# Patient Record
Sex: Female | Born: 1940
Health system: Southern US, Community
[De-identification: ages and names within clinical notes are randomized; demographics above are authoritative.]

## PROBLEM LIST (undated history)

## (undated) DIAGNOSIS — M4800 Spinal stenosis, site unspecified: Secondary | ICD-10-CM

## (undated) DIAGNOSIS — K219 Gastro-esophageal reflux disease without esophagitis: Secondary | ICD-10-CM

## (undated) DIAGNOSIS — I5032 Chronic diastolic (congestive) heart failure: Secondary | ICD-10-CM

## (undated) DIAGNOSIS — E785 Hyperlipidemia, unspecified: Secondary | ICD-10-CM

## (undated) DIAGNOSIS — C50919 Malignant neoplasm of unspecified site of unspecified female breast: Secondary | ICD-10-CM

## (undated) DIAGNOSIS — M1990 Unspecified osteoarthritis, unspecified site: Secondary | ICD-10-CM

## (undated) DIAGNOSIS — M109 Gout, unspecified: Secondary | ICD-10-CM

## (undated) DIAGNOSIS — E119 Type 2 diabetes mellitus without complications: Secondary | ICD-10-CM

## (undated) DIAGNOSIS — I10 Essential (primary) hypertension: Secondary | ICD-10-CM

## (undated) HISTORY — DX: Gastro-esophageal reflux disease without esophagitis: K21.9

## (undated) HISTORY — DX: Chronic diastolic (congestive) heart failure: I50.32

## (undated) HISTORY — PX: MASTECTOMY: SHX3

## (undated) HISTORY — DX: Hyperlipidemia, unspecified: E78.5

## (undated) HISTORY — PX: REPLACEMENT TOTAL KNEE: SUR1224

## (undated) HISTORY — PX: KNEE ARTHROSCOPY: SHX127

## (undated) HISTORY — PX: BREAST LUMPECTOMY: SHX2

## (undated) HISTORY — PX: ABDOMINAL HYSTERECTOMY: SHX81

## (undated) HISTORY — PX: BREAST BIOPSY: SHX20

## (undated) HISTORY — DX: Gout, unspecified: M10.9

## (undated) HISTORY — PX: ABDOMINAL SURGERY: SHX537

## (undated) HISTORY — PX: TUBAL LIGATION: SHX77

---

## 2005-11-30 DIAGNOSIS — C50919 Malignant neoplasm of unspecified site of unspecified female breast: Secondary | ICD-10-CM

## 2005-11-30 HISTORY — DX: Malignant neoplasm of unspecified site of unspecified female breast: C50.919

## 2005-11-30 LAB — HM COLONOSCOPY: HM Colonoscopy: 1

## 2010-09-17 ENCOUNTER — Emergency Department (HOSPITAL_BASED_OUTPATIENT_CLINIC_OR_DEPARTMENT_OTHER): Admission: EM | Admit: 2010-09-17 | Discharge: 2010-09-17 | Payer: Self-pay | Admitting: Emergency Medicine

## 2010-09-17 ENCOUNTER — Ambulatory Visit: Payer: Self-pay | Admitting: Diagnostic Radiology

## 2010-11-11 ENCOUNTER — Emergency Department (HOSPITAL_BASED_OUTPATIENT_CLINIC_OR_DEPARTMENT_OTHER)
Admission: EM | Admit: 2010-11-11 | Discharge: 2010-11-11 | Payer: Self-pay | Source: Home / Self Care | Admitting: Emergency Medicine

## 2011-02-10 LAB — URINALYSIS, ROUTINE W REFLEX MICROSCOPIC
Bilirubin Urine: NEGATIVE
Glucose, UA: NEGATIVE mg/dL
Hgb urine dipstick: NEGATIVE
Ketones, ur: NEGATIVE mg/dL
Nitrite: NEGATIVE
Protein, ur: NEGATIVE mg/dL
Specific Gravity, Urine: 1.006 (ref 1.005–1.030)
Urobilinogen, UA: 0.2 mg/dL (ref 0.0–1.0)
pH: 7 (ref 5.0–8.0)

## 2011-02-11 LAB — CBC
HCT: 41.3 % (ref 36.0–46.0)
Hemoglobin: 13.8 g/dL (ref 12.0–15.0)
MCH: 29.3 pg (ref 26.0–34.0)
MCHC: 33.5 g/dL (ref 30.0–36.0)
MCV: 87.3 fL (ref 78.0–100.0)
Platelets: 229 10*3/uL (ref 150–400)
RBC: 4.72 MIL/uL (ref 3.87–5.11)
RDW: 15.2 % (ref 11.5–15.5)
WBC: 10.1 10*3/uL (ref 4.0–10.5)

## 2011-02-11 LAB — GLUCOSE, CAPILLARY
Glucose-Capillary: 103 mg/dL — ABNORMAL HIGH (ref 70–99)
Glucose-Capillary: 68 mg/dL — ABNORMAL LOW (ref 70–99)

## 2011-02-11 LAB — D-DIMER, QUANTITATIVE: D-Dimer, Quant: 0.34 ug/mL-FEU (ref 0.00–0.48)

## 2011-02-11 LAB — DIFFERENTIAL
Basophils Absolute: 0.1 10*3/uL (ref 0.0–0.1)
Basophils Relative: 1 % (ref 0–1)
Eosinophils Absolute: 0.4 10*3/uL (ref 0.0–0.7)
Eosinophils Relative: 4 % (ref 0–5)
Lymphocytes Relative: 37 % (ref 12–46)
Lymphs Abs: 3.7 10*3/uL (ref 0.7–4.0)
Monocytes Absolute: 0.7 10*3/uL (ref 0.1–1.0)
Monocytes Relative: 7 % (ref 3–12)
Neutro Abs: 5.2 10*3/uL (ref 1.7–7.7)
Neutrophils Relative %: 51 % (ref 43–77)

## 2011-02-11 LAB — POCT CARDIAC MARKERS
CKMB, poc: <1 ng/mL — ABNORMAL LOW (ref 1.0–8.0)
CKMB, poc: <1 ng/mL — ABNORMAL LOW (ref 1.0–8.0)
Myoglobin, poc: 63.6 ng/mL (ref 12–200)
Myoglobin, poc: 67.7 ng/mL (ref 12–200)
Troponin i, poc: <0.05 ng/mL (ref 0.00–0.09)
Troponin i, poc: <0.05 ng/mL (ref 0.00–0.09)

## 2011-02-11 LAB — BASIC METABOLIC PANEL
BUN: 9 mg/dL (ref 6–23)
CO2: 29 mEq/L (ref 19–32)
Calcium: 9.9 mg/dL (ref 8.4–10.5)
Chloride: 105 mEq/L (ref 96–112)
Creatinine, Ser: 0.9 mg/dL (ref 0.4–1.2)
GFR calc Af Amer: >60 mL/min (ref >60)
GFR calc non Af Amer: >60 mL/min (ref >60)
Glucose, Bld: 71 mg/dL (ref 70–99)
Potassium: 4.1 mEq/L (ref 3.5–5.1)
Sodium: 145 mEq/L (ref 135–145)

## 2011-08-24 DIAGNOSIS — M25559 Pain in unspecified hip: Secondary | ICD-10-CM | POA: Insufficient documentation

## 2011-10-05 DIAGNOSIS — D0511 Intraductal carcinoma in situ of right breast: Secondary | ICD-10-CM | POA: Insufficient documentation

## 2011-12-01 LAB — HM DEXA SCAN

## 2013-05-30 LAB — HM DIABETES EYE EXAM

## 2013-10-09 LAB — HM MAMMOGRAPHY

## 2013-12-13 ENCOUNTER — Emergency Department (HOSPITAL_BASED_OUTPATIENT_CLINIC_OR_DEPARTMENT_OTHER)
Admission: EM | Admit: 2013-12-13 | Discharge: 2013-12-13 | Disposition: A | Discharge status: Home / Self Care | Payer: Medicare HMO | Attending: Emergency Medicine | Admitting: Emergency Medicine

## 2013-12-13 ENCOUNTER — Emergency Department (HOSPITAL_BASED_OUTPATIENT_CLINIC_OR_DEPARTMENT_OTHER): Payer: Medicare HMO

## 2013-12-13 ENCOUNTER — Encounter (HOSPITAL_BASED_OUTPATIENT_CLINIC_OR_DEPARTMENT_OTHER): Payer: Self-pay | Admitting: Emergency Medicine

## 2013-12-13 DIAGNOSIS — M129 Arthropathy, unspecified: Secondary | ICD-10-CM | POA: Insufficient documentation

## 2013-12-13 DIAGNOSIS — Z791 Long term (current) use of non-steroidal anti-inflammatories (NSAID): Secondary | ICD-10-CM | POA: Insufficient documentation

## 2013-12-13 DIAGNOSIS — M549 Dorsalgia, unspecified: Secondary | ICD-10-CM

## 2013-12-13 DIAGNOSIS — M5431 Sciatica, right side: Secondary | ICD-10-CM

## 2013-12-13 DIAGNOSIS — I10 Essential (primary) hypertension: Secondary | ICD-10-CM | POA: Insufficient documentation

## 2013-12-13 DIAGNOSIS — M7989 Other specified soft tissue disorders: Secondary | ICD-10-CM | POA: Insufficient documentation

## 2013-12-13 DIAGNOSIS — Z79899 Other long term (current) drug therapy: Secondary | ICD-10-CM | POA: Insufficient documentation

## 2013-12-13 DIAGNOSIS — M5430 Sciatica, unspecified side: Secondary | ICD-10-CM | POA: Insufficient documentation

## 2013-12-13 DIAGNOSIS — Z7982 Long term (current) use of aspirin: Secondary | ICD-10-CM | POA: Insufficient documentation

## 2013-12-13 DIAGNOSIS — Z853 Personal history of malignant neoplasm of breast: Secondary | ICD-10-CM | POA: Insufficient documentation

## 2013-12-13 DIAGNOSIS — E119 Type 2 diabetes mellitus without complications: Secondary | ICD-10-CM | POA: Insufficient documentation

## 2013-12-13 HISTORY — DX: Essential (primary) hypertension: I10

## 2013-12-13 HISTORY — DX: Malignant neoplasm of unspecified site of unspecified female breast: C50.919

## 2013-12-13 HISTORY — DX: Spinal stenosis, site unspecified: M48.00

## 2013-12-13 HISTORY — DX: Unspecified osteoarthritis, unspecified site: M19.90

## 2013-12-13 HISTORY — DX: Type 2 diabetes mellitus without complications: E11.9

## 2013-12-13 MED ORDER — OXYCODONE-ACETAMINOPHEN 5-325 MG PO TABS
2.0000 | ORAL_TABLET | Freq: Once | ORAL | Status: AC
  Administered 2013-12-13: 2 via ORAL
  Filled 2013-12-13: qty 2

## 2013-12-13 MED ORDER — OXYCODONE-ACETAMINOPHEN 5-325 MG PO TABS: 2.0000 | ORAL_TABLET | ORAL | Status: DC | PRN

## 2013-12-13 NOTE — ED Provider Notes (Signed)
CSN: [id number]     Arrival date & time 12/13/13  1631 History   First MD Initiated Contact with Patient 12/13/13 1727     Chief Complaint  Patient presents with  . Leg Pain   (Consider location/radiation/quality/duration/timing/severity/associated sxs/prior Treatment) Patient is a 73 y.o. female presenting with leg pain. The history is provided by the patient. No language interpreter was used.  Leg Pain Location:  Hip Injury: no   Hip location:  R hip Pain details:    Quality:  Aching   Radiates to:  Does not radiate   Severity:  Moderate   Onset quality:  Gradual   Duration:  1 month   Timing:  Constant   Progression:  Worsening Chronicity:  New Foreign body present:  No foreign bodies Relieved by:  Nothing Worsened by:  Nothing tried Associated symptoms: no back pain     Past Medical History  Diagnosis Date  . Hypertension   . Diabetes mellitus without complication   . Breast cancer   . Spinal stenosis   . Arthritis    Past Surgical History  Procedure Laterality Date  . Knee arthroscopy    . Abdominal hysterectomy    . Mastectomy      right side   No family history on file. History  Substance Use Topics  . Smoking status: Never Smoker   . Smokeless tobacco: Not on file  . Alcohol Use: No   OB History   Grav Para Term Preterm Abortions TAB SAB Ect Mult Living                 Review of Systems  Musculoskeletal: Positive for joint swelling. Negative for back pain.  All other systems reviewed and are negative.    Allergies  Sulfa antibiotics  Home Medications   Current Outpatient Rx  Name  Route  Sig  Dispense  Refill  . aspirin 81 MG tablet   Oral   Take 81 mg by mouth daily.         . benazepril (LOTENSIN) 20 MG tablet   Oral   Take 20 mg by mouth daily.         . cyclobenzaprine (FLEXERIL) 10 MG tablet   Oral   Take 10 mg by mouth 3 (three) times daily as needed for muscle spasms.         . hydrochlorothiazide (MICROZIDE) 12.5 MG  capsule   Oral   Take 12.5 mg by mouth daily.         Marland Kitchen HYDROcodone-acetaminophen (NORCO/VICODIN) 5-325 MG per tablet   Oral   Take 1 tablet by mouth every 6 (six) hours as needed for moderate pain.         . magnesium 30 MG tablet   Oral   Take 500 mg by mouth 2 (two) times daily.         . metFORMIN (GLUCOPHAGE) 500 MG tablet   Oral   Take 500 mg by mouth 2 (two) times daily with a meal.         . naproxen sodium (ANAPROX) 550 MG tablet   Oral   Take 550 mg by mouth 2 (two) times daily with a meal.         . omeprazole (PRILOSEC) 20 MG capsule   Oral   Take 20 mg by mouth daily.         Marland Kitchen oxyCODONE-acetaminophen (PERCOCET) 7.5-325 MG per tablet   Oral   Take 1 tablet by mouth every 4 (  four) hours as needed for pain.         . simvastatin (ZOCOR) 10 MG tablet   Oral   Take 10 mg by mouth daily.         . tamoxifen (NOLVADEX) 20 MG tablet   Oral   Take 20 mg by mouth daily.         . vitamin B-12 (CYANOCOBALAMIN) 100 MCG tablet   Oral   Take 100 mcg by mouth daily.          BP 162/66  Pulse 80  Temp(Src) 98.2 F (36.8 C) (Oral)  Resp 18  Ht 5\' 3"  (1.6 m)  Wt 200 lb (90.719 kg)  BMI 35.44 kg/m2  SpO2 100% Physical Exam  Nursing note and vitals reviewed. Constitutional: She appears well-developed and well-nourished.  HENT:  Head: Normocephalic and atraumatic.  Cardiovascular: Normal rate.   Pulmonary/Chest: Effort normal.  Abdominal: Soft.  Musculoskeletal: She exhibits tenderness.  Tender right hip and low back  Neurological: She is alert.  Skin: Skin is warm.  Psychiatric: She has a normal mood and affect.    ED Course  Procedures (including critical care time) Labs Review Labs Reviewed - No data to display Imaging Review Dg Lumbar Spine Complete  12/13/2013   CLINICAL DATA:  Low back pain which the patient dates to 11/10/2013. No recent injuries.  EXAM: LUMBAR SPINE - COMPLETE 4+ VIEW  COMPARISON:  None.  FINDINGS: Five non  rib-bearing lumbar vertebrae with anatomic alignment. Slight straightening of the usual lumbar lordosis. Severe disc space narrowing at L2-3, L3-4, L4-5 and L5-S1. Moderate disc space narrowing at L1-2. No fractures. Facet degenerative changes diffusely throughout the lumbar spine, worst at L5-S1. Visualized sacroiliac joints intact with mild degenerative changes.  IMPRESSION: No acute or subacute osseous abnormality. Degenerative disc disease at every lumbar level, severe at L2-3 through L5-S1 and moderate at L1-2. Diffuse facet degenerative changes.   Electronically Signed   By: Evangeline Dakin M.D.   On: 12/13/2013 18:33   Dg Hip Complete Right  12/13/2013   CLINICAL DATA:  Right hip pain.  No recent injuries.  EXAM: RIGHT HIP - COMPLETE 2+ VIEW  COMPARISON:  None.  FINDINGS: No evidence of acute or subacute fracture or dislocation. Mild protrusio deformity with mild joint space narrowing. Calcification adjacent to the superior rim of the acetabulum. No other intrinsic osseous abnormalities. Well preserved bone mineral density for age.  Included AP pelvis demonstrates symmetric mild protrusio deformity and joint space narrowing involving the contralateral left hip joint. Symphysis pubis intact. No pelvic fractures.  IMPRESSION: 1. No acute or subacute osseous abnormality. 2. Calcification adjacent to the superior rim of the acetabulum. Acetabular rim calcifications can be due to labral calcifications/ossification, acetabular rim stress fractures, or os acetabuli. Os acetabuli and acetabular rim stress fractures put the patient at increased risk for pincer type femoroacetabular impingement. 3. Mild protrusio deformity with mild joint space narrowing.   Electronically Signed   By: Evangeline Dakin M.D.   On: 12/13/2013 18:36    EKG Interpretation   None       MDM   1. Back pain   2. Sciatica of right side    [place], [place] 12/13/13 1955

## 2013-12-13 NOTE — ED Notes (Signed)
Patient has had pain since mid- Dec that starts in her right groin area and radiates down her right leg. Has seen her PCP

## 2013-12-13 NOTE — Discharge Instructions (Signed)
Sciatica °Sciatica is pain, weakness, numbness, or tingling along the path of the sciatic nerve. The nerve starts in the lower back and runs down the back of each leg. The nerve controls the muscles in the lower leg and in the back of the knee, while also providing sensation to the back of the thigh, lower leg, and the sole of your foot. Sciatica is a symptom of another medical condition. For instance, nerve damage or certain conditions, such as a herniated disk or bone spur on the spine, pinch or put pressure on the sciatic nerve. This causes the pain, weakness, or other sensations normally associated with sciatica. Generally, sciatica only affects one side of the body. °CAUSES  °· Herniated or slipped disc. °· Degenerative disk disease. °· A pain disorder involving the narrow muscle in the buttocks (piriformis syndrome). °· Pelvic injury or fracture. °· Pregnancy. °· Tumor (rare). °SYMPTOMS  °Symptoms can vary from mild to very severe. The symptoms usually travel from the low back to the buttocks and down the back of the leg. Symptoms can include: °· Mild tingling or dull aches in the lower back, leg, or hip. °· Numbness in the back of the calf or sole of the foot. °· Burning sensations in the lower back, leg, or hip. °· Sharp pains in the lower back, leg, or hip. °· Leg weakness. °· Severe back pain inhibiting movement. °These symptoms may get worse with coughing, sneezing, laughing, or prolonged sitting or standing. Also, being overweight may worsen symptoms. °DIAGNOSIS  °Your caregiver will perform a physical exam to look for common symptoms of sciatica. He or she may ask you to do certain movements or activities that would trigger sciatic nerve pain. Other tests may be performed to find the cause of the sciatica. These may include: °· Blood tests. °· X-rays. °· Imaging tests, such as an MRI or CT scan. °TREATMENT  °Treatment is directed at the cause of the sciatic pain. Sometimes, treatment is not necessary  and the pain and discomfort goes away on its own. If treatment is needed, your caregiver may suggest: °· Over-the-counter medicines to relieve pain. °· Prescription medicines, such as anti-inflammatory medicine, muscle relaxants, or narcotics. °· Applying heat or ice to the painful area. °· Steroid injections to lessen pain, irritation, and inflammation around the nerve. °· Reducing activity during periods of pain. °· Exercising and stretching to strengthen your abdomen and improve flexibility of your spine. Your caregiver may suggest losing weight if the extra weight makes the back pain worse. °· Physical therapy. °· Surgery to eliminate what is pressing or pinching the nerve, such as a bone spur or part of a herniated disk. °HOME CARE INSTRUCTIONS  °· Only take over-the-counter or prescription medicines for pain or discomfort as directed by your caregiver. °· Apply ice to the affected area for 20 minutes, 3 4 times a day for the first 48 72 hours. Then try heat in the same way. °· Exercise, stretch, or perform your usual activities if these do not aggravate your pain. °· Attend physical therapy sessions as directed by your caregiver. °· Keep all follow-up appointments as directed by your caregiver. °· Do not wear high heels or shoes that do not provide proper support. °· Check your mattress to see if it is too soft. A firm mattress may lessen your pain and discomfort. °SEEK IMMEDIATE MEDICAL CARE IF:  °· You lose control of your bowel or bladder (incontinence). °· You have increasing weakness in the lower back,   pelvis, buttocks, or legs. °· You have redness or swelling of your back. °· You have a burning sensation when you urinate. °· You have pain that gets worse when you lie down or awakens you at night. °· Your pain is worse than you have experienced in the past. °· Your pain is lasting longer than 4 weeks. °· You are suddenly losing weight without reason. °MAKE SURE YOU: °· Understand these  instructions. °· Will watch your condition. °· Will get help right away if you are not doing well or get worse. °Document Released: 11/10/2001 Document Revised: 05/17/2012 Document Reviewed: 03/27/2012 °ExitCare® Patient Information ©2014 [employer]. °Back Pain, Adult °Low back pain is very common. About 1 in 5 people have back pain. The cause of low back pain is rarely dangerous. The pain often gets better over time. About half of people with a sudden onset of back pain feel better in just 2 weeks. About 8 in 10 people feel better by 6 weeks.  °CAUSES °Some common causes of back pain include: °· Strain of the muscles or ligaments supporting the spine. °· Wear and tear (degeneration) of the spinal discs. °· Arthritis. °· Direct injury to the back. °DIAGNOSIS °Most of the time, the direct cause of low back pain is not known. However, back pain can be treated effectively even when the exact cause of the pain is unknown. Answering your caregiver's questions about your overall health and symptoms is one of the most accurate ways to make sure the cause of your pain is not dangerous. If your caregiver needs more information, he or she may order lab work or imaging tests (X-rays or MRIs). However, even if imaging tests show changes in your back, this usually does not require surgery. °HOME CARE INSTRUCTIONS °For many people, back pain returns. Since low back pain is rarely dangerous, it is often a condition that people can learn to manage on their own.  °· Remain active. It is stressful on the back to sit or stand in one place. Do not sit, drive, or stand in one place for more than 30 minutes at a time. Take short walks on level surfaces as soon as pain allows. Try to increase the length of time you walk each day. °· Do not stay in bed. Resting more than 1 or 2 days can delay your recovery. °· Do not avoid exercise or work. Your body is made to move. It is not dangerous to be active, even though your back may hurt. Your  back will likely heal faster if you return to being active before your pain is gone. °· Pay attention to your body when you  bend and lift. Many people have less discomfort when lifting if they bend their knees, keep the load close to their bodies, and avoid twisting. Often, the most comfortable positions are those that put less stress on your recovering back. °· Find a comfortable position to sleep. Use a firm mattress and lie on your side with your knees slightly bent. If you lie on your back, put a pillow under your knees. °· Only take over-the-counter or prescription medicines as directed by your caregiver. Over-the-counter medicines to reduce pain and inflammation are often the most helpful. Your caregiver may prescribe muscle relaxant drugs. These medicines help dull your pain so you can more quickly return to your normal activities and healthy exercise. °· Put ice on the injured area. °· Put ice in a plastic bag. °· Place a towel between your skin and the bag. °· Leave the ice on for 15-20 minutes, 03-04 times a day for   the first 2 to 3 days. After that, ice and heat may be alternated to reduce pain and spasms. °· Ask your caregiver about trying back exercises and gentle massage. This may be of some benefit. °· Avoid feeling anxious or stressed. Stress increases muscle tension and can worsen back pain. It is important to recognize when you are anxious or stressed and learn ways to manage it. Exercise is a great option. °SEEK MEDICAL CARE IF: °· You have pain that is not relieved with rest or medicine. °· You have pain that does not improve in 1 week. °· You have new symptoms. °· You are generally not feeling well. °SEEK IMMEDIATE MEDICAL CARE IF:  °· You have pain that radiates from your back into your legs. °· You develop new bowel or bladder control problems. °· You have unusual weakness or numbness in your arms or legs. °· You develop nausea or vomiting. °· You develop abdominal pain. °· You feel  faint. °Document Released: 11/16/2005 Document Revised: 05/17/2012 Document Reviewed: 04/06/2011 °ExitCare® Patient Information ©2014 [employer]. ° °

## 2013-12-14 NOTE — ED Provider Notes (Signed)
Medical screening examination/treatment/procedure(s) were performed by non-physician practitioner and as supervising physician I was immediately available for consultation/collaboration.  EKG Interpretation   None        Varney Biles, MD 12/14/13 0017

## 2014-01-05 ENCOUNTER — Encounter: Payer: Self-pay | Admitting: Neurology

## 2014-01-08 ENCOUNTER — Ambulatory Visit (INDEPENDENT_AMBULATORY_CARE_PROVIDER_SITE_OTHER): Payer: Medicare HMO | Admitting: Neurology

## 2014-01-08 ENCOUNTER — Encounter: Payer: Self-pay | Admitting: Neurology

## 2014-01-08 VITALS — BP 145/70 | HR 108 | Ht 62.75 in | Wt 199.0 lb

## 2014-01-08 DIAGNOSIS — R5381 Other malaise: Secondary | ICD-10-CM

## 2014-01-08 DIAGNOSIS — R531 Weakness: Secondary | ICD-10-CM

## 2014-01-08 DIAGNOSIS — IMO0002 Reserved for concepts with insufficient information to code with codable children: Secondary | ICD-10-CM

## 2014-01-08 DIAGNOSIS — M5416 Radiculopathy, lumbar region: Secondary | ICD-10-CM

## 2014-01-08 DIAGNOSIS — M79609 Pain in unspecified limb: Secondary | ICD-10-CM

## 2014-01-08 DIAGNOSIS — R5383 Other fatigue: Secondary | ICD-10-CM

## 2014-01-08 MED ORDER — PREGABALIN 75 MG PO CAPS: 75.0000 mg | ORAL_CAPSULE | Freq: Two times a day (BID) | ORAL | Status: DC

## 2014-01-08 NOTE — Progress Notes (Signed)
[facility]    Provider:  Dr Janann Colonel Referring Provider: Loraine Leriche.,* Primary Care Physician:  Idamae Schuller, MD  CC:  Back pain  HPI:  Donna Fuller is a 73 y.o. female here as a referral from Dr. Theodis Sato for back pain evaluation  History of back pain since 2005, at that time diagnosed with lumbar stenosis. The doctor talked about doing surgery at that time but they decided to hold off. Currently now having exacerbation since December 2014, current symptoms consist of a burning sharp pain in right lumbar region, right groin, right thigh (medial and lateral). Notes some weakness too, having trouble flexing the hip. Having trouble standing up, gets severe lumbar back pain when standing. No distal sensory changes. Symptoms came on suddenly, initially felt pain and then developed weakness. Has some numbness in her groin area. No change in bowel/bladder. Describes pain as currently a "10/10". Has been diagnosed with sciatica by her PCP, states it just makes her feel tired but does not give any relief.   Review of Systems: Out of a complete 14 system review, the patient complains of only the following symptoms, and all other reviewed systems are negative. Positive back pain  History   Social History  . Marital Status: Married    Spouse Name: [name]    Number of Children: 3  . Years of Education: 12   Occupational History  . Not on file.   Social History Main Topics  . Smoking status: Former Research scientist (life sciences)  . Smokeless tobacco: Not on file  . Alcohol Use: No  . Drug Use: Not on file  . Sexual Activity: Not on file   Other Topics Concern  . Not on file   Social History Narrative   Patient is married to [name]), she has 3 children   Patient is right handed   Education level is 12th    Caffeine consumption is 2 cups daily    Family History  Problem Relation Age of Onset  . Diabetes      family history  . Heart disease      Past Medical  History  Diagnosis Date  . Hypertension   . Diabetes mellitus without complication   . Breast cancer   . Spinal stenosis   . Arthritis   . Dysuria   . Other malignant neoplasm without specification of site     Past Surgical History  Procedure Laterality Date  . Knee arthroscopy    . Abdominal hysterectomy    . Mastectomy      right side  . Breast lumpectomy    . Tubal ligation      Current Outpatient Prescriptions  Medication Sig Dispense Refill  . Artificial Tear Solution (SYSTANE CONTACTS) SOLN Apply to eye.      Marland Kitchen aspirin 81 MG tablet Take 81 mg by mouth daily.      . benazepril (LOTENSIN) 20 MG tablet Take 20 mg by mouth daily.      . Cholecalciferol (VITAMIN D3) 2000 UNITS TABS Take 2,000 Units by mouth.      . cyclobenzaprine (FLEXERIL) 10 MG tablet Take 10 mg by mouth 3 (three) times daily as needed for muscle spasms.      Marland Kitchen glucose blood (FREESTYLE INSULINX TEST) test strip 1 each by Other route as needed for other. Use as instructed      . hydrochlorothiazide (MICROZIDE) 12.5 MG capsule Take 12.5 mg by mouth daily.      Marland Kitchen HYDROcodone-acetaminophen (NORCO/VICODIN) 5-325 MG per tablet  Take 1 tablet by mouth every 6 (six) hours as needed for moderate pain.      Marland Kitchen insulin glargine (LANTUS) 100 UNIT/ML injection Inject 100 Units into the skin at bedtime.      . metFORMIN (GLUCOPHAGE) 500 MG tablet Take 500 mg by mouth 2 (two) times daily with a meal.      . naproxen sodium (ANAPROX) 550 MG tablet Take 550 mg by mouth 2 (two) times daily with a meal.      . nystatin (MYCOSTATIN) 100000 UNIT/ML suspension Take 5 mLs by mouth 4 (four) times daily.      Marland Kitchen omeprazole (PRILOSEC) 20 MG capsule Take 20 mg by mouth daily.      Marland Kitchen oxyCODONE-acetaminophen (PERCOCET/ROXICET) 5-325 MG per tablet Take 2 tablets by mouth every 4 (four) hours as needed for severe pain.  20 tablet  0  . simvastatin (ZOCOR) 10 MG tablet Take 10 mg by mouth daily.      . tamoxifen (NOLVADEX) 20 MG tablet Take  20 mg by mouth daily.      . vitamin B-12 (CYANOCOBALAMIN) 100 MCG tablet Take 100 mcg by mouth daily.       No current facility-administered medications for this visit.    Allergies as of 01/08/2014 - Review Complete 01/08/2014  Allergen Reaction Noted  . Sulfa antibiotics  12/13/2013    Vitals: BP 145/70  Pulse 108  Ht 5' 2.75" (1.594 m)  Wt 199 lb (90.266 kg)  BMI 35.53 kg/m2 Last Weight:  Wt Readings from Last 1 Encounters:  01/08/14 199 lb (90.266 kg)   Last Height:   Ht Readings from Last 1 Encounters:  01/08/14 5' 2.75" (1.594 m)     Physical exam: Exam: Gen: NAD, conversant Eyes: anicteric sclerae, moist conjunctivae HENT: Atraumatic, oropharynx clear Neck: Trachea midline; supple,  Lungs: CTA, no wheezing, rales, rhonic                          CV: RRR, no MRG Abdomen: Soft, non-tender;  Extremities: No peripheral edema  Skin: Normal temperature, no rash, tenderness to palpation over lumbar region Psych: Appropriate affect, pleasant  Neuro: MS: AA&Ox3, appropriately interactive, normal affect   Speech: fluent w/o paraphasic error  Memory: good recent and remote recall  CN: PERRL, EOMI no nystagmus, no ptosis, sensation intact to LT V1-V3 bilat, face symmetric, no weakness, hearing grossly intact, palate elevates symmetrically, shoulder shrug 5/5 bilat,  tongue protrudes midline, no fasiculations noted.  Motor: normal bulk and tone Strength: R hip flexor 4+/5 (likekly pain related) otherwise 5/5 strength in all extremities  Coord: rapid alternating and point-to-point (FNF, HTS) movements intact.  Reflexes: diminished bilateral patellar reflexes, downgoing toes  Sens: LT intact in all extremities  Gait: posture, stance, stride and arm-swing normal. Tandem gait intact. Able to walk on heels and toes. Romberg absent.   Assessment:  After physical and neurologic examination, review of laboratory studies, imaging, neurophysiology testing and  pre-existing records, assessment will be reviewed on the problem list.  Plan:  Treatment plan and additional workup will be reviewed under Problem List.  1)Lumbar radiculopathy  73y/o woman presenting for initial evaluation of lumbar back pain radiating down RLE. Based on patients description appears to be a L2 lumbar radiculopathy. Will check MRI L spine, consider EMG/NCS in the future. Will d/c gabapentin and start Lyrica 75mg  twice a day. Follow up once MRI completed.    Jim Like, DO  [facility]  [address] [place], [place] [address]  Phone [phone] Fax [phone]

## 2014-01-08 NOTE — Patient Instructions (Signed)
Overall you are doing fairly well but I do want to suggest a few things today:   Remember to drink plenty of fluid, eat healthy meals and do not skip any meals. Try to eat protein with a every meal and eat a healthy snack such as fruit or nuts in between meals. Try to keep a regular sleep-wake schedule and try to exercise daily, particularly in the form of walking, 20-30 minutes a day, if you can.   As far as your medications are concerned, I would like to suggest the following: 1)Please discontinue the gabapentin 2)We will start Lyrica 75mg  twice a day  As far as diagnostic testing:  1)We will check a MRI of the lumbar spine  I would like to see you back once the MRI is completed, sooner if we need to. Please call us with any interim questions, concerns, problems, updates or refill requests.   My clinical assistant and will answer any of your questions and relay your messages to me and also relay most of my messages to you.   Our phone number is [phone]. We also have an after hours call service for urgent matters and there is a physician on-call for urgent questions. For any emergencies you know to call 911 or go to the nearest emergency room

## 2014-01-15 ENCOUNTER — Telehealth: Payer: Self-pay | Admitting: *Deleted

## 2014-01-15 ENCOUNTER — Other Ambulatory Visit: Payer: Self-pay | Admitting: Neurology

## 2014-01-15 ENCOUNTER — Telehealth: Payer: Self-pay | Admitting: Neurology

## 2014-01-15 MED ORDER — GABAPENTIN 300 MG PO CAPS: 300.0000 mg | ORAL_CAPSULE | Freq: Three times a day (TID) | ORAL | Status: DC

## 2014-01-15 NOTE — Telephone Encounter (Signed)
[facility] has been removed from chart and Rx has been resent to [facility] per patient request.

## 2014-01-15 NOTE — Telephone Encounter (Signed)
Called patient back, will discontinue Lyrica and switch to Gabapentin 300mg  TID.

## 2014-01-15 NOTE — Telephone Encounter (Signed)
Patient calling to state that her Lyrica that she was prescribed is making her feel drained, and she can't get her thoughts together. Patient states it is making her feel "like she's somewhere else" and it's not working for her. Please call the patient and advise.

## 2014-01-20 ENCOUNTER — Inpatient Hospital Stay: Admission: RE | Admit: 2014-01-20 | Payer: Medicare HMO | Source: Ambulatory Visit

## 2014-01-20 ENCOUNTER — Ambulatory Visit
Admission: RE | Admit: 2014-01-20 | Discharge: 2014-01-20 | Disposition: A | Discharge status: Home / Self Care | Payer: Medicare HMO | Source: Ambulatory Visit | Attending: Neurology | Admitting: Neurology

## 2014-01-20 DIAGNOSIS — R531 Weakness: Secondary | ICD-10-CM

## 2014-01-20 DIAGNOSIS — M79609 Pain in unspecified limb: Secondary | ICD-10-CM

## 2014-01-23 ENCOUNTER — Telehealth: Payer: Self-pay | Admitting: Neurology

## 2014-01-23 DIAGNOSIS — M5136 Other intervertebral disc degeneration, lumbar region: Secondary | ICD-10-CM

## 2014-01-23 NOTE — Telephone Encounter (Signed)
Called patient to discuss MRI results. Discussed different treatment options, including PT vs ortho surgery referral for possible injections/surigcal intervention. Will proceed with PT referral.

## 2014-02-09 ENCOUNTER — Ambulatory Visit (INDEPENDENT_AMBULATORY_CARE_PROVIDER_SITE_OTHER): Payer: Medicare HMO | Admitting: Family Medicine

## 2014-02-09 ENCOUNTER — Other Ambulatory Visit: Payer: Self-pay | Admitting: *Deleted

## 2014-02-09 ENCOUNTER — Encounter: Payer: Self-pay | Admitting: Family Medicine

## 2014-02-09 VITALS — BP 148/75 | HR 93 | Resp 16 | Wt 193.0 lb

## 2014-02-09 DIAGNOSIS — R300 Dysuria: Secondary | ICD-10-CM

## 2014-02-09 DIAGNOSIS — M549 Dorsalgia, unspecified: Secondary | ICD-10-CM

## 2014-02-09 DIAGNOSIS — I10 Essential (primary) hypertension: Secondary | ICD-10-CM

## 2014-02-09 DIAGNOSIS — E119 Type 2 diabetes mellitus without complications: Secondary | ICD-10-CM

## 2014-02-09 LAB — POCT URINALYSIS DIPSTICK
Bilirubin, UA: NEGATIVE
Blood, UA: NEGATIVE
Glucose, UA: NEGATIVE
Ketones, UA: NEGATIVE
Leukocytes, UA: NEGATIVE
Nitrite, UA: NEGATIVE
Protein, UA: NEGATIVE
Spec Grav, UA: 1.02
Urobilinogen, UA: NEGATIVE
pH, UA: 5

## 2014-02-09 MED ORDER — BENAZEPRIL-HYDROCHLOROTHIAZIDE 20-12.5 MG PO TABS: 1.0000 | ORAL_TABLET | Freq: Every day | ORAL | Status: DC

## 2014-02-09 MED ORDER — SITAGLIPTIN PHOSPHATE 100 MG PO TABS: 100.0000 mg | ORAL_TABLET | Freq: Every day | ORAL | Status: DC

## 2014-02-09 MED ORDER — CYCLOBENZAPRINE HCL 10 MG PO TABS: 10.0000 mg | ORAL_TABLET | Freq: Every day | ORAL | Status: DC

## 2014-02-09 NOTE — Progress Notes (Signed)
Subjective:    Patient ID: Donna Fuller, female    DOB: [birth year], 73 y.o.   MRN: [id number]  HPI  Donna Fuller is here today with her husband [name]) to establish care.  Her daughter Brittie Whisnant) referred her to our practice.  She used to receive care at [facility] by Dr. Amalia Hailey who is leaving her practice so she needs to get a new PCP.  She would like to discuss the condition listed below:   1)  Urinary Problems - She feels that her bladder remains full.  She has had this problem for the past three weeks.  She feels some occasional  discomfort before urinating.  She has tried increasing her fluid intake but she feels that her bladder does not empty completely.     2)  Type II DM - She is taking a combination of Lantus and metformin. She would like to come off of insulin.    3)  HTN - Her BP is a little elevated on her current medication.     Review of Systems  Constitutional: Negative for activity change, fatigue and unexpected weight change.  HENT: Negative.   Eyes: Negative.   Respiratory: Negative for shortness of breath.   Cardiovascular: Negative for chest pain, palpitations and leg swelling.  Gastrointestinal: Negative for diarrhea and constipation.  Endocrine: Negative.   Genitourinary: Positive for dysuria, urgency and frequency. Negative for difficulty urinating.  Musculoskeletal: Negative.   Skin: Negative.   Neurological: Negative.   Hematological: Negative for adenopathy. Does not bruise/bleed easily.  Psychiatric/Behavioral: Negative for sleep disturbance and dysphoric mood. The patient is not nervous/anxious.      Past Medical History  Diagnosis Date  . Diabetes mellitus without complication   . Breast cancer   . Spinal stenosis   . Arthritis   . Dysuria   . Other malignant neoplasm without specification of site   . Hyperlipidemia   . Hypertension      Past Surgical History  Procedure Laterality Date  . Knee arthroscopy    . Abdominal  hysterectomy    . Mastectomy      right side  . Breast lumpectomy    . Tubal ligation       History   Social History Narrative   Marital Status:  Married Manufacturing systems engineer)    Children:  G3 Daughters (01) Son (01)    Pets:  None    Living Situation: Lives with husband and daughter      Occupation:  Retired - Quarry manager    Education: 12th Grade    Tobacco Use/Exposure:  She used to smoke socially during the weekends but quit 40 years ago.    Alcohol Use:  Occasional   Drug Use:  None   Diet:  Regular   Exercise:  None   Hobbies:  Reading and playing volleyball               Family History  Problem Relation Age of Onset  . Diabetes Mother   . Heart disease Father   . Hyperlipidemia Father   . Kidney disease Father      Current Outpatient Prescriptions on File Prior to Visit  Medication Sig Dispense Refill  . Artificial Tear Solution (SYSTANE CONTACTS) SOLN Apply to eye.      Marland Kitchen aspirin 81 MG tablet Take 81 mg by mouth daily.      Marland Kitchen gabapentin (NEURONTIN) 300 MG capsule Take 1 capsule (300 mg total) by mouth 3 (three) times daily.  90 capsule  6  . hydrochlorothiazide (MICROZIDE) 12.5 MG capsule Take 12.5 mg by mouth daily.      . insulin glargine (LANTUS) 100 UNIT/ML injection Inject 100 Units into the skin at bedtime.      . metFORMIN (GLUCOPHAGE) 500 MG tablet Take 500 mg by mouth 2 (two) times daily with a meal.      . naproxen sodium (ANAPROX) 550 MG tablet Take 550 mg by mouth 2 (two) times daily with a meal.      . omeprazole (PRILOSEC) 20 MG capsule Take 20 mg by mouth daily.      . simvastatin (ZOCOR) 10 MG tablet Take 10 mg by mouth daily.      . tamoxifen (NOLVADEX) 20 MG tablet Take 20 mg by mouth daily.      . vitamin B-12 (CYANOCOBALAMIN) 100 MCG tablet Take 100 mcg by mouth daily.       No current facility-administered medications on file prior to visit.     Allergies  Allergen Reactions  . Sulfa Antibiotics        Objective:   Physical Exam  Nursing note and  vitals reviewed. Constitutional: She is oriented to person, place, and time.  Eyes: Conjunctivae are normal. No scleral icterus.  Neck: Neck supple. No thyromegaly present.  Cardiovascular: Normal rate, regular rhythm and normal heart sounds.   Pulmonary/Chest: Effort normal and breath sounds normal.  Musculoskeletal: She exhibits no edema and no tenderness.  Lymphadenopathy:    She has no cervical adenopathy.  Neurological: She is alert and oriented to person, place, and time.  Skin: Skin is warm and dry.  Psychiatric: She has a normal mood and affect. Her behavior is normal. Judgment and thought content normal.      Assessment & Plan:   Donna Fuller was seen today for establish care.  Diagnoses and associated orders for this visit:  Dysuria Comments: Her urine appears to be fine. We'll get her to an urologist to see if she needs to have her urethra stretched.   - POCT urinalysis dipstick  Essential hypertension, benign - benazepril-hydrochlorthiazide (LOTENSIN HCT) 20-12.5 MG per tablet; Take 1 tablet by mouth daily.  Back pain - cyclobenzaprine (FLEXERIL) 10 MG tablet; Take 1 tablet (10 mg total) by mouth at bedtime.  Type II or unspecified type diabetes mellitus without mention of complication, not stated as uncontrolled Comments: She is to start Januvia and decrease her Lantus and get The End of Diabetes.   - sitaGLIPtin (JANUVIA) 100 MG tablet; Take 1 tablet (100 mg total) by mouth daily.   TIME SPENT "FACE TO FACE" WITH PATIENT -  30 MINS

## 2014-02-09 NOTE — Patient Instructions (Signed)
1)  Type II DM - Add one Januvia daily and decrease your insulin 10 units every 5 days or so.  Continue on the metformin.  Get the book "The End of Diabetes"  Dr. Excell Seltzer. You may also want to watch the movie "Fat, Sick and Nearly Dead".    2)  Urinary Symptoms - We are getting you to Dr. Thomasene Mohair to check out your bladder.    How to Avoid Diabetes Problems You can do a lot to prevent or slow down diabetes problems. Following your diabetes plan and taking care of yourself can reduce your risk of serious or life-threatening complications. Below, you will find certain things you can do to prevent diabetes problems. MANAGE YOUR DIABETES Follow your caregiver's, nurse educator's, and dietitian's instructions for managing your diabetes. They will teach you the basics of diabetes care. They can help answer questions you may have. Learn about diabetes and make healthy choices regarding eating and physical activity. Monitor your blood glucose level regularly. Your caregiver will help you decide how often to check your blood glucose level depending on your treatment goals and how well you are meeting them.  DO NOT SMOKE Smoking and diabetes are a dangerous combination. Smoking raises your risk for diabetes problems. If you quit smoking, you will lower your risk for heart attack, stroke, nerve disease, and kidney disease. Your cholesterol and your blood pressure levels may improve. Your blood circulation will also improve. If you smoke, ask your caregiver for help in quitting. KEEP YOUR BLOOD PRESSURE UNDER CONTROL Keeping your blood pressure under control will help prevent damage to your eyes, kidneys, heart, and blood vessels. Blood pressure consists of two numbers. The top number should be below 120, and the bottom number should be below 80 (120/80). Keep your blood pressure as close to these numbers as you can. If you already have kidney disease, you may want even lower blood pressure to protect your  kidneys. Talk to your caregiver to make sure that your blood pressure goal is right for your needs. Meal planning, medicines, and exercise can help you reach your blood pressure target. Have your blood pressure checked at every visit with your caregiver. KEEP YOUR CHOLESTEROL UNDER CONTROL Normal cholesterol levels will help prevent heart disease and stroke. These are the biggest health problems for people with diabetes. Keeping cholesterol levels under control can also help with blood flow. Have your cholesterol level checked at least once a year. Meal planning, exercise, and medicines can help you reach your cholesterol targets. SCHEDULE AND KEEP YOUR ANNUAL PHYSICAL EXAMS AND EYE EXAMS Your caregiver will tell you how often he or she wants to see you depending on your plan of treatment. It is important that you keep these appointments so that possible problems can be identified early and complications can be avoided or treated.  Every visit with your caregiver should include your weight, blood pressure, and an evaluation of your blood glucose control.  Your hemoglobin A1c should be checked:  At least twice a year if you are at your goal.  Every 3 months if there are changes in treatment.  If you are not meeting your goals.  Your blood lipids should be checked yearly. You should also be checked yearly to see if you have protein in your urine (microalbumin).  Schedule a dilated eye exam if you have type 1 diabetes within 5 years of your diagnosis and then yearly. Schedule a dilated eye exam if you have type 2 diabetes  at diagnosis and then yearly. All exams thereafter can be extended to every 2 to 3 years if one or more exams have been normal. KEEP YOUR VACCINES CURRENT The flu vaccine is recommended yearly. The formula for the vaccine changes every year and needs to be updated for the best protection against current viruses. In addition, you should get a vaccination against pneumonia at least  once in your life. However, there are some instances where another vaccine is recommended. Check with your caregiver. TAKE CARE OF YOUR FEET  Diabetes may cause you to have a poor blood supply (circulation) to your legs and feet. Because of this, the skin may be thinner, break easier, and heal more slowly. You also may have nerve damage in your legs and feet causing decreased feeling. You may not notice minor injuries to your feet that could lead to serious problems or infections. Taking care of your feet is very important. Visual foot exams are performed at every routine medical visit. The exams check for cuts, injuries, or other problems with the feet. A comprehensive foot exam should be done yearly. This includes visual inspection as well as assessing foot pulses and testing for loss of sensation. You should also do the following:  Inspect your feet daily for cuts, calluses, blisters, ingrown toenails, and signs of infection, such as redness, swelling, or pus.  Wash and dry your feet thoroughly, especially between the toes.  Avoid soaking your feet regularly in hot water baths.  Moisturize dry skin with lotion, avoiding areas between your toes.  Cut toenails straight across and file the edges.  Avoid shoes that do not fit well or have areas that irritate your skin.  Avoid going barefooted or wearing only socks. Your feet need protection. TAKE CARE OF YOUR TEETH People with poorly controlled diabetes are more likely to have gum (periodontal) disease. These infections make diabetes harder to control. Periodontal diseases, if left untreated, can lead to tooth loss. Brush your teeth twice a day, floss, and see your dentist for checkups and cleaning every 6 months, or 2 times a year. ASK YOUR CAREGIVER ABOUT TAKING ASPIRIN Taking aspirin daily is recommended to help prevent cardiovascular disease in people with and without diabetes. Ask your caregiver if this would benefit you and what dose he or  she would recommend. DRINK RESPONSIBLY Moderate amounts of alcohol (less than 1 drink per day for adult women and less than 2 drinks per day for adult men) have a minimal effect on blood glucose if ingested with food. It is important to eat food with alcohol to avoid hypoglycemia. People should avoid alcohol if they have a history of alcohol abuse or dependence, if they are pregnant, and if they have liver disease, pancreatitis, advanced neuropathy, or severe hypertriglyceridemia. LESSEN STRESS Living with diabetes can be stressful. When you are under stress, your blood glucose may be affected in two ways:  Stress hormones may cause your blood glucose to rise.  You may be distracted from taking good care of yourself. It is a good idea to be aware of your stress level and make changes that are necessary to help you better manage challenging situations. Support groups, planned relaxation, a hobby you enjoy, meditation, healthy relationships, and exercise all work to lower your stress level. If your efforts do not seem to be helping, get help from your caregiver or a trained mental health professional. Document Released: 08/04/2011 Document Revised: 11/02/2012 Document Reviewed: 08/04/2011 [facility] Patient Information 2014 [place].

## 2014-02-20 ENCOUNTER — Other Ambulatory Visit: Payer: Self-pay | Admitting: *Deleted

## 2014-02-20 MED ORDER — GLUCOSE BLOOD VI STRP: ORAL_STRIP | Status: DC

## 2014-03-15 ENCOUNTER — Ambulatory Visit: Payer: Medicare HMO | Admitting: Family Medicine

## 2014-03-21 ENCOUNTER — Encounter: Payer: Self-pay | Admitting: Family Medicine

## 2014-03-21 ENCOUNTER — Ambulatory Visit (INDEPENDENT_AMBULATORY_CARE_PROVIDER_SITE_OTHER): Payer: Medicare HMO | Admitting: Family Medicine

## 2014-03-21 VITALS — BP 148/71 | HR 86 | Resp 16 | Wt 196.0 lb

## 2014-03-21 DIAGNOSIS — E119 Type 2 diabetes mellitus without complications: Secondary | ICD-10-CM

## 2014-03-21 DIAGNOSIS — I10 Essential (primary) hypertension: Secondary | ICD-10-CM | POA: Insufficient documentation

## 2014-03-21 LAB — POCT GLYCOSYLATED HEMOGLOBIN (HGB A1C): Hemoglobin A1C: 6

## 2014-03-21 MED ORDER — SITAGLIP PHOS-METFORMIN HCL ER 50-1000 MG PO TB24: 1.0000 | ORAL_TABLET | Freq: Two times a day (BID) | ORAL | Status: DC

## 2014-03-21 NOTE — Progress Notes (Signed)
Subjective:    Patient ID: Donna Fuller, female    DOB: 03-Jan-1941, 73 y.o.   MRN: [id number]  HPI  Donna Fuller is here today to discuss her current treatment for Type II DM.  She is currently taking her Januvia and Metformin.  She is also has been decreasing her insuling and currently is injecting about 20 IU daily.  She has been monitoring her blood glucose levels and has brought her readings. She feels that decreasing her Insulin has caused her sugar levels to increase.    Review of Systems  Constitutional: Negative for activity change, appetite change, fatigue and unexpected weight change.  HENT: Negative.   Eyes: Negative.   Respiratory: Negative for chest tightness and shortness of breath.   Cardiovascular: Negative for chest pain, palpitations and leg swelling.  Gastrointestinal: Negative for diarrhea and constipation.  Endocrine: Negative.  Negative for polydipsia, polyphagia and polyuria.  Genitourinary: Negative for urgency, frequency and difficulty urinating.  Musculoskeletal: Negative.   Skin: Negative.   Neurological: Negative.  Negative for light-headedness and numbness.  Hematological: Negative for adenopathy. Does not bruise/bleed easily.  Psychiatric/Behavioral: Negative for sleep disturbance and dysphoric mood. The patient is not nervous/anxious.      Past Medical History  Diagnosis Date  . Diabetes mellitus without complication   . Breast cancer   . Spinal stenosis   . Arthritis   . Dysuria   . Other malignant neoplasm without specification of site   . Hyperlipidemia   . Hypertension      Past Surgical History  Procedure Laterality Date  . Knee arthroscopy    . Abdominal hysterectomy    . Mastectomy      right side  . Breast lumpectomy    . Tubal ligation       History   Social History Narrative   Marital Status:  Married Manufacturing systems engineer)    Children:  Daughter(1) Son (1)    Pets:  None    Living Situation: Lives with husband and daughter.     Occupation:  Retired Licensed conveyancer)    Education: 12th Grade    Tobacco Use/Exposure:  She used to smoke socially during the weekends but quit 40 years ago.    Alcohol Use:  Occasional   Drug Use:  None   Diet:  Regular   Exercise:  None   Hobbies:  Reading and playing volleyball                  Family History  Problem Relation Age of Onset  . Diabetes Mother   . Heart disease Father   . Hyperlipidemia Father   . Kidney disease Father      Current Outpatient Prescriptions on File Prior to Visit  Medication Sig Dispense Refill  . Artificial Tear Solution (SYSTANE CONTACTS) SOLN Apply to eye.      . benazepril-hydrochlorthiazide (LOTENSIN HCT) 20-12.5 MG per tablet Take 1 tablet by mouth daily.  90 tablet  3  . Cholecalciferol (VITAMIN D3) 5000 UNITS CAPS Take by mouth.      . cyclobenzaprine (FLEXERIL) 10 MG tablet Take 1 tablet (10 mg total) by mouth at bedtime.  30 tablet  3  . gabapentin (NEURONTIN) 300 MG capsule Take 1 capsule (300 mg total) by mouth 3 (three) times daily.  90 capsule  6  . glucose blood (FREESTYLE INSULINX TEST) test strip Use as instructed  100 each  11  . hydrochlorothiazide (MICROZIDE) 12.5 MG capsule Take 12.5 mg by mouth daily.      Marland Kitchen  insulin glargine (LANTUS) 100 UNIT/ML injection Inject 100 Units into the skin at bedtime.      Marland Kitchen LEVEMIR 100 UNIT/ML injection       . metFORMIN (GLUCOPHAGE) 500 MG tablet Take 500 mg by mouth 2 (two) times daily with a meal.      . naproxen sodium (ANAPROX) 550 MG tablet Take 550 mg by mouth 2 (two) times daily with a meal.      . omeprazole (PRILOSEC) 20 MG capsule Take 20 mg by mouth daily.      . simvastatin (ZOCOR) 10 MG tablet Take 10 mg by mouth daily.      . sitaGLIPtin (JANUVIA) 100 MG tablet Take 1 tablet (100 mg total) by mouth daily.  30 tablet  1  . tamoxifen (NOLVADEX) 20 MG tablet Take 20 mg by mouth daily.      . vitamin B-12 (CYANOCOBALAMIN) 100 MCG tablet Take 100 mcg by mouth daily.      Marland Kitchen aspirin 81 MG  tablet Take 81 mg by mouth daily.       No current facility-administered medications on file prior to visit.     Allergies  Allergen Reactions  . Sulfa Antibiotics        Objective:   Physical Exam  Constitutional: She appears well-nourished. No distress.  HENT:  Head: Normocephalic.  Eyes: No scleral icterus.  Neck: No thyromegaly present.  Cardiovascular: Normal rate, regular rhythm and normal heart sounds.   Pulmonary/Chest: Effort normal and breath sounds normal.  Abdominal: There is no tenderness.  Musculoskeletal: She exhibits no edema and no tenderness.  Neurological: She is alert.  Skin: Skin is warm and dry.  Psychiatric: She has a normal mood and affect. Her behavior is normal. Judgment and thought content normal.      Assessment & Plan:    Donna Fuller was seen today for medication management.  Diagnoses and associated orders for this visit:  Type II or unspecified type diabetes mellitus without mention of complication, not stated as uncontrolled - POCT glycosylated hemoglobin (Hb A1C) - Discontinue: SitaGLIPtin-MetFORMIN HCl 50-1000 MG TB24; Take 1 tablet by mouth 2 (two) times daily.

## 2014-03-21 NOTE — Patient Instructions (Signed)
1)  Type II DM - Your A1c is very good at 6.0%.  To help you achieve your goal of coming off of shots, increase the metformin to 2000 mg per day (2 pills 2 x per day) and take along with the Januvia.  Once your finish this combination and your bottle of metformin is gone, then you'll switch over to Janumet XR 50/1000 - take 1 pill twice a day.  Continue to work on Dr. Dimas Chyle diet and getting as much exercise as you can.    2)  Blood Pressure - Your number is still a little elevated.  The goal is for you to be <140/<90.  The DASH diet helps to lower BP and you want to limit your sodium intake to <3000 mg per day.     DASH Diet The DASH diet stands for "Dietary Approaches to Stop Hypertension." It is a healthy eating plan that has been shown to reduce high blood pressure (hypertension) in as little as 14 days, while also possibly providing other significant health benefits. These other health benefits include reducing the risk of breast cancer after menopause and reducing the risk of type 2 diabetes, heart disease, colon cancer, and stroke. Health benefits also include weight loss and slowing kidney failure in patients with chronic kidney disease.  DIET GUIDELINES  Limit salt (sodium). Your diet should contain less than 1500 mg of sodium daily.  Limit refined or processed carbohydrates. Your diet should include mostly whole grains. Desserts and added sugars should be used sparingly.  Include small amounts of heart-healthy fats. These types of fats include nuts, oils, and tub margarine. Limit saturated and trans fats. These fats have been shown to be harmful in the body. CHOOSING FOODS  The following food groups are based on a 2000 calorie diet. See your Registered Dietitian for individual calorie needs. Grains and Grain Products (6 to 8 servings daily)  Eat More Often: Whole-wheat bread, brown rice, whole-grain or wheat pasta, quinoa, popcorn without added fat or salt (air popped).  Eat Less  Often: White bread, white pasta, white rice, cornbread. Vegetables (4 to 5 servings daily)  Eat More Often: Fresh, frozen, and canned vegetables. Vegetables may be raw, steamed, roasted, or grilled with a minimal amount of fat.  Eat Less Often/Avoid: Creamed or fried vegetables. Vegetables in a cheese sauce. Fruit (4 to 5 servings daily)  Eat More Often: All fresh, canned (in natural juice), or frozen fruits. Dried fruits without added sugar. One hundred percent fruit juice ( cup [237 mL] daily).  Eat Less Often: Dried fruits with added sugar. Canned fruit in light or heavy syrup. [employer], Fish, and Poultry (2 servings or less daily. One serving is 3 to 4 oz [85-114 g]).  Eat More Often: Ninety percent or leaner ground beef, tenderloin, sirloin. Round cuts of beef, chicken breast, [place] breast. All fish. Grill, bake, or broil your meat. Nothing should be fried.  Eat Less Often/Avoid: Fatty cuts of meat, [place], or chicken leg, thigh, or wing. Fried cuts of meat or fish. Dairy (2 to 3 servings)  Eat More Often: Low-fat or fat-free milk, low-fat plain or light yogurt, reduced-fat or part-skim cheese.  Eat Less Often/Avoid: Milk (whole, 2%).Whole milk yogurt. Full-fat cheeses. Nuts, Seeds, and Legumes (4 to 5 servings per week)  Eat More Often: All without added salt.  Eat Less Often/Avoid: Salted nuts and seeds, canned beans with added salt. Fats and Sweets (limited)  Eat More Often: Vegetable oils, tub margarines without  trans fats, sugar-free gelatin. Mayonnaise and salad dressings.  Eat Less Often/Avoid: Coconut oils, palm oils, butter, stick margarine, cream, half and half, cookies, candy, pie. FOR MORE INFORMATION The Dash Diet Eating Plan: www.dashdiet.org Document Released: 11/05/2011 Document Revised: 02/08/2012 Document Reviewed: 11/05/2011 [facility] Patient Information 2014 [place], [place].  Sodium-Controlled Diet Sodium is a mineral. It is found in many foods.  Sodium may be found naturally or added during the making of a food. The most common form of sodium is salt, which is made up of sodium and chloride. Reducing your sodium intake involves changing your eating habits. The following guidelines will help you reduce the sodium in your diet:  Stop using the salt shaker.  Use salt sparingly in cooking and baking.  Substitute with sodium-free seasonings and spices.  Do not use a salt substitute (potassium chloride) without your caregiver's permission.  Include a variety of fresh, unprocessed foods in your diet.  Limit the use of processed and convenience foods that are high in sodium. USE THE FOLLOWING FOODS SPARINGLY: Breads/Starches  Commercial bread stuffing, commercial pancake or waffle mixes, coating mixes. Waffles. Croutons. Prepared (boxed or frozen) potato, rice, or noodle mixes that contain salt or sodium. Salted [place] fries or hash browns. Salted popcorn, breads, crackers, chips, or snack foods. Vegetables  Vegetables canned with salt or prepared in cream, butter, or cheese sauces. Sauerkraut. Tomato or vegetable juices canned with salt.  Fresh vegetables are allowed if rinsed thoroughly. Fruit  Fruit is okay to eat. Meat and Meat Substitutes  Salted or smoked meats, such as bacon or Canadian bacon, chipped or corned beef, hot dogs, salt pork, luncheon meats, pastrami, ham, or sausage. Canned or smoked fish, poultry, or meat. Processed cheese or cheese spreads, blue or [place]ef[place] cheese. Battered or frozen fish products. Prepared spaghetti sauce. Baked beans. Reuben sandwiches. Salted nuts. Caviar. Milk  Limit buttermilk to 1 cup per week. Soups and Combination Foods  Bouillon cubes, canned or dried soups, broth, consomm. Convenience (frozen or packaged) dinners with more than 600 mg sodium. Pot pies, pizza, Asian food, fast food cheeseburgers, and specialty sandwiches. Desserts and Sweets  Regular (salted) desserts, pie,  commercial fruit snack pies, commercial snack cakes, canned puddings.  Eat desserts and sweets in moderation. Fats and Oils  Gravy mixes or canned gravy. No more than 1 to 2 tbs of salad dressing. Chip dips.  Eat fats and oils in moderation. Beverages  See those listed under the vegetables and milk groups. Condiments  Ketchup, mustard, meat sauces, salsa, regular (salted) and lite soy sauce or mustard. Dill pickles, olives, meat tenderizer. Prepared horseradish or pickle relish. Dutch-processed cocoa. Baking powder or baking soda used medicinally. Worcestershire sauce. "Light" salt. Salt substitute, unless approved by your caregiver. Document Released: 05/08/2002 Document Revised: 02/08/2012 Document Reviewed: 12/09/2009 [facility] Patient Information 2014 [place], [place].

## 2014-05-07 ENCOUNTER — Other Ambulatory Visit: Payer: Self-pay | Admitting: *Deleted

## 2014-05-07 DIAGNOSIS — E119 Type 2 diabetes mellitus without complications: Secondary | ICD-10-CM

## 2014-05-07 MED ORDER — SITAGLIP PHOS-METFORMIN HCL ER 50-1000 MG PO TB24: 1.0000 | ORAL_TABLET | Freq: Two times a day (BID) | ORAL | Status: DC

## 2014-05-28 ENCOUNTER — Telehealth: Payer: Self-pay

## 2014-05-28 NOTE — Telephone Encounter (Signed)
Donna Fuller needs her prescription for Naproxen sodium 550 mg, but she said she original got this medicine from her previous dr. I told her I did not think we could fill it if Dr Dion Saucier has not filled it before.

## 2014-06-09 ENCOUNTER — Encounter: Payer: Self-pay | Admitting: Family Medicine

## 2014-06-20 ENCOUNTER — Ambulatory Visit (INDEPENDENT_AMBULATORY_CARE_PROVIDER_SITE_OTHER): Payer: Medicare HMO | Admitting: Family Medicine

## 2014-06-20 ENCOUNTER — Encounter: Payer: Self-pay | Admitting: Family Medicine

## 2014-06-20 VITALS — BP 146/79 | HR 96 | Resp 16 | Ht 62.75 in | Wt 193.0 lb

## 2014-06-20 DIAGNOSIS — E1165 Type 2 diabetes mellitus with hyperglycemia: Secondary | ICD-10-CM

## 2014-06-20 DIAGNOSIS — I10 Essential (primary) hypertension: Secondary | ICD-10-CM

## 2014-06-20 DIAGNOSIS — IMO0001 Reserved for inherently not codable concepts without codable children: Secondary | ICD-10-CM

## 2014-06-20 DIAGNOSIS — E785 Hyperlipidemia, unspecified: Secondary | ICD-10-CM

## 2014-06-20 LAB — POCT GLYCOSYLATED HEMOGLOBIN (HGB A1C): Hemoglobin A1C: 7.6

## 2014-06-20 MED ORDER — DAPAGLIFLOZIN PROPANEDIOL 10 MG PO TABS: 1.0000 | ORAL_TABLET | Freq: Every day | ORAL | Status: DC

## 2014-06-20 MED ORDER — INSULIN DETEMIR 100 UNIT/ML ~~LOC~~ SOLN: 70.0000 [IU] | Freq: Every day | SUBCUTANEOUS | Status: DC

## 2014-06-20 MED ORDER — CANAGLIFLOZIN 300 MG PO TABS: 300.0000 mg | ORAL_TABLET | Freq: Every day | ORAL | Status: DC

## 2014-06-20 MED ORDER — SITAGLIP PHOS-METFORMIN HCL ER 50-1000 MG PO TB24: 1.0000 | ORAL_TABLET | Freq: Two times a day (BID) | ORAL | Status: DC

## 2014-06-20 NOTE — Progress Notes (Signed)
Subjective:    Patient ID: Donna Fuller, female    DOB: Apr 20, 1941, 73 y.o.   MRN: [id number]  HPI  Donna Fuller is here today for medication refills and to discuss the following:    1)  Hypertension:  She is doing well on the Benazepril/HCTZ.  2)  Type II DM:  She is using the Levemir and Janumet. She is doing well on this combination.  3)   GERD: Controlled on the omeprazole.   4)  Hyperlipidemia:  She is doing well on the simvastatin.    Review of Systems  Constitutional: Negative for activity change, appetite change, fatigue and unexpected weight change.  HENT: Negative.   Eyes: Negative.   Respiratory: Negative for shortness of breath.   Cardiovascular: Negative for chest pain, palpitations and leg swelling.  Gastrointestinal: Negative for diarrhea and constipation.  Endocrine: Negative.  Negative for polydipsia, polyphagia and polyuria.  Genitourinary: Negative for difficulty urinating.  Musculoskeletal: Negative.   Skin: Negative.   Neurological: Negative.   Hematological: Negative for adenopathy. Does not bruise/bleed easily.  Psychiatric/Behavioral: Negative for sleep disturbance and dysphoric mood. The patient is not nervous/anxious.   All other systems reviewed and are negative.    Past Medical History  Diagnosis Date  . Diabetes mellitus without complication   . Breast cancer   . Spinal stenosis   . Arthritis   . Dysuria   . Other malignant neoplasm without specification of site   . Hyperlipidemia   . Hypertension      Past Surgical History  Procedure Laterality Date  . Knee arthroscopy    . Abdominal hysterectomy    . Mastectomy      right side  . Breast lumpectomy    . Tubal ligation       History   Social History Narrative   Marital Status:  Married Manufacturing systems engineer)    Children:  Daughter(1) Son (1)    Pets:  None    Living Situation: Lives with husband and daughter.     Occupation:  Retired Licensed conveyancer)    Education: 12th Grade    Tobacco  Use/Exposure:  She used to smoke socially during the weekends but quit 40 years ago.    Alcohol Use:  Occasional   Drug Use:  None   Diet:  Regular   Exercise:  None   Hobbies:  Reading and playing volleyball                  Family History  Problem Relation Age of Onset  . Diabetes Mother   . Heart disease Father   . Hyperlipidemia Father   . Kidney disease Father      Current Outpatient Prescriptions on File Prior to Visit  Medication Sig Dispense Refill  . Artificial Tear Solution (SYSTANE CONTACTS) SOLN Apply to eye.      Marland Kitchen aspirin 81 MG tablet Take 81 mg by mouth daily.      . benazepril-hydrochlorthiazide (LOTENSIN HCT) 20-12.5 MG per tablet Take 1 tablet by mouth daily.  90 tablet  3  . Cholecalciferol (VITAMIN D3) 5000 UNITS CAPS Take by mouth.      . cyclobenzaprine (FLEXERIL) 10 MG tablet Take 1 tablet (10 mg total) by mouth at bedtime.  30 tablet  3  . gabapentin (NEURONTIN) 300 MG capsule Take 1 capsule (300 mg total) by mouth 3 (three) times daily.  90 capsule  6  . glucose blood (FREESTYLE INSULINX TEST) test strip Use as instructed  100 each  11  . naproxen sodium (ANAPROX) 550 MG tablet Take 550 mg by mouth 2 (two) times daily with a meal.      . omeprazole (PRILOSEC) 20 MG capsule Take 20 mg by mouth daily.      . simvastatin (ZOCOR) 10 MG tablet Take 10 mg by mouth daily.      . tamoxifen (NOLVADEX) 20 MG tablet Take 20 mg by mouth daily.      . vitamin B-12 (CYANOCOBALAMIN) 100 MCG tablet Take 100 mcg by mouth daily.       No current facility-administered medications on file prior to visit.     Allergies  Allergen Reactions  . Sulfa Antibiotics        Objective:   Physical Exam  Vitals reviewed. Constitutional: She appears well-nourished. No distress.  HENT:  Head: Normocephalic.  Eyes: No scleral icterus.  Neck: No thyromegaly present.  Cardiovascular: Normal rate, regular rhythm and normal heart sounds.   Pulmonary/Chest: Effort normal and  breath sounds normal.  Abdominal: There is no tenderness.  Musculoskeletal: She exhibits no edema and no tenderness.  Neurological: She is alert.  Skin: Skin is warm and dry.  Psychiatric: She has a normal mood and affect. Her behavior is normal. Judgment and thought content normal.      Assessment & Plan:    Donna Fuller was seen today for follow-up.  Diagnoses and associated orders for this visit:  Essential hypertension, benign Comments: Her BP is elevated.  She is to limit her sodium intake.    Other and unspecified hyperlipidemia Comments: She is not having any trouble with simvastatin.    Type II or unspecified type diabetes mellitus without mention of complication, uncontrolled - POCT HgB A1C - SitaGLIPtin-MetFORMIN HCl (JANUMET XR) 50-1000 MG TB24; Take 1 tablet by mouth 2 (two) times daily. - insulin detemir (LEVEMIR) 100 UNIT/ML injection; Inject 0.7 mLs (70 Units total) into the skin daily. - Dapagliflozin Propanediol (FARXIGA) 10 MG TABS; Take 1 tablet by mouth daily. - Canagliflozin (INVOKANA) 300 MG TABS; Take 1 tablet (300 mg total) by mouth daily.

## 2014-06-20 NOTE — Patient Instructions (Signed)
1)  Blood Sugar - We need to get you down.  We're changing you to the Janumet XR take 1 pill twice a day.  We'll add the Farxiga 5 mg daily for 1 month then increase to 10 mg (remember to take 2 of the 5s).  Hopefully, you won't have to increase your insulin to get your first morning sugar closer to 100.  We'll recheck your A1c in 3 months.  Try to follow Dr. Dimas Chyle book "The End of Diabetes".      Diabetes and Exercise Exercising regularly is important. It is not just about losing weight. It has many health benefits, such as:  Improving your overall fitness, flexibility, and endurance.  Increasing your bone density.  Helping with weight control.  Decreasing your body fat.  Increasing your muscle strength.  Reducing stress and tension.  Improving your overall health. People with diabetes who exercise gain additional benefits because exercise:  Reduces appetite.  Improves the body's use of blood sugar (glucose).  Helps lower or control blood glucose.  Decreases blood pressure.  Helps control blood lipids (such as cholesterol and triglycerides).  Improves the body's use of the hormone insulin by:  Increasing the body's insulin sensitivity.  Reducing the body's insulin needs.  Decreases the risk for heart disease because exercising:  Lowers cholesterol and triglycerides levels.  Increases the levels of good cholesterol (such as high-density lipoproteins [HDL]) in the body.  Lowers blood glucose levels. YOUR ACTIVITY PLAN  Choose an activity that you enjoy and set realistic goals. Your health care provider or diabetes educator can help you make an activity plan that works for you. You can break activities into 2 or 3 sessions throughout the day. Doing so is as good as one long session. Exercise ideas include:  Taking the dog for a walk.  Taking the stairs instead of the elevator.  Dancing to your favorite song.  Doing your favorite exercise with a  friend. RECOMMENDATIONS FOR EXERCISING WITH TYPE 1 OR TYPE 2 DIABETES   Check your blood glucose before exercising. If blood glucose levels are greater than 240 mg/dL, check for urine ketones. Do not exercise if ketones are present.  Avoid injecting insulin into areas of the body that are going to be exercised. For example, avoid injecting insulin into:  The arms when playing tennis.  The legs when jogging.  Keep a record of:  Food intake before and after you exercise.  Expected peak times of insulin action.  Blood glucose levels before and after you exercise.  The type and amount of exercise you have done.  Review your records with your health care provider. Your health care provider will help you to develop guidelines for adjusting food intake and insulin amounts before and after exercising.  If you take insulin or oral hypoglycemic agents, watch for signs and symptoms of hypoglycemia. They include:  Dizziness.  Shaking.  Sweating.  Chills.  Confusion.  Drink plenty of water while you exercise to prevent dehydration or heat stroke. Body water is lost during exercise and must be replaced.  Talk to your health care provider before starting an exercise program to make sure it is safe for you. Remember, almost any type of activity is better than none. Document Released: 02/06/2004 Document Revised: 07/19/2013 Document Reviewed: 04/25/2013 [facility] Patient Information 2015 [place], [place]. This information is not intended to replace advice given to you by your health care provider. Make sure you discuss any questions you have with your health care provider.

## 2014-11-07 ENCOUNTER — Telehealth: Payer: Self-pay | Admitting: Neurology

## 2014-11-07 MED ORDER — GABAPENTIN 300 MG PO CAPS
300.0000 mg | ORAL_CAPSULE | Freq: Three times a day (TID) | ORAL | Status: DC
Start: 2014-11-07 — End: 2014-12-12

## 2014-11-07 NOTE — Telephone Encounter (Signed)
Patient is calling to get a refill on Gabapentin called to [facility] in [facility].  Patient was a patient of Dr. Janann Colonel. Thank you.

## 2014-11-07 NOTE — Telephone Encounter (Signed)
Auth 1 refill via [facility].  I called back.  Spoke with patient.  She is aware refill has been sent and will contact us to be assigned a new provider and schedule an appt.

## 2014-12-12 ENCOUNTER — Encounter: Payer: Self-pay | Admitting: Neurology

## 2014-12-12 ENCOUNTER — Ambulatory Visit (INDEPENDENT_AMBULATORY_CARE_PROVIDER_SITE_OTHER): Payer: PPO | Admitting: Neurology

## 2014-12-12 VITALS — BP 151/69 | HR 73 | Ht 62.75 in | Wt 192.6 lb

## 2014-12-12 DIAGNOSIS — G609 Hereditary and idiopathic neuropathy, unspecified: Secondary | ICD-10-CM

## 2014-12-12 DIAGNOSIS — E538 Deficiency of other specified B group vitamins: Secondary | ICD-10-CM

## 2014-12-12 MED ORDER — GABAPENTIN 300 MG PO CAPS: 600.0000 mg | ORAL_CAPSULE | Freq: Three times a day (TID) | ORAL | Status: DC

## 2014-12-12 NOTE — Patient Instructions (Signed)
Overall you are doing fairly well but I do want to suggest a few things today:   Remember to drink plenty of fluid, eat healthy meals and do not skip any meals. Try to eat protein with a every meal and eat a healthy snack such as fruit or nuts in between meals. Try to keep a regular sleep-wake schedule and try to exercise daily, particularly in the form of walking, 20-30 minutes a day, if you can.   As far as your medications are concerned, I would like to suggest: Continue Neurontin. Can slowly increase to 2 tablets (300mg ) three times a day as we discussed. Can take 3 tablets before bed if needed.   As far as diagnostic testing: EMG/NCS  I would like to see you back in 3 months, sooner if we need to. Please call us with any interim questions, concerns, problems, updates or refill requests.   Please also call us for any test results so we can go over those with you on the phone.  My clinical assistant and will answer any of your questions and relay your messages to me and also relay most of my messages to you.   Our phone number is [phone]. We also have an after hours call service for urgent matters and there is a physician on-call for urgent questions. For any emergencies you know to call 911 or go to the nearest emergency room

## 2014-12-12 NOTE — Progress Notes (Signed)
[facility]    Provider:  Dr Jaynee Eagles Referring Provider: Jonathon Resides, MD Primary Care Physician:  Ival Bible, MD  CC:  LBP   HPI:  Donna Fuller is a 74 y.o. female here as a follow up for low back pain. She is a former Dr. Janann Colonel patient. In 2005 she was diaggnosed with spinal stenosis and last year Dxed with sciatica. The pain is unbearable. The pain radiates from the left back to the side of the left thigh, has hamstring cramping. Worse when sitting for long periods also when walking, she gets cramping. Weakness in the left leg. Progressive and daily, pain is 10/10. Pain wakes her up at night. She has to walk flexed over. Lyrica made her feel funny. The gabapentin works. It doesn't completely help but it helps a little. Her hands are aching the palm of her left hand and tips of digits 2-5. Left hand worse than the right hand. Worsening in the last 6 months. In the middle of the night, pain in her hands wake her up. She tries to move around her left hand to stop the pain, more painful at night. She wakes up with pain too. She is getting cramping in the fingers and weakness in the fingers. They feel numb and painful. Has neck pain.   Appointment Feburary 9, 2015:   Donna Fuller is a 74 y.o. female here as a referral from Dr. Theodis Sato for back pain evaluation  History of back pain since 2005, at that time diagnosed with lumbar stenosis. The doctor talked about doing surgery at that time but they decided to hold off. Currently now having exacerbation since December 2014, current symptoms consist of a burning sharp pain in right lumbar region, right groin, right thigh (medial and lateral). Notes some weakness too, having trouble flexing the hip. Having trouble standing up, gets severe lumbar back pain when standing. No distal sensory changes. Symptoms came on suddenly, initially felt pain and then developed weakness. Has some numbness in her groin area. No change in  bowel/bladder. Describes pain as currently a "10/10". Has been diagnosed with sciatica by her PCP, states it just makes her feel tired but does not give any relief.   Reviewed notes, labs and imaging from outside physicians, which showed: MRI of the lumbar spine showed prominent spondylitic changes throughout most severe at L2-3 where there is asymmetric facet hypertrophy and disc osteophyte protrusion to the left resulting in severe left-sided foraminal narrowing and possible encroachment on the L2 nerve root. There is also mild left-sided foraminal narrowing at L3-4 and L4-5 but without definite compression. Personally reviewed images, agree with results. HgbA1c 7.6.   Review of Systems: Patient complains of symptoms per HPI as well as the following symptoms: activity change, fatigue, ringing in ears, constipation, frequent wakening, joint pain, joint swelling, back pain, aching muscles, muscle cramps, moles, itching. Pertinent negatives per HPI. All others negative.   History   Social History  . Marital Status: Married    Spouse Name: [name]    Number of Children: 3  . Years of Education: 12   Occupational History  . Not on file.   Social History Main Topics  . Smoking status: Former Research scientist (life sciences)  . Smokeless tobacco: Never Used  . Alcohol Use: No  . Drug Use: No  . Sexual Activity: Not Currently   Other Topics Concern  . Not on file   Social History Narrative   Marital Status:  Married Manufacturing systems engineer)    Children:  Daughter(1) Son (1)    Pets:  None    Living Situation: Lives with husband and daughter.     Occupation:  Retired Licensed conveyancer)    Education: 12th Grade    Tobacco Use/Exposure:  She used to smoke socially during the weekends but quit 40 years ago.    Alcohol Use:  Occasional   Drug Use:  None   Diet:  Regular   Exercise:  None   Hobbies:  Reading and playing volleyball                 Family History  Problem Relation Age of Onset  . Diabetes Mother   . Heart disease Father    . Hyperlipidemia Father   . Kidney disease Father     Past Medical History  Diagnosis Date  . Diabetes mellitus without complication   . Breast cancer   . Spinal stenosis   . Arthritis   . Dysuria   . Other malignant neoplasm without specification of site   . Hyperlipidemia   . Hypertension     Past Surgical History  Procedure Laterality Date  . Knee arthroscopy    . Abdominal hysterectomy    . Mastectomy      right side  . Breast lumpectomy    . Tubal ligation      Current Outpatient Prescriptions  Medication Sig Dispense Refill  . Artificial Tear Solution (SYSTANE CONTACTS) SOLN Apply to eye.    Marland Kitchen aspirin 81 MG tablet Take 81 mg by mouth daily.    . benazepril-hydrochlorthiazide (LOTENSIN HCT) 20-12.5 MG per tablet Take 1 tablet by mouth daily. 90 tablet 3  . Cholecalciferol (VITAMIN D3) 5000 UNITS CAPS Take by mouth.    . cyclobenzaprine (FLEXERIL) 10 MG tablet Take 1 tablet (10 mg total) by mouth at bedtime. 30 tablet 3  . furosemide (LASIX) 20 MG tablet Take 1 tab po in the morning for increased swelling    . gabapentin (NEURONTIN) 300 MG capsule Take 1 capsule (300 mg total) by mouth 3 (three) times daily. 90 capsule 0  . glucose blood (FREESTYLE INSULINX TEST) test strip Use as instructed 100 each 11  . insulin detemir (LEVEMIR) 100 UNIT/ML injection Inject 0.7 mLs (70 Units total) into the skin daily. 30 mL 5  . latanoprost (XALATAN) 0.005 % ophthalmic solution     . metFORMIN (GLUCOPHAGE-XR) 500 MG 24 hr tablet Take 500 mg by mouth.    . naproxen sodium (ANAPROX) 550 MG tablet Take 550 mg by mouth 2 (two) times daily with a meal.    . omeprazole (PRILOSEC) 20 MG capsule Take 20 mg by mouth daily.    . potassium chloride SA (K-DUR,KLOR-CON) 20 MEQ tablet Take 1 tablet po with the Lasix    . simvastatin (ZOCOR) 10 MG tablet Take 10 mg by mouth daily.    . SitaGLIPtin-MetFORMIN HCl (JANUMET XR) 50-1000 MG TB24 Take 1 tablet by mouth 2 (two) times daily. 30 tablet 5   . tamoxifen (NOLVADEX) 20 MG tablet Take 20 mg by mouth daily.    . vitamin B-12 (CYANOCOBALAMIN) 100 MCG tablet Take 100 mcg by mouth daily.     No current facility-administered medications for this visit.    Allergies as of 12/12/2014 - Review Complete 12/12/2014  Allergen Reaction Noted  . Sulfa antibiotics Swelling 12/13/2013    Vitals: BP 151/69 mmHg  Pulse 73  Ht 5' 2.75" (1.594 m)  Wt 192 lb 9.6 oz (87.363 kg)  BMI 34.38 kg/m2  Last Weight:  Wt Readings from Last 1 Encounters:  12/12/14 192 lb 9.6 oz (87.363 kg)   Last Height:   Ht Readings from Last 1 Encounters:  12/12/14 5' 2.75" (1.594 m)   Physical exam: Exam: Gen: NAD, conversant, well nourised, overweight, well groomed                     CV: RRR, no MRG. No Carotid Bruits. No peripheral edema, warm, nontender Eyes: Conjunctivae clear without exudates or hemorrhage  Neuro: Detailed Neurologic Exam  Speech:    Speech is normal; fluent and spontaneous with normal comprehension.  Cognition:    The patient is oriented to person, place, and time;     recent and remote memory intact;     language fluent;     normal attention, concentration,     fund of knowledge Cranial Nerves:    The pupils are equal, round, and reactive to light. Cannot visualize fundi due to small pupils.. Visual fields are full to finger confrontation. Extraocular movements are intact. Trigeminal sensation is intact and the muscles of mastication are normal. The face is symmetric. The palate elevates in the midline. Hearing intact. Voice is normal. Shoulder shrug is normal. The tongue has normal motion without fasciculations.   Coordination:    No dysmetria  Gait:    antalgic gait  Motor Observation:    No asymmetry, no atrophy, and no involuntary movements noted. Tone:    Not increased  Posture:    Mildly stooped    Strength:     Bilateral hip flexion weakness and mild bilat APB weakness.       Sensation:     Decreased to  pp and temp in a glove and stocking distribution Reflex Exam:  DTR's:    Absent patellars (bilat replacements), trace achilles  Toes:    The toes are downgoing bilaterally.   Clonus:    Clonus is absent.      Assessment/Plan:  74 year old female here for evaluation of lumbar radiculopathy and paresthesias in her hands as well as neuropathy.  Radiculopathy: Chronic problem. Continue the neurontin. Can increase neurontin as discussed at appointment. Hand paresthesias with cramping: EMG/NCS to evaluate for CTS vs radic vs neuropathy Neuropathy: Will order serum neuropathy labs. Last HgbA1c 7.6, she needs close follow up with pcp for management. Uncontrolled glucose is the most common cause of neuropathy.   Sarina Ill, MD  [facility] [address] [place], [place] [address]  Phone [phone] Fax [phone]  A total of 45 minutes was spent in with this patient. Over half this time was spent on counseling patient on the diagnosis and different therapeutic options available.

## 2014-12-14 LAB — HEAVY METALS SCREEN, URINE
Arsenic Ur: NOT DETECTED ug/L (ref 0–50)
Arsenic(Inorganic),U: NOT DETECTED ug/L (ref 0–19)
Creatinine(Crt),U: 0.45 g/L (ref 0.30–3.00)
Lead, Rand Ur: NOT DETECTED ug/L (ref 0–49)
Mercury, Ur: NOT DETECTED ug/L (ref 0–19)

## 2014-12-14 LAB — RHEUMATOID FACTOR: Rhuematoid fact SerPl-aCnc: 11.1 IU/mL (ref 0.0–13.9)

## 2014-12-14 LAB — IFE AND PE, SERUM
Albumin SerPl Elph-Mcnc: 3.7 g/dL (ref 3.2–5.6)
Albumin/Glob SerPl: 1.1 (ref 0.7–2.0)
Alpha 1: 0.4 g/dL (ref 0.1–0.4)
Alpha2 Glob SerPl Elph-Mcnc: 0.8 g/dL (ref 0.4–1.2)
B-Globulin SerPl Elph-Mcnc: 1.1 g/dL (ref 0.6–1.3)
Gamma Glob SerPl Elph-Mcnc: 1.1 g/dL (ref 0.5–1.6)
Globulin, Total: 3.4 g/dL (ref 2.0–4.5)
IgA/Immunoglobulin A, Serum: 196 mg/dL (ref 91–414)
IgG (Immunoglobin G), Serum: 1100 mg/dL (ref 700–1600)
IgM (Immunoglobulin M), Srm: 73 mg/dL (ref 40–230)
Total Protein: 7.1 g/dL (ref 6.0–8.5)

## 2014-12-14 LAB — ANA W/REFLEX: Anti Nuclear Antibody(ANA): NEGATIVE

## 2014-12-14 LAB — ANGIOTENSIN CONVERTING ENZYME: Angio Convert Enzyme: <14 U/L — ABNORMAL LOW (ref 14–82)

## 2014-12-14 LAB — TSH: TSH: 1.35 u[IU]/mL (ref 0.450–4.500)

## 2014-12-14 LAB — B12 AND FOLATE PANEL
Folate: 13.9 ng/mL (ref >3.0)
Vitamin B-12: >1999 pg/mL — ABNORMAL HIGH (ref 211–946)

## 2014-12-14 LAB — RPR: RPR Ser Ql: NONREACTIVE

## 2014-12-14 LAB — HEPATITIS C ANTIBODY: Hep C Virus Ab: <0.1 s/co ratio (ref 0.0–0.9)

## 2014-12-20 ENCOUNTER — Telehealth: Payer: Self-pay | Admitting: *Deleted

## 2014-12-20 NOTE — Telephone Encounter (Signed)
Left patient a message that her labs were normal.

## 2014-12-20 NOTE — Telephone Encounter (Signed)
-----   Message from Melvenia Beam, MD sent at 12/19/2014 10:39 AM EST ----- Please let patient know her labs were normal. thanks

## 2014-12-31 ENCOUNTER — Ambulatory Visit (INDEPENDENT_AMBULATORY_CARE_PROVIDER_SITE_OTHER): Payer: PPO | Admitting: Neurology

## 2014-12-31 ENCOUNTER — Other Ambulatory Visit: Payer: Self-pay | Admitting: Neurology

## 2014-12-31 ENCOUNTER — Ambulatory Visit (INDEPENDENT_AMBULATORY_CARE_PROVIDER_SITE_OTHER): Payer: Self-pay | Admitting: Neurology

## 2014-12-31 DIAGNOSIS — G609 Hereditary and idiopathic neuropathy, unspecified: Secondary | ICD-10-CM

## 2014-12-31 DIAGNOSIS — Z0289 Encounter for other administrative examinations: Secondary | ICD-10-CM

## 2014-12-31 DIAGNOSIS — G5603 Carpal tunnel syndrome, bilateral upper limbs: Secondary | ICD-10-CM

## 2014-12-31 MED ORDER — GABAPENTIN 300 MG PO CAPS: 1200.0000 mg | ORAL_CAPSULE | Freq: Three times a day (TID) | ORAL | Status: DC

## 2014-12-31 NOTE — Progress Notes (Signed)
  [facility]    Provider:  Dr Jaynee Eagles Referring Provider: Jonathon Resides, MD Primary Care Physician:  Ival Bible, MD  HPI:  Donna Fuller is a 74 y.o. female here for evaluation of bilateral hand pain. She reports hand paresthesias with cramping left > right. Wearing wrist braces while sleeping has helped. She feels weakness in her grip.   Summary:   Left median motor nerve showed delayed distal onset latency(4.21ms, N<4.63ms) with normal F Response latency Left 2nd-digit Median sensory nerve showed delayed distal peak latency(4.9ms, N<3.9)   Right median motor nerve showed delayed distal onset latency(4.68ms, N<4.77ms) with normal F Response latency Right 2nd-digit Median sensory nerve showed delayed distal peak latency(4.68ms, N<3.9)    Bilateral Ulnar ADM motor nerves were within normal limits with normal F response latencies.  Bilateral Ulnar 5th digit sensory nerves were within normal limits.   The left Opponens Pollicis showed increased motor unit amplitude and reduced motor unit recruitment. Needle evaluation of the following muscles were within normal limits: left Deltoid, left  Triceps, left Pronator teres, left First Dorsal Interosseous(could not activate due to pain), left C6/C7 paraspinal muscles. Patient declined further emg needle exam.   Conclusion: There is electrophysiologic evidence for moderately-severe left>right Carpal Tunnel Syndrome. No suggestion of polyneuropathy or cervical radiculopathy.  Sarina Ill, MD  [facility] [address] [place], [place] [address]  Phone [phone] Fax [phone]

## 2015-01-01 NOTE — Progress Notes (Signed)
See procedure note.

## 2015-01-01 NOTE — Procedures (Signed)
[facility]    Provider:  Dr Jaynee Eagles Referring Provider: Jonathon Resides, MD Primary Care Physician:  Ival Bible, MD  HPI:  Donna Fuller is a 74 y.o. female here for evaluation of bilateral hand pain. She reports hand paresthesias with cramping left > right. Wearing wrist braces while sleeping has helped. She feels weakness in her grip.   Summary:   Left median motor nerve showed delayed distal onset latency(4.62ms, N<4.73ms) with normal F Response latency Left 2nd-digit Median sensory nerve showed delayed distal peak latency(4.21ms, N<3.9)   Right median motor nerve showed delayed distal onset latency(4.21ms, N<4.72ms) with normal F Response latency Right 2nd-digit Median sensory nerve showed delayed distal peak latency(4.39ms, N<3.9)    Bilateral Ulnar ADM motor nerves were within normal limits with normal F response latencies.  Bilateral Ulnar 5th digit sensory nerves were within normal limits.   The left Opponens Pollicis showed increased motor unit amplitude and reduced motor unit recruitment. Needle evaluation of the following muscles were within normal limits: left Deltoid, left  Triceps, left Pronator teres, left First Dorsal Interosseous(could not activate due to pain), left C6/C7 paraspinal muscles. Patient declined further emg needle exam.   Conclusion: There is electrophysiologic evidence for moderately-severe left>right Carpal Tunnel Syndrome. No suggestion of polyneuropathy or cervical radiculopathy.  Sarina Ill, MD  [facility] [address] [place], [place] [address]  Phone [phone] Fax [phone]

## 2015-03-13 ENCOUNTER — Encounter: Payer: Self-pay | Admitting: Neurology

## 2015-03-13 ENCOUNTER — Ambulatory Visit (INDEPENDENT_AMBULATORY_CARE_PROVIDER_SITE_OTHER): Payer: PPO | Admitting: Neurology

## 2015-03-13 VITALS — BP 135/64 | HR 86 | Temp 98.6°F | Ht 62.0 in | Wt 202.5 lb

## 2015-03-13 DIAGNOSIS — G629 Polyneuropathy, unspecified: Secondary | ICD-10-CM

## 2015-03-13 DIAGNOSIS — M545 Low back pain: Secondary | ICD-10-CM

## 2015-03-13 DIAGNOSIS — M47817 Spondylosis without myelopathy or radiculopathy, lumbosacral region: Secondary | ICD-10-CM | POA: Insufficient documentation

## 2015-03-13 DIAGNOSIS — M5416 Radiculopathy, lumbar region: Secondary | ICD-10-CM

## 2015-03-13 DIAGNOSIS — G8929 Other chronic pain: Secondary | ICD-10-CM | POA: Insufficient documentation

## 2015-03-13 MED ORDER — DULOXETINE HCL 30 MG PO CPEP: 30.0000 mg | ORAL_CAPSULE | Freq: Every day | ORAL | Status: DC

## 2015-03-13 NOTE — Patient Instructions (Signed)
Overall you are doing fairly well but I do want to suggest a few things today:   Remember to drink plenty of fluid, eat healthy meals and do not skip any meals. Try to eat protein with a every meal and eat a healthy snack such as fruit or nuts in between meals. Try to keep a regular sleep-wake schedule and try to exercise daily, particularly in the form of walking, 20-30 minutes a day, if you can.   As far as your medications are concerned, I would like to suggest: Start Cymbalta 30mg  daily  As far as diagnostic testing: Evaluation to [facility]  I would like to see you back in 4-6 months, sooner if we need to. Please call us with any interim questions, concerns, problems, updates or refill requests.   Please also call us for any test results so we can go over those with you on the phone.  My clinical assistant and will answer any of your questions and relay your messages to me and also relay most of my messages to you.   Our phone number is [phone]. We also have an after hours call service for urgent matters and there is a physician on-call for urgent questions. For any emergencies you know to call 911 or go to the nearest emergency room

## 2015-03-13 NOTE — Progress Notes (Signed)
[facility]    Provider:  Dr Jaynee Eagles Referring Provider: Jonathon Resides, MD Primary Care Physician:  Ival Bible, MD  CC: LBP   HPI: Donna Fuller is a 74 y.o. female here as a follow up for low back pain. She is a former Dr. Janann Colonel patient. In 2005 she was diaggnosed with spinal stenosis and last year Dxed with sciatica. The pain is unbearable. The pain radiates from the left back to the side of the left thigh, has hamstring cramping. Worse when sitting for long periods also when walking, she gets cramping. Weakness in the left leg. Progressive and daily, pain is 10/10. Pain wakes her up at night. She has to walk flexed over. Lyrica made her feel funny. The gabapentin works. It doesn't completely help but it helps a little. Her hands are aching the palm of her left hand and tips of digits 2-5. Left hand worse than the right hand. Worsening in the last 6 months. In the middle of the night, pain in her hands wake her up. She tries to move around her left hand to stop the pain, more painful at night. She wakes up with pain too. She is getting cramping in the fingers and weakness in the fingers. They feel numb and painful. Has neck pain.   Interval update April 13th 2016:  She is suffering with her back pain, worsening. She can't sleep at night. She tried steroid shots in the past. Didn't help. Her neuropathy hurts her stable. Discussed what can be done. Will refer her to [place] to see if there is minimally invasive procedures or other pain procedures like nerve ablation that may help her with her radiculopathy. Will also start Cymbalta, discussed side effects she should watch her blood pressure.   Appointment Feburary 9, 2015:   Donna Fuller is a 74 y.o. female here as a referral from Dr. Theodis Sato for back pain evaluation  History of back pain since 2005, at that time diagnosed with lumbar stenosis. The doctor talked about doing surgery at that time but  they decided to hold off. Currently now having exacerbation since December 2014, current symptoms consist of a burning sharp pain in right lumbar region, right groin, right thigh (medial and lateral). Notes some weakness too, having trouble flexing the hip. Having trouble standing up, gets severe lumbar back pain when standing. No distal sensory changes. Symptoms came on suddenly, initially felt pain and then developed weakness. Has some numbness in her groin area. No change in bowel/bladder. Describes pain as currently a "10/10". Has been diagnosed with sciatica by her PCP, states it just makes her feel tired but does not give any relief.   Reviewed notes, labs and imaging from outside physicians, which showed: MRI of the lumbar spine showed prominent spondylitic changes throughout most severe at L2-3 where there is asymmetric facet hypertrophy and disc osteophyte protrusion to the left resulting in severe left-sided foraminal narrowing and possible encroachment on the L2 nerve root. There is also mild left-sided foraminal narrowing at L3-4 and L4-5 but without definite compression. Personally reviewed images, agree with results. HgbA1c 7.6.   Review of Systems: Patient complains of symptoms per HPI as well as the following symptoms *. Pertinent negatives per HPI. All others negative.   History   Social History  . Marital Status: Married    Spouse Name: [name]  . Number of Children: 3  . Years of Education: 12   Occupational History  . Not on file.  Social History Main Topics  . Smoking status: Former Research scientist (life sciences)  . Smokeless tobacco: Never Used  . Alcohol Use: No  . Drug Use: No  . Sexual Activity: Not Currently   Other Topics Concern  . Not on file   Social History Narrative   Marital Status:  Married Manufacturing systems engineer)    Children:  Daughter(1) Son (1)    Pets:  None    Living Situation: Lives with husband and daughter.     Occupation:  Retired Licensed conveyancer)    Education: 12th Grade    Tobacco  Use/Exposure:  She used to smoke socially during the weekends but quit 40 years ago.    Alcohol Use:  Occasional   Drug Use:  None   Diet:  Regular   Exercise:  None   Hobbies:  Reading and playing volleyball                 Family History  Problem Relation Age of Onset  . Diabetes Mother   . Heart disease Father   . Hyperlipidemia Father   . Kidney disease Father     Past Medical History  Diagnosis Date  . Diabetes mellitus without complication   . Breast cancer   . Spinal stenosis   . Arthritis   . Dysuria   . Other malignant neoplasm without specification of site   . Hyperlipidemia   . Hypertension     Past Surgical History  Procedure Laterality Date  . Knee arthroscopy    . Abdominal hysterectomy    . Mastectomy      right side  . Breast lumpectomy    . Tubal ligation      Current Outpatient Prescriptions  Medication Sig Dispense Refill  . Artificial Tear Solution (SYSTANE CONTACTS) SOLN Apply to eye.    Marland Kitchen aspirin 81 MG tablet Take 81 mg by mouth daily.    . benazepril-hydrochlorthiazide (LOTENSIN HCT) 20-12.5 MG per tablet Take 1 tablet by mouth daily. 90 tablet 3  . Cholecalciferol (VITAMIN D3) 5000 UNITS CAPS Take by mouth.    . cyclobenzaprine (FLEXERIL) 10 MG tablet Take 1 tablet (10 mg total) by mouth at bedtime. 30 tablet 3  . DULoxetine (CYMBALTA) 30 MG capsule Take 1 capsule (30 mg total) by mouth daily. 30 capsule 3  . furosemide (LASIX) 20 MG tablet Take 1 tab po in the morning for increased swelling    . gabapentin (NEURONTIN) 300 MG capsule Take 4 capsules (1,200 mg total) by mouth 3 (three) times daily. May take an additional tab in the evening 360 capsule 6  . glucose blood (FREESTYLE INSULINX TEST) test strip Use as instructed 100 each 11  . insulin detemir (LEVEMIR) 100 UNIT/ML injection Inject 0.7 mLs (70 Units total) into the skin daily. 30 mL 5  . latanoprost (XALATAN) 0.005 % ophthalmic solution     . metFORMIN (GLUCOPHAGE-XR) 500 MG 24  hr tablet Take 500 mg by mouth.    . naproxen sodium (ANAPROX) 550 MG tablet Take 550 mg by mouth 2 (two) times daily with a meal.    . omeprazole (PRILOSEC) 20 MG capsule Take 20 mg by mouth daily.    . potassium chloride SA (K-DUR,KLOR-CON) 20 MEQ tablet Take 1 tablet po with the Lasix    . simvastatin (ZOCOR) 10 MG tablet Take 10 mg by mouth daily.    . SitaGLIPtin-MetFORMIN HCl (JANUMET XR) 50-1000 MG TB24 Take 1 tablet by mouth 2 (two) times daily. 30 tablet 5  . tamoxifen (  NOLVADEX) 20 MG tablet Take 20 mg by mouth daily.    . vitamin B-12 (CYANOCOBALAMIN) 100 MCG tablet Take 100 mcg by mouth daily.     No current facility-administered medications for this visit.    Allergies as of 03/13/2015 - Review Complete 03/13/2015  Allergen Reaction Noted  . Sulfa antibiotics Swelling 12/13/2013    Vitals: BP 135/64 mmHg  Pulse 86  Temp(Src) 98.6 F (37 C)  Ht 5\' 2"  (1.575 m)  Wt 202 lb 8 oz (91.853 kg)  BMI 37.03 kg/m2 Last Weight:  Wt Readings from Last 1 Encounters:  03/13/15 202 lb 8 oz (91.853 kg)   Last Height:   Ht Readings from Last 1 Encounters:  03/13/15 5\' 2"  (1.575 m)    Gait:  antalgic gait  Motor Observation:  No asymmetry, no atrophy, and no involuntary movements noted. Tone:  Not increased  Posture:  Mildly stooped   Strength:   Bilateral hip flexion weakness and mild bilat APB weakness.    Sensation:   Decreased to pp and temp in a glove and stocking distribution Reflex Exam:  DTR's:  Absent patellars (bilat replacements), trace achilles  Toes:  The toes are downgoing bilaterally.  Clonus:  Clonus is absent.     Assessment/Plan: 74 year old female here for evaluation of lumbar radiculopathy and paresthesias in her hands as well as neuropathy.  Radiculopathy: Chronic problem.worsenin.  Continue the neurontin. Start cymbalta, watch BP. Hand paresthesias with cramping: Bilat CTS. Stable.  Neuropathy: stable.  Last HgbA1c 7.6, she needs close follow up with pcp for management. Uncontrolled glucose is the most common cause of neuropathy.    She is suffering with her back pain. She can't sleep at night. Worsening.  She tried steroid shots in the past. Didn't help. Her neuropathy hurts her. Discussed what can be done. Will refer her to [place] to see if there is minimally invasive procedures or other pain procedures like nerve ablation that may help her with her radiculopathy. Will also start Cymbalta, discussed side effects she should watch her blood pressure.    MRI of the lumbar spine showed prominent spondylitic changes throughout most severe at L2-3 where there is asymmetric facet hypertrophy and disc osteophyte protrusion to the left resulting in severe left-sided foraminal narrowing and possible encroachment on the L2 nerve root. There is also mild left-sided foraminal narrowing at L3-4 and L4-5 but without definite compression. Personally reviewed images, agree with results. HgbA1c 7.6.   Sarina Ill, MD  [facility] [address] [place], [place] [address]  Phone [phone] Fax [phone]  A total of 25 minutes was spent face-to-face with this patient. Over half this time was spent on counseling patient on the lumbar radiculopathy and neuropathy diagnosis and different diagnostic and therapeutic options available.

## 2015-04-19 ENCOUNTER — Emergency Department (HOSPITAL_BASED_OUTPATIENT_CLINIC_OR_DEPARTMENT_OTHER)
Admission: EM | Admit: 2015-04-19 | Discharge: 2015-04-19 | Disposition: A | Discharge status: Home / Self Care | Payer: PPO | Attending: Emergency Medicine | Admitting: Emergency Medicine

## 2015-04-19 ENCOUNTER — Encounter (HOSPITAL_BASED_OUTPATIENT_CLINIC_OR_DEPARTMENT_OTHER): Payer: Self-pay | Admitting: Family Medicine

## 2015-04-19 DIAGNOSIS — E785 Hyperlipidemia, unspecified: Secondary | ICD-10-CM | POA: Insufficient documentation

## 2015-04-19 DIAGNOSIS — M1990 Unspecified osteoarthritis, unspecified site: Secondary | ICD-10-CM | POA: Insufficient documentation

## 2015-04-19 DIAGNOSIS — B373 Candidiasis of vulva and vagina: Secondary | ICD-10-CM | POA: Insufficient documentation

## 2015-04-19 DIAGNOSIS — Z7982 Long term (current) use of aspirin: Secondary | ICD-10-CM | POA: Diagnosis not present

## 2015-04-19 DIAGNOSIS — R1030 Lower abdominal pain, unspecified: Secondary | ICD-10-CM | POA: Diagnosis present

## 2015-04-19 DIAGNOSIS — Z79899 Other long term (current) drug therapy: Secondary | ICD-10-CM | POA: Diagnosis not present

## 2015-04-19 DIAGNOSIS — Z9851 Tubal ligation status: Secondary | ICD-10-CM | POA: Diagnosis not present

## 2015-04-19 DIAGNOSIS — B356 Tinea cruris: Secondary | ICD-10-CM

## 2015-04-19 DIAGNOSIS — I10 Essential (primary) hypertension: Secondary | ICD-10-CM | POA: Diagnosis not present

## 2015-04-19 DIAGNOSIS — Z90710 Acquired absence of both cervix and uterus: Secondary | ICD-10-CM | POA: Diagnosis not present

## 2015-04-19 DIAGNOSIS — Z794 Long term (current) use of insulin: Secondary | ICD-10-CM | POA: Diagnosis not present

## 2015-04-19 DIAGNOSIS — Z853 Personal history of malignant neoplasm of breast: Secondary | ICD-10-CM | POA: Diagnosis not present

## 2015-04-19 DIAGNOSIS — E119 Type 2 diabetes mellitus without complications: Secondary | ICD-10-CM | POA: Insufficient documentation

## 2015-04-19 DIAGNOSIS — B3731 Acute candidiasis of vulva and vagina: Secondary | ICD-10-CM

## 2015-04-19 LAB — URINALYSIS, ROUTINE W REFLEX MICROSCOPIC
Bilirubin Urine: NEGATIVE
Glucose, UA: NEGATIVE mg/dL
Hgb urine dipstick: NEGATIVE
Ketones, ur: NEGATIVE mg/dL
Leukocytes, UA: NEGATIVE
Nitrite: NEGATIVE
Protein, ur: NEGATIVE mg/dL
Specific Gravity, Urine: 1.012 (ref 1.005–1.030)
Urobilinogen, UA: 0.2 mg/dL (ref 0.0–1.0)
pH: 7 (ref 5.0–8.0)

## 2015-04-19 MED ORDER — FLUCONAZOLE 50 MG PO TABS
150.0000 mg | ORAL_TABLET | Freq: Once | ORAL | Status: AC
  Administered 2015-04-19: 150 mg via ORAL
  Filled 2015-04-19 (×2): qty 1

## 2015-04-19 MED ORDER — TERBINAFINE HCL 1 % EX CREA
1.0000 | TOPICAL_CREAM | Freq: Every day | CUTANEOUS | Status: DC
Start: 2015-04-19 — End: 2016-02-14

## 2015-04-19 NOTE — ED Notes (Addendum)
Pt c/o 2 wk history of burning in the groin area. Denies rash. She also c/o left hip pain. Denies dysuria but reports strong odor and urinary frequency.

## 2015-04-19 NOTE — ED Provider Notes (Signed)
CSN: [id number]     Arrival date & time 04/19/15  1157 History   First MD Initiated Contact with Patient 04/19/15 1204     Chief Complaint  Patient presents with  . Groin Pain     (Consider location/radiation/quality/duration/timing/severity/associated sxs/prior Treatment) Patient is a 74 y.o. female presenting with rash.  Rash Location: groin. Quality: itchiness and redness   Severity:  Severe Onset quality:  Gradual Duration:  2 weeks Timing:  Constant Progression:  Worsening Chronicity:  New Context comment:  Chronic back pain Relieved by:  Nothing Worsened by:  Nothing tried Associated symptoms: no diarrhea, no fever, no induration, no URI and not vomiting   Associated symptoms comment:  No dysuria   Past Medical History  Diagnosis Date  . Diabetes mellitus without complication   . Breast cancer   . Spinal stenosis   . Arthritis   . Dysuria   . Other malignant neoplasm without specification of site   . Hyperlipidemia   . Hypertension    Past Surgical History  Procedure Laterality Date  . Knee arthroscopy    . Abdominal hysterectomy    . Mastectomy      right side  . Breast lumpectomy    . Tubal ligation     Family History  Problem Relation Age of Onset  . Diabetes Mother   . Heart disease Father   . Hyperlipidemia Father   . Kidney disease Father    History  Substance Use Topics  . Smoking status: Former Research scientist (life sciences)  . Smokeless tobacco: Never Used  . Alcohol Use: No   OB History    No data available     Review of Systems  Constitutional: Negative for fever.  Gastrointestinal: Negative for vomiting and diarrhea.  Skin: Positive for rash.  All other systems reviewed and are negative.     Allergies  Sulfa antibiotics  Home Medications   Prior to Admission medications   Medication Sig Start Date End Date Taking? Authorizing Provider  Artificial Tear Solution (SYSTANE CONTACTS) SOLN Apply to eye.    Historical Provider, MD  aspirin 81 MG  tablet Take 81 mg by mouth daily.    Historical Provider, MD  benazepril-hydrochlorthiazide (LOTENSIN HCT) 20-12.5 MG per tablet Take 1 tablet by mouth daily. 02/09/14 02/10/15  Jonathon Resides, MD  Cholecalciferol (VITAMIN D3) 5000 UNITS CAPS Take by mouth.    Historical Provider, MD  cyclobenzaprine (FLEXERIL) 10 MG tablet Take 1 tablet (10 mg total) by mouth at bedtime. 02/09/14   Jonathon Resides, MD  DULoxetine (CYMBALTA) 30 MG capsule Take 1 capsule (30 mg total) by mouth daily. 03/13/15   Melvenia Beam, MD  furosemide (LASIX) 20 MG tablet Take 1 tab po in the morning for increased swelling 11/14/14 11/15/15  Historical Provider, MD  gabapentin (NEURONTIN) 300 MG capsule Take 4 capsules (1,200 mg total) by mouth 3 (three) times daily. May take an additional tab in the evening 12/31/14   Melvenia Beam, MD  glucose blood (FREESTYLE INSULINX TEST) test strip Use as instructed 02/20/14   Jonathon Resides, MD  insulin detemir (LEVEMIR) 100 UNIT/ML injection Inject 0.7 mLs (70 Units total) into the skin daily. 06/20/14 06/21/15  Jonathon Resides, MD  latanoprost (XALATAN) 0.005 % ophthalmic solution  10/29/14   Historical Provider, MD  metFORMIN (GLUCOPHAGE-XR) 500 MG 24 hr tablet Take 500 mg by mouth. 10/15/14 10/15/15  Historical Provider, MD  naproxen sodium (ANAPROX) 550 MG tablet Take 550 mg by mouth 2 (  two) times daily with a meal.    Historical Provider, MD  omeprazole (PRILOSEC) 20 MG capsule Take 20 mg by mouth daily.    Historical Provider, MD  potassium chloride SA (K-DUR,KLOR-CON) 20 MEQ tablet Take 1 tablet po with the Lasix 11/14/14 11/15/15  Historical Provider, MD  simvastatin (ZOCOR) 10 MG tablet Take 10 mg by mouth daily.    Historical Provider, MD  SitaGLIPtin-MetFORMIN HCl (JANUMET XR) 50-1000 MG TB24 Take 1 tablet by mouth 2 (two) times daily. 06/20/14 06/21/15  Jonathon Resides, MD  tamoxifen (NOLVADEX) 20 MG tablet Take 20 mg by mouth daily.    Historical Provider, MD  terbinafine (LAMISIL  AT) 1 % cream Apply 1 application topically daily. Apply daily for 1 week to affected area after showering 04/19/15   Debby Freiberg, MD  vitamin B-12 (CYANOCOBALAMIN) 100 MCG tablet Take 100 mcg by mouth daily.    Historical Provider, MD   BP 183/67 mmHg  Pulse 89  Temp(Src) 99.1 F (37.3 C) (Oral)  Resp 16  Ht 5\' 2"  (1.575 m)  Wt 203 lb (92.08 kg)  BMI 37.12 kg/m2  SpO2 96% Physical Exam  Constitutional: She is oriented to person, place, and time. She appears well-developed and well-nourished.  HENT:  Head: Normocephalic and atraumatic.  Right Ear: External ear normal.  Left Ear: External ear normal.  Eyes: Conjunctivae and EOM are normal. Pupils are equal, round, and reactive to light.  Neck: Normal range of motion. Neck supple.  Cardiovascular: Normal rate, regular rhythm, normal heart sounds and intact distal pulses.   Pulmonary/Chest: Effort normal and breath sounds normal.  Abdominal: Soft. Bowel sounds are normal. There is no tenderness.  Genitourinary:  Erythema around vulva, no obvious ulcerations or vesicles  Musculoskeletal: Normal range of motion.  Neurological: She is alert and oriented to person, place, and time.  Skin: Skin is warm and dry.  Vitals reviewed.   ED Course  Procedures (including critical care time) Labs Review Labs Reviewed  URINALYSIS, ROUTINE W REFLEX MICROSCOPIC    Imaging Review No results found.   EKG Interpretation None      MDM   Final diagnoses:  Candidal vulvovaginitis  Jock itch    74 y.o. female with pertinent PMH of prior candidal vulvovaginitis presents with recurrent symptoms of same, as well as burning abd pain.  On arrival pt has vitals and physical exam as above.  Offered pelvic exam however pt refused.  Suspect jock itch, however with vaginal irritation internally will treat with fluconazole.  Doubt BV given external symptoms.  DC home in stable condition.    I have reviewed all laboratory and imaging studies if  ordered as above  1. Candidal vulvovaginitis   2. Jock itch         Debby Freiberg, MD 04/19/15 [phone]

## 2015-04-19 NOTE — Discharge Instructions (Signed)
Candida Infection A Candida infection (also called yeast, fungus, and Monilia infection) is an overgrowth of yeast that can occur anywhere on the body. A yeast infection commonly occurs in warm, moist body areas. Usually, the infection remains localized but can spread to become a systemic infection. A yeast infection may be a sign of a more severe disease such as diabetes, leukemia, or AIDS. A yeast infection can occur in both men and women. In women, Candida vaginitis is a vaginal infection. It is one of the most common causes of vaginitis. Men usually do not have symptoms or know they have an infection until other problems develop. Men may find out they have a yeast infection because their sex partner has a yeast infection. Uncircumcised men are more likely to get a yeast infection than circumcised men. This is because the uncircumcised glans is not exposed to air and does not remain as dry as that of a circumcised glans. Older adults may develop yeast infections around dentures. CAUSES  Women  Antibiotics.  Steroid medication taken for a long time.  Being overweight (obese).  Diabetes.  Poor immune condition.  Certain serious medical conditions.  Immune suppressive medications for organ transplant patients.  Chemotherapy.  Pregnancy.  Menstruation.  Stress and fatigue.  Intravenous drug use.  Oral contraceptives.  Wearing tight-fitting clothes in the crotch area.  Catching it from a sex partner who has a yeast infection.  Spermicide.  Intravenous, urinary, or other catheters. Men  Catching it from a sex partner who has a yeast infection.  Having oral or anal sex with a person who has the infection.  Spermicide.  Diabetes.  Antibiotics.  Poor immune system.  Medications that suppress the immune system.  Intravenous drug use.  Intravenous, urinary, or other catheters. SYMPTOMS  Women  Thick, white vaginal discharge.  Vaginal itching.  Redness and  swelling in and around the vagina.  Irritation of the lips of the vagina and perineum.  Blisters on the vaginal lips and perineum.  Painful sexual intercourse.  Low blood sugar (hypoglycemia).  Painful urination.  Bladder infections.  Intestinal problems such as constipation, indigestion, bad breath, bloating, increase in gas, diarrhea, or loose stools. Men  Men may develop intestinal problems such as constipation, indigestion, bad breath, bloating, increase in gas, diarrhea, or loose stools.  Dry, cracked skin on the penis with itching or discomfort.  Jock itch.  Dry, flaky skin.  Athlete's foot.  Hypoglycemia. DIAGNOSIS  Women  A history and an exam are performed.  The discharge may be examined under a microscope.  A culture may be taken of the discharge. Men  A history and an exam are performed.  Any discharge from the penis or areas of cracked skin will be looked at under the microscope and cultured.  Stool samples may be cultured. TREATMENT  Women  Vaginal antifungal suppositories and creams.  Medicated creams to decrease irritation and itching on the outside of the vagina.  Warm compresses to the perineal area to decrease swelling and discomfort.  Oral antifungal medications.  Medicated vaginal suppositories or cream for repeated or recurrent infections.  Wash and dry the irritation areas before applying the cream.  Eating yogurt with Lactobacillus may help with prevention and treatment.  Sometimes painting the vagina with gentian violet solution may help if creams and suppositories do not work. Men  Antifungal creams and oral antifungal medications.  Sometimes treatment must continue for 30 days after the symptoms go away to prevent recurrence. HOME CARE INSTRUCTIONS  Women  Use cotton underwear and avoid tight-fitting clothing.  Avoid colored, scented toilet paper and deodorant tampons or pads.  Do not douche.  Keep your diabetes  under control.  Finish all the prescribed medications.  Keep your skin clean and dry.  Consume milk or yogurt with Lactobacillus-active culture regularly. If you get frequent yeast infections and think that is what the infection is, there are over-the-counter medications that you can get. If the infection does not show healing in 3 days, talk to your caregiver.  Tell your sex partner you have a yeast infection. Your partner may need treatment also, especially if your infection does not clear up or recurs. Men  Keep your skin clean and dry.  Keep your diabetes under control.  Finish all prescribed medications.  Tell your sex partner that you have a yeast infection so he or she can be treated if necessary. SEEK MEDICAL CARE IF:   Your symptoms do not clear up or worsen in one week after treatment.  You have an oral temperature above 102 F (38.9 C).  You have trouble swallowing or eating for a prolonged time.  You develop blisters on and around your vagina.  You develop vaginal bleeding and it is not your menstrual period.  You develop abdominal pain.  You develop intestinal problems as mentioned above.  You get weak or light-headed.  You have painful or increased urination.  You have pain during sexual intercourse. MAKE SURE YOU:   Understand these instructions.  Will watch your condition.  Will get help right away if you are not doing well or get worse. Document Released: 12/24/2004 Document Revised: 04/02/2014 Document Reviewed: 04/07/2010 [facility] Patient Information 2015 [place], [place]. This information is not intended to replace advice given to you by your health care provider. Make sure you discuss any questions you have with your health care provider. Terbinafine skin cream, gel, or topical solution What is this medicine? TERBINAFINE (TER bin a feen) is an antifungal medicine. It is used to treat certain kinds of fungal or yeast infections of the skin. This  medicine may be used for other purposes; ask your health care provider or pharmacist if you have questions. COMMON BRAND NAME(S): Desenex Max, Lamisil AT, Lamisil AT Athletes Foot, Lamisil AT Ray[place] Itch What should I tell my health care provider before I take this medicine? They need to know if you have any of these conditions: -an unusual or allergic reaction to terbinafine, other medicines, foods, dyes, or preservatives -pregnant or trying to get pregnant -breast-feeding How should I use this medicine? This medicine is for external use only. Do not take by mouth. Follow the directions on the prescription label. Wash your hands before and after use. If treating hand or nail infections, wash hands before use only. Apply enough product to cover the affected skin or nail and surrounding area. Do not cover the treated area with a bandage or dressing unless your doctor or health care professional tells you to. Do not get this medicine in your eyes. If you do, rinse out with plenty of cool tap water. Use this medicine at regular intervals. Do not use more often than directed. Finish the full course prescribed even if you think your are better. Do not skip doses or stop using this medicine early. Talk to your pediatrician regarding the use of this medicine in children. Special care may be needed. Overdosage: If you think you have taken too much of this medicine contact a poison control center  or emergency room at once. NOTE: This medicine is only for you. Do not share this medicine with others. What if I miss a dose? If you miss a dose, apply it as soon as you can. If it is almost time for your next dose, use only that dose. Do not use double or extra doses. What may interact with this medicine? Interactions are not expected. Do not use any other skin products on the affected area without telling your doctor or health care professional. This list may not describe all possible interactions. Give your health  care provider a list of all the medicines, herbs, non-prescription drugs, or dietary supplements you use. Also tell them if you smoke, drink alcohol, or use illegal drugs. Some items may interact with your medicine. What should I watch for while using this medicine? Tell your doctor or health care professional if your symptoms do not improve after 1 week. Some fungal infections can take a long time to be cured. Be sure to finish your full course of treatment. After bathing, make sure to dry your skin completely. Most types of fungus live in moist environments. Wear clean socks and clothing every day. What side effects may I notice from receiving this medicine? Side effects that you should report to your doctor or health care professional as soon as possible: -skin rash, itching -blistering, increased redness, peeling, or swelling of the skin Side effects that usually do not require medical attention (report to your doctor or health care professional if they continue or are bothersome): -dry skin -minor skin irritation, burning, or stinging This list may not describe all possible side effects. Call your doctor for medical advice about side effects. You may report side effects to FDA at 1-800-FDA-1088. Where should I keep my medicine? Keep out of the reach of children. Store at room temperature between 5 abd 25 degrees C (41 and 77 degrees F). Do not refrigerate. Throw away any unused medicine after the expiration date. NOTE: This sheet is a summary. It may not cover all possible information. If you have questions about this medicine, talk to your doctor, pharmacist, or health care provider.  2015, Elsevier/Gold Standard. (2008-08-01 13:49:39)

## 2015-07-18 ENCOUNTER — Other Ambulatory Visit (HOSPITAL_COMMUNITY): Payer: Self-pay | Admitting: Family Medicine

## 2015-07-18 DIAGNOSIS — R799 Abnormal finding of blood chemistry, unspecified: Secondary | ICD-10-CM

## 2015-07-18 DIAGNOSIS — R6889 Other general symptoms and signs: Secondary | ICD-10-CM

## 2015-07-25 ENCOUNTER — Ambulatory Visit (HOSPITAL_COMMUNITY): Payer: PPO

## 2015-08-02 ENCOUNTER — Ambulatory Visit (HOSPITAL_COMMUNITY)
Admission: RE | Admit: 2015-08-02 | Discharge: 2015-08-02 | Disposition: A | Discharge status: Home / Self Care | Payer: PPO | Source: Ambulatory Visit | Attending: Family Medicine | Admitting: Family Medicine

## 2015-08-02 DIAGNOSIS — C50919 Malignant neoplasm of unspecified site of unspecified female breast: Secondary | ICD-10-CM | POA: Insufficient documentation

## 2015-08-02 DIAGNOSIS — R6889 Other general symptoms and signs: Secondary | ICD-10-CM | POA: Diagnosis not present

## 2015-08-02 LAB — GLUCOSE, CAPILLARY: Glucose-Capillary: 134 mg/dL — ABNORMAL HIGH (ref 65–99)

## 2015-08-02 MED ORDER — FLUDEOXYGLUCOSE F - 18 (FDG) INJECTION
10.9000 | Freq: Once | INTRAVENOUS | Status: DC | PRN
  Administered 2015-08-02: 10.9 via INTRAVENOUS
  Filled 2015-08-02: qty 10.9

## 2015-12-13 DIAGNOSIS — E0821 Diabetes mellitus due to underlying condition with diabetic nephropathy: Secondary | ICD-10-CM | POA: Diagnosis not present

## 2015-12-13 DIAGNOSIS — M109 Gout, unspecified: Secondary | ICD-10-CM | POA: Diagnosis not present

## 2015-12-13 DIAGNOSIS — Z794 Long term (current) use of insulin: Secondary | ICD-10-CM | POA: Diagnosis not present

## 2015-12-13 DIAGNOSIS — E559 Vitamin D deficiency, unspecified: Secondary | ICD-10-CM | POA: Diagnosis not present

## 2015-12-13 DIAGNOSIS — E119 Type 2 diabetes mellitus without complications: Secondary | ICD-10-CM | POA: Diagnosis not present

## 2015-12-20 DIAGNOSIS — M109 Gout, unspecified: Secondary | ICD-10-CM | POA: Diagnosis not present

## 2015-12-20 DIAGNOSIS — K219 Gastro-esophageal reflux disease without esophagitis: Secondary | ICD-10-CM | POA: Diagnosis not present

## 2015-12-20 DIAGNOSIS — M5441 Lumbago with sciatica, right side: Secondary | ICD-10-CM | POA: Diagnosis not present

## 2015-12-20 DIAGNOSIS — Z794 Long term (current) use of insulin: Secondary | ICD-10-CM | POA: Diagnosis not present

## 2015-12-20 DIAGNOSIS — E1165 Type 2 diabetes mellitus with hyperglycemia: Secondary | ICD-10-CM | POA: Diagnosis not present

## 2015-12-20 DIAGNOSIS — G8929 Other chronic pain: Secondary | ICD-10-CM | POA: Diagnosis not present

## 2016-02-14 ENCOUNTER — Emergency Department (HOSPITAL_BASED_OUTPATIENT_CLINIC_OR_DEPARTMENT_OTHER)
Admission: EM | Admit: 2016-02-14 | Discharge: 2016-02-14 | Disposition: A | Discharge status: Home / Self Care | Payer: PPO | Attending: Emergency Medicine | Admitting: Emergency Medicine

## 2016-02-14 ENCOUNTER — Emergency Department (HOSPITAL_BASED_OUTPATIENT_CLINIC_OR_DEPARTMENT_OTHER): Payer: PPO

## 2016-02-14 ENCOUNTER — Encounter (HOSPITAL_BASED_OUTPATIENT_CLINIC_OR_DEPARTMENT_OTHER): Payer: Self-pay | Admitting: Emergency Medicine

## 2016-02-14 DIAGNOSIS — R509 Fever, unspecified: Secondary | ICD-10-CM | POA: Diagnosis not present

## 2016-02-14 DIAGNOSIS — Z7984 Long term (current) use of oral hypoglycemic drugs: Secondary | ICD-10-CM | POA: Diagnosis not present

## 2016-02-14 DIAGNOSIS — Z791 Long term (current) use of non-steroidal anti-inflammatories (NSAID): Secondary | ICD-10-CM | POA: Diagnosis not present

## 2016-02-14 DIAGNOSIS — Z794 Long term (current) use of insulin: Secondary | ICD-10-CM | POA: Diagnosis not present

## 2016-02-14 DIAGNOSIS — E785 Hyperlipidemia, unspecified: Secondary | ICD-10-CM | POA: Insufficient documentation

## 2016-02-14 DIAGNOSIS — J069 Acute upper respiratory infection, unspecified: Secondary | ICD-10-CM

## 2016-02-14 DIAGNOSIS — R197 Diarrhea, unspecified: Secondary | ICD-10-CM | POA: Insufficient documentation

## 2016-02-14 DIAGNOSIS — R079 Chest pain, unspecified: Secondary | ICD-10-CM | POA: Diagnosis not present

## 2016-02-14 DIAGNOSIS — R600 Localized edema: Secondary | ICD-10-CM | POA: Diagnosis not present

## 2016-02-14 DIAGNOSIS — Z7982 Long term (current) use of aspirin: Secondary | ICD-10-CM | POA: Insufficient documentation

## 2016-02-14 DIAGNOSIS — R05 Cough: Secondary | ICD-10-CM | POA: Diagnosis not present

## 2016-02-14 DIAGNOSIS — I10 Essential (primary) hypertension: Secondary | ICD-10-CM | POA: Diagnosis not present

## 2016-02-14 DIAGNOSIS — Z79899 Other long term (current) drug therapy: Secondary | ICD-10-CM | POA: Insufficient documentation

## 2016-02-14 DIAGNOSIS — E119 Type 2 diabetes mellitus without complications: Secondary | ICD-10-CM | POA: Insufficient documentation

## 2016-02-14 DIAGNOSIS — Z853 Personal history of malignant neoplasm of breast: Secondary | ICD-10-CM | POA: Diagnosis not present

## 2016-02-14 DIAGNOSIS — M549 Dorsalgia, unspecified: Secondary | ICD-10-CM | POA: Insufficient documentation

## 2016-02-14 DIAGNOSIS — M1990 Unspecified osteoarthritis, unspecified site: Secondary | ICD-10-CM | POA: Diagnosis not present

## 2016-02-14 DIAGNOSIS — R0602 Shortness of breath: Secondary | ICD-10-CM | POA: Diagnosis not present

## 2016-02-14 LAB — URINALYSIS, ROUTINE W REFLEX MICROSCOPIC
Bilirubin Urine: NEGATIVE
Glucose, UA: NEGATIVE mg/dL
Hgb urine dipstick: NEGATIVE
Ketones, ur: NEGATIVE mg/dL
Leukocytes, UA: NEGATIVE
Nitrite: NEGATIVE
Protein, ur: NEGATIVE mg/dL
Specific Gravity, Urine: 1.018 (ref 1.005–1.030)
pH: 5 (ref 5.0–8.0)

## 2016-02-14 NOTE — ED Provider Notes (Signed)
CSN: [id number]     Arrival date & time 02/14/16  [id number] History   First MD Initiated Contact with Patient 02/14/16 1017     Chief Complaint  Patient presents with  . URI      Patient is a 75 y.o. female presenting with URI.  URI Presenting symptoms: congestion and cough   Patient has had a cough for the last week. States she initially has some thick sputum but it is been clearing up. She's been on cough medicine. States it is cleared up the sputum. States she does feel fatigued. States she has been feeling a little better but then got worse again. She is also had some diarrhea. No nausea or vomiting. No fevers. She does not smoke. Her sugars have been good. No recent history of asthma. States she has felt fatigue. She has a history of back pain and sciatica and states that is gotten worse recently.  Past Medical History  Diagnosis Date  . Diabetes mellitus without complication ([place])   . Breast cancer ([place])   . Spinal stenosis   . Arthritis   . Dysuria   . Other malignant neoplasm without specification of site   . Hyperlipidemia   . Hypertension    Past Surgical History  Procedure Laterality Date  . Knee arthroscopy    . Abdominal hysterectomy    . Mastectomy      right side  . Breast lumpectomy    . Tubal ligation     Family History  Problem Relation Age of Onset  . Diabetes Mother   . Heart disease Father   . Hyperlipidemia Father   . Kidney disease Father    Social History  Substance Use Topics  . Smoking status: Former Research scientist (life sciences)  . Smokeless tobacco: Never Used  . Alcohol Use: No   OB History    No data available     Review of Systems  Constitutional: Positive for appetite change.  HENT: Positive for congestion.   Respiratory: Positive for cough and shortness of breath.   Cardiovascular: Positive for chest pain.  Gastrointestinal: Positive for diarrhea.  Musculoskeletal: Positive for back pain.  Skin: Negative for wound.  Neurological: Negative for syncope  and weakness.  Hematological: Negative for adenopathy.      Allergies  Sulfa antibiotics  Home Medications   Prior to Admission medications   Medication Sig Start Date End Date Taking? Authorizing Provider  insulin lispro (HUMALOG) 100 UNIT/ML injection Inject into the skin 3 (three) times daily before meals.   Yes Historical Provider, MD  sitaGLIPtin (JANUVIA) 50 MG tablet Take 50 mg by mouth daily.   Yes Historical Provider, MD  Artificial Tear Solution (SYSTANE CONTACTS) SOLN Apply to eye.    Historical Provider, MD  aspirin 81 MG tablet Take 81 mg by mouth daily.    Historical Provider, MD  benazepril-hydrochlorthiazide (LOTENSIN HCT) 20-12.5 MG per tablet Take 1 tablet by mouth daily. 02/09/14 02/10/15  Jonathon Resides, MD  Cholecalciferol (VITAMIN D3) 5000 UNITS CAPS Take by mouth.    Historical Provider, MD  cyclobenzaprine (FLEXERIL) 10 MG tablet Take 1 tablet (10 mg total) by mouth at bedtime. 02/09/14   Jonathon Resides, MD  DULoxetine (CYMBALTA) 30 MG capsule Take 1 capsule (30 mg total) by mouth daily. 03/13/15   Melvenia Beam, MD  furosemide (LASIX) 20 MG tablet Take 1 tab po in the morning for increased swelling 11/14/14 11/15/15  Historical Provider, MD  gabapentin (NEURONTIN) 300 MG capsule Take 4  capsules (1,200 mg total) by mouth 3 (three) times daily. May take an additional tab in the evening 12/31/14   Melvenia Beam, MD  glucose blood (FREESTYLE INSULINX TEST) test strip Use as instructed 02/20/14   Jonathon Resides, MD  insulin detemir (LEVEMIR) 100 UNIT/ML injection Inject 0.7 mLs (70 Units total) into the skin daily. 06/20/14 06/21/15  Jonathon Resides, MD  latanoprost (XALATAN) 0.005 % ophthalmic solution  10/29/14   Historical Provider, MD  naproxen sodium (ANAPROX) 550 MG tablet Take 550 mg by mouth 2 (two) times daily with a meal.    Historical Provider, MD  omeprazole (PRILOSEC) 20 MG capsule Take 20 mg by mouth daily.    Historical Provider, MD  potassium chloride SA  (K-DUR,KLOR-CON) 20 MEQ tablet Take 1 tablet po with the Lasix 11/14/14 11/15/15  Historical Provider, MD  simvastatin (ZOCOR) 10 MG tablet Take 10 mg by mouth daily.    Historical Provider, MD  tamoxifen (NOLVADEX) 20 MG tablet Take 20 mg by mouth daily.    Historical Provider, MD  vitamin B-12 (CYANOCOBALAMIN) 100 MCG tablet Take 100 mcg by mouth daily.    Historical Provider, MD   BP 174/55 mmHg  Pulse 96  Temp(Src) 98 F (36.7 C) (Oral)  Resp 18  Ht 5\' 2"  (1.575 m)  Wt 200 lb (90.719 kg)  BMI 36.57 kg/m2  SpO2 98% Physical Exam  Constitutional: She appears well-developed and well-nourished.  HENT:  Head: Normocephalic.  Eyes: Pupils are equal, round, and reactive to light.  Neck: Normal range of motion.  Cardiovascular: Normal rate, regular rhythm and normal heart sounds.   No murmur heard. Pulmonary/Chest: Effort normal. No respiratory distress.  Abdominal: Soft. Bowel sounds are normal. She exhibits no distension.  Musculoskeletal: Normal range of motion. She exhibits edema.  Mild bilateral lower extremity pitting edema.  Neurological: She is alert.  Skin: Skin is warm and dry.  Psychiatric: Her speech is normal.  Nursing note and vitals reviewed.   ED Course  Procedures (including critical care time) Labs Review Labs Reviewed  URINALYSIS, ROUTINE W REFLEX MICROSCOPIC (NOT AT [facility])    Imaging Review Dg Chest 2 View  02/14/2016  CLINICAL DATA:  Cough, fever and congestion for 2 weeks. Former smoker. Initial encounter. EXAM: CHEST  2 VIEW COMPARISON:  PA and lateral chest 09/17/2010. PET CT scan 08/02/2015. FINDINGS: The lungs are clear. Heart size is normal. No pneumothorax or pleural effusion. Surgical clips right axilla noted. No focal bony abnormality. IMPRESSION: No acute disease. Electronically Signed   By: Inge Rise M.D.   On: 02/14/2016 11:21   I have personally reviewed and evaluated these images and lab results as part of my medical decision-making.    EKG Interpretation None      MDM   Final diagnoses:  URI (upper respiratory infection)    Patient with URI symptoms over the last week. Nonfocal lung findings and negative x-ray. Doubt flu at this time. Negative urine also for back pain. Will discharge home. Patient states all she can take for pain is Tylenol.    Davonna Belling, MD 02/14/16 1151

## 2016-02-14 NOTE — ED Notes (Signed)
Pt having runny nose, cough, chills, aches and pains for one week.

## 2016-02-14 NOTE — Discharge Instructions (Signed)
Upper Respiratory Infection, Adult Most upper respiratory infections (URIs) are a viral infection of the air passages leading to the lungs. A URI affects the nose, throat, and upper air passages. The most common type of URI is nasopharyngitis and is typically referred to as "the common cold." URIs run their course and usually go away on their own. Most of the time, a URI does not require medical attention, but sometimes a bacterial infection in the upper airways can follow a viral infection. This is called a secondary infection. Sinus and middle ear infections are common types of secondary upper respiratory infections. Bacterial pneumonia can also complicate a URI. A URI can worsen asthma and chronic obstructive pulmonary disease (COPD). Sometimes, these complications can require emergency medical care and may be life threatening.  CAUSES Almost all URIs are caused by viruses. A virus is a type of germ and can spread from one person to another.  RISKS FACTORS You may be at risk for a URI if:   You smoke.   You have chronic heart or lung disease.  You have a weakened defense (immune) system.   You are very young or very old.   You have nasal allergies or asthma.  You work in crowded or poorly ventilated areas.  You work in health care facilities or schools. SIGNS AND SYMPTOMS  Symptoms typically develop 2-3 days after you come in contact with a cold virus. Most viral URIs last 7-10 days. However, viral URIs from the influenza virus (flu virus) can last 14-18 days and are typically more severe. Symptoms may include:   Runny or stuffy (congested) nose.   Sneezing.   Cough.   Sore throat.   Headache.   Fatigue.   Fever.   Loss of appetite.   Pain in your forehead, behind your eyes, and over your cheekbones (sinus pain).  Muscle aches.  DIAGNOSIS  Your health care provider may diagnose a URI by:  Physical exam.  Tests to check that your symptoms are not due to  another condition such as:  Strep throat.  Sinusitis.  Pneumonia.  Asthma. TREATMENT  A URI goes away on its own with time. It cannot be cured with medicines, but medicines may be prescribed or recommended to relieve symptoms. Medicines may help:  Reduce your fever.  Reduce your cough.  Relieve nasal congestion. HOME CARE INSTRUCTIONS   Take medicines only as directed by your health care provider.   Gargle warm saltwater or take cough drops to comfort your throat as directed by your health care provider.  Use a warm mist humidifier or inhale steam from a shower to increase air moisture. This may make it easier to breathe.  Drink enough fluid to keep your urine clear or pale yellow.   Eat soups and other clear broths and maintain good nutrition.   Rest as needed.   Return to work when your temperature has returned to normal or as your health care provider advises. You may need to stay home longer to avoid infecting others. You can also use a face mask and careful hand washing to prevent spread of the virus.  Increase the usage of your inhaler if you have asthma.   Do not use any tobacco products, including cigarettes, chewing tobacco, or electronic cigarettes. If you need help quitting, ask your health care provider. PREVENTION  The best way to protect yourself from getting a cold is to practice good hygiene.   Avoid oral or hand contact with people with cold   symptoms.   Wash your hands often if contact occurs.  There is no clear evidence that vitamin C, vitamin E, echinacea, or exercise reduces the chance of developing a cold. However, it is always recommended to get plenty of rest, exercise, and practice good nutrition.  SEEK MEDICAL CARE IF:   You are getting worse rather than better.   Your symptoms are not controlled by medicine.   You have chills.  You have worsening shortness of breath.  You have brown or red mucus.  You have yellow or brown nasal  discharge.  You have pain in your face, especially when you bend forward.  You have a fever.  You have swollen neck glands.  You have pain while swallowing.  You have white areas in the back of your throat. SEEK IMMEDIATE MEDICAL CARE IF:   You have severe or persistent:  Headache.  Ear pain.  Sinus pain.  Chest pain.  You have chronic lung disease and any of the following:  Wheezing.  Prolonged cough.  Coughing up blood.  A change in your usual mucus.  You have a stiff neck.  You have changes in your:  Vision.  Hearing.  Thinking.  Mood. MAKE SURE YOU:   Understand these instructions.  Will watch your condition.  Will get help right away if you are not doing well or get worse.   This information is not intended to replace advice given to you by your health care provider. Make sure you discuss any questions you have with your health care provider.   Document Released: 05/12/2001 Document Revised: 04/02/2015 Document Reviewed: 02/21/2014 Elsevier Interactive Patient Education 2016 Elsevier Inc.  

## 2016-02-14 NOTE — ED Notes (Signed)
Patient transported to X-ray 

## 2016-03-18 DIAGNOSIS — E785 Hyperlipidemia, unspecified: Secondary | ICD-10-CM | POA: Diagnosis not present

## 2016-03-18 DIAGNOSIS — E1140 Type 2 diabetes mellitus with diabetic neuropathy, unspecified: Secondary | ICD-10-CM | POA: Diagnosis not present

## 2016-03-18 DIAGNOSIS — Z794 Long term (current) use of insulin: Secondary | ICD-10-CM | POA: Diagnosis not present

## 2016-03-18 DIAGNOSIS — I10 Essential (primary) hypertension: Secondary | ICD-10-CM | POA: Diagnosis not present

## 2016-03-25 DIAGNOSIS — E119 Type 2 diabetes mellitus without complications: Secondary | ICD-10-CM | POA: Diagnosis not present

## 2016-03-25 DIAGNOSIS — Z794 Long term (current) use of insulin: Secondary | ICD-10-CM | POA: Diagnosis not present

## 2016-03-25 DIAGNOSIS — D649 Anemia, unspecified: Secondary | ICD-10-CM | POA: Diagnosis not present

## 2016-03-25 DIAGNOSIS — I10 Essential (primary) hypertension: Secondary | ICD-10-CM | POA: Diagnosis not present

## 2016-04-06 DIAGNOSIS — H2511 Age-related nuclear cataract, right eye: Secondary | ICD-10-CM | POA: Diagnosis not present

## 2016-04-06 DIAGNOSIS — H401131 Primary open-angle glaucoma, bilateral, mild stage: Secondary | ICD-10-CM | POA: Diagnosis not present

## 2016-04-06 DIAGNOSIS — H2512 Age-related nuclear cataract, left eye: Secondary | ICD-10-CM | POA: Diagnosis not present

## 2016-04-06 DIAGNOSIS — H25012 Cortical age-related cataract, left eye: Secondary | ICD-10-CM | POA: Diagnosis not present

## 2016-04-06 DIAGNOSIS — H25011 Cortical age-related cataract, right eye: Secondary | ICD-10-CM | POA: Diagnosis not present

## 2016-04-09 DIAGNOSIS — C50919 Malignant neoplasm of unspecified site of unspecified female breast: Secondary | ICD-10-CM | POA: Diagnosis not present

## 2016-04-09 DIAGNOSIS — D0511 Intraductal carcinoma in situ of right breast: Secondary | ICD-10-CM | POA: Diagnosis not present

## 2016-04-09 DIAGNOSIS — Z9011 Acquired absence of right breast and nipple: Secondary | ICD-10-CM | POA: Diagnosis not present

## 2016-04-09 DIAGNOSIS — Z853 Personal history of malignant neoplasm of breast: Secondary | ICD-10-CM | POA: Diagnosis not present

## 2016-04-09 DIAGNOSIS — E119 Type 2 diabetes mellitus without complications: Secondary | ICD-10-CM | POA: Diagnosis not present

## 2016-04-09 DIAGNOSIS — N6092 Unspecified benign mammary dysplasia of left breast: Secondary | ICD-10-CM | POA: Diagnosis not present

## 2016-04-09 DIAGNOSIS — E785 Hyperlipidemia, unspecified: Secondary | ICD-10-CM | POA: Diagnosis not present

## 2016-04-09 DIAGNOSIS — I10 Essential (primary) hypertension: Secondary | ICD-10-CM | POA: Diagnosis not present

## 2016-04-09 DIAGNOSIS — D649 Anemia, unspecified: Secondary | ICD-10-CM | POA: Diagnosis not present

## 2016-04-09 DIAGNOSIS — B356 Tinea cruris: Secondary | ICD-10-CM | POA: Diagnosis not present

## 2016-04-09 DIAGNOSIS — Z79810 Long term (current) use of selective estrogen receptor modulators (SERMs): Secondary | ICD-10-CM | POA: Diagnosis not present

## 2016-04-28 DIAGNOSIS — I10 Essential (primary) hypertension: Secondary | ICD-10-CM | POA: Diagnosis not present

## 2016-04-28 DIAGNOSIS — Z794 Long term (current) use of insulin: Secondary | ICD-10-CM | POA: Diagnosis not present

## 2016-04-28 DIAGNOSIS — E1165 Type 2 diabetes mellitus with hyperglycemia: Secondary | ICD-10-CM | POA: Diagnosis not present

## 2016-04-28 DIAGNOSIS — D649 Anemia, unspecified: Secondary | ICD-10-CM | POA: Diagnosis not present

## 2016-04-28 DIAGNOSIS — M2550 Pain in unspecified joint: Secondary | ICD-10-CM | POA: Diagnosis not present

## 2016-07-13 DIAGNOSIS — N62 Hypertrophy of breast: Secondary | ICD-10-CM | POA: Diagnosis not present

## 2016-07-13 DIAGNOSIS — Z853 Personal history of malignant neoplasm of breast: Secondary | ICD-10-CM | POA: Diagnosis not present

## 2016-07-13 DIAGNOSIS — D509 Iron deficiency anemia, unspecified: Secondary | ICD-10-CM | POA: Diagnosis not present

## 2016-07-13 DIAGNOSIS — D649 Anemia, unspecified: Secondary | ICD-10-CM | POA: Diagnosis not present

## 2016-07-13 DIAGNOSIS — I10 Essential (primary) hypertension: Secondary | ICD-10-CM | POA: Diagnosis not present

## 2016-07-13 DIAGNOSIS — D0511 Intraductal carcinoma in situ of right breast: Secondary | ICD-10-CM | POA: Diagnosis not present

## 2016-07-13 DIAGNOSIS — Z9011 Acquired absence of right breast and nipple: Secondary | ICD-10-CM | POA: Diagnosis not present

## 2016-07-13 DIAGNOSIS — E785 Hyperlipidemia, unspecified: Secondary | ICD-10-CM | POA: Diagnosis not present

## 2016-07-13 DIAGNOSIS — E119 Type 2 diabetes mellitus without complications: Secondary | ICD-10-CM | POA: Diagnosis not present

## 2016-07-28 DIAGNOSIS — E1165 Type 2 diabetes mellitus with hyperglycemia: Secondary | ICD-10-CM | POA: Diagnosis not present

## 2016-07-28 DIAGNOSIS — D649 Anemia, unspecified: Secondary | ICD-10-CM | POA: Diagnosis not present

## 2016-07-28 DIAGNOSIS — Z794 Long term (current) use of insulin: Secondary | ICD-10-CM | POA: Diagnosis not present

## 2016-08-05 DIAGNOSIS — I10 Essential (primary) hypertension: Secondary | ICD-10-CM | POA: Diagnosis not present

## 2016-08-05 DIAGNOSIS — E119 Type 2 diabetes mellitus without complications: Secondary | ICD-10-CM | POA: Diagnosis not present

## 2016-08-05 DIAGNOSIS — Z794 Long term (current) use of insulin: Secondary | ICD-10-CM | POA: Diagnosis not present

## 2016-08-05 DIAGNOSIS — E669 Obesity, unspecified: Secondary | ICD-10-CM | POA: Diagnosis not present

## 2016-09-25 ENCOUNTER — Encounter: Payer: Self-pay | Admitting: Family

## 2016-09-25 ENCOUNTER — Ambulatory Visit (INDEPENDENT_AMBULATORY_CARE_PROVIDER_SITE_OTHER): Payer: PPO | Admitting: Family

## 2016-09-25 VITALS — BP 140/74 | HR 76 | Temp 97.8°F | Resp 16 | Ht 62.0 in | Wt 199.0 lb

## 2016-09-25 DIAGNOSIS — E785 Hyperlipidemia, unspecified: Secondary | ICD-10-CM | POA: Diagnosis not present

## 2016-09-25 DIAGNOSIS — M1A9XX0 Chronic gout, unspecified, without tophus (tophi): Secondary | ICD-10-CM | POA: Diagnosis not present

## 2016-09-25 DIAGNOSIS — Z794 Long term (current) use of insulin: Secondary | ICD-10-CM

## 2016-09-25 DIAGNOSIS — K219 Gastro-esophageal reflux disease without esophagitis: Secondary | ICD-10-CM

## 2016-09-25 DIAGNOSIS — D509 Iron deficiency anemia, unspecified: Secondary | ICD-10-CM

## 2016-09-25 DIAGNOSIS — I10 Essential (primary) hypertension: Secondary | ICD-10-CM | POA: Diagnosis not present

## 2016-09-25 DIAGNOSIS — M4800 Spinal stenosis, site unspecified: Secondary | ICD-10-CM

## 2016-09-25 DIAGNOSIS — Z8669 Personal history of other diseases of the nervous system and sense organs: Secondary | ICD-10-CM

## 2016-09-25 DIAGNOSIS — E119 Type 2 diabetes mellitus without complications: Secondary | ICD-10-CM

## 2016-09-25 DIAGNOSIS — Z853 Personal history of malignant neoplasm of breast: Secondary | ICD-10-CM

## 2016-09-25 MED ORDER — PRAVASTATIN SODIUM 20 MG PO TABS: 20.0000 mg | ORAL_TABLET | Freq: Every day | ORAL | 1 refills | Status: DC

## 2016-09-25 NOTE — Progress Notes (Signed)
Subjective:    Patient ID: Donna Fuller, female    DOB: 20-Feb-1941, 75 y.o.   MRN: [id number]  HPI  Donna Fuller is a 75 yr old female who presents today to establish care. She was previously followed by Dr. Dion Saucier.   1) HTN- maintained on amlodipine and lisinopril-hctz, furosemide. BP Readings from Last 3 Encounters:  09/25/16 140/74  02/14/16 146/67  04/19/15 183/67   2) Hyperlipidemia- Last LDL 68, trigs 338, total cholesterol 126, HDL 29- reviewed in care everywhere, drawn 03/18/16.  She reports that she is looking to get back to the GYN. Used to water aerobics.  Denies myalgias. She does have some thigh aching.    3) Hx breast CA- (S/p right sided mastectomy) She was diagnosed in 2007.  She did not have chemotherapy or radiation.  She sees Oncology at [facility].  She has an appointment 12/11 for left breast mammogram.   4) Hx spinal stenosis- She reports that she has daily low back pain.  Also has some chronic neck pain. She is main  5) DM2-The patient is maintained on humulin 70/30 50 units bid, metformin. She was switched from lantus to 70/30 back in early September due to cost.  Last A1C 5.6 on 08/05/16 (outside our system). Reports that her sugar this AM was 102, was 62 previously. She is requesting a new meter.  She has been having some low blood sugars.  She has been having diarrhea. Uses gabapentin for neuropathy.   Has numbness and pain in both hands. Has hx of carpal tunnel.  She does not wear wrist braces.   Iron deficiency anemia- maintained on iron supplement.  GERD- using prilosec 20mg  bid, reports fair control on her GERD.   Gout- maintained on allopurinol daily. Uses colchicine prn.    Review of Systems  Constitutional:       Weight has been "up and down."   HENT: Negative for rhinorrhea.        Was told "year round allergies"  Respiratory:       Reports chronic mild dry cough.   Gastrointestinal:       Some diarrhe 3 days ago. Resolved.     Genitourinary: Negative for dysuria and frequency.  Musculoskeletal: Positive for arthralgias and myalgias.  Neurological: Positive for headaches.  Hematological: Negative for adenopathy.  Psychiatric/Behavioral:       Denies current anxiety/depression     Past Medical History:  Diagnosis Date  . Arthritis   . Breast cancer ([place]) 2007  . Diabetes mellitus without complication ([place])   . Dysuria   . Hyperlipidemia   . Hypertension   . Other malignant neoplasm without specification of site   . Spinal stenosis      Social History   Social History  . Marital status: Married    Spouse name: Donna Fuller  . Number of children: 3  . Years of education: 12   Occupational History  . Not on file.   Social History Main Topics  . Smoking status: Former Research scientist (life sciences)  . Smokeless tobacco: Never Used  . Alcohol use No  . Drug use: No  . Sexual activity: Not Currently   Other Topics Concern  . Not on file   Social History Narrative   Marital Status:  Married Manufacturing systems engineer)    Children:  Daughter(1) Son (1)    Pets:  None    Living Situation: Lives with husband and daughter.     Occupation:  Retired Licensed conveyancer)    Education:  12th Grade    Tobacco Use/Exposure:  She used to smoke socially during the weekends but quit 40 years ago.    Alcohol Use:  Occasional   Drug Use:  None   Diet:  Regular   Exercise:  None   Hobbies:  Reading and playing volleyball                 Past Surgical History:  Procedure Laterality Date  . ABDOMINAL HYSTERECTOMY    . BREAST LUMPECTOMY    . KNEE ARTHROSCOPY    . MASTECTOMY     right side  . TUBAL LIGATION      Family History  Problem Relation Age of Onset  . Diabetes Mother   . Alzheimer's disease Mother   . Heart disease Father   . Hyperlipidemia Father   . Kidney disease Father   . Cancer Brother     lung  . Alzheimer's disease Brother   . Heart disease Brother     Allergies  Allergen Reactions  . Sulfa Antibiotics Swelling  . Novolin  [Insulin Nph Isophane & Regular] Rash    Current Outpatient Prescriptions on File Prior to Visit  Medication Sig Dispense Refill  . aspirin 81 MG tablet Take 81 mg by mouth daily.    . cyclobenzaprine (FLEXERIL) 10 MG tablet Take 1 tablet (10 mg total) by mouth at bedtime. 30 tablet 3  . gabapentin (NEURONTIN) 300 MG capsule Take 4 capsules (1,200 mg total) by mouth 3 (three) times daily. May take an additional tab in the evening (Patient taking differently: Take 300 mg by mouth 3 (three) times daily. May take an additional tab in the evening) 360 capsule 6  . omeprazole (PRILOSEC) 20 MG capsule Take 20 mg by mouth daily.    . simvastatin (ZOCOR) 10 MG tablet Take 10 mg by mouth daily.    . furosemide (LASIX) 20 MG tablet Take 1 tab po in the morning for increased swelling    . glucose blood (FREESTYLE INSULINX TEST) test strip Use as instructed 100 each 11  . potassium chloride SA (K-DUR,KLOR-CON) 20 MEQ tablet Take 1 tablet po with the Lasix     No current facility-administered medications on file prior to visit.     BP 140/74 (BP Location: Right Arm, Cuff Size: Large)   Pulse 76   Temp 97.8 F (36.6 C) (Oral)   Resp 16   Ht 5\' 2"  (1.575 m)   Wt 199 lb (90.3 kg)   SpO2 97% Comment: room air  BMI 36.40 kg/m       Objective:   Physical Exam  Constitutional: She is oriented to person, place, and time. She appears well-developed and well-nourished.  HENT:  Head: Normocephalic and atraumatic.  Mouth/Throat: No oropharyngeal exudate.  Eyes: No scleral icterus.  Cardiovascular: Normal rate, regular rhythm and normal heart sounds.   No murmur heard. Pulmonary/Chest: Effort normal and breath sounds normal. No respiratory distress. She has no wheezes.  Abdominal: Soft. Bowel sounds are normal. She exhibits no distension. There is no tenderness.  Musculoskeletal: She exhibits no edema.  Lymphadenopathy:    She has no cervical adenopathy.  Neurological: She is alert and oriented to  person, place, and time.  Skin: Skin is warm and dry.  Psychiatric: She has a normal mood and affect. Her behavior is normal. Judgment and thought content normal.          Assessment & Plan:

## 2016-09-25 NOTE — Progress Notes (Signed)
Pre visit review using our clinic review tool, if applicable. No additional management support is needed unless otherwise documented below in the visit note. 

## 2016-09-25 NOTE — Patient Instructions (Addendum)
Stop metformin. Call if you have recurrent sugars less than 80.  Stop simvastatin, begin atorvastatin.   Please complete lab work prior to leaving.  Add claritin 10mg  once daily for cough/allergies. Let us know if symptoms do not improve.

## 2016-09-26 DIAGNOSIS — K219 Gastro-esophageal reflux disease without esophagitis: Secondary | ICD-10-CM | POA: Insufficient documentation

## 2016-09-26 DIAGNOSIS — Z8669 Personal history of other diseases of the nervous system and sense organs: Secondary | ICD-10-CM | POA: Insufficient documentation

## 2016-09-26 DIAGNOSIS — Z853 Personal history of malignant neoplasm of breast: Secondary | ICD-10-CM | POA: Insufficient documentation

## 2016-09-26 DIAGNOSIS — M4800 Spinal stenosis, site unspecified: Secondary | ICD-10-CM | POA: Insufficient documentation

## 2016-09-26 DIAGNOSIS — E119 Type 2 diabetes mellitus without complications: Secondary | ICD-10-CM | POA: Insufficient documentation

## 2016-09-26 DIAGNOSIS — D509 Iron deficiency anemia, unspecified: Secondary | ICD-10-CM | POA: Insufficient documentation

## 2016-09-26 DIAGNOSIS — E785 Hyperlipidemia, unspecified: Secondary | ICD-10-CM | POA: Insufficient documentation

## 2016-09-26 NOTE — Assessment & Plan Note (Signed)
D/c simvastatin due to myalgia, begin pravastatin. Advised pt to let us know if myalgia does not improve with med change.

## 2016-09-26 NOTE — Assessment & Plan Note (Signed)
S/p right mastectomy, follow up as scheduled with oncology.  Keep upcoming appointment for left breast mammo in December.

## 2016-09-26 NOTE — Assessment & Plan Note (Signed)
Stable on PPI. Continue same.  

## 2016-09-26 NOTE — Assessment & Plan Note (Signed)
Stable, continue prn tylenol.

## 2016-09-26 NOTE — Assessment & Plan Note (Signed)
BP acceptable on current medications. Continue same.  Obtain follow up bmet.

## 2016-09-26 NOTE — Assessment & Plan Note (Signed)
Continue iron supplement.

## 2016-09-26 NOTE — Assessment & Plan Note (Signed)
Stable, continue current medications.  

## 2016-09-28 ENCOUNTER — Telehealth: Payer: Self-pay | Admitting: Family

## 2016-09-28 ENCOUNTER — Other Ambulatory Visit (INDEPENDENT_AMBULATORY_CARE_PROVIDER_SITE_OTHER): Payer: PPO

## 2016-09-28 DIAGNOSIS — E785 Hyperlipidemia, unspecified: Secondary | ICD-10-CM

## 2016-09-28 DIAGNOSIS — I10 Essential (primary) hypertension: Secondary | ICD-10-CM

## 2016-09-28 DIAGNOSIS — M1A9XX0 Chronic gout, unspecified, without tophus (tophi): Secondary | ICD-10-CM | POA: Diagnosis not present

## 2016-09-28 DIAGNOSIS — E781 Pure hyperglyceridemia: Secondary | ICD-10-CM | POA: Insufficient documentation

## 2016-09-28 LAB — LIPID PANEL
Cholesterol: 145 mg/dL (ref 0–200)
HDL: 43.2 mg/dL (ref >39.00)
NonHDL: 101.97
Total CHOL/HDL Ratio: 3
Triglycerides: 309 mg/dL — ABNORMAL HIGH (ref 0.0–149.0)
VLDL: 61.8 mg/dL — ABNORMAL HIGH (ref 0.0–40.0)

## 2016-09-28 LAB — BASIC METABOLIC PANEL
BUN: 27 mg/dL — ABNORMAL HIGH (ref 6–23)
CO2: 28 mEq/L (ref 19–32)
Calcium: 9.5 mg/dL (ref 8.4–10.5)
Chloride: 105 mEq/L (ref 96–112)
Creatinine, Ser: 1.41 mg/dL — ABNORMAL HIGH (ref 0.40–1.20)
GFR: 46.79 mL/min — ABNORMAL LOW (ref >60.00)
Glucose, Bld: 120 mg/dL — ABNORMAL HIGH (ref 70–99)
Potassium: 4.3 mEq/L (ref 3.5–5.1)
Sodium: 142 mEq/L (ref 135–145)

## 2016-09-28 LAB — URIC ACID: Uric Acid, Serum: 3.1 mg/dL (ref 2.4–7.0)

## 2016-09-28 LAB — LDL CHOLESTEROL, DIRECT: Direct LDL: 66 mg/dL

## 2016-09-28 NOTE — Telephone Encounter (Signed)
Gout test looks good. Continue current dose of allopurinol. Kidney function is impaired but stable compared to her labs at [facility]. Continue to avoid nsaids.

## 2016-09-28 NOTE — Telephone Encounter (Signed)
Your triglycerides are mildly elevated.  Please work on avoiding concentrated sweets, and limiting white carbs (rice/bread/pasta/potatoes).  Instead substitute whole grain versions with reasonable portions.  

## 2016-09-29 ENCOUNTER — Telehealth: Payer: Self-pay | Admitting: Family

## 2016-09-29 MED ORDER — ONETOUCH ULTRASOFT LANCETS MISC: 5 refills | Status: DC

## 2016-09-29 MED ORDER — GLUCOSE BLOOD VI STRP: ORAL_STRIP | 5 refills | Status: DC

## 2016-09-29 MED ORDER — ONETOUCH ULTRA SYSTEM W/DEVICE KIT
1.0000 | PACK | Freq: Once | 0 refills | Status: AC
Start: 2016-09-29 — End: 2016-09-29

## 2016-09-29 MED ORDER — ACCU-CHEK SOFTCLIX LANCET DEV MISC: 5 refills | Status: DC

## 2016-09-29 MED ORDER — ACCU-CHEK AVIVA PLUS W/DEVICE KIT
PACK | 0 refills | Status: DC
Start: 2016-09-29 — End: 2016-09-29

## 2016-09-29 NOTE — Telephone Encounter (Signed)
Notified pt and she voices understanding. States pharmacy never received pravastatin prescription we sent to them on Friday. Spoke with Caryl Pina at [facility] and verified they received Rx and placed it on hold. Advised them that Rx should be filled as this was a medication change for pt. They will fill Rx. Notified pt.

## 2016-09-29 NOTE — Telephone Encounter (Signed)
Relation to QD:[facility] Call back number:[phone] Pharmacy:  Reason for call:  Patient requesting blood sugar kit please send to  [facility] ([place] [place] [facility] [place] [phone] (Phone) [phone] (Fax)

## 2016-09-29 NOTE — Telephone Encounter (Signed)
Patient states that there was a machine ordered that the insurance company would not pay for and patient needs the order called in for the diabetic machine that the insurance does cover. Please call patient with questions...   Phone: [phone]

## 2016-09-29 NOTE — Telephone Encounter (Signed)
Rxs sent

## 2016-09-29 NOTE — Telephone Encounter (Signed)
Received fax from [facility] that formulary alternatives are one touch and freestyle. Rxs sent for Onetouch meter, strips and supplies. Pt has been notified.

## 2016-09-29 NOTE — Addendum Note (Signed)
Addended by: Kelle Darting A on: 09/29/2016 02:21 PM   Modules accepted: Orders

## 2016-09-30 MED ORDER — ONETOUCH DELICA LANCETS 33G MISC
3 refills | Status: DC
Start: 2016-09-30 — End: 2021-10-21

## 2016-09-30 NOTE — Telephone Encounter (Signed)
Received fax from [facility] request to change onetouch ultra lancet to one touch delica. Rx changed.

## 2016-09-30 NOTE — Addendum Note (Signed)
Addended by: Kelle Darting A on: 09/30/2016 02:06 PM   Modules accepted: Orders

## 2016-11-05 DIAGNOSIS — H25013 Cortical age-related cataract, bilateral: Secondary | ICD-10-CM | POA: Diagnosis not present

## 2016-11-05 DIAGNOSIS — H2513 Age-related nuclear cataract, bilateral: Secondary | ICD-10-CM | POA: Diagnosis not present

## 2016-11-05 DIAGNOSIS — H401131 Primary open-angle glaucoma, bilateral, mild stage: Secondary | ICD-10-CM | POA: Diagnosis not present

## 2016-11-09 DIAGNOSIS — Z1231 Encounter for screening mammogram for malignant neoplasm of breast: Secondary | ICD-10-CM | POA: Diagnosis not present

## 2016-12-29 ENCOUNTER — Ambulatory Visit (INDEPENDENT_AMBULATORY_CARE_PROVIDER_SITE_OTHER): Payer: PPO | Admitting: Family

## 2016-12-29 ENCOUNTER — Encounter: Payer: Self-pay | Admitting: Family

## 2016-12-29 VITALS — BP 134/48 | HR 81 | Temp 98.3°F | Resp 18 | Ht 62.0 in | Wt 209.2 lb

## 2016-12-29 DIAGNOSIS — Z0000 Encounter for general adult medical examination without abnormal findings: Secondary | ICD-10-CM | POA: Diagnosis not present

## 2016-12-29 DIAGNOSIS — E119 Type 2 diabetes mellitus without complications: Secondary | ICD-10-CM

## 2016-12-29 DIAGNOSIS — E781 Pure hyperglyceridemia: Secondary | ICD-10-CM | POA: Diagnosis not present

## 2016-12-29 DIAGNOSIS — E1165 Type 2 diabetes mellitus with hyperglycemia: Secondary | ICD-10-CM

## 2016-12-29 DIAGNOSIS — M2550 Pain in unspecified joint: Secondary | ICD-10-CM | POA: Diagnosis not present

## 2016-12-29 DIAGNOSIS — E785 Hyperlipidemia, unspecified: Secondary | ICD-10-CM | POA: Diagnosis not present

## 2016-12-29 DIAGNOSIS — IMO0001 Reserved for inherently not codable concepts without codable children: Secondary | ICD-10-CM

## 2016-12-29 DIAGNOSIS — I10 Essential (primary) hypertension: Secondary | ICD-10-CM

## 2016-12-29 DIAGNOSIS — Z780 Asymptomatic menopausal state: Secondary | ICD-10-CM

## 2016-12-29 DIAGNOSIS — Z794 Long term (current) use of insulin: Secondary | ICD-10-CM

## 2016-12-29 MED ORDER — OMEPRAZOLE 20 MG PO CPDR: 20.0000 mg | DELAYED_RELEASE_CAPSULE | Freq: Every day | ORAL | 5 refills | Status: DC

## 2016-12-29 NOTE — Progress Notes (Signed)
Pre visit review using our clinic review tool, if applicable. No additional management support is needed unless otherwise documented below in the visit note. 

## 2016-12-29 NOTE — Progress Notes (Signed)
Noted and agree. 

## 2016-12-29 NOTE — Progress Notes (Signed)
Subjective:   Donna Fuller is a 76 y.o. female who presents for an Initial Medicare Annual Wellness Visit.  Review of Systems    No ROS.  Medicare Wellness Visit. Cardiac Risk Factors include: advanced age (>34men, >90 women);diabetes mellitus;dyslipidemia;hypertension;obesity (BMI >30kg/m2);sedentary lifestyle Sleep patterns: Restless sleep. Pt states she has discussed with pcp.  Home Safety/Smoke Alarms:  Feels safe in home. Smoke alarms in place.  Living environment; residence and Firearm Safety: Lives at home with husband and dtr in 2 story. Master on 1st.  Seat Belt Safety/Bike Helmet: Wears seat belt.   Counseling:   Eye Exam- Reading glasses. [place] every 4 months. Dental- Looking for new dentist. Resource list provided.  Female:  .  Pap-  No longer getting screened per pt.     Mammo- Last 11/09/16 at [facility] in [place]-normal per pt. Report requested.  Dexa scan-  Last 12/01/11: No result on file.  Ordered today.   CCS- Last 11/30/05: 1 Polyp Removed - Repeat in 3 years (estimated date provided by patient) . Pt states she has had one in 2016 but doesn't recall name of place. States she is no longer getting screened.     Objective:    Today's Vitals   12/29/16 1236  BP: (!) 134/48  Pulse: 81  Resp: 18  Temp: 98.3 F (36.8 C)  TempSrc: Oral  SpO2: 100%  Weight: 209 lb 3.2 oz (94.9 kg)  Height: 5\' 2"  (1.575 m)   Body mass index is 38.26 kg/m.   Current Medications (verified) Outpatient Encounter Prescriptions as of 12/29/2016  Medication Sig  . acetaminophen (TYLENOL) 650 MG CR tablet Take 650 mg by mouth daily.  Marland Kitchen allopurinol (ZYLOPRIM) 300 MG tablet Take 300 mg by mouth daily.  Marland Kitchen amLODipine (NORVASC) 5 MG tablet Take 0.25 mg by mouth daily.  Marland Kitchen aspirin 81 MG tablet Take 81 mg by mouth daily.  . Cholecalciferol (VITAMIN D-3) 5000 units TABS Take 1 tablet by mouth daily.  . colchicine 0.6 MG tablet Take 0.6 mg by mouth 2 (two)  times daily.  . cyclobenzaprine (FLEXERIL) 10 MG tablet Take 1 tablet (10 mg total) by mouth at bedtime.  . diphenhydramine-acetaminophen (TYLENOL PM) 25-500 MG TABS tablet Take 1 tablet by mouth at bedtime as needed.  . ferrous sulfate 325 (65 FE) MG tablet Take 325 mg by mouth daily with breakfast.  . gabapentin (NEURONTIN) 300 MG capsule Take 4 capsules (1,200 mg total) by mouth 3 (three) times daily. May take an additional tab in the evening (Patient taking differently: Take 300 mg by mouth 3 (three) times daily. May take an additional tab in the evening)  . glucose blood (ONE TOUCH ULTRA TEST) test strip Use as instructed to check blood sugar 2-3 times a day.  DX E11.9  . insulin NPH-regular Human (NOVOLIN 70/30) (70-30) 100 UNIT/ML injection Inject into the skin. Take 35 units every morning and 30 units every night.  Marland Kitchen lisinopril-hydrochlorothiazide (PRINZIDE,ZESTORETIC) 20-25 MG tablet Take 1 tablet by mouth every morning.  . loratadine (CLARITIN) 10 MG tablet Take 10 mg by mouth daily.  . Magnesium 250 MG TABS Take 1 tablet by mouth daily.  Marland Kitchen omeprazole (PRILOSEC) 20 MG capsule Take 1 capsule (20 mg total) by mouth daily.  Glory Rosebush DELICA LANCETS [id number] MISC Use as instructed to check blood sugar 2-3 times a day.  DX E11.9  . pravastatin (PRAVACHOL) 20 MG tablet Take 1 tablet (20 mg total) by mouth daily.  Marland Kitchen  vitamin C (ASCORBIC ACID) 500 MG tablet Take 500 mg by mouth daily.  . [DISCONTINUED] omeprazole (PRILOSEC) 20 MG capsule Take 20 mg by mouth daily.  . potassium chloride SA (K-DUR,KLOR-CON) 20 MEQ tablet Take 1 tablet po with the Lasix  . [DISCONTINUED] furosemide (LASIX) 20 MG tablet Take 1 tab po in the morning for increased swelling   No facility-administered encounter medications on file as of 12/29/2016.     Allergies (verified) Sulfa antibiotics and Novolin [insulin nph isophane & regular]   History: Past Medical History:  Diagnosis Date  . Arthritis   . Breast cancer  ([place]) 2007  . Diabetes mellitus without complication ([place])   . Dysuria   . Hyperlipidemia   . Hypertension   . Other malignant neoplasm without specification of site   . Spinal stenosis    Past Surgical History:  Procedure Laterality Date  . ABDOMINAL HYSTERECTOMY    . BREAST LUMPECTOMY    . KNEE ARTHROSCOPY    . MASTECTOMY     right side  . TUBAL LIGATION     Family History  Problem Relation Age of Onset  . Diabetes Mother   . Alzheimer's disease Mother   . Heart disease Father   . Hyperlipidemia Father   . Kidney disease Father   . Cancer Brother     lung  . Alzheimer's disease Brother   . Heart disease Brother    Social History   Occupational History  . Not on file.   Social History Main Topics  . Smoking status: Former Research scientist (life sciences)  . Smokeless tobacco: Never Used  . Alcohol use No  . Drug use: No  . Sexual activity: Not Currently    Tobacco Counseling Counseling given: No   Activities of Daily Living In your present state of health, do you have any difficulty performing the following activities: 12/29/2016  Hearing? N  Vision? N  Difficulty concentrating or making decisions? N  Walking or climbing stairs? N  Dressing or bathing? N  Doing errands, shopping? N  Preparing Food and eating ? N  Using the Toilet? N  In the past six months, have you accidently leaked urine? Y  Do you have problems with loss of bowel control? N  Managing your Medications? N  Managing your Finances? N  Housekeeping or managing your Housekeeping? N  Some recent data might be hidden    Immunizations and Health Maintenance Immunization History  Administered Date(s) Administered  . Influenza,inj,Quad PF,36+ Mos 08/30/2016   Health Maintenance Due  Topic Date Due  . OPHTHALMOLOGY EXAM  05/30/2014  . HEMOGLOBIN A1C  12/21/2014    Patient Care Team: Debbrah Alar, NP as PCP - General (Internal Medicine)  Indicate any recent Medical Services you may have received from  other than [facility] providers in the past year (date may be approximate).     Assessment:   This is a routine wellness examination for [place]. Physical assessment deferred to PCP.   Hearing/Vision screen No exam data present  Dietary issues and exercise activities discussed: Current Exercise Habits: The patient does not participate in regular exercise at present (Has not since Sept 2017), Exercise limited by: None identified Diet (meal preparation, eat out, water intake, caffeinated beverages, dairy products, fruits and vegetables): Pt states she has been eating and snacking . Pt states she knows what to eat but just needs to do better.       Goals    . Start going back to the [employer] at least  twice per week.      Depression Screen PHQ 2/9 Scores 12/29/2016 12/29/2016 02/09/2014  PHQ - 2 Score 1 1 0    Fall Risk Fall Risk  12/29/2016 12/29/2016 02/09/2014  Falls in the past year? Yes Yes No  Number falls in past yr: 1 1 -  Injury with Fall? No No -  Follow up Education provided;Falls prevention discussed - -    Cognitive Function: MMSE - Mini Mental State Exam 12/29/2016  Orientation to time 5  Orientation to Place 5  Registration 3  Attention/ Calculation 5  Recall 3  Language- name 2 objects 2  Language- repeat 1  Language- follow 3 step command 3  Language- read & follow direction 1  Write a sentence 1  Copy design 0  Total score 29        Screening Tests Health Maintenance  Topic Date Due  . OPHTHALMOLOGY EXAM  05/30/2014  . HEMOGLOBIN A1C  12/21/2014  . PNA vac Low Risk Adult (1 of 2 - PCV13) 01/27/2017 (Originally 12/21/2006)  . COLONOSCOPY  12/29/2017 (Originally 12/01/2015)  . ZOSTAVAX  12/29/2017 (Originally 12/21/2001)  . TETANUS/TDAP  12/29/2017 (Originally 12/21/1960)  . FOOT EXAM  12/29/2017  . INFLUENZA VACCINE  Completed  . DEXA SCAN  Completed      Plan:     Schedule follow up appointment with Debbrah Alar NP.  Schedule Bone Density Scan  downstairs.  Continue to eat heart healthy diet (full of fruits, vegetables, whole grains, lean protein, water--limit salt, fat, and sugar intake) and increase physical activity as tolerated.  Bring a copy of your advance directives to your next office visit.  Continue doing brain stimulating activities (puzzles, reading, adult coloring books, staying active) to keep memory sharp.    During the course of the visit, [place] was educated and counseled about the following appropriate screening and preventive services:   Vaccines to include Pneumoccal, Influenza, Hepatitis B, Td, Zostavax, HCV  Cardiovascular disease screening  Colorectal cancer screening  Bone density screening  Diabetes screening  Glaucoma screening  Mammography/PAP  Nutrition counseling  Patient Instructions (the written plan) were given to the patient.    Shela Nevin, [place]   12/29/2016

## 2016-12-29 NOTE — Progress Notes (Signed)
Subjective:    Patient ID: Donna Fuller, female    DOB: September 11, 1941, 76 y.o.   MRN: [id number]  HPI  [name] is a 76 yr old female who presents today for follow up of multiple medical problems.  1) HTN- maintained on zestoretic, amlodipine.  BP Readings from Last 3 Encounters:  12/29/16 (!) 134/48  09/25/16 140/74  02/14/16 146/67   2) Hyperlipidemia- maintained on pravastatin.  Lab Results  Component Value Date   CHOL 145 09/28/2016   HDL 43.20 09/28/2016   LDLDIRECT 66.0 09/28/2016   TRIG 309.0 (H) 09/28/2016   CHOLHDL 3 09/28/2016   3) DM2- reports that her sugar has been 200-300. Notes that she is not eating healthy.   Lab Results  Component Value Date   HGBA1C 7.6 06/20/2014   HGBA1C 6.0 03/21/2014   Lab Results  Component Value Date   CREATININE 1.41 (H) 09/28/2016   4) Abdominal pain-  Notes abdominal pain which radiates around to her back x 3 months.  Reports that she has had it off and on/.    5) Chronic pain- secondary to spinal stenosis and arthritis.  She uses tylenol but this does not really help her much.      Review of Systems See HPI  Past Medical History:  Diagnosis Date  . Arthritis   . Breast cancer ([place]) 2007  . Diabetes mellitus without complication ([place])   . Dysuria   . Hyperlipidemia   . Hypertension   . Other malignant neoplasm without specification of site   . Spinal stenosis      Social History   Social History  . Marital status: Married    Spouse name: Donna Fuller  . Number of children: 3  . Years of education: 12   Occupational History  . Not on file.   Social History Main Topics  . Smoking status: Former Research scientist (life sciences)  . Smokeless tobacco: Never Used  . Alcohol use No  . Drug use: No  . Sexual activity: Not Currently   Other Topics Concern  . Not on file   Social History Narrative   Marital Status:  Married Manufacturing systems engineer)    Children:  Daughter(1) Son (1)    Pets:  None    Living Situation: Lives with husband and  daughter.     Occupation:  Retired Licensed conveyancer)    Education: 12th Grade    Tobacco Use/Exposure:  She used to smoke socially during the weekends but quit 40 years ago.    Alcohol Use:  Occasional   Drug Use:  None   Diet:  Regular   Exercise:  None   Hobbies:  Reading and playing volleyball                 Past Surgical History:  Procedure Laterality Date  . ABDOMINAL HYSTERECTOMY    . BREAST LUMPECTOMY    . KNEE ARTHROSCOPY    . MASTECTOMY     right side  . TUBAL LIGATION      Family History  Problem Relation Age of Onset  . Diabetes Mother   . Alzheimer's disease Mother   . Heart disease Father   . Hyperlipidemia Father   . Kidney disease Father   . Cancer Brother     lung  . Alzheimer's disease Brother   . Heart disease Brother     Allergies  Allergen Reactions  . Sulfa Antibiotics Swelling  . Novolin [Insulin Nph Isophane & Regular] Rash    Current Outpatient  Prescriptions on File Prior to Visit  Medication Sig Dispense Refill  . acetaminophen (TYLENOL) 650 MG CR tablet Take 650 mg by mouth daily.    Marland Kitchen allopurinol (ZYLOPRIM) 300 MG tablet Take 300 mg by mouth daily.    Marland Kitchen amLODipine (NORVASC) 5 MG tablet Take 0.25 mg by mouth daily.    Marland Kitchen aspirin 81 MG tablet Take 81 mg by mouth daily.    . colchicine 0.6 MG tablet Take 0.6 mg by mouth 2 (two) times daily.    . cyclobenzaprine (FLEXERIL) 10 MG tablet Take 1 tablet (10 mg total) by mouth at bedtime. 30 tablet 3  . diphenhydramine-acetaminophen (TYLENOL PM) 25-500 MG TABS tablet Take 1 tablet by mouth at bedtime as needed.    . ferrous sulfate 325 (65 FE) MG tablet Take 325 mg by mouth daily with breakfast.    . gabapentin (NEURONTIN) 300 MG capsule Take 4 capsules (1,200 mg total) by mouth 3 (three) times daily. May take an additional tab in the evening (Patient taking differently: Take 300 mg by mouth 3 (three) times daily. May take an additional tab in the evening) 360 capsule 6  . glucose blood (ONE TOUCH ULTRA  TEST) test strip Use as instructed to check blood sugar 2-3 times a day.  DX E11.9 100 each 5  . insulin NPH-regular Human (NOVOLIN 70/30) (70-30) 100 UNIT/ML injection Inject into the skin. Take 35 units every morning and 30 units every night.    Marland Kitchen lisinopril-hydrochlorothiazide (PRINZIDE,ZESTORETIC) 20-25 MG tablet Take 1 tablet by mouth every morning.    Glory Rosebush DELICA LANCETS 02O MISC Use as instructed to check blood sugar 2-3 times a day.  DX E11.9 100 each 3  . pravastatin (PRAVACHOL) 20 MG tablet Take 1 tablet (20 mg total) by mouth daily. 90 tablet 1  . vitamin C (ASCORBIC ACID) 500 MG tablet Take 500 mg by mouth daily.    . potassium chloride SA (K-DUR,KLOR-CON) 20 MEQ tablet Take 1 tablet po with the Lasix     No current facility-administered medications on file prior to visit.     BP (!) 134/48 (BP Location: Right Arm, Cuff Size: Large)   Pulse 81   Temp 98.3 F (36.8 C) (Oral)   Resp 18   Ht '5\' 2"'  (1.575 m)   Wt 209 lb 3.2 oz (94.9 kg)   SpO2 100% Comment: room air  BMI 38.26 kg/m       Objective:   Physical Exam  Constitutional: She is oriented to person, place, and time. She appears well-developed and well-nourished.  HENT:  Head: Normocephalic and atraumatic.  Cardiovascular: Normal rate, regular rhythm and normal heart sounds.   No murmur heard. Pulmonary/Chest: Effort normal and breath sounds normal. No respiratory distress. She has no wheezes.  Abdominal: Soft. Bowel sounds are normal.  Mild generalized tenderness without guarding.   Musculoskeletal: She exhibits no edema.  Neurological: She is alert and oriented to person, place, and time.  Psychiatric: She has a normal mood and affect. Her behavior is normal. Judgment and thought content normal.          Assessment & Plan:  Joint pain- will obtain ANA, RA, ESR to rule out autoimmune etiology.   Medicare wellness visit is reviewed and agree.

## 2016-12-29 NOTE — Patient Instructions (Addendum)
Please return tomorrow at 9:30 AM. You can complete your lab work at that time.   Schedule follow up appointment with Debbrah Alar NP.  Schedule Bone Density Scan downstairs.  Continue to eat heart healthy diet (full of fruits, vegetables, whole grains, lean protein, water--limit salt, fat, and sugar intake) and increase physical activity as tolerated.  Bring a copy of your advance directives to your next office visit.  Continue doing brain stimulating activities (puzzles, reading, adult coloring books, staying active) to keep memory sharp.

## 2016-12-30 ENCOUNTER — Other Ambulatory Visit: Payer: PPO

## 2016-12-30 DIAGNOSIS — M2550 Pain in unspecified joint: Secondary | ICD-10-CM | POA: Diagnosis not present

## 2016-12-30 LAB — LIPID PANEL
Cholesterol: 166 mg/dL (ref 0–200)
HDL: 46.2 mg/dL (ref >39.00)
NonHDL: 119.5
Total CHOL/HDL Ratio: 4
Triglycerides: 202 mg/dL — ABNORMAL HIGH (ref 0.0–149.0)
VLDL: 40.4 mg/dL — ABNORMAL HIGH (ref 0.0–40.0)

## 2016-12-30 LAB — SEDIMENTATION RATE: Sed Rate: 23 mm/hr (ref 0–30)

## 2016-12-30 LAB — BASIC METABOLIC PANEL
BUN: 26 mg/dL — ABNORMAL HIGH (ref 6–23)
CO2: 30 mEq/L (ref 19–32)
Calcium: 9.9 mg/dL (ref 8.4–10.5)
Chloride: 105 mEq/L (ref 96–112)
Creatinine, Ser: 1.16 mg/dL (ref 0.40–1.20)
GFR: 58.57 mL/min — ABNORMAL LOW (ref >60.00)
Glucose, Bld: 66 mg/dL — ABNORMAL LOW (ref 70–99)
Potassium: 4.2 mEq/L (ref 3.5–5.1)
Sodium: 141 mEq/L (ref 135–145)

## 2016-12-30 LAB — HEMOGLOBIN A1C: Hgb A1c MFr Bld: 6.9 % — ABNORMAL HIGH (ref 4.6–6.5)

## 2016-12-30 LAB — LDL CHOLESTEROL, DIRECT: Direct LDL: 76 mg/dL

## 2016-12-31 ENCOUNTER — Ambulatory Visit: Payer: PPO | Admitting: *Deleted

## 2016-12-31 ENCOUNTER — Other Ambulatory Visit: Payer: PPO

## 2016-12-31 LAB — ANA: Anti Nuclear Antibody(ANA): NEGATIVE

## 2016-12-31 LAB — RHEUMATOID FACTOR: Rhuematoid fact SerPl-aCnc: <14 IU/mL (ref <14)

## 2016-12-31 NOTE — Assessment & Plan Note (Signed)
Obtain follow up A1C.   

## 2016-12-31 NOTE — Assessment & Plan Note (Signed)
Uncontrolled. Repeat lipid panel, continue pravastatin.

## 2016-12-31 NOTE — Assessment & Plan Note (Signed)
Stable on current meds.  Continue same. 

## 2017-01-01 ENCOUNTER — Encounter: Payer: Self-pay | Admitting: Family

## 2017-01-02 ENCOUNTER — Telehealth: Payer: Self-pay | Admitting: Family

## 2017-01-02 DIAGNOSIS — R109 Unspecified abdominal pain: Secondary | ICD-10-CM

## 2017-01-02 NOTE — Telephone Encounter (Signed)
please let pt know that I am ordering a CT of her abdomen to further evaluate her pain.

## 2017-01-04 DIAGNOSIS — C50911 Malignant neoplasm of unspecified site of right female breast: Secondary | ICD-10-CM | POA: Diagnosis not present

## 2017-01-04 NOTE — Telephone Encounter (Signed)
Notified pt and she is agreeable to proceed with CT.

## 2017-01-04 NOTE — Telephone Encounter (Signed)
Pt tel [phone] Pt was returning call and wanted to speak with nurse, pt states is going to be out of her house at 10:15 for another doctor's appt. Pt would like to be called before 10:15am.

## 2017-01-05 ENCOUNTER — Ambulatory Visit (HOSPITAL_BASED_OUTPATIENT_CLINIC_OR_DEPARTMENT_OTHER)
Admission: RE | Admit: 2017-01-05 | Discharge: 2017-01-05 | Disposition: A | Discharge status: Home / Self Care | Payer: PPO | Source: Ambulatory Visit | Attending: Family | Admitting: Family

## 2017-01-05 DIAGNOSIS — Z780 Asymptomatic menopausal state: Secondary | ICD-10-CM | POA: Insufficient documentation

## 2017-01-05 DIAGNOSIS — Z87891 Personal history of nicotine dependence: Secondary | ICD-10-CM | POA: Diagnosis not present

## 2017-01-05 DIAGNOSIS — E119 Type 2 diabetes mellitus without complications: Secondary | ICD-10-CM | POA: Diagnosis not present

## 2017-01-05 DIAGNOSIS — E2839 Other primary ovarian failure: Secondary | ICD-10-CM | POA: Diagnosis not present

## 2017-01-14 DIAGNOSIS — D509 Iron deficiency anemia, unspecified: Secondary | ICD-10-CM | POA: Diagnosis not present

## 2017-01-14 DIAGNOSIS — E119 Type 2 diabetes mellitus without complications: Secondary | ICD-10-CM | POA: Diagnosis not present

## 2017-01-14 DIAGNOSIS — Z9011 Acquired absence of right breast and nipple: Secondary | ICD-10-CM | POA: Diagnosis not present

## 2017-01-14 DIAGNOSIS — E785 Hyperlipidemia, unspecified: Secondary | ICD-10-CM | POA: Diagnosis not present

## 2017-01-14 DIAGNOSIS — Z86000 Personal history of in-situ neoplasm of breast: Secondary | ICD-10-CM | POA: Diagnosis not present

## 2017-01-14 DIAGNOSIS — N62 Hypertrophy of breast: Secondary | ICD-10-CM | POA: Diagnosis not present

## 2017-01-14 DIAGNOSIS — I10 Essential (primary) hypertension: Secondary | ICD-10-CM | POA: Diagnosis not present

## 2017-01-18 ENCOUNTER — Telehealth: Payer: Self-pay | Admitting: Family

## 2017-01-18 ENCOUNTER — Encounter (HOSPITAL_BASED_OUTPATIENT_CLINIC_OR_DEPARTMENT_OTHER): Payer: Self-pay

## 2017-01-18 ENCOUNTER — Ambulatory Visit (HOSPITAL_BASED_OUTPATIENT_CLINIC_OR_DEPARTMENT_OTHER)
Admission: RE | Admit: 2017-01-18 | Discharge: 2017-01-18 | Disposition: A | Discharge status: Home / Self Care | Payer: PPO | Source: Ambulatory Visit | Attending: Family | Admitting: Family

## 2017-01-18 DIAGNOSIS — R109 Unspecified abdominal pain: Secondary | ICD-10-CM

## 2017-01-18 DIAGNOSIS — I700 Atherosclerosis of aorta: Secondary | ICD-10-CM | POA: Insufficient documentation

## 2017-01-18 DIAGNOSIS — M419 Scoliosis, unspecified: Secondary | ICD-10-CM | POA: Diagnosis not present

## 2017-01-18 DIAGNOSIS — R188 Other ascites: Secondary | ICD-10-CM

## 2017-01-18 DIAGNOSIS — K5730 Diverticulosis of large intestine without perforation or abscess without bleeding: Secondary | ICD-10-CM | POA: Diagnosis not present

## 2017-01-18 DIAGNOSIS — R197 Diarrhea, unspecified: Secondary | ICD-10-CM | POA: Diagnosis not present

## 2017-01-18 MED ORDER — IOPAMIDOL (ISOVUE-300) INJECTION 61%
100.0000 mL | Freq: Once | INTRAVENOUS | Status: AC | PRN
  Administered 2017-01-18: 100 mL via INTRAVENOUS

## 2017-01-18 NOTE — Telephone Encounter (Signed)
CT abd/pelvis shows a small pocket of fluid noted in the right side of her pelvis. I am not sure that this explains her pain, however, I would like her to see GYN to see if they recommend any further testing.

## 2017-01-18 NOTE — Telephone Encounter (Signed)
Left message for pt to return my call.

## 2017-01-18 NOTE — Telephone Encounter (Signed)
Notified pt and she is agreeable to proceed with referral. 

## 2017-01-22 ENCOUNTER — Telehealth: Payer: Self-pay | Admitting: Family

## 2017-01-22 NOTE — Telephone Encounter (Signed)
Melissa-- looks like we haven't prescribed for pt before. Please advise if ok to refill?

## 2017-01-22 NOTE — Telephone Encounter (Signed)
Relation to [id number] Call back number:[phone] Pharmacy: \ [facility] ([address]), [place] [id number] (Phone) [phone] (Fax)     Reason for call:  Patient requesting a refill allopurinol (ZYLOPRIM) 300 MG tablet

## 2017-01-27 MED ORDER — ALLOPURINOL 300 MG PO TABS: 300.0000 mg | ORAL_TABLET | Freq: Every day | ORAL | 2 refills | Status: DC

## 2017-01-27 NOTE — Addendum Note (Signed)
Addended by: Debbrah Alar on: 01/27/2017 05:09 PM   Modules accepted: Orders

## 2017-01-27 NOTE — Telephone Encounter (Signed)
Patient calling back to see if she can get the Rx for Allopurinol.

## 2017-03-08 ENCOUNTER — Ambulatory Visit (HOSPITAL_BASED_OUTPATIENT_CLINIC_OR_DEPARTMENT_OTHER)
Admission: RE | Admit: 2017-03-08 | Discharge: 2017-03-08 | Disposition: A | Discharge status: Home / Self Care | Payer: PPO | Source: Ambulatory Visit | Attending: Obstetrics & Gynecology | Admitting: Obstetrics & Gynecology

## 2017-03-08 ENCOUNTER — Ambulatory Visit (INDEPENDENT_AMBULATORY_CARE_PROVIDER_SITE_OTHER): Payer: PPO | Admitting: Obstetrics & Gynecology

## 2017-03-08 ENCOUNTER — Encounter: Payer: Self-pay | Admitting: Obstetrics & Gynecology

## 2017-03-08 VITALS — BP 144/58 | HR 73 | Ht 62.5 in | Wt 212.0 lb

## 2017-03-08 DIAGNOSIS — R188 Other ascites: Secondary | ICD-10-CM

## 2017-03-08 DIAGNOSIS — Z90722 Acquired absence of ovaries, bilateral: Secondary | ICD-10-CM | POA: Diagnosis not present

## 2017-03-08 DIAGNOSIS — Z90710 Acquired absence of both cervix and uterus: Secondary | ICD-10-CM | POA: Diagnosis not present

## 2017-03-08 NOTE — Progress Notes (Signed)
History:  76 y.o. No obstetric history on file. here today for eval of fluid collection on CT scan.  Pt is not sure why the CT scan was ordered.  Pt reports itching around her waist and into her back.  Pt was having pain in her abd and thinks that that could have led to the CT.  Pt still c/o pain in that region which is all on her left side well above her umbilicus.    Pt s/p hyst for fibroids in the 1970's. Pt denies pelvic pain. Pt denies weight loss. No fever or chills at night.   She is not sexually active and denies GYN problems since her hyst.  She reports that her ovaries were not removed.  The following portions of the patient's history were reviewed and updated as appropriate: allergies, current medications, past family history, past medical history, past social history, past surgical history and problem list.  Review of Systems:  Pertinent items are noted in HPI.   Objective:  Physical Exam Blood pressure (!) 144/58, pulse 73, height 5' 2.5" (1.588 m), weight 212 lb (96.2 kg). BP (!) 144/58 (BP Location: Left Arm, Patient Position: Sitting)   Pulse 73   Ht 5' 2.5" (1.588 m)   Wt 212 lb (96.2 kg)   BMI 38.16 kg/m  CONSTITUTIONAL: Well-developed, well-nourished female in no acute distress.  HENT:  Normocephalic, atraumatic EYES: Conjunctivae and EOM are normal. No scleral icterus.  NECK: Normal range of motion SKIN: Skin is warm and dry. No rash noted. Not diaphoretic.No pallor. Sanders: Alert and oriented to person, place, and time. Normal coordination.  Abd: obese, soft, nontender and nondistended Pelvic: Normal appearing external genitalia; normal appearing vaginal mucosa. Her cervix is surgically absent. Normal discharge. No palpable masses or adnexal tenderness  Labs and Imaging 01/18/2017 CLINICAL DATA:  76 year old diabetic hypertensive female with abdominal distention and lower abdominal pain (left lower quadrant greater than right) with some diarrhea for the past 5-6  months. Breast cancer post mastectomy. Partial hysterectomy.  EXAM: CT ABDOMEN AND PELVIS WITH CONTRAST  TECHNIQUE: Multidetector CT imaging of the abdomen and pelvis was performed using the standard protocol following bolus administration of intravenous contrast.  CONTRAST:  165mL ISOVUE-300 IOPAMIDOL (ISOVUE-300) INJECTION 61%  COMPARISON:  None.  FINDINGS: Lower chest: Basilar atelectasis/scarring minimally changed from prior exam. Post right mastectomy. Mild coronary artery calcifications. Heart size within normal limits.  Hepatobiliary: Mild fatty infiltration. Minimally lobulated contour. No focal hepatic lesion or calcified gallstone.  Pancreas: No pancreatic mass or inflammation.  Spleen: No mass or enlargement.  Adrenals/Urinary Tract: Small renal cysts without change. No worrisome renal or adrenal lesion. No hydronephrosis or obstructing stone. Noncontrast filled imaging of the urinary bladder unremarkable.  Stomach/Bowel: Under distended stomach with thickened walls.  Scattered colonic diverticula without extra luminal bowel inflammatory process or free air.  Secondary to regions of under distention, limited for excluding bowel mass. No obvious abnormality noted.  Vascular/Lymphatic: Atherosclerotic changes aorta and branch vessels without major vessel occlusion or aneurysm.  Normal/top-normal size lymph nodes without change.  Reproductive: Post hysterectomy.  No primary adnexal mass noted.  Other: 3 x 2.6 x 1.8 cm loculated fluid collection posterior right pelvis with small calcification along the superior margin. This is located posterior to the vaginal cuff and previously measured 2.8 x 1.2 x 1.2 cm. Etiology/ significance indeterminate.  Musculoskeletal: Scoliosis convex left. Prominent degenerative changes L2-3, L3-4 and L4-5 with facet degenerative changes L5-S1 similar to prior exam. Mild hip joint degenerative  changes.  No osseous destructive lesion.  IMPRESSION: No acute bowel inflammatory process noted. Scattered colonic diverticula. Evaluation of stomach and portions of colon limited by under distension.  3 x 2.6 x 1.8 cm loculated fluid collection posterior right pelvis with small calcification along the superior margin. This is located posterior to the vaginal cuff and previously measured 2.8 x 1.2 x 1.2 cm. Etiology/ significance indeterminate.  Aortic atherosclerosis.  Scoliosis and degenerative changes lumbar spine most notable L2-3 through L4-5.  Remainder of findings similar to prior exam.    Assessment & Plan:  pelvic fluid collection- no constitutional signs. Neg lymph nodes. I suspect that this is fluid within adhesion but, given the change in size I rec that this be followed for stability.  If significant change noted rec surgical eval but, this would not be my first plan.  Rec [place] to eval and follow rec f/u in 6 months or sooner prn  Total face-to-face time with patient was 25 min.  Greater than 50% was spent in counseling and coordination of care with the patient.   Jacarius Handel L. Harraway-Smith, M.D., Cherlynn June

## 2017-03-08 NOTE — Progress Notes (Signed)
Patient here for follow up of CT scan results ordered by her primary care physician. Kathrene Alu RNBSN

## 2017-03-09 ENCOUNTER — Telehealth: Payer: Self-pay

## 2017-03-09 NOTE — Telephone Encounter (Signed)
-----   Message from Lavonia Drafts, MD sent at 03/08/2017  5:31 PM EDT ----- Please call pt in the am. Her [place] agrees with my thoughts that this is a collection of fluid and could be fluid between adhesion in the pelvis.  There does not seem to be casue for concern. WE can repeat this in 3 months to check for stability.  Thx, clh-S

## 2017-03-09 NOTE — Telephone Encounter (Signed)
Left message for patient to return call to the office. Alasdair Kleve RNBSN 

## 2017-03-16 ENCOUNTER — Telehealth: Payer: Self-pay | Admitting: Family

## 2017-03-16 MED ORDER — INSULIN NPH ISOPHANE & REGULAR (70-30) 100 UNIT/ML ~~LOC~~ SUSP: SUBCUTANEOUS | 5 refills | Status: DC

## 2017-03-16 NOTE — Telephone Encounter (Signed)
Spoke with pt and she reports that she is now taking 45 units in the morning and evening. Refills sent.

## 2017-03-16 NOTE — Telephone Encounter (Signed)
Relation to VH:SJWT Call back [facility] [place]: [facility] ([address] [place] [phone] (Phone) [phone] (Fax)     Reason for call:  Patient requesting a refill insulin NPH-regular Human (NOVOLIN 70/30) (70-30) 100 UNIT/ML injection

## 2017-03-25 DIAGNOSIS — C50911 Malignant neoplasm of unspecified site of right female breast: Secondary | ICD-10-CM | POA: Diagnosis not present

## 2017-03-27 ENCOUNTER — Other Ambulatory Visit: Payer: Self-pay | Admitting: Family

## 2017-03-29 ENCOUNTER — Ambulatory Visit: Payer: PPO | Admitting: Family

## 2017-03-29 NOTE — Telephone Encounter (Signed)
Refill sent per [facility] refill protocol/SLS LOV: 12/29/16 ROV: 03/29/17

## 2017-04-06 ENCOUNTER — Encounter: Payer: Self-pay | Admitting: Family

## 2017-04-06 ENCOUNTER — Ambulatory Visit (INDEPENDENT_AMBULATORY_CARE_PROVIDER_SITE_OTHER): Payer: PPO | Admitting: Family

## 2017-04-06 VITALS — BP 163/52 | HR 79 | Temp 98.1°F | Resp 18 | Ht 62.0 in | Wt 214.0 lb

## 2017-04-06 DIAGNOSIS — M5416 Radiculopathy, lumbar region: Secondary | ICD-10-CM | POA: Diagnosis not present

## 2017-04-06 DIAGNOSIS — Z794 Long term (current) use of insulin: Secondary | ICD-10-CM | POA: Diagnosis not present

## 2017-04-06 DIAGNOSIS — R0602 Shortness of breath: Secondary | ICD-10-CM | POA: Diagnosis not present

## 2017-04-06 DIAGNOSIS — I10 Essential (primary) hypertension: Secondary | ICD-10-CM

## 2017-04-06 DIAGNOSIS — E1140 Type 2 diabetes mellitus with diabetic neuropathy, unspecified: Secondary | ICD-10-CM

## 2017-04-06 LAB — BASIC METABOLIC PANEL
BUN: 28 mg/dL — ABNORMAL HIGH (ref 6–23)
CO2: 30 mEq/L (ref 19–32)
Calcium: 9.6 mg/dL (ref 8.4–10.5)
Chloride: 103 mEq/L (ref 96–112)
Creatinine, Ser: 1.35 mg/dL — ABNORMAL HIGH (ref 0.40–1.20)
GFR: 49.13 mL/min — ABNORMAL LOW (ref >60.00)
Glucose, Bld: 123 mg/dL — ABNORMAL HIGH (ref 70–99)
Potassium: 4.2 mEq/L (ref 3.5–5.1)
Sodium: 141 mEq/L (ref 135–145)

## 2017-04-06 LAB — HEMOGLOBIN A1C: Hgb A1c MFr Bld: 7 % — ABNORMAL HIGH (ref 4.6–6.5)

## 2017-04-06 LAB — BRAIN NATRIURETIC PEPTIDE: Pro B Natriuretic peptide (BNP): 14 pg/mL (ref 0.0–100.0)

## 2017-04-06 MED ORDER — INSULIN NPH ISOPHANE & REGULAR (70-30) 100 UNIT/ML ~~LOC~~ SUSP: SUBCUTANEOUS | 0 refills | Status: DC

## 2017-04-06 MED ORDER — METOPROLOL SUCCINATE ER 50 MG PO TB24: 50.0000 mg | ORAL_TABLET | Freq: Every day | ORAL | 2 refills | Status: DC

## 2017-04-06 NOTE — Progress Notes (Signed)
Subjective:    Patient ID: Donna Fuller, female    DOB: 1941/09/26, 76 y.o.   MRN: [id number]  HPI  Donna Fuller is a 76 yr old female who presents today with several concerns:  HTN- she reports bilateral LE edema. She is currently maintained on amlodipine.   BP Readings from Last 3 Encounters:  04/06/17 (!) 163/52  03/08/17 (!) 144/58  12/29/16 (!) 134/48   DM2- reports fasting BS 150's,  Occasional sugars as high as 300 but generally 150 on average.  Lab Results  Component Value Date   HGBA1C 6.9 (H) 12/30/2016   HGBA1C 7.6 06/20/2014   HGBA1C 6.0 03/21/2014   Lab Results  Component Value Date   CREATININE 1.16 12/30/2016    Back pain- reports moderate/severe back pain. Not improved by tylenol. Review of her medical record shows that she saw neuro for this back in 2015. They did MRI which showed prominent degenerative changes.  With asymmetric disc osteophyte protrusion. Neuro offered ortho referral or PT. Pt chose PT. Reports that she is really frustrated with her pain.    Tried PT at that time.  Pain is located in lower back and radiates down both legs.  Especially into the right hip.  She reports that she has seen a neurosurgeon in the remote past.  She reports that 10-15 years ago she went to a pain clinic and received ESI's.    Notes some hand aching.      Review of Systems See HPI  Past Medical History:  Diagnosis Date  . Arthritis   . Breast cancer ([place]) 2007  . Diabetes mellitus without complication ([place])   . Dysuria   . Hyperlipidemia   . Hypertension   . Other malignant neoplasm without specification of site   . Spinal stenosis      Social History   Social History  . Marital status: Married    Spouse name: Donna Fuller  . Number of children: 3  . Years of education: 12   Occupational History  . Not on file.   Social History Main Topics  . Smoking status: Former Research scientist (life sciences)  . Smokeless tobacco: Never Used  . Alcohol use No  . Drug use: No  .  Sexual activity: Not Currently   Other Topics Concern  . Not on file   Social History Narrative   Marital Status:  Married Manufacturing systems engineer)    Children:  Daughter(1) Son (1)    Pets:  None    Living Situation: Lives with husband and daughter.     Occupation:  Retired Licensed conveyancer)    Education: 12th Grade    Tobacco Use/Exposure:  She used to smoke socially during the weekends but quit 40 years ago.    Alcohol Use:  Occasional   Drug Use:  None   Diet:  Regular   Exercise:  None   Hobbies:  Reading and playing volleyball                 Past Surgical History:  Procedure Laterality Date  . ABDOMINAL HYSTERECTOMY    . BREAST LUMPECTOMY    . KNEE ARTHROSCOPY    . MASTECTOMY     right side  . TUBAL LIGATION      Family History  Problem Relation Age of Onset  . Diabetes Mother   . Alzheimer's disease Mother   . Heart disease Father   . Hyperlipidemia Father   . Kidney disease Father   . Cancer Brother  lung  . Alzheimer's disease Brother   . Heart disease Brother     Allergies  Allergen Reactions  . Sulfa Antibiotics Swelling  . Novolin [Insulin Nph Isophane & Regular] Rash    Current Outpatient Prescriptions on File Prior to Visit  Medication Sig Dispense Refill  . acetaminophen (TYLENOL) 650 MG CR tablet Take 650 mg by mouth daily.    Marland Kitchen allopurinol (ZYLOPRIM) 300 MG tablet Take 1 tablet (300 mg total) by mouth daily. 30 tablet 2  . amLODipine (NORVASC) 5 MG tablet Take 0.25 mg by mouth daily.    Marland Kitchen aspirin 81 MG tablet Take 81 mg by mouth daily.    . Cholecalciferol (VITAMIN D-3) 5000 units TABS Take 1 tablet by mouth daily.    . colchicine 0.6 MG tablet Take 0.6 mg by mouth 2 (two) times daily.    . cyclobenzaprine (FLEXERIL) 10 MG tablet Take 1 tablet (10 mg total) by mouth at bedtime. 30 tablet 3  . diphenhydramine-acetaminophen (TYLENOL PM) 25-500 MG TABS tablet Take 1 tablet by mouth at bedtime as needed.    . ferrous sulfate 325 (65 FE) MG tablet Take 325 mg by  mouth daily with breakfast.    . gabapentin (NEURONTIN) 300 MG capsule Take 4 capsules (1,200 mg total) by mouth 3 (three) times daily. May take an additional tab in the evening (Patient taking differently: Take 300 mg by mouth 3 (three) times daily. May take an additional tab in the evening) 360 capsule 6  . glucose blood (ONE TOUCH ULTRA TEST) test strip Use as instructed to check blood sugar 2-3 times a day.  DX E11.9 100 each 5  . insulin NPH-regular Human (NOVOLIN 70/30) (70-30) 100 UNIT/ML injection Take 45 units every morning and 45 units every night. 30 mL 5  . lisinopril-hydrochlorothiazide (PRINZIDE,ZESTORETIC) 20-25 MG tablet Take 1 tablet by mouth every morning.    . loratadine (CLARITIN) 10 MG tablet Take 10 mg by mouth daily.    . Magnesium 250 MG TABS Take 1 tablet by mouth daily.    Marland Kitchen omeprazole (PRILOSEC) 20 MG capsule Take 1 capsule (20 mg total) by mouth daily. 30 capsule 5  . ONETOUCH DELICA LANCETS 68L MISC Use as instructed to check blood sugar 2-3 times a day.  DX E11.9 100 each 3  . pravastatin (PRAVACHOL) 20 MG tablet TAKE ONE TABLET BY MOUTH ONCE DAILY 90 tablet 0  . vitamin C (ASCORBIC ACID) 500 MG tablet Take 500 mg by mouth daily.    . potassium chloride SA (K-DUR,KLOR-CON) 20 MEQ tablet Take 1 tablet po with the Lasix     No current facility-administered medications on file prior to visit.     BP (!) 163/52 (BP Location: Right Arm, Cuff Size: Large)   Pulse 79   Temp 98.1 F (36.7 C) (Oral)   Resp 18   Ht 5\' 2"  (1.575 m)   Wt 214 lb (97.1 kg)   SpO2 100%   BMI 39.14 kg/m       Objective:   Physical Exam  Constitutional: She is oriented to person, place, and time. She appears well-developed and well-nourished.  Cardiovascular: Normal rate, regular rhythm and normal heart sounds.   No murmur heard. Pulmonary/Chest: Effort normal and breath sounds normal. No respiratory distress. She has no wheezes.  Musculoskeletal:       Cervical back: She exhibits no  tenderness.       Thoracic back: She exhibits no tenderness.  Lumbar back: She exhibits tenderness.  2+ bilateral LE edema  Neurological: She is alert and oriented to person, place, and time. She exhibits normal muscle tone.  Skin:  No significant rash noted on feet, only mild irritation bilateral dorsal feet.    Psychiatric: She has a normal mood and affect. Her behavior is normal. Judgment and thought content normal.          Assessment & Plan:  Low back pain- will repeat MRI for further evaluation. Consider neurosurgical versus pain management referral pending results.

## 2017-04-06 NOTE — Progress Notes (Signed)
Pre visit review using our clinic review tool, if applicable. No additional management support is needed unless otherwise documented below in the visit note. 

## 2017-04-06 NOTE — Patient Instructions (Addendum)
You will be contacted about your MRI and your echocardiogram. Stop amlodipine due to swelling. Start toprol xl instead for your blood pressure. Please complete lab work prior to leaving.

## 2017-04-07 ENCOUNTER — Telehealth: Payer: Self-pay | Admitting: Family

## 2017-04-07 NOTE — Telephone Encounter (Signed)
Pt called in because she have a question about yesterdays visit. She says that she was already taking Amlodipine but provider started her on another BP medication. Pt would like to be advised, should she be taking both or should she only take the newly prescribed?   Please advise further.   CB: [phone]

## 2017-04-07 NOTE — Telephone Encounter (Signed)
Per 04/06/17 office note. Pt should stop amlodipine and start Metoprolol. Left message for pt to return my call.

## 2017-04-08 NOTE — Assessment & Plan Note (Addendum)
Uncontrolled. D/c amlodipine due to swelling, begin beta blocker.

## 2017-04-08 NOTE — Assessment & Plan Note (Signed)
Lab Results  Component Value Date   HGBA1C 7.0 (H) 04/06/2017   Acceptable for her age.  Continue current dose of novolin 70/30

## 2017-04-12 ENCOUNTER — Telehealth: Payer: Self-pay | Admitting: *Deleted

## 2017-04-12 NOTE — Telephone Encounter (Signed)
Its okay with me. Please make sure she doesn't want to see a female provider (this happens often).

## 2017-04-12 NOTE — Telephone Encounter (Signed)
Ok with me 

## 2017-04-12 NOTE — Telephone Encounter (Signed)
Patient has requested to establish care here at [facility] . She requested to establish with Dr. Lacinda Axon , due to this location being close to her home. Please advise if this will be okay .

## 2017-04-13 NOTE — Telephone Encounter (Signed)
Spoke with pt. She states she has been taking amlodipine and metoprolol since last visit and her feet are still swelling. Lisinopril HCTZ is still on current med list but pt states PCP had previously stopped that medication. States metoprolol is not helping her swelling. Advised her per latest office note she should stop amlodipine as that can contribute to swelling. Pt states she has also been having headaches while on both medications. Advised pt she should continue metoprolol, check BP and if elevated call and let us know and also let us know if headache persists or worsens. Pt voices understanding. Please advise if further recommendations?

## 2017-04-13 NOTE — Telephone Encounter (Signed)
Patient is scheduled   

## 2017-04-13 NOTE — Telephone Encounter (Signed)
Called patient back and she is ok with seeing a female provider .  Do you mind calling patient to schedule.   Thanks.

## 2017-04-14 ENCOUNTER — Other Ambulatory Visit: Payer: Self-pay | Admitting: Family

## 2017-04-14 NOTE — Telephone Encounter (Signed)
Stop amlodipine, start metoprolol.  OK to remain off of lisinopril hctz for now, but I think we should repeat her BP in 1 week.  I see that she will be establishing with Dr. Lacinda Axon in 1 month.  I am happy to see her for a quick visit prior to then and we can decide if we need to add back in the lisinopril hctz.  Thanks.

## 2017-04-15 NOTE — Telephone Encounter (Signed)
Spoke with pt and scheduled follow up for 04/21/17 at 12:30pm.

## 2017-04-16 ENCOUNTER — Telehealth: Payer: Self-pay | Admitting: Family

## 2017-04-16 NOTE — Telephone Encounter (Signed)
-----   Message from Katha Hamming sent at 04/14/2017  2:35 PM EDT ----- Regarding: Donna Fuller cancelled her MRI for this Saturday, 04/17/17, due to transportation.  She may reschedule.  Thanks, Hoyle Sauer

## 2017-04-17 ENCOUNTER — Ambulatory Visit (HOSPITAL_BASED_OUTPATIENT_CLINIC_OR_DEPARTMENT_OTHER): Payer: PPO

## 2017-04-21 ENCOUNTER — Encounter: Payer: Self-pay | Admitting: Family

## 2017-04-21 ENCOUNTER — Ambulatory Visit (INDEPENDENT_AMBULATORY_CARE_PROVIDER_SITE_OTHER): Payer: PPO | Admitting: Family

## 2017-04-21 VITALS — BP 143/58 | HR 79 | Temp 98.4°F | Wt 212.8 lb

## 2017-04-21 DIAGNOSIS — Z794 Long term (current) use of insulin: Secondary | ICD-10-CM

## 2017-04-21 DIAGNOSIS — E119 Type 2 diabetes mellitus without complications: Secondary | ICD-10-CM | POA: Diagnosis not present

## 2017-04-21 DIAGNOSIS — I10 Essential (primary) hypertension: Secondary | ICD-10-CM | POA: Diagnosis not present

## 2017-04-21 MED ORDER — INSULIN NPH ISOPHANE & REGULAR (70-30) 100 UNIT/ML ~~LOC~~ SUSP: SUBCUTANEOUS | 0 refills | Status: DC

## 2017-04-21 NOTE — Progress Notes (Signed)
Subjective:    Patient ID: Donna Fuller, female    DOB: 1941/02/10, 76 y.o.   MRN: [id number]  HPI  [name] is a 76 yr old female who presents today for follow up. She reported ongoing swelling on 5/9 when she contacted [place].  She had not yet d/c'd amlodipine.  We advised her at that time to d/c amlodipine, start metoprolol and to remain off of lisinopril/hctz. She notes improvement in her swelling with this change however notes that swelling is not resolved.   BP Readings from Last 3 Encounters:  04/21/17 (!) 143/58  04/06/17 (!) 163/52  03/08/17 (!) 144/58   Sugars have been elevated.  Notes that her sugar has been as high as 350 recently.  Fasting sugars have been 150-160's.  She increased her 70/30 to 50 units bid.  This has helped some with her blood sugars.  Lab Results  Component Value Date   HGBA1C 7.0 (H) 04/06/2017   HGBA1C 6.9 (H) 12/30/2016   HGBA1C 7.6 06/20/2014   Lab Results  Component Value Date   CREATININE 1.35 (H) 04/06/2017      Review of Systems See HPI  Past Medical History:  Diagnosis Date  . Arthritis   . Breast cancer ([place]) 2007  . Diabetes mellitus without complication ([place])   . Dysuria   . Hyperlipidemia   . Hypertension   . Other malignant neoplasm without specification of site   . Spinal stenosis      Social History   Social History  . Marital status: Married    Spouse name: [name]  . Number of children: 3  . Years of education: 12   Occupational History  . Not on file.   Social History Main Topics  . Smoking status: Former Research scientist (life sciences)  . Smokeless tobacco: Never Used  . Alcohol use No  . Drug use: No  . Sexual activity: Not Currently   Other Topics Concern  . Not on file   Social History Narrative   Marital Status:  Married Manufacturing systems engineer)    Children:  Daughter(1) Son (1)    Pets:  None    Living Situation: Lives with husband and daughter.     Occupation:  Retired Licensed conveyancer)    Education: 12th Grade    Tobacco Use/Exposure:  She  used to smoke socially during the weekends but quit 40 years ago.    Alcohol Use:  Occasional   Drug Use:  None   Diet:  Regular   Exercise:  None   Hobbies:  Reading and playing volleyball                 Past Surgical History:  Procedure Laterality Date  . ABDOMINAL HYSTERECTOMY    . BREAST LUMPECTOMY    . KNEE ARTHROSCOPY    . MASTECTOMY     right side  . TUBAL LIGATION      Family History  Problem Relation Age of Onset  . Diabetes Mother   . Alzheimer's disease Mother   . Heart disease Father   . Hyperlipidemia Father   . Kidney disease Father   . Cancer Brother        lung  . Alzheimer's disease Brother   . Heart disease Brother     Allergies  Allergen Reactions  . Sulfa Antibiotics Swelling  . Novolin [Insulin Nph Isophane & Regular] Rash    Current Outpatient Prescriptions on File Prior to Visit  Medication Sig Dispense Refill  . acetaminophen (TYLENOL) 650  MG CR tablet Take 650 mg by mouth daily.    Marland Kitchen allopurinol (ZYLOPRIM) 300 MG tablet Take 1 tablet (300 mg total) by mouth daily. 30 tablet 2  . aspirin 81 MG tablet Take 81 mg by mouth daily.    . Cholecalciferol (VITAMIN D-3) 5000 units TABS Take 1 tablet by mouth daily.    . colchicine 0.6 MG tablet Take 0.6 mg by mouth 2 (two) times daily.    . cyanocobalamin 1000 MCG tablet Take 1,000 mcg by mouth daily.    . cyclobenzaprine (FLEXERIL) 10 MG tablet Take 1 tablet (10 mg total) by mouth at bedtime. 30 tablet 3  . diphenhydramine-acetaminophen (TYLENOL PM) 25-500 MG TABS tablet Take 1 tablet by mouth at bedtime as needed.    . ferrous sulfate 325 (65 FE) MG tablet Take 325 mg by mouth daily with breakfast.    . gabapentin (NEURONTIN) 300 MG capsule Take 4 capsules (1,200 mg total) by mouth 3 (three) times daily. May take an additional tab in the evening (Patient taking differently: Take 300 mg by mouth 3 (three) times daily. May take an additional tab in the evening) 360 capsule 6  . glucose blood (ONE  TOUCH ULTRA TEST) test strip Use as instructed to check blood sugar 2-3 times a day.  DX E11.9 100 each 5  . insulin NPH-regular Human (NOVOLIN 70/30) (70-30) 100 UNIT/ML injection Take 45 units every morning and 45 units every night. 3 mL 0  . loratadine (CLARITIN) 10 MG tablet Take 10 mg by mouth daily.    . Magnesium 250 MG TABS Take 1 tablet by mouth daily.    . metoprolol succinate (TOPROL-XL) 50 MG 24 hr tablet Take 1 tablet (50 mg total) by mouth daily. Take with or immediately following a meal. 30 tablet 2  . omeprazole (PRILOSEC) 20 MG capsule Take 1 capsule (20 mg total) by mouth daily. 30 capsule 5  . ONETOUCH DELICA LANCETS 68T MISC Use as instructed to check blood sugar 2-3 times a day.  DX E11.9 100 each 3  . pravastatin (PRAVACHOL) 20 MG tablet TAKE ONE TABLET BY MOUTH ONCE DAILY 90 tablet 0  . vitamin C (ASCORBIC ACID) 500 MG tablet Take 500 mg by mouth daily.     No current facility-administered medications on file prior to visit.     BP (!) 143/58 (BP Location: Right Arm, Patient Position: Sitting, Cuff Size: Large)   Pulse 79   Temp 98.4 F (36.9 C) (Oral)   Wt 212 lb 12.8 oz (96.5 kg)   SpO2 99% Comment: RA  BMI 38.92 kg/m       Objective:   Physical Exam  Constitutional: She is oriented to person, place, and time. She appears well-developed and well-nourished.  Cardiovascular: Normal rate, regular rhythm and normal heart sounds.   No murmur heard. Pulmonary/Chest: Effort normal and breath sounds normal. No respiratory distress. She has no wheezes.  Musculoskeletal:  1+ bilateral LE edema.   Neurological: She is alert and oriented to person, place, and time.  Psychiatric: She has a normal mood and affect. Her behavior is normal. Judgment and thought content normal.          Assessment & Plan:  HTN- BP looks better, swelling is improved. She is not currently on an ACE and she is diabetic. Will check urine microalbumin.  May need to add in low dose ace  inhibitor.  DM2- last A1C was 7 however per her reports more recently sugars have been  more elevated. She was unable to afford janumet which had previously helped her sugar.  Cr is mildly elevated so would not add back in metformin.  States she is "already in the donut hole" with her 70/30.  Will increase her 70/30 from 50 units bid to 52 units bid.   Lab Results  Component Value Date   CREATININE 1.35 (H) 04/06/2017

## 2017-04-21 NOTE — Patient Instructions (Addendum)
Increase 70/30 to 52 units twice daily. Bring your glucose log to you follow up appointment.   Please go to lab work prior to leaving.

## 2017-04-21 NOTE — Progress Notes (Signed)
Pre visit review using our clinic review tool, if applicable. No additional management support is needed unless otherwise documented below in the visit note. 

## 2017-04-22 LAB — MICROALBUMIN / CREATININE URINE RATIO
Creatinine, Urine: 41 mg/dL (ref 20–320)
Microalb Creat Ratio: 10 mcg/mg creat (ref <30)
Microalb, Ur: 0.4 mg/dL

## 2017-04-24 ENCOUNTER — Ambulatory Visit (HOSPITAL_BASED_OUTPATIENT_CLINIC_OR_DEPARTMENT_OTHER)
Admission: RE | Admit: 2017-04-24 | Discharge: 2017-04-24 | Disposition: A | Discharge status: Home / Self Care | Payer: PPO | Source: Ambulatory Visit | Attending: Family | Admitting: Family

## 2017-04-24 DIAGNOSIS — M4726 Other spondylosis with radiculopathy, lumbar region: Secondary | ICD-10-CM | POA: Insufficient documentation

## 2017-04-24 DIAGNOSIS — M5416 Radiculopathy, lumbar region: Secondary | ICD-10-CM

## 2017-04-24 DIAGNOSIS — M545 Low back pain: Secondary | ICD-10-CM | POA: Diagnosis not present

## 2017-04-30 ENCOUNTER — Other Ambulatory Visit: Payer: Self-pay | Admitting: Family

## 2017-05-06 DIAGNOSIS — H401131 Primary open-angle glaucoma, bilateral, mild stage: Secondary | ICD-10-CM | POA: Diagnosis not present

## 2017-05-06 DIAGNOSIS — H25013 Cortical age-related cataract, bilateral: Secondary | ICD-10-CM | POA: Diagnosis not present

## 2017-05-06 DIAGNOSIS — H2513 Age-related nuclear cataract, bilateral: Secondary | ICD-10-CM | POA: Diagnosis not present

## 2017-05-11 ENCOUNTER — Emergency Department (HOSPITAL_BASED_OUTPATIENT_CLINIC_OR_DEPARTMENT_OTHER)
Admission: EM | Admit: 2017-05-11 | Discharge: 2017-05-11 | Disposition: A | Discharge status: Home / Self Care | Payer: PPO | Attending: Emergency Medicine | Admitting: Emergency Medicine

## 2017-05-11 ENCOUNTER — Encounter (HOSPITAL_BASED_OUTPATIENT_CLINIC_OR_DEPARTMENT_OTHER): Payer: Self-pay

## 2017-05-11 ENCOUNTER — Telehealth: Payer: Self-pay | Admitting: Family

## 2017-05-11 ENCOUNTER — Emergency Department (HOSPITAL_BASED_OUTPATIENT_CLINIC_OR_DEPARTMENT_OTHER): Payer: PPO

## 2017-05-11 DIAGNOSIS — Z7982 Long term (current) use of aspirin: Secondary | ICD-10-CM | POA: Diagnosis not present

## 2017-05-11 DIAGNOSIS — Z853 Personal history of malignant neoplasm of breast: Secondary | ICD-10-CM | POA: Diagnosis not present

## 2017-05-11 DIAGNOSIS — R0602 Shortness of breath: Secondary | ICD-10-CM | POA: Diagnosis not present

## 2017-05-11 DIAGNOSIS — I10 Essential (primary) hypertension: Secondary | ICD-10-CM | POA: Insufficient documentation

## 2017-05-11 DIAGNOSIS — Z7902 Long term (current) use of antithrombotics/antiplatelets: Secondary | ICD-10-CM | POA: Diagnosis not present

## 2017-05-11 DIAGNOSIS — Z794 Long term (current) use of insulin: Secondary | ICD-10-CM | POA: Diagnosis not present

## 2017-05-11 DIAGNOSIS — Z87891 Personal history of nicotine dependence: Secondary | ICD-10-CM | POA: Diagnosis not present

## 2017-05-11 DIAGNOSIS — E1140 Type 2 diabetes mellitus with diabetic neuropathy, unspecified: Secondary | ICD-10-CM | POA: Insufficient documentation

## 2017-05-11 DIAGNOSIS — R079 Chest pain, unspecified: Secondary | ICD-10-CM | POA: Insufficient documentation

## 2017-05-11 LAB — CBC WITH DIFFERENTIAL/PLATELET
Basophils Absolute: 0 10*3/uL (ref 0.0–0.1)
Basophils Relative: 0 %
Eosinophils Absolute: 0.1 10*3/uL (ref 0.0–0.7)
Eosinophils Relative: 1 %
HCT: 40 % (ref 36.0–46.0)
Hemoglobin: 13.1 g/dL (ref 12.0–15.0)
Lymphocytes Relative: 36 %
Lymphs Abs: 2.9 10*3/uL (ref 0.7–4.0)
MCH: 29.8 pg (ref 26.0–34.0)
MCHC: 32.8 g/dL (ref 30.0–36.0)
MCV: 91.1 fL (ref 78.0–100.0)
Monocytes Absolute: 0.6 10*3/uL (ref 0.1–1.0)
Monocytes Relative: 7 %
Neutro Abs: 4.4 10*3/uL (ref 1.7–7.7)
Neutrophils Relative %: 56 %
Platelets: 177 10*3/uL (ref 150–400)
RBC: 4.39 MIL/uL (ref 3.87–5.11)
RDW: 15.6 % — ABNORMAL HIGH (ref 11.5–15.5)
WBC: 8.1 10*3/uL (ref 4.0–10.5)

## 2017-05-11 LAB — BASIC METABOLIC PANEL
Anion gap: 7 (ref 5–15)
BUN: 22 mg/dL — ABNORMAL HIGH (ref 6–20)
CO2: 30 mmol/L (ref 22–32)
Calcium: 9.4 mg/dL (ref 8.9–10.3)
Chloride: 104 mmol/L (ref 101–111)
Creatinine, Ser: 1.19 mg/dL — ABNORMAL HIGH (ref 0.44–1.00)
GFR calc Af Amer: 50 mL/min — ABNORMAL LOW (ref >60)
GFR calc non Af Amer: 44 mL/min — ABNORMAL LOW (ref >60)
Glucose, Bld: 125 mg/dL — ABNORMAL HIGH (ref 65–99)
Potassium: 3.8 mmol/L (ref 3.5–5.1)
Sodium: 141 mmol/L (ref 135–145)

## 2017-05-11 LAB — CBG MONITORING, ED: Glucose-Capillary: 132 mg/dL — ABNORMAL HIGH (ref 65–99)

## 2017-05-11 LAB — URINALYSIS, ROUTINE W REFLEX MICROSCOPIC
Bilirubin Urine: NEGATIVE
Glucose, UA: NEGATIVE mg/dL
Hgb urine dipstick: NEGATIVE
Ketones, ur: NEGATIVE mg/dL
Leukocytes, UA: NEGATIVE
Nitrite: NEGATIVE
Protein, ur: NEGATIVE mg/dL
Specific Gravity, Urine: 1.004 — ABNORMAL LOW (ref 1.005–1.030)
pH: 7 (ref 5.0–8.0)

## 2017-05-11 LAB — TROPONIN I
Troponin I: <0.03 ng/mL (ref <0.03)
Troponin I: <0.03 ng/mL (ref <0.03)

## 2017-05-11 LAB — BRAIN NATRIURETIC PEPTIDE: B Natriuretic Peptide: 37.5 pg/mL (ref 0.0–100.0)

## 2017-05-11 MED ORDER — FAMOTIDINE 20 MG PO TABS: 20.0000 mg | ORAL_TABLET | Freq: Two times a day (BID) | ORAL | 0 refills | Status: DC

## 2017-05-11 MED ORDER — NITROGLYCERIN 0.4 MG SL SUBL: 0.4000 mg | SUBLINGUAL_TABLET | SUBLINGUAL | Status: DC | PRN

## 2017-05-11 MED ORDER — GI COCKTAIL ~~LOC~~
30.0000 mL | Freq: Once | ORAL | Status: AC
  Administered 2017-05-11: 30 mL via ORAL
  Filled 2017-05-11: qty 30

## 2017-05-11 MED FILL — FAMOTIDINE 20 MG TABLET: 20 | 15 days supply | Qty: 30 | Fill #0

## 2017-05-11 NOTE — ED Triage Notes (Addendum)
C/o CP x 3 days-NAD-steady gait-pt reports she has appt for echo 6/19 after PCP 5/23 visit where she had c/o "chest sore"

## 2017-05-11 NOTE — ED Provider Notes (Signed)
[facility] DEPT [facility] Provider Note   CSN: [id number] Arrival date & time: 05/11/17  1147     History   Chief Complaint Chief Complaint  Patient presents with  . Chest Pain    HPI Donna Fuller is a 76 y.o. female.  Pt presents to the ED today with CP.  She said that she has had the pain for about 3 days.  It's been intermittent.  She does have some swelling in her legs and some sob.  She did see her pcp on 5/23 who changed her bp meds from amlodipine to metoprolol.  Pt said her pcp ordered an ECHO for June 19.  The pt did take her daily asa last night.       Past Medical History:  Diagnosis Date  . Arthritis   . Breast cancer ([place]) 2007  . Diabetes mellitus without complication ([place])   . Dysuria   . Hyperlipidemia   . Hypertension   . Other malignant neoplasm without specification of site   . Spinal stenosis     Patient Active Problem List   Diagnosis Date Noted  . Hypertriglyceridemia 09/28/2016  . Hyperlipidemia 09/26/2016  . Spinal stenosis 09/26/2016  . History of breast cancer 09/26/2016  . Diabetes mellitus type 2, controlled ([place]) 09/26/2016  . History of carpal tunnel syndrome 09/26/2016  . Anemia, iron deficiency 09/26/2016  . GERD (gastroesophageal reflux disease) 09/26/2016  . Radiculopathy of lumbar region 03/13/2015  . Neuropathy 03/13/2015  . Hypertension   . Lumbar radiculopathy 01/08/2014   Rheumatic fever as a 76 year old.  No known valve complications, but no echo. Past Surgical History:  Procedure Laterality Date  . ABDOMINAL HYSTERECTOMY    . BREAST LUMPECTOMY    . KNEE ARTHROSCOPY    . MASTECTOMY     right side  . TUBAL LIGATION      OB History    No data available       Home Medications    Prior to Admission medications   Medication Sig Start Date End Date Taking? Authorizing Provider  acetaminophen (TYLENOL) 650 MG CR tablet Take 650 mg by mouth daily.    [provider]  allopurinol (ZYLOPRIM) 300 MG  tablet TAKE 1 TABLET BY MOUTH ONCE DAILY 05/03/17   Debbrah Alar, NP  aspirin 81 MG tablet Take 81 mg by mouth daily.    [provider]  Cholecalciferol (VITAMIN D-3) 5000 units TABS Take 1 tablet by mouth daily.    [provider]  cyanocobalamin 1000 MCG tablet Take 1,000 mcg by mouth daily.    [provider]  cyclobenzaprine (FLEXERIL) 10 MG tablet Take 1 tablet (10 mg total) by mouth at bedtime. 02/09/14   Zanard, Bernadene Bell, MD  diphenhydramine-acetaminophen (TYLENOL PM) 25-500 MG TABS tablet Take 1 tablet by mouth at bedtime as needed.    [provider]  famotidine (PEPCID) 20 MG tablet Take 1 tablet (20 mg total) by mouth 2 (two) times daily. 05/11/17   Isla Pence, MD  ferrous sulfate 325 (65 FE) MG tablet Take 325 mg by mouth daily with breakfast.    [provider]  gabapentin (NEURONTIN) 300 MG capsule Take 4 capsules (1,200 mg total) by mouth 3 (three) times daily. May take an additional tab in the evening Patient taking differently: Take 300 mg by mouth 3 (three) times daily. May take an additional tab in the evening 12/31/14   Melvenia Beam, MD  glucose blood (ONE TOUCH ULTRA TEST)  test strip Use as instructed to check blood sugar 2-3 times a day.  DX E11.9 09/29/16   Debbrah Alar, NP  insulin NPH-regular Human (NOVOLIN 70/30) (70-30) 100 UNIT/ML injection Take 52 units every morning and 52 units every night. 04/21/17   Debbrah Alar, NP  loratadine (CLARITIN) 10 MG tablet Take 10 mg by mouth daily.    [provider]  Magnesium 250 MG TABS Take 1 tablet by mouth daily.    [provider]  metoprolol succinate (TOPROL-XL) 50 MG 24 hr tablet Take 1 tablet (50 mg total) by mouth daily. Take with or immediately following a meal. 04/06/17   Debbrah Alar, NP  omeprazole (PRILOSEC) 20 MG capsule Take 1 capsule (20 mg total) by mouth daily. 12/29/16   Debbrah Alar, NP  [facility] DELICA LANCETS 25Z MISC  Use as instructed to check blood sugar 2-3 times a day.  DX E11.9 09/30/16   Debbrah Alar, NP  pravastatin (PRAVACHOL) 20 MG tablet TAKE ONE TABLET BY MOUTH ONCE DAILY 03/29/17   Debbrah Alar, NP  vitamin C (ASCORBIC ACID) 500 MG tablet Take 500 mg by mouth daily.    [provider]    Family History Family History  Problem Relation Age of Onset  . Diabetes Mother   . Alzheimer's disease Mother   . Heart disease Father   . Hyperlipidemia Father   . Kidney disease Father   . Cancer Brother        lung  . Alzheimer's disease Brother   . Heart disease Brother     Social History Social History  Substance Use Topics  . Smoking status: Former Research scientist (life sciences)  . Smokeless tobacco: Never Used  . Alcohol use No     Allergies   Sulfa antibiotics   Review of Systems Review of Systems  Respiratory: Positive for shortness of breath.   Cardiovascular: Positive for chest pain and leg swelling.  All other systems reviewed and are negative.    Physical Exam Updated Vital Signs BP (!) 154/77   Pulse 64   Temp 97.8 F (36.6 C) (Oral)   Resp 18   Ht 5\' 2"  (1.575 m)   Wt 97.5 kg (215 lb)   SpO2 99%   BMI 39.32 kg/m   Physical Exam  Constitutional: She is oriented to person, place, and time. She appears well-developed and well-nourished.  HENT:  Head: Normocephalic and atraumatic.  Right Ear: External ear normal.  Left Ear: External ear normal.  Nose: Nose normal.  Mouth/Throat: Oropharynx is clear and moist.  Eyes: Conjunctivae and EOM are normal. Pupils are equal, round, and reactive to light.  Neck: Normal range of motion. Neck supple.  Cardiovascular: Normal rate, regular rhythm, normal heart sounds and intact distal pulses.   Pulmonary/Chest: Effort normal and breath sounds normal.  Abdominal: Soft. Bowel sounds are normal.  Musculoskeletal: She exhibits edema.  Neurological: She is alert and oriented to person, place, and time.  Skin: Skin is warm and  dry.  Psychiatric: She has a normal mood and affect. Her behavior is normal. Judgment and thought content normal.  Nursing note and vitals reviewed.    ED Treatments / Results  Labs (all labs ordered are listed, but only abnormal results are displayed) Labs Reviewed  BASIC METABOLIC PANEL - Abnormal; Notable for the following:       Result Value   Glucose, Bld 125 (*)    BUN 22 (*)    Creatinine, Ser 1.19 (*)    GFR calc  non Af Amer 44 (*)    GFR calc Af Amer 50 (*)    All other components within normal limits  CBC WITH DIFFERENTIAL/PLATELET - Abnormal; Notable for the following:    RDW 15.6 (*)    All other components within normal limits  URINALYSIS, ROUTINE W REFLEX MICROSCOPIC - Abnormal; Notable for the following:    Specific Gravity, Urine 1.004 (*)    All other components within normal limits  CBG MONITORING, ED - Abnormal; Notable for the following:    Glucose-Capillary 132 (*)    All other components within normal limits  TROPONIN I  TROPONIN I  BRAIN NATRIURETIC PEPTIDE    EKG  EKG Interpretation  Date/Time:  Tuesday May 11 2017 11:55:15 EDT Ventricular Rate:  78 PR Interval:  186 QRS Duration: 80 QT Interval:  422 QTC Calculation: 481 R Axis:   75 Text Interpretation:  Normal sinus rhythm Possible Left atrial enlargement Borderline ECG Confirmed by Isla Pence [phone]) on 05/11/2017 12:00:21 PM       Radiology Dg Chest 2 View  Result Date: 05/11/2017 CLINICAL DATA:  Three day history of chest pain. EXAM: CHEST  2 VIEW COMPARISON:  02/14/2016 FINDINGS: The cardiac silhouette, mediastinal and hilar contours are within normal limits and stable. Mild tortuosity calcification of the thoracic aorta. The lungs are clear of acute process. No infiltrates or effusions. No worrisome pulmonary lesions. The bony thorax is intact. IMPRESSION: No acute cardiopulmonary findings. Electronically Signed   By: Marijo Sanes M.D.   On: 05/11/2017 12:40     Procedures Procedures (including critical care time)  Medications Ordered in ED Medications  nitroGLYCERIN (NITROSTAT) SL tablet 0.4 mg (not administered)  gi cocktail (Maalox,Lidocaine,Donnatal) (30 mLs Oral Given 05/11/17 1333)     Initial Impression / Assessment and Plan / ED Course  I have reviewed the triage vital signs and the nursing notes.  Pertinent labs & imaging results that were available during my care of the patient were reviewed by me and considered in my medical decision making (see chart for details).   Nitro did not help the chest pain.  The pt's pain did improve after GI cocktail.  2 neg troponins.  Sx have been going on for weeks.  She requested cardiology number close to where she lives (in [place]), so she was given the number to [facility] cardiology.  She knows to return if worse and to f/u with her ECHO.   Final Clinical Impressions(s) / ED Diagnoses   Final diagnoses:  Chest pain, unspecified type    New Prescriptions New Prescriptions   FAMOTIDINE (PEPCID) 20 MG TABLET    Take 1 tablet (20 mg total) by mouth 2 (two) times daily.     Isla Pence, MD 05/11/17 [phone]

## 2017-05-11 NOTE — Telephone Encounter (Signed)
Patient Name: ORTHA METTS DOB: December 01, 1940 Initial Comment Caller states that she is having tightness in her chest and swelling in both of her feet. She states that swelling in mainly in the left foot. She just recently changed blood pressure medications. Nurse Assessment Nurse: Ronnald Ramp, RN, Miranda Date/Time (Eastern Time): 05/11/2017 9:47:36 AM Confirm and document reason for call. If symptomatic, describe symptoms. ---Caller states she is having tightness in her chest since Sunday. She had worse chest pain yesterday. Does the patient have any new or worsening symptoms? ---Yes Will a triage be completed? ---Yes Related visit to physician within the last 2 weeks? ---Yes Does the PT have any chronic conditions? (i.e. diabetes, asthma, etc.) ---Yes List chronic conditions. ---Diabetes, HTN, High Cholesterol, Gout, Neuropathy. Is this a behavioral health or substance abuse call? ---No Guidelines Guideline Title Affirmed Question Affirmed Notes Breathing Difficulty [1] MODERATE difficulty breathing (e.g., speaks in phrases, SOB even at rest, pulse 100-120) AND [2] NEW-onset or WORSE than normal Final Disposition User Go to ED Now Ronnald Ramp, RN, Miranda Referrals [facility] - ED Disagree/Comply: Comply

## 2017-05-11 NOTE — ED Notes (Signed)
Pt transported to XR.  

## 2017-05-18 ENCOUNTER — Other Ambulatory Visit: Payer: PPO

## 2017-05-20 ENCOUNTER — Ambulatory Visit (INDEPENDENT_AMBULATORY_CARE_PROVIDER_SITE_OTHER): Payer: PPO | Admitting: Family Medicine

## 2017-05-20 ENCOUNTER — Encounter: Payer: Self-pay | Admitting: Family Medicine

## 2017-05-20 DIAGNOSIS — I10 Essential (primary) hypertension: Secondary | ICD-10-CM

## 2017-05-20 DIAGNOSIS — G8929 Other chronic pain: Secondary | ICD-10-CM | POA: Diagnosis not present

## 2017-05-20 DIAGNOSIS — Z794 Long term (current) use of insulin: Secondary | ICD-10-CM | POA: Diagnosis not present

## 2017-05-20 DIAGNOSIS — M109 Gout, unspecified: Secondary | ICD-10-CM | POA: Insufficient documentation

## 2017-05-20 DIAGNOSIS — N1832 Chronic kidney disease, stage 3b: Secondary | ICD-10-CM | POA: Insufficient documentation

## 2017-05-20 DIAGNOSIS — N183 Chronic kidney disease, stage 3 unspecified: Secondary | ICD-10-CM | POA: Insufficient documentation

## 2017-05-20 DIAGNOSIS — E119 Type 2 diabetes mellitus without complications: Secondary | ICD-10-CM | POA: Diagnosis not present

## 2017-05-20 DIAGNOSIS — M545 Low back pain: Secondary | ICD-10-CM | POA: Diagnosis not present

## 2017-05-20 MED ORDER — LOSARTAN POTASSIUM-HCTZ 50-12.5 MG PO TABS: 1.0000 | ORAL_TABLET | Freq: Every day | ORAL | 3 refills | Status: DC

## 2017-05-20 MED ORDER — OMEPRAZOLE 40 MG PO CPDR: 40.0000 mg | DELAYED_RELEASE_CAPSULE | Freq: Every day | ORAL | 1 refills | Status: DC

## 2017-05-20 NOTE — Patient Instructions (Signed)
Labs in 7-10 days.  Medications as prescribed.  Follow up in 3 months.  Take care  Dr. Lacinda Axon

## 2017-05-20 NOTE — Progress Notes (Signed)
Pre-visit discussion using our clinic review tool. No additional management support is needed unless otherwise documented below in the visit note.  

## 2017-05-21 NOTE — Assessment & Plan Note (Signed)
MRI reviewed. Discussed treatment options - Tylenol, NSAID's, Tramadol. Suggested tramadol and patient refused. I informed her that stronger pain medications are not in her best interest. Discussed referral to pain management. Patient declined. Discussed possibility of surgery and patient also declined.

## 2017-05-21 NOTE — Assessment & Plan Note (Signed)
Currently stable on 70/30. Continue.

## 2017-05-21 NOTE — Progress Notes (Signed)
Subjective:  Patient ID: Donna Fuller, female    DOB: May 15, 1941  Age: 76 y.o. MRN: [id number]  CC: Establish care  HPI:  76 year old female with chronic back pain, hypertension, DM 2, CKD, gout, history of breast cancer, hyperlipidemia presents to establish care with me. Issues and concerns are below.  Chronic low back pain  Patient has long-standing history of back pain.  She has previously been seen by neurology. She has underwent physical therapy as well as epidural steroid injections.  She has seen pain management in the past.  She reports that she has chronic severe low back pain.  She takes Tylenol without significant improvement.  She is very frustrated.  She recently underwent repeat MRI which revealed diffuse spondylosis.  Patient wants to discuss options regarding her pain today.  Hypertension  Uncontrolled. Not at goal.  She is currently only on metoprolol.  She has been on amlodipine, HCTZ, lisinopril previously.  Amlodipine was discontinued secondary to lower extremity edema. Lisinopril discontinued due to cough.  Patient wants additional medication for her blood pressure.  DM 2  Most recent A1c at goal of 7 or less.  She is currently on 52 units of 70/30.  Social Hx   Social History   Social History  . Marital status: Married    Spouse name: [name]  . Number of children: 3  . Years of education: 33   Social History Main Topics  . Smoking status: Former Research scientist (life sciences)  . Smokeless tobacco: Never Used  . Alcohol use No  . Drug use: No  . Sexual activity: Not Asked   Other Topics Concern  . None   Social History Narrative   Marital Status:  Married Manufacturing systems engineer)    Children:  Daughter(1) Son (1)    Pets:  None    Living Situation: Lives with husband and daughter.     Occupation:  Retired Licensed conveyancer)    Education: 12th Grade    Tobacco Use/Exposure:  She used to smoke socially during the weekends but quit 40 years ago.    Alcohol Use:  Occasional   Drug Use:  None   Diet:  Regular   Exercise:  None   Hobbies:  Reading and playing volleyball                 Review of Systems  Cardiovascular: Positive for leg swelling. Negative for chest pain.       Elevated BP.  Musculoskeletal: Positive for back pain.       Pain, R shin.   Objective:  BP (!) 150/88 (BP Location: Left Arm, Patient Position: Sitting, Cuff Size: Large)   Pulse 82   Temp 98.7 F (37.1 C) (Oral)   Resp 12   Ht 5\' 2"  (1.575 m)   Wt 213 lb (96.6 kg)   SpO2 94%   BMI 38.96 kg/m   BP/Weight 05/20/2017 05/11/2017 6/38/7564  Systolic BP 332 951 884  Diastolic BP 88 81 58  Wt. (Lbs) 213 215 212.8  BMI 38.96 39.32 38.92    Physical Exam  Constitutional: She is oriented to person, place, and time. She appears well-developed. No distress.  Cardiovascular: Normal rate and regular rhythm.   Pulmonary/Chest: Effort normal and breath sounds normal. She has no wheezes. She has no rales.  Neurological: She is alert and oriented to person, place, and time.  Psychiatric: She has a normal mood and affect.  Vitals reviewed.  Lab Results  Component Value Date   WBC 8.1 05/11/2017  HGB 13.1 05/11/2017   HCT 40.0 05/11/2017   PLT 177 05/11/2017   GLUCOSE 125 (H) 05/11/2017   CHOL 166 12/30/2016   TRIG 202.0 (H) 12/30/2016   HDL 46.20 12/30/2016   LDLDIRECT 76.0 12/30/2016   NA 141 05/11/2017   K 3.8 05/11/2017   CL 104 05/11/2017   CREATININE 1.19 (H) 05/11/2017   BUN 22 (H) 05/11/2017   CO2 30 05/11/2017   TSH 1.350 12/12/2014   HGBA1C 7.0 (H) 04/06/2017   MICROALBUR 0.4 04/21/2017    Assessment & Plan:   Problem List Items Addressed This Visit      Cardiovascular and Mediastinum   Hypertension    Uncontrolled. Adding losartan HCTZ. Metabolic panel in 2-02 days.      Relevant Medications   losartan-hydrochlorothiazide (HYZAAR) 50-12.5 MG tablet     Endocrine   Diabetes mellitus type 2, controlled ([facility])    Currently stable on 70/30. Continue.       Relevant Medications   losartan-hydrochlorothiazide (HYZAAR) 50-12.5 MG tablet     Other   Chronic low back pain    MRI reviewed. Discussed treatment options - Tylenol, NSAID's, Tramadol. Suggested tramadol and patient refused. I informed her that stronger pain medications are not in her best interest. Discussed referral to pain management. Patient declined. Discussed possibility of surgery and patient also declined.         Meds ordered this encounter  Medications  . omeprazole (PRILOSEC) 40 MG capsule    Sig: Take 1 capsule (40 mg total) by mouth daily.    Dispense:  90 capsule    Refill:  1  . losartan-hydrochlorothiazide (HYZAAR) 50-12.5 MG tablet    Sig: Take 1 tablet by mouth daily.    Dispense:  90 tablet    Refill:  3    Follow-up: 3 months  Ryder DO [facility]

## 2017-05-21 NOTE — Assessment & Plan Note (Signed)
Uncontrolled. Adding losartan HCTZ. Metabolic panel in 2-33 days.

## 2017-05-24 ENCOUNTER — Telehealth: Payer: Self-pay | Admitting: *Deleted

## 2017-05-24 ENCOUNTER — Other Ambulatory Visit: Payer: Self-pay | Admitting: Family Medicine

## 2017-05-24 MED ORDER — GABAPENTIN 300 MG PO CAPS
300.0000 mg | ORAL_CAPSULE | Freq: Three times a day (TID) | ORAL | 0 refills | Status: DC
Start: 2017-05-24 — End: 2017-06-14

## 2017-05-24 NOTE — Telephone Encounter (Signed)
Spoke with the patient, she currently takes 1 capsule by mouth three times a day, and occasionally an additional at bedtime.  Capsule is 300mg  each.   Medication sent to the pharmacy, thanks

## 2017-05-24 NOTE — Addendum Note (Signed)
Addended by: Bevelyn Ngo on: 05/24/2017 04:03 PM   Modules accepted: Orders

## 2017-05-24 NOTE — Telephone Encounter (Signed)
Medication Refill requested for : gabapentin  Pharmacy: [address] [facility] Return Contact : [phone]

## 2017-05-24 NOTE — Telephone Encounter (Signed)
Please advise for refill this is listed as a historical thanks

## 2017-05-24 NOTE — Telephone Encounter (Signed)
Please clarify dosing before refill.

## 2017-05-27 ENCOUNTER — Telehealth: Payer: Self-pay | Admitting: Radiology

## 2017-05-27 NOTE — Telephone Encounter (Signed)
Pt coming in for labs tomorrow, please place future orders. Thank you.  

## 2017-05-28 ENCOUNTER — Other Ambulatory Visit (INDEPENDENT_AMBULATORY_CARE_PROVIDER_SITE_OTHER): Payer: PPO

## 2017-05-28 ENCOUNTER — Other Ambulatory Visit: Payer: Self-pay | Admitting: Family Medicine

## 2017-05-28 DIAGNOSIS — I10 Essential (primary) hypertension: Secondary | ICD-10-CM

## 2017-05-28 LAB — BASIC METABOLIC PANEL
BUN: 24 mg/dL — ABNORMAL HIGH (ref 6–23)
CO2: 30 mEq/L (ref 19–32)
Calcium: 9.4 mg/dL (ref 8.4–10.5)
Chloride: 103 mEq/L (ref 96–112)
Creatinine, Ser: 1.35 mg/dL — ABNORMAL HIGH (ref 0.40–1.20)
GFR: 49.11 mL/min — ABNORMAL LOW (ref >60.00)
Glucose, Bld: 206 mg/dL — ABNORMAL HIGH (ref 70–99)
Potassium: 4.3 mEq/L (ref 3.5–5.1)
Sodium: 140 mEq/L (ref 135–145)

## 2017-06-14 ENCOUNTER — Other Ambulatory Visit: Payer: Self-pay | Admitting: Family Medicine

## 2017-06-15 NOTE — Telephone Encounter (Signed)
Last filled 05/24/17 previously by Dr Corinna Lines  Not on current med list Last office visit 05/20/17 Next office visit 08/19/17

## 2017-06-18 ENCOUNTER — Other Ambulatory Visit: Payer: Self-pay | Admitting: Family

## 2017-06-28 ENCOUNTER — Other Ambulatory Visit: Payer: Self-pay

## 2017-06-28 MED ORDER — PRAVASTATIN SODIUM 20 MG PO TABS: 20.0000 mg | ORAL_TABLET | Freq: Every day | ORAL | 0 refills | Status: DC

## 2017-06-28 NOTE — Telephone Encounter (Signed)
Previously prescribed by Debbrah Alar #90 Last filled  04/28/17 Last office visit 05/20/17 Next office 08/19/17

## 2017-07-08 DIAGNOSIS — H401132 Primary open-angle glaucoma, bilateral, moderate stage: Secondary | ICD-10-CM | POA: Diagnosis not present

## 2017-07-08 DIAGNOSIS — H2513 Age-related nuclear cataract, bilateral: Secondary | ICD-10-CM | POA: Diagnosis not present

## 2017-07-08 DIAGNOSIS — H25013 Cortical age-related cataract, bilateral: Secondary | ICD-10-CM | POA: Diagnosis not present

## 2017-07-09 ENCOUNTER — Telehealth: Payer: Self-pay | Admitting: *Deleted

## 2017-07-09 MED ORDER — BLOOD GLUCOSE MONITOR KIT: PACK | 11 refills | Status: DC

## 2017-07-09 MED ORDER — BLOOD GLUCOSE MONITOR KIT: PACK | 11 refills | Status: AC

## 2017-07-09 NOTE — Telephone Encounter (Signed)
Can we do

## 2017-07-09 NOTE — Telephone Encounter (Signed)
I have not seen this pt

## 2017-07-09 NOTE — Telephone Encounter (Signed)
Received fax from [facility] that Accu-chek aviva test strips are no longer covered under her insurance plan. Insurance will pay for one touch, freestyle and precision meter / test strips.

## 2017-07-09 NOTE — Telephone Encounter (Signed)
Script sent  

## 2017-07-09 NOTE — Telephone Encounter (Signed)
Ok to change

## 2017-07-12 ENCOUNTER — Other Ambulatory Visit: Payer: Self-pay | Admitting: Family Medicine

## 2017-07-19 ENCOUNTER — Ambulatory Visit (INDEPENDENT_AMBULATORY_CARE_PROVIDER_SITE_OTHER): Payer: PPO | Admitting: Family Medicine

## 2017-07-19 ENCOUNTER — Encounter: Payer: Self-pay | Admitting: Family Medicine

## 2017-07-19 VITALS — BP 148/68 | HR 86 | Temp 98.3°F | Resp 18 | Wt 219.4 lb

## 2017-07-19 DIAGNOSIS — E119 Type 2 diabetes mellitus without complications: Secondary | ICD-10-CM

## 2017-07-19 DIAGNOSIS — B373 Candidiasis of vulva and vagina: Secondary | ICD-10-CM | POA: Diagnosis not present

## 2017-07-19 DIAGNOSIS — B3731 Acute candidiasis of vulva and vagina: Secondary | ICD-10-CM | POA: Insufficient documentation

## 2017-07-19 DIAGNOSIS — Z794 Long term (current) use of insulin: Secondary | ICD-10-CM

## 2017-07-19 DIAGNOSIS — R350 Frequency of micturition: Secondary | ICD-10-CM | POA: Diagnosis not present

## 2017-07-19 LAB — POC URINALSYSI DIPSTICK (AUTOMATED)
Bilirubin, UA: NEGATIVE
Blood, UA: NEGATIVE
Glucose, UA: NEGATIVE
Ketones, UA: NEGATIVE
Leukocytes, UA: NEGATIVE
Nitrite, UA: NEGATIVE
Protein, UA: NEGATIVE
Spec Grav, UA: 1.015 (ref 1.010–1.025)
Urobilinogen, UA: 0.2 E.U./dL
pH, UA: 6.5 (ref 5.0–8.0)

## 2017-07-19 LAB — POCT GLYCOSYLATED HEMOGLOBIN (HGB A1C): Hemoglobin A1C: 7

## 2017-07-19 MED ORDER — FLUCONAZOLE 150 MG PO TABS
ORAL_TABLET | ORAL | 0 refills | Status: DC
Start: 2017-07-19 — End: 2019-05-30

## 2017-07-19 MED ORDER — GLUCOSE BLOOD VI STRP: ORAL_STRIP | 11 refills | Status: DC

## 2017-07-19 NOTE — Progress Notes (Signed)
Subjective:  Patient ID: Donna Fuller, female    DOB: 23-Apr-1941  Age: 76 y.o. MRN: [id number]  CC: Elevated blood sugars, concern for UTI, yeast infection  HPI:  76 year old female with DM 2, CKD, hypertension, hyperlipidemia presents with the above complaints.  DM-2  Patient reports that her blood sugars have been quite labile. She states that they have recently been elevated. She seems some readings in the 200s and 300s.  She states that her fastings have been ranging from 76-262. She states that she is compliant with 54 units of 70/30 twice a day.  No hypoglycemia.  Needs A1c.  Yeast infection  Patient reports that for the past few days she's had white vaginal discharge, associated itching, and odor. She has used an over-the-counter topical agent without improvement. She is requesting medication for this today.  ? UTI  Patient reports that she has had recent urinary frequency. No dysuria.  She describes some abdominal discomfort as "burning in my stomach".  No hematuria.  No flank pain. She does have chronic back pain.  She has taken Azo with some improvement.  No fever.  No other associated symptoms. No other complaints at this time.  Social Hx   Social History   Social History  . Marital status: Married    Spouse name: [name]  . Number of children: 3  . Years of education: 2   Social History Main Topics  . Smoking status: Former Research scientist (life sciences)  . Smokeless tobacco: Never Used  . Alcohol use No  . Drug use: No  . Sexual activity: Not Asked   Other Topics Concern  . None   Social History Narrative   Marital Status:  Married Manufacturing systems engineer)    Children:  Daughter(1) Son (1)    Pets:  None    Living Situation: Lives with husband and daughter.     Occupation:  Retired Licensed conveyancer)    Education: 12th Grade    Tobacco Use/Exposure:  She used to smoke socially during the weekends but quit 40 years ago.    Alcohol Use:  Occasional   Drug Use:  None   Diet:  Regular   Exercise:  None   Hobbies:  Reading and playing volleyball                 Review of Systems  Constitutional: Negative.   Genitourinary: Positive for frequency and vaginal discharge.   Objective:  BP (!) 148/68 (BP Location: Left Arm, Patient Position: Sitting, Cuff Size: Large)   Pulse 86   Temp 98.3 F (36.8 C) (Oral)   Resp 18   Wt 219 lb 6 oz (99.5 kg)   SpO2 95%   BMI 40.12 kg/m   BP/Weight 07/19/2017 05/20/2017 7/32/2025  Systolic BP 427 062 376  Diastolic BP 68 88 81  Wt. (Lbs) 219.38 213 215  BMI 40.12 38.96 39.32    Physical Exam  Constitutional: She is oriented to person, place, and time. She appears well-developed. No distress.  Cardiovascular: Normal rate and regular rhythm.   Murmur heard. Pulmonary/Chest: Effort normal. She has no wheezes. She has no rales.  Abdominal: Soft. She exhibits no distension. There is no tenderness.  Neurological: She is alert and oriented to person, place, and time.  Psychiatric: She has a normal mood and affect.  Vitals reviewed.   Lab Results  Component Value Date   WBC 8.1 05/11/2017   HGB 13.1 05/11/2017   HCT 40.0 05/11/2017   PLT 177 05/11/2017   GLUCOSE  206 (H) 05/28/2017   CHOL 166 12/30/2016   TRIG 202.0 (H) 12/30/2016   HDL 46.20 12/30/2016   LDLDIRECT 76.0 12/30/2016   NA 140 05/28/2017   K 4.3 05/28/2017   CL 103 05/28/2017   CREATININE 1.35 (H) 05/28/2017   BUN 24 (H) 05/28/2017   CO2 30 05/28/2017   TSH 1.350 12/12/2014   HGBA1C 7.0 07/19/2017   MICROALBUR 0.4 04/21/2017    Assessment & Plan:   Problem List Items Addressed This Visit    Diabetes mellitus type 2, controlled ([place]) - Primary    A1C at goal. 7.0 today. Advised her that although her sugars are fluctuating the average out to a goal A1c. Continue current insulin regimen.      Relevant Orders   POCT glycosylated hemoglobin (Hb A1C) (Completed)   Yeast vaginitis    New problem. History consistent with yeast vaginitis. Treating  with Diflucan.      Relevant Medications   fluconazole (DIFLUCAN) 150 MG tablet   Urinary frequency    New problem. Urinalysis negative.  No evidence of infection. We'll continue to monitor.      Relevant Orders   POCT Urinalysis Dipstick (Automated) (Completed)      Meds ordered this encounter  Medications  . glucose blood (ONE TOUCH ULTRA TEST) test strip    Sig: Use as instructed to check blood sugar 2-3 times a day.  DX E11.9    Dispense:  100 each    Refill:  11  . fluconazole (DIFLUCAN) 150 MG tablet    Sig: 1 tablet once. Repeat dosing in 72 hours.    Dispense:  2 tablet    Refill:  0   Follow-up: PRN  [place]

## 2017-07-19 NOTE — Assessment & Plan Note (Signed)
New problem. History consistent with yeast vaginitis. Treating with Diflucan.

## 2017-07-19 NOTE — Assessment & Plan Note (Signed)
New problem. Urinalysis negative.  No evidence of infection. We'll continue to monitor.

## 2017-07-19 NOTE — Assessment & Plan Note (Signed)
A1C at goal. 7.0 today. Advised her that although her sugars are fluctuating the average out to a goal A1c. Continue current insulin regimen.

## 2017-07-19 NOTE — Patient Instructions (Signed)
A1c at goal.  Urine normal.  Rx sent for yeast infection.  Take care  Dr. Lacinda Axon

## 2017-08-05 ENCOUNTER — Other Ambulatory Visit: Payer: Self-pay | Admitting: Family Medicine

## 2017-08-12 DIAGNOSIS — H401131 Primary open-angle glaucoma, bilateral, mild stage: Secondary | ICD-10-CM | POA: Diagnosis not present

## 2017-08-16 ENCOUNTER — Telehealth: Payer: Self-pay | Admitting: Family Medicine

## 2017-08-16 NOTE — Telephone Encounter (Signed)
That is fine 

## 2017-08-16 NOTE — Telephone Encounter (Signed)
Patient has 2 missing [facility] quality Metric Goals BP <140/90 and Microalbumin Ua can schedule nurse visit for BP and a Microalbumin patient does not have first establish care visit until 1/19?

## 2017-08-19 ENCOUNTER — Ambulatory Visit: Payer: PPO | Admitting: Family Medicine

## 2017-08-30 ENCOUNTER — Other Ambulatory Visit: Payer: Self-pay | Admitting: Family Medicine

## 2017-09-06 ENCOUNTER — Other Ambulatory Visit: Payer: Self-pay | Admitting: Family

## 2017-09-06 NOTE — Telephone Encounter (Signed)
Rx approved and sent to the pharmacy by e-script.//AB/CMA 

## 2017-09-25 ENCOUNTER — Other Ambulatory Visit: Payer: Self-pay | Admitting: Family Medicine

## 2017-09-27 ENCOUNTER — Other Ambulatory Visit: Payer: Self-pay | Admitting: Family Medicine

## 2017-10-07 DIAGNOSIS — Z96653 Presence of artificial knee joint, bilateral: Secondary | ICD-10-CM | POA: Diagnosis not present

## 2017-10-07 DIAGNOSIS — Z471 Aftercare following joint replacement surgery: Secondary | ICD-10-CM | POA: Diagnosis not present

## 2017-10-22 ENCOUNTER — Other Ambulatory Visit: Payer: Self-pay | Admitting: Family Medicine

## 2017-11-06 ENCOUNTER — Other Ambulatory Visit: Payer: Self-pay | Admitting: Family Medicine

## 2017-11-10 ENCOUNTER — Other Ambulatory Visit (HOSPITAL_BASED_OUTPATIENT_CLINIC_OR_DEPARTMENT_OTHER): Payer: Self-pay | Admitting: Internal Medicine

## 2017-11-10 DIAGNOSIS — Z1231 Encounter for screening mammogram for malignant neoplasm of breast: Secondary | ICD-10-CM

## 2017-11-16 ENCOUNTER — Other Ambulatory Visit: Payer: Self-pay | Admitting: Family Medicine

## 2017-11-17 DIAGNOSIS — R5383 Other fatigue: Secondary | ICD-10-CM | POA: Diagnosis not present

## 2017-11-17 DIAGNOSIS — E559 Vitamin D deficiency, unspecified: Secondary | ICD-10-CM | POA: Diagnosis not present

## 2017-11-17 DIAGNOSIS — Z79899 Other long term (current) drug therapy: Secondary | ICD-10-CM | POA: Diagnosis not present

## 2017-11-17 DIAGNOSIS — E119 Type 2 diabetes mellitus without complications: Secondary | ICD-10-CM | POA: Diagnosis not present

## 2017-11-17 DIAGNOSIS — E7800 Pure hypercholesterolemia, unspecified: Secondary | ICD-10-CM | POA: Diagnosis not present

## 2017-11-17 DIAGNOSIS — Z794 Long term (current) use of insulin: Secondary | ICD-10-CM | POA: Diagnosis not present

## 2017-11-18 ENCOUNTER — Inpatient Hospital Stay (HOSPITAL_BASED_OUTPATIENT_CLINIC_OR_DEPARTMENT_OTHER): Admission: RE | Admit: 2017-11-18 | Payer: PPO | Source: Ambulatory Visit

## 2017-11-19 NOTE — Telephone Encounter (Signed)
Last OV with Dr.Cook 07/19/17 last filled 10/25/17 100 0rf

## 2017-11-22 ENCOUNTER — Ambulatory Visit (HOSPITAL_BASED_OUTPATIENT_CLINIC_OR_DEPARTMENT_OTHER)
Admission: RE | Admit: 2017-11-22 | Discharge: 2017-11-22 | Disposition: A | Discharge status: Home / Self Care | Payer: PPO | Source: Ambulatory Visit | Attending: Internal Medicine | Admitting: Internal Medicine

## 2017-11-22 ENCOUNTER — Encounter (HOSPITAL_BASED_OUTPATIENT_CLINIC_OR_DEPARTMENT_OTHER): Payer: Self-pay

## 2017-11-22 DIAGNOSIS — Z1231 Encounter for screening mammogram for malignant neoplasm of breast: Secondary | ICD-10-CM

## 2017-12-01 DIAGNOSIS — I10 Essential (primary) hypertension: Secondary | ICD-10-CM | POA: Diagnosis not present

## 2017-12-01 DIAGNOSIS — Z794 Long term (current) use of insulin: Secondary | ICD-10-CM | POA: Diagnosis not present

## 2017-12-01 DIAGNOSIS — E559 Vitamin D deficiency, unspecified: Secondary | ICD-10-CM | POA: Diagnosis not present

## 2017-12-01 DIAGNOSIS — E119 Type 2 diabetes mellitus without complications: Secondary | ICD-10-CM | POA: Diagnosis not present

## 2017-12-06 ENCOUNTER — Ambulatory Visit: Payer: PPO | Admitting: Family Medicine

## 2018-01-16 DIAGNOSIS — J209 Acute bronchitis, unspecified: Secondary | ICD-10-CM | POA: Diagnosis not present

## 2018-01-16 DIAGNOSIS — B373 Candidiasis of vulva and vagina: Secondary | ICD-10-CM | POA: Diagnosis not present

## 2018-01-16 DIAGNOSIS — R509 Fever, unspecified: Secondary | ICD-10-CM | POA: Diagnosis not present

## 2018-01-16 DIAGNOSIS — R05 Cough: Secondary | ICD-10-CM | POA: Diagnosis not present

## 2018-01-21 DIAGNOSIS — J40 Bronchitis, not specified as acute or chronic: Secondary | ICD-10-CM | POA: Diagnosis not present

## 2018-01-21 DIAGNOSIS — E119 Type 2 diabetes mellitus without complications: Secondary | ICD-10-CM | POA: Diagnosis not present

## 2018-01-21 DIAGNOSIS — J111 Influenza due to unidentified influenza virus with other respiratory manifestations: Secondary | ICD-10-CM | POA: Diagnosis not present

## 2018-01-21 DIAGNOSIS — E7800 Pure hypercholesterolemia, unspecified: Secondary | ICD-10-CM | POA: Diagnosis not present

## 2018-03-01 DIAGNOSIS — M25561 Pain in right knee: Secondary | ICD-10-CM | POA: Diagnosis not present

## 2018-03-01 DIAGNOSIS — M25562 Pain in left knee: Secondary | ICD-10-CM | POA: Diagnosis not present

## 2018-03-01 DIAGNOSIS — E7800 Pure hypercholesterolemia, unspecified: Secondary | ICD-10-CM | POA: Diagnosis not present

## 2018-03-01 DIAGNOSIS — E119 Type 2 diabetes mellitus without complications: Secondary | ICD-10-CM | POA: Diagnosis not present

## 2018-03-01 DIAGNOSIS — Z79899 Other long term (current) drug therapy: Secondary | ICD-10-CM | POA: Diagnosis not present

## 2018-03-01 DIAGNOSIS — M545 Low back pain: Secondary | ICD-10-CM | POA: Diagnosis not present

## 2018-03-01 DIAGNOSIS — Z794 Long term (current) use of insulin: Secondary | ICD-10-CM | POA: Diagnosis not present

## 2018-03-01 DIAGNOSIS — G8929 Other chronic pain: Secondary | ICD-10-CM | POA: Diagnosis not present

## 2018-03-01 DIAGNOSIS — M129 Arthropathy, unspecified: Secondary | ICD-10-CM | POA: Diagnosis not present

## 2018-03-10 DIAGNOSIS — E119 Type 2 diabetes mellitus without complications: Secondary | ICD-10-CM | POA: Diagnosis not present

## 2018-03-10 DIAGNOSIS — Z794 Long term (current) use of insulin: Secondary | ICD-10-CM | POA: Diagnosis not present

## 2018-03-10 DIAGNOSIS — M545 Low back pain: Secondary | ICD-10-CM | POA: Diagnosis not present

## 2018-03-10 DIAGNOSIS — E7800 Pure hypercholesterolemia, unspecified: Secondary | ICD-10-CM | POA: Diagnosis not present

## 2018-03-10 DIAGNOSIS — G8929 Other chronic pain: Secondary | ICD-10-CM | POA: Diagnosis not present

## 2018-03-10 DIAGNOSIS — I10 Essential (primary) hypertension: Secondary | ICD-10-CM | POA: Diagnosis not present

## 2018-03-10 DIAGNOSIS — Z79899 Other long term (current) drug therapy: Secondary | ICD-10-CM | POA: Diagnosis not present

## 2018-03-22 ENCOUNTER — Other Ambulatory Visit: Payer: Self-pay

## 2018-03-22 NOTE — Patient Outreach (Signed)
[facility]) Care Management  03/22/2018  [name] 04-01-1941 [id number]   77 year old female referred to [facility] for medication assistance.  [facility] services requested for Lantus.   PMHx includes, but not limited to, hypertension, GERD, Type 2 diabetes mellitus, chronic kidney disease stage 3, hyperlipidemia.  Successful outreach attempt to Ms. Cosma.  HIPAA identifiers verified.   Subjective: Donna Fuller states that she was started on Lantus this past January.  She states that she had been on 70/30 insulin, but that her doctor said she really needed to change to long acting insulin.  She reports her last HgA1c was 6.7% in March.  Patient states that she cannot afford Lantus.  She reports using 52 units twice daily of Lantus, which will require about 3 vials/month.  Patient has called the doctors office to see if they have any samples and is awaiting return call.    Medication Assistance: Lantus is made by [employer] and they require a patient spend 5% of their income out of pocket on prescription expeditures to qualify for their medication assistance program.  Per [employer] patient has a TROOP of $375.11 for 2019.  Patient is currently in the donut hole.    Based on financial discussion with Ms. Deboer, she does not qualify for Extra Help LIS.   Recommended that patient contact doctor again to see if samples available.  All long acting insulins require at least a $1000 out of pocket prescription expenditure by the patient to apply for medication assistance.  I informed patient that I will close her [facility] case at these time and asked that she please contact me when she has met the required out of pocket expense and I will help start the patient assistance application process for Lantus.  Plan:  Route note and discipline closure letter to PCP, Dr. Caryl Bis.   In-basket message to [facility] RN to notify of pharmacy case  closure.  Joetta Manners, PharmD Clinical Pharmacist [place] [phone]

## 2018-03-30 DIAGNOSIS — G8929 Other chronic pain: Secondary | ICD-10-CM | POA: Diagnosis not present

## 2018-03-30 DIAGNOSIS — M545 Low back pain: Secondary | ICD-10-CM | POA: Diagnosis not present

## 2018-03-30 DIAGNOSIS — R768 Other specified abnormal immunological findings in serum: Secondary | ICD-10-CM | POA: Diagnosis not present

## 2018-03-30 DIAGNOSIS — E119 Type 2 diabetes mellitus without complications: Secondary | ICD-10-CM | POA: Diagnosis not present

## 2018-03-30 DIAGNOSIS — R945 Abnormal results of liver function studies: Secondary | ICD-10-CM | POA: Diagnosis not present

## 2018-03-30 DIAGNOSIS — Z794 Long term (current) use of insulin: Secondary | ICD-10-CM | POA: Diagnosis not present

## 2018-04-01 DIAGNOSIS — R0989 Other specified symptoms and signs involving the circulatory and respiratory systems: Secondary | ICD-10-CM | POA: Diagnosis not present

## 2018-04-01 DIAGNOSIS — N183 Chronic kidney disease, stage 3 (moderate): Secondary | ICD-10-CM | POA: Diagnosis not present

## 2018-04-01 DIAGNOSIS — M79605 Pain in left leg: Secondary | ICD-10-CM | POA: Diagnosis not present

## 2018-04-01 DIAGNOSIS — R945 Abnormal results of liver function studies: Secondary | ICD-10-CM | POA: Diagnosis not present

## 2018-04-01 DIAGNOSIS — M79604 Pain in right leg: Secondary | ICD-10-CM | POA: Diagnosis not present

## 2018-04-07 DIAGNOSIS — E119 Type 2 diabetes mellitus without complications: Secondary | ICD-10-CM | POA: Diagnosis not present

## 2018-04-13 DIAGNOSIS — M545 Low back pain: Secondary | ICD-10-CM | POA: Diagnosis not present

## 2018-04-13 DIAGNOSIS — Z79899 Other long term (current) drug therapy: Secondary | ICD-10-CM | POA: Diagnosis not present

## 2018-04-13 DIAGNOSIS — E119 Type 2 diabetes mellitus without complications: Secondary | ICD-10-CM | POA: Diagnosis not present

## 2018-04-13 DIAGNOSIS — G8929 Other chronic pain: Secondary | ICD-10-CM | POA: Diagnosis not present

## 2018-04-13 DIAGNOSIS — Z794 Long term (current) use of insulin: Secondary | ICD-10-CM | POA: Diagnosis not present

## 2018-05-09 ENCOUNTER — Other Ambulatory Visit: Payer: Self-pay | Admitting: Family Medicine

## 2018-05-10 NOTE — Telephone Encounter (Signed)
Last OV with DrCook last filled by Dr.Cook 05/20/17 90 3rf

## 2018-05-10 NOTE — Telephone Encounter (Signed)
Patient states she is no longer a patient at [facility] she now sees Dr.Sun at [facility]

## 2018-05-10 NOTE — Telephone Encounter (Signed)
It has been almost a year since her last lab work and she is overdue for a visit. She needs to have a lab appointment scheduled for as soon as possible and an office visit scheduled and then I will refill for enough to cover until her visit. Please also see what her blood pressure has been at home. Thanks.

## 2018-05-13 DIAGNOSIS — H401132 Primary open-angle glaucoma, bilateral, moderate stage: Secondary | ICD-10-CM | POA: Diagnosis not present

## 2018-05-13 DIAGNOSIS — H2513 Age-related nuclear cataract, bilateral: Secondary | ICD-10-CM | POA: Diagnosis not present

## 2018-05-16 ENCOUNTER — Other Ambulatory Visit: Payer: Self-pay | Admitting: Family Medicine

## 2018-05-26 DIAGNOSIS — M545 Low back pain: Secondary | ICD-10-CM | POA: Diagnosis not present

## 2018-05-26 DIAGNOSIS — Z794 Long term (current) use of insulin: Secondary | ICD-10-CM | POA: Diagnosis not present

## 2018-05-26 DIAGNOSIS — Z79899 Other long term (current) drug therapy: Secondary | ICD-10-CM | POA: Diagnosis not present

## 2018-05-26 DIAGNOSIS — G8929 Other chronic pain: Secondary | ICD-10-CM | POA: Diagnosis not present

## 2018-05-26 DIAGNOSIS — E119 Type 2 diabetes mellitus without complications: Secondary | ICD-10-CM | POA: Diagnosis not present

## 2018-05-30 DIAGNOSIS — H401132 Primary open-angle glaucoma, bilateral, moderate stage: Secondary | ICD-10-CM | POA: Diagnosis not present

## 2018-05-30 DIAGNOSIS — H2512 Age-related nuclear cataract, left eye: Secondary | ICD-10-CM | POA: Diagnosis not present

## 2018-05-30 DIAGNOSIS — H2513 Age-related nuclear cataract, bilateral: Secondary | ICD-10-CM | POA: Diagnosis not present

## 2018-06-23 DIAGNOSIS — M545 Low back pain: Secondary | ICD-10-CM | POA: Diagnosis not present

## 2018-06-23 DIAGNOSIS — G8929 Other chronic pain: Secondary | ICD-10-CM | POA: Diagnosis not present

## 2018-06-23 DIAGNOSIS — M129 Arthropathy, unspecified: Secondary | ICD-10-CM | POA: Diagnosis not present

## 2018-06-23 DIAGNOSIS — E7800 Pure hypercholesterolemia, unspecified: Secondary | ICD-10-CM | POA: Diagnosis not present

## 2018-06-23 DIAGNOSIS — R5383 Other fatigue: Secondary | ICD-10-CM | POA: Diagnosis not present

## 2018-06-23 DIAGNOSIS — E559 Vitamin D deficiency, unspecified: Secondary | ICD-10-CM | POA: Diagnosis not present

## 2018-06-23 DIAGNOSIS — E119 Type 2 diabetes mellitus without complications: Secondary | ICD-10-CM | POA: Diagnosis not present

## 2018-06-23 DIAGNOSIS — Z794 Long term (current) use of insulin: Secondary | ICD-10-CM | POA: Diagnosis not present

## 2018-06-23 DIAGNOSIS — Z79899 Other long term (current) drug therapy: Secondary | ICD-10-CM | POA: Diagnosis not present

## 2018-06-23 DIAGNOSIS — I10 Essential (primary) hypertension: Secondary | ICD-10-CM | POA: Diagnosis not present

## 2018-06-23 DIAGNOSIS — Z0000 Encounter for general adult medical examination without abnormal findings: Secondary | ICD-10-CM | POA: Diagnosis not present

## 2018-07-07 DIAGNOSIS — Z961 Presence of intraocular lens: Secondary | ICD-10-CM | POA: Diagnosis not present

## 2018-07-07 DIAGNOSIS — H5202 Hypermetropia, left eye: Secondary | ICD-10-CM | POA: Diagnosis not present

## 2018-07-07 DIAGNOSIS — H2512 Age-related nuclear cataract, left eye: Secondary | ICD-10-CM | POA: Diagnosis not present

## 2018-07-07 DIAGNOSIS — H401122 Primary open-angle glaucoma, left eye, moderate stage: Secondary | ICD-10-CM | POA: Diagnosis not present

## 2018-07-07 DIAGNOSIS — H52222 Regular astigmatism, left eye: Secondary | ICD-10-CM | POA: Diagnosis not present

## 2018-07-08 DIAGNOSIS — H2511 Age-related nuclear cataract, right eye: Secondary | ICD-10-CM | POA: Diagnosis not present

## 2018-07-22 DIAGNOSIS — Z794 Long term (current) use of insulin: Secondary | ICD-10-CM | POA: Diagnosis not present

## 2018-07-22 DIAGNOSIS — E119 Type 2 diabetes mellitus without complications: Secondary | ICD-10-CM | POA: Diagnosis not present

## 2018-07-22 DIAGNOSIS — M545 Low back pain: Secondary | ICD-10-CM | POA: Diagnosis not present

## 2018-07-22 DIAGNOSIS — G8929 Other chronic pain: Secondary | ICD-10-CM | POA: Diagnosis not present

## 2018-07-23 ENCOUNTER — Other Ambulatory Visit (HOSPITAL_BASED_OUTPATIENT_CLINIC_OR_DEPARTMENT_OTHER): Payer: Self-pay | Admitting: Internal Medicine

## 2018-07-23 DIAGNOSIS — Z1231 Encounter for screening mammogram for malignant neoplasm of breast: Secondary | ICD-10-CM

## 2018-07-28 DIAGNOSIS — H2511 Age-related nuclear cataract, right eye: Secondary | ICD-10-CM | POA: Diagnosis not present

## 2018-07-28 DIAGNOSIS — H2702 Aphakia, left eye: Secondary | ICD-10-CM | POA: Diagnosis not present

## 2018-07-28 DIAGNOSIS — H2701 Aphakia, right eye: Secondary | ICD-10-CM | POA: Diagnosis not present

## 2018-08-02 DIAGNOSIS — E113392 Type 2 diabetes mellitus with moderate nonproliferative diabetic retinopathy without macular edema, left eye: Secondary | ICD-10-CM | POA: Diagnosis not present

## 2018-08-02 DIAGNOSIS — H2701 Aphakia, right eye: Secondary | ICD-10-CM | POA: Diagnosis not present

## 2018-08-02 DIAGNOSIS — H59021 Cataract (lens) fragments in eye following cataract surgery, right eye: Secondary | ICD-10-CM | POA: Diagnosis not present

## 2018-08-02 DIAGNOSIS — H401132 Primary open-angle glaucoma, bilateral, moderate stage: Secondary | ICD-10-CM | POA: Diagnosis not present

## 2018-08-03 DIAGNOSIS — H2701 Aphakia, right eye: Secondary | ICD-10-CM | POA: Diagnosis not present

## 2018-08-03 DIAGNOSIS — H59022 Cataract (lens) fragments in eye following cataract surgery, left eye: Secondary | ICD-10-CM | POA: Diagnosis not present

## 2018-08-03 DIAGNOSIS — H21221 Degeneration of ciliary body, right eye: Secondary | ICD-10-CM | POA: Diagnosis not present

## 2018-08-03 DIAGNOSIS — H2702 Aphakia, left eye: Secondary | ICD-10-CM | POA: Diagnosis not present

## 2018-08-03 DIAGNOSIS — H59021 Cataract (lens) fragments in eye following cataract surgery, right eye: Secondary | ICD-10-CM | POA: Diagnosis not present

## 2018-08-11 DIAGNOSIS — Z09 Encounter for follow-up examination after completed treatment for conditions other than malignant neoplasm: Secondary | ICD-10-CM | POA: Diagnosis not present

## 2018-08-11 DIAGNOSIS — E113392 Type 2 diabetes mellitus with moderate nonproliferative diabetic retinopathy without macular edema, left eye: Secondary | ICD-10-CM | POA: Diagnosis not present

## 2018-08-15 ENCOUNTER — Other Ambulatory Visit: Payer: Self-pay | Admitting: Family Medicine

## 2018-10-09 ENCOUNTER — Other Ambulatory Visit: Payer: Self-pay | Admitting: Family Medicine

## 2018-10-11 DIAGNOSIS — E559 Vitamin D deficiency, unspecified: Secondary | ICD-10-CM | POA: Diagnosis not present

## 2018-10-11 DIAGNOSIS — Z794 Long term (current) use of insulin: Secondary | ICD-10-CM | POA: Diagnosis not present

## 2018-10-11 DIAGNOSIS — E7800 Pure hypercholesterolemia, unspecified: Secondary | ICD-10-CM | POA: Diagnosis not present

## 2018-10-11 DIAGNOSIS — E119 Type 2 diabetes mellitus without complications: Secondary | ICD-10-CM | POA: Diagnosis not present

## 2018-10-14 DIAGNOSIS — H43811 Vitreous degeneration, right eye: Secondary | ICD-10-CM | POA: Diagnosis not present

## 2018-10-14 DIAGNOSIS — H401132 Primary open-angle glaucoma, bilateral, moderate stage: Secondary | ICD-10-CM | POA: Diagnosis not present

## 2018-10-14 DIAGNOSIS — E119 Type 2 diabetes mellitus without complications: Secondary | ICD-10-CM | POA: Diagnosis not present

## 2018-10-14 DIAGNOSIS — Z961 Presence of intraocular lens: Secondary | ICD-10-CM | POA: Diagnosis not present

## 2018-10-15 ENCOUNTER — Ambulatory Visit (HOSPITAL_BASED_OUTPATIENT_CLINIC_OR_DEPARTMENT_OTHER): Payer: PPO

## 2018-11-10 DIAGNOSIS — E559 Vitamin D deficiency, unspecified: Secondary | ICD-10-CM | POA: Diagnosis not present

## 2018-11-10 DIAGNOSIS — Z794 Long term (current) use of insulin: Secondary | ICD-10-CM | POA: Diagnosis not present

## 2018-11-10 DIAGNOSIS — E7800 Pure hypercholesterolemia, unspecified: Secondary | ICD-10-CM | POA: Diagnosis not present

## 2018-11-10 DIAGNOSIS — E119 Type 2 diabetes mellitus without complications: Secondary | ICD-10-CM | POA: Diagnosis not present

## 2018-11-25 ENCOUNTER — Ambulatory Visit (HOSPITAL_BASED_OUTPATIENT_CLINIC_OR_DEPARTMENT_OTHER)
Admission: RE | Admit: 2018-11-25 | Discharge: 2018-11-25 | Disposition: A | Discharge status: Home / Self Care | Payer: PPO | Source: Ambulatory Visit | Attending: Internal Medicine | Admitting: Internal Medicine

## 2018-11-25 ENCOUNTER — Encounter (HOSPITAL_BASED_OUTPATIENT_CLINIC_OR_DEPARTMENT_OTHER): Payer: Self-pay

## 2018-11-25 DIAGNOSIS — Z1231 Encounter for screening mammogram for malignant neoplasm of breast: Secondary | ICD-10-CM | POA: Diagnosis not present

## 2018-11-26 ENCOUNTER — Ambulatory Visit (HOSPITAL_BASED_OUTPATIENT_CLINIC_OR_DEPARTMENT_OTHER): Payer: PPO

## 2019-01-12 DIAGNOSIS — E7800 Pure hypercholesterolemia, unspecified: Secondary | ICD-10-CM | POA: Diagnosis not present

## 2019-01-12 DIAGNOSIS — Z79899 Other long term (current) drug therapy: Secondary | ICD-10-CM | POA: Diagnosis not present

## 2019-01-12 DIAGNOSIS — Z794 Long term (current) use of insulin: Secondary | ICD-10-CM | POA: Diagnosis not present

## 2019-01-12 DIAGNOSIS — E119 Type 2 diabetes mellitus without complications: Secondary | ICD-10-CM | POA: Diagnosis not present

## 2019-01-12 DIAGNOSIS — E559 Vitamin D deficiency, unspecified: Secondary | ICD-10-CM | POA: Diagnosis not present

## 2019-03-07 DIAGNOSIS — M545 Low back pain: Secondary | ICD-10-CM | POA: Diagnosis not present

## 2019-03-07 DIAGNOSIS — G8929 Other chronic pain: Secondary | ICD-10-CM | POA: Diagnosis not present

## 2019-03-07 DIAGNOSIS — E559 Vitamin D deficiency, unspecified: Secondary | ICD-10-CM | POA: Diagnosis not present

## 2019-03-07 DIAGNOSIS — E7800 Pure hypercholesterolemia, unspecified: Secondary | ICD-10-CM | POA: Diagnosis not present

## 2019-03-07 DIAGNOSIS — N390 Urinary tract infection, site not specified: Secondary | ICD-10-CM | POA: Diagnosis not present

## 2019-04-03 DIAGNOSIS — Z794 Long term (current) use of insulin: Secondary | ICD-10-CM | POA: Diagnosis not present

## 2019-04-03 DIAGNOSIS — E119 Type 2 diabetes mellitus without complications: Secondary | ICD-10-CM | POA: Diagnosis not present

## 2019-04-03 DIAGNOSIS — M545 Low back pain: Secondary | ICD-10-CM | POA: Diagnosis not present

## 2019-04-03 DIAGNOSIS — G8929 Other chronic pain: Secondary | ICD-10-CM | POA: Diagnosis not present

## 2019-04-18 DIAGNOSIS — Z961 Presence of intraocular lens: Secondary | ICD-10-CM | POA: Diagnosis not present

## 2019-04-18 DIAGNOSIS — E113392 Type 2 diabetes mellitus with moderate nonproliferative diabetic retinopathy without macular edema, left eye: Secondary | ICD-10-CM | POA: Diagnosis not present

## 2019-04-18 DIAGNOSIS — H35351 Cystoid macular degeneration, right eye: Secondary | ICD-10-CM | POA: Diagnosis not present

## 2019-04-18 DIAGNOSIS — H401132 Primary open-angle glaucoma, bilateral, moderate stage: Secondary | ICD-10-CM | POA: Diagnosis not present

## 2019-05-05 DIAGNOSIS — H401132 Primary open-angle glaucoma, bilateral, moderate stage: Secondary | ICD-10-CM | POA: Diagnosis not present

## 2019-05-05 DIAGNOSIS — E119 Type 2 diabetes mellitus without complications: Secondary | ICD-10-CM | POA: Diagnosis not present

## 2019-05-05 DIAGNOSIS — Z794 Long term (current) use of insulin: Secondary | ICD-10-CM | POA: Diagnosis not present

## 2019-05-05 DIAGNOSIS — E11311 Type 2 diabetes mellitus with unspecified diabetic retinopathy with macular edema: Secondary | ICD-10-CM | POA: Diagnosis not present

## 2019-05-08 DIAGNOSIS — Z794 Long term (current) use of insulin: Secondary | ICD-10-CM | POA: Diagnosis not present

## 2019-05-08 DIAGNOSIS — E119 Type 2 diabetes mellitus without complications: Secondary | ICD-10-CM | POA: Diagnosis not present

## 2019-05-08 DIAGNOSIS — N183 Chronic kidney disease, stage 3 (moderate): Secondary | ICD-10-CM | POA: Diagnosis not present

## 2019-05-08 DIAGNOSIS — E7800 Pure hypercholesterolemia, unspecified: Secondary | ICD-10-CM | POA: Diagnosis not present

## 2019-05-08 DIAGNOSIS — I10 Essential (primary) hypertension: Secondary | ICD-10-CM | POA: Diagnosis not present

## 2019-05-08 DIAGNOSIS — E559 Vitamin D deficiency, unspecified: Secondary | ICD-10-CM | POA: Diagnosis not present

## 2019-05-08 DIAGNOSIS — Z1159 Encounter for screening for other viral diseases: Secondary | ICD-10-CM | POA: Diagnosis not present

## 2019-05-15 DIAGNOSIS — R600 Localized edema: Secondary | ICD-10-CM | POA: Diagnosis not present

## 2019-05-15 DIAGNOSIS — E119 Type 2 diabetes mellitus without complications: Secondary | ICD-10-CM | POA: Diagnosis not present

## 2019-05-30 ENCOUNTER — Inpatient Hospital Stay (HOSPITAL_COMMUNITY): Payer: PPO

## 2019-05-30 ENCOUNTER — Other Ambulatory Visit: Payer: Self-pay

## 2019-05-30 ENCOUNTER — Inpatient Hospital Stay (HOSPITAL_COMMUNITY)
Admission: EM | Admit: 2019-05-30 | Discharge: 2019-06-03 | DRG: 179 | Disposition: A | Discharge status: Home / Self Care | Payer: PPO | Attending: Internal Medicine | Admitting: Internal Medicine

## 2019-05-30 ENCOUNTER — Emergency Department (HOSPITAL_COMMUNITY): Payer: PPO

## 2019-05-30 DIAGNOSIS — I129 Hypertensive chronic kidney disease with stage 1 through stage 4 chronic kidney disease, or unspecified chronic kidney disease: Secondary | ICD-10-CM | POA: Diagnosis present

## 2019-05-30 DIAGNOSIS — E669 Obesity, unspecified: Secondary | ICD-10-CM | POA: Diagnosis present

## 2019-05-30 DIAGNOSIS — Z833 Family history of diabetes mellitus: Secondary | ICD-10-CM

## 2019-05-30 DIAGNOSIS — Z794 Long term (current) use of insulin: Secondary | ICD-10-CM

## 2019-05-30 DIAGNOSIS — Z6834 Body mass index (BMI) 34.0-34.9, adult: Secondary | ICD-10-CM

## 2019-05-30 DIAGNOSIS — N183 Chronic kidney disease, stage 3 (moderate): Secondary | ICD-10-CM | POA: Diagnosis present

## 2019-05-30 DIAGNOSIS — R1312 Dysphagia, oropharyngeal phase: Secondary | ICD-10-CM | POA: Diagnosis present

## 2019-05-30 DIAGNOSIS — R778 Other specified abnormalities of plasma proteins: Secondary | ICD-10-CM

## 2019-05-30 DIAGNOSIS — E1165 Type 2 diabetes mellitus with hyperglycemia: Secondary | ICD-10-CM | POA: Diagnosis present

## 2019-05-30 DIAGNOSIS — E1122 Type 2 diabetes mellitus with diabetic chronic kidney disease: Secondary | ICD-10-CM | POA: Diagnosis present

## 2019-05-30 DIAGNOSIS — Z87891 Personal history of nicotine dependence: Secondary | ICD-10-CM

## 2019-05-30 DIAGNOSIS — Z20828 Contact with and (suspected) exposure to other viral communicable diseases: Secondary | ICD-10-CM | POA: Diagnosis not present

## 2019-05-30 DIAGNOSIS — Z90710 Acquired absence of both cervix and uterus: Secondary | ICD-10-CM

## 2019-05-30 DIAGNOSIS — R072 Precordial pain: Secondary | ICD-10-CM | POA: Diagnosis not present

## 2019-05-30 DIAGNOSIS — K219 Gastro-esophageal reflux disease without esophagitis: Secondary | ICD-10-CM | POA: Diagnosis not present

## 2019-05-30 DIAGNOSIS — R011 Cardiac murmur, unspecified: Secondary | ICD-10-CM | POA: Diagnosis present

## 2019-05-30 DIAGNOSIS — Z7982 Long term (current) use of aspirin: Secondary | ICD-10-CM

## 2019-05-30 DIAGNOSIS — J181 Lobar pneumonia, unspecified organism: Secondary | ICD-10-CM | POA: Diagnosis not present

## 2019-05-30 DIAGNOSIS — Z888 Allergy status to other drugs, medicaments and biological substances status: Secondary | ICD-10-CM

## 2019-05-30 DIAGNOSIS — R109 Unspecified abdominal pain: Secondary | ICD-10-CM | POA: Diagnosis not present

## 2019-05-30 DIAGNOSIS — R7989 Other specified abnormal findings of blood chemistry: Secondary | ICD-10-CM | POA: Diagnosis present

## 2019-05-30 DIAGNOSIS — Z853 Personal history of malignant neoplasm of breast: Secondary | ICD-10-CM | POA: Diagnosis not present

## 2019-05-30 DIAGNOSIS — R197 Diarrhea, unspecified: Secondary | ICD-10-CM | POA: Diagnosis not present

## 2019-05-30 DIAGNOSIS — Z9011 Acquired absence of right breast and nipple: Secondary | ICD-10-CM

## 2019-05-30 DIAGNOSIS — Z8249 Family history of ischemic heart disease and other diseases of the circulatory system: Secondary | ICD-10-CM

## 2019-05-30 DIAGNOSIS — M109 Gout, unspecified: Secondary | ICD-10-CM | POA: Diagnosis not present

## 2019-05-30 DIAGNOSIS — R079 Chest pain, unspecified: Secondary | ICD-10-CM

## 2019-05-30 DIAGNOSIS — Z880 Allergy status to penicillin: Secondary | ICD-10-CM | POA: Diagnosis not present

## 2019-05-30 DIAGNOSIS — R0781 Pleurodynia: Secondary | ICD-10-CM | POA: Diagnosis not present

## 2019-05-30 DIAGNOSIS — J690 Pneumonitis due to inhalation of food and vomit: Secondary | ICD-10-CM | POA: Diagnosis not present

## 2019-05-30 DIAGNOSIS — R509 Fever, unspecified: Secondary | ICD-10-CM

## 2019-05-30 DIAGNOSIS — R937 Abnormal findings on diagnostic imaging of other parts of musculoskeletal system: Secondary | ICD-10-CM | POA: Diagnosis not present

## 2019-05-30 DIAGNOSIS — Z841 Family history of disorders of kidney and ureter: Secondary | ICD-10-CM

## 2019-05-30 DIAGNOSIS — R410 Disorientation, unspecified: Secondary | ICD-10-CM | POA: Diagnosis present

## 2019-05-30 DIAGNOSIS — Z8349 Family history of other endocrine, nutritional and metabolic diseases: Secondary | ICD-10-CM

## 2019-05-30 DIAGNOSIS — E785 Hyperlipidemia, unspecified: Secondary | ICD-10-CM | POA: Diagnosis present

## 2019-05-30 DIAGNOSIS — Z882 Allergy status to sulfonamides status: Secondary | ICD-10-CM

## 2019-05-30 DIAGNOSIS — J189 Pneumonia, unspecified organism: Secondary | ICD-10-CM | POA: Diagnosis not present

## 2019-05-30 DIAGNOSIS — Z79899 Other long term (current) drug therapy: Secondary | ICD-10-CM

## 2019-05-30 LAB — TROPONIN I (HIGH SENSITIVITY)
Troponin I (High Sensitivity): 66 ng/L — ABNORMAL HIGH (ref <18)
Troponin I (High Sensitivity): 73 ng/L — ABNORMAL HIGH (ref <18)

## 2019-05-30 LAB — BASIC METABOLIC PANEL
Anion gap: 11 (ref 5–15)
BUN: 19 mg/dL (ref 8–23)
CO2: 27 mmol/L (ref 22–32)
Calcium: 9 mg/dL (ref 8.9–10.3)
Chloride: 100 mmol/L (ref 98–111)
Creatinine, Ser: 1.35 mg/dL — ABNORMAL HIGH (ref 0.44–1.00)
GFR calc Af Amer: 44 mL/min — ABNORMAL LOW (ref >60)
GFR calc non Af Amer: 38 mL/min — ABNORMAL LOW (ref >60)
Glucose, Bld: 187 mg/dL — ABNORMAL HIGH (ref 70–99)
Potassium: 4 mmol/L (ref 3.5–5.1)
Sodium: 138 mmol/L (ref 135–145)

## 2019-05-30 LAB — CBC
HCT: 38.6 % (ref 36.0–46.0)
Hemoglobin: 12.4 g/dL (ref 12.0–15.0)
MCH: 28.6 pg (ref 26.0–34.0)
MCHC: 32.1 g/dL (ref 30.0–36.0)
MCV: 88.9 fL (ref 80.0–100.0)
Platelets: 177 10*3/uL (ref 150–400)
RBC: 4.34 MIL/uL (ref 3.87–5.11)
RDW: 14.7 % (ref 11.5–15.5)
WBC: 17.8 10*3/uL — ABNORMAL HIGH (ref 4.0–10.5)
nRBC: 0 % (ref 0.0–0.2)

## 2019-05-30 LAB — SARS CORONAVIRUS 2 BY RT PCR (HOSPITAL ORDER, PERFORMED IN ~~LOC~~ HOSPITAL LAB): SARS Coronavirus 2: NEGATIVE

## 2019-05-30 MED ORDER — INSULIN GLARGINE 100 UNIT/ML ~~LOC~~ SOLN: 15.0000 [IU] | Freq: Every day | SUBCUTANEOUS | Status: DC

## 2019-05-30 MED ORDER — ACETAMINOPHEN 500 MG PO TABS
1000.0000 mg | ORAL_TABLET | Freq: Once | ORAL | Status: AC
  Administered 2019-05-30: 1000 mg via ORAL
  Filled 2019-05-30: qty 2

## 2019-05-30 MED ORDER — GABAPENTIN 300 MG PO CAPS
300.0000 mg | ORAL_CAPSULE | Freq: Three times a day (TID) | ORAL | Status: DC
  Administered 2019-05-30 – 2019-06-03 (×11): 300 mg via ORAL
  Filled 2019-05-30 (×11): qty 1

## 2019-05-30 MED ORDER — SODIUM CHLORIDE 0.9% FLUSH
3.0000 mL | Freq: Two times a day (BID) | INTRAVENOUS | Status: DC
  Administered 2019-05-31 – 2019-06-03 (×8): 3 mL via INTRAVENOUS

## 2019-05-30 MED ORDER — PANTOPRAZOLE SODIUM 40 MG PO TBEC
40.0000 mg | DELAYED_RELEASE_TABLET | Freq: Every day | ORAL | Status: DC
  Administered 2019-05-30 – 2019-06-03 (×5): 40 mg via ORAL
  Filled 2019-05-30 (×5): qty 1

## 2019-05-30 MED ORDER — AZITHROMYCIN 250 MG PO TABS
250.0000 mg | ORAL_TABLET | Freq: Every day | ORAL | Status: DC
  Administered 2019-05-31: 250 mg via ORAL
  Filled 2019-05-30: qty 1

## 2019-05-30 MED ORDER — INSULIN ASPART 100 UNIT/ML ~~LOC~~ SOLN: 0.0000 [IU] | Freq: Every day | SUBCUTANEOUS | Status: DC

## 2019-05-30 MED ORDER — ENOXAPARIN SODIUM 30 MG/0.3ML ~~LOC~~ SOLN
30.0000 mg | SUBCUTANEOUS | Status: DC
  Administered 2019-05-31 – 2019-06-01 (×2): 30 mg via SUBCUTANEOUS
  Filled 2019-05-30 (×3): qty 0.3

## 2019-05-30 MED ORDER — INSULIN ASPART 100 UNIT/ML ~~LOC~~ SOLN
0.0000 [IU] | Freq: Three times a day (TID) | SUBCUTANEOUS | Status: DC
  Administered 2019-05-31 – 2019-06-01 (×3): 2 [IU] via SUBCUTANEOUS
  Administered 2019-06-01: 8 [IU] via SUBCUTANEOUS
  Administered 2019-06-02 – 2019-06-03 (×2): 3 [IU] via SUBCUTANEOUS

## 2019-05-30 MED ORDER — PRAVASTATIN SODIUM 10 MG PO TABS: 20.0000 mg | ORAL_TABLET | Freq: Every day | ORAL | Status: DC

## 2019-05-30 MED ORDER — LORATADINE 10 MG PO TABS
10.0000 mg | ORAL_TABLET | Freq: Every day | ORAL | Status: DC
  Administered 2019-05-30 – 2019-06-03 (×5): 10 mg via ORAL
  Filled 2019-05-30 (×5): qty 1

## 2019-05-30 MED ORDER — ACETAMINOPHEN 650 MG RE SUPP: 650.0000 mg | Freq: Four times a day (QID) | RECTAL | Status: DC | PRN

## 2019-05-30 MED ORDER — INSULIN ASPART 100 UNIT/ML ~~LOC~~ SOLN
3.0000 [IU] | Freq: Three times a day (TID) | SUBCUTANEOUS | Status: DC
  Administered 2019-05-31 (×3): 3 [IU] via SUBCUTANEOUS

## 2019-05-30 MED ORDER — SODIUM CHLORIDE 0.9 % IV SOLN
3.0000 g | Freq: Three times a day (TID) | INTRAVENOUS | Status: DC
  Filled 2019-05-30 (×3): qty 8

## 2019-05-30 MED ORDER — SODIUM CHLORIDE 0.9% FLUSH: 3.0000 mL | Freq: Once | INTRAVENOUS | Status: DC

## 2019-05-30 MED ORDER — METOPROLOL SUCCINATE ER 25 MG PO TB24: 25.0000 mg | ORAL_TABLET | Freq: Every day | ORAL | Status: DC

## 2019-05-30 MED ORDER — ACETAMINOPHEN 325 MG PO TABS
650.0000 mg | ORAL_TABLET | Freq: Four times a day (QID) | ORAL | Status: DC | PRN
  Administered 2019-05-31 – 2019-06-02 (×4): 650 mg via ORAL
  Filled 2019-05-30 (×4): qty 2

## 2019-05-30 MED ORDER — INSULIN GLARGINE 100 UNIT/ML ~~LOC~~ SOLN
10.0000 [IU] | Freq: Every day | SUBCUTANEOUS | Status: DC
  Administered 2019-05-31 – 2019-06-02 (×3): 10 [IU] via SUBCUTANEOUS
  Filled 2019-05-30 (×4): qty 0.1

## 2019-05-30 MED ORDER — ASPIRIN EC 81 MG PO TBEC
81.0000 mg | DELAYED_RELEASE_TABLET | Freq: Every day | ORAL | Status: DC
  Administered 2019-05-30 – 2019-06-03 (×5): 81 mg via ORAL
  Filled 2019-05-30 (×5): qty 1

## 2019-05-30 MED ORDER — AZITHROMYCIN 250 MG PO TABS
500.0000 mg | ORAL_TABLET | Freq: Once | ORAL | Status: AC
  Administered 2019-05-31: 500 mg via ORAL
  Filled 2019-05-30: qty 2

## 2019-05-30 NOTE — ED Triage Notes (Signed)
Pt arrives from home wit c/o of cp body aches and chills since Sunday. Pt states she did have a temp of 101 on Sunday. No known exposure to covid.

## 2019-05-30 NOTE — ED Notes (Signed)
Patient transported to CT 

## 2019-05-30 NOTE — H&P (Addendum)
Date: 05/30/2019               Patient Name:  Donna Fuller MRN: [id number]  DOB: 08-08-1941 Age / Sex: 78 y.o., female   PCP: Sandi Mariscal, MD              Medical Service: Internal Medicine Teaching Service              Attending Physician: Dr. Bartholomew Crews, MD    First Contact: Mercy Moore, MS3 Pager: [phone]  Second Contact: Dr. Frederico Hamman Pager: [phone]  Third Contact Dr.  Karalee Height: [phone]-       After Hours (After 5p/  First Contact Pager: [phone]  weekends / holidays): Second Contact Pager: [phone]   Chief Complaint: sternal chest pain  History of Present Illness: Donna Fuller is a 78 y.o. woman with PMHx significant for HTN, HLD, DM, GERD, obesity and breast cancer presenting with complaint of intermittent sternal chest pain for about one month. Patient reports worsening progression with increased intensity since onset. Patient describes predictable pattern of nonradiating, sharp, stabbing sternal chest pain that comes on when performing chores around the house; patient denies onset at rest. Patient endorses multiple daily episodes of 15 minute duration, relieved by rest. No treatment at home. Associated symptoms include dyspnea, diaphoresis and nausea. Patient reports history of similar chest pain one year ago with spontaneous resolution. Patient has a history of GERD, but states the quality of her present chest pain is different. Patient denies association with meals. Patient denies personal cardiac history, but reports family history of "heart problems" in her father and brother.   Patient reports that her ED visit was prompted by her chest pain, but secondarily reports onset of dry cough, fever (max 101F), chills, nausea, fatigue, decreased appetite, and change in taste that began two days ago. No treatment at home. Patient lives with her husband and daughter, no one at home with similar symtpoms. She denies known COVID exposure or other sick contacts.   Patient states that  she checks her blood glucose and blood pressure at home. Reports that her blood glucose has been "running high" around 300 and her blood pressure readings are ~200/100.   Upon presentation to ED, patient was hypertensive (156/64), had a mildly elevated temperature (99.66F) with benign physical exam. Patient received EKG, high sensitivity troponin, CXR, CBC, BMP, and COVID testing.  EKG: negative  CXR negative  elevated troponin 66, repeat 73  hyperglycemia, 187  leukocytosis 17.8  COVID negative   Meds:  Current Meds  Medication Sig  . acetaminophen (TYLENOL) 650 MG CR tablet Take 650 mg by mouth daily.  Marland Kitchen allopurinol (ZYLOPRIM) 300 MG tablet TAKE 1 TABLET BY MOUTH ONCE DAILY (Patient taking differently: Take 300 mg by mouth daily. )  . aspirin 81 MG tablet Take 81 mg by mouth daily.  . brimonidine (ALPHAGAN) 0.2 % ophthalmic solution Place 1 drop into both eyes 2 (two) times a day.  . Cholecalciferol (VITAMIN D-3) 5000 units TABS Take 1 tablet by mouth daily.  . cyclobenzaprine (FLEXERIL) 10 MG tablet Take 1 tablet (10 mg total) by mouth at bedtime.  . ferrous sulfate 325 (65 FE) MG tablet Take 325 mg by mouth daily with breakfast.  . gabapentin (NEURONTIN) 300 MG capsule TAKE 1 CAPSULE BY MOUTH THREE TIMES DAILY ,  MAY  TAKE  AN  ADDITIONAL  CAPSULE  IN  THE  EVENING  . lisinopril-hydrochlorothiazide (ZESTORETIC) 20-25 MG tablet Take 1 tablet by mouth  daily.  . Magnesium 250 MG TABS Take 1 tablet by mouth daily.  Marland Kitchen omeprazole (PRILOSEC) 40 MG capsule Take 1 capsule (40 mg total) by mouth daily. Must keep appt on 12/06/16 with Dr. Caryl Bis  . pravastatin (PRAVACHOL) 20 MG tablet TAKE 1 TABLET BY MOUTH ONCE DAILY  . timolol (TIMOPTIC) 0.5 % ophthalmic solution Place 1 drop into both eyes 2 (two) times a day.  . TRESIBA FLEXTOUCH 200 UNIT/ML SOPN Inject 60 Units into the skin daily.     Allergies: Allergies as of 05/30/2019 - Review Complete 05/30/2019  Allergen Reaction Noted  .  Sulfa antibiotics Swelling 12/13/2013  . Amlodipine Swelling 05/20/2017  . Lisinopril Cough 05/20/2017   Past Medical History:  Diagnosis Date  . Arthritis   . Breast cancer [facility]) 2007   Right breast, s/p mastectomy  . Diabetes mellitus without complication ([place])   . GERD (gastroesophageal reflux disease)   . Gout   . Hyperlipidemia   . Hypertension   . Spinal stenosis     Family History:   Social History: Patient lives at home with her husband and daughter. Retired. IALDs: patient reports managing her medications and paying bills   Review of Systems: Review of Systems  Constitutional: Positive for appetite change, chills, fatigue and fever.  Respiratory: Positive for cough.   Cardiovascular: Positive for chest pain. Negative for leg swelling.  Gastrointestinal: Positive for nausea. Negative for vomiting.  Genitourinary: Negative for dysuria.  Musculoskeletal: Positive for back pain.  Neurological: Positive for headaches.    Physical Exam: Blood pressure (!) 148/61, pulse 93, temperature (!) 103.1 F (39.5 C), temperature source Oral, resp. rate 20, SpO2 94 %.  Physical Exam  Constitutional: Patient resting in bed, engages with team, appears fatigued during history and examination  HENT:  Head: Normocephalic and atraumatic.  Eyes: EOM intact.  Cardiovascular: Normal rate, regular rhythm, 2/6 systolic murmur at LSB  Respiratory: Effort normal. Shallow breathing with decreased breath sounds normal. Mild Crackle at rigth base. No respiratory distress. No wheezes.  GI: Soft. Full. Normal bowel sounds. No distension. No tenderness to palpation.  Musculoskeletal: No lower extremity tenderness, erythema, or  edema.  Neurological: Is alert. Oriented to person and time.  Skin: Warm. Not diaphoretic.   Psychiatric: Normal mood and affect. Behavior is normal. Judgment and thought content normal.    EKG: personally reviewed my interpretation is sinus rhythm, regular rate, no ST  abnormalities  CXR: personally reviewed my interpretation is left cardiac border and costophrenic angle mildly obscured with left lower opacity   Assessment & Plan by Problem: Principal Problem:   Pleuritic chest pain Active Problems:   Fever  ETSUKO DIEROLF is 78 y.o. with PMHx significant for HTN, HLD, DM, GERD, obesity and breast cancer who presents complaining of chest pain. Patient presents an inconsistent history, but reports intermittent sharp, stabbing, sternal chest pain ongoing for one month associated with exertion. More recently patient reports onset of cough, fever, chills, fatigue, decreased appetite and change in taste two days ago.    1 Chest pain Patient's history does not distinctively identify pleuritic vs typical or atypical anginal chest pain. Patient is without personal cardiac history, but has several ASCVD risk factors: HTN, DM, HLD, obesity. And given that she is >58 years of age, female, and diabetic she is more likely to present with atypical features of ACS. Patient's EKG is negative, but she has elevated cardiac markers: troponin 73. Patient's chest pain could be cardiac in nature. However her systemic  features of fever and leukocytosis also make an infectious etiology likely. Patient has negative COVID testing and CXR does not reveal frank pneumonia or pulmonary process. Given patient's constellation of symptoms COVID cannot be ruled out. Plan for further cardiac workup and evaluation for infection source.  -cardiac monitoring -serial troponin  -chest CT --> consistent with PNA -echocardiogram to evaluate heart murmur -respiratory viral pathogen panel -UA -blood culture -Unasyn per pharmacy -Azithromycin 500 mg QD then 250 mg QD x4 days -ASA 81 mg QD -RVP  2 Hypertension  Patient was initially hypertensive on arrival with report of elevated blood pressures at home. Patient with an acute decrease at 78/59.   -hold antihypertensives  3 Diabetes Patient  hyperglycemic upon arrival, 187. She reports that her blood glucose at home has been "running high".   -A1c -lantus 10 units at bed time -novolog 3 u tid with meals -Sliding scale  4 Hyperlipidemia   -lipid panel  -pravastatin    FEN/GI: heart healthy diet Code: Full  Dispo: Admit patient to Observation with expected length of stay less than 2 midnights.  Signed: Mercy Moore, Medical Student 05/30/2019, 5:59 PM  Pager: [phone]

## 2019-05-30 NOTE — Progress Notes (Signed)
Pharmacy Antibiotic Note  Donna Fuller is a 78 y.o. female admitted on 05/30/2019 with CP and cough, with concern for aspiration PNA .  Pharmacy has been consulted for Unasyn dosing.  SCr 1.35  Plan: Unasyn 3g IV every 8 hours Monitor renal function, clinical progression and LOT     Temp (24hrs), Avg:101.3 F (38.5 C), Min:99.5 F (37.5 C), Max:103.1 F (39.5 C)  Recent Labs  Lab 05/30/19 1117  WBC 17.8*  CREATININE 1.35*    CrCl cannot be calculated (Unknown ideal weight.).    Allergies  Allergen Reactions  . Sulfa Antibiotics Swelling  . Amlodipine Swelling    LE Swelling  . Lisinopril Cough    Bertis Ruddy, PharmD Clinical Pharmacist Please check AMION for all [place] numbers 05/30/2019 8:43 PM

## 2019-05-30 NOTE — ED Provider Notes (Addendum)
[facility] EMERGENCY DEPARTMENT Provider Note   CSN: [id number] Arrival date & time: 05/30/19  1050    History   Chief Complaint Chief Complaint  Patient presents with  . Chest Pain    HPI Donna Fuller is a 78 y.o. female.     Central chest pain on Sunday with associated cough, fever, chills, dyspnea.  No known COVID exposure or cardiac event.  Past medical history includes breast cancer, diabetes, hyperlipidemia, hypertension, obesity.  Pain is not associated with any activity.  Severity is mild to moderate.     Past Medical History:  Diagnosis Date  . Arthritis   . Breast cancer [facility]) 2007   Right breast, s/p mastectomy  . Diabetes mellitus without complication ([place])   . GERD (gastroesophageal reflux disease)   . Gout   . Hyperlipidemia   . Hypertension   . Spinal stenosis     Patient Active Problem List   Diagnosis Date Noted  . Yeast vaginitis 07/19/2017  . Urinary frequency 07/19/2017  . CKD (chronic kidney disease) stage 3, GFR 30-59 ml/min (HCC) 05/20/2017  . Gout 05/20/2017  . Hyperlipidemia 09/26/2016  . Spinal stenosis 09/26/2016  . History of breast cancer 09/26/2016  . Diabetes mellitus type 2, controlled ([place]) 09/26/2016  . GERD (gastroesophageal reflux disease) 09/26/2016  . Chronic low back pain 03/13/2015  . Neuropathy 03/13/2015  . Hypertension     Past Surgical History:  Procedure Laterality Date  . ABDOMINAL HYSTERECTOMY    . BREAST BIOPSY Left    2016 benign   . BREAST LUMPECTOMY    . KNEE ARTHROSCOPY    . MASTECTOMY     right side  . TUBAL LIGATION       OB History   No obstetric history on file.      Home Medications    Prior to Admission medications   Medication Sig Start Date End Date Taking? Authorizing Provider  acetaminophen (TYLENOL) 650 MG CR tablet Take 650 mg by mouth daily.    [provider]  allopurinol (ZYLOPRIM) 300 MG tablet TAKE 1 TABLET BY MOUTH ONCE DAILY 05/03/17    Debbrah Alar, NP  aspirin 81 MG tablet Take 81 mg by mouth daily.    [provider]  blood glucose meter kit and supplies KIT Dispense based on patient and insurance preference. Use up to four times daily as directed. (FOR ICD-9 250.00, 250.01). 07/09/17   Coral Spikes, DO  Cholecalciferol (VITAMIN D-3) 5000 units TABS Take 1 tablet by mouth daily.    [provider]  cyanocobalamin 1000 MCG tablet Take 1,000 mcg by mouth daily.    [provider]  cyclobenzaprine (FLEXERIL) 10 MG tablet Take 1 tablet (10 mg total) by mouth at bedtime. 02/09/14   Jonathon Resides, MD  ferrous sulfate 325 (65 FE) MG tablet Take 325 mg by mouth daily with breakfast.    [provider]  fluconazole (DIFLUCAN) 150 MG tablet 1 tablet once. Repeat dosing in 72 hours. 07/19/17   Coral Spikes, DO  gabapentin (NEURONTIN) 300 MG capsule TAKE 1 CAPSULE BY MOUTH THREE TIMES DAILY ,  MAY  TAKE  AN  ADDITIONAL  CAPSULE  IN  THE  EVENING 11/20/17   Leone Haven, MD  glucose blood (ONE TOUCH ULTRA TEST) test strip USE AS DIRECTED TO CHECK BLOOD SUGAR 2-3 TIMES A DAY Pt will need an OV for more refills 08/16/18   Sandi Mariscal, MD  insulin NPH-regular  Human (NOVOLIN 70/30) (70-30) 100 UNIT/ML injection Take 52 units every morning and 52 units every night. 04/21/17   Debbrah Alar, NP  loratadine (CLARITIN) 10 MG tablet Take 10 mg by mouth daily.    [provider]  losartan-hydrochlorothiazide (HYZAAR) 50-12.5 MG tablet Take 1 tablet by mouth daily. 05/20/17   Coral Spikes, DO  Magnesium 250 MG TABS Take 1 tablet by mouth daily.    [provider]  metoprolol succinate (TOPROL-XL) 50 MG 24 hr tablet Take 1 tablet (50 mg total) by mouth daily. Take with or immediately following a meal. 04/06/17   Debbrah Alar, NP  NOVOLIN 70/30 RELION (70-30) 100 UNIT/ML injection INJECT 45 UNITS SUBCUTANEOUSLY IN THE MORNING AND AT NIGHT 09/06/17   Debbrah Alar, NP   omeprazole (PRILOSEC) 40 MG capsule Take 1 capsule (40 mg total) by mouth daily. Must keep appt on 12/06/16 with Dr. Caryl Bis 11/11/17   Leone Haven, MD  [facility] DELICA LANCETS 00F MISC Use as instructed to check blood sugar 2-3 times a day.  DX E11.9 09/30/16   Debbrah Alar, NP  pravastatin (PRAVACHOL) 20 MG tablet TAKE 1 TABLET BY MOUTH ONCE DAILY 09/27/17   Leone Haven, MD  vitamin C (ASCORBIC ACID) 500 MG tablet Take 500 mg by mouth daily.    [provider]    Family History Family History  Problem Relation Age of Onset  . Diabetes Mother   . Alzheimer's disease Mother   . Heart disease Father   . Hyperlipidemia Father   . Kidney disease Father   . Cancer Brother        lung  . Alzheimer's disease Brother   . Heart disease Brother     Social History Social History   Tobacco Use  . Smoking status: Former Research scientist (life sciences)  . Smokeless tobacco: Never Used  Substance Use Topics  . Alcohol use: No  . Drug use: No     Allergies   Sulfa antibiotics, Amlodipine, and Lisinopril   Review of Systems Review of Systems  All other systems reviewed and are negative.    Physical Exam Updated Vital Signs BP (!) 156/64   Pulse 94   Temp 99.5 F (37.5 C) (Oral)   Resp 20   SpO2 98%   Physical Exam Vitals signs and nursing note reviewed.  Constitutional:      Appearance: She is well-developed.     Comments: nad  HENT:     Head: Normocephalic and atraumatic.  Eyes:     Conjunctiva/sclera: Conjunctivae normal.  Neck:     Musculoskeletal: Neck supple.  Cardiovascular:     Rate and Rhythm: Normal rate and regular rhythm.  Pulmonary:     Effort: Pulmonary effort is normal.     Breath sounds: Normal breath sounds.  Abdominal:     General: Bowel sounds are normal.     Palpations: Abdomen is soft.  Musculoskeletal: Normal range of motion.  Skin:    General: Skin is warm and dry.  Neurological:     Mental Status: She is alert and oriented to  person, place, and time.  Psychiatric:        Behavior: Behavior normal.      ED Treatments / Results  Labs (all labs ordered are listed, but only abnormal results are displayed) Labs Reviewed  BASIC METABOLIC PANEL - Abnormal; Notable for the following components:      Result Value   Glucose, Bld 187 (*)    Creatinine, Ser 1.35 (*)  GFR calc non Af Amer 38 (*)    GFR calc Af Amer 44 (*)    All other components within normal limits  CBC - Abnormal; Notable for the following components:   WBC 17.8 (*)    All other components within normal limits  TROPONIN I (HIGH SENSITIVITY) - Abnormal; Notable for the following components:   Troponin I (High Sensitivity) 66 (*)    All other components within normal limits  TROPONIN I (HIGH SENSITIVITY) - Abnormal; Notable for the following components:   Troponin I (High Sensitivity) 73 (*)    All other components within normal limits  SARS CORONAVIRUS 2 (HOSPITAL ORDER, [place])    EKG EKG Interpretation  Date/Time:  Tuesday May 30 2019 10:56:40 EDT Ventricular Rate:  91 PR Interval:  168 QRS Duration: 74 QT Interval:  342 QTC Calculation: 420 R Axis:   85 Text Interpretation:  Normal sinus rhythm with sinus arrhythmia Possible Left atrial enlargement Borderline ECG Confirmed by Nat Christen [id number]) on 05/30/2019 11:54:55 AM   Radiology Dg Chest Portable 1 View  Result Date: 05/30/2019 CLINICAL DATA:  Chest pain and chills EXAM: PORTABLE CHEST 1 VIEW COMPARISON:  05/11/2017 FINDINGS: Cardiac shadow is stable. Aortic calcifications are again seen. The lungs are well aerated bilaterally. Minimal left basilar atelectasis is noted new from the prior exam. No bony abnormality is noted. IMPRESSION: Mild left basilar atelectasis. Electronically Signed   By: Inez Catalina M.D.   On: 05/30/2019 12:11    Procedures Procedures (including critical care time)  Medications Ordered in ED Medications  sodium  chloride flush (NS) 0.9 % injection 3 mL (has no administration in time range)     Initial Impression / Assessment and Plan / ED Course  I have reviewed the triage vital signs and the nursing notes.  Pertinent labs & imaging results that were available during my care of the patient were reviewed by me and considered in my medical decision making (see chart for details).        Patient is hemodynamically stable.  Will obtain typical cardiac work-up and COVID-19 test.  Patient with multiple cardiovascular risk factors presents with chest pain and cough.  Troponin minimally elevated x2.  COVID test negative.  Will admit for observation.  Final Clinical Impressions(s) / ED Diagnoses   Final diagnoses:  Chest pain, unspecified type  Elevated troponin    ED Discharge Orders    None       Nat Christen, MD 05/30/19 1137    Nat Christen, MD 05/30/19 [phone]

## 2019-05-30 NOTE — ED Notes (Signed)
Admitting Providers at bedside. 

## 2019-05-30 NOTE — ED Notes (Signed)
Perley (Husband)

## 2019-05-30 NOTE — ED Notes (Signed)
RN found patient sitting at the end of her stretcher removing herself from monitor stating she does not want to stay any longer. MD notified.

## 2019-05-31 ENCOUNTER — Inpatient Hospital Stay (HOSPITAL_COMMUNITY): Payer: PPO

## 2019-05-31 ENCOUNTER — Encounter (HOSPITAL_COMMUNITY): Payer: Self-pay

## 2019-05-31 DIAGNOSIS — Z853 Personal history of malignant neoplasm of breast: Secondary | ICD-10-CM

## 2019-05-31 DIAGNOSIS — J181 Lobar pneumonia, unspecified organism: Secondary | ICD-10-CM

## 2019-05-31 DIAGNOSIS — R1312 Dysphagia, oropharyngeal phase: Secondary | ICD-10-CM

## 2019-05-31 DIAGNOSIS — R937 Abnormal findings on diagnostic imaging of other parts of musculoskeletal system: Secondary | ICD-10-CM

## 2019-05-31 DIAGNOSIS — R7989 Other specified abnormal findings of blood chemistry: Secondary | ICD-10-CM

## 2019-05-31 DIAGNOSIS — Z880 Allergy status to penicillin: Secondary | ICD-10-CM

## 2019-05-31 DIAGNOSIS — R011 Cardiac murmur, unspecified: Secondary | ICD-10-CM

## 2019-05-31 LAB — URINALYSIS, ROUTINE W REFLEX MICROSCOPIC
Bacteria, UA: NONE SEEN
Bilirubin Urine: NEGATIVE
Glucose, UA: NEGATIVE mg/dL
Ketones, ur: 5 mg/dL — AB
Leukocytes,Ua: NEGATIVE
Nitrite: NEGATIVE
Protein, ur: 100 mg/dL — AB
Specific Gravity, Urine: 1.023 (ref 1.005–1.030)
pH: 5 (ref 5.0–8.0)

## 2019-05-31 LAB — RESPIRATORY PANEL BY PCR

## 2019-05-31 LAB — COMPREHENSIVE METABOLIC PANEL
ALT: 15 U/L (ref 0–44)
AST: 24 U/L (ref 15–41)
Albumin: 3 g/dL — ABNORMAL LOW (ref 3.5–5.0)
Alkaline Phosphatase: 97 U/L (ref 38–126)
Anion gap: 14 (ref 5–15)
BUN: 19 mg/dL (ref 8–23)
CO2: 24 mmol/L (ref 22–32)
Calcium: 8.9 mg/dL (ref 8.9–10.3)
Chloride: 101 mmol/L (ref 98–111)
Creatinine, Ser: 1.28 mg/dL — ABNORMAL HIGH (ref 0.44–1.00)
GFR calc Af Amer: 47 mL/min — ABNORMAL LOW (ref >60)
GFR calc non Af Amer: 40 mL/min — ABNORMAL LOW (ref >60)
Glucose, Bld: 130 mg/dL — ABNORMAL HIGH (ref 70–99)
Potassium: 3.7 mmol/L (ref 3.5–5.1)
Sodium: 139 mmol/L (ref 135–145)
Total Bilirubin: 1.1 mg/dL (ref 0.3–1.2)
Total Protein: 7.1 g/dL (ref 6.5–8.1)

## 2019-05-31 LAB — LIPID PANEL
Cholesterol: 140 mg/dL (ref 0–200)
HDL: 42 mg/dL (ref >40)
LDL Cholesterol: 77 mg/dL (ref 0–99)
Total CHOL/HDL Ratio: 3.3 RATIO
Triglycerides: 105 mg/dL (ref <150)
VLDL: 21 mg/dL (ref 0–40)

## 2019-05-31 LAB — CBC
HCT: 37.7 % (ref 36.0–46.0)
Hemoglobin: 12.2 g/dL (ref 12.0–15.0)
MCH: 28.5 pg (ref 26.0–34.0)
MCHC: 32.4 g/dL (ref 30.0–36.0)
MCV: 88.1 fL (ref 80.0–100.0)
Platelets: 184 10*3/uL (ref 150–400)
RBC: 4.28 MIL/uL (ref 3.87–5.11)
RDW: 14.7 % (ref 11.5–15.5)
WBC: 18.8 10*3/uL — ABNORMAL HIGH (ref 4.0–10.5)
nRBC: 0 % (ref 0.0–0.2)

## 2019-05-31 LAB — ECHOCARDIOGRAM COMPLETE
Height: 64 in
Weight: 3220.48 oz

## 2019-05-31 LAB — TROPONIN I (HIGH SENSITIVITY)
Troponin I (High Sensitivity): 204 ng/L (ref <18)
Troponin I (High Sensitivity): 183 ng/L (ref <18)

## 2019-05-31 LAB — GLUCOSE, CAPILLARY
Glucose-Capillary: 130 mg/dL — ABNORMAL HIGH (ref 70–99)
Glucose-Capillary: 109 mg/dL — ABNORMAL HIGH (ref 70–99)
Glucose-Capillary: 127 mg/dL — ABNORMAL HIGH (ref 70–99)
Glucose-Capillary: 94 mg/dL (ref 70–99)

## 2019-05-31 LAB — CBG MONITORING, ED: Glucose-Capillary: 110 mg/dL — ABNORMAL HIGH (ref 70–99)

## 2019-05-31 LAB — MAGNESIUM: Magnesium: 1.9 mg/dL (ref 1.7–2.4)

## 2019-05-31 MED ORDER — SODIUM CHLORIDE 0.9 % IV SOLN
1.0000 g | Freq: Once | INTRAVENOUS | Status: AC
  Administered 2019-05-31: 1 g via INTRAVENOUS
  Filled 2019-05-31: qty 10

## 2019-05-31 MED ORDER — LATANOPROST 0.005 % OP SOLN
1.0000 [drp] | Freq: Every day | OPHTHALMIC | Status: DC
  Administered 2019-05-31 – 2019-06-03 (×4): 1 [drp] via OPHTHALMIC
  Filled 2019-05-31: qty 2.5

## 2019-05-31 MED ORDER — TIMOLOL MALEATE 0.5 % OP SOLN
1.0000 [drp] | Freq: Two times a day (BID) | OPHTHALMIC | Status: DC
  Administered 2019-05-31 – 2019-06-03 (×6): 1 [drp] via OPHTHALMIC
  Filled 2019-05-31: qty 5

## 2019-05-31 MED ORDER — SODIUM CHLORIDE 0.9 % IV SOLN
1.0000 g | INTRAVENOUS | Status: DC
  Administered 2019-05-31 – 2019-06-02 (×3): 1 g via INTRAVENOUS
  Filled 2019-05-31: qty 1
  Filled 2019-05-31 (×2): qty 10
  Filled 2019-05-31: qty 1

## 2019-05-31 MED ORDER — ENSURE ENLIVE PO LIQD
237.0000 mL | Freq: Two times a day (BID) | ORAL | Status: DC
  Administered 2019-05-31 – 2019-06-01 (×2): 237 mL via ORAL

## 2019-05-31 NOTE — ED Notes (Signed)
Per [facility], 2W is not accepting patient until 0530.

## 2019-05-31 NOTE — Progress Notes (Addendum)
Date: 05/31/2019  Patient name: Donna Fuller  Medical record number: [id number]  Date of birth: November 25, 1941   I have seen and evaluated Donna Fuller and discussed their care with the Residency Team. Donna Fuller is a 78 year old woman with history of breast cancer who presented with chief complaint of body aches, chills, and a fever on 28 June.  On admission, she had described 1 month of intermittent sternal chest pain that was worsening and was nonradiating, sharp, and stabbing.  It was exacerbated by doing chores around the house and relieved by resting.  Other symptoms included dyspnea, diaphoresis, and nausea.  In the ED, the admitting team got a history of a new dry cough, fever, chills, nausea, fatigue, anorexia, and a change in taste all starting 2 days prior to her admission.  Today, she states that she has not been out in public.her family is doing all of the shopping and that she has had no known exposure to [place].  Today, she states that she is feeling worse in regards to the chest pain that comes on when she coughs or tries to breathe deeply. She was relieved to know her first co-pay test was negative.  She has remained febrile since admission with tachypnea.  She also gives additional history of choking and coughing after she eats solid food.  This occurs about every other day.  She also gets solid food stuck in her upper throat on the right side and this is been going on for years.  Liquids do not pose a challenge for her.  She states she has been told this is due to her heartburn but it is not under control.  Vitals:   05/31/19 0600 05/31/19 0754  BP: (!) 152/55 (!) 137/56  Pulse: 89 98  Resp: (!) 31 (!) 30  Temp:  99.5 F (37.5 C)  SpO2: 99% 99%   Physical Exam Constitutional:      General: She is not in acute distress.    Appearance: She is well-developed. She is obese. She is not ill-appearing, toxic-appearing or diaphoretic.  HENT:     Head: Normocephalic and  atraumatic.     Right Ear: External ear normal.     Left Ear: External ear normal.     Nose: Nose normal. No congestion or rhinorrhea.  Eyes:     General: No scleral icterus.       Right eye: No discharge.        Left eye: No discharge.     Extraocular Movements: Extraocular movements intact.  Cardiovascular:     Rate and Rhythm: Normal rate and regular rhythm.     Heart sounds: No murmur.  Pulmonary:     Effort: Pulmonary effort is normal.     Breath sounds: Normal breath sounds.     Comments: Able to speak in full sentences without respiratory distress.  Exam was in a negative pressure room with an isolation stethoscope so auscultation quality was extremely poor. Abdominal:     General: Bowel sounds are normal.     Palpations: Abdomen is soft.  Musculoskeletal:        General: No deformity or signs of injury.  Skin:    General: Skin is warm and dry.  Neurological:     General: No focal deficit present.     Mental Status: She is alert.  Psychiatric:        Mood and Affect: Mood normal.        Behavior: Behavior normal.  Thought Content: Thought content normal.        Judgment: Judgment normal.   Relevant labs Creatinine 1.28, baseline Albumin 3.0 Troponin peaked at 204 Total cholesterol 140, LDL 77, HDL 42 WBC 18.8 RVP negative Rapid in-house coronavirus negative Send out coronavirus pending  I personally viewed the CXR images and confirmed my reading with the official read. rotated, over penetrated. no sig abnl  I personally viewed the EKG and confirmed my reading with the official read.:  Sinus rhythm, normal axis, no ischemic changes  Assessment and Plan: I have seen and evaluated the patient as outlined above. I agree with the formulated Assessment and Plan as detailed in the residents' note, with the following changes: Donna Fuller is a 78 year old woman with history of breast cancer who presented with chest pain who ruled out for ACS with her EKGs.  Her  troponin was significantly elevated but likely due to a non-ACS etiology.  The admitting team.additional history of fever, cough, and a change in taste.  Her initial coronavirus PCR was negative but due to her symptoms, a send out test was ordered.  Following that, her CT revealed a right lower lobe pneumonia and additional history obtained today, reveals that she is having coughing and choking after solid food and oropharyngeal dysphasia about every other day.  This makes a bacterial community-acquired pneumonia, possibly aspiration, much more likely than a coronavirus infection.  Due to a penicillin allergy, she has been started on Rocephin and azithromycin.  If her fever does not resolve, we will need to substitute metronidazole for the azithromycin.  Due to the frequent symptoms of coughing, choking, and oropharyngeal dysphagia, we will ask speech therapy as a videofluoroscopy is appropriate to assess her symptoms.  1.  Right lower lobe community-acquired pneumonia, possible aspiration -she remains febrile to 103, tachypneic, and requiring supplemental oxygen.  Follow fever curve and leukocytosis trend.  Currently only requiring 2 L of O2 by nasal cannula.  Antibiotics with Rocephin and azithromycin but plan to substitute metronidazole clinic azithromycin if her clinical course does not improve.  2.  Elevated troponin -not due to ACS but likely due to her pneumonia.  3.  Oropharyngeal dysphagia (new dx today), coughing, choking with solid intake -speech therapy for consideration of videofluoroscopy.  4.  Abnormal osseous structures on CT -differential diagnosis per radiology included anemia, smoking, obesity, metabolic bone disease, myeloma, breast cancer metastasis.  We will need to further investigate this and will discuss differential with radiology.  Bartholomew Crews, MD 7/1/20201:23 PM

## 2019-05-31 NOTE — Progress Notes (Signed)
   Subjective: Per nurse's note, patient with episode of delirium this morning. Patient was found walking naked in the hallway. She had soiled her bed and room floor with loose bowel movement. Patient was assisted to the bathroom, on return to her bed she desaturated to 89%. Placed on 2L [place]. Patient reports unchanged pain associated with breathing and coughing. Patient reports history of frequent choking and coughing at every day or every other day.   Objective:  Vital signs in last 24 hours: Vitals:   05/31/19 0501 05/31/19 0515 05/31/19 0600 05/31/19 0754  BP: (!) 164/61  (!) 152/55 (!) 137/56  Pulse: (!) 104 94 89 98  Resp: (!) 31 (!) 34 (!) 31 (!) 30  Temp:    99.5 F (37.5 C)  TempSrc:    Oral  SpO2: 92% 100% 99% 99%  Weight:    91.3 kg  Height:    5\' 4"  (1.626 m)    Physical Exam  Constitutional: She is oriented to person, place, and time. She appears well-developed and well-nourished.  Cardiovascular: Normal rate, regular rhythm and intact distal pulses.  No murmur heard. Pulmonary/Chest:  Dry cough, unable to appreciate any breath sounds secondary to ambient noise  Abdominal: Soft. She exhibits no distension.  Neurological: She is alert and oriented to person, place, and time.  Skin: Skin is warm and dry.     Weight change:   Intake/Output Summary (Last 24 hours) at 05/31/2019 1511 Last data filed at 05/31/2019 0250 Gross per 24 hour  Intake 100.82 ml  Output -  Net 100.82 ml     Assessment/Plan:  Principal Problem:   Pleuritic chest pain Active Problems:   Fever  Donna Fuller is a 78 y.o. woman with PMHx significant for HTN, HLD, DM, GERD, obesity and breast cancer status post right mastectomy who presented to ED one day ago complaining of sternal chest pain with exertion ongoing for one month and cough, fever, chills, fatigue, myalgias, decreased appetite, and change in taste for two days.    1 Pneumonia  Chest CT 05/28/19 revealed RLL pneumonia,  concerning for aspiration event. Patient was started on empiric treatment for CAP and aspiration pneumonia. Patient does not have history of neurological deficits, but she is elderly, has a PMHx of GERD, and describes episodes of choking with eating. Patient remains febrile, intermittently tachycardic, and hypertensive. Since her desaturation this morning, patient remains on 2L [place]. Will continue antibiotics given that patient's clinical picture has not improved.  -ceftriaxone -azithromycin -SLP swallowing study to evaluate oropharyngeal dysphagia -follow up blood cultures   2 Chest pain Initial concern for ACS given patient's complaint of sternal chest pain associated with exertion + ASCVD risk factors. Patient's follow up EKG was negative for any dynamic changes. Patient was found to have new systolic murmur on physical exam evaluated with echo. 05/31/19 echocardiogram revealed diastolic ventricular dysfunction with preserved ejection fraction of 60-65%. Patient's chest pain is most likely pleuritic in nature and her elevated troponin level indicative of her illness severity. No further workup needed.        LOS: 1 day   Mercy Moore, Medical Student 05/31/2019, 3:11 PM

## 2019-05-31 NOTE — Plan of Care (Signed)

## 2019-05-31 NOTE — Evaluation (Signed)
Physical Therapy Evaluation Patient Details Name: Donna Fuller MRN: [id number] DOB: [birth year] Today's Date: 05/31/2019   History of Present Illness  78 y.o. female admitted on 05/30/19 for sternal chest pain.  COVID testing (-).  Dx with PNA (RLL), chest pain (concern for ACS, but EKG was negative), thought to be pleuriritic in nature.  Pt with significant PMH of spinal stenosis, HTN, gout, DM, breast CA R breast s/p mastectomy, arthritis s/p L TKA.    Clinical Impression  Pt is able to mobilize with min guard assist around the room on RA maintaining sats at 90% during mobility.  Rebounded to 94% with seated rest.  I left her on RA up in the recliner chair.  She reports increased difficulty due to weakness with preforming her ADLs, so I have requested and OT consult.  PT will continue to follow acutely for safe mobility progression    Follow Up Recommendations No PT follow up;Supervision for mobility/OOB    Equipment Recommendations  3in1 (PT);Other (comment)(for use in the shower at home)    Recommendations for Other Services OT consult     Precautions / Restrictions Precautions Precautions: Other (comment) Precaution Comments: monitor and wean O2      Mobility  Bed Mobility Overal bed mobility: Modified Independent                Transfers Overall transfer level: Needs assistance Equipment used: 1 person hand held assist Transfers: Sit to/from Stand Sit to Stand: Min guard         General transfer comment: Min guard assist for safety  Ambulation/Gait Ambulation/Gait assistance: Min guard Gait Distance (Feet): 20 Feet Assistive device: 1 person hand held assist Gait Pattern/deviations: Step-through pattern(mildly stiff legged gait pattern, improved with distance)            Balance Overall balance assessment: Needs assistance Sitting-balance support: Feet supported;No upper extremity supported Sitting balance-Leahy Scale: Good     Standing balance  support: No upper extremity supported Standing balance-Leahy Scale: Fair                               Pertinent Vitals/Pain Pain Assessment: 0-10 Pain Score: 4  Pain Location: sternal chest pain, RN made aware Pain Descriptors / Indicators: Burning Pain Intervention(s): Limited activity within patient's tolerance;Monitored during session;Repositioned    Home Living Family/patient expects to be discharged to:: Private residence Living Arrangements: Spouse/significant other;Children(husband and daughter) Available Help at Discharge: Family;Available 24 hours/day Type of Home: House Home Access: Level entry     Home Layout: Two level;Able to live on main level with bedroom/bathroom;Full bath on main level Home Equipment: Walker - 2 wheels;Cane - single point      Prior Function Level of Independence: Independent         Comments: does not drive or work, likes to read and walk     Hand Dominance   Dominant Hand: Right    Extremity/Trunk Assessment   Upper Extremity Assessment Upper Extremity Assessment: Generalized weakness    Lower Extremity Assessment Lower Extremity Assessment: Generalized weakness    Cervical / Trunk Assessment Cervical / Trunk Assessment: Other exceptions Cervical / Trunk Exceptions: h/o spinal stenosis  Communication   Communication: No difficulties  Cognition Arousal/Alertness: Awake/alert Behavior During Therapy: WFL for tasks assessed/performed Overall Cognitive Status: Within Functional Limits for tasks assessed  General Comments General comments (skin integrity, edema, etc.): O2 sats decreased to 90 during mobility on RA and rebounded in < 45mins to 94 with seated rest break.  She was on 1 L when I came in, so I left her on RA and informed her RN.         Assessment/Plan    PT Assessment Patient needs continued PT services  PT Problem List Decreased  strength;Decreased activity tolerance;Decreased balance;Decreased knowledge of use of DME;Cardiopulmonary status limiting activity;Pain       PT Treatment Interventions DME instruction;Gait training;Functional mobility training;Therapeutic activities;Therapeutic exercise;Balance training;Patient/family education    PT Goals (Current goals can be found in the Care Plan section)  Acute Rehab PT Goals Patient Stated Goal: to get home soon PT Goal Formulation: With patient Time For Goal Achievement: 06/14/19 Potential to Achieve Goals: Good    Frequency Min 3X/week           AM-PAC PT "6 Clicks" Mobility  Outcome Measure Help needed turning from your back to your side while in a flat bed without using bedrails?: None Help needed moving from lying on your back to sitting on the side of a flat bed without using bedrails?: None Help needed moving to and from a bed to a chair (including a wheelchair)?: A Little Help needed standing up from a chair using your arms (e.g., wheelchair or bedside chair)?: A Little Help needed to walk in hospital room?: A Little Help needed climbing 3-5 steps with a railing? : A Little 6 Click Score: 20    End of Session   Activity Tolerance: Patient limited by fatigue Patient left: in chair;with call bell/phone within reach;with chair alarm set Nurse Communication: Mobility status PT Visit Diagnosis: Muscle weakness (generalized) (M62.81);Difficulty in walking, not elsewhere classified (R26.2)    Time: 6203-5597 PT Time Calculation (min) (ACUTE ONLY): 33 min   Charges:         Wells Guiles B. Connery Shiffler, PT, DPT  Acute Rehabilitation [phone] pager #[phone] office  @ [facility]: [phone]   PT Evaluation $PT Eval Moderate Complexity: 1 Mod PT Treatments $Therapeutic Activity: 8-22 mins       05/31/2019, 5:30 PM

## 2019-05-31 NOTE — Progress Notes (Signed)
  Echocardiogram 2D Echocardiogram has been performed.  [place] Deputy Juquan Reznick 05/31/2019, 9:49 AM

## 2019-05-31 NOTE — ED Notes (Signed)
Upon walking back from the bathroom, pt oxygen was 89% on RA, placed on 2L

## 2019-05-31 NOTE — ED Notes (Signed)
Attempted report.  RN on 2 W to speak with [facility].

## 2019-05-31 NOTE — ED Notes (Signed)
Pt walked out into hallway naked, pt had loose bowel movement all over bed floor and down both legs, cleaned pt up and assisted her to bathroom, pt had another small loose BM in the bathroom.

## 2019-06-01 ENCOUNTER — Inpatient Hospital Stay (HOSPITAL_COMMUNITY): Payer: PPO

## 2019-06-01 LAB — CBC
HCT: 36.9 % (ref 36.0–46.0)
Hemoglobin: 12.1 g/dL (ref 12.0–15.0)
MCH: 28.4 pg (ref 26.0–34.0)
MCHC: 32.8 g/dL (ref 30.0–36.0)
MCV: 86.6 fL (ref 80.0–100.0)
Platelets: 173 10*3/uL (ref 150–400)
RBC: 4.26 MIL/uL (ref 3.87–5.11)
RDW: 14.8 % (ref 11.5–15.5)
WBC: 12 10*3/uL — ABNORMAL HIGH (ref 4.0–10.5)
nRBC: 0 % (ref 0.0–0.2)

## 2019-06-01 LAB — NOVEL CORONAVIRUS, NAA (HOSP ORDER, SEND-OUT TO REF LAB; TAT 18-24 HRS): SARS-CoV-2, NAA: NOT DETECTED

## 2019-06-01 LAB — BASIC METABOLIC PANEL
Anion gap: 10 (ref 5–15)
BUN: 21 mg/dL (ref 8–23)
CO2: 27 mmol/L (ref 22–32)
Calcium: 8.7 mg/dL — ABNORMAL LOW (ref 8.9–10.3)
Chloride: 102 mmol/L (ref 98–111)
Creatinine, Ser: 1.41 mg/dL — ABNORMAL HIGH (ref 0.44–1.00)
GFR calc Af Amer: 42 mL/min — ABNORMAL LOW (ref >60)
GFR calc non Af Amer: 36 mL/min — ABNORMAL LOW (ref >60)
Glucose, Bld: 136 mg/dL — ABNORMAL HIGH (ref 70–99)
Potassium: 3.9 mmol/L (ref 3.5–5.1)
Sodium: 139 mmol/L (ref 135–145)

## 2019-06-01 LAB — HEMOGLOBIN A1C
Hgb A1c MFr Bld: 7.9 % — ABNORMAL HIGH (ref 4.8–5.6)
Mean Plasma Glucose: 180.03 mg/dL

## 2019-06-01 LAB — GLUCOSE, CAPILLARY
Glucose-Capillary: 122 mg/dL — ABNORMAL HIGH (ref 70–99)
Glucose-Capillary: 257 mg/dL — ABNORMAL HIGH (ref 70–99)
Glucose-Capillary: 118 mg/dL — ABNORMAL HIGH (ref 70–99)
Glucose-Capillary: 152 mg/dL — ABNORMAL HIGH (ref 70–99)

## 2019-06-01 MED ORDER — METRONIDAZOLE 500 MG PO TABS
500.0000 mg | ORAL_TABLET | Freq: Three times a day (TID) | ORAL | Status: DC
  Administered 2019-06-01 – 2019-06-03 (×8): 500 mg via ORAL
  Filled 2019-06-01 (×10): qty 1

## 2019-06-01 MED ORDER — LIDOCAINE 5 % EX PTCH
1.0000 | MEDICATED_PATCH | CUTANEOUS | Status: AC
  Administered 2019-06-01: 1 via TRANSDERMAL
  Filled 2019-06-01: qty 1

## 2019-06-01 MED ORDER — AZITHROMYCIN 250 MG PO TABS
250.0000 mg | ORAL_TABLET | Freq: Every day | ORAL | Status: AC
  Administered 2019-06-01 – 2019-06-03 (×3): 250 mg via ORAL
  Filled 2019-06-01 (×3): qty 1

## 2019-06-01 MED ORDER — ENOXAPARIN SODIUM 40 MG/0.4ML ~~LOC~~ SOLN
40.0000 mg | SUBCUTANEOUS | Status: DC
  Administered 2019-06-02 – 2019-06-03 (×2): 40 mg via SUBCUTANEOUS
  Filled 2019-06-01 (×2): qty 0.4

## 2019-06-01 MED ORDER — GUAIFENESIN-DM 100-10 MG/5ML PO SYRP
5.0000 mL | ORAL_SOLUTION | ORAL | Status: DC | PRN
  Administered 2019-06-01 – 2019-06-02 (×3): 5 mL via ORAL
  Filled 2019-06-01 (×3): qty 5

## 2019-06-01 NOTE — Discharge Summary (Signed)
Name: Donna Fuller MRN: [id number] DOB: [birth year] 78 y.o. PCP: Sandi Mariscal, MD  Date of Admission: 05/30/2019 10:54 AM Date of Discharge:  Attending Physician: Bartholomew Crews, MD  Discharge Diagnosis: 1. Aspiration Pneumonia   Discharge Medications: Allergies as of 06/03/2019      Reactions   Penicillins Swelling   Sulfa Antibiotics Swelling   Amlodipine Swelling   LE Swelling   Lisinopril Cough      Medication List    TAKE these medications   acetaminophen 650 MG CR tablet Commonly known as: TYLENOL Take 650 mg by mouth daily.   allopurinol 300 MG tablet Commonly known as: ZYLOPRIM TAKE 1 TABLET BY MOUTH ONCE DAILY   aspirin 81 MG tablet Take 81 mg by mouth daily.   blood glucose meter kit and supplies Kit Dispense based on patient and insurance preference. Use up to four times daily as directed. (FOR ICD-9 250.00, 250.01).   brimonidine 0.2 % ophthalmic solution Commonly known as: ALPHAGAN Place 1 drop into both eyes 2 (two) times a day.   cefdinir 300 MG capsule Commonly known as: OMNICEF Take 1 capsule (300 mg total) by mouth daily for 1 dose. Please take tonight before bed.   cyclobenzaprine 10 MG tablet Commonly known as: FLEXERIL Take 1 tablet (10 mg total) by mouth at bedtime.   ferrous sulfate 325 (65 FE) MG tablet Take 325 mg by mouth daily with breakfast.   gabapentin 300 MG capsule Commonly known as: NEURONTIN TAKE 1 CAPSULE BY MOUTH THREE TIMES DAILY ,  MAY  TAKE  AN  ADDITIONAL  CAPSULE  IN  THE  EVENING What changed: See the new instructions.   glucose blood test strip Commonly known as: ONE TOUCH ULTRA TEST USE AS DIRECTED TO CHECK BLOOD SUGAR 2-3 TIMES A DAY Pt will need an OV for more refills   guaiFENesin-dextromethorphan 100-10 MG/5ML syrup Commonly known as: ROBITUSSIN DM Take 5 mLs by mouth every 4 (four) hours as needed for cough.   ketorolac 0.5 % ophthalmic solution Commonly known as: ACULAR Place 1 drop into the  right eye 4 (four) times daily.   latanoprost 0.005 % ophthalmic solution Commonly known as: XALATAN Place 1 drop into both eyes daily.   lisinopril-hydrochlorothiazide 20-25 MG tablet Commonly known as: ZESTORETIC Take 1 tablet by mouth daily.   loperamide 2 MG capsule Commonly known as: IMODIUM Take 1 capsule (2 mg total) by mouth 2 (two) times daily between meals as needed for up to 3 days for diarrhea or loose stools.   Magnesium 250 MG Tabs Take 1 tablet by mouth daily.   metroNIDAZOLE 500 MG tablet Commonly known as: FLAGYL Take 1 tablet (500 mg total) by mouth 3 (three) times daily for 10 doses. Take one tablet tonight when you get home from the hospital. Starting tomorrow take it three times daily until gone. Do not drink alcohol with this medication.   omeprazole 40 MG capsule Commonly known as: PRILOSEC Take 1 capsule (40 mg total) by mouth daily. Must keep appt on 12/06/16 with Dr. Ricky Ala Lancets 93Y Misc Use as instructed to check blood sugar 2-3 times a day.  DX E11.9   pravastatin 20 MG tablet Commonly known as: PRAVACHOL TAKE 1 TABLET BY MOUTH ONCE DAILY   timolol 0.5 % ophthalmic solution Commonly known as: TIMOPTIC Place 1 drop into both eyes 2 (two) times a day.   Tyler Aas FlexTouch 200 UNIT/ML Sopn Generic drug: Insulin Degludec Inject 60  Units into the skin daily.   Vitamin D-3 125 MCG (5000 UT) Tabs Take 1 tablet by mouth daily.       Disposition and follow-up:   Donna Fuller was discharged from [facility] in Good condition.  At the hospital follow up visit please address:  1.  Completion of cefdinir and metronidazole and continued improvement of symptoms.  2.  Incidental finding on CT Chest. Patient noted to have atypical appearing bony structures, particularly the spine, that appear somewhat dense with patchy lucencies, for which the radiologist we spoke to, Dr. Pascal Lux, recommended an outpatient bone  scan.   3.  Radiologic resolution of pneumonia by ordering a repeat chest x-ray 6 weeks from discharge  4. Nocturnal choking episodes reported by the patient in which she wakes up feeling as if she cannot breathe. She also endorses morning headaches and fatigue. We recommend an outpatient sleep apnea work up as sleep apnea may have contributed to aspiration pneumonia.   5. Dysphagia symptoms. Inpatient Speech consult saw no evidence of oropharyngeal dysphagia but recommended outpatient esophageal dysphagia follow up. Patient reports some choking episodes with eating which may have contributed to aspiration pneumonia.   Follow-up Appointments: Follow-up Information    Sandi Mariscal, MD Follow up.   Specialty: Internal Medicine Why: please make a hospital follow up appointment within your PCP in 1 week Contact information: [place] [address] [phone]           Hospital Course by problem list: 1. Pneumonia -pt presented with chest pain who ruled out for ACS with her EKGs despite an elevated troponin which was likely due to a non ACS etiology -she reported a hx of fever and coughing/choking episodes and a CT chest revealed a right lower lobe pneumonia  -She was started on Rocephin and azithromycin but after a continuation of her sx and more history gathering about her choking episodes (at night and when eating), in addition to the location of her pneumonia on imaging, aspiration pneumonia became a greater concern and metronidazole was added for anaerobic coverage after which she began feeling much better, no longer required oxygen and her fevers resolved  2. Choking episodes -pt reports sx of oral dysphagia including choking on food and needing to clear her throat with water -Speech therapy worked her up for oral dysphagia and found no signs but recommended further workup of esophageal dysphagia and thought perhaps the patient was suffering from globus sensation  -upon further investigation of her coughing/choking complaints it became clear she was waking up at night gasping for breath and when prompted she reported morning headaches and fatigue; she denies ever being worked up for sleep apnea but we discussed that with her as important to consider as an outpatient   3. Abdominal pain/nausea/diarrhea  -2 days after beginning antibiotic therapy the patient reported abdominal pain, nausea and diarrhea which we thought to be secondary to antibiotic use -she started imodium which resolved her sx completely  Discharge Vitals:   BP 119/71 (BP Location: Left Arm)   Pulse 86   Temp (!) 97.5 F (36.4 C) (Oral)   Resp 16   Ht '5\' 4"'  (1.626 m)   Wt 91.3 kg   SpO2 98%   BMI 34.55 kg/m   Pertinent Labs, Studies, and Procedures:  06/30 WBC 17.8 --> 18.8 --> 12 06/30 - 07/01 High Sensitivity Troponin of 66 --> 73 --> 204 --> 183 06/30 COVID negative x2 06/30 RVP negative 07/01 Blood  cx no growth 07/02 Hgb A1c 7.9  06/30 Chest X-Ray: mild left basilar atelectasis  06/30 CT Chest: Right lower lobe airspace consolidation consistent with pneumonia, aspiration would be a consideration  07/01 Echo: LVEF 60-65%, right ventricle normal systolic function; normal aortic root, normal IVC  07/02 DG Swallow Function: normal oropharyngeal swallow  Discharge Instructions: Discharge Instructions    Call MD for:  temperature >100.4   Complete by: As directed    Diet - low sodium heart healthy   Complete by: As directed    Increase activity slowly   Complete by: As directed       Signed: Al Decant, MD 06/03/2019, 2:09 PM   Pager: [phone]

## 2019-06-01 NOTE — Progress Notes (Signed)
  Date: 06/01/2019  Patient name: Donna Fuller  Medical record number: [id number]  Date of birth: 19-Sep-1941   I have seen and evaluated this patient and I have discussed the plan of care with the house staff. Please see their note for complete details. I concur with their findings with the following additions/corrections: Donna Fuller seen this morning on team rounds.  1.  Right lower lobe community-acquired pneumonia, possible aspiration pneumonia -her T-max over the past 24 hours was 102.6.  Her WBC is downward trending.  Her O2 sat was in the low 90s on room air.  She has no overt respiratory distress and is able to speak full sentences.  Her lung exam is challenging due to the negative pressure device and poor inspiratory effort.  Since she remains febrile, we will add metronidazole to her azithromycin and Rocephin for anaerobic coverage.  If she does not defervesce in the next 24 hours, will need to reimage and reculture and assess for non-infectious etiologies of fever.  2.  Oropharyngeal dysphagia -she had a modified barium swallow today which was essentially normal.  If she continues to have symptoms, she will need an EGD.  3.  Abnormal osseous structures on CT - m we discussed her case with another health care provider (a radiologist) who pointed out the abnormalities to [place], and felt her remote history of breast cancer would be unlikely to be the etiology, commented that the changes were present on the prior CT in 2018, and recommended a bone scan as an outpatient.  As she is not hypercalcemic, anemic, or with a new renal failure, multiple myeloma work-up is not indicated.  4.  Elevated troponin -this is not due to a primary cardiac issue but her right lower lobe pneumonia.  Her EKG was without ischemic changes on admission.  Billing notes -4 established stable problems reviewed.  We reviewed and ordered clinical lab test, reviewed the results of the modified barium swallow, and  discussed her case with another healthcare provider.  Bartholomew Crews, MD 06/01/2019, 1:40 PM

## 2019-06-01 NOTE — Progress Notes (Signed)
Modified Barium Swallow Progress Note  Patient Details  Name: Donna Fuller MRN: [id number] Date of Birth: 03-06-1941  Today's Date: 06/01/2019  Modified Barium Swallow completed.  Full report located under Chart Review in the Imaging Section.  Brief recommendations include the following:  Clinical Impression  Pt presents with normal oropharyngeal swallow with adequate mastication, timeliness of pharyngeal swallow response, no penetration into the laryngeal vestibule; no aspiration.  There was adequate pharyngeal squeeze, no residue in pharynx post-swallow.  Screen of the esophagus revealed no obvious barium retention.  13 mm barium pill passed easily through UES and esophagus.  Pt cleared her throat constantly throughout exam.  Her reports of "food sticking" may be globus-related; she may benefit from further esophageal w/u; however, oropharyngeal function was unremarkable.  Continue current diet.    Swallow Evaluation Recommendations       SLP Diet Recommendations: Regular solids;Thin liquid   Liquid Administration via: Cup;Straw   Medication Administration: Whole meds with liquid   Supervision: Patient able to self feed           Oral Care Recommendations: Oral care BID       Kaden Dunkel L. Tivis Ringer, [place] Office number [phone] Pager [phone]  [name] 06/01/2019,10:10 AM

## 2019-06-01 NOTE — Progress Notes (Signed)
Initial Nutrition Assessment  DOCUMENTATION CODES:   Obesity unspecified  INTERVENTION:   -D/c Ensure per patient request -Provide Magic cup TID with meals, each supplement provides 290 kcal and 9 grams of protein  NUTRITION DIAGNOSIS:   Inadequate oral intake related to dysphagia as evidenced by per patient/family report.  GOAL:   Patient will meet greater than or equal to 90% of their needs   MONITOR:   PO intake, Supplement acceptance, Labs, Weight trends, I & O's  REASON FOR ASSESSMENT:   Malnutrition Screening Tool    ASSESSMENT:   78 y.o. woman with PMHx significant for HTN, HLD, DM, GERD, obesity and breast cancer presenting with complaint of intermittent sternal chest pain for about one month. 6/30: COVID test- negative  **RD working remotely**  Patient consumed 25% of breakfast this morning. Per SLP note today, pt had MBS which showed no aspiration or oropharyngeal dysphagia. States pt may benefit from work up of esophagus.  Pt has been having choking and coughing episodes PTA after eating solid foods, fluids are not an issue.   Pt has been trying Ensure supplements but pt states she does not tolerate them. Will d/c and add Magic Cups to meals instead.  Per weight records, no weights available in history since 2018. Last recorded as weighing 219 lbs in August 2018.  Medications reviewed. Labs reviewed: CBGs: 122   NUTRITION - FOCUSED PHYSICAL EXAM:  Unable to perform -working remotely.  Diet Order:   Diet Order            Diet heart healthy/carb modified Room service appropriate? Yes; Fluid consistency: Thin  Diet effective now              EDUCATION NEEDS:   No education needs have been identified at this time  Skin:  Skin Assessment: Reviewed RN Assessment  Last BM:  7/2  Height:   Ht Readings from Last 1 Encounters:  05/31/19 5\' 4"  (1.626 m)    Weight:   Wt Readings from Last 1 Encounters:  05/31/19 91.3 kg    Ideal Body  Weight:  54.5 kg  BMI:  Body mass index is 34.55 kg/m.  Estimated Nutritional Needs:   Kcal:  1600-1800  Protein:  65-75g  Fluid:  1.8L/day  Donna Bibles, MS, RD, LDN [place] Dietitian Pager: [phone] After Hours Pager: [phone]

## 2019-06-01 NOTE — Progress Notes (Signed)
Student Pharmacist rounding with [facility], was consulted on antibiotic selection for a 78 year old female admitted for community acquired pneumonia with new concern for aspiration.  The patient spiked a fever of 102.6 F overnight and the team is considering additional antibiotic coverage for anaerobic organisms. Currently EW is being managed with ceftriaxone 1g every 24 hours and azithromycin 250mg  once daily by mouth. EW is a 78 year old female weighing 201 lbs (91.3kg) with a creatinine clearance of 36.6 mL/min and white blood count of 12K/uL. Given the patient's documented penicillin allergy (swelling), team will defer upon use of Zosyn. A recommendation for starting metronidazole for additional anaerobic coverage has been provided to the team. The team has started metronidazole 500mg  by mouth every eight hours. Continue to monitor for adverse effects of metronidazole such as metallic taste and educate EW on avoiding alcohol if discharged with this medication.  Donna Fuller, 4th year student pharmacist

## 2019-06-01 NOTE — Progress Notes (Signed)
   Subjective: Donna Fuller was not feeling well today. She reported being very cold and tired. She believes her cough has gotten worse. She reports no pain with urination. We discussed her penicillin allergy and she states she would experience swelling of the face and mouth with it. She also reports pain in her left upper quadrant. Otherwise she has no complaints or concerns.   Objective:  Vital signs in last 24 hours: Vitals:   05/31/19 1534 06/01/19 0009 06/01/19 0621 06/01/19 0700  BP: (!) 159/83 (!) 148/56 (!) 148/74 140/64  Pulse: (!) 109 93 87 90  Resp: 20   18  Temp: (!) 102.6 F (39.2 C) 99.3 F (37.4 C) 99.9 F (37.7 C) 98.7 F (37.1 C)  TempSrc: Oral Oral Oral Oral  SpO2: 97% 93% 95% 100%  Weight:      Height:       Physical Exam  HENT:  Head: Normocephalic and atraumatic.  Cardiovascular: Regular rhythm and normal heart sounds.  No murmur heard. Tachycardic   Pulmonary/Chest: Effort normal. No respiratory distress.  Hard to appreciate breath sounds due to ambient noise; cough; left chest wall/LUQ tender to palpation  Abdominal: Soft. She exhibits no distension.  Skin: Skin is warm.  Nursing note and vitals reviewed.   Assessment/Plan:  Principal Problem:   Pleuritic chest pain/fever -patient spiked a fever yesterday and given such and the fact that she is not feeling any better, has a hx of oropharyngeal dysphagia and a RLL pneumonia we are adding metronidazole for anaerobic coverage to her current regimen -we asked her about urinary sx as their was some bacteria seen on her original urinalysis but she doesn't report any so that is unlikely to be the reason for her fever -she is still experiencing pleuritic pain in her LUQ for which we have prescribed a lidocaine patch   Dysphagia -saw speech today and underwent a barium swallow which showed normal oropharyngeal swallow; speech believes that she may benefit from further esophageal work up    Incidental Bone  Findings -on CT chest the spine appears dense with patchy lucencies which could be due to a metabolic bone disease, myeloma or breast cancer metastasis; discussed with Dr. Pascal Lux in radiology -spine appears similar on prior CTs but outpatient bone scan will be ordered for follow up   Dispo: Anticipated discharge with resolution of acute sx of her pneumonia and transition to oral medications  Al Decant, MD 06/01/2019, 1:25 PM Pager: [phone]

## 2019-06-01 NOTE — Progress Notes (Signed)
Updated pt Daughter, Hilda Blades, on her moms status.

## 2019-06-01 NOTE — Evaluation (Signed)
Occupational Therapy Evaluation Patient Details Name: Donna Fuller MRN: [id number] DOB: 03/13/1941 Today's Date: 06/01/2019    History of Present Illness 78 y.o. female admitted on 05/30/19 for sternal chest pain.  COVID testing (-).  Dx with PNA (RLL), chest pain (concern for ACS, but EKG was negative), thought to be pleuriritic in nature.  Pt with significant PMH of spinal stenosis, HTN, gout, DM, breast CA R breast s/p mastectomy, arthritis s/p L TKA.     Clinical Impression   Pt PTA: living with family at home and independent. Pt currently limited by SOB and not feeling well with exertion. O2 sats  >90% on RA. Pt performing minguardA for standing for ADL at commode and at sink with fair balance and occasional use of UE wt bearing for support. Pt education provided on energy conservation heavily on splitting chores up and planning the night before. Pt in agreement and handouts provided. Pt would benefit from continued OT skilled services for ADL, mobility and safety in [place] setting. OT following acutely.  O2 90% on RA; HR 110-120 BPM with exertion.    Follow Up Recommendations  Home health OT;Supervision - Intermittent    Equipment Recommendations  None recommended by OT    Recommendations for Other Services       Precautions / Restrictions Precautions Precautions: Other (comment) Precaution Comments: monitor and wean O2 Restrictions Weight Bearing Restrictions: No      Mobility Bed Mobility Overal bed mobility: Modified Independent                Transfers Overall transfer level: Needs assistance Equipment used: 1 person hand held assist Transfers: Sit to/from Stand Sit to Stand: Min guard         General transfer comment: Min guard assist for safety    Balance Overall balance assessment: Needs assistance Sitting-balance support: Feet supported;No upper extremity supported Sitting balance-Leahy Scale: Good     Standing balance support: No upper  extremity supported Standing balance-Leahy Scale: Fair                             ADL either performed or assessed with clinical judgement   ADL Overall ADL's : Needs assistance/impaired Eating/Feeding: Modified independent;Sitting   Grooming: Wash/dry hands;Wash/dry face;Oral care;Applying deodorant;Set up;Standing   Upper Body Bathing: Set up;Standing   Lower Body Bathing: Min guard;Sitting/lateral leans;Sit to/from stand   Upper Body Dressing : Set up;Standing   Lower Body Dressing: Min guard;Sitting/lateral leans;Sit to/from stand   Toilet Transfer: Tour manager Toilet;Grab bars   Toileting- Water quality scientist and Hygiene: Min guard;Sitting/lateral lean;Sit to/from stand       Functional mobility during ADLs: Min guard;Cueing for safety General ADL Comments: Pt modified independence for seated ADL and minguardA for LB ADlL in standing     Vision Baseline Vision/History: No visual deficits Vision Assessment?: No apparent visual deficits     Perception     Praxis      Pertinent Vitals/Pain Pain Assessment: No/denies pain Pain Intervention(s): Monitored during session     Hand Dominance Right   Extremity/Trunk Assessment Upper Extremity Assessment Upper Extremity Assessment: Generalized weakness   Lower Extremity Assessment Lower Extremity Assessment: Generalized weakness   Cervical / Trunk Assessment Cervical / Trunk Assessment: Other exceptions Cervical / Trunk Exceptions: h/o spinal stenosis   Communication Communication Communication: No difficulties   Cognition Arousal/Alertness: Awake/alert Behavior During Therapy: WFL for tasks assessed/performed Overall Cognitive Status: Within Functional Limits for tasks  assessed                                     General Comments  O2 stayed 90% on RA with exertion.    Exercises     Shoulder Instructions      Home Living Family/patient expects to be discharged to::  Private residence Living Arrangements: Spouse/significant other;Children Available Help at Discharge: Family;Available 24 hours/day Type of Home: House Home Access: Level entry     Home Layout: Two level;Able to live on main level with bedroom/bathroom;Full bath on main level Alternate Level Stairs-Number of Steps: pt does not go upstairs   Bathroom Shower/Tub: Teacher, early years/pre: Handicapped height     Home Equipment: Environmental consultant - 2 wheels;Cane - single point;Shower seat          Prior Functioning/Environment Level of Independence: Independent        Comments: does not drive or work, likes to read and walk        OT Problem List: Decreased strength;Decreased activity tolerance;Impaired balance (sitting and/or standing)      OT Treatment/Interventions: Self-care/ADL training;Therapeutic exercise;Neuromuscular education;Energy conservation;Therapeutic activities;Patient/family education;Balance training    OT Goals(Current goals can be found in the care plan section) Acute Rehab OT Goals Patient Stated Goal: to get home soon OT Goal Formulation: With patient Time For Goal Achievement: 06/15/19 Potential to Achieve Goals: Good ADL Goals Pt Will Perform Lower Body Dressing: with modified independence;with adaptive equipment;sit to/from stand Additional ADL Goal #1: Pt will be supervision level for OOB ADL.  OT Frequency: Min 2X/week   Barriers to D/C:            Co-evaluation              AM-PAC OT "6 Clicks" Daily Activity     Outcome Measure Help from another person eating meals?: None Help from another person taking care of personal grooming?: None Help from another person toileting, which includes using toliet, bedpan, or urinal?: None Help from another person bathing (including washing, rinsing, drying)?: A Little Help from another person to put on and taking off regular upper body clothing?: None Help from another person to put on and  taking off regular lower body clothing?: A Little 6 Click Score: 22   End of Session Nurse Communication: Mobility status  Activity Tolerance: Patient tolerated treatment well Patient left: in bed;with call bell/phone within reach;with bed alarm set  OT Visit Diagnosis: Unsteadiness on feet (R26.81);Muscle weakness (generalized) (M62.81)                Time: 9798-9211 OT Time Calculation (min): 30 min Charges:  OT General Charges $OT Visit: 1 Visit OT Evaluation $OT Eval Moderate Complexity: 1 Mod OT Treatments $Self Care/Home Management : 8-22 mins  Ebony Hail Harold Hedge) Marsa Aris OTR/L Acute Rehabilitation Services Pager: [phone] Office: [place] 06/01/2019, 4:42 PM

## 2019-06-02 DIAGNOSIS — R509 Fever, unspecified: Secondary | ICD-10-CM

## 2019-06-02 DIAGNOSIS — J189 Pneumonia, unspecified organism: Secondary | ICD-10-CM

## 2019-06-02 DIAGNOSIS — R109 Unspecified abdominal pain: Secondary | ICD-10-CM

## 2019-06-02 DIAGNOSIS — R0781 Pleurodynia: Secondary | ICD-10-CM

## 2019-06-02 DIAGNOSIS — R197 Diarrhea, unspecified: Secondary | ICD-10-CM

## 2019-06-02 LAB — GLUCOSE, CAPILLARY
Glucose-Capillary: 107 mg/dL — ABNORMAL HIGH (ref 70–99)
Glucose-Capillary: 162 mg/dL — ABNORMAL HIGH (ref 70–99)
Glucose-Capillary: 173 mg/dL — ABNORMAL HIGH (ref 70–99)

## 2019-06-02 LAB — BASIC METABOLIC PANEL
Anion gap: 10 (ref 5–15)
BUN: 23 mg/dL (ref 8–23)
CO2: 26 mmol/L (ref 22–32)
Calcium: 8.7 mg/dL — ABNORMAL LOW (ref 8.9–10.3)
Chloride: 103 mmol/L (ref 98–111)
Creatinine, Ser: 1.29 mg/dL — ABNORMAL HIGH (ref 0.44–1.00)
GFR calc Af Amer: 46 mL/min — ABNORMAL LOW (ref >60)
GFR calc non Af Amer: 40 mL/min — ABNORMAL LOW (ref >60)
Glucose, Bld: 125 mg/dL — ABNORMAL HIGH (ref 70–99)
Potassium: 4 mmol/L (ref 3.5–5.1)
Sodium: 139 mmol/L (ref 135–145)

## 2019-06-02 LAB — HEPATIC FUNCTION PANEL
ALT: 28 U/L (ref 0–44)
AST: 49 U/L — ABNORMAL HIGH (ref 15–41)
Albumin: 2.4 g/dL — ABNORMAL LOW (ref 3.5–5.0)
Alkaline Phosphatase: 86 U/L (ref 38–126)
Bilirubin, Direct: 0.2 mg/dL (ref 0.0–0.2)
Indirect Bilirubin: 0.3 mg/dL (ref 0.3–0.9)
Total Bilirubin: 0.5 mg/dL (ref 0.3–1.2)
Total Protein: 6.6 g/dL (ref 6.5–8.1)

## 2019-06-02 LAB — LIPASE, BLOOD: Lipase: 39 U/L (ref 11–51)

## 2019-06-02 MED ORDER — LOPERAMIDE HCL 2 MG PO CAPS
2.0000 mg | ORAL_CAPSULE | Freq: Once | ORAL | Status: AC
  Administered 2019-06-02: 2 mg via ORAL
  Filled 2019-06-02: qty 1

## 2019-06-02 MED ORDER — LOPERAMIDE HCL 2 MG PO CAPS
2.0000 mg | ORAL_CAPSULE | Freq: Two times a day (BID) | ORAL | Status: DC | PRN
  Administered 2019-06-02: 2 mg via ORAL
  Filled 2019-06-02: qty 1

## 2019-06-02 NOTE — Progress Notes (Signed)
   Subjective: Donna Fuller was feeling unwell this morning. She complained of fatigue, nausea and epigastric abdominal pain. She says the lidocaine patch has been helpful. She had 2 episodes of diarrhea yesterday and as such has not been eating much as it feels like it just goes right through her. She denies blood or mucus in her stool. This is new and she did not have diarrhea prior to her admission. She has taken immodium before and it worked well for her. Her cough has improved. She has no other complaints or concerns. Later this morning, our colleague Dr. Tarri Abernethy saw her and she reported feeling great.   Objective:  Vital signs in last 24 hours: Vitals:   06/01/19 0700 06/01/19 1731 06/01/19 2025 06/02/19 0007  BP: 140/64 (!) 151/64  (!) 151/73  Pulse: 90 99  82  Resp: 18 18  17   Temp: 98.7 F (37.1 C) (!) 101.1 F (38.4 C) 98.4 F (36.9 C) 98.8 F (37.1 C)  TempSrc: Oral Oral Oral Oral  SpO2: 100% 100%  99%  Weight:      Height:       Labs: Creatinine 1.29 (yesterday Creatinine 1.41) LFTs: AST 49 and ALT 28   Imaging: No new imaging  Physical Exam  HENT:  Head: Normocephalic.  Cardiovascular: Normal rate, regular rhythm and normal heart sounds.  Pulmonary/Chest: Effort normal and breath sounds normal. No respiratory distress.  Abdominal: She exhibits no distension. There is abdominal tenderness (tender to palpation in the upper quadrants and epigastrium).  Lidocaine patch over upper right quadrant   Skin: She is not diaphoretic.  Nursing note and vitals reviewed.  Assessment/Plan:  Principal Problem:   Pleuritic chest pain/Pneumonia/fever -likely secondary to pneumonia  -receiving azithrmycino, ceftriaxone and metronidazole  -patient fevered yesterday to 101.1 after addition of metronidazole but we will continue course as she had only just begun treatment with this third antibiotic  -patient was feeling very fatigued earlier this am but improved later in the morning  -lidocaine has helped with her pleuritic pain  -we will monitor patient sx and fever curve  Active Problems:  Abdominal Pain/Diarrhea -patient reports new onset nausea and abdominal pain with associated diarrhea which is impacting her appetite  -she was very tender on exam -likely secondary to antibiotic use -we have prescribed imodium to help with her stools  -we also ordered LFTs which we unremarkable -we have a low suspicion of pancreatitis but are ordering a lipase to rule out given pain in epigastrium and decreased appetite -plan to follow up lipase results and check patient sx relief from imodium   Dispo: Anticipated discharge upon improvement of her viral sx. Al Decant, MD 06/02/2019, 6:44 AM Pager: [phone]

## 2019-06-02 NOTE — Progress Notes (Signed)
Physical Therapy Treatment Patient Details Name: Donna Fuller MRN: [id number] DOB: [birth year] Today's Date: 06/02/2019    History of Present Illness 78 y.o. female admitted on 05/30/19 for sternal chest pain.  COVID testing (-).  Dx with PNA (RLL), chest pain (concern for ACS, but EKG was negative), thought to be pleuriritic in nature.  Pt with significant PMH of spinal stenosis, HTN, gout, DM, breast CA R breast s/p mastectomy, arthritis s/p L TKA.      PT Comments    Pt progressing well with mobility. She required supervision transfers and min guard assist ambulation 150 feet without AD. Pt on RA throughout session with SpO2 90% during mobility. Recovery to 92% sitting EOB at end of session.    Follow Up Recommendations  No PT follow up;Supervision for mobility/OOB     Equipment Recommendations  3in1 (PT);Other (comment)(for use in the shower at home)    Recommendations for Other Services       Precautions / Restrictions Precautions Precautions: Other (comment) Precaution Comments: monitor O2    Mobility  Bed Mobility Overal bed mobility: Modified Independent                Transfers Overall transfer level: Needs assistance Equipment used: None Transfers: Sit to/from Stand;Stand Pivot Transfers Sit to Stand: Supervision Stand pivot transfers: Supervision       General transfer comment: increased time to stabilize initial standing balance, supervision for safety  Ambulation/Gait Ambulation/Gait assistance: Min guard Gait Distance (Feet): 150 Feet Assistive device: None Gait Pattern/deviations: Step-through pattern;Decreased stride length Gait velocity: decreased Gait velocity interpretation: 1.31 - 2.62 ft/sec, indicative of limited community ambulator General Gait Details: slow, guarded gait. Ambulated on RA with SpO2 90%. Recovery to 92% sitting EOB.   Stairs             Wheelchair Mobility    Modified Rankin (Stroke Patients Only)        Balance Overall balance assessment: Needs assistance Sitting-balance support: Feet supported;No upper extremity supported Sitting balance-Leahy Scale: Good     Standing balance support: No upper extremity supported;During functional activity Standing balance-Leahy Scale: Fair                              Cognition Arousal/Alertness: Awake/alert Behavior During Therapy: WFL for tasks assessed/performed Overall Cognitive Status: Within Functional Limits for tasks assessed                                        Exercises      General Comments        Pertinent Vitals/Pain Pain Assessment: No/denies pain    Home Living                      Prior Function            PT Goals (current goals can now be found in the care plan section) Acute Rehab PT Goals Patient Stated Goal: home PT Goal Formulation: With patient Time For Goal Achievement: 06/14/19 Potential to Achieve Goals: Good Progress towards PT goals: Progressing toward goals    Frequency           PT Plan Current plan remains appropriate    Co-evaluation              AM-PAC PT "6 Clicks" Mobility   Outcome  Measure  Help needed turning from your back to your side while in a flat bed without using bedrails?: None Help needed moving from lying on your back to sitting on the side of a flat bed without using bedrails?: None Help needed moving to and from a bed to a chair (including a wheelchair)?: None Help needed standing up from a chair using your arms (e.g., wheelchair or bedside chair)?: None Help needed to walk in hospital room?: A Little Help needed climbing 3-5 steps with a railing? : A Little 6 Click Score: 22    End of Session Equipment Utilized During Treatment: Gait belt Activity Tolerance: Patient tolerated treatment well Patient left: in bed;with call bell/phone within reach Nurse Communication: Mobility status PT Visit Diagnosis: Muscle weakness  (generalized) (M62.81);Difficulty in walking, not elsewhere classified (R26.2)     Time: 9774-1423 PT Time Calculation (min) (ACUTE ONLY): 12 min  Charges:  $Gait Training: 8-22 mins                     Lorrin Goodell, PT  Office # [phone] Pager [phone]    Lorriane Shire 06/02/2019, 11:04 AM

## 2019-06-02 NOTE — Care Management Important Message (Signed)
Important Message  Patient Details  Name: Donna Fuller MRN: [id number] Date of Birth: 07-26-1941   Medicare Important Message Given:  Yes     Lewis Keats 06/02/2019, 2:02 PM

## 2019-06-02 NOTE — Progress Notes (Signed)
  Date: 06/02/2019  Patient name: Donna Fuller  Medical record number: [id number]  Date of birth: 12-06-1940   I have seen and evaluated this patient and I have discussed the plan of care with the house staff. Please see their note for complete details. I concur with their findings with the following additions/corrections: [name] was seen this morning on team rounds.  She continues to feel poorly and states that she feels worse today than she did yesterday.  She remains without hypoxia and is on room air.  Lungs are clear to auscultation.  Her leukocytosis is downtrending.  her temperature curve has been consistently decreasing.  On the 30th her T-max is 70, on the first it was 102, yesterday the second it was 101.  Her last fever was yesterday at 5 PM and was 101.1.  This is reassuring that the antibiotics are appropriately covering her right lower lobe pneumonia, presumed aspiration.  Therefore, we will continue with metronidazole, ceftriaxone, and azithromycin.  As she is responding is no need for further work-up.  She states she continues to feel poorly and weak without any energy.  She continues to complain of abdominal pain bilaterally and is very tender on palpation.  Eating does not worsen her pain.  She is also having postprandial diarrhea.  She states she was able to work with physical therapy yesterday and it felt great and did not cause her to have any increased fatigue.  Subjectively she is not making much progress but objectively she is responding as expected although a little bit slower than expected.  We will continue current therapy and reassess in the morning.  Bartholomew Crews, MD 06/02/2019, 3:13 PM

## 2019-06-03 LAB — COMPREHENSIVE METABOLIC PANEL
ALT: 32 U/L (ref 0–44)
AST: 39 U/L (ref 15–41)
Albumin: 2.5 g/dL — ABNORMAL LOW (ref 3.5–5.0)
Alkaline Phosphatase: 92 U/L (ref 38–126)
Anion gap: 12 (ref 5–15)
BUN: 21 mg/dL (ref 8–23)
CO2: 27 mmol/L (ref 22–32)
Calcium: 9 mg/dL (ref 8.9–10.3)
Chloride: 100 mmol/L (ref 98–111)
Creatinine, Ser: 1.19 mg/dL — ABNORMAL HIGH (ref 0.44–1.00)
GFR calc Af Amer: 51 mL/min — ABNORMAL LOW (ref >60)
GFR calc non Af Amer: 44 mL/min — ABNORMAL LOW (ref >60)
Glucose, Bld: 124 mg/dL — ABNORMAL HIGH (ref 70–99)
Potassium: 3.8 mmol/L (ref 3.5–5.1)
Sodium: 139 mmol/L (ref 135–145)
Total Bilirubin: 0.6 mg/dL (ref 0.3–1.2)
Total Protein: 6.8 g/dL (ref 6.5–8.1)

## 2019-06-03 LAB — GLUCOSE, CAPILLARY
Glucose-Capillary: 156 mg/dL — ABNORMAL HIGH (ref 70–99)
Glucose-Capillary: 116 mg/dL — ABNORMAL HIGH (ref 70–99)

## 2019-06-03 LAB — CBC
HCT: 36 % (ref 36.0–46.0)
Hemoglobin: 11.7 g/dL — ABNORMAL LOW (ref 12.0–15.0)
MCH: 28 pg (ref 26.0–34.0)
MCHC: 32.5 g/dL (ref 30.0–36.0)
MCV: 86.1 fL (ref 80.0–100.0)
Platelets: 249 10*3/uL (ref 150–400)
RBC: 4.18 MIL/uL (ref 3.87–5.11)
RDW: 15.3 % (ref 11.5–15.5)
WBC: 8.4 10*3/uL (ref 4.0–10.5)
nRBC: 0 % (ref 0.0–0.2)

## 2019-06-03 MED ORDER — LOPERAMIDE HCL 2 MG PO CAPS: 2.0000 mg | ORAL_CAPSULE | Freq: Two times a day (BID) | ORAL | 0 refills | Status: AC | PRN

## 2019-06-03 MED ORDER — METRONIDAZOLE 500 MG PO TABS: 500.0000 mg | ORAL_TABLET | Freq: Three times a day (TID) | ORAL | 0 refills | Status: AC

## 2019-06-03 MED ORDER — GUAIFENESIN-DM 100-10 MG/5ML PO SYRP: 5.0000 mL | ORAL_SOLUTION | ORAL | 0 refills | Status: DC | PRN

## 2019-06-03 MED ORDER — CEFDINIR 300 MG PO CAPS
300.0000 mg | ORAL_CAPSULE | Freq: Every day | ORAL | Status: DC
  Filled 2019-06-03: qty 1

## 2019-06-03 MED ORDER — SODIUM CHLORIDE 0.9 % IV SOLN
1.0000 g | INTRAVENOUS | Status: DC
  Filled 2019-06-03: qty 10

## 2019-06-03 MED ORDER — ALUM & MAG HYDROXIDE-SIMETH 200-200-20 MG/5ML PO SUSP: 30.0000 mL | Freq: Four times a day (QID) | ORAL | Status: DC | PRN

## 2019-06-03 MED ORDER — CEFDINIR 300 MG PO CAPS: 300.0000 mg | ORAL_CAPSULE | Freq: Every day | ORAL | 0 refills | Status: AC

## 2019-06-03 NOTE — Progress Notes (Signed)
   Subjective: Donna Fuller is feeling much improved today. She reports a good nights sleep, less fatigue and no episodes of diarrhea. Her abdominal pain is also improved. She continues to experience a cough and reports early satiety and some nausea. She reports waking up with choking episodes which have been chronic. She also reports associated chronic morning headaches. She denies previous work up for sleep apnea. Pt saw PT yesterday. She has no other issues or complaints at this time. She is interested in going home today if she continues feeling well.   Objective:  Vital signs in last 24 hours: Vitals:   06/02/19 0007 06/02/19 0803 06/02/19 1717 06/02/19 2340  BP: (!) 151/73 131/67 (!) 141/77 131/71  Pulse: 82 87 98 88  Resp: 17 18 19 20   Temp: 98.8 F (37.1 C) 98.4 F (36.9 C) 98.4 F (36.9 C) 98.4 F (36.9 C)  TempSrc: Oral Oral Oral Oral  SpO2: 99% 99% 96% 92%  Weight:      Height:       Physical Exam  Constitutional: She is oriented to person, place, and time and well-developed, well-nourished, and in no distress. No distress.  HENT:  Head: Normocephalic and atraumatic.  Neck:  Excess neck tissue  Cardiovascular: Normal rate, regular rhythm and normal heart sounds.  Pulmonary/Chest: Effort normal. No respiratory distress. She has no wheezes. She has no rales.  Abdominal: Soft. Bowel sounds are normal. She exhibits no distension. There is no abdominal tenderness.  Lidocaine patch present over RUQ  Neurological: She is alert and oriented to person, place, and time.  Skin: Skin is warm and dry. She is not diaphoretic. No erythema.    Labs: WBC 8.4 Creatinine 1.19 Glucose 162, 173 and 156  Imaging: No new imaging  Assessment/Plan:  Principal Problem:   Aspiration pneumonia -RLL pneumonia on imaging with hx of frequent dysphagia and nocturnal choking episodes -pt being treated with azithromycin, ceftriaxone and metronidazole, received last dose of azithro yesterday  and will receive last dose of ceftriaxone today, has a few more doses of metronidazole oral -patient did not fever yesterday and feeling better symptomatically since addition of metronidazole -pt WBC WNL for first time since admission at 8.4 -patient saw PT yesterday and was able to walk around on RA without desatting -will repeat oxygen trial and if patient still asymptomatic and can walk without desatting will discharge later today  Active Problems:   Nausea/Abdominal pain/Diarrhea  -patient continues to feel slightly nauseous with early satiety which could be secondary to antibiotic use; otherwise her abdominal pain has resolved -diarrhea, likely secondary to antibiotic use, has resolved with addition of immodium   Nocturnal Choking/Dysphagia -patient reports chronic nocturnal choking episodes with associated headaches on awakening; denies prior work up for sleep apnea which we will recommend outpatient; sleep apnea puts pt more at risk for aspiration and aspiration pneumonia and could have contributed to her current pneumonia -pt also reports history of oral dysphagia and globus sensation which can also contribute to aspiration pneumonia; speech saw no evidence of oral dysphagia but outpatient workup of her dysphagia will be recommended as it could be a contributor to her pneumonia       Chronic conditions -Creatinine at baseline at 1.19 -Glucose has been in 150s-170s so will encourage glucose check and insulin adjustment at follow up     Dispo: Anticipated discharge today.  Al Decant, MD 06/03/2019, 6:49 AM Pager: [phone]

## 2019-06-03 NOTE — Progress Notes (Signed)
SATURATION QUALIFICATIONS: (This note is used to comply with regulatory documentation for home oxygen)  Patient Saturations on Room Air at Rest = 95%  Patient Saturations on Room Air while Ambulating = 90%  Patient Saturations on 0 Liters of oxygen while Ambulating = 90%  Please briefly explain why patient needs home oxygen: Patient does not appear to require oxygen. Oxygen saturation dropped as low as 90% on room air.

## 2019-06-03 NOTE — Discharge Instructions (Signed)
You were admitted for pneumonia and treated with three different antibiotics. You will continue taking two of these antibiotics after you leave the hospital. One you will finish tonight. The other you will finish in 3 days. Please see your medication instructions for further details. You experienced some diarrhea from your antibiotics for which we gave you imodium. This improved your diarrhea and we will prescribe you some more on discharge from the hospital. While you were here you also complained of some trouble swallowing and choking episodes at night. Please discuss these issues with your primary care physician at your hospital follow up appointment as they may have contributed to your pneumonia.

## 2019-06-05 LAB — CULTURE, BLOOD (ROUTINE X 2)
Culture: NO GROWTH
Culture: NO GROWTH

## 2019-06-05 LAB — GLUCOSE, CAPILLARY: Glucose-Capillary: 114 mg/dL — ABNORMAL HIGH (ref 70–99)

## 2019-06-07 DIAGNOSIS — Z09 Encounter for follow-up examination after completed treatment for conditions other than malignant neoplasm: Secondary | ICD-10-CM | POA: Diagnosis not present

## 2019-06-07 DIAGNOSIS — I10 Essential (primary) hypertension: Secondary | ICD-10-CM | POA: Diagnosis not present

## 2019-06-07 DIAGNOSIS — Z794 Long term (current) use of insulin: Secondary | ICD-10-CM | POA: Diagnosis not present

## 2019-06-07 DIAGNOSIS — E119 Type 2 diabetes mellitus without complications: Secondary | ICD-10-CM | POA: Diagnosis not present

## 2019-06-19 DIAGNOSIS — E113392 Type 2 diabetes mellitus with moderate nonproliferative diabetic retinopathy without macular edema, left eye: Secondary | ICD-10-CM | POA: Diagnosis not present

## 2019-06-19 DIAGNOSIS — H35351 Cystoid macular degeneration, right eye: Secondary | ICD-10-CM | POA: Diagnosis not present

## 2019-06-19 DIAGNOSIS — H401132 Primary open-angle glaucoma, bilateral, moderate stage: Secondary | ICD-10-CM | POA: Diagnosis not present

## 2019-07-28 DIAGNOSIS — H401132 Primary open-angle glaucoma, bilateral, moderate stage: Secondary | ICD-10-CM | POA: Diagnosis not present

## 2019-07-28 DIAGNOSIS — E11311 Type 2 diabetes mellitus with unspecified diabetic retinopathy with macular edema: Secondary | ICD-10-CM | POA: Diagnosis not present

## 2019-07-28 DIAGNOSIS — E119 Type 2 diabetes mellitus without complications: Secondary | ICD-10-CM | POA: Diagnosis not present

## 2019-07-28 DIAGNOSIS — Z794 Long term (current) use of insulin: Secondary | ICD-10-CM | POA: Diagnosis not present

## 2019-08-08 DIAGNOSIS — E119 Type 2 diabetes mellitus without complications: Secondary | ICD-10-CM | POA: Diagnosis not present

## 2019-08-08 DIAGNOSIS — Z1329 Encounter for screening for other suspected endocrine disorder: Secondary | ICD-10-CM | POA: Diagnosis not present

## 2019-08-08 DIAGNOSIS — E7800 Pure hypercholesterolemia, unspecified: Secondary | ICD-10-CM | POA: Diagnosis not present

## 2019-08-08 DIAGNOSIS — Z794 Long term (current) use of insulin: Secondary | ICD-10-CM | POA: Diagnosis not present

## 2019-08-08 DIAGNOSIS — N183 Chronic kidney disease, stage 3 (moderate): Secondary | ICD-10-CM | POA: Diagnosis not present

## 2019-08-08 DIAGNOSIS — Z1159 Encounter for screening for other viral diseases: Secondary | ICD-10-CM | POA: Diagnosis not present

## 2019-08-08 DIAGNOSIS — I10 Essential (primary) hypertension: Secondary | ICD-10-CM | POA: Diagnosis not present

## 2019-08-08 DIAGNOSIS — Z0000 Encounter for general adult medical examination without abnormal findings: Secondary | ICD-10-CM | POA: Diagnosis not present

## 2019-08-08 DIAGNOSIS — J159 Unspecified bacterial pneumonia: Secondary | ICD-10-CM | POA: Diagnosis not present

## 2019-08-08 DIAGNOSIS — Z113 Encounter for screening for infections with a predominantly sexual mode of transmission: Secondary | ICD-10-CM | POA: Diagnosis not present

## 2019-10-30 ENCOUNTER — Other Ambulatory Visit (HOSPITAL_BASED_OUTPATIENT_CLINIC_OR_DEPARTMENT_OTHER): Payer: Self-pay | Admitting: Internal Medicine

## 2019-10-30 DIAGNOSIS — Z1231 Encounter for screening mammogram for malignant neoplasm of breast: Secondary | ICD-10-CM

## 2019-11-08 DIAGNOSIS — I10 Essential (primary) hypertension: Secondary | ICD-10-CM | POA: Diagnosis not present

## 2019-11-08 DIAGNOSIS — M545 Low back pain: Secondary | ICD-10-CM | POA: Diagnosis not present

## 2019-11-08 DIAGNOSIS — Z1159 Encounter for screening for other viral diseases: Secondary | ICD-10-CM | POA: Diagnosis not present

## 2019-11-08 DIAGNOSIS — E119 Type 2 diabetes mellitus without complications: Secondary | ICD-10-CM | POA: Diagnosis not present

## 2019-11-08 DIAGNOSIS — E7800 Pure hypercholesterolemia, unspecified: Secondary | ICD-10-CM | POA: Diagnosis not present

## 2019-11-13 DIAGNOSIS — Z780 Asymptomatic menopausal state: Secondary | ICD-10-CM | POA: Diagnosis not present

## 2019-11-27 ENCOUNTER — Ambulatory Visit (HOSPITAL_BASED_OUTPATIENT_CLINIC_OR_DEPARTMENT_OTHER): Payer: PPO

## 2019-11-28 ENCOUNTER — Other Ambulatory Visit: Payer: Self-pay

## 2019-11-28 ENCOUNTER — Ambulatory Visit (HOSPITAL_BASED_OUTPATIENT_CLINIC_OR_DEPARTMENT_OTHER)
Admission: RE | Admit: 2019-11-28 | Discharge: 2019-11-28 | Disposition: A | Discharge status: Home / Self Care | Payer: PPO | Source: Ambulatory Visit | Attending: Internal Medicine | Admitting: Internal Medicine

## 2019-11-28 DIAGNOSIS — Z1231 Encounter for screening mammogram for malignant neoplasm of breast: Secondary | ICD-10-CM | POA: Diagnosis not present

## 2019-12-07 DIAGNOSIS — Z96652 Presence of left artificial knee joint: Secondary | ICD-10-CM | POA: Diagnosis not present

## 2019-12-07 DIAGNOSIS — Z471 Aftercare following joint replacement surgery: Secondary | ICD-10-CM | POA: Diagnosis not present

## 2019-12-07 DIAGNOSIS — Z96651 Presence of right artificial knee joint: Secondary | ICD-10-CM | POA: Diagnosis not present

## 2019-12-09 DIAGNOSIS — E119 Type 2 diabetes mellitus without complications: Secondary | ICD-10-CM | POA: Diagnosis not present

## 2019-12-09 DIAGNOSIS — N939 Abnormal uterine and vaginal bleeding, unspecified: Secondary | ICD-10-CM | POA: Diagnosis not present

## 2019-12-11 ENCOUNTER — Other Ambulatory Visit: Payer: Self-pay

## 2019-12-13 ENCOUNTER — Telehealth: Payer: Self-pay | Admitting: Internal Medicine

## 2019-12-13 ENCOUNTER — Encounter: Payer: Self-pay | Admitting: Internal Medicine

## 2019-12-13 ENCOUNTER — Other Ambulatory Visit: Payer: Self-pay

## 2019-12-13 ENCOUNTER — Ambulatory Visit: Payer: PPO | Admitting: Internal Medicine

## 2019-12-13 VITALS — BP 118/64 | HR 79 | Temp 97.9°F | Ht 62.0 in | Wt 202.2 lb

## 2019-12-13 DIAGNOSIS — E1142 Type 2 diabetes mellitus with diabetic polyneuropathy: Secondary | ICD-10-CM | POA: Diagnosis not present

## 2019-12-13 DIAGNOSIS — R739 Hyperglycemia, unspecified: Secondary | ICD-10-CM | POA: Diagnosis not present

## 2019-12-13 DIAGNOSIS — E1165 Type 2 diabetes mellitus with hyperglycemia: Secondary | ICD-10-CM

## 2019-12-13 LAB — GLUCOSE, POCT (MANUAL RESULT ENTRY): POC Glucose: 235 mg/dl — AB (ref 70–99)

## 2019-12-13 MED ORDER — GLIPIZIDE 5 MG PO TABS: 5.0000 mg | ORAL_TABLET | Freq: Every day | ORAL | 4 refills | Status: DC

## 2019-12-13 NOTE — Patient Instructions (Signed)
-   Start Glipizide 5 mg before breakfast  - Continue Trulicity 1.5 mg weekly  - Continue Actos 15 mg daily     - Check sugar before breakfast and Supper time    - HOW TO TREAT LOW BLOOD SUGARS (Blood sugar LESS THAN 70 MG/DL)  Please follow the RULE OF 15 for the treatment of hypoglycemia treatment (when your (blood sugars are less than 70 mg/dL)    STEP 1: Take 15 grams of carbohydrates when your blood sugar is low, which includes:   3-4 GLUCOSE TABS  OR  3-4 OZ OF JUICE OR REGULAR SODA OR  ONE TUBE OF GLUCOSE GEL     STEP 2: RECHECK blood sugar in 15 MINUTES STEP 3: If your blood sugar is still low at the 15 minute recheck --> then, go back to STEP 1 and treat AGAIN with another 15 grams of carbohydrates.

## 2019-12-13 NOTE — Progress Notes (Signed)
Name: Donna Fuller  MRN/ DOB: [id number], May 09, 1941   Age/ Sex: 79 y.o., female    PCP: Donna Mariscal, MD   Reason for Endocrinology Evaluation: Type 2 Diabetes Mellitus     Date of Initial Endocrinology Visit: 12/14/2019     PATIENT IDENTIFIER: Donna Fuller is a 79 y.o. female with a past medical history of T2Dm, HTN and dyslipidemia . The patient presented for initial endocrinology clinic visit on 12/14/2019 for consultative assistance with her diabetes management.    HPI: [name] was    Diagnosed with DM  Years  Prior Medications tried/Intolerance: tresiba, Metformin - renal complications , Janumet , Wilder Glade  Currently checking blood sugars 2x / day,  before breakfast and before supper  Hypoglycemia episodes : no               Hemoglobin A1c has ranged from 6.0 %in 2015, peaking at 8.8% in 2020. Patient required assistance for hypoglycemia:  Patient has required hospitalization within the last 1 year from hyper or hypoglycemia:   In terms of diet, the patient eats 2 meals a day, snacks during the day and drinks sugar-sweetened beverages.    HOME DIABETES REGIMEN: Trulicity 1.5 mg weekly  Pioglitazone 15 mg daily    Statin: Yes ACE-I/ARB:Yes  Prior Diabetic Education: yes   METER DOWNLOAD SUMMARY: Did not bring    DIABETIC COMPLICATIONS: Microvascular complications:   Neuropathy   Denies: retinopathy , CKD  Last eye exam: Completed 4/21020  Macrovascular complications:    Denies: CAD, PVD, CVA   PAST HISTORY: Past Medical History:  Past Medical History:  Diagnosis Date  . Arthritis   . Breast cancer [facility]) 2007   Right breast, s/p mastectomy  . Diabetes mellitus without complication ([place])   . GERD (gastroesophageal reflux disease)   . Gout   . Hyperlipidemia   . Hypertension   . Spinal stenosis    Past Surgical History:  Past Surgical History:  Procedure Laterality Date  . ABDOMINAL HYSTERECTOMY    . BREAST BIOPSY Left    2016  benign   . BREAST LUMPECTOMY    . KNEE ARTHROSCOPY    . MASTECTOMY     right side  . TUBAL LIGATION        Social History:  reports that she has quit smoking. Her smoking use included cigarettes. She has a 10.00 pack-year smoking history. She has never used smokeless tobacco. She reports that she does not drink alcohol or use drugs. Family History:  Family History  Problem Relation Age of Onset  . Diabetes Mother   . Alzheimer's disease Mother   . Heart disease Father   . Hyperlipidemia Father   . Kidney disease Father   . Cancer Brother        lung  . Alzheimer's disease Brother   . Heart disease Brother   . Diabetes Sister   . Diabetes Brother   . Diabetes Brother      HOME MEDICATIONS: Allergies as of 12/13/2019      Reactions   Penicillins Swelling   Sulfa Antibiotics Swelling   Amlodipine Swelling   LE Swelling   Lisinopril Cough      Medication List       Accurate as of December 13, 2019 11:59 PM. If you have any questions, ask your nurse or doctor.        STOP taking these medications   allopurinol 300 MG tablet Commonly known as: ZYLOPRIM Stopped by: Donna Lorenzo  Donna Cumins, MD   cyclobenzaprine 10 MG tablet Commonly known as: FLEXERIL Stopped by: Donna Sciara, MD   Donna Fuller FlexTouch 200 UNIT/ML Sopn Generic drug: Insulin Degludec Stopped by: Donna Sciara, MD     TAKE these medications   acetaminophen 650 MG CR tablet Commonly known as: TYLENOL Take 650 mg by mouth daily.   ascorbic acid 500 MG tablet Commonly known as: VITAMIN C Take 500 mg by mouth daily.   aspirin 81 MG tablet Take 81 mg by mouth daily.   baclofen 10 MG tablet Commonly known as: LIORESAL Take 10 mg by mouth 3 (three) times daily as needed.   Biotin 1000 MCG Chew Chew by mouth.   blood glucose meter kit and supplies Kit Dispense based on patient and insurance preference. Use up to four times daily as directed. (FOR ICD-9 250.00, 250.01).    brimonidine 0.2 % ophthalmic solution Commonly known as: ALPHAGAN Place 1 drop into both eyes 2 (two) times a day.   ferrous sulfate 325 (65 FE) MG tablet Take 325 mg by mouth daily with breakfast.   furosemide 20 MG tablet Commonly known as: LASIX Take 20 mg by mouth daily.   gabapentin 300 MG capsule Commonly known as: NEURONTIN TAKE 1 CAPSULE BY MOUTH THREE TIMES DAILY ,  MAY  TAKE  AN  ADDITIONAL  CAPSULE  IN  THE  EVENING What changed: See the new instructions.   glipiZIDE 5 MG tablet Commonly known as: Glucotrol Take 1 tablet (5 mg total) by mouth daily before breakfast. Started by: Jeannetta Nap, CMA   glucose blood test strip Commonly known as: ONE TOUCH ULTRA TEST USE AS DIRECTED TO CHECK BLOOD SUGAR 2-3 TIMES A DAY Pt will need an OV for more refills   guaiFENesin-dextromethorphan 100-10 MG/5ML syrup Commonly known as: ROBITUSSIN DM Take 5 mLs by mouth every 4 (four) hours as needed for cough.   ketorolac 0.5 % ophthalmic solution Commonly known as: ACULAR Place 1 drop into the right eye 4 (four) times daily.   latanoprost 0.005 % ophthalmic solution Commonly known as: XALATAN Place 1 drop into both eyes daily.   lisinopril-hydrochlorothiazide 20-25 MG tablet Commonly known as: ZESTORETIC Take 1 tablet by mouth daily.   losartan 50 MG tablet Commonly known as: COZAAR Take 50 mg by mouth daily.   Magnesium 250 MG Tabs Take 1 tablet by mouth daily.   montelukast 10 MG tablet Commonly known as: SINGULAIR Take 10 mg by mouth at bedtime.   omeprazole 40 MG capsule Commonly known as: PRILOSEC Take 1 capsule (40 mg total) by mouth daily. Must keep appt on 12/06/16 with Dr. Ricky Ala Lancets 22Q Misc Use as instructed to check blood sugar 2-3 times a day.  DX E11.9   pioglitazone 15 MG tablet Commonly known as: ACTOS Take 15 mg by mouth daily.   pravastatin 20 MG tablet Commonly known as: PRAVACHOL TAKE 1 TABLET BY MOUTH ONCE  DAILY   timolol 0.5 % ophthalmic solution Commonly known as: TIMOPTIC Place 1 drop into both eyes 2 (two) times a day.   Trulicity 1.5 MG/5.0IB Sopn Generic drug: Dulaglutide Inject into the skin.   Vitamin D-3 125 MCG (5000 UT) Tabs Take 1 tablet by mouth daily.   zinc gluconate 50 MG tablet Take 50 mg by mouth daily.        ALLERGIES: Allergies  Allergen Reactions  . Penicillins Swelling  . Sulfa Antibiotics Swelling  . Amlodipine Swelling  LE Swelling  . Lisinopril Cough     REVIEW OF SYSTEMS: A comprehensive ROS was conducted with the patient and is negative except as per HPI and below:  Review of Systems  Constitutional: Negative for chills and fever.  Respiratory: Positive for shortness of breath. Negative for cough.        Occasionally   Cardiovascular: Positive for leg swelling.  Gastrointestinal: Negative for diarrhea and nausea.  Genitourinary: Positive for frequency.  Endo/Heme/Allergies: Positive for polydipsia.      OBJECTIVE:   VITAL SIGNS: BP 118/64 (BP Location: Left Arm, Patient Position: Sitting, Cuff Size: Normal)   Pulse 79   Temp 97.9 F (36.6 C)   Ht '5\' 2"'  (1.575 m)   Wt 202 lb 3.2 oz (91.7 kg)   SpO2 97%   BMI 36.98 kg/m    PHYSICAL EXAM:  General: Pt appears well and is in NAD  HEENT:  Eyes: External eye exam normal without stare, lid lag or exophthalmos.  EOM intact.    Neck: General: Supple without adenopathy or carotid bruits. Thyroid: Thyroid size normal.  No goiter or nodules appreciated. No thyroid bruit.  Lungs: Clear with good BS bilat with no rales, rhonchi, or wheezes  Heart: RRR with normal S1 and S2 and no gallops; no murmurs; no rub  Abdomen: Normoactive bowel sounds, soft, nontender, without masses or organomegaly palpable  Extremities:  Lower extremities - trace pretibial edema. No lesions.  Skin: Normal texture and temperature to palpation.   Neuro: MS is good with appropriate affect, pt is alert and Ox3     DATA REVIEWED:  Lab Results  Component Value Date   HGBA1C 7.9 (H) 06/01/2019   HGBA1C 7.0 07/19/2017   HGBA1C 7.0 (H) 04/06/2017   Lab Results  Component Value Date   MICROALBUR 0.4 04/21/2017   LDLCALC 77 05/31/2019   CREATININE 1.19 (H) 06/03/2019     Lab Results  Component Value Date   CHOL 140 05/31/2019   HDL 42 05/31/2019   LDLCALC 77 05/31/2019   LDLDIRECT 76.0 12/30/2016   TRIG 105 05/31/2019   CHOLHDL 3.3 05/31/2019       11/09/2019  A1c 8.8%  GFR 72     In office BG    235 mg/dL at [facility] 3 hours ago  ASSESSMENT / PLAN / RECOMMENDATIONS:   1) Type 2 Diabetes Mellitus, Poorly controlled, With neuropathic complications - Most recent A1c of 8.8 %. Goal A1c < 7.0 %.    Plan: GENERAL: I have discussed with the patient the pathophysiology of diabetes. We went over the natural progression of the disease. We talked about both insulin resistance and insulin deficiency. We stressed the importance of lifestyle changes including diet and exercise. I explained the complications associated with diabetes including retinopathy, nephropathy, neuropathy as well as increased risk of cardiovascular disease. We went over the benefit seen with glycemic control.    I explained to the patient that diabetic patients are at higher than normal risk for amputations.  We discussed add-on therapy. Pt agreed on starting Glipizide, discussed risk of hypoglycemia and weight gain.   Will not increase pioglitazone due to LE edema.   MEDICATIONS:  - Start Glipizide 5 mg before breakfast  - Continue Trulicity 1.5 mg weekly  - Continue Actos 15 mg daily   EDUCATION / INSTRUCTIONS:  BG monitoring instructions: Patient is instructed to check her blood sugars 2 times a day, fasting and supper .  Call [facility] Endocrinology clinic  if: BG persistently < 70 or > 300. . I reviewed the Rule of 15 for the treatment of hypoglycemia in detail with the patient.  Literature supplied.   2) Diabetic complications:   Eye: Does not have known diabetic retinopathy.   Neuro/ Feet: Does  have known diabetic peripheral neuropathy.  Renal: Patient does not have known baseline CKD. She is  on an ACEI/ARB at present.   3) Lipids: Patient is on pravastatin 20 mg daily     4) Hypertension: She is at goal of < 140/90 mmHg.     F/U in 3 months   Signed electronically by: Mack Guise, MD  [facility]  [facility] [address] [place],  [address] Phone: [phone] FAX: [phone]   CC: Donna Fuller, [place] [address] Phone: [phone]  Fax: [phone]    Return to Endocrinology clinic as below: Future Appointments  Date Time Provider [place]  02/07/2020 10:30 AM , Melanie Crazier, MD [facility] None

## 2019-12-13 NOTE — Telephone Encounter (Signed)
Correct pharmacy was updated in pt chart and refill for glipizide sent.

## 2019-12-13 NOTE — Telephone Encounter (Signed)
Patient called to advise that she no longer uses [facility]. She uses the [facility] on [address] for [employer] fills.  Please resend RX to her new pharmacy

## 2019-12-14 DIAGNOSIS — E1142 Type 2 diabetes mellitus with diabetic polyneuropathy: Secondary | ICD-10-CM | POA: Insufficient documentation

## 2019-12-14 DIAGNOSIS — E1165 Type 2 diabetes mellitus with hyperglycemia: Secondary | ICD-10-CM | POA: Insufficient documentation

## 2020-01-29 DIAGNOSIS — E119 Type 2 diabetes mellitus without complications: Secondary | ICD-10-CM | POA: Diagnosis not present

## 2020-01-29 DIAGNOSIS — E559 Vitamin D deficiency, unspecified: Secondary | ICD-10-CM | POA: Diagnosis not present

## 2020-01-29 DIAGNOSIS — Z1159 Encounter for screening for other viral diseases: Secondary | ICD-10-CM | POA: Diagnosis not present

## 2020-01-29 DIAGNOSIS — E7800 Pure hypercholesterolemia, unspecified: Secondary | ICD-10-CM | POA: Diagnosis not present

## 2020-01-29 DIAGNOSIS — I10 Essential (primary) hypertension: Secondary | ICD-10-CM | POA: Diagnosis not present

## 2020-02-07 ENCOUNTER — Ambulatory Visit: Payer: HMO | Admitting: Internal Medicine

## 2020-02-09 ENCOUNTER — Other Ambulatory Visit: Payer: Self-pay

## 2020-02-12 DIAGNOSIS — E113392 Type 2 diabetes mellitus with moderate nonproliferative diabetic retinopathy without macular edema, left eye: Secondary | ICD-10-CM | POA: Diagnosis not present

## 2020-02-12 DIAGNOSIS — H401132 Primary open-angle glaucoma, bilateral, moderate stage: Secondary | ICD-10-CM | POA: Diagnosis not present

## 2020-02-12 DIAGNOSIS — Z961 Presence of intraocular lens: Secondary | ICD-10-CM | POA: Diagnosis not present

## 2020-02-12 DIAGNOSIS — H35351 Cystoid macular degeneration, right eye: Secondary | ICD-10-CM | POA: Diagnosis not present

## 2020-02-13 ENCOUNTER — Encounter: Payer: Self-pay | Admitting: Internal Medicine

## 2020-02-13 ENCOUNTER — Other Ambulatory Visit: Payer: Self-pay

## 2020-02-13 ENCOUNTER — Ambulatory Visit (INDEPENDENT_AMBULATORY_CARE_PROVIDER_SITE_OTHER): Payer: HMO | Admitting: Internal Medicine

## 2020-02-13 VITALS — BP 132/62 | HR 63 | Temp 97.9°F | Ht 62.0 in | Wt 204.6 lb

## 2020-02-13 DIAGNOSIS — E1142 Type 2 diabetes mellitus with diabetic polyneuropathy: Secondary | ICD-10-CM | POA: Diagnosis not present

## 2020-02-13 NOTE — Patient Instructions (Signed)
-   Continue Glipizide 5 mg before breakfast  - Continue Trulicity 1.5 mg weekly  - Continue Actos 15 mg, 1 tablet with Breakfast      - Check sugar before breakfast and Supper time    - HOW TO TREAT LOW BLOOD SUGARS (Blood sugar LESS THAN 70 MG/DL)  Please follow the RULE OF 15 for the treatment of hypoglycemia treatment (when your (blood sugars are less than 70 mg/dL)    STEP 1: Take 15 grams of carbohydrates when your blood sugar is low, which includes:   3-4 GLUCOSE TABS  OR  3-4 OZ OF JUICE OR REGULAR SODA OR  ONE TUBE OF GLUCOSE GEL     STEP 2: RECHECK blood sugar in 15 MINUTES STEP 3: If your blood sugar is still low at the 15 minute recheck --> then, go back to STEP 1 and treat AGAIN with another 15 grams of carbohydrates.

## 2020-02-13 NOTE — Progress Notes (Signed)
Name: Donna Fuller  Age/ Sex: 79 y.o., female   MRN/ DOB: [id number], 02-Oct-1941     PCP: Sandi Mariscal, MD   Reason for Endocrinology Evaluation: Type 2 Diabetes Mellitus  Initial Endocrine Consultative Visit: 12/14/2019    PATIENT IDENTIFIER: [name] is a 79 y.o. female with a past medical history of T2DM, HTN and Dyslipidemia. The patient has followed with Endocrinology clinic since 12/14/2019 for consultative assistance with management of her diabetes.  DIABETIC HISTORY:  Donna Fuller was diagnosed with T2DM many years ago. She was on metformin due to low renal function, has been on Januvia, Farxia and insulin as well. Her hemoglobin A1c has ranged from 6.0 %in 2015, peaking at 8.8% in 2020.  On her initial visit to our clinic her A1c was 8.8%   , she was on Trulicity and pioglitazone, we added glipizide  SUBJECTIVE:   During the last visit (12/14/2019): A1c 8.8%, we continue Trulicity and pioglitazone, we started glipizide  Today (02/13/2020): Donna Fuller is here for follow-up on diabetes management. She checks her blood sugars 2 times daily, preprandial to breakfast and dinnertime.  The patient has not had hypoglycemic episodes since the last clinic visit. Otherwise, the patient has not required any recent emergency interventions for hypoglycemia and has not had recent hospitalizations secondary to hyper or hypoglycemic episodes.    Denies sob  Has occasional nausea but no diarrhea.  HOME DIABETES REGIMEN:  Glipizide 5 mg daily Trulicity 1.5 mg weekly Pioglitazone 15 mg daily    METER DOWNLOAD SUMMARY: Date range evaluated: 3/3-3/16/2021 Fingerstick Blood Glucose Tests = 24 Average Number Tests/Day = 1.7 Overall Mean FS Glucose = 166   BG Ranges: Low = 108 High = 257   Hypoglycemic Events/30 Days: BG < 50 = 0 Episodes of symptomatic severe hypoglycemia = 0   DIABETIC COMPLICATIONS: Microvascular complications:   Neuropathy    Denies: retinopathy , CKD  Last eye exam: Completed 4/21020  Macrovascular complications:    Denies: CAD, PVD, CVA    HISTORY:  Past Medical History:  Past Medical History:  Diagnosis Date  . Arthritis   . Breast cancer [facility]) 2007   Right breast, s/p mastectomy  . Diabetes mellitus without complication ([place])   . GERD (gastroesophageal reflux disease)   . Gout   . Hyperlipidemia   . Hypertension   . Spinal stenosis    Past Surgical History:  Past Surgical History:  Procedure Laterality Date  . ABDOMINAL HYSTERECTOMY    . BREAST BIOPSY Left    2016 benign   . BREAST LUMPECTOMY    . KNEE ARTHROSCOPY    . MASTECTOMY     right side  . TUBAL LIGATION      Social History:  reports that she has quit smoking. Her smoking use included cigarettes. She has a 10.00 pack-year smoking history. She has never used smokeless tobacco. She reports that she does not drink alcohol or use drugs. Family History:  Family History  Problem Relation Age of Onset  . Diabetes Mother   . Alzheimer's disease Mother   . Heart disease Father   . Hyperlipidemia Father   . Kidney disease Father   . Cancer Brother        lung  . Alzheimer's disease Brother   . Heart disease Brother   . Diabetes Sister   . Diabetes Brother   . Diabetes Brother      HOME MEDICATIONS: Allergies as of 02/13/2020  Reactions   Penicillins Swelling   Sulfa Antibiotics Swelling   Amlodipine Swelling   LE Swelling   Lisinopril Cough      Medication List       Accurate as of February 13, 2020 10:38 AM. If you have any questions, ask your nurse or doctor.        acetaminophen 650 MG CR tablet Commonly known as: TYLENOL Take 650 mg by mouth daily.   ascorbic acid 500 MG tablet Commonly known as: VITAMIN C Take 500 mg by mouth daily.   aspirin 81 MG tablet Take 81 mg by mouth daily.   baclofen 10 MG tablet Commonly known as: LIORESAL Take 10 mg by mouth 3 (three) times daily as needed.    Biotin 1000 MCG Chew Chew by mouth.   blood glucose meter kit and supplies Kit Dispense based on patient and insurance preference. Use up to four times daily as directed. (FOR ICD-9 250.00, 250.01).   brimonidine 0.2 % ophthalmic solution Commonly known as: ALPHAGAN Place 1 drop into both eyes 2 (two) times a day.   ferrous sulfate 325 (65 FE) MG tablet Take 325 mg by mouth daily with breakfast.   furosemide 20 MG tablet Commonly known as: LASIX Take 20 mg by mouth daily.   gabapentin 300 MG capsule Commonly known as: NEURONTIN TAKE 1 CAPSULE BY MOUTH THREE TIMES DAILY ,  MAY  TAKE  AN  ADDITIONAL  CAPSULE  IN  THE  EVENING What changed: See the new instructions.   glipiZIDE 5 MG tablet Commonly known as: Glucotrol Take 1 tablet (5 mg total) by mouth daily before breakfast.   glucose blood test strip Commonly known as: ONE TOUCH ULTRA TEST USE AS DIRECTED TO CHECK BLOOD SUGAR 2-3 TIMES A DAY Pt will need an OV for more refills   ketorolac 0.5 % ophthalmic solution Commonly known as: ACULAR Place 1 drop into the right eye 4 (four) times daily.   latanoprost 0.005 % ophthalmic solution Commonly known as: XALATAN Place 1 drop into both eyes daily.   losartan 50 MG tablet Commonly known as: COZAAR Take 50 mg by mouth daily.   Magnesium 250 MG Tabs Take 1 tablet by mouth daily.   montelukast 10 MG tablet Commonly known as: SINGULAIR Take 10 mg by mouth at bedtime.   omeprazole 40 MG capsule Commonly known as: PRILOSEC Take 1 capsule (40 mg total) by mouth daily. Must keep appt on 12/06/16 with Dr. Ricky Ala Lancets 62H Misc Use as instructed to check blood sugar 2-3 times a day.  DX E11.9   pioglitazone 15 MG tablet Commonly known as: ACTOS Take 15 mg by mouth daily.   pravastatin 20 MG tablet Commonly known as: PRAVACHOL TAKE 1 TABLET BY MOUTH ONCE DAILY   timolol 0.5 % ophthalmic solution Commonly known as: TIMOPTIC Place 1 drop into  both eyes 2 (two) times a day.   Trulicity 1.5 UT/6.5YY Sopn Generic drug: Dulaglutide Inject into the skin.   Vitamin D-3 125 MCG (5000 UT) Tabs Take 1 tablet by mouth daily.   zinc gluconate 50 MG tablet Take 50 mg by mouth daily.        OBJECTIVE:   Vital Signs: BP 132/62 (BP Location: Left Arm, Patient Position: Sitting, Cuff Size: Large)   Pulse 63   Temp 97.9 F (36.6 C)   Ht _0  (1.575 m)   Wt 204 lb 9.6 oz (92.8 kg)   SpO2 98%  BMI 37.42 kg/m   Wt Readings from Last 3 Encounters:  02/13/20 204 lb 9.6 oz (92.8 kg)  12/13/19 202 lb 3.2 oz (91.7 kg)  05/31/19 201 lb 4.5 oz (91.3 kg)     Exam: General: Pt appears well and is in NAD  Lungs: Clear with good BS bilat with no rales, rhonchi, or wheezes  Heart: RRR with normal S1 and S2 and no gallops; no murmurs; no rub  Abdomen: Normoactive bowel sounds, soft, nontender, without masses or organomegaly palpable  Extremities: No pretibial edema.  Skin: Normal texture and temperature to palpation.  Neuro: MS is good with appropriate affect, pt is alert and Ox3    DM foot exam:   02/13/2020  The skin of the feet is intact without sores or ulcerations. The pedal pulses are 2+ on right and 2+ on left. The sensation is intact to a screening 5.07, 10 gram monofilament bilaterally    DATA REVIEWED:  Lab Results  Component Value Date   HGBA1C 7.9 (H) 06/01/2019   HGBA1C 7.0 07/19/2017   HGBA1C 7.0 (H) 04/06/2017   Lab Results  Component Value Date   MICROALBUR 0.4 04/21/2017   LDLCALC 77 05/31/2019   CREATININE 1.19 (H) 06/03/2019   Lab Results  Component Value Date   MICRALBCREAT 10 04/21/2017     Lab Results  Component Value Date   CHOL 140 05/31/2019   HDL 42 05/31/2019   LDLCALC 77 05/31/2019   LDLDIRECT 76.0 12/30/2016   TRIG 105 05/31/2019   CHOLHDL 3.3 05/31/2019         ASSESSMENT / PLAN / RECOMMENDATIONS:   1) Type 2 Diabetes Mellitus, optimally controlled, With neuropathic  complications - Most recent A1c of 6.8 %. Goal A1c <7.0%.    -Patient states she had an A1c at 6.8% at PCPs office, these records are not available at this time -In review of her meter download, her BG's are acceptable without hypoglycemia.  -Patient is tolerating her diabetes regimen well without any side effects, we will not be making any changes today    MEDICATIONS:  Continue glipizide 5 mg daily before breakfast  Continue pioglitazone 15 mg, 1 tablet daily  Continue Trulicity 1.5 mg weekly  EDUCATION / INSTRUCTIONS:  BG monitoring instructions: Patient is instructed to check her blood sugars 2 times a day, fasting and suppertime.  Call [place] [facility] clinic if: BG persistently < 70 or > 300. . I reviewed the Rule of 15 for the treatment of hypoglycemia in detail with the patient. Literature supplied.    2) Diabetic complications:   Eye: Does not have known diabetic retinopathy.   Neuro/ Feet: Does  have known diabetic peripheral neuropathy.  Renal: Patient does not have known baseline CKD. She is  on an ACEI/ARB at present.  F/U in 4 months   Signed electronically by: Mack Guise, MD  [facility]  [facility] [address] [place], [place] [address] Phone: [phone] FAX: [phone]   CC: Sandi Mariscal, [place] [address] Phone: [phone]  Fax: [phone]  Return to Endocrinology clinic as below: Future Appointments  Date Time Provider [place]  03/11/2020 10:00 AM Lavonia Drafts, MD [facility] None  06/18/2020 10:30 AM Brucha Ahlquist, Melanie Crazier, MD [facility] None

## 2020-02-27 ENCOUNTER — Other Ambulatory Visit: Payer: Self-pay | Admitting: General Practice

## 2020-02-27 NOTE — Patient Outreach (Signed)
Client is newly enrolled in the Special Needs Plan program with Type II Diabetes. Recent stated A1C is 6.8%, last A1C in the documentation is 7.9% from July 2020. Individualized Care Plan (ICP) completed with information from the Health Risk Assessment on file, unable to reach client after three telephone call attempts. Client also has a history of Hypertension and Hyperlipidemia, one acute care inpatient stay in June of 2020. Will send an introductory letter with ICP to the primary provider and client, along with educational materials. Assigned RN Care Coordinator will follow up within 3 months.

## 2020-03-11 ENCOUNTER — Encounter: Payer: Self-pay | Admitting: Obstetrics & Gynecology

## 2020-03-11 ENCOUNTER — Other Ambulatory Visit: Payer: Self-pay

## 2020-03-11 ENCOUNTER — Ambulatory Visit (INDEPENDENT_AMBULATORY_CARE_PROVIDER_SITE_OTHER): Payer: HMO | Admitting: Obstetrics & Gynecology

## 2020-03-11 VITALS — BP 151/53 | HR 60 | Ht 62.0 in | Wt 207.0 lb

## 2020-03-11 DIAGNOSIS — R310 Gross hematuria: Secondary | ICD-10-CM

## 2020-03-11 DIAGNOSIS — I10 Essential (primary) hypertension: Secondary | ICD-10-CM

## 2020-03-11 LAB — POCT URINALYSIS DIPSTICK
Glucose, UA: NEGATIVE
Ketones, UA: NEGATIVE
Leukocytes, UA: NEGATIVE
Nitrite, UA: NEGATIVE
Protein, UA: NEGATIVE
Spec Grav, UA: 1.01 (ref 1.010–1.025)
pH, UA: 6 (ref 5.0–8.0)

## 2020-03-11 NOTE — Progress Notes (Signed)
Pt states she experienced some vaginal bleeding in Dec and Feb.

## 2020-03-11 NOTE — Patient Instructions (Signed)
Hematuria, Adult Hematuria is blood in the urine. Blood may be visible in the urine, or it may be identified with a test. This condition can be caused by infections of the bladder, urethra, kidney, or prostate. Other possible causes include:  Kidney stones.  Cancer of the urinary tract.  Too much calcium in the urine.  Conditions that are passed from parent to child (inherited conditions).  Exercise that requires a lot of energy. Infections can usually be treated with medicine, and a kidney stone usually will pass through your urine. If neither of these is the cause of your hematuria, more tests may be needed to identify the cause of your symptoms. It is very important to tell your health care provider about any blood in your urine, even if it is painless or the blood stops without treatment. Blood in the urine, when it happens and then stops and then happens again, can be a symptom of a very serious condition, including cancer. There is no pain in the initial stages of many urinary cancers. Follow these instructions at home: Medicines  Take over-the-counter and prescription medicines only as told by your health care provider.  If you were prescribed an antibiotic medicine, take it as told by your health care provider. Do not stop taking the antibiotic even if you start to feel better. Eating and drinking  Drink enough fluid to keep your urine clear or pale yellow. It is recommended that you drink 3-4 quarts (2.8-3.8 L) a day. If you have been diagnosed with an infection, it is recommended that you drink cranberry juice in addition to large amounts of water.  Avoid caffeine, tea, and carbonated beverages. These tend to irritate the bladder.  Avoid alcohol because it may irritate the prostate (men). General instructions  If you have been diagnosed with a kidney stone, follow your health care provider's instructions about straining your urine to catch the stone.  Empty your bladder  often. Avoid holding urine for long periods of time.  If you are female: ? After a bowel movement, wipe from front to back and use each piece of toilet paper only once. ? Empty your bladder before and after sex.  Pay attention to any changes in your symptoms. Tell your health care provider about any changes or any new symptoms.  It is your responsibility to get your test results. Ask your health care provider, or the department performing the test, when your results will be ready.  Keep all follow-up visits as told by your health care provider. This is important. Contact a health care provider if:  You develop back pain.  You have a fever.  You have nausea or vomiting.  Your symptoms do not improve after 3 days.  Your symptoms get worse. Get help right away if:  You develop severe vomiting and are unable take medicine without vomiting.  You develop severe pain in your back or abdomen even though you are taking medicine.  You pass a large amount of blood in your urine.  You pass blood clots in your urine.  You feel very weak or like you might faint.  You faint. Summary  Hematuria is blood in the urine. It has many possible causes.  It is very important that you tell your health care provider about any blood in your urine, even if it is painless or the blood stops without treatment.  Take over-the-counter and prescription medicines only as told by your health care provider.  Drink enough fluid to keep   your urine clear or pale yellow. This information is not intended to replace advice given to you by your health care provider. Make sure you discuss any questions you have with your health care provider. Document Revised: 04/12/2019 Document Reviewed: 12/19/2016 Elsevier Patient Education  2020 Elsevier Inc.  

## 2020-03-11 NOTE — Progress Notes (Signed)
History:  79 y.o. G3P3003 here today for eval of passage of blood clots. Pt reports that on Dec 20, Feb 5th and Feb 27th, she went to void and noted blood clots when she wiped. Pt is s/p hysterectomy. She denies pain at the time. She denies h/o renal lithiasis.    The following portions of the patient's history were reviewed and updated as appropriate: allergies, current medications, past family history, past medical history, past social history, past surgical history and problem list.  Review of Systems:  Pertinent items are noted in HPI.    Objective:  Physical Exam Blood pressure (!) 151/53, pulse 60, height 5\' 2"  (1.575 m), weight 207 lb 0.6 oz (93.9 kg).  CONSTITUTIONAL: Well-developed, well-nourished female in no acute distress.  HENT:  Normocephalic, atraumatic EYES: Conjunctivae and EOM are normal. No scleral icterus.  NECK: Normal range of motion SKIN: Skin is warm and dry. No rash noted. Not diaphoretic.No pallor. [place]: Alert and oriented to person, place, and time. Normal coordination.  Abd: Soft, nontender and nondistended Pelvic: Normal appearing external genitalia; normal appearing vaginal mucosa and cervix.  Normal discharge.  Small uterus, no other palpable masses, no uterine or adnexal tenderness  Labs and Imaging UA: trace hematuria.   BMP Latest Ref Rng & Units 06/03/2019 06/02/2019 06/01/2019  Glucose 70 - 99 mg/dL 124(H) 125(H) 136(H)  BUN 8 - 23 mg/dL 21 23 21   Creatinine 0.44 - 1.00 mg/dL 1.19(H) 1.29(H) 1.41(H)  Sodium 135 - 145 mmol/L 139 139 139  Potassium 3.5 - 5.1 mmol/L 3.8 4.0 3.9  Chloride 98 - 111 mmol/L 100 103 102  CO2 22 - 32 mmol/L 27 26 27   Calcium 8.9 - 10.3 mg/dL 9.0 8.7(L) 8.7(L)   Assessment & Plan:  Gross hematuria- etiology unknown. No evidence of infection. Some CRI however, this appears to be improving and would not account for gross hematuria. Pt needs CT but, will refer to Urology and allow them to order the test that will best assit  them.   Hypertension- on meds. Stable.    Referral to Urology for eval and management of gross hematuria.  F/u in 3 months or sooner prn  Total face-to-face time with patient was 20 min.  Greater than 50% was spent in counseling and coordination of care with the patient.   Donna Fuller, M.D., Cherlynn June

## 2020-03-11 NOTE — Addendum Note (Signed)
Addended by: Wendelyn Breslow L on: 03/11/2020 10:55 AM   Modules accepted: Orders

## 2020-03-13 LAB — URINE CULTURE

## 2020-03-26 ENCOUNTER — Other Ambulatory Visit (INDEPENDENT_AMBULATORY_CARE_PROVIDER_SITE_OTHER): Payer: Self-pay | Admitting: Ophthalmology

## 2020-03-26 DIAGNOSIS — E113211 Type 2 diabetes mellitus with mild nonproliferative diabetic retinopathy with macular edema, right eye: Secondary | ICD-10-CM | POA: Diagnosis not present

## 2020-03-26 DIAGNOSIS — Z961 Presence of intraocular lens: Secondary | ICD-10-CM | POA: Diagnosis not present

## 2020-03-26 DIAGNOSIS — H524 Presbyopia: Secondary | ICD-10-CM | POA: Diagnosis not present

## 2020-03-26 DIAGNOSIS — H04123 Dry eye syndrome of bilateral lacrimal glands: Secondary | ICD-10-CM | POA: Diagnosis not present

## 2020-03-26 DIAGNOSIS — H401134 Primary open-angle glaucoma, bilateral, indeterminate stage: Secondary | ICD-10-CM | POA: Diagnosis not present

## 2020-03-28 ENCOUNTER — Encounter: Payer: Self-pay | Admitting: *Deleted

## 2020-03-28 ENCOUNTER — Other Ambulatory Visit: Payer: Self-pay | Admitting: *Deleted

## 2020-03-28 NOTE — Patient Outreach (Signed)
[facility]) Care Management Chronic Special Needs Program  03/28/2020  Name: MAILEEN MARKLEY DOB: [birth year]  MRN: [id number]  Ms. Saraphina Agent is enrolled in a chronic special needs plan for Diabetes. Chronic Care Management Coordinator telephoned client to review health risk assessment and to develop individualized care plan.  Introduced the chronic care management program, importance of client participation, and taking their care plan to all provider appointments and inpatient facilities.  Reviewed the transition of care process and possible referral to community care management.  Subjective:  Client reports she lives with her spouse and daughter, is independent in almost all aspects of care, does drive "some" and spouse and daughter also assist with transportation.  Client reports " I'm okay now with affording my medications"  Client reports she checks blood pressure " on occasion" but does not check every day.  Client states she did not receive HTA calendar but would like one and may start recording blood sugar and blood pressure in the calendar.  Client states on Health Risk Assessment goal "very worried about diabetes"  Client further states her goal is "don't want by blood sugar to be up and down".  Client reports she had recent [place] completed at 6.9 which is improvement, reports fasting ranges 170-200's, client reports she does work closely with her primary care provider and endocrinologist, client reports she tries to be very careful about what she eats and hopes she can get back to exercising.  Client states she is to see urologist tomorrow for hematuria, saw gynecologist and referred to urology.    Goals Addressed            This Visit's Progress   . "Don't want blood sugar to be up and down" (pt-stated)       Continue to check blood sugar 2-3 times per day or as ordered by your health care provider Continue to record blood sugar and take with you to doctor's  appointment Continue to be mindful of carbohydrate intake, avoiding concentrated sweets Please talk with your health care provider about any concerns RN care manager mailed EMMI education article " Diabetes care checklist"    . Client understands the importance of follow-up with providers by attending scheduled visits   On track    Review of medical record indicates client has completed 9 office visits with primary care provider in 2020 Call and schedule a yearly physical and follow up visit as recommended by your health care provider.     . COMPLETED: Client will report abillity to obtain Medications within 1 month       Client reported on HRA "sometimes unable to afford medications due to the cost". Client states she is now able to afford her medications     . COMPLETED: Client will use Assistive Devices as needed and verbalize understanding of device use       Client reports she walks safely with cane and has had no falls Keep pathways clear Always ask for assistance as needed    . Client will verbalize knowledge of self management of Hypertension as evidences by BP reading of 140/90 or less; or as defined by provider       Take blood pressure medications as prescribed. Plan to check blood pressure regularly and take results to your health care provider appointments. Plan to follow a low salt diet. Increase activity as tolerated. Follow up with your health care provider as recommended. Please ask your health care provider " what is my  target blood pressure range". Blood pressure 151/53 on 03/11/20    . HEMOGLOBIN A1C < 7.0       Per medical record review Hgb AIC completed on 06/01/19  7.9 and client reports checked 01/29/20  6.9 Diabetes self management actions as follows- Continue to keep your follow up appointments with your provider and have lab work completed as recommended. Monitor glucose (blood sugar) per health care provider recommendation. Check feet daily Visit health care  provider every 3-6 months as directed. It is important to have your Hgb AIC checked every 3-6 months (every 6 months if you are at goal and every 3 months if you are not at goal). Eye exam yearly Carbohydrate controlled meal planning Take diabetes medications as prescribed by health care provider Physical activity     . Maintain timely refills of diabetic medication as prescribed within the year .   On track    Take medications as prescribed. Follow up with your health care provider if you have any questions. Please contact your assigned RN care manager if you have difficulty obtaining medications. Review of electronic medical record medication dispense report indicates client maintains timely refills of diabetic medications.     . Obtain annual  Lipid Profile, LDL-C   On track    Per medical record review, Lipid profile completed on 05/31/19 and 01/29/20 per client LDL= 77 The goal for LDL is less than 70mg /dl as you are at high risk for complications. Try to avoid saturated fats, trans-fats and eat more fiber. Continue to follow up with your health care provider for any needed lab work.      . Obtain Annual Eye (retinal)  Exam    On track    Per client report, eye exam completed 03/26/20 Plan to have dilated eye exam every year Plan to schedule your eye exam yearly     . Obtain Annual Foot Exam   On track    Client states provider checks feet  times per year. Foot exam completed on 02/13/20  (per client report) Your doctor should check your feet at least once a year. Plan to schedule a foot exam with your health care provider once every year. Check your skin and feet daily for cuts, bruises, redness, blisters or sore.     . Obtain annual screen for micro albuminuria (urine) , nephropathy (kidney problems)       Per medical record review, microalbuminuria completed on 04/21/17, client reports completed in 2020 Continue to obtain yearly physicals and lab checks as recommended by your  health care provider. It is important for your health care provider to check your urine for protein at least once every year.     Illa Level Hemoglobin A1C at least 2 times per year       AIC completed 06/01/19 and 01/29/20 Continue to follow up with health care provider for any needed/ recommended lab work    . Visit Primary Care Provider or Endocrinologist at least 2 times per year    On track    Client saw primary care provider >2 times in 2020 Client saw endocrinologist for 2 visits in 2021 Continue to follow up with health care providers as needed       Plan:  RN care manager faxed today's note and individualized care plan to primary care provider, mailed successful outreach letter to client with individualized care plan, consent form, HTA calendar and EMMI education article.  Chronic care management coordination will outreach in:  9-12  months.     Kassie Mends Nursing/RN Coord [facility] Case Manager, C-SNP  [phone]

## 2020-03-29 DIAGNOSIS — R358 Other polyuria: Secondary | ICD-10-CM | POA: Diagnosis not present

## 2020-03-29 DIAGNOSIS — R339 Retention of urine, unspecified: Secondary | ICD-10-CM | POA: Diagnosis not present

## 2020-03-29 DIAGNOSIS — R310 Gross hematuria: Secondary | ICD-10-CM | POA: Diagnosis not present

## 2020-04-04 DIAGNOSIS — N3592 Unspecified urethral stricture, female: Secondary | ICD-10-CM | POA: Diagnosis not present

## 2020-04-04 DIAGNOSIS — R310 Gross hematuria: Secondary | ICD-10-CM | POA: Diagnosis not present

## 2020-04-30 DIAGNOSIS — Z1159 Encounter for screening for other viral diseases: Secondary | ICD-10-CM | POA: Diagnosis not present

## 2020-04-30 DIAGNOSIS — I10 Essential (primary) hypertension: Secondary | ICD-10-CM | POA: Diagnosis not present

## 2020-04-30 DIAGNOSIS — E7800 Pure hypercholesterolemia, unspecified: Secondary | ICD-10-CM | POA: Diagnosis not present

## 2020-04-30 DIAGNOSIS — E559 Vitamin D deficiency, unspecified: Secondary | ICD-10-CM | POA: Diagnosis not present

## 2020-04-30 DIAGNOSIS — E119 Type 2 diabetes mellitus without complications: Secondary | ICD-10-CM | POA: Diagnosis not present

## 2020-05-01 DIAGNOSIS — R072 Precordial pain: Secondary | ICD-10-CM | POA: Diagnosis not present

## 2020-05-03 DIAGNOSIS — R339 Retention of urine, unspecified: Secondary | ICD-10-CM | POA: Diagnosis not present

## 2020-05-03 DIAGNOSIS — R358 Other polyuria: Secondary | ICD-10-CM | POA: Diagnosis not present

## 2020-05-03 DIAGNOSIS — R310 Gross hematuria: Secondary | ICD-10-CM | POA: Diagnosis not present

## 2020-05-06 ENCOUNTER — Other Ambulatory Visit: Payer: Self-pay | Admitting: Internal Medicine

## 2020-05-06 ENCOUNTER — Telehealth: Payer: Self-pay | Admitting: Internal Medicine

## 2020-05-06 ENCOUNTER — Other Ambulatory Visit: Payer: Self-pay

## 2020-05-06 DIAGNOSIS — I10 Essential (primary) hypertension: Secondary | ICD-10-CM | POA: Diagnosis not present

## 2020-05-06 DIAGNOSIS — E1142 Type 2 diabetes mellitus with diabetic polyneuropathy: Secondary | ICD-10-CM

## 2020-05-06 DIAGNOSIS — R079 Chest pain, unspecified: Secondary | ICD-10-CM | POA: Diagnosis not present

## 2020-05-06 MED ORDER — GLIPIZIDE 5 MG PO TABS: 5.0000 mg | ORAL_TABLET | Freq: Every day | ORAL | 1 refills | Status: DC

## 2020-05-06 NOTE — Telephone Encounter (Signed)
Medication Refill Request  Did you call your pharmacy and request this refill first? No-pt states will call PHARM 1st in the future  If patient has not contacted pharmacy first, instruct them to do so for future refills.   Remind them that contacting the pharmacy for their refill is the quickest method to get the refill.   Refill policy also stated that it will take anywhere between 24-72 hours to receive the refill.    Name of medication?  glipiZIDE (GLUCOTROL) 5 MG tablet  Is this a 90 day supply? Yes  Name and location of pharmacy?   [facility] [place], [place] [address] Phone:  [phone]  Fax:  [phone]        Is the request for diabetes test strips? No  If yes, what brand?N/A

## 2020-05-13 DIAGNOSIS — R319 Hematuria, unspecified: Secondary | ICD-10-CM | POA: Diagnosis not present

## 2020-05-13 DIAGNOSIS — R310 Gross hematuria: Secondary | ICD-10-CM | POA: Diagnosis not present

## 2020-05-13 DIAGNOSIS — I700 Atherosclerosis of aorta: Secondary | ICD-10-CM | POA: Diagnosis not present

## 2020-05-13 DIAGNOSIS — K5790 Diverticulosis of intestine, part unspecified, without perforation or abscess without bleeding: Secondary | ICD-10-CM | POA: Diagnosis not present

## 2020-05-15 DIAGNOSIS — R600 Localized edema: Secondary | ICD-10-CM | POA: Diagnosis not present

## 2020-05-15 DIAGNOSIS — E119 Type 2 diabetes mellitus without complications: Secondary | ICD-10-CM | POA: Diagnosis not present

## 2020-05-24 DIAGNOSIS — Z794 Long term (current) use of insulin: Secondary | ICD-10-CM | POA: Diagnosis not present

## 2020-05-24 DIAGNOSIS — H04123 Dry eye syndrome of bilateral lacrimal glands: Secondary | ICD-10-CM | POA: Diagnosis not present

## 2020-05-24 DIAGNOSIS — E11311 Type 2 diabetes mellitus with unspecified diabetic retinopathy with macular edema: Secondary | ICD-10-CM | POA: Diagnosis not present

## 2020-05-24 DIAGNOSIS — H401132 Primary open-angle glaucoma, bilateral, moderate stage: Secondary | ICD-10-CM | POA: Diagnosis not present

## 2020-05-24 LAB — HM DIABETES EYE EXAM

## 2020-05-30 DIAGNOSIS — R310 Gross hematuria: Secondary | ICD-10-CM | POA: Diagnosis not present

## 2020-06-18 ENCOUNTER — Ambulatory Visit: Payer: HMO | Admitting: Internal Medicine

## 2020-06-24 ENCOUNTER — Other Ambulatory Visit: Payer: Self-pay

## 2020-06-24 ENCOUNTER — Ambulatory Visit (INDEPENDENT_AMBULATORY_CARE_PROVIDER_SITE_OTHER): Payer: HMO | Admitting: Internal Medicine

## 2020-06-24 ENCOUNTER — Encounter: Payer: Self-pay | Admitting: Internal Medicine

## 2020-06-24 VITALS — BP 122/62 | HR 71 | Ht 62.0 in | Wt 209.6 lb

## 2020-06-24 DIAGNOSIS — E1165 Type 2 diabetes mellitus with hyperglycemia: Secondary | ICD-10-CM

## 2020-06-24 DIAGNOSIS — E1142 Type 2 diabetes mellitus with diabetic polyneuropathy: Secondary | ICD-10-CM | POA: Diagnosis not present

## 2020-06-24 MED ORDER — TRULICITY 3 MG/0.5ML ~~LOC~~ SOAJ
3.0000 mg | SUBCUTANEOUS | 3 refills | Status: DC
Start: 2020-06-24 — End: 2021-10-16

## 2020-06-24 NOTE — Patient Instructions (Addendum)
-   Increase trulicity 3 mg weekly  - Continue Glipizide 5 mg before breakfast  - Continue Actos 15 mg, 1 tablet with Breakfast       - HOW TO TREAT LOW BLOOD SUGARS (Blood sugar LESS THAN 70 MG/DL)  Please follow the RULE OF 15 for the treatment of hypoglycemia treatment (when your (blood sugars are less than 70 mg/dL)    STEP 1: Take 15 grams of carbohydrates when your blood sugar is low, which includes:   3-4 GLUCOSE TABS  OR  3-4 OZ OF JUICE OR REGULAR SODA OR  ONE TUBE OF GLUCOSE GEL     STEP 2: RECHECK blood sugar in 15 MINUTES STEP 3: If your blood sugar is still low at the 15 minute recheck --> then, go back to STEP 1 and treat AGAIN with another 15 grams of carbohydrates.

## 2020-06-24 NOTE — Progress Notes (Signed)
Name: Donna Fuller  Age/ Sex: 79 y.o., female   MRN/ DOB: [id number], [birth year]     PCP: Sandi Mariscal, MD   Reason for Endocrinology Evaluation: Type 2 Diabetes Mellitus  Initial Endocrine Consultative Visit: 12/14/2019    PATIENT IDENTIFIER: [name] is a 79 y.o. female with a past medical history of T2DM, HTN and Dyslipidemia. The patient has followed with Endocrinology clinic since 12/14/2019 for consultative assistance with management of her diabetes.  DIABETIC HISTORY:  Donna Fuller was diagnosed with T2DM many years ago. She was on metformin due to low renal function, has been on Januvia, Farxia and insulin as well. Her hemoglobin A1c has ranged from 6.0 %in 2015, peaking at 8.8% in 2020.  On her initial visit to our clinic her A1c was 8.8%   , she was on Trulicity and pioglitazone, we added glipizide  SUBJECTIVE:   During the last visit (02/13/2020): A1c 6.8%, we continue Trulicity,  Pioglitazone, and glipizide  Today (06/24/2020): Donna Fuller is here for follow-up on diabetes management. She checks her blood sugars 2 times daily, preprandial to breakfast and dinnertime.  The patient has not had hypoglycemic episodes since the last clinic visit. She has been stressed lately. Granddaughter moved in with 2 babies.   Denies sob  Denies nausea and  diarrhea.  HOME DIABETES REGIMEN:  Glipizide 5 mg daily Trulicity 1.5 mg weekly Pioglitazone 15 mg daily    METER DOWNLOAD SUMMARY: Date range evaluated: 7/13-7/26/2021 Fingerstick Blood Glucose Tests = 16 Average Number Tests/Day = 1.1 Overall Mean FS Glucose = 192   BG Ranges: Low = 140 High = 318   Hypoglycemic Events/30 Days: BG < 50 = 0 Episodes of symptomatic severe hypoglycemia = 0   DIABETIC COMPLICATIONS: Microvascular complications:   Neuropathy   Denies: retinopathy , CKD  Last eye exam: Completed 02/2020  Macrovascular complications:    Denies: CAD, PVD, CVA    HISTORY:    Past Medical History:  Past Medical History:  Diagnosis Date  . Arthritis   . Breast cancer [facility]) 2007   Right breast, s/p mastectomy  . Diabetes mellitus without complication ([place])   . GERD (gastroesophageal reflux disease)   . Gout   . Hyperlipidemia   . Hypertension   . Spinal stenosis    Past Surgical History:  Past Surgical History:  Procedure Laterality Date  . ABDOMINAL HYSTERECTOMY    . BREAST BIOPSY Left    2016 benign   . BREAST LUMPECTOMY    . KNEE ARTHROSCOPY    . MASTECTOMY     right side  . TUBAL LIGATION      Social History:  reports that she has quit smoking. Her smoking use included cigarettes. She has a 10.00 pack-year smoking history. She has never used smokeless tobacco. She reports that she does not drink alcohol and does not use drugs. Family History:  Family History  Problem Relation Age of Onset  . Diabetes Mother   . Alzheimer's disease Mother   . Heart disease Father   . Hyperlipidemia Father   . Kidney disease Father   . Cancer Brother        lung  . Alzheimer's disease Brother   . Heart disease Brother   . Diabetes Sister   . Diabetes Brother   . Diabetes Brother      HOME MEDICATIONS: Allergies as of 06/24/2020      Reactions   Penicillins Swelling   Sulfa Antibiotics Swelling  Amlodipine Swelling   LE Swelling   Lisinopril Cough      Medication List       Accurate as of June 24, 2020  3:16 PM. If you have any questions, ask your nurse or doctor.        STOP taking these medications   Trulicity 1.5 IN/8.6VE Sopn Generic drug: Dulaglutide Replaced by: Trulicity 3 HM/0.9OB Sopn Stopped by: Dorita Sciara, MD     TAKE these medications   acetaminophen 650 MG CR tablet Commonly known as: TYLENOL Take 650 mg by mouth daily.   ascorbic acid 500 MG tablet Commonly known as: VITAMIN C Take 500 mg by mouth daily.   aspirin 81 MG tablet Take 81 mg by mouth daily.   baclofen 10 MG tablet Commonly known as:  LIORESAL Take 10 mg by mouth 3 (three) times daily as needed.   Biotin 1000 MCG Chew Chew by mouth.   blood glucose meter kit and supplies Kit Dispense based on patient and insurance preference. Use up to four times daily as directed. (FOR ICD-9 250.00, 250.01).   brimonidine 0.2 % ophthalmic solution Commonly known as: ALPHAGAN Place 1 drop into both eyes 2 (two) times a day.   ferrous sulfate 325 (65 FE) MG tablet Take 325 mg by mouth daily with breakfast.   furosemide 20 MG tablet Commonly known as: LASIX Take 20 mg by mouth daily.   gabapentin 300 MG capsule Commonly known as: NEURONTIN TAKE 1 CAPSULE BY MOUTH THREE TIMES DAILY ,  MAY  TAKE  AN  ADDITIONAL  CAPSULE  IN  THE  EVENING What changed: See the new instructions.   glipiZIDE 5 MG tablet Commonly known as: GLUCOTROL TAKE 1 TABLET(5 MG) BY MOUTH DAILY BEFORE BREAKFAST   glucose blood test strip Commonly known as: ONE TOUCH ULTRA TEST USE AS DIRECTED TO CHECK BLOOD SUGAR 2-3 TIMES A DAY Pt will need an OV for more refills   ketorolac 0.5 % ophthalmic solution Commonly known as: ACULAR INSTILL 1 DROP IN RIGHT EYE FOUR TIMES DAILY   latanoprost 0.005 % ophthalmic solution Commonly known as: XALATAN Place 1 drop into both eyes daily.   losartan 50 MG tablet Commonly known as: COZAAR Take 50 mg by mouth daily.   losartan-hydrochlorothiazide 50-12.5 MG tablet Commonly known as: HYZAAR Take 1 tablet by mouth daily.   Magnesium 250 MG Tabs Take 1 tablet by mouth daily.   metoprolol succinate 25 MG 24 hr tablet Commonly known as: TOPROL-XL Take 25 mg by mouth daily.   montelukast 10 MG tablet Commonly known as: SINGULAIR Take 10 mg by mouth at bedtime.   omeprazole 40 MG capsule Commonly known as: PRILOSEC Take 1 capsule (40 mg total) by mouth daily. Must keep appt on 12/06/16 with Dr. Ricky Ala Lancets 09G Misc Use as instructed to check blood sugar 2-3 times a day.  DX E11.9     pioglitazone 15 MG tablet Commonly known as: ACTOS Take 15 mg by mouth daily.   pravastatin 20 MG tablet Commonly known as: PRAVACHOL TAKE 1 TABLET BY MOUTH ONCE DAILY   timolol 0.5 % ophthalmic solution Commonly known as: TIMOPTIC Place 1 drop into both eyes 2 (two) times a day.   Trulicity 3 GE/3.6OQ Sopn Generic drug: Dulaglutide Inject 0.5 mLs (3 mg total) as directed once a week. Replaces: Trulicity 1.5 HU/7.6LY Sopn Started by: Dorita Sciara, MD   Vitamin D-3 125 MCG (5000 UT) Tabs Take 1 tablet  by mouth daily.   zinc gluconate 50 MG tablet Take 50 mg by mouth daily.        OBJECTIVE:   Vital Signs: BP (!) 122/62 (BP Location: Right Arm, Patient Position: Sitting, Cuff Size: Large)   Pulse 71   Ht '5\' 2"'  (1.575 m)   Wt (!) 209 lb 9.6 oz (95.1 kg)   SpO2 97%   BMI 38.34 kg/m   Wt Readings from Last 3 Encounters:  06/24/20 (!) 209 lb 9.6 oz (95.1 kg)  03/11/20 207 lb 0.6 oz (93.9 kg)  02/13/20 204 lb 9.6 oz (92.8 kg)     Exam: General: Pt appears well and is in NAD  Lungs: Clear with good BS bilat with no rales, rhonchi, or wheezes  Heart: RRR with normal S1 and S2 and no gallops; no murmurs; no rub  Extremities: No pretibial edema.  Neuro: MS is good with appropriate affect, pt is alert and Ox3    DM foot exam:   02/13/2020  The skin of the feet is intact without sores or ulcerations. The pedal pulses are 2+ on right and 2+ on left. The sensation is intact to a screening 5.07, 10 gram monofilament bilaterally    DATA REVIEWED:  Lab Results  Component Value Date   HGBA1C 7.9 (H) 06/01/2019   HGBA1C 7.0 07/19/2017   HGBA1C 7.0 (H) 04/06/2017   Lab Results  Component Value Date   MICROALBUR 0.4 04/21/2017   LDLCALC 77 05/31/2019   CREATININE 1.19 (H) 06/03/2019   Lab Results  Component Value Date   MICRALBCREAT 10 04/21/2017     Lab Results  Component Value Date   CHOL 140 05/31/2019   HDL 42 05/31/2019   LDLCALC 77  05/31/2019   LDLDIRECT 76.0 12/30/2016   TRIG 105 05/31/2019   CHOLHDL 3.3 05/31/2019         ASSESSMENT / PLAN / RECOMMENDATIONS:   1) Type 2 Diabetes Mellitus, optimally controlled, With neuropathic complications - Most recent A1c of 6.5 %. Goal A1c <7.0%.   - Pt tells me she had an A1c in early July at [facility] office with an A1c of 6.5 % These records are not available at this time, but her meter download shows an average BG of 192 mg/dL which is equivalent to an A1c of 8.3% . Pt admits to dietary indiscretions and snacking at night, we discussed increasing the dose of trulicity    MEDICATIONS:  Continue glipizide 5 mg daily before breakfast  Continue pioglitazone 15 mg, 1 tablet daily  IncreaseTrulicity 3 mg weekly  EDUCATION / INSTRUCTIONS:  BG monitoring instructions: Patient is instructed to check her blood sugars 2 times a day, fasting and suppertime.  Call [facility] if: BG persistently < 70  . I reviewed the Rule of 15 for the treatment of hypoglycemia in detail with the patient. Literature supplied.    2) Diabetic complications:   Eye: Does not have known diabetic retinopathy.   Neuro/ Feet: Does  have known diabetic peripheral neuropathy.  Renal: Patient does not have known baseline CKD. She is  on an ACEI/ARB at present.  F/U in 4 months   Signed electronically by: Mack Guise, MD  [facility]  [facility] [address] [place], [place] [address] Phone: [phone] FAX: [phone]   CC: Sandi Mariscal, [place] [address] Phone: [phone]  Fax: [phone]  Return to Endocrinology clinic as below: Future Appointments  Date Time  Provider [place]  11/04/2020  1:20 PM Cadey Bazile, Melanie Crazier, MD [facility] None  02/06/2021 12:15 PM Kassie Mends, RN [facility] None  02/11/2021 10:45 AM Rankin, Clent Demark, MD [facility] None

## 2020-07-18 ENCOUNTER — Other Ambulatory Visit: Payer: Self-pay | Admitting: Internal Medicine

## 2020-07-18 DIAGNOSIS — E1142 Type 2 diabetes mellitus with diabetic polyneuropathy: Secondary | ICD-10-CM

## 2020-07-31 DIAGNOSIS — I10 Essential (primary) hypertension: Secondary | ICD-10-CM | POA: Diagnosis not present

## 2020-07-31 DIAGNOSIS — E7800 Pure hypercholesterolemia, unspecified: Secondary | ICD-10-CM | POA: Diagnosis not present

## 2020-07-31 DIAGNOSIS — E559 Vitamin D deficiency, unspecified: Secondary | ICD-10-CM | POA: Diagnosis not present

## 2020-07-31 DIAGNOSIS — Z20822 Contact with and (suspected) exposure to covid-19: Secondary | ICD-10-CM | POA: Diagnosis not present

## 2020-07-31 DIAGNOSIS — E119 Type 2 diabetes mellitus without complications: Secondary | ICD-10-CM | POA: Diagnosis not present

## 2020-08-29 DIAGNOSIS — R310 Gross hematuria: Secondary | ICD-10-CM | POA: Diagnosis not present

## 2020-09-06 ENCOUNTER — Other Ambulatory Visit: Payer: Self-pay | Admitting: Internal Medicine

## 2020-09-06 DIAGNOSIS — E1142 Type 2 diabetes mellitus with diabetic polyneuropathy: Secondary | ICD-10-CM

## 2020-09-23 ENCOUNTER — Telehealth: Payer: Self-pay | Admitting: Internal Medicine

## 2020-09-23 DIAGNOSIS — E1142 Type 2 diabetes mellitus with diabetic polyneuropathy: Secondary | ICD-10-CM

## 2020-09-23 MED ORDER — GLIPIZIDE 5 MG PO TABS: 7.5000 mg | ORAL_TABLET | Freq: Every day | ORAL | 2 refills | Status: DC

## 2020-09-23 NOTE — Telephone Encounter (Signed)
Please advise. I do not see any where in patient's records that this was supposed to be increased.

## 2020-09-23 NOTE — Telephone Encounter (Signed)
Increase dose sent

## 2020-09-23 NOTE — Telephone Encounter (Signed)
Patient called re: Patient states Dr. Kelton Pillar increased patient's dosage of glipiZIDE (GLUCOTROL) 5 MG tablet to take 1 1/2 tablets per day. Patient states PHARM told patient they need a new RX for the medication listed above to reflect the increased dosage, due to current dosage on RX is 1 tablet per day not 1 1/2 tablets per day. Patient requests the new RX be sent to:  [facility], [place] Phone:  [phone]  Fax:  [phone]

## 2020-09-24 NOTE — Telephone Encounter (Signed)
Spoken to patient and notified Dr Shamleffer's comments. Verbalized understanding.   

## 2020-10-07 DIAGNOSIS — R079 Chest pain, unspecified: Secondary | ICD-10-CM | POA: Diagnosis not present

## 2020-10-15 ENCOUNTER — Other Ambulatory Visit (INDEPENDENT_AMBULATORY_CARE_PROVIDER_SITE_OTHER): Payer: Self-pay | Admitting: Ophthalmology

## 2020-11-01 DIAGNOSIS — Z1159 Encounter for screening for other viral diseases: Secondary | ICD-10-CM | POA: Diagnosis not present

## 2020-11-01 DIAGNOSIS — E7800 Pure hypercholesterolemia, unspecified: Secondary | ICD-10-CM | POA: Diagnosis not present

## 2020-11-01 DIAGNOSIS — E119 Type 2 diabetes mellitus without complications: Secondary | ICD-10-CM | POA: Diagnosis not present

## 2020-11-01 DIAGNOSIS — E559 Vitamin D deficiency, unspecified: Secondary | ICD-10-CM | POA: Diagnosis not present

## 2020-11-01 DIAGNOSIS — I10 Essential (primary) hypertension: Secondary | ICD-10-CM | POA: Diagnosis not present

## 2020-11-04 ENCOUNTER — Ambulatory Visit (INDEPENDENT_AMBULATORY_CARE_PROVIDER_SITE_OTHER): Payer: HMO | Admitting: Internal Medicine

## 2020-11-04 ENCOUNTER — Other Ambulatory Visit: Payer: Self-pay

## 2020-11-04 ENCOUNTER — Encounter: Payer: Self-pay | Admitting: Internal Medicine

## 2020-11-04 VITALS — BP 142/72 | HR 81 | Ht 62.0 in | Wt 208.5 lb

## 2020-11-04 DIAGNOSIS — E1142 Type 2 diabetes mellitus with diabetic polyneuropathy: Secondary | ICD-10-CM | POA: Diagnosis not present

## 2020-11-04 NOTE — Patient Instructions (Signed)
-   Continue trulicity 3 mg weekly  - Continue Glipizide 5 mg , 1 tablet before Breakfast, and half a tablet before supper - Continue Actos 15 mg, 1 tablet with Breakfast      - HOW TO TREAT LOW BLOOD SUGARS (Blood sugar LESS THAN 70 MG/DL) Please follow the RULE OF 15 for the treatment of hypoglycemia treatment (when your (blood sugars are less than 70 mg/dL)   STEP 1: Take 15 grams of carbohydrates when your blood sugar is low, which includes:  3-4 GLUCOSE TABS  OR 3-4 OZ OF JUICE OR REGULAR SODA OR ONE TUBE OF GLUCOSE GEL    STEP 2: RECHECK blood sugar in 15 MINUTES STEP 3: If your blood sugar is still low at the 15 minute recheck --> then, go back to STEP 1 and treat AGAIN with another 15 grams of carbohydrates.  

## 2020-11-04 NOTE — Progress Notes (Signed)
Name: Donna Fuller  Age/ Sex: 79 y.o., female   MRN/ DOB: [id number], Aug 29, 1941     PCP: Sandi Mariscal, MD   Reason for Endocrinology Evaluation: Type 2 Diabetes Mellitus  Initial Endocrine Consultative Visit: 12/14/2019    PATIENT IDENTIFIER: Donna Fuller is a 79 y.o. female with a past medical history of T2DM, HTN and Dyslipidemia. The patient has followed with Endocrinology clinic since 12/14/2019 for consultative assistance with management of her diabetes.  DIABETIC HISTORY:  Donna Fuller was diagnosed with T2DM many years ago. She was on metformin due to low renal function, has been on Januvia, Farxia and insulin as well. Her hemoglobin A1c has ranged from 6.0 %in 2015, peaking at 8.8% in 2020.  On her initial visit to our clinic her A1c was 8.8%   , she was on Trulicity and pioglitazone, we added glipizide  SUBJECTIVE:   During the last visit (06/24/2020): A1c 6.5%, we increased  Trulicity,and continued   Pioglitazone, and glipizide  Today (11/04/2020): [name] is here for follow-up on diabetes management. She checks her blood sugars 2 times daily, preprandial to breakfast and dinnertime.  The patient has not had hypoglycemic episodes since the last clinic visit.   Denies sob  Denies nausea and  diarrhea. She is having LE pains, pending PT evalution     HOME DIABETES REGIMEN:  Glipizide 5 mg , 1 tablet before Breast and half before supper  Trulicity 3 mg weekly Pioglitazone 15 mg daily    METER DOWNLOAD SUMMARY: Date range evaluated: 11/22-12/04/2020 Average Number Tests/Day = 1.1 Overall Mean FS Glucose = 159   BG Ranges: Low = 135 High = 198   Hypoglycemic Events/30 Days: BG < 50 = 0 Episodes of symptomatic severe hypoglycemia = 0   DIABETIC COMPLICATIONS: Microvascular complications:   Neuropathy   Denies: retinopathy , CKD  Last eye exam: Completed 02/2020  Macrovascular complications:    Denies: CAD, PVD, CVA    HISTORY:   Past Medical History:  Past Medical History:  Diagnosis Date  . Arthritis   . Breast cancer [facility]) 2007   Right breast, s/p mastectomy  . Diabetes mellitus without complication ([place])   . GERD (gastroesophageal reflux disease)   . Gout   . Hyperlipidemia   . Hypertension   . Spinal stenosis    Past Surgical History:  Past Surgical History:  Procedure Laterality Date  . ABDOMINAL HYSTERECTOMY    . BREAST BIOPSY Left    2016 benign   . BREAST LUMPECTOMY    . KNEE ARTHROSCOPY    . MASTECTOMY     right side  . TUBAL LIGATION      Social History:  reports that she has quit smoking. Her smoking use included cigarettes. She has a 10.00 pack-year smoking history. She has never used smokeless tobacco. She reports that she does not drink alcohol and does not use drugs. Family History:  Family History  Problem Relation Age of Onset  . Diabetes Mother   . Alzheimer's disease Mother   . Heart disease Father   . Hyperlipidemia Father   . Kidney disease Father   . Cancer Brother        lung  . Alzheimer's disease Brother   . Heart disease Brother   . Diabetes Sister   . Diabetes Brother   . Diabetes Brother      HOME MEDICATIONS: Allergies as of 11/04/2020      Reactions   Penicillins Swelling  Sulfa Antibiotics Swelling   Amlodipine Swelling   LE Swelling   Lisinopril Cough      Medication List       Accurate as of November 04, 2020  1:48 PM. If you have any questions, ask your nurse or doctor.        acetaminophen 650 MG CR tablet Commonly known as: TYLENOL Take 650 mg by mouth daily.   ascorbic acid 500 MG tablet Commonly known as: VITAMIN C Take 500 mg by mouth daily.   aspirin 81 MG tablet Take 81 mg by mouth daily.   baclofen 10 MG tablet Commonly known as: LIORESAL Take 10 mg by mouth 3 (three) times daily as needed.   Biotin 1000 MCG Chew Chew by mouth.   blood glucose meter kit and supplies Kit Dispense based on patient and insurance  preference. Use up to four times daily as directed. (FOR ICD-9 250.00, 250.01).   brimonidine 0.2 % ophthalmic solution Commonly known as: ALPHAGAN Place 1 drop into both eyes 2 (two) times a day.   ferrous sulfate 325 (65 FE) MG tablet Take 325 mg by mouth daily with breakfast.   furosemide 20 MG tablet Commonly known as: LASIX Take 20 mg by mouth daily.   gabapentin 300 MG capsule Commonly known as: NEURONTIN TAKE 1 CAPSULE BY MOUTH THREE TIMES DAILY ,  MAY  TAKE  AN  ADDITIONAL  CAPSULE  IN  THE  EVENING What changed: See the new instructions.   glipiZIDE 5 MG tablet Commonly known as: GLUCOTROL Take 1.5 tablets (7.5 mg total) by mouth daily before breakfast.   glucose blood test strip Commonly known as: ONE TOUCH ULTRA TEST USE AS DIRECTED TO CHECK BLOOD SUGAR 2-3 TIMES A DAY Pt will need an OV for more refills   ketorolac 0.5 % ophthalmic solution Commonly known as: ACULAR INSTILL 1 DROP IN RIGHT EYE FOUR TIMES DAILY   latanoprost 0.005 % ophthalmic solution Commonly known as: XALATAN Place 1 drop into both eyes daily.   losartan 50 MG tablet Commonly known as: COZAAR Take 50 mg by mouth daily.   losartan-hydrochlorothiazide 50-12.5 MG tablet Commonly known as: HYZAAR Take 1 tablet by mouth daily.   Magnesium 250 MG Tabs Take 1 tablet by mouth daily.   metoprolol succinate 25 MG 24 hr tablet Commonly known as: TOPROL-XL Take 25 mg by mouth daily.   montelukast 10 MG tablet Commonly known as: SINGULAIR Take 10 mg by mouth at bedtime.   omeprazole 40 MG capsule Commonly known as: PRILOSEC Take 1 capsule (40 mg total) by mouth daily. Must keep appt on 12/06/16 with Dr. Ricky Ala Lancets 07M Misc Use as instructed to check blood sugar 2-3 times a day.  DX E11.9   pioglitazone 15 MG tablet Commonly known as: ACTOS Take 15 mg by mouth daily.   pravastatin 20 MG tablet Commonly known as: PRAVACHOL TAKE 1 TABLET BY MOUTH ONCE DAILY    timolol 0.5 % ophthalmic solution Commonly known as: TIMOPTIC Place 1 drop into both eyes 2 (two) times a day.   Trulicity 3 AU/6.3FH Sopn Generic drug: Dulaglutide Inject 0.5 mLs (3 mg total) as directed once a week.   Vitamin D-3 125 MCG (5000 UT) Tabs Take 1 tablet by mouth daily.   zinc gluconate 50 MG tablet Take 50 mg by mouth daily.        OBJECTIVE:   Vital Signs: BP (!) 142/72   Pulse 81   Ht 5'  2" (1.575 m)   Wt 208 lb 8 oz (94.6 kg)   SpO2 98%   BMI 38.14 kg/m   Wt Readings from Last 3 Encounters:  11/04/20 208 lb 8 oz (94.6 kg)  06/24/20 (!) 209 lb 9.6 oz (95.1 kg)  03/11/20 207 lb 0.6 oz (93.9 kg)     Exam: General: Pt appears well and is in NAD  Lungs: Clear with good BS bilat with no rales, rhonchi, or wheezes  Heart: RRR with normal S1 and S2 and no gallops; no murmurs; no rub  Extremities: No pretibial edema.  Neuro: MS is good with appropriate affect, pt is alert and Ox3    DM foot exam:   02/13/2020  The skin of the feet is intact without sores or ulcerations. The pedal pulses are 2+ on right and 2+ on left. The sensation is intact to a screening 5.07, 10 gram monofilament bilaterally    DATA REVIEWED:  Lab Results  Component Value Date   HGBA1C 7.9 (H) 06/01/2019   HGBA1C 7.0 07/19/2017   HGBA1C 7.0 (H) 04/06/2017   Lab Results  Component Value Date   MICROALBUR 0.4 04/21/2017   LDLCALC 77 05/31/2019   CREATININE 1.19 (H) 06/03/2019   Lab Results  Component Value Date   MICRALBCREAT 10 04/21/2017     Lab Results  Component Value Date   CHOL 140 05/31/2019   HDL 42 05/31/2019   LDLCALC 77 05/31/2019   LDLDIRECT 76.0 12/30/2016   TRIG 105 05/31/2019   CHOLHDL 3.3 05/31/2019         ASSESSMENT / PLAN / RECOMMENDATIONS:   1) Type 2 Diabetes Mellitus, optimally controlled, With neuropathic complications - Most recent A1c of 6.6 %. Goal A1c <7.0%.   - Pt tells me she had an A1c on 11/01/2020 at  PCP's office with an  A1c of 6.6 % These records are not available at this time,her meter download shows an average BG of 159 mg/dL - Tolerating medications well without side effects, no changes     MEDICATIONS:  Continue glipizide 5 mg, 1 tablet before Breakfast and half a tablet before supper  Continue pioglitazone 15 mg, 1 tablet daily  Continue Trulicity 3 mg weekly  EDUCATION / INSTRUCTIONS:  BG monitoring instructions: Patient is instructed to check her blood sugars 2 times a day, fasting and suppertime.  Call [facility] if: BG persistently < 70  . I reviewed the Rule of 15 for the treatment of hypoglycemia in detail with the patient. Literature supplied.    2) Diabetic complications:   Eye: Does not have known diabetic retinopathy.   Neuro/ Feet: Does  have known diabetic peripheral neuropathy.  Renal: Patient does not have known baseline CKD. She is  on an ACEI/ARB at present.  F/U in 6 months   Signed electronically by: Mack Guise, MD  [facility]  [facility] [address] [place], [place] [address] Phone: [phone] FAX: [phone]   CC: Sandi Mariscal, [place] [address] Phone: [phone]  Fax: [phone]  Return to Endocrinology clinic as below: Future Appointments  Date Time Provider [place]  02/06/2021 12:15 PM Kassie Mends, RN THN-LTW None  02/11/2021 10:45 AM [place], Clent Demark, MD RDE-R[facility] None

## 2020-11-12 DIAGNOSIS — R079 Chest pain, unspecified: Secondary | ICD-10-CM | POA: Diagnosis not present

## 2020-11-12 DIAGNOSIS — I10 Essential (primary) hypertension: Secondary | ICD-10-CM | POA: Diagnosis not present

## 2020-11-13 ENCOUNTER — Other Ambulatory Visit: Payer: Self-pay | Admitting: Family Medicine

## 2020-11-13 DIAGNOSIS — Z0000 Encounter for general adult medical examination without abnormal findings: Secondary | ICD-10-CM

## 2020-11-15 ENCOUNTER — Other Ambulatory Visit: Payer: Self-pay | Admitting: *Deleted

## 2020-11-15 NOTE — Patient Outreach (Signed)
  [facility]) Care Management Chronic Special Needs Program    11/15/2020  Name: MINAL STULLER, DOB: 07-06-1941  MRN: [id number]   Ms. Brittnee Gaetano is enrolled in a chronic special needs plan for Diabetes.  [facility] care management will continue to provide services for this member through 11/29/20.  [facility] care management team will assume care 11/30/20.  Jacqlyn Larsen [facility], BSN [place], [place]

## 2020-12-02 ENCOUNTER — Other Ambulatory Visit: Payer: HMO

## 2020-12-02 DIAGNOSIS — Z20822 Contact with and (suspected) exposure to covid-19: Secondary | ICD-10-CM

## 2020-12-03 LAB — SARS-COV-2, NAA 2 DAY TAT

## 2020-12-03 LAB — NOVEL CORONAVIRUS, NAA: SARS-CoV-2, NAA: NOT DETECTED

## 2020-12-05 ENCOUNTER — Other Ambulatory Visit: Payer: Self-pay | Admitting: *Deleted

## 2020-12-12 DIAGNOSIS — I10 Essential (primary) hypertension: Secondary | ICD-10-CM | POA: Diagnosis not present

## 2020-12-12 DIAGNOSIS — G8929 Other chronic pain: Secondary | ICD-10-CM | POA: Diagnosis not present

## 2020-12-12 DIAGNOSIS — M545 Low back pain, unspecified: Secondary | ICD-10-CM | POA: Diagnosis not present

## 2020-12-12 DIAGNOSIS — E119 Type 2 diabetes mellitus without complications: Secondary | ICD-10-CM | POA: Diagnosis not present

## 2020-12-12 DIAGNOSIS — E782 Mixed hyperlipidemia: Secondary | ICD-10-CM | POA: Diagnosis not present

## 2020-12-20 DIAGNOSIS — M2569 Stiffness of other specified joint, not elsewhere classified: Secondary | ICD-10-CM | POA: Diagnosis not present

## 2020-12-20 DIAGNOSIS — R262 Difficulty in walking, not elsewhere classified: Secondary | ICD-10-CM | POA: Diagnosis not present

## 2020-12-20 DIAGNOSIS — M6281 Muscle weakness (generalized): Secondary | ICD-10-CM | POA: Diagnosis not present

## 2020-12-20 DIAGNOSIS — M545 Low back pain, unspecified: Secondary | ICD-10-CM | POA: Diagnosis not present

## 2020-12-20 DIAGNOSIS — R293 Abnormal posture: Secondary | ICD-10-CM | POA: Diagnosis not present

## 2020-12-25 ENCOUNTER — Ambulatory Visit
Admission: RE | Admit: 2020-12-25 | Discharge: 2020-12-25 | Disposition: A | Discharge status: Home / Self Care | Payer: HMO | Source: Ambulatory Visit | Attending: Family Medicine | Admitting: Family Medicine

## 2020-12-25 ENCOUNTER — Other Ambulatory Visit: Payer: Self-pay | Admitting: Family Medicine

## 2020-12-25 ENCOUNTER — Other Ambulatory Visit: Payer: Self-pay

## 2020-12-25 DIAGNOSIS — Z1231 Encounter for screening mammogram for malignant neoplasm of breast: Secondary | ICD-10-CM | POA: Diagnosis not present

## 2020-12-25 DIAGNOSIS — Z0000 Encounter for general adult medical examination without abnormal findings: Secondary | ICD-10-CM

## 2020-12-27 DIAGNOSIS — M6281 Muscle weakness (generalized): Secondary | ICD-10-CM | POA: Diagnosis not present

## 2020-12-27 DIAGNOSIS — M545 Low back pain, unspecified: Secondary | ICD-10-CM | POA: Diagnosis not present

## 2020-12-27 DIAGNOSIS — M2569 Stiffness of other specified joint, not elsewhere classified: Secondary | ICD-10-CM | POA: Diagnosis not present

## 2020-12-27 DIAGNOSIS — R262 Difficulty in walking, not elsewhere classified: Secondary | ICD-10-CM | POA: Diagnosis not present

## 2020-12-27 DIAGNOSIS — R293 Abnormal posture: Secondary | ICD-10-CM | POA: Diagnosis not present

## 2021-01-02 DIAGNOSIS — M2569 Stiffness of other specified joint, not elsewhere classified: Secondary | ICD-10-CM | POA: Diagnosis not present

## 2021-01-02 DIAGNOSIS — R293 Abnormal posture: Secondary | ICD-10-CM | POA: Diagnosis not present

## 2021-01-02 DIAGNOSIS — R262 Difficulty in walking, not elsewhere classified: Secondary | ICD-10-CM | POA: Diagnosis not present

## 2021-01-02 DIAGNOSIS — M6281 Muscle weakness (generalized): Secondary | ICD-10-CM | POA: Diagnosis not present

## 2021-01-02 DIAGNOSIS — M545 Low back pain, unspecified: Secondary | ICD-10-CM | POA: Diagnosis not present

## 2021-01-07 DIAGNOSIS — M545 Low back pain, unspecified: Secondary | ICD-10-CM | POA: Diagnosis not present

## 2021-01-07 DIAGNOSIS — R262 Difficulty in walking, not elsewhere classified: Secondary | ICD-10-CM | POA: Diagnosis not present

## 2021-01-07 DIAGNOSIS — R293 Abnormal posture: Secondary | ICD-10-CM | POA: Diagnosis not present

## 2021-01-07 DIAGNOSIS — M2569 Stiffness of other specified joint, not elsewhere classified: Secondary | ICD-10-CM | POA: Diagnosis not present

## 2021-01-07 DIAGNOSIS — M6281 Muscle weakness (generalized): Secondary | ICD-10-CM | POA: Diagnosis not present

## 2021-01-14 DIAGNOSIS — M545 Low back pain, unspecified: Secondary | ICD-10-CM | POA: Diagnosis not present

## 2021-01-14 DIAGNOSIS — M6281 Muscle weakness (generalized): Secondary | ICD-10-CM | POA: Diagnosis not present

## 2021-01-14 DIAGNOSIS — R262 Difficulty in walking, not elsewhere classified: Secondary | ICD-10-CM | POA: Diagnosis not present

## 2021-01-14 DIAGNOSIS — R293 Abnormal posture: Secondary | ICD-10-CM | POA: Diagnosis not present

## 2021-01-14 DIAGNOSIS — M2569 Stiffness of other specified joint, not elsewhere classified: Secondary | ICD-10-CM | POA: Diagnosis not present

## 2021-01-21 DIAGNOSIS — M545 Low back pain, unspecified: Secondary | ICD-10-CM | POA: Diagnosis not present

## 2021-01-21 DIAGNOSIS — R293 Abnormal posture: Secondary | ICD-10-CM | POA: Diagnosis not present

## 2021-01-21 DIAGNOSIS — M2569 Stiffness of other specified joint, not elsewhere classified: Secondary | ICD-10-CM | POA: Diagnosis not present

## 2021-01-21 DIAGNOSIS — R262 Difficulty in walking, not elsewhere classified: Secondary | ICD-10-CM | POA: Diagnosis not present

## 2021-01-21 DIAGNOSIS — M6281 Muscle weakness (generalized): Secondary | ICD-10-CM | POA: Diagnosis not present

## 2021-01-23 DIAGNOSIS — M2569 Stiffness of other specified joint, not elsewhere classified: Secondary | ICD-10-CM | POA: Diagnosis not present

## 2021-01-23 DIAGNOSIS — M6281 Muscle weakness (generalized): Secondary | ICD-10-CM | POA: Diagnosis not present

## 2021-01-23 DIAGNOSIS — R293 Abnormal posture: Secondary | ICD-10-CM | POA: Diagnosis not present

## 2021-01-23 DIAGNOSIS — R262 Difficulty in walking, not elsewhere classified: Secondary | ICD-10-CM | POA: Diagnosis not present

## 2021-01-23 DIAGNOSIS — M545 Low back pain, unspecified: Secondary | ICD-10-CM | POA: Diagnosis not present

## 2021-01-28 DIAGNOSIS — R262 Difficulty in walking, not elsewhere classified: Secondary | ICD-10-CM | POA: Diagnosis not present

## 2021-01-28 DIAGNOSIS — M545 Low back pain, unspecified: Secondary | ICD-10-CM | POA: Diagnosis not present

## 2021-01-28 DIAGNOSIS — R293 Abnormal posture: Secondary | ICD-10-CM | POA: Diagnosis not present

## 2021-01-28 DIAGNOSIS — M6281 Muscle weakness (generalized): Secondary | ICD-10-CM | POA: Diagnosis not present

## 2021-01-28 DIAGNOSIS — M2569 Stiffness of other specified joint, not elsewhere classified: Secondary | ICD-10-CM | POA: Diagnosis not present

## 2021-02-04 DIAGNOSIS — M545 Low back pain, unspecified: Secondary | ICD-10-CM | POA: Diagnosis not present

## 2021-02-04 DIAGNOSIS — M6281 Muscle weakness (generalized): Secondary | ICD-10-CM | POA: Diagnosis not present

## 2021-02-04 DIAGNOSIS — R293 Abnormal posture: Secondary | ICD-10-CM | POA: Diagnosis not present

## 2021-02-04 DIAGNOSIS — M2569 Stiffness of other specified joint, not elsewhere classified: Secondary | ICD-10-CM | POA: Diagnosis not present

## 2021-02-04 DIAGNOSIS — R262 Difficulty in walking, not elsewhere classified: Secondary | ICD-10-CM | POA: Diagnosis not present

## 2021-02-06 ENCOUNTER — Ambulatory Visit: Payer: HMO | Admitting: *Deleted

## 2021-02-11 ENCOUNTER — Other Ambulatory Visit: Payer: Self-pay

## 2021-02-11 ENCOUNTER — Encounter (INDEPENDENT_AMBULATORY_CARE_PROVIDER_SITE_OTHER): Payer: Self-pay | Admitting: Ophthalmology

## 2021-02-11 ENCOUNTER — Ambulatory Visit (INDEPENDENT_AMBULATORY_CARE_PROVIDER_SITE_OTHER): Payer: HMO | Admitting: Ophthalmology

## 2021-02-11 DIAGNOSIS — Z961 Presence of intraocular lens: Secondary | ICD-10-CM | POA: Diagnosis not present

## 2021-02-11 DIAGNOSIS — E113392 Type 2 diabetes mellitus with moderate nonproliferative diabetic retinopathy without macular edema, left eye: Secondary | ICD-10-CM | POA: Diagnosis not present

## 2021-02-11 DIAGNOSIS — H35351 Cystoid macular degeneration, right eye: Secondary | ICD-10-CM

## 2021-02-11 DIAGNOSIS — H401132 Primary open-angle glaucoma, bilateral, moderate stage: Secondary | ICD-10-CM | POA: Diagnosis not present

## 2021-02-11 MED ORDER — KETOROLAC TROMETHAMINE 0.5 % OP SOLN: OPHTHALMIC | 6 refills | Status: DC

## 2021-02-11 NOTE — Assessment & Plan Note (Signed)
Continue topical ketorolac right eye 2-3 times daily indefinitely to prevent recurrence of CME

## 2021-02-11 NOTE — Progress Notes (Signed)
02/11/2021     CHIEF COMPLAINT Patient presents for Retina Follow Up (1 Year F/U OU//Pt denies noticeable changes to [place] OU since last visit. Pt denies ocular pain, flashes of light, or floaters OU. /A1c: 6.5, 3-4 mo ago per pt/LBS: 160 this AM/)   HISTORY OF PRESENT ILLNESS: Donna Fuller is a 80 y.o. female who presents to the clinic today for:   HPI    Retina Follow Up    Patient presents with  Diabetic Retinopathy.  In both eyes.  This started 1 year ago.  Severity is mild.  Duration of 1 year.  Since onset it is stable. Additional comments: 1 Year F/U OU  Pt denies noticeable changes to [place] OU since last visit. Pt denies ocular pain, flashes of light, or floaters OU.  A1c: 6.5, 3-4 mo ago per pt LBS: 160 this AM        Last edited by Rockie Neighbours, [place] on 02/11/2021 10:56 AM. (History)      Referring physician: Sandi Mariscal, MD [place],  [place] [address]  HISTORICAL INFORMATION:   Selected notes from the MEDICAL RECORD NUMBER    Lab Results  Component Value Date   HGBA1C 7.9 (H) 06/01/2019     CURRENT MEDICATIONS: Current Outpatient Medications (Ophthalmic Drugs)  Medication Sig  . brimonidine (ALPHAGAN) 0.2 % ophthalmic solution Place 1 drop into both eyes 2 (two) times a day.  . ketorolac (ACULAR) 0.5 % ophthalmic solution INSTILL 1 DROP IN RIGHT EYE FOUR TIMES DAILY  . latanoprost (XALATAN) 0.005 % ophthalmic solution Place 1 drop into both eyes daily.  . timolol (TIMOPTIC) 0.5 % ophthalmic solution Place 1 drop into both eyes 2 (two) times a day.   No current facility-administered medications for this visit. (Ophthalmic Drugs)   Current Outpatient Medications (Other)  Medication Sig  . acetaminophen (TYLENOL) 650 MG CR tablet Take 650 mg by mouth daily.  Marland Kitchen ascorbic acid (VITAMIN C) 500 MG tablet Take 500 mg by mouth daily.  Marland Kitchen aspirin 81 MG tablet Take 81 mg by mouth daily.  . baclofen (LIORESAL) 10 MG tablet Take 10 mg by mouth 3 (three)  times daily as needed.  . Biotin 1000 MCG CHEW Chew by mouth.  . blood glucose meter kit and supplies KIT Dispense based on patient and insurance preference. Use up to four times daily as directed. (FOR ICD-9 250.00, 250.01).  . Cholecalciferol (VITAMIN D-3) 5000 units TABS Take 1 tablet by mouth daily.  . Dulaglutide (TRULICITY) 3 AG/5.3MI SOPN Inject 0.5 mLs (3 mg total) as directed once a week.  . ferrous sulfate 325 (65 FE) MG tablet Take 325 mg by mouth daily with breakfast.  . furosemide (LASIX) 20 MG tablet Take 20 mg by mouth daily.  Marland Kitchen gabapentin (NEURONTIN) 300 MG capsule TAKE 1 CAPSULE BY MOUTH THREE TIMES DAILY ,  MAY  TAKE  AN  ADDITIONAL  CAPSULE  IN  THE  EVENING (Patient taking differently: Take 300 mg by mouth See admin instructions. Take 3110m three times daily, may take an additional 3049min the evening.)  . glipiZIDE (GLUCOTROL) 5 MG tablet Take 1.5 tablets (7.5 mg total) by mouth daily before breakfast.  . glucose blood (ONE TOUCH ULTRA TEST) test strip USE AS DIRECTED TO CHECK BLOOD SUGAR 2-3 TIMES A DAY Pt will need an OV for more refills  . losartan (COZAAR) 50 MG tablet Take 50 mg by mouth daily.  . Marland Kitchenosartan-hydrochlorothiazide (HYZAAR) 50-12.5 MG tablet Take  1 tablet by mouth daily.  . Magnesium 250 MG TABS Take 1 tablet by mouth daily.  . metoprolol succinate (TOPROL-XL) 25 MG 24 hr tablet Take 25 mg by mouth daily.  . montelukast (SINGULAIR) 10 MG tablet Take 10 mg by mouth at bedtime.  Marland Kitchen omeprazole (PRILOSEC) 40 MG capsule Take 1 capsule (40 mg total) by mouth daily. Must keep appt on 12/06/16 with Dr. Caryl Bis  . ONETOUCH DELICA LANCETS 54G MISC Use as instructed to check blood sugar 2-3 times a day.  DX E11.9  . pioglitazone (ACTOS) 15 MG tablet Take 15 mg by mouth daily.  . pravastatin (PRAVACHOL) 20 MG tablet TAKE 1 TABLET BY MOUTH ONCE DAILY (Patient taking differently: Take 20 mg by mouth daily. )  . zinc gluconate 50 MG tablet Take 50 mg by mouth daily.   No  current facility-administered medications for this visit. (Other)      REVIEW OF SYSTEMS:    ALLERGIES Allergies  Allergen Reactions  . Penicillins Swelling  . Sulfa Antibiotics Swelling  . Amlodipine Swelling    LE Swelling  . Lisinopril Cough    PAST MEDICAL HISTORY Past Medical History:  Diagnosis Date  . Arthritis   . Breast cancer [facility]) 2007   Right breast, s/p mastectomy  . Diabetes mellitus without complication ([place])   . GERD (gastroesophageal reflux disease)   . Gout   . Hyperlipidemia   . Hypertension   . Spinal stenosis    Past Surgical History:  Procedure Laterality Date  . ABDOMINAL HYSTERECTOMY    . BREAST BIOPSY Left    2016 benign   . BREAST LUMPECTOMY    . KNEE ARTHROSCOPY    . MASTECTOMY     right side  . TUBAL LIGATION      FAMILY HISTORY Family History  Problem Relation Age of Onset  . Diabetes Mother   . Alzheimer's disease Mother   . Heart disease Father   . Hyperlipidemia Father   . Kidney disease Father   . Cancer Brother        lung  . Alzheimer's disease Brother   . Heart disease Brother   . Diabetes Sister   . Diabetes Brother   . Diabetes Brother     SOCIAL HISTORY Social History   Tobacco Use  . Smoking status: Former Smoker    Packs/day: 2.00    Years: 5.00    Pack years: 10.00    Types: Cigarettes  . Smokeless tobacco: Never Used  Vaping Use  . Vaping Use: Never used  Substance Use Topics  . Alcohol use: No  . Drug use: No         OPHTHALMIC EXAM:  Base Eye Exam    Visual Acuity (ETDRS)      Right Left   Dist [place] 20/40 +1 20/40 +1   Dist ph [place] 20/25 -2 20/25 +2       Tonometry (Tonopen, 10:57 AM)      Right Left   Pressure 13 10       Pupils      Pupils Dark Light Shape React APD   Right PERRL 4.5 3.5 Round Brisk None   Left PERRL 4.5 3.5 Round Brisk None       Visual Fields (Counting fingers)      Left Right    Full Full       Extraocular Movement      Right Left    Full Full  Neuro/Psych    Oriented x3: Yes   Mood/Affect: Normal       Dilation    Both eyes: 1.0% Mydriacyl, 2.5% Phenylephrine @ 11:03 AM        Slit Lamp and Fundus Exam    External Exam      Right Left   External Normal Normal       Slit Lamp Exam      Right Left   Lids/Lashes Normal Normal   Conjunctiva/Sclera White and quiet White and quiet   Cornea Clear Clear   Anterior Chamber Deep and quiet Deep and quiet   Iris Round and reactive Round and reactive   Lens Centered posterior chamber intraocular lens, AKREOS,, well centered, no chafe, no tilt Centered posterior chamber intraocular lens   Anterior Vitreous Normal Normal       Fundus Exam      Right Left   Posterior Vitreous Clear avitric Clear avitric   Disc Normal Normal   C/D Ratio 0.6 0.6   Macula Normal Normal   Vessels Normal Normal   Periphery Normal Normal          IMAGING AND PROCEDURES  Imaging and Procedures for 02/11/21  OCT, Retina - OU - Both Eyes       Right Eye Quality was good. Scan locations included subfoveal. Central Foveal Thickness: 240. Progression has been stable.   Left Eye Quality was good. Scan locations included subfoveal. Central Foveal Thickness: 256. Progression has been stable.   Notes History of pseudophakic CME likely Gore-Tex suture fixation related OD, now resolved on continuous use of topical NSAID OD  No active maculopathy OU                ASSESSMENT/PLAN:  Cystoid macular edema, right eye Continue topical ketorolac right eye 2-3 times daily indefinitely to prevent recurrence of CME      ICD-10-CM   1. Cystoid macular edema, right eye  H35.351 OCT, Retina - OU - Both Eyes  2. Moderate nonproliferative diabetic retinopathy of left eye without macular edema associated with type 2 diabetes mellitus (HCC)  B15.1761 OCT, Retina - OU - Both Eyes  3. Pseudophakia  Z96.1   4. Primary open angle glaucoma of both eyes, moderate stage  H40.1132     1.  OD  continues to do very well with established pseudophakia post suture fixation posterior chamber intraocular lens. 2.  Patient to remain on topical NSAID to prevent recurrence of CME.  3.  I have encouraged the patient to avoid and restrain from mashing rubbing a compress in the eye, but also she has the option to decrease the use of ketorolac to twice daily and if acuity does not blur she may be able to sustain resolution of CME long-term on twice daily usage of topical ketorolac OD  Ophthalmic Meds Ordered this visit:  No orders of the defined types were placed in this encounter.      Return in about 1 year (around 02/11/2022) for DILATE OU, OCT.  There are no Patient Instructions on file for this visit.   Explained the diagnoses, plan, and follow up with the patient and they expressed understanding.  Patient expressed understanding of the importance of proper follow up care.   Clent Demark Brittainy Bucker M.D. Diseases & Surgery of the Retina and Vitreous Retina & Diabetic [place] 02/11/21     Abbreviations: M myopia (nearsighted); A astigmatism; H hyperopia (farsighted); P presbyopia; Mrx spectacle prescription;  CTL contact lenses; OD  right eye; OS left eye; OU both eyes  XT exotropia; ET esotropia; PEK punctate epithelial keratitis; PEE punctate epithelial erosions; DES dry eye syndrome; MGD meibomian gland dysfunction; ATs artificial tears; PFAT's preservative free artificial tears; [place] nuclear sclerotic cataract; PSC posterior subcapsular cataract; ERM epi-retinal membrane; PVD posterior vitreous detachment; RD retinal detachment; DM diabetes mellitus; DR diabetic retinopathy; NPDR non-proliferative diabetic retinopathy; PDR proliferative diabetic retinopathy; CSME clinically significant macular edema; DME diabetic macular edema; dbh dot blot hemorrhages; CWS cotton wool spot; POAG primary open angle glaucoma; C/D cup-to-disc ratio; HVF humphrey visual field; GVF goldmann visual field; OCT  optical coherence tomography; IOP intraocular pressure; BRVO Branch retinal vein occlusion; CRVO central retinal vein occlusion; CRAO central retinal artery occlusion; BRAO branch retinal artery occlusion; RT retinal tear; SB scleral buckle; PPV pars plana vitrectomy; VH Vitreous hemorrhage; PRP panretinal laser photocoagulation; IVK intravitreal kenalog; VMT vitreomacular traction; MH Macular hole;  NVD neovascularization of the disc; NVE neovascularization elsewhere; AREDS age related eye disease study; ARMD age related macular degeneration; POAG primary open angle glaucoma; EBMD epithelial/anterior basement membrane dystrophy; ACIOL anterior chamber intraocular lens; IOL intraocular lens; PCIOL posterior chamber intraocular lens; Phaco/IOL phacoemulsification with intraocular lens placement; [place] photorefractive keratectomy; LASIK laser assisted in situ keratomileusis; HTN hypertension; DM diabetes mellitus; COPD chronic obstructive pulmonary disease

## 2021-02-13 DIAGNOSIS — M545 Low back pain, unspecified: Secondary | ICD-10-CM | POA: Diagnosis not present

## 2021-02-13 DIAGNOSIS — M6281 Muscle weakness (generalized): Secondary | ICD-10-CM | POA: Diagnosis not present

## 2021-02-13 DIAGNOSIS — R262 Difficulty in walking, not elsewhere classified: Secondary | ICD-10-CM | POA: Diagnosis not present

## 2021-02-13 DIAGNOSIS — M2569 Stiffness of other specified joint, not elsewhere classified: Secondary | ICD-10-CM | POA: Diagnosis not present

## 2021-02-13 DIAGNOSIS — R293 Abnormal posture: Secondary | ICD-10-CM | POA: Diagnosis not present

## 2021-02-25 DIAGNOSIS — N3592 Unspecified urethral stricture, female: Secondary | ICD-10-CM | POA: Diagnosis not present

## 2021-03-07 DIAGNOSIS — H524 Presbyopia: Secondary | ICD-10-CM | POA: Diagnosis not present

## 2021-03-07 DIAGNOSIS — Z961 Presence of intraocular lens: Secondary | ICD-10-CM | POA: Diagnosis not present

## 2021-03-07 DIAGNOSIS — E113292 Type 2 diabetes mellitus with mild nonproliferative diabetic retinopathy without macular edema, left eye: Secondary | ICD-10-CM | POA: Diagnosis not present

## 2021-03-07 DIAGNOSIS — H401132 Primary open-angle glaucoma, bilateral, moderate stage: Secondary | ICD-10-CM | POA: Diagnosis not present

## 2021-03-07 DIAGNOSIS — H52223 Regular astigmatism, bilateral: Secondary | ICD-10-CM | POA: Diagnosis not present

## 2021-03-24 DIAGNOSIS — E7800 Pure hypercholesterolemia, unspecified: Secondary | ICD-10-CM | POA: Diagnosis not present

## 2021-03-24 DIAGNOSIS — R0602 Shortness of breath: Secondary | ICD-10-CM | POA: Diagnosis not present

## 2021-03-24 DIAGNOSIS — Z0000 Encounter for general adult medical examination without abnormal findings: Secondary | ICD-10-CM | POA: Diagnosis not present

## 2021-03-24 DIAGNOSIS — I10 Essential (primary) hypertension: Secondary | ICD-10-CM | POA: Diagnosis not present

## 2021-03-24 DIAGNOSIS — Z1159 Encounter for screening for other viral diseases: Secondary | ICD-10-CM | POA: Diagnosis not present

## 2021-03-24 DIAGNOSIS — E559 Vitamin D deficiency, unspecified: Secondary | ICD-10-CM | POA: Diagnosis not present

## 2021-03-24 DIAGNOSIS — R5383 Other fatigue: Secondary | ICD-10-CM | POA: Diagnosis not present

## 2021-03-24 DIAGNOSIS — Z79899 Other long term (current) drug therapy: Secondary | ICD-10-CM | POA: Diagnosis not present

## 2021-03-24 DIAGNOSIS — E118 Type 2 diabetes mellitus with unspecified complications: Secondary | ICD-10-CM | POA: Diagnosis not present

## 2021-03-24 DIAGNOSIS — Z113 Encounter for screening for infections with a predominantly sexual mode of transmission: Secondary | ICD-10-CM | POA: Diagnosis not present

## 2021-04-03 DIAGNOSIS — N183 Chronic kidney disease, stage 3 unspecified: Secondary | ICD-10-CM | POA: Diagnosis not present

## 2021-04-11 DIAGNOSIS — E119 Type 2 diabetes mellitus without complications: Secondary | ICD-10-CM | POA: Diagnosis not present

## 2021-04-11 DIAGNOSIS — Z9189 Other specified personal risk factors, not elsewhere classified: Secondary | ICD-10-CM | POA: Diagnosis not present

## 2021-04-11 DIAGNOSIS — I10 Essential (primary) hypertension: Secondary | ICD-10-CM | POA: Diagnosis not present

## 2021-04-11 DIAGNOSIS — E785 Hyperlipidemia, unspecified: Secondary | ICD-10-CM | POA: Diagnosis not present

## 2021-04-14 ENCOUNTER — Other Ambulatory Visit: Payer: Self-pay

## 2021-04-14 ENCOUNTER — Ambulatory Visit: Payer: HMO | Admitting: Internal Medicine

## 2021-04-14 ENCOUNTER — Encounter: Payer: Self-pay | Admitting: Internal Medicine

## 2021-04-14 VITALS — BP 140/72 | HR 80 | Ht 62.0 in | Wt 209.1 lb

## 2021-04-14 DIAGNOSIS — E1142 Type 2 diabetes mellitus with diabetic polyneuropathy: Secondary | ICD-10-CM | POA: Diagnosis not present

## 2021-04-14 NOTE — Patient Instructions (Signed)
-   Continue trulicity 3 mg weekly  - Continue Glipizide 5 mg , 1 tablet before Breakfast, and half a tablet before supper - Continue Actos 15 mg, 1 tablet with Breakfast      - HOW TO TREAT LOW BLOOD SUGARS (Blood sugar LESS THAN 70 MG/DL) Please follow the RULE OF 15 for the treatment of hypoglycemia treatment (when your (blood sugars are less than 70 mg/dL)   STEP 1: Take 15 grams of carbohydrates when your blood sugar is low, which includes:  3-4 GLUCOSE TABS  OR 3-4 OZ OF JUICE OR REGULAR SODA OR ONE TUBE OF GLUCOSE GEL    STEP 2: RECHECK blood sugar in 15 MINUTES STEP 3: If your blood sugar is still low at the 15 minute recheck --> then, go back to STEP 1 and treat AGAIN with another 15 grams of carbohydrates.  

## 2021-04-14 NOTE — Progress Notes (Signed)
Name: Donna Fuller  Age/ Sex: 80 y.o., female   MRN/ DOB: [id number], 11-02-1941     PCP: Sandi Mariscal, MD   Reason for Endocrinology Evaluation: Type 2 Diabetes Mellitus  Initial Endocrine Consultative Visit: 12/14/2019    PATIENT IDENTIFIER: [name] is a 80 y.o. female with a past medical history of T2DM, HTN and Dyslipidemia. The patient has followed with Endocrinology clinic since 12/14/2019 for consultative assistance with management of her diabetes.  DIABETIC HISTORY:  Donna Fuller was diagnosed with T2DM many years ago. She was on metformin due to low renal function, has been on Januvia, Farxia and insulin as well. Her hemoglobin A1c has ranged from 6.0 %in 2015, peaking at 8.8% in 2020.  On her initial visit to our clinic her A1c was 8.8%   , she was on Trulicity and pioglitazone, we added glipizide  SUBJECTIVE:   During the last visit (11/04/2020): A1c 6.6%, We continued  Trulicity,Pioglitazone, and glipizide  Today (04/14/2021): Donna Fuller is here for follow-up on diabetes management. She checks her blood sugars 2 times daily, preprandial to breakfast and dinnertime.  The patient has not had hypoglycemic episodes since the last clinic visit.   Has occasional nausea but no diarrhea  Has heartburn  Has been tired and has muscle aches -PT did not help     HOME DIABETES REGIMEN:  Glipizide 5 mg , 1 tablet before Breast and half before supper  Trulicity 3 mg weekly Pioglitazone 15 mg daily    METER DOWNLOAD SUMMARY: Date range evaluated: 5/2-5/16/2022 Average Number Tests/Day = 0.9 Overall Mean FS Glucose = 158   BG Ranges: Low = 123 High = 208   Hypoglycemic Events/30 Days: BG < 50 = 0 Episodes of symptomatic severe hypoglycemia = 0   DIABETIC COMPLICATIONS: Microvascular complications:   Neuropathy   Denies: retinopathy , CKD  Last eye exam: Completed 02/2020  Macrovascular complications:    Denies: CAD, PVD,  CVA    HISTORY:  Past Medical History:  Past Medical History:  Diagnosis Date  . Arthritis   . Breast cancer [facility]) 2007   Right breast, s/p mastectomy  . Diabetes mellitus without complication ([place])   . GERD (gastroesophageal reflux disease)   . Gout   . Hyperlipidemia   . Hypertension   . Spinal stenosis    Past Surgical History:  Past Surgical History:  Procedure Laterality Date  . ABDOMINAL HYSTERECTOMY    . BREAST BIOPSY Left    2016 benign   . BREAST LUMPECTOMY    . KNEE ARTHROSCOPY    . MASTECTOMY     right side  . TUBAL LIGATION      Social History:  reports that she has quit smoking. Her smoking use included cigarettes. She has a 10.00 pack-year smoking history. She has never used smokeless tobacco. She reports that she does not drink alcohol and does not use drugs. Family History:  Family History  Problem Relation Age of Onset  . Diabetes Mother   . Alzheimer's disease Mother   . Heart disease Father   . Hyperlipidemia Father   . Kidney disease Father   . Cancer Brother        lung  . Alzheimer's disease Brother   . Heart disease Brother   . Diabetes Sister   . Diabetes Brother   . Diabetes Brother      HOME MEDICATIONS: Allergies as of 04/14/2021      Reactions   Penicillins Swelling  Sulfa Antibiotics Swelling   Amlodipine Swelling   LE Swelling   Lisinopril Cough      Medication List       Accurate as of Apr 14, 2021 10:10 AM. If you have any questions, ask your nurse or doctor.        acetaminophen 650 MG CR tablet Commonly known as: TYLENOL Take 650 mg by mouth daily.   ascorbic acid 500 MG tablet Commonly known as: VITAMIN C Take 500 mg by mouth daily.   aspirin 81 MG tablet Take 81 mg by mouth daily.   baclofen 10 MG tablet Commonly known as: LIORESAL Take 10 mg by mouth 3 (three) times daily as needed.   Biotin 1000 MCG Chew Chew by mouth.   blood glucose meter kit and supplies Kit Dispense based on patient and  insurance preference. Use up to four times daily as directed. (FOR ICD-9 250.00, 250.01).   brimonidine 0.2 % ophthalmic solution Commonly known as: ALPHAGAN Place 1 drop into both eyes 2 (two) times a day.   ferrous sulfate 325 (65 FE) MG tablet Take 325 mg by mouth daily with breakfast.   furosemide 20 MG tablet Commonly known as: LASIX Take 20 mg by mouth daily.   gabapentin 300 MG capsule Commonly known as: NEURONTIN TAKE 1 CAPSULE BY MOUTH THREE TIMES DAILY ,  MAY  TAKE  AN  ADDITIONAL  CAPSULE  IN  THE  EVENING What changed: See the new instructions.   glipiZIDE 5 MG tablet Commonly known as: GLUCOTROL Take 1.5 tablets (7.5 mg total) by mouth daily before breakfast.   glucose blood test strip Commonly known as: ONE TOUCH ULTRA TEST USE AS DIRECTED TO CHECK BLOOD SUGAR 2-3 TIMES A DAY Pt will need an OV for more refills   ketorolac 0.5 % ophthalmic solution Commonly known as: ACULAR INSTILL 1 DROP IN RIGHT EYE FOUR TIMES DAILY   latanoprost 0.005 % ophthalmic solution Commonly known as: XALATAN Place 1 drop into both eyes daily.   losartan 50 MG tablet Commonly known as: COZAAR Take 50 mg by mouth daily.   losartan-hydrochlorothiazide 50-12.5 MG tablet Commonly known as: HYZAAR Take 1 tablet by mouth daily.   Magnesium 250 MG Tabs Take 1 tablet by mouth daily.   metoprolol succinate 25 MG 24 hr tablet Commonly known as: TOPROL-XL Take 25 mg by mouth daily.   montelukast 10 MG tablet Commonly known as: SINGULAIR Take 10 mg by mouth at bedtime.   omeprazole 40 MG capsule Commonly known as: PRILOSEC Take 1 capsule (40 mg total) by mouth daily. Must keep appt on 12/06/16 with Dr. Ricky Ala Lancets 39Q Misc Use as instructed to check blood sugar 2-3 times a day.  DX E11.9   pioglitazone 15 MG tablet Commonly known as: ACTOS Take 15 mg by mouth daily.   pravastatin 20 MG tablet Commonly known as: PRAVACHOL TAKE 1 TABLET BY MOUTH ONCE  DAILY   timolol 0.5 % ophthalmic solution Commonly known as: TIMOPTIC Place 1 drop into both eyes 2 (two) times a day.   Trulicity 3 ZE/0.9QZ Sopn Generic drug: Dulaglutide Inject 0.5 mLs (3 mg total) as directed once a week.   Vitamin D-3 125 MCG (5000 UT) Tabs Take 1 tablet by mouth daily.   zinc gluconate 50 MG tablet Take 50 mg by mouth daily.        OBJECTIVE:   Vital Signs: BP 140/72   Pulse 80   Ht '5\' 2"'  (1.575  m)   Wt 209 lb 2 oz (94.9 kg)   SpO2 98%   BMI 38.25 kg/m   Wt Readings from Last 3 Encounters:  04/14/21 209 lb 2 oz (94.9 kg)  11/04/20 208 lb 8 oz (94.6 kg)  06/24/20 (!) 209 lb 9.6 oz (95.1 kg)     Exam: General: Pt appears well and is in NAD  Lungs: Clear with good BS bilat with no rales, rhonchi, or wheezes  Heart: RRR with normal S1 and S2 and no gallops; no murmurs; no rub  Extremities: No pretibial edema.  Neuro: MS is good with appropriate affect, pt is alert and Ox3    DM foot exam:   04/14/2021  The skin of the feet is intact without sores or ulcerations. The pedal pulses are 1+ on right and 1+ on left. The sensation is intact to a screening 5.07, 10 gram monofilament bilaterally    DATA REVIEWED:  Lab Results  Component Value Date   HGBA1C 7.9 (H) 06/01/2019   HGBA1C 7.0 07/19/2017   HGBA1C 7.0 (H) 04/06/2017   Lab Results  Component Value Date   MICROALBUR 0.4 04/21/2017   LDLCALC 77 05/31/2019   CREATININE 1.19 (H) 06/03/2019   Lab Results  Component Value Date   MICRALBCREAT 10 04/21/2017     Lab Results  Component Value Date   CHOL 140 05/31/2019   HDL 42 05/31/2019   LDLCALC 77 05/31/2019   LDLDIRECT 76.0 12/30/2016   TRIG 105 05/31/2019   CHOLHDL 3.3 05/31/2019         ASSESSMENT / PLAN / RECOMMENDATIONS:   1) Type 2 Diabetes Mellitus, optimally controlled, With neuropathic complications - Most recent A1c of 6.6 %. Goal A1c <7.0%.   -A1c remains at goal , she was noted with occasional night time  hyperglycemia, she admits to snacks at times on cheetos etc  - I have counseled her abut low carb snacks  - Tolerating medications well without side effects, no changes     MEDICATIONS:  Continue glipizide 5 mg, 1 tablet before Breakfast and half a tablet before supper  Continue pioglitazone 15 mg, 1 tablet daily  Continue Trulicity 3 mg weekly  EDUCATION / INSTRUCTIONS:  BG monitoring instructions: Patient is instructed to check her blood sugars 2 times a day, fasting and suppertime.  Call [facility] if: BG persistently < 70  . I reviewed the Rule of 15 for the treatment of hypoglycemia in detail with the patient. Literature supplied.    2) Diabetic complications:   Eye: Does not have known diabetic retinopathy.   Neuro/ Feet: Does  have known diabetic peripheral neuropathy.  Renal: Patient does not have known baseline CKD. She is  on an ACEI/ARB at present.  F/U in 6 months   Signed electronically by: Mack Guise, MD  [facility]  [facility] [address] [place], [place] [address] Phone: [phone] FAX: [phone]   CC: Sandi Mariscal, [place] [address] Phone: [phone]  Fax: [phone]  Return to Endocrinology clinic as below: Future Appointments  Date Time Provider [place]  05/30/2021  2:00 PM Elise Benne [facility]  02/12/2022 10:45 AM Rankin, Clent Demark, MD RDE-RDE None

## 2021-05-01 ENCOUNTER — Ambulatory Visit: Payer: HMO | Admitting: Internal Medicine

## 2021-05-08 DIAGNOSIS — R079 Chest pain, unspecified: Secondary | ICD-10-CM | POA: Diagnosis not present

## 2021-05-30 ENCOUNTER — Ambulatory Visit: Payer: HMO | Admitting: Medical

## 2021-06-18 DIAGNOSIS — C50911 Malignant neoplasm of unspecified site of right female breast: Secondary | ICD-10-CM | POA: Diagnosis not present

## 2021-06-23 DIAGNOSIS — I10 Essential (primary) hypertension: Secondary | ICD-10-CM | POA: Diagnosis not present

## 2021-06-23 DIAGNOSIS — Z79899 Other long term (current) drug therapy: Secondary | ICD-10-CM | POA: Diagnosis not present

## 2021-06-23 DIAGNOSIS — M545 Low back pain, unspecified: Secondary | ICD-10-CM | POA: Diagnosis not present

## 2021-06-23 DIAGNOSIS — G8929 Other chronic pain: Secondary | ICD-10-CM | POA: Diagnosis not present

## 2021-06-23 DIAGNOSIS — E119 Type 2 diabetes mellitus without complications: Secondary | ICD-10-CM | POA: Diagnosis not present

## 2021-06-26 ENCOUNTER — Other Ambulatory Visit: Payer: Self-pay

## 2021-06-27 ENCOUNTER — Ambulatory Visit (INDEPENDENT_AMBULATORY_CARE_PROVIDER_SITE_OTHER): Payer: HMO | Admitting: Medical

## 2021-06-27 ENCOUNTER — Telehealth: Payer: Self-pay

## 2021-06-27 VITALS — BP 137/80 | HR 90 | Temp 98.3°F | Ht 62.0 in | Wt 207.8 lb

## 2021-06-27 DIAGNOSIS — G8929 Other chronic pain: Secondary | ICD-10-CM | POA: Diagnosis not present

## 2021-06-27 DIAGNOSIS — M4800 Spinal stenosis, site unspecified: Secondary | ICD-10-CM

## 2021-06-27 DIAGNOSIS — E119 Type 2 diabetes mellitus without complications: Secondary | ICD-10-CM | POA: Diagnosis not present

## 2021-06-27 DIAGNOSIS — M5440 Lumbago with sciatica, unspecified side: Secondary | ICD-10-CM | POA: Diagnosis not present

## 2021-06-27 DIAGNOSIS — E785 Hyperlipidemia, unspecified: Secondary | ICD-10-CM

## 2021-06-27 DIAGNOSIS — I10 Essential (primary) hypertension: Secondary | ICD-10-CM

## 2021-06-27 DIAGNOSIS — N1831 Chronic kidney disease, stage 3a: Secondary | ICD-10-CM | POA: Diagnosis not present

## 2021-06-27 NOTE — Progress Notes (Signed)
Subjective:    Patient ID: Donna Fuller, female    DOB: January 26, 1941, 80 y.o.   MRN: [id number]  HPI  Pt in for first time with me. Saw Debbrah Alar in the past. She states left practice since Dr Nancy Fetter was closer.  No coming back to [place].  "Pt states she is no nonsense person"  Pt state she was placed with me and well see how it works.   Pt has diabetes. Pt states recent tried to get a1c and blood draw failed. Pt states her sugars have been controlled in past. Pt is on glipizide, trulicity and actos. Pt is seeing endocrinogist Dr. Kelton Pillar.  Htn history. She is on toprol xl 25 mg dailyand  losatan 6m  Pt wants to be off losartan. Pt on diuretic from cardiologist. I can't see [facility] record.  Pt has high cholesterol. She is on statin.   Pt has hx of spinal stenosis, herniated disc, curved spine and some scaitica signs and symptoms. Years ago states considered surgery but when she got breast CA in 2007 back surgery was cancelled. Pt has chronic daily lower back pain. Pt thinsk prior practice did not address this. Pt was given some narcotics in the past for this. Pt states she does not like to take meds. Last rx that I see in epic for pain in January. Pt just got other prescriptoin from former pcp. She states same med pending at her pharmacy.  See 04/24/2017 mri.   She has some neck pain with pain radiating down her neck.  Some back pain that radiates down left leg.   Pt is on gabapentin 100 mg tid.  Pt also on baclofen 10 mg once daily at night.   Pt has just seen PT in January.      Review of Systems  Constitutional:  Negative for chills, fatigue and fever.  Respiratory:  Negative for cough, chest tightness, shortness of breath and wheezing.   Cardiovascular:  Negative for chest pain and palpitations.  Gastrointestinal:  Negative for abdominal pain.  Genitourinary:  Negative for difficulty urinating, dysuria, flank pain, frequency and hematuria.   Musculoskeletal:  Positive for back pain.  Skin:  Negative for rash.  Neurological:  Negative for dizziness, numbness and headaches.  Hematological:  Negative for adenopathy. Does not bruise/bleed easily.  Psychiatric/Behavioral:  Negative for behavioral problems, decreased concentration and dysphoric mood. The patient is not nervous/anxious.    Past Medical History:  Diagnosis Date   Arthritis    Breast cancer ([facility] 2007   Right breast, s/p mastectomy   Diabetes mellitus without complication ([facility])    GERD (gastroesophageal reflux disease)    Gout    Hyperlipidemia    Hypertension    Spinal stenosis      Social History   Socioeconomic History   Marital status: Married    Spouse name: Jimmy   Number of children: 3   Years of education: 12   Highest education level: Not on file  Occupational History   Not on file  Tobacco Use   Smoking status: Former    Packs/day: 2.00    Years: 5.00    Pack years: 10.00    Types: Cigarettes   Smokeless tobacco: Never  Vaping Use   Vaping Use: Never used  Substance and Sexual Activity   Alcohol use: No   Drug use: No   Sexual activity: Not Currently  Other Topics Concern   Not on file  Social History Narrative  Marital Status:  Married Manufacturing systems engineer)    Children:  Daughter(1) Son (1)    Pets:  None    Living Situation: Lives with husband and daughter.     Occupation:  Retired Licensed conveyancer)    Education: 12th Grade    Tobacco Use/Exposure:  She used to smoke socially during the weekends but quit 40 years ago.    Alcohol Use:  Occasional   Drug Use:  None   Diet:  Regular   Exercise:  None   Hobbies:  Reading and playing volleyball             Social Determinants of Health   Financial Resource Strain: Not on file  Food Insecurity: Not on file  Transportation Needs: Not on file  Physical Activity: Not on file  Stress: Not on file  Social Connections: Not on file  Intimate Partner Violence: Not on file    Past Surgical History:   Procedure Laterality Date   ABDOMINAL HYSTERECTOMY     BREAST BIOPSY Left    2016 benign    BREAST LUMPECTOMY     KNEE ARTHROSCOPY     MASTECTOMY     right side   TUBAL LIGATION      Family History  Problem Relation Age of Onset   Diabetes Mother    Alzheimer's disease Mother    Heart disease Father    Hyperlipidemia Father    Kidney disease Father    Cancer Brother        lung   Alzheimer's disease Brother    Heart disease Brother    Diabetes Sister    Diabetes Brother    Diabetes Brother     Allergies  Allergen Reactions   Penicillins Swelling   Sulfa Antibiotics Swelling   Amlodipine Swelling    LE Swelling   Lisinopril Cough    Current Outpatient Medications on File Prior to Visit  Medication Sig Dispense Refill   acetaminophen (TYLENOL) 650 MG CR tablet Take 650 mg by mouth daily.     ascorbic acid (VITAMIN C) 500 MG tablet Take 500 mg by mouth daily.     aspirin 81 MG tablet Take 81 mg by mouth daily.     baclofen (LIORESAL) 10 MG tablet Take 10 mg by mouth 3 (three) times daily as needed.     Biotin 1000 MCG CHEW Chew by mouth.     blood glucose meter kit and supplies KIT Dispense based on patient and insurance preference. Use up to four times daily as directed. (FOR ICD-9 250.00, 250.01). 1 each 11   brimonidine (ALPHAGAN) 0.2 % ophthalmic solution Place 1 drop into both eyes 2 (two) times a day.     Cholecalciferol (VITAMIN D-3) 5000 units TABS Take 1 tablet by mouth daily.     Dulaglutide (TRULICITY) 3 EK/8.0KL SOPN Inject 0.5 mLs (3 mg total) as directed once a week. 12 pen 3   ferrous sulfate 325 (65 FE) MG tablet Take 325 mg by mouth daily with breakfast.     furosemide (LASIX) 20 MG tablet Take 20 mg by mouth daily.     gabapentin (NEURONTIN) 300 MG capsule TAKE 1 CAPSULE BY MOUTH THREE TIMES DAILY ,  MAY  TAKE  AN  ADDITIONAL  CAPSULE  IN  THE  EVENING (Patient taking differently: Take 300 mg by mouth See admin instructions. Take 331m three times  daily, may take an additional 3084min the evening.) 100 capsule 0   glipiZIDE (GLUCOTROL) 5 MG tablet Take  1.5 tablets (7.5 mg total) by mouth daily before breakfast. 135 tablet 2   glucose blood (ONE TOUCH ULTRA TEST) test strip USE AS DIRECTED TO CHECK BLOOD SUGAR 2-3 TIMES A DAY Pt will need an OV for more refills 100 each 0   ketorolac (ACULAR) 0.5 % ophthalmic solution INSTILL 1 DROP IN RIGHT EYE FOUR TIMES DAILY 5 mL 6   latanoprost (XALATAN) 0.005 % ophthalmic solution Place 1 drop into both eyes daily.     losartan (COZAAR) 50 MG tablet Take 50 mg by mouth daily.     losartan-hydrochlorothiazide (HYZAAR) 50-12.5 MG tablet Take 1 tablet by mouth daily.     Magnesium 250 MG TABS Take 1 tablet by mouth daily.     metoprolol succinate (TOPROL-XL) 25 MG 24 hr tablet Take 25 mg by mouth daily.     montelukast (SINGULAIR) 10 MG tablet Take 10 mg by mouth at bedtime.     omeprazole (PRILOSEC) 40 MG capsule Take 1 capsule (40 mg total) by mouth daily. Must keep appt on 12/06/16 with Dr. Caryl Bis 90 capsule 0   [facility] DELICA LANCETS 55V MISC Use as instructed to check blood sugar 2-3 times a day.  DX E11.9 100 each 3   pioglitazone (ACTOS) 15 MG tablet Take 15 mg by mouth daily.     pravastatin (PRAVACHOL) 20 MG tablet TAKE 1 TABLET BY MOUTH ONCE DAILY (Patient taking differently: Take 20 mg by mouth daily.) 90 tablet 0   timolol (TIMOPTIC) 0.5 % ophthalmic solution Place 1 drop into both eyes 2 (two) times a day.     zinc gluconate 50 MG tablet Take 50 mg by mouth daily.     No current facility-administered medications on file prior to visit.    BP (!) 140/59   Pulse 90   Temp 98.3 F (36.8 C)   Ht _0  (1.575 m)   Wt 207 lb 12.8 oz (94.3 kg)   SpO2 97%   BMI 38.01 kg/m        Objective:   Physical Exam  General Appearance- Not in acute distress.    Chest and Lung Exam Auscultation: Breath sounds:-Normal. Clear even and unlabored. Adventitious sounds:- No Adventitious  sounds.  Cardiovascular Auscultation:Rythm - Regular, rate and rythm. Heart Sounds -Normal heart sounds.  Abdomen Inspection:-Inspection Normal.  Palpation/Perucssion: Palpation and Percussion of the abdomen reveal- Non Tender, No Rebound tenderness, No rigidity(Guarding) and No Palpable abdominal masses.  Liver:-Normal.  Spleen:- Normal.   Back Mid lumbar spine tenderness to palpation. Pain on straight leg lift. Pain on lateral movements and flexion/extension of the spine.  Lower ext neurologic  L5-S1 sensation intact bilaterally. Normal patellar reflexes bilaterally. No foot drop bilaterally.       Assessment & Plan:   Htn. Fair control. Would like to be closer to 130/80.  Continue current BP medications.  Would asked that you check and see which medication your cardiologist have you recently.  Call our office with an update.  For diabetes continue current diabetic medications.  We will get Z4M and metabolic panel today.   For history of hyperlipidemia continue statin.  Since you are not fasting we will get fasting lipid panel later.  For history of chronic back pain with significant findings on prior MRI continue current medication regimen prescribed by former PCP.  We will go ahead and get opinion/referral to sports medicine MD.  Follow-up date to be determined after lab review.  Mackie Pai, PA-C   Time spent with patient  today was  50 minutes which consisted of chart review, discussing diagnosis, work up, treatment and documentation.

## 2021-06-27 NOTE — Patient Instructions (Addendum)
[place] control. Would like to be closer to 130/80.  Continue current BP medications.  Would asked that you check and see which medication your cardiologist have you recently.  Call our office with an update.  For diabetes continue current diabetic medications.  We will get [id number] and metabolic panel today.   For history of hyperlipidemia continue statin.  Since you are not fasting we will get fasting lipid panel later.  For history of chronic back pain with significant findings on prior MRI continue current medication regimen prescribed by former PCP.  We will go ahead and get opinion/referral to sports medicine MD.  Follow-up date to be determined after lab review.

## 2021-06-27 NOTE — Telephone Encounter (Signed)
Patient called and stated the name of the medication is     triamterene 37.'5mg'$  Donna Fuller

## 2021-06-27 NOTE — Addendum Note (Signed)
Addended by: Legrand Rams on: 06/27/2021 03:13 PM   Modules accepted: Orders

## 2021-06-28 LAB — HEMOGLOBIN A1C
Hgb A1c MFr Bld: 6.2 % of total Hgb — ABNORMAL HIGH (ref <5.7)
Mean Plasma Glucose: 131 mg/dL
eAG (mmol/L): 7.3 mmol/L

## 2021-06-28 LAB — COMPREHENSIVE METABOLIC PANEL
AG Ratio: 1.4 (calc) (ref 1.0–2.5)
ALT: 11 U/L (ref 6–29)
AST: 16 U/L (ref 10–35)
Albumin: 4.4 g/dL (ref 3.6–5.1)
Alkaline phosphatase (APISO): 133 U/L (ref 37–153)
BUN/Creatinine Ratio: 17 (calc) (ref 6–22)
BUN: 24 mg/dL (ref 7–25)
CO2: 26 mmol/L (ref 20–32)
Calcium: 9.8 mg/dL (ref 8.6–10.4)
Chloride: 103 mmol/L (ref 98–110)
Creat: 1.4 mg/dL — ABNORMAL HIGH (ref 0.60–1.00)
Globulin: 3.1 g/dL (calc) (ref 1.9–3.7)
Glucose, Bld: 90 mg/dL (ref 65–99)
Potassium: 4.7 mmol/L (ref 3.5–5.3)
Sodium: 140 mmol/L (ref 135–146)
Total Bilirubin: 0.4 mg/dL (ref 0.2–1.2)
Total Protein: 7.5 g/dL (ref 6.1–8.1)

## 2021-06-30 ENCOUNTER — Other Ambulatory Visit: Payer: Self-pay

## 2021-06-30 NOTE — Telephone Encounter (Signed)
Rx added to medication list

## 2021-07-08 ENCOUNTER — Encounter: Payer: Self-pay | Admitting: Family Medicine

## 2021-07-08 ENCOUNTER — Other Ambulatory Visit: Payer: Self-pay

## 2021-07-08 ENCOUNTER — Ambulatory Visit (INDEPENDENT_AMBULATORY_CARE_PROVIDER_SITE_OTHER): Payer: HMO | Admitting: Family Medicine

## 2021-07-08 VITALS — BP 130/80 | Ht 62.0 in | Wt 207.0 lb

## 2021-07-08 DIAGNOSIS — M4124 Other idiopathic scoliosis, thoracic region: Secondary | ICD-10-CM | POA: Diagnosis not present

## 2021-07-08 DIAGNOSIS — M25542 Pain in joints of left hand: Secondary | ICD-10-CM | POA: Diagnosis not present

## 2021-07-08 DIAGNOSIS — M25541 Pain in joints of right hand: Secondary | ICD-10-CM | POA: Insufficient documentation

## 2021-07-08 DIAGNOSIS — M47817 Spondylosis without myelopathy or radiculopathy, lumbosacral region: Secondary | ICD-10-CM

## 2021-07-08 DIAGNOSIS — M5416 Radiculopathy, lumbar region: Secondary | ICD-10-CM

## 2021-07-08 DIAGNOSIS — E2839 Other primary ovarian failure: Secondary | ICD-10-CM | POA: Insufficient documentation

## 2021-07-08 NOTE — Patient Instructions (Signed)
Nice to meet you Please try heat  Please try the exercises  I will call with the lab results  Please schedule the bone density   Please try the rub on medicine  Please send me a message in MyChart with any questions or updates.  Please see me back in 3 weeks.   --Dr. Raeford Razor

## 2021-07-08 NOTE — Progress Notes (Signed)
Donna Fuller - 81 y.o. female MRN [id number]  Date of birth: 1941-08-03  SUBJECTIVE:  Including CC & ROS.  No chief complaint on file.   Donna Fuller is a 80 y.o. female that is presenting with acute on chronic low back pain with left-sided radicular pain.  She is currently taking gabapentin.  She has taken Norco as well.  Has been through physical therapy and tried bracing.  She does not want to consider surgery.  Her father did have gout.  Independent review of the MRI of the lumbar spine from 2018 is showing multiple levels of facet arthropathy and broad based disc bulging.   Review of Systems See HPI   HISTORY: Past Medical, Surgical, Social, and Family History Reviewed & Updated per EMR.   Pertinent Historical Findings include:  Past Medical History:  Diagnosis Date   Arthritis    Breast cancer ([facility]) 2007   Right breast, s/p mastectomy   Diabetes mellitus without complication ([place])    GERD (gastroesophageal reflux disease)    Gout    Hyperlipidemia    Hypertension    Spinal stenosis     Past Surgical History:  Procedure Laterality Date   ABDOMINAL HYSTERECTOMY     BREAST BIOPSY Left    2016 benign    BREAST LUMPECTOMY     KNEE ARTHROSCOPY     MASTECTOMY     right side   TUBAL LIGATION      Family History  Problem Relation Age of Onset   Diabetes Mother    Alzheimer's disease Mother    Heart disease Father    Hyperlipidemia Father    Kidney disease Father    Cancer Brother        lung   Alzheimer's disease Brother    Heart disease Brother    Diabetes Sister    Diabetes Brother    Diabetes Brother     Social History   Socioeconomic History   Marital status: Married    Spouse name: Jimmy   Number of children: 3   Years of education: 12   Highest education level: Not on file  Occupational History   Not on file  Tobacco Use   Smoking status: Former    Packs/day: 2.00    Years: 5.00    Pack years: 10.00    Types: Cigarettes   Smokeless  tobacco: Never  Vaping Use   Vaping Use: Never used  Substance and Sexual Activity   Alcohol use: No   Drug use: No   Sexual activity: Not Currently  Other Topics Concern   Not on file  Social History Narrative   Marital Status:  Married Manufacturing systems engineer)    Children:  Daughter(1) Son (1)    Pets:  None    Living Situation: Lives with husband and daughter.     Occupation:  Retired Licensed conveyancer)    Education: 12th Grade    Tobacco Use/Exposure:  She used to smoke socially during the weekends but quit 40 years ago.    Alcohol Use:  Occasional   Drug Use:  None   Diet:  Regular   Exercise:  None   Hobbies:  Reading and playing volleyball             Social Determinants of Health   Financial Resource Strain: Not on file  Food Insecurity: Not on file  Transportation Needs: Not on file  Physical Activity: Not on file  Stress: Not on file  Social Connections: Not on file  Intimate Partner Violence: Not on file     PHYSICAL EXAM:  VS: BP 130/80 (BP Location: Left Arm, Patient Position: Sitting, Cuff Size: Large)   Ht '5\' 2"'$  (1.575 m)   Wt 207 lb (93.9 kg)   BMI 37.86 kg/m  Physical Exam Gen: NAD, alert, cooperative with exam, well-appearing MSK:  Back: Has a lean to the left at baseline. She does correct into more of a neutral spine when she goes into flexion. Normal range of motion. Neurovascularly intact     ASSESSMENT & PLAN:   Facet arthropathy, lumbosacral Acute on chronic in nature.  -Counseled on home exercise therapy and supportive care. -Could consider facet injections.   Lumbar radiculopathy Having component of radicular pain and is currently being treated with gabapentin.  Has foraminal stenosis.  Has tried epidurals in the past.  She has not wanting to try injections if she can avoid it. -Counseled on home exercise therapy and supportive care. -Could consider facet injections or repeat epidural.  Other idiopathic scoliosis, thoracic region Her postural changes  are likely related to why her symptoms have gotten significantly worse as of late.  Has tried physical therapy in January. -We will try physical therapy with Rex[facility] Agent.  Estrogen deficiency Previous bone density was normal a few years ago. -Check vitamin D and  bone density  Arthralgia of both hands Having some arthralgias in different areas.  Has a family history of inflammatory disorder. -Uric acid, ANA, sed rate, CRP -Provided [place]ns[place] samples.

## 2021-07-08 NOTE — Assessment & Plan Note (Addendum)
Having some arthralgias in different areas.  Has a family history of inflammatory disorder. -Uric acid, ANA, sed rate, CRP -Provided Pennsaid samples.

## 2021-07-08 NOTE — Assessment & Plan Note (Addendum)
Acute on chronic in nature.  -Counseled on home exercise therapy and supportive care. -Could consider facet injections.

## 2021-07-08 NOTE — Assessment & Plan Note (Signed)
Having component of radicular pain and is currently being treated with gabapentin.  Has foraminal stenosis.  Has tried epidurals in the past.  She has not wanting to try injections if she can avoid it. -Counseled on home exercise therapy and supportive care. -Could consider facet injections or repeat epidural.

## 2021-07-08 NOTE — Progress Notes (Signed)
Medication Samples have been provided to the patient.  Drug name: Pennsaid       Strength: 2%        Qty: 2 boxes LOT: [id number]  Exp.Date: 05/2022  Dosing instructions: use a pea size amount   The patient has been instructed regarding the correct time, dose, and frequency of taking this medication, including desired effects and most common side effects.   Donna Fuller 11:59 AM 07/08/2021

## 2021-07-08 NOTE — Assessment & Plan Note (Signed)
Her postural changes are likely related to why her symptoms have gotten significantly worse as of late.  Has tried physical therapy in January. -We will try physical therapy with Rexene Agent.

## 2021-07-08 NOTE — Assessment & Plan Note (Signed)
Previous bone density was normal a few years ago. -Check vitamin D and  bone density

## 2021-07-11 DIAGNOSIS — E113292 Type 2 diabetes mellitus with mild nonproliferative diabetic retinopathy without macular edema, left eye: Secondary | ICD-10-CM | POA: Diagnosis not present

## 2021-07-11 DIAGNOSIS — H04123 Dry eye syndrome of bilateral lacrimal glands: Secondary | ICD-10-CM | POA: Diagnosis not present

## 2021-07-11 DIAGNOSIS — Z961 Presence of intraocular lens: Secondary | ICD-10-CM | POA: Diagnosis not present

## 2021-07-11 DIAGNOSIS — H401132 Primary open-angle glaucoma, bilateral, moderate stage: Secondary | ICD-10-CM | POA: Diagnosis not present

## 2021-07-15 LAB — SEDIMENTATION RATE: Sed Rate: 15 mm/hr (ref 0–40)

## 2021-07-15 LAB — ANA,IFA RA DIAG PNL W/RFLX TIT/PATN
ANA Titer 1: NEGATIVE
Cyclic Citrullin Peptide Ab: 5 units (ref 0–19)
Rheumatoid fact SerPl-aCnc: 41.8 IU/mL — ABNORMAL HIGH (ref <14.0)

## 2021-07-15 LAB — URIC ACID: Uric Acid: 6.3 mg/dL (ref 3.1–7.9)

## 2021-07-15 LAB — C-REACTIVE PROTEIN: CRP: 2 mg/L (ref 0–10)

## 2021-07-16 ENCOUNTER — Telehealth: Payer: Self-pay | Admitting: Family Medicine

## 2021-07-16 DIAGNOSIS — R768 Other specified abnormal immunological findings in serum: Secondary | ICD-10-CM

## 2021-07-16 NOTE — Telephone Encounter (Signed)
Informed of positive rheumatoid factor.  Will place referral to rheumatology for further evaluation.  Rosemarie Ax, MD [facility] 07/16/2021, 2:30 PM

## 2021-07-28 ENCOUNTER — Other Ambulatory Visit: Payer: Self-pay | Admitting: Internal Medicine

## 2021-07-28 DIAGNOSIS — E1142 Type 2 diabetes mellitus with diabetic polyneuropathy: Secondary | ICD-10-CM

## 2021-07-29 ENCOUNTER — Other Ambulatory Visit: Payer: Self-pay

## 2021-07-29 ENCOUNTER — Ambulatory Visit: Payer: HMO | Admitting: Family Medicine

## 2021-07-29 ENCOUNTER — Encounter: Payer: Self-pay | Admitting: Family Medicine

## 2021-07-29 VITALS — Ht 62.0 in | Wt 203.0 lb

## 2021-07-29 DIAGNOSIS — M47817 Spondylosis without myelopathy or radiculopathy, lumbosacral region: Secondary | ICD-10-CM

## 2021-07-29 DIAGNOSIS — M25542 Pain in joints of left hand: Secondary | ICD-10-CM | POA: Diagnosis not present

## 2021-07-29 DIAGNOSIS — Z96653 Presence of artificial knee joint, bilateral: Secondary | ICD-10-CM | POA: Diagnosis not present

## 2021-07-29 DIAGNOSIS — R768 Other specified abnormal immunological findings in serum: Secondary | ICD-10-CM | POA: Diagnosis not present

## 2021-07-29 DIAGNOSIS — M25541 Pain in joints of right hand: Secondary | ICD-10-CM

## 2021-07-29 MED ORDER — MELOXICAM 7.5 MG PO TABS: 7.5000 mg | ORAL_TABLET | Freq: Every day | ORAL | 1 refills | Status: DC | PRN

## 2021-07-29 NOTE — Progress Notes (Signed)
Donna Fuller - 80 y.o. female MRN [id number]  Date of birth: 02-Feb-1941  SUBJECTIVE:  Including CC & ROS.  No chief complaint on file.   Donna Fuller is a 80 y.o. female that is following up for her low back pain, hand pain and knee pain.  Her most recent lab work was demonstrating a positive rheumatoid factor.  She has not heard from the rheumatologist.  She continues to have pain.  Takes aspirin on a daily basis.   Review of Systems See HPI   HISTORY: Past Medical, Surgical, Social, and Family History Reviewed & Updated per EMR.   Pertinent Historical Findings include:  Past Medical History:  Diagnosis Date   Arthritis    Breast cancer ([place]) 2007   Right breast, s/p mastectomy   Diabetes mellitus without complication ([place])    GERD (gastroesophageal reflux disease)    Gout    Hyperlipidemia    Hypertension    Spinal stenosis     Past Surgical History:  Procedure Laterality Date   ABDOMINAL HYSTERECTOMY     BREAST BIOPSY Left    2016 benign    BREAST LUMPECTOMY     KNEE ARTHROSCOPY     MASTECTOMY     right side   TUBAL LIGATION      Family History  Problem Relation Age of Onset   Diabetes Mother    Alzheimer's disease Mother    Heart disease Father    Hyperlipidemia Father    Kidney disease Father    Cancer Brother        lung   Alzheimer's disease Brother    Heart disease Brother    Diabetes Sister    Diabetes Brother    Diabetes Brother     Social History   Socioeconomic History   Marital status: Married    Spouse name: Jimmy   Number of children: 3   Years of education: 12   Highest education level: Not on file  Occupational History   Not on file  Tobacco Use   Smoking status: Former    Packs/day: 2.00    Years: 5.00    Pack years: 10.00    Types: Cigarettes   Smokeless tobacco: Never  Vaping Use   Vaping Use: Never used  Substance and Sexual Activity   Alcohol use: No   Drug use: No   Sexual activity: Not Currently  Other  Topics Concern   Not on file  Social History Narrative   Marital Status:  Married Manufacturing systems engineer)    Children:  Daughter(1) Son (1)    Pets:  None    Living Situation: Lives with husband and daughter.     Occupation:  Retired Licensed conveyancer)    Education: 12th Grade    Tobacco Use/Exposure:  She used to smoke socially during the weekends but quit 40 years ago.    Alcohol Use:  Occasional   Drug Use:  None   Diet:  Regular   Exercise:  None   Hobbies:  Reading and playing volleyball             Social Determinants of Health   Financial Resource Strain: Not on file  Food Insecurity: Not on file  Transportation Needs: Not on file  Physical Activity: Not on file  Stress: Not on file  Social Connections: Not on file  Intimate Partner Violence: Not on file     PHYSICAL EXAM:  VS: Ht '5\' 2"'$  (1.575 m)   Wt 203 lb (92.1 kg)  BMI 37.13 kg/m  Physical Exam Gen: NAD, alert, cooperative with exam, well-appearing     ASSESSMENT & PLAN:   History of total bilateral knee replacement Having ongoing pain despite replacement.  May have a component of inflammatory portion causing her pain.  She has follow-up with the surgeon next month. -Could consider genicular nerve block.  Rheumatoid factor positive Observed in most recent lab work.  May be contributing to her ongoing pain in several joints. -Mobic. -Referral to rheumatology.  Arthralgia of both hands Acute on chronic in nature. -Counseled on home exercise therapy and supportive care. -Could consider physical therapy.  Facet arthropathy, lumbosacral Acute on chronic in nature.  Has known degenerative changes. -Could consider getting updated MRI for consideration of facet injections.

## 2021-07-29 NOTE — Assessment & Plan Note (Signed)
Having ongoing pain despite replacement.  May have a component of inflammatory portion causing her pain.  She has follow-up with the surgeon next month. -Could consider genicular nerve block.

## 2021-07-29 NOTE — Assessment & Plan Note (Signed)
Observed in most recent lab work.  May be contributing to her ongoing pain in several joints. -Mobic. -Referral to rheumatology.

## 2021-07-29 NOTE — Assessment & Plan Note (Signed)
Acute on chronic in nature. -Counseled on home exercise therapy and supportive care. -Could consider physical therapy.

## 2021-07-29 NOTE — Assessment & Plan Note (Signed)
Acute on chronic in nature.  Has known degenerative changes. -Could consider getting updated MRI for consideration of facet injections.

## 2021-07-29 NOTE — Patient Instructions (Signed)
Good to see you Please try the mobic as needed  Please see the rheumatologist. We can try a genicular nerve block for the knees but the pain is still ongoing Please send me a message in MyChart with any questions or updates.  Please see me back in 4 weeks.   --Dr. Raeford Razor

## 2021-07-31 ENCOUNTER — Other Ambulatory Visit: Payer: Self-pay

## 2021-07-31 ENCOUNTER — Ambulatory Visit (HOSPITAL_BASED_OUTPATIENT_CLINIC_OR_DEPARTMENT_OTHER)
Admission: RE | Admit: 2021-07-31 | Discharge: 2021-07-31 | Disposition: A | Discharge status: Home / Self Care | Payer: HMO | Source: Ambulatory Visit | Attending: Family Medicine | Admitting: Family Medicine

## 2021-07-31 DIAGNOSIS — E2839 Other primary ovarian failure: Secondary | ICD-10-CM | POA: Diagnosis not present

## 2021-07-31 DIAGNOSIS — Z780 Asymptomatic menopausal state: Secondary | ICD-10-CM | POA: Diagnosis not present

## 2021-08-01 ENCOUNTER — Telehealth: Payer: Self-pay | Admitting: Family Medicine

## 2021-08-01 NOTE — Telephone Encounter (Signed)
Patient called states has had some reaction to the Meloxican & read the pamphlet re: Side Effects of it.  --Pt request provider prescribe a diff pain medication.   --Forwarding message to med assit to review w/ Dr.Schmitz. & let patient know decision.  --glh

## 2021-08-01 NOTE — Telephone Encounter (Signed)
Informed of results.   Rosemarie Ax, MD [facility] 08/01/2021, 1:07 PM

## 2021-08-05 NOTE — Telephone Encounter (Signed)
Patient spoke with provider on 08/01/21 re: below.

## 2021-08-18 DIAGNOSIS — H04123 Dry eye syndrome of bilateral lacrimal glands: Secondary | ICD-10-CM | POA: Diagnosis not present

## 2021-08-18 DIAGNOSIS — E11311 Type 2 diabetes mellitus with unspecified diabetic retinopathy with macular edema: Secondary | ICD-10-CM | POA: Diagnosis not present

## 2021-08-18 DIAGNOSIS — Z794 Long term (current) use of insulin: Secondary | ICD-10-CM | POA: Diagnosis not present

## 2021-08-18 DIAGNOSIS — H401132 Primary open-angle glaucoma, bilateral, moderate stage: Secondary | ICD-10-CM | POA: Diagnosis not present

## 2021-08-29 ENCOUNTER — Encounter: Payer: Self-pay | Admitting: Family Medicine

## 2021-08-29 ENCOUNTER — Ambulatory Visit: Payer: HMO | Admitting: Family Medicine

## 2021-08-29 ENCOUNTER — Ambulatory Visit: Payer: HMO | Attending: Internal Medicine

## 2021-08-29 VITALS — Ht 62.0 in | Wt 203.0 lb

## 2021-08-29 DIAGNOSIS — M25541 Pain in joints of right hand: Secondary | ICD-10-CM | POA: Diagnosis not present

## 2021-08-29 DIAGNOSIS — R768 Other specified abnormal immunological findings in serum: Secondary | ICD-10-CM

## 2021-08-29 DIAGNOSIS — M25542 Pain in joints of left hand: Secondary | ICD-10-CM

## 2021-08-29 DIAGNOSIS — M47817 Spondylosis without myelopathy or radiculopathy, lumbosacral region: Secondary | ICD-10-CM

## 2021-08-29 DIAGNOSIS — Z23 Encounter for immunization: Secondary | ICD-10-CM

## 2021-08-29 NOTE — Assessment & Plan Note (Signed)
She did not tolerate the meloxicam. -Counseled on home exercise therapy and supportive care. -Counseled on Voltaren.

## 2021-08-29 NOTE — Assessment & Plan Note (Signed)
Continues to have low back pain and tends to lean forward and using a cane.  She is still getting her finances worked out from her previous physical therapy stent in January. -Counseled on home exercise therapy and supportive care. -Could consider physical therapy.

## 2021-08-29 NOTE — Progress Notes (Signed)
Donna Fuller - 80 y.o. female MRN [id number]  Date of birth: 19-Aug-1941  SUBJECTIVE:  Including CC & ROS.  No chief complaint on file.   Donna Fuller is a 80 y.o. female that is following up for her hand pain and back pain.  Her symptoms are intermittent in nature.  She had a reaction to the meloxicam and stopped it.  She has an appointment with rheumatology scheduled.   Review of Systems See HPI   HISTORY: Past Medical, Surgical, Social, and Family History Reviewed & Updated per EMR.   Pertinent Historical Findings include:  Past Medical History:  Diagnosis Date   Arthritis    Breast cancer ([place]) 2007   Right breast, s/p mastectomy   Diabetes mellitus without complication ([place])    GERD (gastroesophageal reflux disease)    Gout    Hyperlipidemia    Hypertension    Spinal stenosis     Past Surgical History:  Procedure Laterality Date   ABDOMINAL HYSTERECTOMY     BREAST BIOPSY Left    2016 benign    BREAST LUMPECTOMY     KNEE ARTHROSCOPY     MASTECTOMY     right side   TUBAL LIGATION      Family History  Problem Relation Age of Onset   Diabetes Mother    Alzheimer's disease Mother    Heart disease Father    Hyperlipidemia Father    Kidney disease Father    Cancer Brother        lung   Alzheimer's disease Brother    Heart disease Brother    Diabetes Sister    Diabetes Brother    Diabetes Brother     Social History   Socioeconomic History   Marital status: Married    Spouse name: Jimmy   Number of children: 3   Years of education: 12   Highest education level: Not on file  Occupational History   Not on file  Tobacco Use   Smoking status: Former    Packs/day: 2.00    Years: 5.00    Pack years: 10.00    Types: Cigarettes   Smokeless tobacco: Never  Vaping Use   Vaping Use: Never used  Substance and Sexual Activity   Alcohol use: No   Drug use: No   Sexual activity: Not Currently  Other Topics Concern   Not on file  Social History  Narrative   Marital Status:  Married Manufacturing systems engineer)    Children:  Daughter(1) Son (1)    Pets:  None    Living Situation: Lives with husband and daughter.     Occupation:  Retired Licensed conveyancer)    Education: 12th Grade    Tobacco Use/Exposure:  She used to smoke socially during the weekends but quit 40 years ago.    Alcohol Use:  Occasional   Drug Use:  None   Diet:  Regular   Exercise:  None   Hobbies:  Reading and playing volleyball             Social Determinants of Health   Financial Resource Strain: Not on file  Food Insecurity: Not on file  Transportation Needs: Not on file  Physical Activity: Not on file  Stress: Not on file  Social Connections: Not on file  Intimate Partner Violence: Not on file     PHYSICAL EXAM:  VS: Ht 5\' 2"  (1.575 m)   Wt 203 lb (92.1 kg)   BMI 37.13 kg/m  Physical Exam Gen: NAD,  alert, cooperative with exam, well-appearing      ASSESSMENT & PLAN:   Rheumatoid factor positive Recent lab work was revealing for positive rheumatoid factor which could be playing a role in her pain. -Has rheumatology appointment scheduled.  Facet arthropathy, lumbosacral Continues to have low back pain and tends to lean forward and using a cane.  She is still getting her finances worked out from her previous physical therapy stent in January. -Counseled on home exercise therapy and supportive care. -Could consider physical therapy.  Arthralgia of both hands She did not tolerate the meloxicam. -Counseled on home exercise therapy and supportive care. -Counseled on Voltaren.

## 2021-08-29 NOTE — Progress Notes (Signed)
   Covid-19 Vaccination Clinic  Name:  Donna Fuller    MRN: [id number] DOB: 1941/02/21  08/29/2021  Donna Fuller was observed post Covid-19 immunization for 15 minutes without incident. She was provided with Vaccine Information Sheet and instruction to access the V-Safe system.   Donna Fuller was instructed to call 911 with any severe reactions post vaccine: Difficulty breathing  Swelling of face and throat  A fast heartbeat  A bad rash all over body  Dizziness and weakness

## 2021-08-29 NOTE — Assessment & Plan Note (Signed)
Recent lab work was revealing for positive rheumatoid factor which could be playing a role in her pain. -Has rheumatology appointment scheduled.

## 2021-09-09 ENCOUNTER — Other Ambulatory Visit (HOSPITAL_BASED_OUTPATIENT_CLINIC_OR_DEPARTMENT_OTHER): Payer: Self-pay

## 2021-09-09 MED ORDER — MODERNA COVID-19 BIVAL BOOSTER 50 MCG/0.5ML IM SUSP
INTRAMUSCULAR | 0 refills | Status: DC
  Filled 2021-09-09: qty 0.5, 1d supply, fill #0

## 2021-09-15 NOTE — Progress Notes (Deleted)
Office Visit Note  Patient: Donna Fuller             Date of Birth: 06-Aug-1941           MRN: [id number]             PCP: Ann Held, DO Referring: Rosemarie Ax, MD Visit Date: 09/25/2021 Occupation: _0 @  Subjective:  No chief complaint on file.   History of Present Illness: Donna Fuller is a 80 y.o. female ***   Activities of Daily Living:  Patient reports morning stiffness for *** {minute/hour:19697}.   Patient {ACTIONS;DENIES/REPORTS:21021675::"Denies"} nocturnal pain.  Difficulty dressing/grooming: {ACTIONS;DENIES/REPORTS:21021675::"Denies"} Difficulty climbing stairs: {ACTIONS;DENIES/REPORTS:21021675::"Denies"} Difficulty getting out of chair: {ACTIONS;DENIES/REPORTS:21021675::"Denies"} Difficulty using hands for taps, buttons, cutlery, and/or writing: {ACTIONS;DENIES/REPORTS:21021675::"Denies"}  No Rheumatology ROS completed.   PMFS History:  Patient Active Problem List   Diagnosis Date Noted   Rheumatoid factor positive 07/29/2021   History of total bilateral knee replacement 07/29/2021   Other idiopathic scoliosis, thoracic region 07/08/2021   Estrogen deficiency 07/08/2021   Arthralgia of both hands 07/08/2021   Moderate nonproliferative diabetic retinopathy of left eye ([place]) 02/11/2021   Pseudophakia 02/11/2021   Primary open angle glaucoma of both eyes, moderate stage 02/11/2021   Cystoid macular edema, right eye 02/11/2021   Type 2 diabetes mellitus with hyperglycemia, without long-term current use of insulin ([place]) 12/14/2019   Type 2 diabetes mellitus with diabetic polyneuropathy, without long-term current use of insulin ([place]) 12/14/2019   Pleuritic chest pain 05/30/2019   Fever 05/30/2019   Yeast vaginitis 07/19/2017   Urinary frequency 07/19/2017   CKD (chronic kidney disease) stage 3, GFR 30-59 ml/min ([facility]) 05/20/2017   Gout 05/20/2017   Hyperlipidemia 09/26/2016   Spinal stenosis 09/26/2016   History of breast cancer  09/26/2016   Diabetes mellitus type 2, controlled ([place]) 09/26/2016   GERD (gastroesophageal reflux disease) 09/26/2016   Facet arthropathy, lumbosacral 03/13/2015   Neuropathy 03/13/2015   Hypertension    Lumbar radiculopathy 01/08/2014    Past Medical History:  Diagnosis Date   Arthritis    Breast cancer ([place]) 2007   Right breast, s/p mastectomy   Diabetes mellitus without complication ([facility])    GERD (gastroesophageal reflux disease)    Gout    Hyperlipidemia    Hypertension    Spinal stenosis     Family History  Problem Relation Age of Onset   Diabetes Mother    Alzheimer's disease Mother    Heart disease Father    Hyperlipidemia Father    Kidney disease Father    Cancer Brother        lung   Alzheimer's disease Brother    Heart disease Brother    Diabetes Sister    Diabetes Brother    Diabetes Brother    Past Surgical History:  Procedure Laterality Date   ABDOMINAL HYSTERECTOMY     BREAST BIOPSY Left    2016 benign    BREAST LUMPECTOMY     KNEE ARTHROSCOPY     MASTECTOMY     right side   TUBAL LIGATION     Social History   Social History Narrative   Marital Status:  Married Manufacturing systems engineer)    Children:  Daughter(1) Son (1)    Pets:  None    Living Situation: Lives with husband and daughter.     Occupation:  Retired Licensed conveyancer)    Education: 12th Grade    Tobacco Use/Exposure:  She used to smoke socially during the weekends but  quit 40 years ago.    Alcohol Use:  Occasional   Drug Use:  None   Diet:  Regular   Exercise:  None   Hobbies:  Reading and playing volleyball             Immunization History  Administered Date(s) Administered   Influenza,inj,Quad PF,6+ Mos 08/30/2016   Moderna Covid-19 Vaccine Bivalent Booster 58yr & up 08/29/2021   Moderna Sars-Covid-2 Vaccination 12/25/2019, 01/11/2020, 02/05/2020     Objective: Vital Signs: There were no vitals taken for this visit.   Physical Exam   Musculoskeletal Exam: ***  CDAI Exam: CDAI Score:  -- Patient Global: --; Provider Global: -- Swollen: --; Tender: -- Joint Exam 09/25/2021   No joint exam has been documented for this visit   There is currently no information documented on the homunculus. Go to the Rheumatology activity and complete the homunculus joint exam.  Investigation: No additional findings.  Imaging: No results found.  Recent Labs: Lab Results  Component Value Date   WBC 8.4 06/03/2019   HGB 11.7 (L) 06/03/2019   PLT 249 06/03/2019   NA 140 06/27/2021   K 4.7 06/27/2021   CL 103 06/27/2021   CO2 26 06/27/2021   GLUCOSE 90 06/27/2021   BUN 24 06/27/2021   CREATININE 1.40 (H) 06/27/2021   BILITOT 0.4 06/27/2021   ALKPHOS 92 06/03/2019   AST 16 06/27/2021   ALT 11 06/27/2021   PROT 7.5 06/27/2021   ALBUMIN 2.5 (L) 06/03/2019   CALCIUM 9.8 06/27/2021   GFRAA 51 (L) 06/03/2019    Speciality Comments: No specialty comments available.  Procedures:  No procedures performed Allergies: Penicillins, Sulfa antibiotics, Amlodipine, and Lisinopril   Assessment / Plan:     Visit Diagnoses: Rheumatoid factor positive - 07/08/21: ANA negative, RF 41.8, anti-CCP 5, ESR 15, CRP 2, uric acid 6.3  Arthralgia of both hands  History of total bilateral knee replacement  Other idiopathic scoliosis, thoracic region  Facet arthropathy, lumbosacral  Lumbar radiculopathy  Primary hypertension  Neuropathy  Type 2 diabetes mellitus with diabetic polyneuropathy, without long-term current use of insulin ([facility])  Moderate nonproliferative diabetic retinopathy of left eye without macular edema associated with type 2 diabetes mellitus ([facility])  Stage 3a chronic kidney disease ([place]  Primary open angle glaucoma of both eyes, moderate stage  History of gastroesophageal reflux (GERD)  History of breast cancer  Estrogen deficiency  History of hyperlipidemia  Orders: No orders of the defined types were placed in this encounter.  No orders of the defined types  were placed in this encounter.   Face-to-face time spent with patient was *** minutes. Greater than 50% of time was spent in counseling and coordination of care.  Follow-Up Instructions: No follow-ups on file.   TOfilia Neas PA-C  Note - This record has been created using Dragon software.  Chart creation errors have been sought, but may not always  have been located. Such creation errors do not reflect on  the standard of medical care.

## 2021-09-25 ENCOUNTER — Ambulatory Visit: Payer: HMO | Admitting: Rheumatology

## 2021-09-25 DIAGNOSIS — R768 Other specified abnormal immunological findings in serum: Secondary | ICD-10-CM

## 2021-09-25 DIAGNOSIS — N1831 Chronic kidney disease, stage 3a: Secondary | ICD-10-CM

## 2021-09-25 DIAGNOSIS — M4124 Other idiopathic scoliosis, thoracic region: Secondary | ICD-10-CM

## 2021-09-25 DIAGNOSIS — Z853 Personal history of malignant neoplasm of breast: Secondary | ICD-10-CM

## 2021-09-25 DIAGNOSIS — M5416 Radiculopathy, lumbar region: Secondary | ICD-10-CM

## 2021-09-25 DIAGNOSIS — G629 Polyneuropathy, unspecified: Secondary | ICD-10-CM

## 2021-09-25 DIAGNOSIS — M25541 Pain in joints of right hand: Secondary | ICD-10-CM

## 2021-09-25 DIAGNOSIS — E1142 Type 2 diabetes mellitus with diabetic polyneuropathy: Secondary | ICD-10-CM

## 2021-09-25 DIAGNOSIS — Z8639 Personal history of other endocrine, nutritional and metabolic disease: Secondary | ICD-10-CM

## 2021-09-25 DIAGNOSIS — H401132 Primary open-angle glaucoma, bilateral, moderate stage: Secondary | ICD-10-CM

## 2021-09-25 DIAGNOSIS — Z8719 Personal history of other diseases of the digestive system: Secondary | ICD-10-CM

## 2021-09-25 DIAGNOSIS — I10 Essential (primary) hypertension: Secondary | ICD-10-CM

## 2021-09-25 DIAGNOSIS — E113392 Type 2 diabetes mellitus with moderate nonproliferative diabetic retinopathy without macular edema, left eye: Secondary | ICD-10-CM

## 2021-09-25 DIAGNOSIS — Z96653 Presence of artificial knee joint, bilateral: Secondary | ICD-10-CM

## 2021-09-25 DIAGNOSIS — E2839 Other primary ovarian failure: Secondary | ICD-10-CM

## 2021-09-25 DIAGNOSIS — M47817 Spondylosis without myelopathy or radiculopathy, lumbosacral region: Secondary | ICD-10-CM

## 2021-10-01 ENCOUNTER — Ambulatory Visit (INDEPENDENT_AMBULATORY_CARE_PROVIDER_SITE_OTHER): Payer: HMO

## 2021-10-01 VITALS — Ht 62.0 in | Wt 203.0 lb

## 2021-10-01 DIAGNOSIS — Z0000 Encounter for general adult medical examination without abnormal findings: Secondary | ICD-10-CM

## 2021-10-01 NOTE — Patient Instructions (Signed)
Donna Fuller , Thank you for taking time to complete your Medicare Wellness Visit. I appreciate your ongoing commitment to your health goals. Please review the following plan we discussed and let me know if I can assist you in the future.   Screening recommendations/referrals: Colonoscopy: No longer required Mammogram: Completed 12/25/2020-Due 12/25/2021 Bone Density: Completed 07/31/2021-Due 08/01/2023 Recommended yearly ophthalmology/optometry visit for glaucoma screening and checkup Recommended yearly dental visit for hygiene and checkup  Vaccinations: Influenza vaccine: Up to date Pneumococcal vaccine: Due-May obtain vaccine at our office or your local pharmacy. Tdap vaccine: Discuss wit pharmacy Shingles vaccine: Discuss with pharmacy   Covid-19:Up to date  Advanced directives: Please bring a copy of Living Will and/or Healthcare Power of Attorney for your chart.   Conditions/risks identified: See problem list  Next appointment: Follow up in one year for your annual wellness visit 10/05/2022 @ 1:00   Preventive Care 65 Years and Older, Female Preventive care refers to lifestyle choices and visits with your health care provider that can promote health and wellness. What does preventive care include? A yearly physical exam. This is also called an annual well check. Dental exams once or twice a year. Routine eye exams. Ask your health care provider how often you should have your eyes checked. Personal lifestyle choices, including: Daily care of your teeth and gums. Regular physical activity. Eating a healthy diet. Avoiding tobacco and drug use. Limiting alcohol use. Practicing safe sex. Taking low-dose aspirin every day. Taking vitamin and mineral supplements as recommended by your health care provider. What happens during an annual well check? The services and screenings done by your health care provider during your annual well check will depend on your age, overall health,  lifestyle risk factors, and family history of disease. Counseling  Your health care provider may ask you questions about your: Alcohol use. Tobacco use. Drug use. Emotional well-being. Home and relationship well-being. Sexual activity. Eating habits. History of falls. Memory and ability to understand (cognition). Work and work Statistician. Reproductive health. Screening  You may have the following tests or measurements: Height, weight, and BMI. Blood pressure. Lipid and cholesterol levels. These may be checked every 5 years, or more frequently if you are over 85 years old. Skin check. Lung cancer screening. You may have this screening every year starting at age 35 if you have a 30-pack-year history of smoking and currently smoke or have quit within the past 15 years. Fecal occult blood test (FOBT) of the stool. You may have this test every year starting at age 55. Flexible sigmoidoscopy or colonoscopy. You may have a sigmoidoscopy every 5 years or a colonoscopy every 10 years starting at age 21. Hepatitis C blood test. Hepatitis B blood test. Sexually transmitted disease (STD) testing. Diabetes screening. This is done by checking your blood sugar (glucose) after you have not eaten for a while (fasting). You may have this done every 1-3 years. Bone density scan. This is done to screen for osteoporosis. You may have this done starting at age 45. Mammogram. This may be done every 1-2 years. Talk to your health care provider about how often you should have regular mammograms. Talk with your health care provider about your test results, treatment options, and if necessary, the need for more tests. Vaccines  Your health care provider may recommend certain vaccines, such as: Influenza vaccine. This is recommended every year. Tetanus, diphtheria, and acellular pertussis (Tdap, Td) vaccine. You may need a Td booster every 10 years. Zoster vaccine. You  may need this after age  76. Pneumococcal 13-valent conjugate (PCV13) vaccine. One dose is recommended after age 63. Pneumococcal polysaccharide (PPSV23) vaccine. One dose is recommended after age 2. Talk to your health care provider about which screenings and vaccines you need and how often you need them. This information is not intended to replace advice given to you by your health care provider. Make sure you discuss any questions you have with your health care provider. Document Released: 12/13/2015 Document Revised: 08/05/2016 Document Reviewed: 09/17/2015 Elsevier Interactive Patient Education  2017 [place] Prevention in the Home Falls can cause injuries. They can happen to people of all ages. There are many things you can do to make your home safe and to help prevent falls. What can I do on the outside of my home? Regularly fix the edges of walkways and driveways and fix any cracks. Remove anything that might make you trip as you walk through a door, such as a raised step or threshold. Trim any bushes or trees on the path to your home. Use bright outdoor lighting. Clear any walking paths of anything that might make someone trip, such as rocks or tools. Regularly check to see if handrails are loose or broken. Make sure that both sides of any steps have handrails. Any raised decks and porches should have guardrails on the edges. Have any leaves, snow, or ice cleared regularly. Use sand or salt on walking paths during winter. Clean up any spills in your garage right away. This includes oil or grease spills. What can I do in the bathroom? Use night lights. Install grab bars by the toilet and in the tub and shower. Do not use towel bars as grab bars. Use non-skid mats or decals in the tub or shower. If you need to sit down in the shower, use a plastic, non-slip stool. Keep the floor dry. Clean up any water that spills on the floor as soon as it happens. Remove soap buildup in the tub or shower  regularly. Attach bath mats securely with double-sided non-slip rug tape. Do not have throw rugs and other things on the floor that can make you trip. What can I do in the bedroom? Use night lights. Make sure that you have a light by your bed that is easy to reach. Do not use any sheets or blankets that are too big for your bed. They should not hang down onto the floor. Have a firm chair that has side arms. You can use this for support while you get dressed. Do not have throw rugs and other things on the floor that can make you trip. What can I do in the kitchen? Clean up any spills right away. Avoid walking on wet floors. Keep items that you use a lot in easy-to-reach places. If you need to reach something above you, use a strong step stool that has a grab bar. Keep electrical cords out of the way. Do not use floor polish or wax that makes floors slippery. If you must use wax, use non-skid floor wax. Do not have throw rugs and other things on the floor that can make you trip. What can I do with my stairs? Do not leave any items on the stairs. Make sure that there are handrails on both sides of the stairs and use them. Fix handrails that are broken or loose. Make sure that handrails are as long as the stairways. Check any carpeting to make sure that it is firmly  attached to the stairs. Fix any carpet that is loose or worn. Avoid having throw rugs at the top or bottom of the stairs. If you do have throw rugs, attach them to the floor with carpet tape. Make sure that you have a light switch at the top of the stairs and the bottom of the stairs. If you do not have them, ask someone to add them for you. What else can I do to help prevent falls? Wear shoes that: Do not have high heels. Have rubber bottoms. Are comfortable and fit you well. Are closed at the toe. Do not wear sandals. If you use a stepladder: Make sure that it is fully opened. Do not climb a closed stepladder. Make sure that  both sides of the stepladder are locked into place. Ask someone to hold it for you, if possible. Clearly mark and make sure that you can see: Any grab bars or handrails. First and last steps. Where the edge of each step is. Use tools that help you move around (mobility aids) if they are needed. These include: Canes. Walkers. Scooters. Crutches. Turn on the lights when you go into a dark area. Replace any light bulbs as soon as they burn out. Set up your furniture so you have a clear path. Avoid moving your furniture around. If any of your floors are uneven, fix them. If there are any pets around you, be aware of where they are. Review your medicines with your doctor. Some medicines can make you feel dizzy. This can increase your chance of falling. Ask your doctor what other things that you can do to help prevent falls. This information is not intended to replace advice given to you by your health care provider. Make sure you discuss any questions you have with your health care provider. Document Released: 09/12/2009 Document Revised: 04/23/2016 Document Reviewed: 12/21/2014 Elsevier Interactive Patient Education  2017 [employer].

## 2021-10-01 NOTE — Progress Notes (Signed)
Subjective:   Donna Fuller is a 80 y.o. female who presents for Medicare Annual (Subsequent) preventive examination.  I connected with Shanon today by telephone and verified that I am speaking with the correct person using two identifiers. Location patient: home Location provider: work Persons participating in the virtual visit: patient, Marine scientist.    I discussed the limitations, risks, security and privacy concerns of performing an evaluation and management service by telephone and the availability of in person appointments. I also discussed with the patient that there may be a patient responsible charge related to this service. The patient expressed understanding and verbally consented to this telephonic visit.    Interactive audio and video telecommunications were attempted between this provider and patient, however failed, due to patient having technical difficulties OR patient did not have access to video capability.  We continued and completed visit with audio only.  Some vital signs may be absent or patient reported.   Time Spent with patient on telephone encounter: 25 minutes   Review of Systems     Cardiac Risk Factors include: advanced age (>60mn, >>82women);diabetes mellitus;dyslipidemia;hypertension;obesity (BMI >30kg/m2);sedentary lifestyle     Objective:    Today's Vitals   10/01/21 1420 10/01/21 1421  Weight: 203 lb (92.1 kg)   Height: '5\' 2"'  (1.575 m)   PainSc:  10-Worst pain ever   Body mass index is 37.13 kg/m.  Advanced Directives 10/01/2021 03/28/2020 05/31/2019 05/30/2019 05/11/2017 12/29/2016 02/14/2016  Does Patient Have a Medical Advance Directive? Yes Yes - No Yes No Yes  Type of AParamed[place]of [place] will [place] will - - Living will - Living will  Does patient want to make changes to medical advance directive? - No - Patient declined - - - - No - Patient declined  Copy of [place] Chart? No - copy requested No - copy requested - - - - No - copy requested  Would patient like information on creating a medical advance directive? - - No - Patient declined - - Yes (MAU/Ambulatory/Procedural Areas - Information given) -    Current Medications (verified) Outpatient Encounter Medications as of 10/01/2021  Medication Sig   acetaminophen (TYLENOL) 650 MG CR tablet Take 650 mg by mouth daily.   ascorbic acid (VITAMIN C) 500 MG tablet Take 500 mg by mouth daily.   aspirin 81 MG tablet Take 81 mg by mouth daily.   baclofen (LIORESAL) 10 MG tablet Take 10 mg by mouth 3 (three) times daily as needed.   Biotin 1000 MCG CHEW Chew by mouth.   blood glucose meter kit and supplies KIT Dispense based on patient and insurance preference. Use up to four times daily as directed. (FOR ICD-9 250.00, 250.01).   brimonidine (ALPHAGAN) 0.2 % ophthalmic solution Place 1 drop into both eyes 2 (two) times a day.   Cholecalciferol (VITAMIN D-3) 5000 units TABS Take 1 tablet by mouth daily.   COVID-19 mRNA bivalent vaccine, Moderna, (MODERNA COVID-19 BIVAL BOOSTER) 50 MCG/0.5ML injection Inject into the muscle.   Dulaglutide (TRULICITY) 3 MYS/0.6TKSOPN Inject 0.5 mLs (3 mg total) as directed once a week.   ferrous sulfate 325 (65 FE) MG tablet Take 325 mg by mouth daily with breakfast.   furosemide (LASIX) 20 MG tablet Take 20 mg by mouth daily.   gabapentin (NEURONTIN) 300 MG capsule TAKE 1 CAPSULE BY MOUTH THREE TIMES DAILY ,  MAY  TAKE  AN  ADDITIONAL  CAPSULE  IN  THE  EVENING (Patient taking differently: Take 300 mg by mouth See admin instructions. Take 361m three times daily, may take an additional 3075min the evening.)   glipiZIDE (GLUCOTROL) 5 MG tablet TAKE 1 AND 1/2 TABLETS(7.5 MG) BY MOUTH DAILY BEFORE BREAKFAST   glucose blood (ONE TOUCH ULTRA TEST) test strip USE AS DIRECTED TO CHECK BLOOD SUGAR 2-3 TIMES A DAY Pt will need an OV for more refills   ketorolac (ACULAR) 0.5 %  ophthalmic solution INSTILL 1 DROP IN RIGHT EYE FOUR TIMES DAILY   latanoprost (XALATAN) 0.005 % ophthalmic solution Place 1 drop into both eyes daily.   losartan (COZAAR) 50 MG tablet Take 50 mg by mouth daily.   losartan-hydrochlorothiazide (HYZAAR) 50-12.5 MG tablet Take 1 tablet by mouth daily.   Magnesium 250 MG TABS Take 1 tablet by mouth daily.   metoprolol succinate (TOPROL-XL) 25 MG 24 hr tablet Take 25 mg by mouth daily.   montelukast (SINGULAIR) 10 MG tablet Take 10 mg by mouth at bedtime.   omeprazole (PRILOSEC) 40 MG capsule Take 1 capsule (40 mg total) by mouth daily. Must keep appt on 12/06/16 with Dr. [id number] Use as instructed to check blood sugar 2-3 times a day.  DX E11.9   pioglitazone (ACTOS) 15 MG tablet Take 15 mg by mouth daily.   pravastatin (PRAVACHOL) 20 MG tablet TAKE 1 TABLET BY MOUTH ONCE DAILY (Patient taking differently: Take 20 mg by mouth daily.)   timolol (TIMOPTIC) 0.5 % ophthalmic solution Place 1 drop into both eyes 2 (two) times a day.   triamterene-hydrochlorothiazide (MAXZIDE-25) 37.5-25 MG tablet Take 1 tablet by mouth daily.   zinc gluconate 50 MG tablet Take 50 mg by mouth daily.   meloxicam (MOBIC) 7.5 MG tablet Take 1 tablet (7.5 mg total) by mouth daily as needed. (Patient not taking: Reported on 10/01/2021)   No facility-administered encounter medications on file as of 10/01/2021.    Allergies (verified) Penicillins, Sulfa antibiotics, Amlodipine, and Lisinopril   History: Past Medical History:  Diagnosis Date   Arthritis    Breast cancer ([id number]   Right breast, s/p mastectomy   Diabetes mellitus without complication (HCC)    GERD (gastroesophageal reflux disease)    Gout    Hyperlipidemia    Hypertension    Spinal stenosis    Past Surgical History:  Procedure Laterality Date   ABDOMINAL HYSTERECTOMY     BREAST BIOPSY Left    2016 benign    BREAST LUMPECTOMY     KNEE ARTHROSCOPY      MASTECTOMY     right side   TUBAL LIGATION     Family History  Problem Relation Age of Onset   Diabetes Mother    Alzheimer's disease Mother    Heart disease Father    Hyperlipidemia Father    Kidney disease Father    Cancer Brother        lung   Alzheimer's disease Brother    Heart disease Brother    Diabetes Sister    Diabetes Brother    Diabetes Brother    Social History   Socioeconomic History   Marital status: Married    Spouse name: JiLaverna Peace Number of children: 3   Years of education: 12   Highest education level: Not on file  Occupational History   Not on file  Tobacco Use   Smoking status: Former    Packs/day: 2.00  Years: 5.00    Pack years: 10.00    Types: Cigarettes   Smokeless tobacco: Never  Vaping Use   Vaping Use: Never used  Substance and Sexual Activity   Alcohol use: No   Drug use: No   Sexual activity: Not Currently  Other Topics Concern   Not on file  Social History Narrative   Marital Status:  Married Manufacturing systems engineer)    Children:  Daughter(1) Son (1)    Pets:  None    Living Situation: Lives with husband and daughter.     Occupation:  Retired Licensed conveyancer)    Education: 12th Grade    Tobacco Use/Exposure:  She used to smoke socially during the weekends but quit 40 years ago.    Alcohol Use:  Occasional   Drug Use:  None   Diet:  Regular   Exercise:  None   Hobbies:  Reading and playing volleyball             Social Determinants of Health   Financial Resource Strain: Low Risk    Difficulty of Paying Living Expenses: Not hard at all  Food Insecurity: No Food Insecurity   Worried About Charity fundraiser in the Last Year: Never true   Ran Out of Food in the Last Year: Never true  Transportation Needs: No Transportation Needs   Lack of Transportation (Medical): No   Lack of Transportation (Non-Medical): No  Physical Activity: Inactive   Days of Exercise per Week: 0 days   Minutes of Exercise per Session: 0 min  Stress: No Stress Concern  Present   Feeling of Stress : Not at all  Social Connections: Moderately Integrated   Frequency of Communication with Friends and Family: More than three times a week   Frequency of Social Gatherings with Friends and Family: More than three times a week   Attends Religious Services: More than 4 times per year   Active Member of [employer] or Organizations: No   Attends Music therapist: Never   Marital Status: Married    Tobacco Counseling Counseling given: Not Answered   Clinical Intake:  Pre-visit preparation completed: Yes  Pain : 0-10 Pain Score: 10-Worst pain ever Pain Type: Chronic pain Pain Location: Back (and hands) Pain Onset: More than a month ago Pain Frequency: Constant Pain Relieving Factors: Tylenol  Pain Relieving Factors: Tylenol  BMI - recorded: 37.13 Nutritional Status: BMI > 30  Obese Nutritional Risks: None Diabetes: Yes CBG done?: No Did pt. bring in CBG monitor from home?: No  How often do you need to have someone help you when you read instructions, pamphlets, or other written materials from your doctor or pharmacy?: 1 - Never  Diabetes:  Is the patient diabetic?  Yes  If diabetic, was a CBG obtained today?  No  Did the patient bring in their glucometer from home?  No phone visit How often do you monitor your CBG's? daily.   Financial Strains and Diabetes Management:  Are you having any financial strains with the device, your supplies or your medication? No .  Does the patient want to be seen by Chronic Care Management for management of their diabetes?  No  Would the patient like to be referred to a Nutritionist or for Diabetic Management?  No   Diabetic Exams:  Diabetic Eye Exam: Completed 07/2021-per patient.  Diabetic Foot Exam:Pt has been advised about the importance in completing this exam. To be completed by PCP.   Interpreter Needed?: No  Information  entered by :: Caroleen Hamman LPN   Activities of Daily Living In  your present state of health, do you have any difficulty performing the following activities: 10/01/2021 06/27/2021  Hearing? N N  Vision? N N  Difficulty concentrating or making decisions? N N  Walking or climbing stairs? N N  Dressing or bathing? N N  Doing errands, shopping? N N  Preparing Food and eating ? N -  Using the Toilet? N -  In the past six months, have you accidently leaked urine? Y -  Comment occasionally -  Do you have problems with loss of bowel control? N -  Managing your Medications? N -  Managing your Finances? N -  Housekeeping or managing your Housekeeping? N -  Some recent data might be hidden    Patient Care Team: Saguier, Iris Pert as PCP - General (Internal Medicine) Bo Merino, MD as Consulting Physician (Rheumatology) Rosemarie Ax, MD as Consulting Physician (Family Medicine)  Indicate any recent Medical Services you may have received from other than [facility] providers in the past year (date may be approximate).     Assessment:   This is a routine wellness examination for [place].  Hearing/Vision screen Hearing Screening - Comments:: No issues Vision Screening - Comments:: Reading glasses Last eye exam-07/2021-Dr. Jerline Pain &amp; Dr. Zadie Rhine  Dietary issues and exercise activities discussed: Current Exercise Habits: The patient does not participate in regular exercise at present   Goals Addressed             This Visit's Progress    Patient Stated       Maintain current health & independence       Depression Screen PHQ 2/9 Scores 10/01/2021 06/27/2021 03/28/2020 12/29/2016 12/29/2016 02/09/2014  PHQ - 2 Score 0 0 '1 1 1 ' 0    Fall Risk Fall Risk  10/01/2021 06/27/2021 12/29/2016 12/29/2016 02/09/2014  Falls in the past year? 1 0 Yes Yes No  Comment - - - Fell in her closet. -  Number falls in past yr: 0 0 1 1 -  Injury with Fall? 1 0 No No -  Risk for fall due to : History of fall(s) - - - -  Follow up Falls prevention discussed -  Education provided;Falls prevention discussed - -    FALL RISK PREVENTION PERTAINING TO THE HOME:  Any stairs in or around the home? Yes  If so, are there any without handrails? No  Home free of loose throw rugs in walkways, pet beds, electrical cords, etc? Yes  Adequate lighting in your home to reduce risk of falls? Yes   ASSISTIVE DEVICES UTILIZED TO PREVENT FALLS:  Life alert? No  Use of a cane, walker or w/c? Yes  Grab bars in the bathroom? No  Shower chair or bench in shower? Yes  Elevated toilet seat or a handicapped toilet? No   TIMED UP AND GO:  Was the test performed? No . Phone visit   Cognitive Function:Normal cognitive status assessed by  this Nurse Health Advisor. No abnormalities found.   MMSE - Mini Mental State Exam 12/29/2016  Orientation to time 5  Orientation to Place 5  Registration 3  Attention/ Calculation 5  Recall 3  Language- name 2 objects 2  Language- repeat 1  Language- follow 3 step command 3  Language- read & follow direction 1  Write a sentence 1  Copy design 0  Total score 29        Immunizations Immunization History  Administered  Date(s) Administered   Influenza,inj,Quad PF,6+ Mos 08/30/2016   Moderna Covid-19 Vaccine Bivalent Booster 16yr & up 08/29/2021   Moderna Sars-Covid-2 Vaccination 12/25/2019, 01/11/2020, 02/05/2020   Pneumococcal Conjugate-13 05/22/2015    TDAP status: Due, Education has been provided regarding the importance of this vaccine. Advised may receive this vaccine at local pharmacy or Health Dept. Aware to provide a copy of the vaccination record if obtained from local pharmacy or Health Dept. Verbalized acceptance and understanding.  Flu Vaccine status: Up to date-patient unsure of date  Pneumococcal vaccine status: Due, Education has been provided regarding the importance of this vaccine. Advised may receive this vaccine at local pharmacy or Health Dept. Aware to provide a copy of the vaccination record if  obtained from local pharmacy or Health Dept. Verbalized acceptance and understanding.  Covid-19 vaccine status: Completed vaccines  Qualifies for Shingles Vaccine? Yes   Zostavax completed No   Shingrix Completed?: No.    Education has been provided regarding the importance of this vaccine. Patient has been advised to call insurance company to determine out of pocket expense if they have not yet received this vaccine. Advised may also receive vaccine at local pharmacy or Health Dept. Verbalized acceptance and understanding.  Screening Tests Health Maintenance  Topic Date Due   TETANUS/TDAP  Never done   Zoster Vaccines- Shingrix (1 of 2) Never done   Pneumonia Vaccine 79 Years old (2 - PPSV23 if available, else PCV20) 05/21/2016   FOOT EXAM  02/12/2021   OPHTHALMOLOGY EXAM  05/24/2021   INFLUENZA VACCINE  06/30/2021   MAMMOGRAM  12/25/2021   HEMOGLOBIN A1C  12/28/2021   DEXA SCAN  Completed   COVID-19 Vaccine  Completed   Hepatitis C Screening  Completed   HPV VACCINES  Aged Out    Health Maintenance  Health Maintenance Due  Topic Date Due   TETANUS/TDAP  Never done   Zoster Vaccines- Shingrix (1 of 2) Never done   Pneumonia Vaccine 79 Years old (2 - PPSV23 if available, else PCV20) 05/21/2016   FOOT EXAM  02/12/2021   OPHTHALMOLOGY EXAM  05/24/2021   INFLUENZA VACCINE  06/30/2021    Colorectal cancer screening: No longer required.   Mammogram status: Completed unilateal-left 12/25/2020. Repeat every year  Bone Density status: Completed 07/31/2021. Results reflect: Bone density results: NORMAL. Repeat every 2 years.  Lung Cancer Screening: (Low Dose CT Chest recommended if Age 79-80years, 30 pack-year currently smoking OR have quit w/in 15years.) does not qualify.    Additional Screening:  Hepatitis C Screening: Completed 12/12/2014  Vision Screening: Recommended annual ophthalmology exams for early detection of glaucoma and other disorders of the eye. Is the patient  up to date with their annual eye exam?  Yes  Who is the provider or what is the name of the office in which the patient attends annual eye exams? Dr. PJerline Pain  Dental Screening: Recommended annual dental exams for proper oral hygiene  Community Resource Referral / Chronic Care Management: CRR required this visit?  No   CCM required this visit?  No      Plan:     I have personally reviewed and noted the following in the patient's chart:   Medical and social history Use of alcohol, tobacco or illicit drugs  Current medications and supplements including opioid prescriptions.  Functional ability and status Nutritional status Physical activity Advanced directives List of other physicians Hospitalizations, surgeries, and ER visits in previous 12 months Vitals Screenings to include cognitive, depression,  and falls Referrals and appointments  In addition, I have reviewed and discussed with patient certain preventive protocols, quality metrics, and best practice recommendations. A written personalized care plan for preventive services as well as general preventive health recommendations were provided to patient.   Due to this being a telephonic visit, the after visit summary with patients personalized plan was offered to patient via mail or my-chart.  Patient would like to access on my-chart.  Marta Antu, LPN   [id number]  Nurse Health Advisor  Nurse Notes: None

## 2021-10-06 ENCOUNTER — Encounter: Payer: Self-pay | Admitting: Medical

## 2021-10-06 ENCOUNTER — Other Ambulatory Visit: Payer: Self-pay

## 2021-10-06 ENCOUNTER — Ambulatory Visit (HOSPITAL_BASED_OUTPATIENT_CLINIC_OR_DEPARTMENT_OTHER)
Admission: RE | Admit: 2021-10-06 | Discharge: 2021-10-06 | Disposition: A | Discharge status: Home / Self Care | Payer: HMO | Source: Ambulatory Visit | Attending: Medical | Admitting: Medical

## 2021-10-06 ENCOUNTER — Ambulatory Visit (INDEPENDENT_AMBULATORY_CARE_PROVIDER_SITE_OTHER): Payer: HMO | Admitting: Medical

## 2021-10-06 VITALS — BP 110/60 | HR 71 | Temp 97.7°F | Resp 18 | Ht 62.0 in | Wt 208.0 lb

## 2021-10-06 DIAGNOSIS — M79672 Pain in left foot: Secondary | ICD-10-CM

## 2021-10-06 DIAGNOSIS — R600 Localized edema: Secondary | ICD-10-CM

## 2021-10-06 DIAGNOSIS — M79605 Pain in left leg: Secondary | ICD-10-CM | POA: Diagnosis not present

## 2021-10-06 DIAGNOSIS — Z23 Encounter for immunization: Secondary | ICD-10-CM

## 2021-10-06 DIAGNOSIS — M79662 Pain in left lower leg: Secondary | ICD-10-CM

## 2021-10-06 DIAGNOSIS — J811 Chronic pulmonary edema: Secondary | ICD-10-CM | POA: Diagnosis not present

## 2021-10-06 DIAGNOSIS — I10 Essential (primary) hypertension: Secondary | ICD-10-CM | POA: Diagnosis not present

## 2021-10-06 DIAGNOSIS — M79604 Pain in right leg: Secondary | ICD-10-CM | POA: Diagnosis not present

## 2021-10-06 DIAGNOSIS — M7989 Other specified soft tissue disorders: Secondary | ICD-10-CM | POA: Diagnosis not present

## 2021-10-06 DIAGNOSIS — E1142 Type 2 diabetes mellitus with diabetic polyneuropathy: Secondary | ICD-10-CM

## 2021-10-06 DIAGNOSIS — N1831 Chronic kidney disease, stage 3a: Secondary | ICD-10-CM | POA: Diagnosis not present

## 2021-10-06 NOTE — Patient Instructions (Signed)
Bilateral lower extremity pedal edema with left popliteal area fullness and left calf pain.  We will get bilateral lower extremity ultrasounds to make sure no DVT.  Also get chest x-ray with BNP labs to rule out any CHF.  If ultrasound study is negative and no CHF then will advise to wear your compression stockings and elevate legs daily.  For diabetes follow-up with Dr. Kelton Pillar endocrinologist.  For chronic kidney disease we will check your metabolic panel.  Hypertension well controlled.  Blood pressure is on the lower end.  We will continue your losartan, Maxide and beta-blocker.  If he had dizziness on standing please let us know as blood pressure tightly controlled.  Left heel pain.  We will get foot x-rays to see if you have heel spur.  For low back pain and spinal stenosis might need to increase your gabapentin to max dosing.  If I do that in the future would consult with pharmacist.  Follow-up date to be determined after lab and chest x-ray review.

## 2021-10-06 NOTE — Progress Notes (Signed)
Subjective:    Patient ID: Donna Fuller, female    DOB: 03-Aug-1941, 80 y.o.   MRN: [id number]  HPI  Pt in for follow up.  Pt has left ankle swelling. Pt states her ankle swollen for about 4 months. It swells off and on. More on left side but some on rt side as well.    Pt has diabetes. Pt states recent tried to get a1c and blood draw failed. Pt states her sugars have been controlled in past. Pt is on glipizide, trulicity and actos. Pt is seeing endocrinogist Dr. Kelton Pillar sometimes this month.   Htn history. She is on toprol xl 25 mg dailyand  losatan 70m  Pt wants to be off losartan with hctz. Pt on diuretic from cardiologist. I can't see [facility] record. She is on maxzide.   Pt states did not get any benefit from sports medicine referral. Pt is going to see rheumatologist in December.  Pt had covid vaccine about 2 weeks ago. Got moderate size bruise that was about 4 inches x 4 inches. It was itching little bit. But no wide spread rash.   Spinal stenosis. Low back pain. Neuropathy type pain bilateral. Pt state hydrocodone did not help.  gabapentin did not help. 300 mg tid for past 5 years.    Review of Systems  Constitutional:  Negative for chills, fatigue and fever.  Respiratory:  Negative for cough, chest tightness, shortness of breath and wheezing.   Cardiovascular:  Negative for chest pain and palpitations.  Gastrointestinal:  Negative for abdominal pain and nausea.  Genitourinary:  Negative for difficulty urinating, dysuria, enuresis and flank pain.  Musculoskeletal:  Negative for back pain, myalgias and neck stiffness.  Skin:  Negative for rash.    Past Medical History:  Diagnosis Date   Arthritis    Breast cancer ([place] 2007   Right breast, s/p mastectomy   Diabetes mellitus without complication (HCC)    GERD (gastroesophageal reflux disease)    Gout    Hyperlipidemia    Hypertension    Spinal stenosis      Social History   Socioeconomic History    Marital status: Married    Spouse name: Jimmy   Number of children: 3   Years of education: 12   Highest education level: Not on file  Occupational History   Not on file  Tobacco Use   Smoking status: Former    Packs/day: 2.00    Years: 5.00    Pack years: 10.00    Types: Cigarettes   Smokeless tobacco: Never  Vaping Use   Vaping Use: Never used  Substance and Sexual Activity   Alcohol use: No   Drug use: No   Sexual activity: Not Currently  Other Topics Concern   Not on file  Social History Narrative   Marital Status:  Married (Manufacturing systems engineer    Children:  Daughter(1) Son (1)    Pets:  None    Living Situation: Lives with husband and daughter.     Occupation:  Retired (Licensed conveyancer    Education: 12th Grade    Tobacco Use/Exposure:  She used to smoke socially during the weekends but quit 40 years ago.    Alcohol Use:  Occasional   Drug Use:  None   Diet:  Regular   Exercise:  None   Hobbies:  Reading and playing volleyball             Social Determinants of Health   Financial Resource Strain: Low Risk  Difficulty of Paying Living Expenses: Not hard at all  Food Insecurity: No Food Insecurity   Worried About [place] in the Last Year: Never true   Ran Out of Food in the Last Year: Never true  Transportation Needs: No Transportation Needs   Lack of Transportation (Medical): No   Lack of Transportation (Non-Medical): No  Physical Activity: Inactive   Days of Exercise per Week: 0 days   Minutes of Exercise per Session: 0 min  Stress: No Stress Concern Present   Feeling of Stress : Not at all  Social Connections: Moderately Integrated   Frequency of Communication with Friends and Family: More than three times a week   Frequency of Social Gatherings with Friends and Family: More than three times a week   Attends Religious Services: More than 4 times per year   Active Member of [employer] or Organizations: No   Attends Music therapist: Never   Marital  Status: Married  Human resources officer Violence: Not At Risk   Fear of Current or Ex-Partner: No   Emotionally Abused: No   Physically Abused: No   Sexually Abused: No    Past Surgical History:  Procedure Laterality Date   ABDOMINAL HYSTERECTOMY     BREAST BIOPSY Left    2016 benign    BREAST LUMPECTOMY     KNEE ARTHROSCOPY     MASTECTOMY     right side   TUBAL LIGATION      Family History  Problem Relation Age of Onset   Diabetes Mother    Alzheimer's disease Mother    Heart disease Father    Hyperlipidemia Father    Kidney disease Father    Cancer Brother        lung   Alzheimer's disease Brother    Heart disease Brother    Diabetes Sister    Diabetes Brother    Diabetes Brother     Allergies  Allergen Reactions   Penicillins Swelling   Sulfa Antibiotics Swelling   Amlodipine Swelling    LE Swelling   Lisinopril Cough    Current Outpatient Medications on File Prior to Visit  Medication Sig Dispense Refill   acetaminophen (TYLENOL) 650 MG CR tablet Take 650 mg by mouth daily.     ascorbic acid (VITAMIN C) 500 MG tablet Take 500 mg by mouth daily.     aspirin 81 MG tablet Take 81 mg by mouth daily.     baclofen (LIORESAL) 10 MG tablet Take 10 mg by mouth 3 (three) times daily as needed.     Biotin 1000 MCG CHEW Chew by mouth.     blood glucose meter kit and supplies KIT Dispense based on patient and insurance preference. Use up to four times daily as directed. (FOR ICD-9 250.00, 250.01). 1 each 11   brimonidine (ALPHAGAN) 0.2 % ophthalmic solution Place 1 drop into both eyes 2 (two) times a day.     Cholecalciferol (VITAMIN D-3) 5000 units TABS Take 1 tablet by mouth daily.     COVID-19 mRNA bivalent vaccine, Moderna, (MODERNA COVID-19 BIVAL BOOSTER) 50 MCG/0.5ML injection Inject into the muscle. 0.5 mL 0   Dulaglutide (TRULICITY) 3 FX/9.0WI SOPN Inject 0.5 mLs (3 mg total) as directed once a week. 12 pen 3   ferrous sulfate 325 (65 FE) MG tablet Take 325 mg by  mouth daily with breakfast.     furosemide (LASIX) 20 MG tablet Take 20 mg by mouth daily.  gabapentin (NEURONTIN) 300 MG capsule TAKE 1 CAPSULE BY MOUTH THREE TIMES DAILY ,  MAY  TAKE  AN  ADDITIONAL  CAPSULE  IN  THE  EVENING (Patient taking differently: Take 300 mg by mouth See admin instructions. Take 333m three times daily, may take an additional 3065min the evening.) 100 capsule 0   glipiZIDE (GLUCOTROL) 5 MG tablet TAKE 1 AND 1/2 TABLETS(7.5 MG) BY MOUTH DAILY BEFORE BREAKFAST 135 tablet 0   glucose blood (ONE TOUCH ULTRA TEST) test strip USE AS DIRECTED TO CHECK BLOOD SUGAR 2-3 TIMES A DAY Pt will need an OV for more refills 100 each 0   ketorolac (ACULAR) 0.5 % ophthalmic solution INSTILL 1 DROP IN RIGHT EYE FOUR TIMES DAILY 5 mL 6   latanoprost (XALATAN) 0.005 % ophthalmic solution Place 1 drop into both eyes daily.     losartan (COZAAR) 50 MG tablet Take 50 mg by mouth daily.     Magnesium 250 MG TABS Take 1 tablet by mouth daily.     meloxicam (MOBIC) 7.5 MG tablet Take 1 tablet (7.5 mg total) by mouth daily as needed. 45 tablet 1   metoprolol succinate (TOPROL-XL) 25 MG 24 hr tablet Take 25 mg by mouth daily.     montelukast (SINGULAIR) 10 MG tablet Take 10 mg by mouth at bedtime.     omeprazole (PRILOSEC) 40 MG capsule Take 1 capsule (40 mg total) by mouth daily. Must keep appt on 12/06/16 with Dr. SoCaryl Bis0 capsule 0   [facility] LANCETS 3340JISC Use as instructed to check blood sugar 2-3 times a day.  DX E11.9 100 each 3   pioglitazone (ACTOS) 15 MG tablet Take 15 mg by mouth daily.     pravastatin (PRAVACHOL) 20 MG tablet TAKE 1 TABLET BY MOUTH ONCE DAILY (Patient taking differently: Take 20 mg by mouth daily.) 90 tablet 0   timolol (TIMOPTIC) 0.5 % ophthalmic solution Place 1 drop into both eyes 2 (two) times a day.     triamterene-hydrochlorothiazide (MAXZIDE-25) 37.5-25 MG tablet Take 1 tablet by mouth daily.     zinc gluconate 50 MG tablet Take 50 mg by mouth daily.      No current facility-administered medications on file prior to visit.    BP 110/60   Pulse 71   Temp 97.7 F (36.5 C)   Resp 18   Ht '5\' 2"'  (1.575 m)   Wt 208 lb (94.3 kg)   SpO2 99%   BMI 38.04 kg/m       Objective:   Physical Exam  General Mental Status- Alert. General Appearance- Not in acute distress.   Skin General: Color- Normal Color. Moisture- Normal Moisture.  Neck Carotid Arteries- Normal color. Moisture- Normal Moisture. No carotid bruits. No JVD.  Chest and Lung Exam Auscultation: Breath Sounds:-Normal.  Cardiovascular Auscultation:Rythm- Regular. Murmurs & Other Heart Sounds:Auscultation of the heart reveals- No Murmurs.  Abdomen Inspection:-Inspeection Normal. Palpation/Percussion:Note:No mass. Palpation and Percussion of the abdomen reveal- Non Tender, Non Distended + BS, no rebound or guarding.   Neurologic Cranial Nerve exam:- CN III-XII intact(No nystagmus), symmetric smile. Strength:- 5/5 equal and symmetric strength both upper and lower extremities.   Lower ext- 1+ pedal edema up to middle of anterior tibial area. Fullness in left popliteal fossa area.       Assessment & Plan:   Patient Instructions  Bilateral lower extremity pedal edema with left popliteal area fullness and left calf pain.  We will get bilateral lower extremity ultrasounds  to make sure no DVT.  Also get chest x-ray with BNP labs to rule out any CHF.  If ultrasound study is negative and no CHF then will advise to wear your compression stockings and elevate legs daily.  For diabetes follow-up with Dr. Kelton Pillar endocrinologist.  For chronic kidney disease we will check your metabolic panel.  Hypertension well controlled.  Blood pressure is on the lower end.  We will continue your losartan, Maxide and beta-blocker.  If he had dizziness on standing please let us know as blood pressure tightly controlled.  Left heel pain.  We will get foot x-rays to see if you have  heel spur.  For low back pain and spinal stenosis might need to increase your gabapentin to max dosing.  If I do that in the future would consult with pharmacist.  Follow-up date to be determined after lab and chest x-ray review.   Time spent with patient today was  20mnutes which consisted of chart revdiew, discussing diagnosis, work up treatment and documentation.

## 2021-10-06 NOTE — Addendum Note (Signed)
Addended by: Kem Boroughs D on: 10/06/2021 02:25 PM   Modules accepted: Orders

## 2021-10-06 NOTE — Addendum Note (Signed)
Addended by: Jeronimo Greaves on: 10/06/2021 02:27 PM   Modules accepted: Orders

## 2021-10-07 ENCOUNTER — Other Ambulatory Visit: Payer: Self-pay | Admitting: *Deleted

## 2021-10-07 ENCOUNTER — Telehealth: Payer: Self-pay | Admitting: *Deleted

## 2021-10-07 DIAGNOSIS — R600 Localized edema: Secondary | ICD-10-CM

## 2021-10-07 LAB — COMPREHENSIVE METABOLIC PANEL
ALT: 9 U/L (ref 0–35)
AST: 14 U/L (ref 0–37)
Albumin: 4.1 g/dL (ref 3.5–5.2)
Alkaline Phosphatase: 99 U/L (ref 39–117)
BUN: 26 mg/dL — ABNORMAL HIGH (ref 6–23)
CO2: 28 mEq/L (ref 19–32)
Calcium: 9.4 mg/dL (ref 8.4–10.5)
Chloride: 103 mEq/L (ref 96–112)
Creatinine, Ser: 1.5 mg/dL — ABNORMAL HIGH (ref 0.40–1.20)
GFR: 32.87 mL/min — ABNORMAL LOW (ref >60.00)
Glucose, Bld: 101 mg/dL — ABNORMAL HIGH (ref 70–99)
Potassium: 4.4 mEq/L (ref 3.5–5.1)
Sodium: 139 mEq/L (ref 135–145)
Total Bilirubin: 0.4 mg/dL (ref 0.2–1.2)
Total Protein: 6.7 g/dL (ref 6.0–8.3)

## 2021-10-07 NOTE — Telephone Encounter (Signed)
FYI Patient blood for bnp was not sent frozen to lab.    Called patient and she will be back in tomorrow for redraw.

## 2021-10-08 ENCOUNTER — Other Ambulatory Visit: Payer: Self-pay

## 2021-10-08 ENCOUNTER — Other Ambulatory Visit (INDEPENDENT_AMBULATORY_CARE_PROVIDER_SITE_OTHER): Payer: HMO

## 2021-10-08 DIAGNOSIS — R600 Localized edema: Secondary | ICD-10-CM

## 2021-10-08 LAB — BRAIN NATRIURETIC PEPTIDE: Pro B Natriuretic peptide (BNP): 193 pg/mL — ABNORMAL HIGH (ref 0.0–100.0)

## 2021-10-08 NOTE — Progress Notes (Signed)
Pt here for redraw, no charge today  

## 2021-10-16 ENCOUNTER — Other Ambulatory Visit: Payer: Self-pay

## 2021-10-16 ENCOUNTER — Ambulatory Visit: Payer: HMO | Admitting: Internal Medicine

## 2021-10-16 ENCOUNTER — Encounter: Payer: Self-pay | Admitting: Internal Medicine

## 2021-10-16 VITALS — BP 124/78 | HR 88 | Ht 62.0 in | Wt 212.0 lb

## 2021-10-16 DIAGNOSIS — E1142 Type 2 diabetes mellitus with diabetic polyneuropathy: Secondary | ICD-10-CM | POA: Diagnosis not present

## 2021-10-16 DIAGNOSIS — E1165 Type 2 diabetes mellitus with hyperglycemia: Secondary | ICD-10-CM

## 2021-10-16 LAB — POCT GLYCOSYLATED HEMOGLOBIN (HGB A1C): Hemoglobin A1C: 6 % — AB (ref 4.0–5.6)

## 2021-10-16 MED ORDER — PIOGLITAZONE HCL 15 MG PO TABS: 15.0000 mg | ORAL_TABLET | Freq: Every day | ORAL | 3 refills | Status: DC

## 2021-10-16 MED ORDER — GLIPIZIDE 5 MG PO TABS: ORAL_TABLET | ORAL | 3 refills | Status: DC

## 2021-10-16 MED ORDER — TRULICITY 3 MG/0.5ML ~~LOC~~ SOAJ
3.0000 mg | SUBCUTANEOUS | 3 refills | Status: DC
Start: 2021-10-16 — End: 2023-01-22

## 2021-10-16 NOTE — Patient Instructions (Signed)
-   Continue trulicity 3 mg weekly  - Continue Glipizide 5 mg , 1 tablet before Breakfast, and half a tablet before supper - Continue Actos 15 mg, 1 tablet with Breakfast      - HOW TO TREAT LOW BLOOD SUGARS (Blood sugar LESS THAN 70 MG/DL) Please follow the RULE OF 15 for the treatment of hypoglycemia treatment (when your (blood sugars are less than 70 mg/dL)   STEP 1: Take 15 grams of carbohydrates when your blood sugar is low, which includes:  3-4 GLUCOSE TABS  OR 3-4 OZ OF JUICE OR REGULAR SODA OR ONE TUBE OF GLUCOSE GEL    STEP 2: RECHECK blood sugar in 15 MINUTES STEP 3: If your blood sugar is still low at the 15 minute recheck --> then, go back to STEP 1 and treat AGAIN with another 15 grams of carbohydrates.

## 2021-10-16 NOTE — Progress Notes (Signed)
Name: Donna Fuller  Age/ Sex: 80 y.o., female   MRN/ DOB: [id number], 08-28-1941     PCP: Elise Benne   Reason for Endocrinology Evaluation: Type 2 Diabetes Mellitus  Initial Endocrine Consultative Visit: 12/14/2019    PATIENT IDENTIFIER: [name] is a 80 y.o. female with a past medical history of T2DM, HTN and Dyslipidemia. The patient has followed with Endocrinology clinic since 12/14/2019 for consultative assistance with management of her diabetes.  DIABETIC HISTORY:  Ms. Cathy was diagnosed with T2DM many years ago. She was on metformin due to low renal function, has been on Januvia, Farxia and insulin as well. Her hemoglobin A1c has ranged from 6.0 %in 2015, peaking at 8.8% in 2020.  On her initial visit to our clinic her A1c was 8.8%   , she was on Trulicity and pioglitazone, we added glipizide  SUBJECTIVE:   During the last visit (04/14/2021): A1c 6.6%, We continued  Trulicity,Pioglitazone, and glipizide  Today (10/16/2021): [name] is here for follow-up on diabetes management. She checks her blood sugars 2 times daily, preprandial to breakfast and dinnertime.  The patient has not had hypoglycemic episodes since the last clinic visit.   Has occasional nausea but no vomiting or diarrhea , has heartburn   Has been tired and has muscle aches -PT did not help     HOME DIABETES REGIMEN:  Glipizide 5 mg , 1 tablet before Breast and half before supper  Trulicity 3 mg weekly Pioglitazone 15 mg daily    METER DOWNLOAD SUMMARY: Date range evaluated: 10/19-11/17/2022 Average Number Tests/Day = 0.9 Overall Mean FS Glucose = 180   BG Ranges: Low = 120 High = 309   Hypoglycemic Events/30 Days: BG < 50 = 0 Episodes of symptomatic severe hypoglycemia = 0   DIABETIC COMPLICATIONS: Microvascular complications:  Neuropathy  Denies: retinopathy , CKD Last eye exam: Completed 02/2020   Macrovascular complications:    Denies: CAD, PVD,  CVA     HISTORY:  Past Medical History:  Past Medical History:  Diagnosis Date   Arthritis    Breast cancer ([place]) 2007   Right breast, s/p mastectomy   Diabetes mellitus without complication ([place])    GERD (gastroesophageal reflux disease)    Gout    Hyperlipidemia    Hypertension    Spinal stenosis    Past Surgical History:  Past Surgical History:  Procedure Laterality Date   ABDOMINAL HYSTERECTOMY     BREAST BIOPSY Left    2016 benign    BREAST LUMPECTOMY     KNEE ARTHROSCOPY     MASTECTOMY     right side   TUBAL LIGATION     Social History:  reports that she has quit smoking. Her smoking use included cigarettes. She has a 10.00 pack-year smoking history. She has never used smokeless tobacco. She reports that she does not drink alcohol and does not use drugs. Family History:  Family History  Problem Relation Age of Onset   Diabetes Mother    Alzheimer's disease Mother    Heart disease Father    Hyperlipidemia Father    Kidney disease Father    Cancer Brother        lung   Alzheimer's disease Brother    Heart disease Brother    Diabetes Sister    Diabetes Brother    Diabetes Brother      HOME MEDICATIONS: Allergies as of 10/16/2021       Reactions   Penicillins  Swelling   Sulfa Antibiotics Swelling   Amlodipine Swelling   LE Swelling   Lisinopril Cough        Medication List        Accurate as of October 16, 2021 10:40 AM. If you have any questions, ask your nurse or doctor.          acetaminophen 650 MG CR tablet Commonly known as: TYLENOL Take 650 mg by mouth daily.   ascorbic acid 500 MG tablet Commonly known as: VITAMIN C Take 500 mg by mouth daily.   aspirin 81 MG tablet Take 81 mg by mouth daily.   baclofen 10 MG tablet Commonly known as: LIORESAL Take 10 mg by mouth 3 (three) times daily as needed.   Biotin 1000 MCG Chew Chew by mouth.   blood glucose meter kit and supplies Kit Dispense based on patient and insurance  preference. Use up to four times daily as directed. (FOR ICD-9 250.00, 250.01).   brimonidine 0.2 % ophthalmic solution Commonly known as: ALPHAGAN Place 1 drop into both eyes 2 (two) times a day.   ferrous sulfate 325 (65 FE) MG tablet Take 325 mg by mouth daily with breakfast.   furosemide 20 MG tablet Commonly known as: LASIX Take 20 mg by mouth daily.   gabapentin 300 MG capsule Commonly known as: NEURONTIN TAKE 1 CAPSULE BY MOUTH THREE TIMES DAILY ,  MAY  TAKE  AN  ADDITIONAL  CAPSULE  IN  THE  EVENING What changed: See the new instructions.   glipiZIDE 5 MG tablet Commonly known as: GLUCOTROL TAKE 1 AND 1/2 TABLETS(7.5 MG) BY MOUTH DAILY BEFORE BREAKFAST   glucose blood test strip Commonly known as: ONE TOUCH ULTRA TEST USE AS DIRECTED TO CHECK BLOOD SUGAR 2-3 TIMES A DAY Pt will need an OV for more refills   ketorolac 0.5 % ophthalmic solution Commonly known as: ACULAR INSTILL 1 DROP IN RIGHT EYE FOUR TIMES DAILY   latanoprost 0.005 % ophthalmic solution Commonly known as: XALATAN Place 1 drop into both eyes daily.   losartan 50 MG tablet Commonly known as: COZAAR Take 50 mg by mouth daily.   Magnesium 250 MG Tabs Take 1 tablet by mouth daily.   meloxicam 7.5 MG tablet Commonly known as: Mobic Take 1 tablet (7.5 mg total) by mouth daily as needed.   metoprolol succinate 25 MG 24 hr tablet Commonly known as: TOPROL-XL Take 25 mg by mouth daily.   Moderna COVID-19 [place] Booster 50 MCG/0.5ML injection Generic drug: COVID-19 mRNA bivalent vaccine (Moderna) Inject into the muscle.   montelukast 10 MG tablet Commonly known as: SINGULAIR Take 10 mg by mouth at bedtime.   omeprazole 40 MG capsule Commonly known as: PRILOSEC Take 1 capsule (40 mg total) by mouth daily. Must keep appt on 12/06/16 with Dr. Ricky Ala Lancets 44W Misc Use as instructed to check blood sugar 2-3 times a day.  DX E11.9   pioglitazone 15 MG tablet Commonly known  as: ACTOS Take 15 mg by mouth daily.   pravastatin 20 MG tablet Commonly known as: PRAVACHOL TAKE 1 TABLET BY MOUTH ONCE DAILY   timolol 0.5 % ophthalmic solution Commonly known as: TIMOPTIC Place 1 drop into both eyes 2 (two) times a day.   triamterene-hydrochlorothiazide 37.5-25 MG tablet Commonly known as: MAXZIDE-25 Take 1 tablet by mouth daily.   Trulicity 3 NU/2.7OZ Sopn Generic drug: Dulaglutide Inject 0.5 mLs (3 mg total) as directed once a week.   Vitamin  D-3 125 MCG (5000 UT) Tabs Take 1 tablet by mouth daily.   zinc gluconate 50 MG tablet Take 50 mg by mouth daily.         OBJECTIVE:   Vital Signs: BP 124/78 (BP Location: Left Arm, Patient Position: Sitting, Cuff Size: Large)   Pulse 88   Ht _0  (1.575 m)   Wt 212 lb (96.2 kg)   SpO2 94%   BMI 38.78 kg/m   Wt Readings from Last 3 Encounters:  10/16/21 212 lb (96.2 kg)  10/06/21 208 lb (94.3 kg)  10/01/21 203 lb (92.1 kg)     Exam: General: Pt appears well and is in NAD  Lungs: Clear with good BS bilat with no rales, rhonchi, or wheezes  Heart: RRR with normal S1 and S2 and no gallops; no murmurs; no rub  Extremities: Trace  pretibial edema.  Neuro: MS is good with appropriate affect, pt is alert and Ox3    DM foot exam:   04/14/2021  The skin of the feet is intact without sores or ulcerations. The pedal pulses are 1+ on right and 1+ on left. The sensation is intact to a screening 5.07, 10 gram monofilament bilaterally    DATA REVIEWED:  Lab Results  Component Value Date   HGBA1C 6.0 (A) 10/16/2021   HGBA1C 6.2 (H) 06/27/2021   HGBA1C 7.9 (H) 06/01/2019   Lab Results  Component Value Date   MICROALBUR 0.4 04/21/2017   LDLCALC 77 05/31/2019   CREATININE 1.50 (H) 10/06/2021     Latest Reference Range & Units 10/06/21 14:25  Sodium 135 - 145 mEq/L 139  Potassium 3.5 - 5.1 mEq/L 4.4  Chloride 96 - 112 mEq/L 103  CO2 19 - 32 mEq/L 28  Glucose 70 - 99 mg/dL 101 (H)  BUN 6 - 23  mg/dL 26 (H)  Creatinine 0.40 - 1.20 mg/dL 1.50 (H)  Calcium 8.4 - 10.5 mg/dL 9.4  Alkaline Phosphatase 39 - 117 U/L 99  Albumin 3.5 - 5.2 g/dL 4.1  AST 0 - 37 U/L 14  ALT 0 - 35 U/L 9  Total Protein 6.0 - 8.3 g/dL 6.7  Total Bilirubin 0.2 - 1.2 mg/dL 0.4  GFR >60.00 mL/min 32.87 (L)      ASSESSMENT / PLAN / RECOMMENDATIONS:   1) Type 2 Diabetes Mellitus, optimally controlled, With neuropathic and CKD III complications - Most recent A1c of 6.6 %. Goal A1c <7.0%.   -A1c is low at 6.0 % this is skewed due to CKD  - Her Bg's were trending up to 200-300 mg/dL but she attributes this to OTC medication that she show advertised on TV . She had stopped it and this has resolved hyperglycemia  - Will monitor trace pretibial edema, if worsens will stop Pioglitazone    MEDICATIONS: Continue glipizide 5 mg, 1 tablet before Breakfast and half a tablet before supper Continue pioglitazone 15 mg, 1 tablet daily Continue Trulicity 3 mg weekly  EDUCATION / INSTRUCTIONS: BG monitoring instructions: Patient is instructed to check her blood sugars 2 times a day, fasting and suppertime. Call [facility] if: BG persistently < 70  I reviewed the Rule of 15 for the treatment of hypoglycemia in detail with the patient. Literature supplied.    2) Diabetic complications:  Eye: Does not have known diabetic retinopathy.  Neuro/ Feet: Does  have known diabetic peripheral neuropathy. Renal: Patient does not have known baseline CKD. She is  on an ACEI/ARB at present.  F/U in  6 months   Signed electronically by: Mack Guise, MD  [facility] Endocrinology  [facility] [address] [place], [place] [address] Phone: [phone] FAX: [phone]   CC: Elise Benne [address] [place] [address] Phone: [phone]  Fax: [phone]  Return to Endocrinology clinic as below: Future Appointments  Date Time Provider  [place]  11/18/2021  9:45 AM Bo Merino, MD [facility] None  12/16/2021  2:00 PM Bo Merino, MD [facility] None  02/12/2022 10:45 AM Rankin, Clent Demark, MD [facility] None  10/05/2022  1:00 PM [facility] COACH [facility]

## 2021-10-21 ENCOUNTER — Telehealth: Payer: Self-pay | Admitting: Internal Medicine

## 2021-10-21 DIAGNOSIS — E1165 Type 2 diabetes mellitus with hyperglycemia: Secondary | ICD-10-CM

## 2021-10-21 MED ORDER — ONETOUCH DELICA LANCETS 33G MISC: 3 refills | Status: AC

## 2021-10-21 MED ORDER — GLUCOSE BLOOD VI STRP: ORAL_STRIP | 0 refills | Status: DC

## 2021-10-21 NOTE — Addendum Note (Signed)
Addended by: Sarina Ill on: 10/21/2021 10:36 AM   Modules accepted: Orders

## 2021-10-21 NOTE — Telephone Encounter (Signed)
Patient is calling for a refill on Onetouch Ultra Test (strips) and Onetouch Delica Lancets ONETOUCH DELICA LANCETS 88I MISC glucose blood (ONE TOUCH ULTRA TEST) test strip [id number] [address], [place] AT [facility] Phone:  [phone]  Fax:  [phone]

## 2021-10-30 ENCOUNTER — Ambulatory Visit: Payer: HMO | Admitting: Rheumatology

## 2021-11-01 NOTE — Progress Notes (Signed)
Office Visit Note  Patient: Donna Fuller             Date of Birth: 09/30/1941           MRN: [id number]             PCP: Mackie Pai, PA-C Referring: Rosemarie Ax, MD Visit Date: 11/03/2021 Occupation: '@GUAROCC' @  Subjective:  Pain in multiple joints.   History of Present Illness: Donna Fuller is a 80 y.o. female seen in consultation per request of Dr. Raeford Razor.  According to the patient her symptoms of joint pain started many years ago.  She was diagnosed with degenerative disease of lumbar spine with a spinal stenosis in 2005.  She was advised surgery.  She also had a scoliosis.  In 2007 she was diagnosed with breast cancer and underwent right mastectomy.  She was advised not to have lumbar spine surgery eventually.  She states she continues to live with chronic lower back pain.  She also has longstanding history of bilateral knee joint pain.  In 2013 she underwent bilateral total knee replacement by Dr. Laurance Flatten in [place].  Despite having bilateral total knee replacement she has ongoing pain in her knee joints.  In 2014 she started experiencing pain and discomfort in her bilateral hands.  At the time she was told that she had carpal tunnel syndrome.  She has ongoing symptoms of carpal tunnel syndrome.  She states her hand pain has been worse in the last 3 years.  She is also noticed some hand swelling.  She was evaluated by Dr. Raeford Razor in August 2022 who did some labs and her rheumatoid factor was positive.  For that reason she was referred to me.  She describes pain and discomfort in her cervical spine, thoracic spine, lumbar spine, shoulders, elbows, wrists, hands, left hip, bilateral knee joints and her ankles.  She notices swelling in her hands and her bilateral ankles.  None of the other joints are swollen.  There is positive history of gout in her father.  Gravida 3, para 3, miscarriages 0.  There is no history of DVTs.  Activities of Daily Living:  Patient reports morning  stiffness for all day. Patient Reports nocturnal pain.  Difficulty dressing/grooming: Reports Difficulty climbing stairs: Reports Difficulty getting out of chair: Reports Difficulty using hands for taps, buttons, cutlery, and/or writing: Reports  Review of Systems  Constitutional:  Positive for fatigue. Negative for night sweats, weight gain and weight loss.  HENT:  Positive for mouth dryness and nose dryness. Negative for mouth sores, trouble swallowing and trouble swallowing.   Eyes:  Positive for itching. Negative for pain, redness, visual disturbance and dryness.  Respiratory:  Positive for shortness of breath. Negative for cough and difficulty breathing.   Cardiovascular:  Negative for chest pain, palpitations, hypertension, irregular heartbeat and swelling in legs/feet.  Gastrointestinal:  Positive for constipation. Negative for blood in stool and diarrhea.  Endocrine: Negative for increased urination.  Genitourinary:  Positive for difficulty urinating. Negative for vaginal dryness.  Musculoskeletal:  Positive for joint pain, joint pain, joint swelling, myalgias, morning stiffness, muscle tenderness and myalgias. Negative for muscle weakness.  Skin:  Negative for color change, rash, hair loss, redness, skin tightness, ulcers and sensitivity to sunlight.  Allergic/Immunologic: Positive for susceptible to infections.  Neurological:  Positive for dizziness, numbness, headaches and weakness. Negative for memory loss and night sweats.  Hematological:  Positive for bruising/bleeding tendency. Negative for swollen glands.  Psychiatric/Behavioral:  Positive for sleep  disturbance. Negative for depressed mood. The patient is not nervous/anxious.    PMFS History:  Patient Active Problem List   Diagnosis Date Noted   DDD (degenerative disc disease), lumbar 11/03/2021   Rheumatoid factor positive 07/29/2021   History of total bilateral knee replacement 07/29/2021   Other idiopathic scoliosis,  thoracic region 07/08/2021   Estrogen deficiency 07/08/2021   Arthralgia of both hands 07/08/2021   Moderate nonproliferative diabetic retinopathy of left eye ([place]) 02/11/2021   Pseudophakia 02/11/2021   Primary open angle glaucoma of both eyes, moderate stage 02/11/2021   Cystoid macular edema, right eye 02/11/2021   Type 2 diabetes mellitus with hyperglycemia, without long-term current use of insulin ([place]) 12/14/2019   Type 2 diabetes mellitus with diabetic polyneuropathy, without long-term current use of insulin ([place]) 12/14/2019   Pleuritic chest pain 05/30/2019   Fever 05/30/2019   Yeast vaginitis 07/19/2017   Urinary frequency 07/19/2017   CKD (chronic kidney disease) stage 3, GFR 30-59 ml/min ([place]) 05/20/2017   Gout 05/20/2017   Hyperlipidemia 09/26/2016   Spinal stenosis 09/26/2016   History of breast cancer 09/26/2016   Diabetes mellitus type 2, controlled ([place]) 09/26/2016   GERD (gastroesophageal reflux disease) 09/26/2016   Facet arthropathy, lumbosacral 03/13/2015   Neuropathy 03/13/2015   Hypertension    Lumbar radiculopathy 01/08/2014    Past Medical History:  Diagnosis Date   Arthritis    Breast cancer ([place]) 2007   Right breast, s/p mastectomy   Diabetes mellitus without complication (HCC)    GERD (gastroesophageal reflux disease)    Gout    Hyperlipidemia    Hypertension    Spinal stenosis     Family History  Problem Relation Age of Onset   Diabetes Mother    Alzheimer's disease Mother    Hypertension Mother    Hypertension Father    Heart disease Father    Hyperlipidemia Father    Kidney disease Father    Diabetes Sister    Cancer Brother        lung   Alzheimer's disease Brother    Heart disease Brother    Hypertension Brother    Diabetes Brother    Hypertension Brother    Diabetes Brother    Hyperlipidemia Son    Hypertension Son    Diabetes Son    Hypertension Daughter    Hyperlipidemia Daughter    Past Surgical History:  Procedure  Laterality Date   ABDOMINAL HYSTERECTOMY     BREAST BIOPSY Left    2016 benign    MASTECTOMY     right side   REPLACEMENT TOTAL KNEE Bilateral    TUBAL LIGATION     Social History   Social History Narrative   Marital Status:  Married Manufacturing systems engineer)    Children:  Daughter(1) Son (1)    Pets:  None    Living Situation: Lives with husband and daughter.     Occupation:  Retired Licensed conveyancer)    Education: 12th Grade    Tobacco Use/Exposure:  She used to smoke socially during the weekends but quit 40 years ago.    Alcohol Use:  Occasional   Drug Use:  None   Diet:  Regular   Exercise:  None   Hobbies:  Reading and playing volleyball             Immunization History  Administered Date(s) Administered   Influenza,inj,Quad PF,6+ Mos 08/30/2016   Influenza-Unspecified 08/18/2021   Moderna Covid-19 Vaccine Bivalent Booster 3yr & up 08/29/2021   MLevan [place]st  Sars-Covid-2 Vaccination 12/25/2019, 01/11/2020, 02/05/2020   Pneumococcal Conjugate-13 05/22/2015   Pneumococcal Polysaccharide-23 10/06/2021     Objective: Vital Signs: BP 99/64 (BP Location: Left Arm, Patient Position: Sitting, Cuff Size: Large)   Pulse 64   Ht 5' 1.5" (1.562 m)   Wt 212 lb 6.4 oz (96.3 kg)   BMI 39.48 kg/m    Physical Exam Vitals and nursing note reviewed.  Constitutional:      Appearance: She is well-developed.  HENT:     Head: Normocephalic and atraumatic.  Eyes:     Conjunctiva/sclera: Conjunctivae normal.  Cardiovascular:     Rate and Rhythm: Normal rate and regular rhythm.     Heart sounds: Normal heart sounds.  Pulmonary:     Effort: Pulmonary effort is normal.     Breath sounds: Normal breath sounds.  Abdominal:     General: Bowel sounds are normal.     Palpations: Abdomen is soft.  Musculoskeletal:     Cervical back: Normal range of motion.  Lymphadenopathy:     Cervical: No cervical adenopathy.  Skin:    General: Skin is warm and dry.     Capillary Refill: Capillary refill takes less than 2  seconds.  Neurological:     Mental Status: She is alert and oriented to person, place, and time.  Psychiatric:        Behavior: Behavior normal.     Musculoskeletal Exam: She had good range of motion of her cervical spine.  She had thoracolumbar scoliosis and limited range of motion of her lumbar spine.  She had painful range of motion of bilateral shoulder joints.  Elbow joints in good range of motion.  There was no synovitis of her wrist joints or MCPs.  She had bilateral PIP and DIP thickening and incomplete extension of bilateral PIP joints.  No synovitis was noted.  Hip joints with good range of motion.  She had discomfort range of motion of her left hip joint and also tenderness over the left trochanteric bursa.  Bilateral knee joints were replaced without any warmth swelling or effusion.  She had bilateral pedal edema but no synovitis was noted over ankles or MTPs.  CDAI Exam: CDAI Score: -- Patient Global: --; Provider Global: -- Swollen: --; Tender: -- Joint Exam 11/03/2021   No joint exam has been documented for this visit   There is currently no information documented on the homunculus. Go to the Rheumatology activity and complete the homunculus joint exam.  Investigation: No additional findings.  Imaging: DG Chest 2 View  Result Date: 10/06/2021 CLINICAL DATA:  Pedal edema. EXAM: CHEST - 2 VIEW COMPARISON:  06/02/2019 FINDINGS: Normal cardiac silhouette. Mild central venous congestion. No effusion, infiltrate or pneumothorax. No acute osseous abnormality. IMPRESSION: Mild pulmonary venous congestion. Electronically Signed   By: Suzy Bouchard M.D.   On: 10/06/2021 15:09   US Venous Img Lower Bilateral  Result Date: 10/06/2021 CLINICAL DATA:  Bilateral lower extremity pain and edema. Evaluate for DVT. EXAM: BILATERAL LOWER EXTREMITY VENOUS DOPPLER ULTRASOUND TECHNIQUE: Gray-scale sonography with graded compression, as well as color Doppler and duplex ultrasound were  performed to evaluate the lower extremity deep venous systems from the level of the common femoral vein and including the common femoral, femoral, profunda femoral, popliteal and calf veins including the posterior tibial, peroneal and gastrocnemius veins when visible. The superficial great saphenous vein was also interrogated. Spectral Doppler was utilized to evaluate flow at rest and with distal augmentation maneuvers in the common femoral,  femoral and popliteal veins. COMPARISON:  None. FINDINGS: RIGHT LOWER EXTREMITY Common Femoral Vein: No evidence of thrombus. Normal compressibility, respiratory phasicity and response to augmentation. Saphenofemoral Junction: No evidence of thrombus. Normal compressibility and flow on color Doppler imaging. Profunda Femoral Vein: No evidence of thrombus. Normal compressibility and flow on color Doppler imaging. Femoral Vein: No evidence of thrombus. Normal compressibility, respiratory phasicity and response to augmentation. Popliteal Vein: No evidence of thrombus. Normal compressibility, respiratory phasicity and response to augmentation. Calf Veins: No evidence of thrombus. Normal compressibility and flow on color Doppler imaging. Superficial Great Saphenous Vein: No evidence of thrombus. Normal compressibility. Venous Reflux:  None. Other Findings:  None. LEFT LOWER EXTREMITY Common Femoral Vein: No evidence of thrombus. Normal compressibility, respiratory phasicity and response to augmentation. Saphenofemoral Junction: No evidence of thrombus. Normal compressibility and flow on color Doppler imaging. Profunda Femoral Vein: No evidence of thrombus. Normal compressibility and flow on color Doppler imaging. Femoral Vein: No evidence of thrombus. Normal compressibility, respiratory phasicity and response to augmentation. Popliteal Vein: No evidence of thrombus. Normal compressibility, respiratory phasicity and response to augmentation. Calf Veins: No evidence of thrombus. Normal  compressibility and flow on color Doppler imaging. Superficial Great Saphenous Vein: No evidence of thrombus. Normal compressibility. Venous Reflux:  None. Other Findings:  None. IMPRESSION: No evidence of DVT within either lower extremity. Electronically Signed   By: Sandi Mariscal M.D.   On: 10/06/2021 15:35   DG Foot Complete Left  Result Date: 10/06/2021 CLINICAL DATA:  LEFT calcaneus pain EXAM: LEFT FOOT - COMPLETE 3+ VIEW COMPARISON:  None. FINDINGS: There is no evidence of fracture or dislocation. There is no evidence of arthropathy or other focal bone abnormality. Soft tissue swelling of the dorsum of the foot. IMPRESSION: Negative radiograph of the calcaneus. Soft tissue swelling of the dorsum foot. Electronically Signed   By: Suzy Bouchard M.D.   On: 10/06/2021 15:11   XR HIP UNILAT W OR W/O PELVIS 2-3 VIEWS LEFT  Result Date: 11/03/2021 No hip joint narrowing was noted.No erosive changes were noted.  No chondrocalcinosis was noted.  Degenerative changes were noted in the lumbar spine. Impression: Unremarkable x-ray of the hip joint.  XR Hand 2 View Left  Result Date: 11/03/2021 CMC, PIP and DIP narrowing was noted.  No juxta-articular osteopenia was noted.  Cystic changes were noted in the base of the second third and fifth phalanges.  No MCP, intercarpal or radiocarpal joint space narrowing was noted. Impression: These findings are consistent with osteoarthritis.  Cystic changes were noted in the MCP joints which could be consistent with inflammatory arthritis.  XR Hand 2 View Right  Result Date: 11/03/2021 CMC, PIP and DIP narrowing was noted.  No MCP, intercarpal or radiocarpal joint space narrowing was noted.  A cystic versus erosive change was noted at the base of the fifth phalanx.  No juxta-articular osteopenia was noted. Impression: These findings are consistent with osteoarthritis.  The cystic versus erosive change at the base of the fifth phalanx raises the concern about  inflammatory arthritis.   Recent Labs: Lab Results  Component Value Date   WBC 8.4 06/03/2019   HGB 11.7 (L) 06/03/2019   PLT 249 06/03/2019   NA 139 10/06/2021   K 4.4 10/06/2021   CL 103 10/06/2021   CO2 28 10/06/2021   GLUCOSE 101 (H) 10/06/2021   BUN 26 (H) 10/06/2021   CREATININE 1.50 (H) 10/06/2021   BILITOT 0.4 10/06/2021   ALKPHOS 99 10/06/2021   AST  14 10/06/2021   ALT 9 10/06/2021   PROT 6.7 10/06/2021   ALBUMIN 4.1 10/06/2021   CALCIUM 9.4 10/06/2021   GFRAA 51 (L) 06/03/2019   July 08, 2021 ANA negative, RF 41.8, anti-CCP negative, ESR 15, CRP 2, uric acid 6.3  IMPRESSION: Negative radiograph of the calcaneus. Soft tissue swelling of the dorsum foot. Electronically Signed   By: Suzy Bouchard M.D.   On: 10/06/2021 15:11   Speciality Comments: No specialty comments available.  Procedures:  No procedures performed Allergies: Penicillins, Sulfa antibiotics, Amlodipine, and Lisinopril   Assessment / Plan:     Visit Diagnoses: Rheumatoid factor positive-patient had recent labs by Dr. Raeford Razor which showed positive rheumatoid factor.  Anti-CCP was negative.  Sed rate was normal.  Chronic pain of both shoulders-she gives history of pain and discomfort in her bilateral shoulders for many years.  She has some discomfort with range of motion of her shoulders beyond 90 degrees.  Arthralgia of both hands -she complains of pain and discomfort in her bilateral hands.  She has positive rheumatoid factor.  No synovitis was noted over wrist joints or MCPs.  She had bilateral PIP and DIP thickening.  She had limited range of motion of several of her PIP joints.  I offered PT and OT she declined.  Plan: XR Hand 2 View Right, XR Hand 2 View Left.  I discussed x-ray results with the patient.  Cystic versus erosive changes were noted at the MCP joints.  No juxta-articular osteopenia or joint space narrowing was noted.  As she has ongoing pain and discomfort in her hands I will  schedule MRI of the right hand to evaluate this further.  Pain in left hip -she had painful range of motion of her left hip joint.  Plan: XR HIP UNILAT W OR W/O PELVIS 2-3 VIEWS LEFT.  X-ray of the hip joint was unremarkable.  Degenerative changes were noted in the lumbar spine.  X-ray findings were discussed with the patient.  Trochanteric bursitis of left hip-she had tenderness on palpation over left trochanteric bursa.  A handout on IT band exercises was given.  I offered physical therapy which she declined.  History of total bilateral knee replacement - by Dr. Laurance Flatten in [place] in 2013.  She continues to have chronic knee joint pain.  Other idiopathic scoliosis, thoracic region-she has chronic thoracic pain.  DDD (degenerative disc disease), lumbar - With facet joint arthropathy-I reviewed her previous MRI of the lumbar spine which was consistent with degenerative disc disease and facet joint arthropathy.  She continues to have chronic lower back pain.  Primary hypertension-blood pressure was low normal today.  Other medical problems are listed as follows:  Hyperlipidemia, unspecified hyperlipidemia type  Stage 3a chronic kidney disease (HCC) - GFR in the 50s  Type 2 diabetes mellitus with diabetic polyneuropathy, without long-term current use of insulin (HCC)  Moderate nonproliferative diabetic retinopathy of left eye without macular edema associated with type 2 diabetes mellitus ([place])  History of breast cancer - 2007,R mastectomy, no CTX or RTX  Gastroesophageal reflux disease without esophagitis  Primary open angle glaucoma of both eyes, moderate stage  Osteoporosis screening - July 31, 2021 DEXA normal  Orders: Orders Placed This Encounter  Procedures   XR Hand 2 View Right   XR Hand 2 View Left   XR HIP UNILAT W OR W/O PELVIS 2-3 VIEWS LEFT   MR HAND RIGHT WO CONTRAST    No orders of the defined types were placed  in this encounter.    Follow-Up Instructions:  Return for Osteoarthritis, +RF.   Bo Merino, MD  Note - This record has been created using Editor, commissioning.  Chart creation errors have been sought, but may not always  have been located. Such creation errors do not reflect on  the standard of medical care.

## 2021-11-03 ENCOUNTER — Ambulatory Visit (INDEPENDENT_AMBULATORY_CARE_PROVIDER_SITE_OTHER): Payer: HMO

## 2021-11-03 ENCOUNTER — Other Ambulatory Visit: Payer: Self-pay

## 2021-11-03 ENCOUNTER — Ambulatory Visit: Payer: Self-pay

## 2021-11-03 ENCOUNTER — Ambulatory Visit: Payer: HMO | Admitting: Rheumatology

## 2021-11-03 ENCOUNTER — Encounter: Payer: Self-pay | Admitting: Rheumatology

## 2021-11-03 VITALS — BP 99/64 | HR 64 | Ht 61.5 in | Wt 212.4 lb

## 2021-11-03 DIAGNOSIS — M25542 Pain in joints of left hand: Secondary | ICD-10-CM | POA: Diagnosis not present

## 2021-11-03 DIAGNOSIS — M4124 Other idiopathic scoliosis, thoracic region: Secondary | ICD-10-CM | POA: Diagnosis not present

## 2021-11-03 DIAGNOSIS — H401132 Primary open-angle glaucoma, bilateral, moderate stage: Secondary | ICD-10-CM

## 2021-11-03 DIAGNOSIS — G629 Polyneuropathy, unspecified: Secondary | ICD-10-CM

## 2021-11-03 DIAGNOSIS — M7062 Trochanteric bursitis, left hip: Secondary | ICD-10-CM

## 2021-11-03 DIAGNOSIS — M5136 Other intervertebral disc degeneration, lumbar region: Secondary | ICD-10-CM

## 2021-11-03 DIAGNOSIS — R7689 Other specified abnormal immunological findings in serum: Secondary | ICD-10-CM

## 2021-11-03 DIAGNOSIS — M25552 Pain in left hip: Secondary | ICD-10-CM | POA: Diagnosis not present

## 2021-11-03 DIAGNOSIS — Z96653 Presence of artificial knee joint, bilateral: Secondary | ICD-10-CM

## 2021-11-03 DIAGNOSIS — N1831 Chronic kidney disease, stage 3a: Secondary | ICD-10-CM

## 2021-11-03 DIAGNOSIS — M25541 Pain in joints of right hand: Secondary | ICD-10-CM | POA: Diagnosis not present

## 2021-11-03 DIAGNOSIS — G8929 Other chronic pain: Secondary | ICD-10-CM

## 2021-11-03 DIAGNOSIS — Z853 Personal history of malignant neoplasm of breast: Secondary | ICD-10-CM

## 2021-11-03 DIAGNOSIS — E1142 Type 2 diabetes mellitus with diabetic polyneuropathy: Secondary | ICD-10-CM | POA: Diagnosis not present

## 2021-11-03 DIAGNOSIS — I10 Essential (primary) hypertension: Secondary | ICD-10-CM | POA: Diagnosis not present

## 2021-11-03 DIAGNOSIS — M47817 Spondylosis without myelopathy or radiculopathy, lumbosacral region: Secondary | ICD-10-CM

## 2021-11-03 DIAGNOSIS — M25511 Pain in right shoulder: Secondary | ICD-10-CM

## 2021-11-03 DIAGNOSIS — M51369 Other intervertebral disc degeneration, lumbar region without mention of lumbar back pain or lower extremity pain: Secondary | ICD-10-CM

## 2021-11-03 DIAGNOSIS — R768 Other specified abnormal immunological findings in serum: Secondary | ICD-10-CM | POA: Diagnosis not present

## 2021-11-03 DIAGNOSIS — K219 Gastro-esophageal reflux disease without esophagitis: Secondary | ICD-10-CM

## 2021-11-03 DIAGNOSIS — E113392 Type 2 diabetes mellitus with moderate nonproliferative diabetic retinopathy without macular edema, left eye: Secondary | ICD-10-CM

## 2021-11-03 DIAGNOSIS — M1009 Idiopathic gout, multiple sites: Secondary | ICD-10-CM

## 2021-11-03 DIAGNOSIS — Z13820 Encounter for screening for osteoporosis: Secondary | ICD-10-CM

## 2021-11-03 DIAGNOSIS — E785 Hyperlipidemia, unspecified: Secondary | ICD-10-CM | POA: Diagnosis not present

## 2021-11-03 DIAGNOSIS — M25512 Pain in left shoulder: Secondary | ICD-10-CM

## 2021-11-03 NOTE — Patient Instructions (Signed)
Iliotibial Band Syndrome Rehab Ask your health care provider which exercises are safe for you. Do exercises exactly as told by your health care provider and adjust them as directed. It is normal to feel mild stretching, pulling, tightness, or discomfort as you do these exercises. Stop right away if you feel sudden pain or your pain gets significantly worse. Do not begin these exercises until told by your health care provider. Stretching and range-of-motion exercises These exercises warm up your muscles and joints and improve the movement and flexibility of your hip and pelvis. Quadriceps stretch, prone  Lie on your abdomen (prone position) on a firm surface, such as a bed or padded floor. Bend your left / right knee and reach back to hold your ankle or pant leg. If you cannot reach your ankle or pant leg, loop a belt around your foot and grab the belt instead. Gently pull your heel toward your buttocks. Your knee should not slide out to the side. You should feel a stretch in the front of your thigh and knee (quadriceps). Hold this position for __________ seconds. Repeat __________ times. Complete this exercise __________ times a day. Iliotibial band stretch An iliotibial band is a strong band of muscle tissue that runs from the outer side of your hip to the outer side of your thigh and knee. Lie on your side with your left / right leg in the top position. Bend both of your knees and grab your left / right ankle. Stretch out your bottom arm to help you balance. Slowly bring your top knee back so your thigh goes behind your trunk. Slowly lower your top leg toward the floor until you feel a gentle stretch on the outside of your left / right hip and thigh. If you do not feel a stretch and your knee will not fall farther, place the heel of your other foot on top of your knee and pull your knee down toward the floor with your foot. Hold this position for __________ seconds. Repeat __________ times.  Complete this exercise __________ times a day. Strengthening exercises These exercises build strength and endurance in your hip and pelvis. Endurance is the ability to use your muscles for a long time, even after they get tired. Straight leg raises, side-lying This exercise strengthens the muscles that rotate the leg at the hip and move it away from your body (hip abductors). Lie on your side with your left / right leg in the top position. Lie so your head, shoulder, hip, and knee line up. You may bend your bottom knee to help you balance. Roll your hips slightly forward so your hips are stacked directly over each other and your left / right knee is facing forward. Tense the muscles in your outer thigh and lift your top leg 4-6 inches (10-15 cm). Hold this position for __________ seconds. Slowly lower your leg to return to the starting position. Let your muscles relax completely before doing another repetition. Repeat __________ times. Complete this exercise __________ times a day. Leg raises, prone This exercise strengthens the muscles that move the hips backward (hip extensors). Lie on your abdomen (prone position) on your bed or a firm surface. You can put a pillow under your hips if that is more comfortable for your lower back. Bend your left / right knee so your foot is straight up in the air. Squeeze your buttocks muscles and lift your left / right thigh off the bed. Do not let your back arch. Tense   your thigh muscle as hard as you can without increasing any knee pain. Hold this position for __________ seconds. Slowly lower your leg to return to the starting position and allow it to relax completely. Repeat __________ times. Complete this exercise __________ times a day. Hip hike Stand sideways on a bottom step. Stand on your left / right leg with your other foot unsupported next to the step. You can hold on to a railing or wall for balance if needed. Keep your knees straight and your  torso square. Then lift your left / right hip up toward the ceiling. Slowly let your left / right hip lower toward the floor, past the starting position. Your foot should get closer to the floor. Do not lean or bend your knees. Repeat __________ times. Complete this exercise __________ times a day. This information is not intended to replace advice given to you by your health care provider. Make sure you discuss any questions you have with your health care provider. Document Revised: 01/24/2020 Document Reviewed: 01/24/2020 Elsevier Patient Education  [place] Exercises Hand exercises can be helpful for almost anyone. These exercises can strengthen the hands, improve flexibility and movement, and increase blood flow to the hands. These results can make work and daily tasks easier. Hand exercises can be especially helpful for people who have joint pain from arthritis or have nerve damage from overuse (carpal tunnel syndrome). These exercises can also help people who have injured a hand. Exercises Most of these hand exercises are gentle stretching and motion exercises. It is usually safe to do them often throughout the day. Warming up your hands before exercise may help to reduce stiffness. You can do this with gentle massage or by placing your hands in warm water for 10-15 minutes. It is normal to feel some stretching, pulling, tightness, or mild discomfort as you begin new exercises. This will gradually improve. Stop an exercise right away if you feel sudden, severe pain or your pain gets worse. Ask your health care provider which exercises are best for you. Knuckle bend or "claw" fist  Stand or sit with your arm, hand, and all five fingers pointed straight up. Make sure to keep your wrist straight during the exercise. Gently bend your fingers down toward your palm until the tips of your fingers are touching the top of your palm. Keep your big knuckle straight and just bend the small  knuckles in your fingers. Hold this position for __________ seconds. Straighten (extend) your fingers back to the starting position. Repeat this exercise 5-10 times with each hand. Full finger fist  Stand or sit with your arm, hand, and all five fingers pointed straight up. Make sure to keep your wrist straight during the exercise. Gently bend your fingers into your palm until the tips of your fingers are touching the middle of your palm. Hold this position for __________ seconds. Extend your fingers back to the starting position, stretching every joint fully. Repeat this exercise 5-10 times with each hand. Straight fist Stand or sit with your arm, hand, and all five fingers pointed straight up. Make sure to keep your wrist straight during the exercise. Gently bend your fingers at the big knuckle, where your fingers meet your hand, and the middle knuckle. Keep the knuckle at the tips of your fingers straight and try to touch the bottom of your palm. Hold this position for __________ seconds. Extend your fingers back to the starting position, stretching every joint fully. Repeat this  exercise 5-10 times with each hand. Tabletop  Stand or sit with your arm, hand, and all five fingers pointed straight up. Make sure to keep your wrist straight during the exercise. Gently bend your fingers at the big knuckle, where your fingers meet your hand, as far down as you can while keeping the small knuckles in your fingers straight. Think of forming a tabletop with your fingers. Hold this position for __________ seconds. Extend your fingers back to the starting position, stretching every joint fully. Repeat this exercise 5-10 times with each hand. Finger spread  Place your hand flat on a table with your palm facing down. Make sure your wrist stays straight as you do this exercise. Spread your fingers and thumb apart from each other as far as you can until you feel a gentle stretch. Hold this position  for __________ seconds. Bring your fingers and thumb tight together again. Hold this position for __________ seconds. Repeat this exercise 5-10 times with each hand. Making circles  Stand or sit with your arm, hand, and all five fingers pointed straight up. Make sure to keep your wrist straight during the exercise. Make a circle by touching the tip of your thumb to the tip of your index finger. Hold for __________ seconds. Then open your hand wide. Repeat this motion with your thumb and each finger on your hand. Repeat this exercise 5-10 times with each hand. Thumb motion  Sit with your forearm resting on a table and your wrist straight. Your thumb should be facing up toward the ceiling. Keep your fingers relaxed as you move your thumb. Lift your thumb up as high as you can toward the ceiling. Hold for __________ seconds. Bend your thumb across your palm as far as you can, reaching the tip of your thumb for the small finger (pinkie) side of your palm. Hold for __________ seconds. Repeat this exercise 5-10 times with each hand. Grip strengthening  Hold a stress ball or other soft ball in the middle of your hand. Slowly increase the pressure, squeezing the ball as much as you can without causing pain. Think of bringing the tips of your fingers into the middle of your palm. All of your finger joints should bend when doing this exercise. Hold your squeeze for __________ seconds, then relax. Repeat this exercise 5-10 times with each hand. Contact a health care provider if: Your hand pain or discomfort gets much worse when you do an exercise. Your hand pain or discomfort does not improve within 2 hours after you exercise. If you have any of these problems, stop doing these exercises right away. Do not do them again unless your health care provider says that you can. Get help right away if: You develop sudden, severe hand pain or swelling. If this happens, stop doing these exercises right away.  Do not do them again unless your health care provider says that you can. This information is not intended to replace advice given to you by your health care provider. Make sure you discuss any questions you have with your health care provider. Document Revised: 03/06/2021 Document Reviewed: 03/06/2021 Elsevier Patient Education  [place].

## 2021-11-13 ENCOUNTER — Ambulatory Visit (HOSPITAL_COMMUNITY)
Admission: RE | Admit: 2021-11-13 | Discharge: 2021-11-13 | Disposition: A | Discharge status: Home / Self Care | Payer: HMO | Source: Ambulatory Visit | Attending: Rheumatology | Admitting: Rheumatology

## 2021-11-13 ENCOUNTER — Other Ambulatory Visit: Payer: Self-pay

## 2021-11-13 DIAGNOSIS — M25541 Pain in joints of right hand: Secondary | ICD-10-CM | POA: Insufficient documentation

## 2021-11-13 DIAGNOSIS — M25542 Pain in joints of left hand: Secondary | ICD-10-CM | POA: Diagnosis not present

## 2021-11-18 ENCOUNTER — Ambulatory Visit: Payer: HMO | Admitting: Rheumatology

## 2021-11-20 ENCOUNTER — Emergency Department: Payer: HMO

## 2021-11-20 ENCOUNTER — Observation Stay
Admission: EM | Admit: 2021-11-20 | Discharge: 2021-11-21 | Disposition: A | Discharge status: Home / Self Care | Payer: HMO | Attending: Internal Medicine | Admitting: Internal Medicine

## 2021-11-20 ENCOUNTER — Other Ambulatory Visit: Payer: Self-pay

## 2021-11-20 DIAGNOSIS — Z20822 Contact with and (suspected) exposure to covid-19: Secondary | ICD-10-CM | POA: Insufficient documentation

## 2021-11-20 DIAGNOSIS — Z87891 Personal history of nicotine dependence: Secondary | ICD-10-CM | POA: Insufficient documentation

## 2021-11-20 DIAGNOSIS — R778 Other specified abnormalities of plasma proteins: Secondary | ICD-10-CM

## 2021-11-20 DIAGNOSIS — R0602 Shortness of breath: Secondary | ICD-10-CM | POA: Insufficient documentation

## 2021-11-20 DIAGNOSIS — Z853 Personal history of malignant neoplasm of breast: Secondary | ICD-10-CM | POA: Insufficient documentation

## 2021-11-20 DIAGNOSIS — I119 Hypertensive heart disease without heart failure: Secondary | ICD-10-CM

## 2021-11-20 DIAGNOSIS — R0789 Other chest pain: Secondary | ICD-10-CM

## 2021-11-20 DIAGNOSIS — Z880 Allergy status to penicillin: Secondary | ICD-10-CM | POA: Insufficient documentation

## 2021-11-20 DIAGNOSIS — E119 Type 2 diabetes mellitus without complications: Secondary | ICD-10-CM | POA: Insufficient documentation

## 2021-11-20 DIAGNOSIS — R748 Abnormal levels of other serum enzymes: Secondary | ICD-10-CM | POA: Insufficient documentation

## 2021-11-20 DIAGNOSIS — Z791 Long term (current) use of non-steroidal anti-inflammatories (NSAID): Secondary | ICD-10-CM | POA: Insufficient documentation

## 2021-11-20 DIAGNOSIS — I25119 Atherosclerotic heart disease of native coronary artery with unspecified angina pectoris: Principal | ICD-10-CM | POA: Insufficient documentation

## 2021-11-20 DIAGNOSIS — Z801 Family history of malignant neoplasm of trachea, bronchus and lung: Secondary | ICD-10-CM | POA: Diagnosis not present

## 2021-11-20 DIAGNOSIS — E118 Type 2 diabetes mellitus with unspecified complications: Secondary | ICD-10-CM

## 2021-11-20 DIAGNOSIS — Z79899 Other long term (current) drug therapy: Secondary | ICD-10-CM | POA: Diagnosis not present

## 2021-11-20 DIAGNOSIS — I209 Angina pectoris, unspecified: Secondary | ICD-10-CM

## 2021-11-20 DIAGNOSIS — Z7985 Long-term (current) use of injectable non-insulin antidiabetic drugs: Secondary | ICD-10-CM | POA: Diagnosis not present

## 2021-11-20 DIAGNOSIS — A419 Sepsis, unspecified organism: Secondary | ICD-10-CM

## 2021-11-20 DIAGNOSIS — I1310 Hypertensive heart and chronic kidney disease without heart failure, with stage 1 through stage 4 chronic kidney disease, or unspecified chronic kidney disease: Secondary | ICD-10-CM | POA: Insufficient documentation

## 2021-11-20 DIAGNOSIS — N183 Chronic kidney disease, stage 3 unspecified: Secondary | ICD-10-CM | POA: Insufficient documentation

## 2021-11-20 DIAGNOSIS — I2510 Atherosclerotic heart disease of native coronary artery without angina pectoris: Secondary | ICD-10-CM

## 2021-11-20 DIAGNOSIS — R079 Chest pain, unspecified: Secondary | ICD-10-CM

## 2021-11-20 DIAGNOSIS — R7989 Other specified abnormal findings of blood chemistry: Secondary | ICD-10-CM

## 2021-11-20 LAB — CBC
HCT: 39.9 % (ref 36.0–46.0)
Hemoglobin: 12.9 g/dL (ref 12.0–15.0)
MCH: 30.1 pg (ref 26.0–34.0)
MCHC: 32.3 g/dL (ref 30.0–36.0)
MCV: 93.2 fL (ref 80.0–100.0)
Platelets: 196 10*3/uL (ref 150–400)
RBC: 4.28 MIL/uL (ref 3.87–5.11)
RDW: 14.4 % (ref 11.5–15.5)
WBC: 15.3 10*3/uL — ABNORMAL HIGH (ref 4.0–10.5)
nRBC: 0 % (ref 0.0–0.2)

## 2021-11-20 LAB — BASIC METABOLIC PANEL
Anion gap: 7 (ref 5–15)
BUN: 31 mg/dL — ABNORMAL HIGH (ref 8–23)
CO2: 27 mmol/L (ref 22–32)
Calcium: 9.5 mg/dL (ref 8.9–10.3)
Chloride: 105 mmol/L (ref 98–111)
Creatinine, Ser: 1.23 mg/dL — ABNORMAL HIGH (ref 0.44–1.00)
GFR, Estimated: 45 mL/min — ABNORMAL LOW (ref >60)
Glucose, Bld: 164 mg/dL — ABNORMAL HIGH (ref 70–99)
Potassium: 4.6 mmol/L (ref 3.5–5.1)
Sodium: 139 mmol/L (ref 135–145)

## 2021-11-20 LAB — TROPONIN I (HIGH SENSITIVITY)
Troponin I (High Sensitivity): 45 ng/L — ABNORMAL HIGH (ref <18)
Troponin I (High Sensitivity): 49 ng/L — ABNORMAL HIGH (ref <18)
Troponin I (High Sensitivity): 99 ng/L — ABNORMAL HIGH (ref <18)
Troponin I (High Sensitivity): 105 ng/L (ref <18)

## 2021-11-20 LAB — LACTIC ACID, PLASMA: Lactic Acid, Venous: 1.7 mmol/L (ref 0.5–1.9)

## 2021-11-20 LAB — BRAIN NATRIURETIC PEPTIDE: B Natriuretic Peptide: 255.8 pg/mL — ABNORMAL HIGH (ref 0.0–100.0)

## 2021-11-20 LAB — PROCALCITONIN: Procalcitonin: 1.62 ng/mL

## 2021-11-20 LAB — RESP PANEL BY RT-PCR (FLU A&B, COVID) ARPGX2
Influenza A by PCR: NEGATIVE
Influenza B by PCR: NEGATIVE
SARS Coronavirus 2 by RT PCR: NEGATIVE

## 2021-11-20 MED ORDER — LATANOPROST 0.005 % OP SOLN
1.0000 [drp] | Freq: Every day | OPHTHALMIC | Status: DC
  Administered 2021-11-21: 10:00:00 1 [drp] via OPHTHALMIC
  Filled 2021-11-20: qty 2.5

## 2021-11-20 MED ORDER — METOPROLOL SUCCINATE ER 25 MG PO TB24
25.0000 mg | ORAL_TABLET | Freq: Every day | ORAL | Status: DC
  Administered 2021-11-20 – 2021-11-21 (×2): 25 mg via ORAL
  Filled 2021-11-20 (×2): qty 1

## 2021-11-20 MED ORDER — METRONIDAZOLE 500 MG/100ML IV SOLN
500.0000 mg | Freq: Once | INTRAVENOUS | Status: AC
  Administered 2021-11-20: 12:00:00 500 mg via INTRAVENOUS
  Filled 2021-11-20: qty 100

## 2021-11-20 MED ORDER — PRAVASTATIN SODIUM 20 MG PO TABS
20.0000 mg | ORAL_TABLET | Freq: Every day | ORAL | Status: DC
  Administered 2021-11-20: 21:00:00 20 mg via ORAL
  Filled 2021-11-20: qty 1

## 2021-11-20 MED ORDER — ONDANSETRON 4 MG PO TBDP
4.0000 mg | ORAL_TABLET | Freq: Three times a day (TID) | ORAL | Status: DC | PRN
  Filled 2021-11-20: qty 1

## 2021-11-20 MED ORDER — IOHEXOL 350 MG/ML SOLN
60.0000 mL | Freq: Once | INTRAVENOUS | Status: AC | PRN
  Administered 2021-11-20: 10:00:00 60 mL via INTRAVENOUS
  Filled 2021-11-20: qty 60

## 2021-11-20 MED ORDER — GABAPENTIN 300 MG PO CAPS
300.0000 mg | ORAL_CAPSULE | Freq: Three times a day (TID) | ORAL | Status: DC
  Administered 2021-11-20 – 2021-11-21 (×2): 300 mg via ORAL
  Filled 2021-11-20 (×2): qty 1

## 2021-11-20 MED ORDER — PANTOPRAZOLE SODIUM 40 MG PO TBEC
40.0000 mg | DELAYED_RELEASE_TABLET | Freq: Every day | ORAL | Status: DC
  Administered 2021-11-20 – 2021-11-21 (×2): 40 mg via ORAL
  Filled 2021-11-20 (×2): qty 1

## 2021-11-20 MED ORDER — BRIMONIDINE TARTRATE 0.2 % OP SOLN
1.0000 [drp] | Freq: Two times a day (BID) | OPHTHALMIC | Status: DC
  Administered 2021-11-21: 10:00:00 1 [drp] via OPHTHALMIC
  Filled 2021-11-20 (×2): qty 5

## 2021-11-20 MED ORDER — SODIUM CHLORIDE 0.9 % IV SOLN
500.0000 mg | INTRAVENOUS | Status: DC
  Administered 2021-11-20 – 2021-11-21 (×2): 500 mg via INTRAVENOUS
  Filled 2021-11-20: qty 5
  Filled 2021-11-20: qty 500
  Filled 2021-11-20: qty 5

## 2021-11-20 MED ORDER — SODIUM CHLORIDE 0.9 % IV SOLN: Freq: Once | INTRAVENOUS | Status: AC

## 2021-11-20 MED ORDER — ACETAMINOPHEN 500 MG PO TABS
1000.0000 mg | ORAL_TABLET | Freq: Once | ORAL | Status: AC
  Administered 2021-11-20: 10:00:00 1000 mg via ORAL
  Filled 2021-11-20: qty 2

## 2021-11-20 MED ORDER — LIDOCAINE 5 % EX PTCH
1.0000 | MEDICATED_PATCH | CUTANEOUS | Status: DC
  Administered 2021-11-20 – 2021-11-21 (×2): 1 via TRANSDERMAL
  Filled 2021-11-20 (×2): qty 1

## 2021-11-20 MED ORDER — ONDANSETRON HCL 4 MG/2ML IJ SOLN: 4.0000 mg | Freq: Four times a day (QID) | INTRAMUSCULAR | Status: DC | PRN

## 2021-11-20 MED ORDER — NITROGLYCERIN 0.4 MG SL SUBL
0.4000 mg | SUBLINGUAL_TABLET | Freq: Once | SUBLINGUAL | Status: AC
  Administered 2021-11-20: 20:00:00 0.4 mg via SUBLINGUAL
  Filled 2021-11-20: qty 1

## 2021-11-20 MED ORDER — GABAPENTIN 300 MG PO CAPS: 300.0000 mg | ORAL_CAPSULE | Freq: Every evening | ORAL | Status: DC | PRN

## 2021-11-20 MED ORDER — ASPIRIN 81 MG PO CHEW
81.0000 mg | CHEWABLE_TABLET | Freq: Every day | ORAL | Status: DC
  Administered 2021-11-20 – 2021-11-21 (×2): 81 mg via ORAL
  Filled 2021-11-20 (×2): qty 1

## 2021-11-20 MED ORDER — GUAIFENESIN-DM 100-10 MG/5ML PO SYRP: 10.0000 mL | ORAL_SOLUTION | Freq: Four times a day (QID) | ORAL | Status: DC | PRN

## 2021-11-20 MED ORDER — ENOXAPARIN SODIUM 40 MG/0.4ML IJ SOSY
40.0000 mg | PREFILLED_SYRINGE | INTRAMUSCULAR | Status: DC
  Administered 2021-11-20 – 2021-11-21 (×2): 40 mg via SUBCUTANEOUS
  Filled 2021-11-20 (×2): qty 0.4

## 2021-11-20 MED ORDER — ACETAMINOPHEN 500 MG PO TABS
1000.0000 mg | ORAL_TABLET | Freq: Three times a day (TID) | ORAL | Status: DC | PRN
  Administered 2021-11-20 – 2021-11-21 (×2): 1000 mg via ORAL
  Filled 2021-11-20 (×2): qty 2

## 2021-11-20 MED ORDER — SODIUM CHLORIDE 0.9 % IV SOLN
2.0000 g | INTRAVENOUS | Status: DC
  Administered 2021-11-20 – 2021-11-21 (×2): 2 g via INTRAVENOUS
  Filled 2021-11-20: qty 20
  Filled 2021-11-20: qty 2
  Filled 2021-11-20: qty 20

## 2021-11-20 MED ORDER — MONTELUKAST SODIUM 10 MG PO TABS
10.0000 mg | ORAL_TABLET | Freq: Every day | ORAL | Status: DC
  Administered 2021-11-20: 21:00:00 10 mg via ORAL
  Filled 2021-11-20: qty 1

## 2021-11-20 MED ORDER — TIMOLOL MALEATE 0.5 % OP SOLN
1.0000 [drp] | Freq: Two times a day (BID) | OPHTHALMIC | Status: DC
  Administered 2021-11-21: 10:00:00 1 [drp] via OPHTHALMIC
  Filled 2021-11-20 (×2): qty 5

## 2021-11-20 MED ORDER — OXYCODONE HCL 5 MG PO TABS
2.5000 mg | ORAL_TABLET | Freq: Once | ORAL | Status: AC
  Administered 2021-11-20: 10:00:00 2.5 mg via ORAL
  Filled 2021-11-20: qty 1

## 2021-11-20 NOTE — H&P (Signed)
History and Physical    Donna Fuller HQI:[id number] DOB: 05-17-1941 DOA: 11/20/2021  PCP: Mackie Pai, PA-C  Patient coming from: home  I have personally briefly reviewed patient's old medical records in [place]  Chief Complaint: chest pain and cough  HPI: Donna Fuller is a 80 y.o. female with medical history significant of breast cancer s/p mastectomy, HTN, DM2 who presented with chest pain and cough.  Pt's main complaint is chest pain that's located in the upper center chest, deep down, sharp, worse with deep breathing, and associated with dyspnea.  Chest pain started yesterday that comes and goes.  Pt said she had this kind of chest pain previously when she presented with reportedly aspiration PNA and had mild elevation of trop to ~200 then as well.  Pt also reported some cough without sputum production for several days.  No fever, abdominal pain, vomiting, diarrhea, dysuria.  Pt has chronic LE swelling that's unchanged.  No issues with swallowing, but has reduced appetite.  Pt said she has had recent cardiac stress test that was neg.   ED Course: initial vitals: afebrile, pulse 78, BP 131/58, sating 98% on room air.  Labs notable for WBC 15.3, procal 1.62, trop 45, 49, 99.  EKG no ACS-related changes.  CTA neg for PE, with Minimal bibasilar densities, likely atelectasis.  Noted Cardiomegaly. Extensive coronary artery and aortic calcifications.  Pt received ceftriaxone, azithromycin and Flagyl in the ED.  ED provider requested admission for observation.   Assessment/Plan Principal Problem:   Atypical chest pain  # Chest pain --associated with dyspnea.  trop 45, 49, 99.  EKG no acute finding.  However, given extensive coronary calcifications seen on CT imaging, will consult cardiology. Plan: --routine consult order placed to [facility] --trend trop --start heparin gtt if trop significantly increases --Echo  # Possible PNA --WBC and procal elevated, no definitely  finding of PNA on CTA chest.  Has some cough and dyspnea but no hypoxia. --started on ceftriaxone, azithromycin and Flagyl in the ED Plan: --cont ceftriaxone and azithromycin for now --trend WBC and procal  # DM2 --A1c 6.0 one month ago. --no need for BG checks or SSI here.  # HTN --cont home Toprol --hold home losartan and Maxzide for now  # HLD --cont home statin   DVT prophylaxis: Lovenox SQ Code Status: Full code  Family Communication: husband updated at bedside today  Disposition Plan: home  Consults called: cardiology Level of care: Med-Surg   Review of Systems: As per HPI otherwise complete review of systems negative.   Past Medical History:  Diagnosis Date   Arthritis    Breast cancer [facility]) 2007   Right breast, s/p mastectomy   Diabetes mellitus without complication ([place])    GERD (gastroesophageal reflux disease)    Gout    Hyperlipidemia    Hypertension    Spinal stenosis     Past Surgical History:  Procedure Laterality Date   ABDOMINAL HYSTERECTOMY     BREAST BIOPSY Left    2016 benign    MASTECTOMY     right side   REPLACEMENT TOTAL KNEE Bilateral    TUBAL LIGATION       reports that she has quit smoking. Her smoking use included cigarettes. She has never used smokeless tobacco. She reports that she does not drink alcohol and does not use drugs.  Allergies  Allergen Reactions   Penicillins Swelling   Sulfa Antibiotics Swelling   Amlodipine Swelling    LE  Swelling   Lisinopril Cough    Family History  Problem Relation Age of Onset   Diabetes Mother    Alzheimer's disease Mother    Hypertension Mother    Hypertension Father    Heart disease Father    Hyperlipidemia Father    Kidney disease Father    Diabetes Sister    Cancer Brother        lung   Alzheimer's disease Brother    Heart disease Brother    Hypertension Brother    Diabetes Brother    Hypertension Brother    Diabetes Brother    Hyperlipidemia Son    Hypertension Son     Diabetes Son    Hypertension Daughter    Hyperlipidemia Daughter      Prior to Admission medications   Medication Sig Start Date End Date Taking? Authorizing Provider  acetaminophen (TYLENOL) 650 MG CR tablet Take 650 mg by mouth 2 (two) times daily.   Yes [provider]  ascorbic acid (VITAMIN C) 500 MG tablet Take 500 mg by mouth daily.   Yes [provider]  aspirin 81 MG tablet Take 81 mg by mouth daily.   Yes [provider]  baclofen (LIORESAL) 10 MG tablet Take 10 mg by mouth 3 (three) times daily as needed. 11/01/19  Yes [provider]  Biotin 1000 MCG CHEW Chew by mouth.   Yes [provider]  brimonidine (ALPHAGAN) 0.2 % ophthalmic solution Place 1 drop into both eyes 2 (two) times a day. 05/08/19  Yes [provider]  Cholecalciferol (VITAMIN D-3) 5000 units TABS Take 1 tablet by mouth daily.   Yes [provider]  gabapentin (NEURONTIN) 300 MG capsule TAKE 1 CAPSULE BY MOUTH THREE TIMES DAILY ,  MAY  TAKE  AN  ADDITIONAL  CAPSULE  IN  THE  EVENING Patient taking differently: Take 300 mg by mouth See admin instructions. Take 331m three times daily, may take an additional 301min the evening. 11/20/17  Yes SoLeone HavenMD  glipiZIDE (GLUCOTROL) 5 MG tablet Take 1 tablet (5 mg total) by mouth daily before breakfast AND 0.5 tablets (2.5 mg total) daily before supper. 10/16/21  Yes Shamleffer, IbMelanie CrazierMD  ketorolac (ACULAR) 0.5 % ophthalmic solution INSTILL 1 DROP IN RIGHT EYE FOUR TIMES DAILY 02/11/21  Yes Rankin, GaClent DemarkMD  latanoprost (XALATAN) 0.005 % ophthalmic solution Place 1 drop into both eyes daily. 05/05/19  Yes [provider]  losartan (COZAAR) 50 MG tablet Take 50 mg by mouth daily. 11/08/19  Yes [provider]  Magnesium 250 MG TABS Take 1 tablet by mouth daily.   Yes [provider]  meloxicam (MOBIC) 7.5 MG tablet Take 1 tablet (7.5 mg total) by mouth daily as  needed. 07/29/21  Yes ScRosemarie AxMD  metoprolol succinate (TOPROL-XL) 25 MG 24 hr tablet Take 25 mg by mouth daily. 05/06/20  Yes [provider]  montelukast (SINGULAIR) 10 MG tablet Take 10 mg by mouth at bedtime.   Yes [provider]  omeprazole (PRILOSEC) 40 MG capsule Take 1 capsule (40 mg total) by mouth daily. Must keep appt on 12/06/16 with Dr. SoCaryl Bis2/13/18  Yes SoLeone HavenMD  pioglitazone (ACTOS) 15 MG tablet Take 1 tablet (15 mg total) by mouth daily. 10/16/21  Yes Shamleffer, IbMelanie CrazierMD  pravastatin (PRAVACHOL) 20 MG tablet TAKE 1 TABLET BY MOUTH ONCE DAILY Patient taking differently: Take 20 mg by mouth daily. 09/27/17  Yes Leone Haven, MD  timolol (TIMOPTIC) 0.5 % ophthalmic solution Place 1 drop into both eyes 2 (two) times a day. 03/15/19  Yes [provider]  triamterene-hydrochlorothiazide (MAXZIDE-25) 37.5-25 MG tablet Take 1 tablet by mouth daily. 05/29/21  Yes [provider]  zinc gluconate 50 MG tablet Take 50 mg by mouth daily.   Yes [provider]  blood glucose meter kit and supplies KIT Dispense based on patient and insurance preference. Use up to four times daily as directed. (FOR ICD-9 250.00, 250.01). 07/09/17   Thersa Salt G, DO  Dulaglutide (TRULICITY) 3 FB/5.1WC SOPN Inject 3 mg as directed once a week. 10/16/21   Shamleffer, Melanie Crazier, MD  ferrous sulfate 325 (65 FE) MG tablet Take 325 mg by mouth daily with breakfast. Patient not taking: Reported on 11/03/2021    [provider]  furosemide (LASIX) 20 MG tablet Take 20 mg by mouth daily. Patient not taking: Reported on 11/03/2021 11/08/19   [provider]  glucose blood (ONE TOUCH ULTRA TEST) test strip USE AS DIRECTED TO CHECK BLOOD SUGAR 2-3 TIMES A DAY Pt will need an OV for more refills 10/21/21   Shamleffer, Melanie Crazier, MD  OneTouch Delica Lancets 58N MISC Use as instructed to check blood sugar 2-3 times a  day.  DX E11.9 10/21/21   Shamleffer, Melanie Crazier, MD    Physical Exam: Vitals:   11/20/21 1130 11/20/21 1200 11/20/21 1415 11/20/21 1500  BP: (!) 141/69 (!) 147/67  138/70  Pulse: 83 77 72 74  Resp: (!) 24 (!) 21 (!) 28 20  Temp:      TempSrc:      SpO2: 95% 98% 99% 99%  Weight:      Height:        Constitutional: NAD, AAOx3 HEENT: conjunctivae and lids normal, EOMI CV: No cyanosis.   RESP: reduced effort, on RA SKIN: warm, dry Neuro: II - XII grossly intact.   Psych: Normal mood and affect.  Appropriate judgement and reason   Labs on Admission: I have personally reviewed following labs and imaging studies  CBC: Recent Labs  Lab 11/20/21 0741  WBC 15.3*  HGB 12.9  HCT 39.9  MCV 93.2  PLT 277   Basic Metabolic Panel: Recent Labs  Lab 11/20/21 0741  NA 139  K 4.6  CL 105  CO2 27  GLUCOSE 164*  BUN 31*  CREATININE 1.23*  CALCIUM 9.5   GFR: Estimated Creatinine Clearance: 38.1 mL/min (A) (by C-G formula based on SCr of 1.23 mg/dL (H)). Liver Function Tests: No results for input(s): AST, ALT, ALKPHOS, BILITOT, PROT, ALBUMIN in the last 168 hours. No results for input(s): LIPASE, AMYLASE in the last 168 hours. No results for input(s): AMMONIA in the last 168 hours. Coagulation Profile: No results for input(s): INR, PROTIME in the last 168 hours. Cardiac Enzymes: No results for input(s): CKTOTAL, CKMB, CKMBINDEX, TROPONINI in the last 168 hours. BNP (last 3 results) Recent Labs    10/08/21 1310  PROBNP 193.0*   HbA1C: No results for input(s): HGBA1C in the last 72 hours. CBG: No results for input(s): GLUCAP in the last 168 hours. Lipid Profile: No results for input(s): CHOL, HDL, LDLCALC, TRIG, CHOLHDL, LDLDIRECT in the last 72 hours. Thyroid Function Tests: No results for input(s): TSH, T4TOTAL, FREET4, T3FREE, THYROIDAB in the last 72 hours. Anemia Panel: No results for input(s): VITAMINB12, FOLATE, FERRITIN, TIBC, IRON, RETICCTPCT in the  last 72 hours. Urine analysis:  Component Value Date/Time   COLORURINE YELLOW 05/31/2019 1632   APPEARANCEUR CLOUDY (A) 05/31/2019 1632   LABSPEC 1.023 05/31/2019 1632   PHURINE 5.0 05/31/2019 1632   GLUCOSEU NEGATIVE 05/31/2019 1632   HGBUR MODERATE (A) 05/31/2019 1632   BILIRUBINUR NEGATIVE 05/31/2019 1632   BILIRUBINUR negative 07/19/2017 1429   KETONESUR 5 (A) 05/31/2019 1632   PROTEINUR Negative 03/11/2020 1047   PROTEINUR 100 (A) 05/31/2019 1632   UROBILINOGEN 0.2 07/19/2017 1429   UROBILINOGEN 0.2 04/19/2015 1215   NITRITE negative 03/11/2020 1047   NITRITE NEGATIVE 05/31/2019 1632   LEUKOCYTESUR Negative 03/11/2020 1047   LEUKOCYTESUR NEGATIVE 05/31/2019 1632    Radiological Exams on Admission: DG Chest 2 View  Result Date: 11/20/2021 CLINICAL DATA:  Dyspnea and chest pain EXAM: CHEST - 2 VIEW COMPARISON:  10/06/2021 chest radiograph. FINDINGS: Surgical clips overlie the right axilla. Stable cardiomediastinal silhouette with mild cardiomegaly. No pneumothorax. No pleural effusion. Patchy bibasilar lung opacities, left greater than right. No overt pulmonary edema. IMPRESSION: Patchy bibasilar lung opacities, left greater than right, which could represent aspiration, pneumonia or atelectasis. Recommend follow-up chest radiographs to resolution. Stable cardiomegaly without overt pulmonary edema. Electronically Signed   By: Ilona Sorrel M.D.   On: 11/20/2021 08:25   CT Angio Chest PE W and/or Wo Contrast  Result Date: 11/20/2021 CLINICAL DATA:  Shortness of breath, cough EXAM: CT ANGIOGRAPHY CHEST WITH CONTRAST TECHNIQUE: Multidetector CT imaging of the chest was performed using the standard protocol during bolus administration of intravenous contrast. Multiplanar CT image reconstructions and MIPs were obtained to evaluate the vascular anatomy. CONTRAST:  80m OMNIPAQUE IOHEXOL 350 MG/ML SOLN COMPARISON:  05/30/2019 FINDINGS: Cardiovascular: Cardiomegaly. Extensive coronary  artery and aortic calcifications. No aneurysm. No filling defects in the pulmonary arteries to suggest pulmonary emboli. Mediastinum/Nodes: No mediastinal, hilar, or axillary adenopathy. Trachea and esophagus are unremarkable. Thyroid unremarkable. Lungs/Pleura: Minimal bibasilar densities, likely atelectasis. No other confluent opacities or effusions. Upper Abdomen: Imaging into the upper abdomen demonstrates no acute findings. Musculoskeletal: Chest wall soft tissues are unremarkable. Prior right mastectomy. No acute bony abnormality. Review of the MIP images confirms the above findings. IMPRESSION: No evidence of pulmonary embolus. Cardiomegaly, diffuse coronary artery disease. Bibasilar atelectasis. Aortic Atherosclerosis (ICD10-I70.0). Electronically Signed   By: KRolm BaptiseM.D.   On: 11/20/2021 10:39      TEnzo BiMD [facility]  If 7PM-7AM, please contact night-coverage 11/20/2021, 7:05 PM

## 2021-11-20 NOTE — Consult Note (Signed)
CODE SEPSIS - PHARMACY COMMUNICATION  **Broad Spectrum Antibiotics should be administered within 1 hour of Sepsis diagnosis**  Time Code Sepsis Called/Page Received: 1057  Antibiotics Ordered: 1055  Time of 1st antibiotic administration: [place] ,PharmD, BCPS Clinical Pharmacist  11/20/2021  11:17 AM

## 2021-11-20 NOTE — ED Triage Notes (Addendum)
Pt comes into the ED via EMS from home with central chest pain , worse with deep breathing and movement, nonproductive cough, sinus congestion since Sunday night.. denies fever  98.2 168/88 HR80 SR 99%RA [id number]

## 2021-11-20 NOTE — Sepsis Progress Note (Signed)
eLink monitoring code sepsis.  

## 2021-11-20 NOTE — ED Notes (Signed)
Obtained 2nd set of blood cultures now but took a while bc line pulled very slow. Lactic sent to lab on ice.

## 2021-11-20 NOTE — ED Notes (Signed)
Called lab to double checked blood work received into system and running; [place] in lab states it has been.

## 2021-11-20 NOTE — ED Provider Notes (Signed)
[facility] Emergency Department Provider Note  ____________________________________________   Event Date/Time   First MD Initiated Contact with Patient 11/20/21 [phone]     (approximate)  I have reviewed the triage vital signs and the nursing notes.   HISTORY  Chief Complaint Cough and Chest Pain    HPI Donna Fuller is a 80 y.o. female with diabetes who comes in with concerns for shortness of breath, pain with taking a deep breath, chest pain.  Patient reports symptoms started yesterday, constant, nothing makes it better or worse.  She reports a cough as well.  No known fevers.  Does report a history of pneumonia.  On review of records she had elevated troponin 2 years ago was admitted for concern for aspiration pneumonia.  Patient does report some difficulty swallowing but she states that she does not remember any event where she could have aspirated.  Patient denies any falls, hitting her head.  Denies any abdominal pain.  Denies any cardiac history.  She reports a little swelling in her legs and that is baseline for her.          Past Medical History:  Diagnosis Date   Arthritis    Breast cancer ([place]) 2007   Right breast, s/p mastectomy   Diabetes mellitus without complication (HCC)    GERD (gastroesophageal reflux disease)    Gout    Hyperlipidemia    Hypertension    Spinal stenosis     Patient Active Problem List   Diagnosis Date Noted   DDD (degenerative disc disease), lumbar 11/03/2021   Rheumatoid factor positive 07/29/2021   History of total bilateral knee replacement 07/29/2021   Other idiopathic scoliosis, thoracic region 07/08/2021   Estrogen deficiency 07/08/2021   Arthralgia of both hands 07/08/2021   Moderate nonproliferative diabetic retinopathy of left eye ([place]) 02/11/2021   Pseudophakia 02/11/2021   Primary open angle glaucoma of both eyes, moderate stage 02/11/2021   Cystoid macular edema, right eye 02/11/2021   Type 2  diabetes mellitus with hyperglycemia, without long-term current use of insulin ([place]) 12/14/2019   Type 2 diabetes mellitus with diabetic polyneuropathy, without long-term current use of insulin ([place]) 12/14/2019   Pleuritic chest pain 05/30/2019   Fever 05/30/2019   Yeast vaginitis 07/19/2017   Urinary frequency 07/19/2017   CKD (chronic kidney disease) stage 3, GFR 30-59 ml/min ([place]) 05/20/2017   Gout 05/20/2017   Hyperlipidemia 09/26/2016   Spinal stenosis 09/26/2016   History of breast cancer 09/26/2016   Diabetes mellitus type 2, controlled ([place]) 09/26/2016   GERD (gastroesophageal reflux disease) 09/26/2016   Facet arthropathy, lumbosacral 03/13/2015   Neuropathy 03/13/2015   Hypertension    Lumbar radiculopathy 01/08/2014    Past Surgical History:  Procedure Laterality Date   ABDOMINAL HYSTERECTOMY     BREAST BIOPSY Left    2016 benign    MASTECTOMY     right side   REPLACEMENT TOTAL KNEE Bilateral    TUBAL LIGATION      Prior to Admission medications   Medication Sig Start Date End Date Taking? Authorizing Provider  acetaminophen (TYLENOL) 650 MG CR tablet Take 650 mg by mouth 2 (two) times daily.    [provider]  ascorbic acid (VITAMIN C) 500 MG tablet Take 500 mg by mouth daily.    [provider]  aspirin 81 MG tablet Take 81 mg by mouth daily.    [provider]  baclofen (LIORESAL) 10 MG tablet Take 10 mg by mouth  3 (three) times daily as needed. 11/01/19   [provider]  Biotin 1000 MCG CHEW Chew by mouth.    [provider]  blood glucose meter kit and supplies KIT Dispense based on patient and insurance preference. Use up to four times daily as directed. (FOR ICD-9 250.00, 250.01). 07/09/17   Thersa Salt G, DO  brimonidine (ALPHAGAN) 0.2 % ophthalmic solution Place 1 drop into both eyes 2 (two) times a day. 05/08/19   [provider]  Cholecalciferol (VITAMIN D-3) 5000 units TABS Take 1 tablet by mouth daily.     [provider]  Dulaglutide (TRULICITY) 3 TM/2.2QJ SOPN Inject 3 mg as directed once a week. 10/16/21   Shamleffer, Melanie Crazier, MD  ferrous sulfate 325 (65 FE) MG tablet Take 325 mg by mouth daily with breakfast. Patient not taking: Reported on 11/03/2021    [provider]  furosemide (LASIX) 20 MG tablet Take 20 mg by mouth daily. Patient not taking: Reported on 11/03/2021 11/08/19   [provider]  gabapentin (NEURONTIN) 300 MG capsule TAKE 1 CAPSULE BY MOUTH THREE TIMES DAILY ,  MAY  TAKE  AN  ADDITIONAL  CAPSULE  IN  THE  EVENING Patient taking differently: Take 300 mg by mouth See admin instructions. Take 335m three times daily, may take an additional 3020min the evening. 11/20/17   SoLeone HavenMD  glipiZIDE (GLUCOTROL) 5 MG tablet Take 1 tablet (5 mg total) by mouth daily before breakfast AND 0.5 tablets (2.5 mg total) daily before supper. 10/16/21   Shamleffer, IbMelanie CrazierMD  glucose blood (ONE TOUCH ULTRA TEST) test strip USE AS DIRECTED TO CHECK BLOOD SUGAR 2-3 TIMES A DAY Pt will need an OV for more refills 10/21/21   Shamleffer, IbMelanie CrazierMD  ketorolac (ACULAR) 0.5 % ophthalmic solution INSTILL 1 DROP IN RIGHT EYE FOUR TIMES DAILY 02/11/21   Rankin, GaClent DemarkMD  latanoprost (XALATAN) 0.005 % ophthalmic solution Place 1 drop into both eyes daily. 05/05/19   [provider]  losartan (COZAAR) 50 MG tablet Take 50 mg by mouth daily. 11/08/19   [provider]  Magnesium 250 MG TABS Take 1 tablet by mouth daily.    [provider]  meloxicam (MOBIC) 7.5 MG tablet Take 1 tablet (7.5 mg total) by mouth daily as needed. 07/29/21   ScRosemarie AxMD  metoprolol succinate (TOPROL-XL) 25 MG 24 hr tablet Take 25 mg by mouth daily. 05/06/20   [provider]  montelukast (SINGULAIR) 10 MG tablet Take 10 mg by mouth at bedtime.    [provider]  omeprazole (PRILOSEC) 40 MG capsule Take 1 capsule (40 mg  total) by mouth daily. Must keep appt on 12/06/16 with Dr. SoCaryl Bis2/13/18   SoLeone HavenMD  OneTouch Delica Lancets 3333LISC Use as instructed to check blood sugar 2-3 times a day.  DX E11.9 10/21/21   Shamleffer, IbMelanie CrazierMD  pioglitazone (ACTOS) 15 MG tablet Take 1 tablet (15 mg total) by mouth daily. 10/16/21   Shamleffer, IbMelanie CrazierMD  pravastatin (PRAVACHOL) 20 MG tablet TAKE 1 TABLET BY MOUTH ONCE DAILY Patient taking differently: Take 20 mg by mouth daily. 09/27/17   SoLeone HavenMD  timolol (TIMOPTIC) 0.5 % ophthalmic solution Place 1 drop into both eyes 2 (two) times a day. 03/15/19   [provider]  triamterene-hydrochlorothiazide (MAXZIDE-25) 37.5-25 MG tablet Take 1 tablet by mouth daily. 05/29/21   [provider]  zinc gluconate 50 MG tablet Take 50 mg by mouth daily.    [provider]    Allergies Penicillins, Sulfa antibiotics, Amlodipine, and Lisinopril  Family History  Problem Relation Age of Onset   Diabetes Mother    Alzheimer's disease Mother    Hypertension Mother    Hypertension Father    Heart disease Father    Hyperlipidemia Father    Kidney disease Father    Diabetes Sister    Cancer Brother        lung   Alzheimer's disease Brother    Heart disease Brother    Hypertension Brother    Diabetes Brother    Hypertension Brother    Diabetes Brother    Hyperlipidemia Son    Hypertension Son    Diabetes Son    Hypertension Daughter    Hyperlipidemia Daughter     Social History Social History   Tobacco Use   Smoking status: Former    Years: 5.00    Types: Cigarettes   Smokeless tobacco: Never  Vaping Use   Vaping Use: Never used  Substance Use Topics   Alcohol use: No   Drug use: No      Review of Systems Constitutional: No fever/chills Eyes: No visual changes. ENT: No sore throat. Cardiovascular: Positive chest pain Respiratory: Positive for SOB, cough  Gastrointestinal: No  abdominal pain.  No nausea, no vomiting.  No diarrhea.  No constipation. Genitourinary: Negative for dysuria. Musculoskeletal: Negative for back pain. Skin: Negative for rash. Neurological: Negative for headaches, focal weakness or numbness. All other ROS negative ____________________________________________   PHYSICAL EXAM:  VITAL SIGNS: ED Triage Vitals  Enc Vitals Group     BP 11/20/21 0739 (!) 131/58     Pulse Rate 11/20/21 0739 78     Resp 11/20/21 0739 18     Temp 11/20/21 0739 98.4 F (36.9 C)     Temp Source 11/20/21 0739 Oral     SpO2 11/20/21 0739 98 %     Weight 11/20/21 0736 200 lb (90.7 kg)     Height 11/20/21 0736 '5\' 1"'  (1.549 m)     Head Circumference --      Peak Flow --      Pain Score 11/20/21 0735 10     Pain Loc --      Pain Edu? --      Excl. in [place]? --     Constitutional: Alert and oriented. Well appearing and in no acute distress. Eyes: Conjunctivae are normal. EOMI. Head: Atraumatic. Nose: No congestion/rhinnorhea. Mouth/Throat: Mucous membranes are moist.   Neck: No stridor. Trachea Midline. FROM Cardiovascular: Normal rate, regular rhythm. Grossly normal heart sounds.  Good peripheral circulation. Respiratory: Clear lungs, no increased work of breathing, coughing  Gastrointestinal: Soft and nontender. No distention. No abdominal bruits.  Musculoskeletal: No lower extremity tenderness nor edema.  No joint effusions. Neurologic:  Normal speech and language. No gross focal neurologic deficits are appreciated.  Skin:  Skin is warm, dry and intact. No rash noted. Psychiatric: Mood and affect are normal. Speech and behavior are normal. GU: Deferred   ____________________________________________   LABS (all labs ordered are listed, but only abnormal results are displayed)  Labs Reviewed  BASIC METABOLIC PANEL - Abnormal; Notable for the following components:      Result Value   Glucose, Bld 164 (*)    BUN 31 (*)    Creatinine, Ser 1.23 (*)     GFR, Estimated 45 (*)  All other components within normal limits  CBC - Abnormal; Notable for the following components:   WBC 15.3 (*)    All other components within normal limits  TROPONIN I (HIGH SENSITIVITY) - Abnormal; Notable for the following components:   Troponin I (High Sensitivity) 45 (*)    All other components within normal limits  RESP PANEL BY RT-PCR (FLU A&B, COVID) ARPGX2  TROPONIN I (HIGH SENSITIVITY)   ____________________________________________   ED ECG REPORT I, Vanessa Selmer, the attending physician, personally viewed and interpreted this ECG.  Normal sinus rate of 77, no ST elevation, T wave version in lead III, aVF and V2, normal intervals ____________________________________________  RADIOLOGY Robert Bellow, personally viewed and evaluated these images (plain radiographs) as part of my medical decision making, as well as reviewing the written report by the radiologist.  ED MD interpretation pression is bilaterally  Official radiology report(s): DG Chest 2 View  Result Date: 11/20/2021 CLINICAL DATA:  Dyspnea and chest pain EXAM: CHEST - 2 VIEW COMPARISON:  10/06/2021 chest radiograph. FINDINGS: Surgical clips overlie the right axilla. Stable cardiomediastinal silhouette with mild cardiomegaly. No pneumothorax. No pleural effusion. Patchy bibasilar lung opacities, left greater than right. No overt pulmonary edema. IMPRESSION: Patchy bibasilar lung opacities, left greater than right, which could represent aspiration, pneumonia or atelectasis. Recommend follow-up chest radiographs to resolution. Stable cardiomegaly without overt pulmonary edema. Electronically Signed   By: Ilona Sorrel M.D.   On: 11/20/2021 08:25    ____________________________________________   PROCEDURES  Procedure(s) performed (including Critical Care):  .Critical Care Performed by: Vanessa [place], MD Authorized by: Vanessa , MD   Critical care provider statement:    Critical  care time (minutes):  30   Critical care was necessary to treat or prevent imminent or life-threatening deterioration of the following conditions:  Sepsis   Critical care was time spent personally by me on the following activities:  Development of treatment plan with patient or surrogate, discussions with consultants, evaluation of patient's response to treatment, examination of patient, ordering and review of laboratory studies, ordering and review of radiographic studies, ordering and performing treatments and interventions, pulse oximetry, re-evaluation of patient's condition and review of old charts   ____________________________________________   INITIAL IMPRESSION / ASSESSMENT AND PLAN / ED COURSE   NIMRIT KEHRES was evaluated in Emergency Department on 11/20/2021 for the symptoms described in the history of present illness. She was evaluated in the context of the global COVID-19 pandemic, which necessitated consideration that the patient might be at risk for infection with the SARS-CoV-2 virus that causes COVID-19. Institutional protocols and algorithms that pertain to the evaluation of patients at risk for COVID-19 are in a state of rapid change based on information released by regulatory bodies including the CDC and federal and state organizations. These policies and algorithms were followed during the patient's care in the ED.     Pt presents with SOB. Differential includes: PNA-will get xray to evaluation Anemia-CBC to evaluate ACS- will get trops Arrhythmia-Will get EKG and keep on monitor.  COVID- will get testing per algorithm. PE-consider  Patient has a history of aspiration pneumonia and this sounds most consistent with that but given the degree of chest pain I got a CT PE to evaluate for pulmonary embolism.  This was not negative.  Her troponins are elevated and slightly increasing.  Her pain is getting better.  I am shocked that there is Pneumonia on her CT scan her white  count is elevated she is got a little bit elevated respiratory rate and her procalcitonin is significantly elevated as well.  She meets sepsis criteria.  I did start antibiotics to cover her for possible aspiration pneumonia versus community-acquired pneumonia given patient's symptoms seem consistent with that.  Given the degree of chest pain and patient's age and significantly elevated procalcitonin I feel that she should be admitted for blood culture rule out.  Patient expressed understanding felt comfortable with that plan.  Her BNP is also elevated and with the chest pain and a little bit of edema in her legs she possibly also needs an echocardiogram and monitoring of her troponins          ____________________________________________   FINAL CLINICAL IMPRESSION(S) / ED DIAGNOSES   Final diagnoses:  Chest pain, unspecified type  Sepsis, due to unspecified organism, unspecified whether acute organ dysfunction present [facility])     MEDICATIONS GIVEN DURING THIS VISIT:  Medications  lidocaine (LIDODERM) 5 % 1 patch (1 patch Transdermal Patch Applied 11/20/21 0934)  cefTRIAXone (ROCEPHIN) 2 g in sodium chloride 0.9 % 100 mL IVPB (has no administration in time range)  azithromycin (ZITHROMAX) 500 mg in sodium chloride 0.9 % 250 mL IVPB (has no administration in time range)  metroNIDAZOLE (FLAGYL) IVPB 500 mg (has no administration in time range)  acetaminophen (TYLENOL) tablet 1,000 mg (1,000 mg Oral Given 11/20/21 0934)  oxyCODONE (Oxy IR/ROXICODONE) immediate release tablet 2.5 mg (2.5 mg Oral Given 11/20/21 0949)  iohexol (OMNIPAQUE) 350 MG/ML injection 60 mL (60 mLs Intravenous Contrast Given 11/20/21 1028)     ED Discharge Orders     None        Note:  This document was prepared using Dragon voice recognition software and may include unintentional dictation errors.   Vanessa [place], MD 11/20/21 1101

## 2021-11-20 NOTE — ED Notes (Signed)
[facility] RN sent first set of blood cultures.

## 2021-11-21 ENCOUNTER — Observation Stay (HOSPITAL_BASED_OUTPATIENT_CLINIC_OR_DEPARTMENT_OTHER)
Admit: 2021-11-21 | Discharge: 2021-11-21 | Disposition: A | Discharge status: Home / Self Care | Payer: HMO | Attending: Cardiovascular Disease | Admitting: Cardiovascular Disease

## 2021-11-21 ENCOUNTER — Encounter
Admission: EM | Disposition: A | Discharge status: Home / Self Care | Payer: Self-pay | Source: Home / Self Care | Attending: Emergency Medicine

## 2021-11-21 DIAGNOSIS — I209 Angina pectoris, unspecified: Secondary | ICD-10-CM | POA: Diagnosis not present

## 2021-11-21 DIAGNOSIS — R778 Other specified abnormalities of plasma proteins: Secondary | ICD-10-CM

## 2021-11-21 DIAGNOSIS — R9431 Abnormal electrocardiogram [ECG] [EKG]: Secondary | ICD-10-CM

## 2021-11-21 DIAGNOSIS — R079 Chest pain, unspecified: Secondary | ICD-10-CM

## 2021-11-21 DIAGNOSIS — I200 Unstable angina: Secondary | ICD-10-CM

## 2021-11-21 DIAGNOSIS — I25118 Atherosclerotic heart disease of native coronary artery with other forms of angina pectoris: Secondary | ICD-10-CM | POA: Diagnosis not present

## 2021-11-21 DIAGNOSIS — I2510 Atherosclerotic heart disease of native coronary artery without angina pectoris: Secondary | ICD-10-CM

## 2021-11-21 DIAGNOSIS — I119 Hypertensive heart disease without heart failure: Secondary | ICD-10-CM

## 2021-11-21 DIAGNOSIS — E118 Type 2 diabetes mellitus with unspecified complications: Secondary | ICD-10-CM

## 2021-11-21 DIAGNOSIS — R7989 Other specified abnormal findings of blood chemistry: Secondary | ICD-10-CM

## 2021-11-21 HISTORY — PX: LEFT HEART CATH AND CORONARY ANGIOGRAPHY: CATH118249

## 2021-11-21 LAB — ECHOCARDIOGRAM COMPLETE
AR max vel: 2.69 cm2
AV Area VTI: 2.72 cm2
AV Area mean vel: 2.73 cm2
AV Mean grad: 2 mmHg
AV Peak grad: 4 mmHg
Ao pk vel: 1 m/s
Area-P 1/2: 5.79 cm2
Height: 61 in
MV VTI: 2 cm2
S' Lateral: 2.19 cm
Weight: 3200 oz

## 2021-11-21 LAB — PROCALCITONIN: Procalcitonin: 5.78 ng/mL

## 2021-11-21 LAB — CBC
HCT: 33.3 % — ABNORMAL LOW (ref 36.0–46.0)
Hemoglobin: 10.9 g/dL — ABNORMAL LOW (ref 12.0–15.0)
MCH: 29.6 pg (ref 26.0–34.0)
MCHC: 32.7 g/dL (ref 30.0–36.0)
MCV: 90.5 fL (ref 80.0–100.0)
Platelets: 179 10*3/uL (ref 150–400)
RBC: 3.68 MIL/uL — ABNORMAL LOW (ref 3.87–5.11)
RDW: 14.5 % (ref 11.5–15.5)
WBC: 10.5 10*3/uL (ref 4.0–10.5)
nRBC: 0 % (ref 0.0–0.2)

## 2021-11-21 LAB — BASIC METABOLIC PANEL
Anion gap: 5 (ref 5–15)
BUN: 29 mg/dL — ABNORMAL HIGH (ref 8–23)
CO2: 26 mmol/L (ref 22–32)
Calcium: 8.8 mg/dL — ABNORMAL LOW (ref 8.9–10.3)
Chloride: 105 mmol/L (ref 98–111)
Creatinine, Ser: 1.23 mg/dL — ABNORMAL HIGH (ref 0.44–1.00)
GFR, Estimated: 45 mL/min — ABNORMAL LOW (ref >60)
Glucose, Bld: 111 mg/dL — ABNORMAL HIGH (ref 70–99)
Potassium: 4 mmol/L (ref 3.5–5.1)
Sodium: 136 mmol/L (ref 135–145)

## 2021-11-21 LAB — TROPONIN I (HIGH SENSITIVITY)
Troponin I (High Sensitivity): 133 ng/L (ref <18)
Troponin I (High Sensitivity): 131 ng/L (ref <18)

## 2021-11-21 LAB — GLUCOSE, CAPILLARY: Glucose-Capillary: 97 mg/dL (ref 70–99)

## 2021-11-21 LAB — MAGNESIUM: Magnesium: 2.2 mg/dL (ref 1.7–2.4)

## 2021-11-21 SURGERY — LEFT HEART CATH AND CORONARY ANGIOGRAPHY
Anesthesia: Moderate Sedation

## 2021-11-21 MED ORDER — SODIUM CHLORIDE 0.9% FLUSH
3.0000 mL | Freq: Two times a day (BID) | INTRAVENOUS | Status: DC
  Administered 2021-11-21: 15:00:00 3 mL via INTRAVENOUS

## 2021-11-21 MED ORDER — VERAPAMIL HCL 2.5 MG/ML IV SOLN
INTRAVENOUS | Status: DC | PRN
  Administered 2021-11-21: 2.5 mg via INTRA_ARTERIAL

## 2021-11-21 MED ORDER — LIDOCAINE HCL 1 % IJ SOLN
INTRAMUSCULAR | Status: AC
  Filled 2021-11-21: qty 20

## 2021-11-21 MED ORDER — HEPARIN (PORCINE) IN NACL 1000-0.9 UT/500ML-% IV SOLN
INTRAVENOUS | Status: DC | PRN
  Administered 2021-11-21 (×2): 500 mL

## 2021-11-21 MED ORDER — SODIUM CHLORIDE 0.9 % IV SOLN: INTRAVENOUS | Status: AC

## 2021-11-21 MED ORDER — SODIUM CHLORIDE 0.9% FLUSH: 3.0000 mL | INTRAVENOUS | Status: DC | PRN

## 2021-11-21 MED ORDER — MIDAZOLAM HCL 2 MG/2ML IJ SOLN
INTRAMUSCULAR | Status: DC | PRN
  Administered 2021-11-21: 1 mg via INTRAVENOUS

## 2021-11-21 MED ORDER — HEPARIN SODIUM (PORCINE) 1000 UNIT/ML IJ SOLN
INTRAMUSCULAR | Status: AC
  Filled 2021-11-21: qty 10

## 2021-11-21 MED ORDER — LIDOCAINE HCL (PF) 1 % IJ SOLN
INTRAMUSCULAR | Status: DC | PRN
  Administered 2021-11-21: 2 mL

## 2021-11-21 MED ORDER — MIDAZOLAM HCL 2 MG/2ML IJ SOLN
INTRAMUSCULAR | Status: AC
  Filled 2021-11-21: qty 2

## 2021-11-21 MED ORDER — HEPARIN SODIUM (PORCINE) 1000 UNIT/ML IJ SOLN
INTRAMUSCULAR | Status: DC | PRN
  Administered 2021-11-21: 4500 [IU] via INTRAVENOUS

## 2021-11-21 MED ORDER — SODIUM CHLORIDE 0.9 % WEIGHT BASED INFUSION: 1.0000 mL/kg/h | INTRAVENOUS | Status: DC

## 2021-11-21 MED ORDER — LEVOFLOXACIN 500 MG PO TABS: 500.0000 mg | ORAL_TABLET | Freq: Every day | ORAL | 0 refills | Status: AC

## 2021-11-21 MED ORDER — IOHEXOL 300 MG/ML  SOLN
INTRAMUSCULAR | Status: DC | PRN
  Administered 2021-11-21: 12:00:00 40 mL

## 2021-11-21 MED ORDER — VERAPAMIL HCL 2.5 MG/ML IV SOLN
INTRAVENOUS | Status: AC
  Filled 2021-11-21: qty 2

## 2021-11-21 MED ORDER — SODIUM CHLORIDE 0.9 % IV SOLN: 250.0000 mL | INTRAVENOUS | Status: DC | PRN

## 2021-11-21 MED ORDER — ASPIRIN 81 MG PO CHEW: 81.0000 mg | CHEWABLE_TABLET | ORAL | Status: DC

## 2021-11-21 MED ORDER — SODIUM CHLORIDE 0.9 % WEIGHT BASED INFUSION
3.0000 mL/kg/h | INTRAVENOUS | Status: DC
  Administered 2021-11-21: 11:00:00 3 mL/kg/h via INTRAVENOUS

## 2021-11-21 MED ORDER — FENTANYL CITRATE (PF) 100 MCG/2ML IJ SOLN
INTRAMUSCULAR | Status: AC
  Filled 2021-11-21: qty 2

## 2021-11-21 MED ORDER — HEPARIN (PORCINE) IN NACL 1000-0.9 UT/500ML-% IV SOLN
INTRAVENOUS | Status: AC
  Filled 2021-11-21: qty 1000

## 2021-11-21 MED ORDER — FENTANYL CITRATE (PF) 100 MCG/2ML IJ SOLN
INTRAMUSCULAR | Status: DC | PRN
  Administered 2021-11-21: 25 ug via INTRAVENOUS

## 2021-11-21 SURGICAL SUPPLY — 10 items
CATH INFINITI 5FR JK (CATHETERS) ×2 IMPLANT
DEVICE RAD TR BAND REGULAR (VASCULAR PRODUCTS) ×2 IMPLANT
DRAPE BRACHIAL (DRAPES) ×2 IMPLANT
GLIDESHEATH SLEND SS 6F .021 (SHEATH) ×2 IMPLANT
GUIDEWIRE INQWIRE 1.5J.035X260 (WIRE) IMPLANT
INQWIRE 1.5J .035X260CM (WIRE) ×3
PACK CARDIAC CATH (CUSTOM PROCEDURE TRAY) ×3 IMPLANT
PROTECTION STATION PRESSURIZED (MISCELLANEOUS) ×3
SET ATX SIMPLICITY (MISCELLANEOUS) ×2 IMPLANT
STATION PROTECTION PRESSURIZED (MISCELLANEOUS) IMPLANT

## 2021-11-21 NOTE — Consult Note (Signed)
Cardiology Consultation:   Patient ID: Donna Fuller MRN: [id number]; DOB: Apr 20, 1941  Admit date: 11/20/2021 Date of Consult: 11/21/2021  PCP:  Elise Benne   [facility] Providers Cardiologist:  New, Dr. Brigitte Pulse   Patient Profile:   Donna Fuller is a 80 y.o. female with a hx of breast cancer s/p mastectomy, HTN, DM2 who is being seen 11/21/2021 for the evaluation of chest pain at the request of Dr. Billie Ruddy.  History of Present Illness:   [name] has no significant cardiac history. She follows with Dr. Brigitte Pulse in [place]. H/o of remote tobacco use. She reports history of cardiac catheterization 10 years ago. Said she and chest pin at that time, says they did not find any blockages. Also reports stress test over the summer that was unremarkable.   She presented to [facility] ED 12/22 for chest pain and a cough. Ohest pain started Tuesday, felt like someone was sitting on the chest. It was a sharp pain. Pain woke her up that night. Was having trouble laying down. Pain persistent over the next few days. No pain with palpitation. Pain is in the center. Pain is worse with inspiration. Not coughing anything up. Denies URI symptoms.   In the ED BP 131/58, 98% O2, pulse 78. Labs showed sodium 139, potassium 4.6, WBC 15.3, procal 1.62, HS trop 45>49>99>105>133>131. BNP 255. Hgb 12.9. CTA negative for PE, bibasilar densities, extensive coronary artery and aortic calcifications. He was started on abx and admitted.    Past Medical History:  Diagnosis Date   Arthritis    Breast cancer [facility]) 2007   Right breast, s/p mastectomy   Diabetes mellitus without complication ([place])    GERD (gastroesophageal reflux disease)    Gout    Hyperlipidemia    Hypertension    Spinal stenosis     Past Surgical History:  Procedure Laterality Date   ABDOMINAL HYSTERECTOMY     BREAST BIOPSY Left    2016 benign    MASTECTOMY     right side   REPLACEMENT TOTAL KNEE Bilateral    TUBAL LIGATION        Home Medications:  Prior to Admission medications   Medication Sig Start Date End Date Taking? Authorizing Provider  acetaminophen (TYLENOL) 650 MG CR tablet Take 650 mg by mouth 2 (two) times daily.   Yes [provider]  ascorbic acid (VITAMIN C) 500 MG tablet Take 500 mg by mouth daily.   Yes [provider]  aspirin 81 MG tablet Take 81 mg by mouth daily.   Yes [provider]  baclofen (LIORESAL) 10 MG tablet Take 10 mg by mouth 3 (three) times daily as needed. 11/01/19  Yes [provider]  Biotin 1000 MCG CHEW Chew by mouth.   Yes [provider]  brimonidine (ALPHAGAN) 0.2 % ophthalmic solution Place 1 drop into both eyes 2 (two) times a day. 05/08/19  Yes [provider]  Cholecalciferol (VITAMIN D-3) 5000 units TABS Take 1 tablet by mouth daily.   Yes [provider]  gabapentin (NEURONTIN) 300 MG capsule TAKE 1 CAPSULE BY MOUTH THREE TIMES DAILY ,  MAY  TAKE  AN  ADDITIONAL  CAPSULE  IN  THE  EVENING Patient taking differently: Take 300 mg by mouth See admin instructions. Take 379m three times daily, may take an additional 3058min the evening. 11/20/17  Yes SoLeone HavenMD  glipiZIDE (GLUCOTROL) 5 MG tablet Take 1 tablet (5 mg total) by mouth daily  before breakfast AND 0.5 tablets (2.5 mg total) daily before supper. 10/16/21  Yes Shamleffer, Melanie Crazier, MD  ketorolac (ACULAR) 0.5 % ophthalmic solution INSTILL 1 DROP IN RIGHT EYE FOUR TIMES DAILY 02/11/21  Yes Rankin, Clent Demark, MD  latanoprost (XALATAN) 0.005 % ophthalmic solution Place 1 drop into both eyes daily. 05/05/19  Yes [provider]  losartan (COZAAR) 50 MG tablet Take 50 mg by mouth daily. 11/08/19  Yes [provider]  Magnesium 250 MG TABS Take 1 tablet by mouth daily.   Yes [provider]  meloxicam (MOBIC) 7.5 MG tablet Take 1 tablet (7.5 mg total) by mouth daily as needed. 07/29/21  Yes Rosemarie Ax, MD  metoprolol  succinate (TOPROL-XL) 25 MG 24 hr tablet Take 25 mg by mouth daily. 05/06/20  Yes [provider]  montelukast (SINGULAIR) 10 MG tablet Take 10 mg by mouth at bedtime.   Yes [provider]  omeprazole (PRILOSEC) 40 MG capsule Take 1 capsule (40 mg total) by mouth daily. Must keep appt on 12/06/16 with Dr. Caryl Bis 11/11/17  Yes Leone Haven, MD  pioglitazone (ACTOS) 15 MG tablet Take 1 tablet (15 mg total) by mouth daily. 10/16/21  Yes Shamleffer, Melanie Crazier, MD  pravastatin (PRAVACHOL) 20 MG tablet TAKE 1 TABLET BY MOUTH ONCE DAILY Patient taking differently: Take 20 mg by mouth daily. 09/27/17  Yes Leone Haven, MD  timolol (TIMOPTIC) 0.5 % ophthalmic solution Place 1 drop into both eyes 2 (two) times a day. 03/15/19  Yes [provider]  triamterene-hydrochlorothiazide (MAXZIDE-25) 37.5-25 MG tablet Take 1 tablet by mouth daily. 05/29/21  Yes [provider]  zinc gluconate 50 MG tablet Take 50 mg by mouth daily.   Yes [provider]  blood glucose meter kit and supplies KIT Dispense based on patient and insurance preference. Use up to four times daily as directed. (FOR ICD-9 250.00, 250.01). 07/09/17   Thersa Salt G, DO  Dulaglutide (TRULICITY) 3 CX/4.4YJ SOPN Inject 3 mg as directed once a week. 10/16/21   Shamleffer, Melanie Crazier, MD  ferrous sulfate 325 (65 FE) MG tablet Take 325 mg by mouth daily with breakfast. Patient not taking: Reported on 11/03/2021    [provider]  furosemide (LASIX) 20 MG tablet Take 20 mg by mouth daily. Patient not taking: Reported on 11/03/2021 11/08/19   [provider]  glucose blood (ONE TOUCH ULTRA TEST) test strip USE AS DIRECTED TO CHECK BLOOD SUGAR 2-3 TIMES A DAY Pt will need an OV for more refills 10/21/21   Shamleffer, Melanie Crazier, MD  OneTouch Delica Lancets 85U MISC Use as instructed to check blood sugar 2-3 times a day.  DX E11.9 10/21/21   Shamleffer, Melanie Crazier,  MD    Inpatient Medications: Scheduled Meds:  aspirin  81 mg Oral Daily   brimonidine  1 drop Both Eyes BID   enoxaparin (LOVENOX) injection  40 mg Subcutaneous Q24H   gabapentin  300 mg Oral TID   latanoprost  1 drop Both Eyes Daily   lidocaine  1 patch Transdermal Q24H   metoprolol succinate  25 mg Oral Daily   montelukast  10 mg Oral QHS   pantoprazole  40 mg Oral Daily   pravastatin  20 mg Oral QHS   timolol  1 drop Both Eyes BID   Continuous Infusions:  azithromycin Stopped (11/20/21 1318)   cefTRIAXone (ROCEPHIN)  IV Stopped (11/20/21 1220)   PRN Meds: acetaminophen, gabapentin, guaiFENesin-dextromethorphan, ondansetron (ZOFRAN) IV,  ondansetron  Allergies:    Allergies  Allergen Reactions   Penicillins Swelling   Sulfa Antibiotics Swelling   Amlodipine Swelling    LE Swelling   Lisinopril Cough    Social History:   Social History   Socioeconomic History   Marital status: Married    Spouse name: Jimmy   Number of children: 3   Years of education: 12   Highest education level: Not on file  Occupational History   Not on file  Tobacco Use   Smoking status: Former    Years: 5.00    Types: Cigarettes   Smokeless tobacco: Never  Vaping Use   Vaping Use: Never used  Substance and Sexual Activity   Alcohol use: No   Drug use: No   Sexual activity: Not Currently  Other Topics Concern   Not on file  Social History Narrative   Marital Status:  Married Manufacturing systems engineer)    Children:  Daughter(1) Son (1)    Pets:  None    Living Situation: Lives with husband and daughter.     Occupation:  Retired Licensed conveyancer)    Education: 12th Grade    Tobacco Use/Exposure:  She used to smoke socially during the weekends but quit 40 years ago.    Alcohol Use:  Occasional   Drug Use:  None   Diet:  Regular   Exercise:  None   Hobbies:  Reading and playing volleyball             Social Determinants of Health   Financial Resource Strain: Low Risk    Difficulty of Paying Living  Expenses: Not hard at all  Food Insecurity: No Food Insecurity   Worried About Charity fundraiser in the Last Year: Never true   Ran Out of Food in the Last Year: Never true  Transportation Needs: No Transportation Needs   Lack of Transportation (Medical): No   Lack of Transportation (Non-Medical): No  Physical Activity: Inactive   Days of Exercise per Week: 0 days   Minutes of Exercise per Session: 0 min  Stress: No Stress Concern Present   Feeling of Stress : Not at all  Social Connections: Moderately Integrated   Frequency of Communication with Friends and Family: More than three times a week   Frequency of Social Gatherings with Friends and Family: More than three times a week   Attends Religious Services: More than 4 times per year   Active Member of [employer] or Organizations: No   Attends Music therapist: Never   Marital Status: Married  Human resources officer Violence: Not At Risk   Fear of Current or Ex-Partner: No   Emotionally Abused: No   Physically Abused: No   Sexually Abused: No    Family History:    Family History  Problem Relation Age of Onset   Diabetes Mother    Alzheimer's disease Mother    Hypertension Mother    Hypertension Father    Heart disease Father    Hyperlipidemia Father    Kidney disease Father    Diabetes Sister    Cancer Brother        lung   Alzheimer's disease Brother    Heart disease Brother    Hypertension Brother    Diabetes Brother    Hypertension Brother    Diabetes Brother    Hyperlipidemia Son    Hypertension Son    Diabetes Son    Hypertension Daughter    Hyperlipidemia Daughter  ROS:  Please see the history of present illness.   All other ROS reviewed and negative.     Physical Exam/Data:   Vitals:   11/20/21 1900 11/20/21 2043 11/21/21 0428 11/21/21 0735  BP: 125/64 (!) 132/58 122/65 130/62  Pulse: 86 85 83 85  Resp: (!) _0 Temp:  98.9 F (37.2 C) 98.3 F (36.8 C) 100.2 F (37.9 C)   TempSrc:  Oral    SpO2: 98% 100% 99% 98%  Weight:      Height:       No intake or output data in the 24 hours ending 11/21/21 0813 Last 3 Weights 11/20/2021 11/03/2021 10/16/2021  Weight (lbs) 200 lb 212 lb 6.4 oz 212 lb  Weight (kg) 90.719 kg 96.344 kg 96.163 kg     Body mass index is 37.79 kg/m.  General:  Well nourished, well developed, in no acute distress HEENT: normal Neck: no JVD Vascular: No carotid bruits; Distal pulses 2+ bilaterally Cardiac:  normal S1, S2; RRR; no murmur  Lungs:  clear to auscultation bilaterally, no wheezing, rhonchi or rales  Abd: soft, nontender, no hepatomegaly  Ext: no edema Musculoskeletal:  No deformities, BUE and BLE strength normal and equal Skin: warm and Donna Fuller  Neuro:  CNs 2-12 intact, no focal abnormalities noted Psych:  Normal affect   EKG:  The EKG was personally reviewed and demonstrates:  NSR, 77bpm, TWI III, aVF Telemetry:  Telemetry was personally reviewed and demonstrates:  NSR, PVC, HR 80s  Relevant CV Studies:  Echo ordered  Echo 2020  1. The left ventricle has normal systolic function with an ejection  fraction of 60-65%. The cavity size was normal. Left ventricular diastolic  Doppler parameters are consistent with impaired relaxation. No evidence of  left ventricular regional wall  motion abnormalities.   2. The right ventricle has normal systolic function. The cavity was  normal. There is no increase in right ventricular wall thickness.   3. Mild calcification of the mitral valve leaflet. No evidence of mitral  valve stenosis. Trivial mitral regurgitation.   4. The aortic valve is tricuspid. No stenosis of the aortic valve.   5. The aortic root is normal in size and structure.   6. Normal IVC size. No complete TR doppler jet so unable to estimate PA  systolic pressure.   Laboratory Data:  High Sensitivity Troponin:   Recent Labs  Lab 11/20/21 0926 11/20/21 1651 11/20/21 2137 11/21/21 0055 11/21/21 0449   TROPONINIHS 49* 99* 105* 133* 131*     Chemistry Recent Labs  Lab 11/20/21 0741 11/21/21 0055  NA 139 136  K 4.6 4.0  CL 105 105  CO2 27 26  GLUCOSE 164* 111*  BUN 31* 29*  CREATININE 1.23* 1.23*  CALCIUM 9.5 8.8*  MG  --  2.2  GFRNONAA 45* 45*  ANIONGAP 7 5    No results for input(s): PROT, ALBUMIN, AST, ALT, ALKPHOS, BILITOT in the last 168 hours. Lipids No results for input(s): CHOL, TRIG, HDL, LABVLDL, LDLCALC, CHOLHDL in the last 168 hours.  Hematology Recent Labs  Lab 11/20/21 0741 11/21/21 0055  WBC 15.3* 10.5  RBC 4.28 3.68*  HGB 12.9 10.9*  HCT 39.9 33.3*  MCV 93.2 90.5  MCH 30.1 29.6  MCHC 32.3 32.7  RDW 14.4 14.5  PLT 196 179   Thyroid No results for input(s): TSH, FREET4 in the last 168 hours.  BNP Recent Labs  Lab 11/20/21 0926  BNP 255.8*  DDimer No results for input(s): DDIMER in the last 168 hours.   Radiology/Studies:  DG Chest 2 View  Result Date: 11/20/2021 CLINICAL DATA:  Dyspnea and chest pain EXAM: CHEST - 2 VIEW COMPARISON:  10/06/2021 chest radiograph. FINDINGS: Surgical clips overlie the right axilla. Stable cardiomediastinal silhouette with mild cardiomegaly. No pneumothorax. No pleural effusion. Patchy bibasilar lung opacities, left greater than right. No overt pulmonary edema. IMPRESSION: Patchy bibasilar lung opacities, left greater than right, which could represent aspiration, pneumonia or atelectasis. Recommend follow-up chest radiographs to resolution. Stable cardiomegaly without overt pulmonary edema. Electronically Signed   By: Ilona Sorrel M.D.   On: 11/20/2021 08:25   CT Angio Chest PE W and/or Wo Contrast  Result Date: 11/20/2021 CLINICAL DATA:  Shortness of breath, cough EXAM: CT ANGIOGRAPHY CHEST WITH CONTRAST TECHNIQUE: Multidetector CT imaging of the chest was performed using the standard protocol during bolus administration of intravenous contrast. Multiplanar CT image reconstructions and MIPs were obtained to  evaluate the vascular anatomy. CONTRAST:  66m OMNIPAQUE IOHEXOL 350 MG/ML SOLN COMPARISON:  05/30/2019 FINDINGS: Cardiovascular: Cardiomegaly. Extensive coronary artery and aortic calcifications. No aneurysm. No filling defects in the pulmonary arteries to suggest pulmonary emboli. Mediastinum/Nodes: No mediastinal, hilar, or axillary adenopathy. Trachea and esophagus are unremarkable. Thyroid unremarkable. Lungs/Pleura: Minimal bibasilar densities, likely atelectasis. No other confluent opacities or effusions. Upper Abdomen: Imaging into the upper abdomen demonstrates no acute findings. Musculoskeletal: Chest wall soft tissues are unremarkable. Prior right mastectomy. No acute bony abnormality. Review of the MIP images confirms the above findings. IMPRESSION: No evidence of pulmonary embolus. Cardiomegaly, diffuse coronary artery disease. Bibasilar atelectasis. Aortic Atherosclerosis (ICD10-I70.0). Electronically Signed   By: KRolm BaptiseM.D.   On: 11/20/2021 10:39     Assessment and Plan:   Elevated troponin Coronary artery calcifications - patient admitted for chest pain and cough. Chest pain with typical and atypical symptoms, found to have possible PNA, elevated troponin and coronary artery calcifications - HS trop peak 133 - EKG with TWI III and aVF, re-check EKG - check echo - chest pain better today - continue ASA 82mdaily - continue PTA Toprol 2552maily - Patient reports normal cardiac cath 10 years ago for chest pain. Also reports normal stress test over the summer that was unremarkable.  - LHC vs stress test discussed with patient, unsure if she wants to pursue testing at this point.  Possible PNA - WBC elevated and imaging with possible PNA - started on abx - chest pain   HTN - continue PTA Losartan, Lopressor - Bps good  HLD - continue Pravastatin, may need high intensity statin - LDL 77 in 2020 - re-check lipid panel  DM2 - SSI per Im  For questions or updates,  please contact [email or web address] Please consult [email or web address] for contact info under    Signed, Enzio Buchler H FNinfa MeekerA-C  11/21/2021 8:13 AM

## 2021-11-21 NOTE — Discharge Summary (Addendum)
Donna Fuller OHY:[id number] DOB: August 23, 1941 DOA: 11/20/2021  PCP: Donna Pai, PA-C  Admit date: 11/20/2021 Discharge date: 11/21/21  Admitted From: home Disposition:  home  Recommendations for Outpatient Follow-up:  Follow up with PCP in 1 week Please obtain BMP/CBC in one week      Discharge Condition:Stable CODE STATUS: Full Diet recommendation: Heart Healthy Brief/Interim Summary: Per Donna Fuller:Donna Fuller Donna Fuller is a 80 y.o. female with medical history significant of breast cancer s/p mastectomy, HTN, DM2 who presented with chest pain and cough.  EKG with nonischemic ST changes.  CTA negative for PE.  Please see full result below.  Cardiology was consulted.  Patient was taken to cardiac catheterization and was not found with any obstructive CAD.  This was a noncardiac chest pain.  After patient received post protocol cath IV fluids she was discharged home.  She was treated for possible pneumonia due to leukocytosis and procalcitonin being elevated.  She was stable for discharge.  She was instructed to follow-up with primary care next week for blood work To evaluate her kidney function  Discharge Diagnoses:  Principal Problem:   Angina pectoris ([place]) Active Problems:   Diabetes mellitus type 2 with complications ([place])   CAD (coronary artery disease)   LV hypertrophy, hypertensive   Elevated troponin    Discharge Instructions  Discharge Instructions     Call MD for:  temperature >100.4   Complete by: As directed    Diet - low sodium heart healthy   Complete by: As directed    Discharge instructions   Complete by: As directed    Take losartan in 2 days (12/25) F/u next week with pcp for labs   Increase activity slowly   Complete by: As directed       Allergies as of 11/21/2021       Reactions   Penicillins Swelling   Sulfa Antibiotics Swelling   Amlodipine Swelling   LE Swelling   Lisinopril Cough        Medication List     STOP taking these  medications    baclofen 10 MG tablet Commonly known as: LIORESAL   ferrous sulfate 325 (65 FE) MG tablet   furosemide 20 MG tablet Commonly known as: LASIX   meloxicam 7.5 MG tablet Commonly known as: Mobic   triamterene-hydrochlorothiazide 37.5-25 MG tablet Commonly known as: MAXZIDE-25       TAKE these medications    acetaminophen 650 MG CR tablet Commonly known as: TYLENOL Take 650 mg by mouth 2 (two) times daily.   ascorbic acid 500 MG tablet Commonly known as: VITAMIN C Take 500 mg by mouth daily.   aspirin 81 MG tablet Take 81 mg by mouth daily.   Biotin 1000 MCG Chew Chew by mouth.   blood glucose meter kit and supplies Kit Dispense based on patient and insurance preference. Use up to four times daily as directed. (FOR ICD-9 250.00, 250.01).   brimonidine 0.2 % ophthalmic solution Commonly known as: ALPHAGAN Place 1 drop into both eyes 2 (two) times a day.   gabapentin 300 MG capsule Commonly known as: NEURONTIN TAKE 1 CAPSULE BY MOUTH THREE TIMES DAILY ,  MAY  TAKE  AN  ADDITIONAL  CAPSULE  IN  THE  EVENING What changed: See the new instructions.   glipiZIDE 5 MG tablet Commonly known as: GLUCOTROL Take 1 tablet (5 mg total) by mouth daily before breakfast AND 0.5 tablets (2.5 mg total) daily before supper.   glucose blood test strip  Commonly known as: ONE TOUCH ULTRA TEST USE AS DIRECTED TO CHECK BLOOD SUGAR 2-3 TIMES A DAY Pt will need an OV for more refills   ketorolac 0.5 % ophthalmic solution Commonly known as: ACULAR INSTILL 1 DROP IN RIGHT EYE FOUR TIMES DAILY   latanoprost 0.005 % ophthalmic solution Commonly known as: XALATAN Place 1 drop into both eyes daily.   levofloxacin 500 MG tablet Commonly known as: LEVAQUIN Take 1 tablet (500 mg total) by mouth daily for 3 days.   losartan 50 MG tablet Commonly known as: COZAAR Take 50 mg by mouth daily.   Magnesium 250 MG Tabs Take 1 tablet by mouth daily.   metoprolol succinate 25  MG 24 hr tablet Commonly known as: TOPROL-XL Take 25 mg by mouth daily.   montelukast 10 MG tablet Commonly known as: SINGULAIR Take 10 mg by mouth at bedtime.   omeprazole 40 MG capsule Commonly known as: PRILOSEC Take 1 capsule (40 mg total) by mouth daily. Must keep appt on 12/06/16 with Donna Fuller Lancets 16X Misc Use as instructed to check blood sugar 2-3 times a day.  DX E11.9   pioglitazone 15 MG tablet Commonly known as: ACTOS Take 1 tablet (15 mg total) by mouth daily.   pravastatin 20 MG tablet Commonly known as: PRAVACHOL TAKE 1 TABLET BY MOUTH ONCE DAILY   timolol 0.5 % ophthalmic solution Commonly known as: TIMOPTIC Place 1 drop into both eyes 2 (two) times a day.   Trulicity 3 WR/6.0AV Sopn Generic drug: Dulaglutide Inject 3 mg as directed once a week.   Vitamin D-3 125 MCG (5000 UT) Tabs Take 1 tablet by mouth daily.   zinc gluconate 50 MG tablet Take 50 mg by mouth daily.        Follow-up Information     Saguier, Percell Miller, PA-C Follow up in 1 week(s).   Specialties: Internal Medicine, Family Medicine Contact information: [address] [place] [address] [phone]                Allergies  Allergen Reactions   Penicillins Swelling   Sulfa Antibiotics Swelling   Amlodipine Swelling    LE Swelling   Lisinopril Cough    Consultations: Cardiology   Procedures/Studies: DG Chest 2 View  Result Date: 11/20/2021 CLINICAL DATA:  Dyspnea and chest pain EXAM: CHEST - 2 VIEW COMPARISON:  10/06/2021 chest radiograph. FINDINGS: Surgical clips overlie the right axilla. Stable cardiomediastinal silhouette with mild cardiomegaly. No pneumothorax. No pleural effusion. Patchy bibasilar lung opacities, left greater than right. No overt pulmonary edema. IMPRESSION: Patchy bibasilar lung opacities, left greater than right, which could represent aspiration, pneumonia or atelectasis. Recommend follow-up chest  radiographs to resolution. Stable cardiomegaly without overt pulmonary edema. Electronically Signed   By: Donna Fuller M.D.   On: 11/20/2021 08:25   CT Angio Chest PE W and/or Wo Contrast  Result Date: 11/20/2021 CLINICAL DATA:  Shortness of breath, cough EXAM: CT ANGIOGRAPHY CHEST WITH CONTRAST TECHNIQUE: Multidetector CT imaging of the chest was performed using the standard protocol during bolus administration of intravenous contrast. Multiplanar CT image reconstructions and MIPs were obtained to evaluate the vascular anatomy. CONTRAST:  46m OMNIPAQUE IOHEXOL 350 MG/ML SOLN COMPARISON:  05/30/2019 FINDINGS: Cardiovascular: Cardiomegaly. Extensive coronary artery and aortic calcifications. No aneurysm. No filling defects in the pulmonary arteries to suggest pulmonary emboli. Mediastinum/Nodes: No mediastinal, hilar, or axillary adenopathy. Trachea and esophagus are unremarkable. Thyroid unremarkable. Lungs/Pleura: Minimal bibasilar densities,  likely atelectasis. No other confluent opacities or effusions. Upper Abdomen: Imaging into the upper abdomen demonstrates no acute findings. Musculoskeletal: Chest wall soft tissues are unremarkable. Prior right mastectomy. No acute bony abnormality. Review of the MIP images confirms the above findings. IMPRESSION: No evidence of pulmonary embolus. Cardiomegaly, diffuse coronary artery disease. Bibasilar atelectasis. Aortic Atherosclerosis (ICD10-I70.0). Electronically Signed   By: Rolm Baptise M.D.   On: 11/20/2021 10:39   CARDIAC CATHETERIZATION  Result Date: 11/21/2021   Mid LAD lesion is 20% stenosed.   LV end diastolic pressure is mildly elevated. 1.  Mild nonobstructive coronary artery disease. 2.  Left ventricular angiography was not performed.  EF was normal by echo. 3.  Mildly elevated left ventricular end-diastolic pressure at 18 mmHg. Recommendations: No culprit is identified for patient's symptoms and mildly elevated troponin.   ECHOCARDIOGRAM  COMPLETE  Result Date: 11/21/2021    ECHOCARDIOGRAM REPORT   Patient Name:   Donna Fuller Date of Exam: 11/21/2021 Medical Rec #:  [id number]        Height:       61.0 in Accession #:    [id number]       Weight:       200.0 lb Date of Birth:  31-Jul-1941        BSA:          1.889 m Patient Age:    65 years         BP:           130/62 mmHg Patient Gender: F                HR:           81 bpm. Exam Location:  [facility] Procedure: 2D Echo, Color Doppler, Cardiac Doppler and Strain Analysis Indications:     R07.9 Chest Pain  History:         Patient has prior history of Echocardiogram examinations. Risk                  Factors:Hypertension, Diabetes and Dyslipidemia.  Sonographer:     Charmayne Sheer Referring Phys:  [place] Diagnosing Phys: Ida Rogue MD  Sonographer Comments: Global longitudinal strain was attempted. IMPRESSIONS  1. Left ventricular ejection fraction, by estimation, is 60 to 65%. The left ventricle has normal function. The left ventricle has no regional wall motion abnormalities. There is moderate left ventricular hypertrophy. Left ventricular diastolic parameters are consistent with Grade II diastolic dysfunction (pseudonormalization). The average left ventricular global longitudinal strain is -13.6 %. The global longitudinal strain is abnormal.  2. Right ventricular systolic function is normal. The right ventricular size is normal. Tricuspid regurgitation signal is inadequate for assessing PA pressure.  3. The mitral valve is normal in structure. Trivial mitral valve regurgitation. No evidence of mitral stenosis.  4. The aortic valve is tricuspid. Aortic valve regurgitation is not visualized. No aortic stenosis is present.  5. The inferior vena cava is normal in size with greater than 50% respiratory variability, suggesting right atrial pressure of 3 mmHg. FINDINGS  Left Ventricle: Left ventricular ejection fraction, by estimation, is 60 to 65%. The left ventricle has normal  function. The left ventricle has no regional wall motion abnormalities. The average left ventricular global longitudinal strain is -13.6 %. The global longitudinal strain is abnormal. The left ventricular internal cavity size was normal in size. There is moderate left ventricular hypertrophy. Left ventricular diastolic parameters are consistent with Grade II diastolic dysfunction (pseudonormalization).  Right Ventricle: The right ventricular size is normal. No increase in right ventricular wall thickness. Right ventricular systolic function is normal. Tricuspid regurgitation signal is inadequate for assessing PA pressure. Left Atrium: Left atrial size was normal in size. Right Atrium: Right atrial size was normal in size. Pericardium: There is no evidence of pericardial effusion. Mitral Valve: The mitral valve is normal in structure. There is mild calcification of the mitral valve leaflet(s). Trivial mitral valve regurgitation. No evidence of mitral valve stenosis. MV peak gradient, 4.2 mmHg. The mean mitral valve gradient is 2.0  mmHg. Tricuspid Valve: The tricuspid valve is normal in structure. Tricuspid valve regurgitation is not demonstrated. No evidence of tricuspid stenosis. Aortic Valve: The aortic valve is tricuspid. Aortic valve regurgitation is not visualized. No aortic stenosis is present. Aortic valve mean gradient measures 2.0 mmHg. Aortic valve peak gradient measures 4.0 mmHg. Aortic valve area, by VTI measures 2.72 cm. Pulmonic Valve: The pulmonic valve was normal in structure. Pulmonic valve regurgitation is not visualized. No evidence of pulmonic stenosis. Aorta: The aortic root is normal in size and structure. Venous: The inferior vena cava is normal in size with greater than 50% respiratory variability, suggesting right atrial pressure of 3 mmHg. IAS/Shunts: No atrial level shunt detected by color flow Doppler.  LEFT VENTRICLE PLAX 2D LVIDd:         3.57 cm   Diastology LVIDs:         2.19 cm   LV  e' medial:    6.53 cm/s LV PW:         1.32 cm   LV E/e' medial:  17.2 LV IVS:        1.16 cm   LV e' lateral:   6.31 cm/s LVOT diam:     1.90 cm   LV E/e' lateral: 17.7 LV SV:         52 LV SV Index:   28        2D Longitudinal Strain LVOT Area:     2.84 cm  2D Strain GLS Avg:     -13.6 %  RIGHT VENTRICLE RV Basal diam:  2.53 cm LEFT ATRIUM             Index        RIGHT ATRIUM           Index LA diam:        3.90 cm 2.06 cm/m   RA Area:     10.10 cm LA Vol (A2C):   36.5 ml 19.32 ml/m  RA Volume:   16.80 ml  8.89 ml/m LA Vol (A4C):   46.0 ml 24.35 ml/m LA Biplane Vol: 44.4 ml 23.51 ml/m  AORTIC VALVE                    PULMONIC VALVE AV Area (Vmax):    2.69 cm     PV Vmax:       0.78 m/s AV Area (Vmean):   2.73 cm     PV Vmean:      53.300 cm/s AV Area (VTI):     2.72 cm     PV VTI:        0.146 m AV Vmax:           99.80 cm/s   PV Peak grad:  2.4 mmHg AV Vmean:          72.000 cm/s  PV Mean grad:  1.0 mmHg AV VTI:  0.192 m AV Peak Grad:      4.0 mmHg AV Mean Grad:      2.0 mmHg LVOT Vmax:         94.60 cm/s LVOT Vmean:        69.300 cm/s LVOT VTI:          0.184 m LVOT/AV VTI ratio: 0.96  AORTA Ao Root diam: 3.30 cm MITRAL VALVE MV Area (PHT): 5.79 cm     SHUNTS MV Area VTI:   2.00 cm     Systemic VTI:  0.18 m MV Peak grad:  4.2 mmHg     Systemic Diam: 1.90 cm MV Mean grad:  2.0 mmHg MV Vmax:       1.03 m/s MV Vmean:      76.1 cm/s MV Decel Time: 131 msec MV E velocity: 112.00 cm/s MV A velocity: 99.00 cm/s MV E/A ratio:  1.13 Ida Rogue MD Electronically signed by Ida Rogue MD Signature Date/Time: 11/21/2021/10:55:11 AM    Final    XR HIP UNILAT W OR W/O PELVIS 2-3 VIEWS LEFT  Result Date: 11/03/2021 No hip joint narrowing was noted.No erosive changes were noted.  No chondrocalcinosis was noted.  Degenerative changes were noted in the lumbar spine. Impression: Unremarkable x-ray of the hip joint.  XR Hand 2 View Left  Result Date: 11/03/2021 CMC, PIP and DIP narrowing was  noted.  No juxta-articular osteopenia was noted.  Cystic changes were noted in the base of the second third and fifth phalanges.  No MCP, intercarpal or radiocarpal joint space narrowing was noted. Impression: These findings are consistent with osteoarthritis.  Cystic changes were noted in the MCP joints which could be consistent with inflammatory arthritis.  XR Hand 2 View Right  Result Date: 11/03/2021 CMC, PIP and DIP narrowing was noted.  No MCP, intercarpal or radiocarpal joint space narrowing was noted.  A cystic versus erosive change was noted at the base of the fifth phalanx.  No juxta-articular osteopenia was noted. Impression: These findings are consistent with osteoarthritis.  The cystic versus erosive change at the base of the fifth phalanx raises the concern about inflammatory arthritis.     Subjective: No shortness of breath or chest pain  Discharge Exam: Vitals:   11/21/21 1430 11/21/21 1457  BP: (!) 105/55 109/60  Pulse: 90 87  Resp: 20 16  Temp:  98 F (36.7 C)  SpO2: 99% 98%   Vitals:   11/21/21 1330 11/21/21 1400 11/21/21 1430 11/21/21 1457  BP: 105/90 (!) 105/55 (!) 105/55 109/60  Pulse: 79 89 90 87  Resp: '18 20 20 16  ' Temp:    98 F (36.7 C)  TempSrc:      SpO2: 97% 99% 99% 98%  Weight:      Height:        General: Pt is alert, awake, not in acute distress Cardiovascular: RRR, S1/S2 +, no rubs, no gallops Respiratory: CTA bilaterally, no wheezing, no rhonchi Abdominal: Soft, NT, ND, bowel sounds + Extremities: no edema, no cyanosis    The results of significant diagnostics from this hospitalization (including imaging, microbiology, ancillary and laboratory) are listed below for reference.     Microbiology: Recent Results (from the past 240 hour(s))  Resp Panel by RT-PCR (Flu A&B, Covid) Nasopharyngeal Swab     Status: None   Collection Time: 11/20/21  7:41 AM   Specimen: Nasopharyngeal Swab; Nasopharyngeal(NP) swabs in vial transport medium   Result Value Ref Range Status   SARS Coronavirus  2 by RT PCR NEGATIVE NEGATIVE Final    Comment: (NOTE) SARS-CoV-2 target nucleic acids are NOT DETECTED.  The SARS-CoV-2 RNA is generally detectable in upper respiratory specimens during the acute phase of infection. The lowest concentration of SARS-CoV-2 viral copies this assay can detect is 138 copies/mL. A negative result does not preclude SARS-Cov-2 infection and should not be used as the sole basis for treatment or other patient management decisions. A negative result may occur with  improper specimen collection/handling, submission of specimen other than nasopharyngeal swab, presence of viral mutation(s) within the areas targeted by this assay, and inadequate number of viral copies(<138 copies/mL). A negative result must be combined with clinical observations, patient history, and epidemiological information. The expected result is Negative.  Fact Sheet for Patients:  EntrepreneurPulse.com.au  Fact Sheet for Healthcare Providers:  IncredibleEmployment.be  This test is no Donna yet approved or cleared by the [place] FDA and  has been authorized for detection and/or diagnosis of SARS-CoV-2 by FDA under an Emergency Use Authorization (EUA). This EUA will remain  in effect (meaning this test can be used) for the duration of the COVID-19 declaration under Section 564(b)(1) of the Act, 21 U.S.C.section 360bbb-3(b)(1), unless the authorization is terminated  or revoked sooner.       Influenza A by PCR NEGATIVE NEGATIVE Final   Influenza B by PCR NEGATIVE NEGATIVE Final    Comment: (NOTE) The Xpert Xpress SARS-CoV-2/FLU/RSV plus assay is intended as an aid in the diagnosis of influenza from Nasopharyngeal swab specimens and should not be used as a sole basis for treatment. Nasal washings and aspirates are unacceptable for Xpert Xpress SARS-CoV-2/FLU/RSV testing.  Fact Sheet for  Patients: EntrepreneurPulse.com.au  Fact Sheet for Healthcare Providers: IncredibleEmployment.be  This test is not yet approved or cleared by the [place] FDA and has been authorized for detection and/or diagnosis of SARS-CoV-2 by FDA under an Emergency Use Authorization (EUA). This EUA will remain in effect (meaning this test can be used) for the duration of the COVID-19 declaration under Section 564(b)(1) of the Act, 21 U.S.C. section 360bbb-3(b)(1), unless the authorization is terminated or revoked.  Performed at [facility], [address] [place], [place] [address]   Blood culture (routine x 2)     Status: None (Preliminary result)   Collection Time: 11/20/21  9:36 AM   Specimen: BLOOD  Result Value Ref Range Status   Specimen Description BLOOD RIGHT ANTECUBITAL  Final   Special Requests   Final    BOTTLES DRAWN AEROBIC AND ANAEROBIC Blood Culture adequate volume   Culture   Final    NO GROWTH 2 DAYS Performed at [facility], [address], [place], [place] [address]    Report Status PENDING  Incomplete  Blood culture (routine x 2)     Status: None (Preliminary result)   Collection Time: 11/20/21 11:30 AM   Specimen: BLOOD  Result Value Ref Range Status   Specimen Description BLOOD BLOOD RIGHT HAND  Final   Special Requests   Final    BOTTLES DRAWN AEROBIC AND ANAEROBIC Blood Culture results may not be optimal due to an inadequate volume of blood received in culture bottles   Culture   Final    NO GROWTH 2 DAYS Performed at [facility], [address] [place], [place] [address]    Report Status PENDING  Incomplete     Labs: BNP (last 3 results) Recent Labs    11/20/21 0926  BNP 255.8*  Basic Metabolic Panel: Recent Labs  Lab 11/20/21 0741 11/21/21 0055  NA 139 136  K 4.6 4.0  CL 105 105  CO2 27 26  GLUCOSE 164* 111*  BUN 31* 29*  CREATININE 1.23* 1.23*  CALCIUM 9.5 8.8*  MG   --  2.2   Liver Function Tests: No results for input(s): AST, ALT, ALKPHOS, BILITOT, PROT, ALBUMIN in the last 168 hours. No results for input(s): LIPASE, AMYLASE in the last 168 hours. No results for input(s): AMMONIA in the last 168 hours. CBC: Recent Labs  Lab 11/20/21 0741 11/21/21 0055  WBC 15.3* 10.5  HGB 12.9 10.9*  HCT 39.9 33.3*  MCV 93.2 90.5  PLT 196 179   Cardiac Enzymes: No results for input(s): CKTOTAL, CKMB, CKMBINDEX, TROPONINI in the last 168 hours. BNP: Invalid input(s): POCBNP CBG: Recent Labs  Lab 11/21/21 1119  GLUCAP 97   D-Dimer No results for input(s): DDIMER in the last 72 hours. Hgb A1c No results for input(s): HGBA1C in the last 72 hours. Lipid Profile No results for input(s): CHOL, HDL, LDLCALC, TRIG, CHOLHDL, LDLDIRECT in the last 72 hours. Thyroid function studies No results for input(s): TSH, T4TOTAL, T3FREE, THYROIDAB in the last 72 hours.  Invalid input(s): FREET3 Anemia work up No results for input(s): VITAMINB12, FOLATE, FERRITIN, TIBC, IRON, RETICCTPCT in the last 72 hours. Urinalysis    Component Value Date/Time   COLORURINE YELLOW 05/31/2019 1632   APPEARANCEUR CLOUDY (A) 05/31/2019 1632   LABSPEC 1.023 05/31/2019 1632   PHURINE 5.0 05/31/2019 1632   GLUCOSEU NEGATIVE 05/31/2019 1632   HGBUR MODERATE (A) 05/31/2019 1632   BILIRUBINUR NEGATIVE 05/31/2019 1632   BILIRUBINUR negative 07/19/2017 1429   KETONESUR 5 (A) 05/31/2019 1632   PROTEINUR Negative 03/11/2020 1047   PROTEINUR 100 (A) 05/31/2019 1632   UROBILINOGEN 0.2 07/19/2017 1429   UROBILINOGEN 0.2 04/19/2015 1215   NITRITE negative 03/11/2020 1047   NITRITE NEGATIVE 05/31/2019 1632   LEUKOCYTESUR Negative 03/11/2020 1047   LEUKOCYTESUR NEGATIVE 05/31/2019 1632   Sepsis Labs Invalid input(s): PROCALCITONIN,  WBC,  LACTICIDVEN Microbiology Recent Results (from the past 240 hour(s))  Resp Panel by RT-PCR (Flu A&B, Covid) Nasopharyngeal Swab     Status: None    Collection Time: 11/20/21  7:41 AM   Specimen: Nasopharyngeal Swab; Nasopharyngeal(NP) swabs in vial transport medium  Result Value Ref Range Status   SARS Coronavirus 2 by RT PCR NEGATIVE NEGATIVE Final    Comment: (NOTE) SARS-CoV-2 target nucleic acids are NOT DETECTED.  The SARS-CoV-2 RNA is generally detectable in upper respiratory specimens during the acute phase of infection. The lowest concentration of SARS-CoV-2 viral copies this assay can detect is 138 copies/mL. A negative result does not preclude SARS-Cov-2 infection and should not be used as the sole basis for treatment or other patient management decisions. A negative result may occur with  improper specimen collection/handling, submission of specimen other than nasopharyngeal swab, presence of viral mutation(s) within the areas targeted by this assay, and inadequate number of viral copies(<138 copies/mL). A negative result must be combined with clinical observations, patient history, and epidemiological information. The expected result is Negative.  Fact Sheet for Patients:  EntrepreneurPulse.com.au  Fact Sheet for Healthcare Providers:  IncredibleEmployment.be  This test is no Donna yet approved or cleared by the [place] FDA and  has been authorized for detection and/or diagnosis of SARS-CoV-2 by FDA under an Emergency Use Authorization (EUA). This EUA will remain  in effect (meaning this test can be used) for  the duration of the COVID-19 declaration under Section 564(b)(1) of the Act, 21 U.S.C.section 360bbb-3(b)(1), unless the authorization is terminated  or revoked sooner.       Influenza A by PCR NEGATIVE NEGATIVE Final   Influenza B by PCR NEGATIVE NEGATIVE Final    Comment: (NOTE) The Xpert Xpress SARS-CoV-2/FLU/RSV plus assay is intended as an aid in the diagnosis of influenza from Nasopharyngeal swab specimens and should not be used as a sole basis for treatment.  Nasal washings and aspirates are unacceptable for Xpert Xpress SARS-CoV-2/FLU/RSV testing.  Fact Sheet for Patients: EntrepreneurPulse.com.au  Fact Sheet for Healthcare Providers: IncredibleEmployment.be  This test is not yet approved or cleared by the [place] FDA and has been authorized for detection and/or diagnosis of SARS-CoV-2 by FDA under an Emergency Use Authorization (EUA). This EUA will remain in effect (meaning this test can be used) for the duration of the COVID-19 declaration under Section 564(b)(1) of the Act, 21 U.S.C. section 360bbb-3(b)(1), unless the authorization is terminated or revoked.  Performed at [facility], [address] [place], [place] [address]   Blood culture (routine x 2)     Status: None (Preliminary result)   Collection Time: 11/20/21  9:36 AM   Specimen: BLOOD  Result Value Ref Range Status   Specimen Description BLOOD RIGHT ANTECUBITAL  Final   Special Requests   Final    BOTTLES DRAWN AEROBIC AND ANAEROBIC Blood Culture adequate volume   Culture   Final    NO GROWTH 2 DAYS Performed at [facility], [address], [place], [place] [address]    Report Status PENDING  Incomplete  Blood culture (routine x 2)     Status: None (Preliminary result)   Collection Time: 11/20/21 11:30 AM   Specimen: BLOOD  Result Value Ref Range Status   Specimen Description BLOOD BLOOD RIGHT HAND  Final   Special Requests   Final    BOTTLES DRAWN AEROBIC AND ANAEROBIC Blood Culture results may not be optimal due to an inadequate volume of blood received in culture bottles   Culture   Final    NO GROWTH 2 DAYS Performed at [facility], [address], [place],  [address]    Report Status PENDING  Incomplete     Time coordinating discharge: Over 30 minutes  SIGNED:   Nolberto Hanlon, MD  [facility] 11/22/2021, 5:27 PM Pager   If 7PM-7AM, please contact  night-coverage [email or web address] Password [email or web address]

## 2021-11-21 NOTE — H&P (View-Only) (Signed)
Cardiology Consultation:   Patient ID: Donna Fuller MRN: [id number]; DOB: Apr 20, 1941  Admit date: 11/20/2021 Date of Consult: 11/21/2021  PCP:  Elise Benne   [facility] Providers Cardiologist:  New, Dr. Brigitte Pulse   Patient Profile:   Donna Fuller is a 80 y.o. female with a hx of breast cancer s/p mastectomy, HTN, DM2 who is being seen 11/21/2021 for the evaluation of chest pain at the request of Dr. Billie Ruddy.  History of Present Illness:   Ms. Dry has no significant cardiac history. She follows with Dr. Brigitte Pulse in [place]. H/o of remote tobacco use. She reports history of cardiac catheterization 10 years ago. Said she and chest pin at that time, says they did not find any blockages. Also reports stress test over the summer that was unremarkable.   She presented to [facility] ED 12/22 for chest pain and a cough. Ohest pain started Tuesday, felt like someone was sitting on the chest. It was a sharp pain. Pain woke her up that night. Was having trouble laying down. Pain persistent over the next few days. No pain with palpitation. Pain is in the center. Pain is worse with inspiration. Not coughing anything up. Denies URI symptoms.   In the ED BP 131/58, 98% O2, pulse 78. Labs showed sodium 139, potassium 4.6, WBC 15.3, procal 1.62, HS trop 45>49>99>105>133>131. BNP 255. Hgb 12.9. CTA negative for PE, bibasilar densities, extensive coronary artery and aortic calcifications. He was started on abx and admitted.    Past Medical History:  Diagnosis Date   Arthritis    Breast cancer [facility]) 2007   Right breast, s/p mastectomy   Diabetes mellitus without complication ([place])    GERD (gastroesophageal reflux disease)    Gout    Hyperlipidemia    Hypertension    Spinal stenosis     Past Surgical History:  Procedure Laterality Date   ABDOMINAL HYSTERECTOMY     BREAST BIOPSY Left    2016 benign    MASTECTOMY     right side   REPLACEMENT TOTAL KNEE Bilateral    TUBAL LIGATION        Home Medications:  Prior to Admission medications   Medication Sig Start Date End Date Taking? Authorizing Provider  acetaminophen (TYLENOL) 650 MG CR tablet Take 650 mg by mouth 2 (two) times daily.   Yes [provider]  ascorbic acid (VITAMIN C) 500 MG tablet Take 500 mg by mouth daily.   Yes [provider]  aspirin 81 MG tablet Take 81 mg by mouth daily.   Yes [provider]  baclofen (LIORESAL) 10 MG tablet Take 10 mg by mouth 3 (three) times daily as needed. 11/01/19  Yes [provider]  Biotin 1000 MCG CHEW Chew by mouth.   Yes [provider]  brimonidine (ALPHAGAN) 0.2 % ophthalmic solution Place 1 drop into both eyes 2 (two) times a day. 05/08/19  Yes [provider]  Cholecalciferol (VITAMIN D-3) 5000 units TABS Take 1 tablet by mouth daily.   Yes [provider]  gabapentin (NEURONTIN) 300 MG capsule TAKE 1 CAPSULE BY MOUTH THREE TIMES DAILY ,  MAY  TAKE  AN  ADDITIONAL  CAPSULE  IN  THE  EVENING Patient taking differently: Take 300 mg by mouth See admin instructions. Take 379m three times daily, may take an additional 3058min the evening. 11/20/17  Yes SoLeone HavenMD  glipiZIDE (GLUCOTROL) 5 MG tablet Take 1 tablet (5 mg total) by mouth daily  before breakfast AND 0.5 tablets (2.5 mg total) daily before supper. 10/16/21  Yes Shamleffer, Melanie Crazier, MD  ketorolac (ACULAR) 0.5 % ophthalmic solution INSTILL 1 DROP IN RIGHT EYE FOUR TIMES DAILY 02/11/21  Yes Rankin, Clent Demark, MD  latanoprost (XALATAN) 0.005 % ophthalmic solution Place 1 drop into both eyes daily. 05/05/19  Yes [provider]  losartan (COZAAR) 50 MG tablet Take 50 mg by mouth daily. 11/08/19  Yes [provider]  Magnesium 250 MG TABS Take 1 tablet by mouth daily.   Yes [provider]  meloxicam (MOBIC) 7.5 MG tablet Take 1 tablet (7.5 mg total) by mouth daily as needed. 07/29/21  Yes Rosemarie Ax, MD  metoprolol  succinate (TOPROL-XL) 25 MG 24 hr tablet Take 25 mg by mouth daily. 05/06/20  Yes [provider]  montelukast (SINGULAIR) 10 MG tablet Take 10 mg by mouth at bedtime.   Yes [provider]  omeprazole (PRILOSEC) 40 MG capsule Take 1 capsule (40 mg total) by mouth daily. Must keep appt on 12/06/16 with Dr. Caryl Bis 11/11/17  Yes Leone Haven, MD  pioglitazone (ACTOS) 15 MG tablet Take 1 tablet (15 mg total) by mouth daily. 10/16/21  Yes Shamleffer, Melanie Crazier, MD  pravastatin (PRAVACHOL) 20 MG tablet TAKE 1 TABLET BY MOUTH ONCE DAILY Patient taking differently: Take 20 mg by mouth daily. 09/27/17  Yes Leone Haven, MD  timolol (TIMOPTIC) 0.5 % ophthalmic solution Place 1 drop into both eyes 2 (two) times a day. 03/15/19  Yes [provider]  triamterene-hydrochlorothiazide (MAXZIDE-25) 37.5-25 MG tablet Take 1 tablet by mouth daily. 05/29/21  Yes [provider]  zinc gluconate 50 MG tablet Take 50 mg by mouth daily.   Yes [provider]  blood glucose meter kit and supplies KIT Dispense based on patient and insurance preference. Use up to four times daily as directed. (FOR ICD-9 250.00, 250.01). 07/09/17   Thersa Salt G, DO  Dulaglutide (TRULICITY) 3 CX/4.4YJ SOPN Inject 3 mg as directed once a week. 10/16/21   Shamleffer, Melanie Crazier, MD  ferrous sulfate 325 (65 FE) MG tablet Take 325 mg by mouth daily with breakfast. Patient not taking: Reported on 11/03/2021    [provider]  furosemide (LASIX) 20 MG tablet Take 20 mg by mouth daily. Patient not taking: Reported on 11/03/2021 11/08/19   [provider]  glucose blood (ONE TOUCH ULTRA TEST) test strip USE AS DIRECTED TO CHECK BLOOD SUGAR 2-3 TIMES A DAY Pt will need an OV for more refills 10/21/21   Shamleffer, Melanie Crazier, MD  OneTouch Delica Lancets 85U MISC Use as instructed to check blood sugar 2-3 times a day.  DX E11.9 10/21/21   Shamleffer, Melanie Crazier,  MD    Inpatient Medications: Scheduled Meds:  aspirin  81 mg Oral Daily   brimonidine  1 drop Both Eyes BID   enoxaparin (LOVENOX) injection  40 mg Subcutaneous Q24H   gabapentin  300 mg Oral TID   latanoprost  1 drop Both Eyes Daily   lidocaine  1 patch Transdermal Q24H   metoprolol succinate  25 mg Oral Daily   montelukast  10 mg Oral QHS   pantoprazole  40 mg Oral Daily   pravastatin  20 mg Oral QHS   timolol  1 drop Both Eyes BID   Continuous Infusions:  azithromycin Stopped (11/20/21 1318)   cefTRIAXone (ROCEPHIN)  IV Stopped (11/20/21 1220)   PRN Meds: acetaminophen, gabapentin, guaiFENesin-dextromethorphan, ondansetron (ZOFRAN) IV,  ondansetron  Allergies:    Allergies  Allergen Reactions   Penicillins Swelling   Sulfa Antibiotics Swelling   Amlodipine Swelling    LE Swelling   Lisinopril Cough    Social History:   Social History   Socioeconomic History   Marital status: Married    Spouse name: Jimmy   Number of children: 3   Years of education: 12   Highest education level: Not on file  Occupational History   Not on file  Tobacco Use   Smoking status: Former    Years: 5.00    Types: Cigarettes   Smokeless tobacco: Never  Vaping Use   Vaping Use: Never used  Substance and Sexual Activity   Alcohol use: No   Drug use: No   Sexual activity: Not Currently  Other Topics Concern   Not on file  Social History Narrative   Marital Status:  Married Manufacturing systems engineer)    Children:  Daughter(1) Son (1)    Pets:  None    Living Situation: Lives with husband and daughter.     Occupation:  Retired Licensed conveyancer)    Education: 12th Grade    Tobacco Use/Exposure:  She used to smoke socially during the weekends but quit 40 years ago.    Alcohol Use:  Occasional   Drug Use:  None   Diet:  Regular   Exercise:  None   Hobbies:  Reading and playing volleyball             Social Determinants of Health   Financial Resource Strain: Low Risk    Difficulty of Paying Living  Expenses: Not hard at all  Food Insecurity: No Food Insecurity   Worried About Charity fundraiser in the Last Year: Never true   Ran Out of Food in the Last Year: Never true  Transportation Needs: No Transportation Needs   Lack of Transportation (Medical): No   Lack of Transportation (Non-Medical): No  Physical Activity: Inactive   Days of Exercise per Week: 0 days   Minutes of Exercise per Session: 0 min  Stress: No Stress Concern Present   Feeling of Stress : Not at all  Social Connections: Moderately Integrated   Frequency of Communication with Friends and Family: More than three times a week   Frequency of Social Gatherings with Friends and Family: More than three times a week   Attends Religious Services: More than 4 times per year   Active Member of [employer] or Organizations: No   Attends Music therapist: Never   Marital Status: Married  Human resources officer Violence: Not At Risk   Fear of Current or Ex-Partner: No   Emotionally Abused: No   Physically Abused: No   Sexually Abused: No    Family History:    Family History  Problem Relation Age of Onset   Diabetes Mother    Alzheimer's disease Mother    Hypertension Mother    Hypertension Father    Heart disease Father    Hyperlipidemia Father    Kidney disease Father    Diabetes Sister    Cancer Brother        lung   Alzheimer's disease Brother    Heart disease Brother    Hypertension Brother    Diabetes Brother    Hypertension Brother    Diabetes Brother    Hyperlipidemia Son    Hypertension Son    Diabetes Son    Hypertension Daughter    Hyperlipidemia Daughter  ROS:  Please see the history of present illness.   All other ROS reviewed and negative.     Physical Exam/Data:   Vitals:   11/20/21 1900 11/20/21 2043 11/21/21 0428 11/21/21 0735  BP: 125/64 (!) 132/58 122/65 130/62  Pulse: 86 85 83 85  Resp: (!) _0 Temp:  98.9 F (37.2 C) 98.3 F (36.8 C) 100.2 F (37.9 C)   TempSrc:  Oral    SpO2: 98% 100% 99% 98%  Weight:      Height:       No intake or output data in the 24 hours ending 11/21/21 0813 Last 3 Weights 11/20/2021 11/03/2021 10/16/2021  Weight (lbs) 200 lb 212 lb 6.4 oz 212 lb  Weight (kg) 90.719 kg 96.344 kg 96.163 kg     Body mass index is 37.79 kg/m.  General:  Well nourished, well developed, in no acute distress HEENT: normal Neck: no JVD Vascular: No carotid bruits; Distal pulses 2+ bilaterally Cardiac:  normal S1, S2; RRR; no murmur  Lungs:  clear to auscultation bilaterally, no wheezing, rhonchi or rales  Abd: soft, nontender, no hepatomegaly  Ext: no edema Musculoskeletal:  No deformities, BUE and BLE strength normal and equal Skin: warm and dry  Neuro:  CNs 2-12 intact, no focal abnormalities noted Psych:  Normal affect   EKG:  The EKG was personally reviewed and demonstrates:  NSR, 77bpm, TWI III, aVF Telemetry:  Telemetry was personally reviewed and demonstrates:  NSR, PVC, HR 80s  Relevant CV Studies:  Echo ordered  Echo 2020  1. The left ventricle has normal systolic function with an ejection  fraction of 60-65%. The cavity size was normal. Left ventricular diastolic  Doppler parameters are consistent with impaired relaxation. No evidence of  left ventricular regional wall  motion abnormalities.   2. The right ventricle has normal systolic function. The cavity was  normal. There is no increase in right ventricular wall thickness.   3. Mild calcification of the mitral valve leaflet. No evidence of mitral  valve stenosis. Trivial mitral regurgitation.   4. The aortic valve is tricuspid. No stenosis of the aortic valve.   5. The aortic root is normal in size and structure.   6. Normal IVC size. No complete TR doppler jet so unable to estimate PA  systolic pressure.   Laboratory Data:  High Sensitivity Troponin:   Recent Labs  Lab 11/20/21 0926 11/20/21 1651 11/20/21 2137 11/21/21 0055 11/21/21 0449   TROPONINIHS 49* 99* 105* 133* 131*     Chemistry Recent Labs  Lab 11/20/21 0741 11/21/21 0055  NA 139 136  K 4.6 4.0  CL 105 105  CO2 27 26  GLUCOSE 164* 111*  BUN 31* 29*  CREATININE 1.23* 1.23*  CALCIUM 9.5 8.8*  MG  --  2.2  GFRNONAA 45* 45*  ANIONGAP 7 5    No results for input(s): PROT, ALBUMIN, AST, ALT, ALKPHOS, BILITOT in the last 168 hours. Lipids No results for input(s): CHOL, TRIG, HDL, LABVLDL, LDLCALC, CHOLHDL in the last 168 hours.  Hematology Recent Labs  Lab 11/20/21 0741 11/21/21 0055  WBC 15.3* 10.5  RBC 4.28 3.68*  HGB 12.9 10.9*  HCT 39.9 33.3*  MCV 93.2 90.5  MCH 30.1 29.6  MCHC 32.3 32.7  RDW 14.4 14.5  PLT 196 179   Thyroid No results for input(s): TSH, FREET4 in the last 168 hours.  BNP Recent Labs  Lab 11/20/21 0926  BNP 255.8*  DDimer No results for input(s): DDIMER in the last 168 hours.   Radiology/Studies:  DG Chest 2 View  Result Date: 11/20/2021 CLINICAL DATA:  Dyspnea and chest pain EXAM: CHEST - 2 VIEW COMPARISON:  10/06/2021 chest radiograph. FINDINGS: Surgical clips overlie the right axilla. Stable cardiomediastinal silhouette with mild cardiomegaly. No pneumothorax. No pleural effusion. Patchy bibasilar lung opacities, left greater than right. No overt pulmonary edema. IMPRESSION: Patchy bibasilar lung opacities, left greater than right, which could represent aspiration, pneumonia or atelectasis. Recommend follow-up chest radiographs to resolution. Stable cardiomegaly without overt pulmonary edema. Electronically Signed   By: Ilona Sorrel M.D.   On: 11/20/2021 08:25   CT Angio Chest PE W and/or Wo Contrast  Result Date: 11/20/2021 CLINICAL DATA:  Shortness of breath, cough EXAM: CT ANGIOGRAPHY CHEST WITH CONTRAST TECHNIQUE: Multidetector CT imaging of the chest was performed using the standard protocol during bolus administration of intravenous contrast. Multiplanar CT image reconstructions and MIPs were obtained to  evaluate the vascular anatomy. CONTRAST:  66m OMNIPAQUE IOHEXOL 350 MG/ML SOLN COMPARISON:  05/30/2019 FINDINGS: Cardiovascular: Cardiomegaly. Extensive coronary artery and aortic calcifications. No aneurysm. No filling defects in the pulmonary arteries to suggest pulmonary emboli. Mediastinum/Nodes: No mediastinal, hilar, or axillary adenopathy. Trachea and esophagus are unremarkable. Thyroid unremarkable. Lungs/Pleura: Minimal bibasilar densities, likely atelectasis. No other confluent opacities or effusions. Upper Abdomen: Imaging into the upper abdomen demonstrates no acute findings. Musculoskeletal: Chest wall soft tissues are unremarkable. Prior right mastectomy. No acute bony abnormality. Review of the MIP images confirms the above findings. IMPRESSION: No evidence of pulmonary embolus. Cardiomegaly, diffuse coronary artery disease. Bibasilar atelectasis. Aortic Atherosclerosis (ICD10-I70.0). Electronically Signed   By: KRolm BaptiseM.D.   On: 11/20/2021 10:39     Assessment and Plan:   Elevated troponin Coronary artery calcifications - patient admitted for chest pain and cough. Chest pain with typical and atypical symptoms, found to have possible PNA, elevated troponin and coronary artery calcifications - HS trop peak 133 - EKG with TWI III and aVF, re-check EKG - check echo - chest pain better today - continue ASA 82mdaily - continue PTA Toprol 2552maily - Patient reports normal cardiac cath 10 years ago for chest pain. Also reports normal stress test over the summer that was unremarkable.  - LHC vs stress test discussed with patient, unsure if she wants to pursue testing at this point.  Possible PNA - WBC elevated and imaging with possible PNA - started on abx - chest pain   HTN - continue PTA Losartan, Lopressor - Bps good  HLD - continue Pravastatin, may need high intensity statin - LDL 77 in 2020 - re-check lipid panel  DM2 - SSI per Im  For questions or updates,  please contact [facility] Please consult [email or web address] for contact info under    Signed, Camryn Lampson H FNinfa MeekerA-C  11/21/2021 8:13 AM

## 2021-11-21 NOTE — Plan of Care (Signed)

## 2021-11-21 NOTE — TOC Initial Note (Signed)
Transition of Care [facility]) - Initial/Assessment Note    Patient Details  Name: Donna Fuller MRN: [id number] Date of Birth: 1941-03-05  Transition of Care [facility]) CM/SW Contact:    Conception Oms, RN Phone Number: 11/21/2021, 9:13 AM  Clinical Narrative:                  Transition of Care [facility]) Screening Note   Patient Details  Name: Donna Fuller Date of Birth: 1940/12/11   Transition of Care [facility]) CM/SW Contact:    Conception Oms, RN Phone Number: 11/21/2021, 9:13 AM    Transition of Care Department [facility]) has reviewed patient and no TOC needs have been identified at this time. We will continue to monitor patient advancement through interdisciplinary progression rounds. If new patient transition needs arise, please place a TOC consult.          Patient Goals and CMS Choice        Expected Discharge Plan and Services                                                Prior Living Arrangements/Services                       Activities of Daily Living      Permission Sought/Granted                  Emotional Assessment              Admission diagnosis:  Atypical chest pain [R07.89] SIRS (systemic inflammatory response syndrome) (HCC) [R65.10] Chest pain, unspecified type [R07.9] Sepsis, due to unspecified organism, unspecified whether acute organ dysfunction present [facility]) [A41.9] Patient Active Problem List   Diagnosis Date Noted   Atypical chest pain 11/20/2021   DDD (degenerative disc disease), lumbar 11/03/2021   Rheumatoid factor positive 07/29/2021   History of total bilateral knee replacement 07/29/2021   Other idiopathic scoliosis, thoracic region 07/08/2021   Estrogen deficiency 07/08/2021   Arthralgia of both hands 07/08/2021   Moderate nonproliferative diabetic retinopathy of left eye ([place]) 02/11/2021   Pseudophakia 02/11/2021   Primary open angle glaucoma of both eyes, moderate stage 02/11/2021    Cystoid macular edema, right eye 02/11/2021   Type 2 diabetes mellitus with hyperglycemia, without long-term current use of insulin ([place]) 12/14/2019   Type 2 diabetes mellitus with diabetic polyneuropathy, without long-term current use of insulin ([place]) 12/14/2019   Pleuritic chest pain 05/30/2019   Fever 05/30/2019   Yeast vaginitis 07/19/2017   Urinary frequency 07/19/2017   CKD (chronic kidney disease) stage 3, GFR 30-59 ml/min (HCC) 05/20/2017   Gout 05/20/2017   Hyperlipidemia 09/26/2016   Spinal stenosis 09/26/2016   History of breast cancer 09/26/2016   Diabetes mellitus type 2, controlled ([place]) 09/26/2016   GERD (gastroesophageal reflux disease) 09/26/2016   Facet arthropathy, lumbosacral 03/13/2015   Neuropathy 03/13/2015   Hypertension    Lumbar radiculopathy 01/08/2014   PCP:  Mackie Pai, PA-C Pharmacy:   [facility] [place] [address] [facility] [place] [address] Phone: [phone] Fax: [phone]     Social Determinants of Health (SDOH) Interventions    Readmission Risk Interventions No flowsheet data found.

## 2021-11-21 NOTE — Interval H&P Note (Signed)
Cath Lab Visit (complete for each Cath Lab visit)  Clinical Evaluation Leading to the Procedure:   ACS: Yes.    Non-ACS:  n/a   History and Physical Interval Note:  11/21/2021 12:02 PM  Donna Fuller  has presented today for surgery, with the diagnosis of chest pain.  The various methods of treatment have been discussed with the patient and family. After consideration of risks, benefits and other options for treatment, the patient has consented to  Procedure(s): LEFT HEART CATH AND CORONARY ANGIOGRAPHY (N/A) as a surgical intervention.  The patient's history has been reviewed, patient examined, no change in status, stable for surgery.  I have reviewed the patient's chart and labs.  Questions were answered to the patient's satisfaction.     Kathlyn [place]

## 2021-11-21 NOTE — Progress Notes (Signed)
*  PRELIMINARY RESULTS* Echocardiogram 2D Echocardiogram has been performed.  Wallie Char Phares Zaccone 11/21/2021, 10:47 AM

## 2021-11-21 NOTE — Progress Notes (Signed)
Cardiac catheterization completed Nonobstructive coronary disease noted Echocardiogram: Normal ejection fraction, moderate LVH Etiology of chest pain is unclear, unable to exclude esophageal or coronary spasm versus other GI pathology No further cardiac work-up needed  Signed, Esmond Plants, MD, Ph.D [facility]

## 2021-11-25 ENCOUNTER — Telehealth: Payer: Self-pay

## 2021-11-25 ENCOUNTER — Encounter: Payer: Self-pay | Admitting: Cardiovascular Disease

## 2021-11-25 LAB — CULTURE, BLOOD (ROUTINE X 2)
Culture: NO GROWTH
Culture: NO GROWTH
Special Requests: ADEQUATE

## 2021-11-25 NOTE — Telephone Encounter (Signed)
Transition Care Management Follow-up Telephone Call Date of discharge and from where: 11/22/2021-[facility] How have you been since you were released from the hospital? Going good just feeling tired Any questions or concerns? Yes Patient wanted to know if she should be taking the Triamterene-hctz-Patient advised per discharge instruction not to take it. She will discuss with PCP at her f/u appt tomorrow.  Items Reviewed: Did the pt receive and understand the discharge instructions provided? Yes  Medications obtained and verified? Yes  Other? Yes  Any new allergies since your discharge? No  Dietary orders reviewed? Yes Do you have support at home? Yes   Home Care and Equipment/Supplies: Were home health services ordered? no If so, what is the name of the agency? N/a  Has the agency set up a time to come to the patient's home? not applicable Were any new equipment or medical supplies ordered?  No What is the name of the medical supply agency? N/a Were you able to get the supplies/equipment? not applicable Do you have any questions related to the use of the equipment or supplies? No  Functional Questionnaire: (I = Independent and D = Dependent) ADLs: I  Bathing/Dressing- I  Meal Prep- D  Eating- I  Maintaining continence- I  Transferring/Ambulation- I with cane  Managing Meds- I  Follow up appointments reviewed:  PCP Hospital f/u appt confirmed? Yes  Scheduled to see Mackie Pai on 11/26/21 @ 1:00. [place] Hospital f/u appt confirmed?  N/a   Are transportation arrangements needed? No  If their condition worsens, is the pt aware to call PCP or go to the Emergency Dept.? Yes Was the patient provided with contact information for the PCP's office or ED? Yes Was to pt encouraged to call back with questions or concerns? Yes

## 2021-11-26 ENCOUNTER — Ambulatory Visit (HOSPITAL_BASED_OUTPATIENT_CLINIC_OR_DEPARTMENT_OTHER)
Admission: RE | Admit: 2021-11-26 | Discharge: 2021-11-26 | Disposition: A | Discharge status: Home / Self Care | Payer: HMO | Source: Ambulatory Visit | Attending: Medical | Admitting: Medical

## 2021-11-26 ENCOUNTER — Ambulatory Visit (INDEPENDENT_AMBULATORY_CARE_PROVIDER_SITE_OTHER): Payer: HMO | Admitting: Medical

## 2021-11-26 ENCOUNTER — Other Ambulatory Visit: Payer: Self-pay

## 2021-11-26 VITALS — BP 114/50 | HR 84 | Temp 98.5°F | Resp 18 | Ht 61.0 in | Wt 203.0 lb

## 2021-11-26 DIAGNOSIS — R0609 Other forms of dyspnea: Secondary | ICD-10-CM

## 2021-11-26 DIAGNOSIS — M25511 Pain in right shoulder: Secondary | ICD-10-CM | POA: Diagnosis present

## 2021-11-26 DIAGNOSIS — R944 Abnormal results of kidney function studies: Secondary | ICD-10-CM

## 2021-11-26 DIAGNOSIS — M79601 Pain in right arm: Secondary | ICD-10-CM | POA: Diagnosis not present

## 2021-11-26 DIAGNOSIS — I10 Essential (primary) hypertension: Secondary | ICD-10-CM | POA: Diagnosis not present

## 2021-11-26 DIAGNOSIS — K219 Gastro-esophageal reflux disease without esophagitis: Secondary | ICD-10-CM

## 2021-11-26 LAB — COMPREHENSIVE METABOLIC PANEL
ALT: 10 U/L (ref 0–35)
AST: 16 U/L (ref 0–37)
Albumin: 3.7 g/dL (ref 3.5–5.2)
Alkaline Phosphatase: 75 U/L (ref 39–117)
BUN: 21 mg/dL (ref 6–23)
CO2: 30 mEq/L (ref 19–32)
Calcium: 9.8 mg/dL (ref 8.4–10.5)
Chloride: 102 mEq/L (ref 96–112)
Creatinine, Ser: 1.37 mg/dL — ABNORMAL HIGH (ref 0.40–1.20)
GFR: 36.62 mL/min — ABNORMAL LOW (ref >60.00)
Glucose, Bld: 106 mg/dL — ABNORMAL HIGH (ref 70–99)
Potassium: 4.5 mEq/L (ref 3.5–5.1)
Sodium: 138 mEq/L (ref 135–145)
Total Bilirubin: 0.3 mg/dL (ref 0.2–1.2)
Total Protein: 7.2 g/dL (ref 6.0–8.3)

## 2021-11-26 LAB — CBC WITH DIFFERENTIAL/PLATELET
Basophils Absolute: 0 10*3/uL (ref 0.0–0.1)
Basophils Relative: 0.5 % (ref 0.0–3.0)
Eosinophils Absolute: 0.1 10*3/uL (ref 0.0–0.7)
Eosinophils Relative: 1.7 % (ref 0.0–5.0)
HCT: 34 % — ABNORMAL LOW (ref 36.0–46.0)
Hemoglobin: 11 g/dL — ABNORMAL LOW (ref 12.0–15.0)
Lymphocytes Relative: 24.5 % (ref 12.0–46.0)
Lymphs Abs: 1.6 10*3/uL (ref 0.7–4.0)
MCHC: 32.5 g/dL (ref 30.0–36.0)
MCV: 90.8 fl (ref 78.0–100.0)
Monocytes Absolute: 0.5 10*3/uL (ref 0.1–1.0)
Monocytes Relative: 7.6 % (ref 3.0–12.0)
Neutro Abs: 4.4 10*3/uL (ref 1.4–7.7)
Neutrophils Relative %: 65.7 % (ref 43.0–77.0)
Platelets: 244 10*3/uL (ref 150.0–400.0)
RBC: 3.74 Mil/uL — ABNORMAL LOW (ref 3.87–5.11)
RDW: 14.5 % (ref 11.5–15.5)
WBC: 6.7 10*3/uL (ref 4.0–10.5)

## 2021-11-26 LAB — BRAIN NATRIURETIC PEPTIDE: Pro B Natriuretic peptide (BNP): 246 pg/mL — ABNORMAL HIGH (ref 0.0–100.0)

## 2021-11-26 MED ORDER — DOXYCYCLINE HYCLATE 100 MG PO TABS: 100.0000 mg | ORAL_TABLET | Freq: Two times a day (BID) | ORAL | 0 refills | Status: DC

## 2021-11-26 MED ORDER — FAMOTIDINE 20 MG PO TABS: 20.0000 mg | ORAL_TABLET | Freq: Every day | ORAL | 3 refills | Status: DC

## 2021-11-26 NOTE — Addendum Note (Signed)
Addended by: Anabel Halon on: 11/26/2021 10:46 PM   Modules accepted: Orders

## 2021-11-26 NOTE — Progress Notes (Signed)
Subjective:    Patient ID: Donna Fuller, female    DOB: 1941-03-27, 80 y.o.   MRN: [id number]  HPI  Pt in for follow up from hospital.   Admit date: 11/20/2021 Discharge date: 11/22/2021   Admitted From: home Disposition:  home   Recommendations for Outpatient Follow-up:  Follow up with PCP in 1 week Please obtain BMP/CBC in one week  Discharge Condition:Stable CODE STATUS: Full Diet recommendation: Heart Healthy Brief/Interim Summary: Per DDU:KGURKY T Renville is a 80 y.o. female with medical history significant of breast cancer s/p mastectomy, HTN, DM2 who presented with chest pain and cough.  EKG with nonischemic ST changes.  CTA negative for PE.  Please see full result below.  Cardiology was consulted.  Patient was taken to cardiac catheterization and was not found with any obstructive CAD.  This was a noncardiac chest pain.  After patient received post protocol cath IV fluids she was discharged home.  She was treated for possible pneumonia due to leukocytosis and procalcitonin being elevated.  She was stable for discharge.  She was instructed to follow-up with primary care next week for blood work To evaluate her kidney function  Discharge Diagnoses:  Principal Problem:   Angina pectoris ([place]) Active Problems:   Diabetes mellitus type 2 with complications ([place])   CAD (coronary artery disease)   LV hypertrophy, hypertensive   Elevated troponin   On dc.     STOP taking these medications     baclofen 10 MG tablet Commonly known as: LIORESAL    ferrous sulfate 325 (65 FE) MG tablet    furosemide 20 MG tablet Commonly known as: LASIX    meloxicam 7.5 MG tablet Commonly known as: Mobic    triamterene-hydrochlorothiazide 37.5-25 MG tablet Commonly known as: MAXZIDE-25   Post hostpilaztion pt not having any chest pain or shortness of breath. No cough, no chills or fevers.  Pt states she had some rt hand pain before cardiac catheteration.  Rt hand mri  below. IMPRESSION: Intercarpal and MCP joint predominant arthritis of the wrist and hand, with evidence of erosive change at the second and fifth metacarpophalangeal joints and intercarpal joints. Slight volar subluxation and ulnar deviation at the second MCP joint and volar subluxation at the small finger PIP joint. Mild tenosynovitis of extensor compartment 2. Overall findings are suggestive of inflammatory arthritis such as rheumatoid, recommend correlation with serologic markers.   Central tear of the TFCC articular disc with associated trace DRUJ effusion.  Dr Patrecia Pour ordered the hand mri  But she has mild antecubital pain and pain upper rt arm more in medial aspect of rt bicep. Also recent shoulder pain.  Over past 2 years pt has had some elevated troponins. She has daily shortness if breath. Occurring for more than a year.  Pt has gerd- pt is on omeprazole. Pt is on 40 mg omeprazole.   Pt a1c one month ago was 6.0.     Review of Systems  Constitutional:  Negative for chills, fatigue and fever.  Respiratory:  Negative for cough, chest tightness and wheezing.   Cardiovascular:  Negative for chest pain and palpitations.  Gastrointestinal:  Negative for abdominal distention, anal bleeding and diarrhea.  Genitourinary:  Negative for difficulty urinating, dyspareunia, dysuria, flank pain, frequency, menstrual problem and urgency.  Musculoskeletal:  Negative for back pain.       Shoulder pain  Skin:  Negative for color change and rash.  Psychiatric/Behavioral:  Negative for behavioral problems, confusion and sleep disturbance.  The patient is not nervous/anxious.    Past Medical History:  Diagnosis Date   Arthritis    Breast cancer ([place]) 2007   Right breast, s/p mastectomy   Diabetes mellitus without complication (H[facility])    GERD (gastroesophageal reflux disease)    Gout    Hyperlipidemia    Hypertension    Spinal stenosis      Social History   Socioeconomic History    Marital status: Married    Spouse name: Jimmy   Number of children: 3   Years of education: 12   Highest education level: Not on file  Occupational History   Not on file  Tobacco Use   Smoking status: Former    Years: 5.00    Types: Cigarettes   Smokeless tobacco: Never  Vaping Use   Vaping Use: Never used  Substance and Sexual Activity   Alcohol use: No   Drug use: No   Sexual activity: Not Currently  Other Topics Concern   Not on file  Social History Narrative   Marital Status:  Married Manufacturing systems engineer)    Children:  Daughter(1) Son (1)    Pets:  None    Living Situation: Lives with husband and daughter.     Occupation:  Retired Licensed conveyancer)    Education: 12th Grade    Tobacco Use/Exposure:  She used to smoke socially during the weekends but quit 40 years ago.    Alcohol Use:  Occasional   Drug Use:  None   Diet:  Regular   Exercise:  None   Hobbies:  Reading and playing volleyball             Social Determinants of Health   Financial Resource Strain: Low Risk    Difficulty of Paying Living Expenses: Not hard at all  Food Insecurity: No Food Insecurity   Worried About Charity fundraiser in the Last Year: Never true   Ran Out of Food in the Last Year: Never true  Transportation Needs: No Transportation Needs   Lack of Transportation (Medical): No   Lack of Transportation (Non-Medical): No  Physical Activity: Inactive   Days of Exercise per Week: 0 days   Minutes of Exercise per Session: 0 min  Stress: No Stress Concern Present   Feeling of Stress : Not at all  Social Connections: Moderately Integrated   Frequency of Communication with Friends and Family: More than three times a week   Frequency of Social Gatherings with Friends and Family: More than three times a week   Attends Religious Services: More than 4 times per year   Active Member of [employer] or Organizations: No   Attends Archivist Meetings: Never   Marital Status: Married  Human resources officer Violence:  Not At Risk   Fear of Current or Ex-Partner: No   Emotionally Abused: No   Physically Abused: No   Sexually Abused: No    Past Surgical History:  Procedure Laterality Date   ABDOMINAL HYSTERECTOMY     BREAST BIOPSY Left    2016 benign    LEFT HEART CATH AND CORONARY ANGIOGRAPHY N/A 11/21/2021   Procedure: LEFT HEART CATH AND CORONARY ANGIOGRAPHY;  Surgeon: Wellington Hampshire, MD;  Location: [facility];  Service: Cardiovascular;  Laterality: N/A;   MASTECTOMY     right side   REPLACEMENT TOTAL KNEE Bilateral    TUBAL LIGATION      Family History  Problem Relation Age of Onset   Diabetes Mother  Alzheimer's disease Mother    Hypertension Mother    Hypertension Father    Heart disease Father    Hyperlipidemia Father    Kidney disease Father    Diabetes Sister    Cancer Brother        lung   Alzheimer's disease Brother    Heart disease Brother    Hypertension Brother    Diabetes Brother    Hypertension Brother    Diabetes Brother    Hyperlipidemia Son    Hypertension Son    Diabetes Son    Hypertension Daughter    Hyperlipidemia Daughter     Allergies  Allergen Reactions   Penicillins Swelling   Sulfa Antibiotics Swelling   Amlodipine Swelling    LE Swelling   Lisinopril Cough    Current Outpatient Medications on File Prior to Visit  Medication Sig Dispense Refill   acetaminophen (TYLENOL) 650 MG CR tablet Take 650 mg by mouth 2 (two) times daily.     ascorbic acid (VITAMIN C) 500 MG tablet Take 500 mg by mouth daily.     aspirin 81 MG tablet Take 81 mg by mouth daily.     Biotin 1000 MCG CHEW Chew by mouth.     blood glucose meter kit and supplies KIT Dispense based on patient and insurance preference. Use up to four times daily as directed. (FOR ICD-9 250.00, 250.01). 1 each 11   brimonidine (ALPHAGAN) 0.2 % ophthalmic solution Place 1 drop into both eyes 2 (two) times a day.     Cholecalciferol (VITAMIN D-3) 5000 units TABS Take 1 tablet by  mouth daily.     Dulaglutide (TRULICITY) 3 GB/1.5VV SOPN Inject 3 mg as directed once a week. 6 mL 3   gabapentin (NEURONTIN) 300 MG capsule TAKE 1 CAPSULE BY MOUTH THREE TIMES DAILY ,  MAY  TAKE  AN  ADDITIONAL  CAPSULE  IN  THE  EVENING (Patient taking differently: Take 300 mg by mouth See admin instructions. Take 363m three times daily, may take an additional 3064min the evening.) 100 capsule 0   glipiZIDE (GLUCOTROL) 5 MG tablet Take 1 tablet (5 mg total) by mouth daily before breakfast AND 0.5 tablets (2.5 mg total) daily before supper. 135 tablet 3   glucose blood (ONE TOUCH ULTRA TEST) test strip USE AS DIRECTED TO CHECK BLOOD SUGAR 2-3 TIMES A DAY Pt will need an OV for more refills 100 each 0   ketorolac (ACULAR) 0.5 % ophthalmic solution INSTILL 1 DROP IN RIGHT EYE FOUR TIMES DAILY 5 mL 6   latanoprost (XALATAN) 0.005 % ophthalmic solution Place 1 drop into both eyes daily.     losartan (COZAAR) 50 MG tablet Take 50 mg by mouth daily.     Magnesium 250 MG TABS Take 1 tablet by mouth daily.     metoprolol succinate (TOPROL-XL) 25 MG 24 hr tablet Take 25 mg by mouth daily.     montelukast (SINGULAIR) 10 MG tablet Take 10 mg by mouth at bedtime.     omeprazole (PRILOSEC) 40 MG capsule Take 1 capsule (40 mg total) by mouth daily. Must keep appt on 12/06/16 with Dr. SoCaryl Bis0 capsule 0   OneTouch Delica Lancets 3361YISC Use as instructed to check blood sugar 2-3 times a day.  DX E11.9 100 each 3   pioglitazone (ACTOS) 15 MG tablet Take 1 tablet (15 mg total) by mouth daily. 90 tablet 3   pravastatin (PRAVACHOL) 20 MG tablet TAKE 1 TABLET BY  MOUTH ONCE DAILY (Patient taking differently: Take 20 mg by mouth daily.) 90 tablet 0   timolol (TIMOPTIC) 0.5 % ophthalmic solution Place 1 drop into both eyes 2 (two) times a day.     zinc gluconate 50 MG tablet Take 50 mg by mouth daily.     No current facility-administered medications on file prior to visit.    BP (!) 114/50    Pulse 84    Temp  98.5 F (36.9 C)    Resp 18    Ht '5\' 1"'  (1.549 m)    Wt 203 lb (92.1 kg)    SpO2 98%    BMI 38.36 kg/m       Objective:   Physical Exam  General Mental Status- Alert. General Appearance- Not in acute distress.   Skin General: Color- Normal Color. Moisture- Normal Moisture.  Neck Carotid Arteries- Normal color. Moisture- Normal Moisture. No carotid bruits. No JVD.  Chest and Lung Exam Auscultation: Breath Sounds:-Normal.  Cardiovascular Auscultation:Rythm- Regular. Murmurs & Other Heart Sounds:Auscultation of the heart reveals- No Murmurs.  Abdomen Inspection:-Inspeection Normal. Palpation/Percussion:Note:No mass. Palpation and Percussion of the abdomen reveal- Non Tender, Non Distended + BS, no rebound or guarding.   Neurologic Cranial Nerve exam:- CN III-XII intact(No nystagmus), symmetric smile. Strength:- 5/5 equal and symmetric strength both upper and lower extremities.       Assessment & Plan:   Patient Instructions  Recent hospitalization for chest pain and shortness of breath.  Minimal troponin elevation that down trended.  Cardiac catheterization came back overall negative/no significant abnormality.  Patient was treated for pneumonia.  Shortness of breath and chest pain resolved.  On review does have mild BNP elevation in the past.  Will repeat BNP and chest x-ray today.  Referring patient back to her cardiologist.  If you have any recurrent chest pain or severe shortness of breath recommend ED evaluation again.  Hypertension-blood pressure very well controlled/on the lower end.  I do agree presently not to take your diuretics.  Diuretics can lower your blood pressure excessively.  Would not recommend diuretics presently.  Decreased GFR-we will get CMP today.  Right shoulder pain since hospitalization.  We will get right shoulder x-ray.  Went ahead and referred to sports medicine.  I think he might have some frozen shoulder.  Right upper extremity pain upper  arm/medial bicep.  Some mild tenderness and swelling.  With recent cardiac catheterization and IV placement will get stat ultrasound to make sure no DVT present.  If no upper extremity DVT found then will need to get antibiotic to treat for cellulitis.  I want you to wait downstairs until you get results of the ultrasound back.  If you do have a DVT might need to advise further work-up through the emergency department.  For history of GERD-continue omeprazole.  If you have any breakthrough reflux then prescribed famotidine.  Follow-up date to be determined after lab and imaging review.   Mackie Pai, PA-C

## 2021-11-26 NOTE — Patient Instructions (Signed)
Recent hospitalization for chest pain and shortness of breath.  Minimal troponin elevation that down trended.  Cardiac catheterization came back overall negative/no significant abnormality.  Patient was treated for pneumonia.  Shortness of breath and chest pain resolved.  On review does have mild BNP elevation in the past.  Will repeat BNP and chest x-ray today.  Referring patient back to her cardiologist.  If you have any recurrent chest pain or severe shortness of breath recommend ED evaluation again.  Hypertension-blood pressure very well controlled/on the lower end.  I do agree presently not to take your diuretics.  Diuretics can lower your blood pressure excessively.  Would not recommend diuretics presently.  Decreased GFR-we will get CMP today.  Right shoulder pain since hospitalization.  We will get right shoulder x-ray.  Went ahead and referred to sports medicine.  I think he might have some frozen shoulder.  Right upper extremity pain upper arm/medial bicep.  Some mild tenderness and swelling.  With recent cardiac catheterization and IV placement will get stat ultrasound to make sure no DVT present.  If no upper extremity DVT found then will need to get antibiotic to treat for cellulitis.  I want you to wait downstairs until you get results of the ultrasound back.  If you do have a DVT might need to advise further work-up through the emergency department.  For history of GERD-continue omeprazole.  If you have any breakthrough reflux then prescribed famotidine.  Follow-up date to be determined after lab and imaging review.

## 2021-11-27 NOTE — Addendum Note (Signed)
Addended by: Anabel Halon on: 11/27/2021 05:53 AM   Modules accepted: Orders

## 2021-11-28 NOTE — Progress Notes (Signed)
MRI of the hand is consistent with erosive rheumatoid arthritis.  Patient is positive rheumatoid factor.  Please schedule an appointment with Lovena Le or me to discuss treatment options most likely leflunomide or Biologics.

## 2021-12-04 ENCOUNTER — Ambulatory Visit: Payer: Medicare HMO | Admitting: Physician Assistant

## 2021-12-04 ENCOUNTER — Encounter: Payer: Self-pay | Admitting: Physician Assistant

## 2021-12-04 ENCOUNTER — Other Ambulatory Visit: Payer: Self-pay

## 2021-12-04 VITALS — BP 129/72 | HR 90 | Ht 61.0 in | Wt 205.2 lb

## 2021-12-04 DIAGNOSIS — H401132 Primary open-angle glaucoma, bilateral, moderate stage: Secondary | ICD-10-CM

## 2021-12-04 DIAGNOSIS — Z96653 Presence of artificial knee joint, bilateral: Secondary | ICD-10-CM | POA: Diagnosis not present

## 2021-12-04 DIAGNOSIS — Z111 Encounter for screening for respiratory tuberculosis: Secondary | ICD-10-CM

## 2021-12-04 DIAGNOSIS — Z79899 Other long term (current) drug therapy: Secondary | ICD-10-CM

## 2021-12-04 DIAGNOSIS — M51369 Other intervertebral disc degeneration, lumbar region without mention of lumbar back pain or lower extremity pain: Secondary | ICD-10-CM

## 2021-12-04 DIAGNOSIS — M7062 Trochanteric bursitis, left hip: Secondary | ICD-10-CM

## 2021-12-04 DIAGNOSIS — M4124 Other idiopathic scoliosis, thoracic region: Secondary | ICD-10-CM

## 2021-12-04 DIAGNOSIS — I10 Essential (primary) hypertension: Secondary | ICD-10-CM

## 2021-12-04 DIAGNOSIS — M059 Rheumatoid arthritis with rheumatoid factor, unspecified: Secondary | ICD-10-CM | POA: Diagnosis not present

## 2021-12-04 DIAGNOSIS — Z8639 Personal history of other endocrine, nutritional and metabolic disease: Secondary | ICD-10-CM

## 2021-12-04 DIAGNOSIS — E1142 Type 2 diabetes mellitus with diabetic polyneuropathy: Secondary | ICD-10-CM

## 2021-12-04 DIAGNOSIS — K219 Gastro-esophageal reflux disease without esophagitis: Secondary | ICD-10-CM

## 2021-12-04 DIAGNOSIS — Z853 Personal history of malignant neoplasm of breast: Secondary | ICD-10-CM

## 2021-12-04 DIAGNOSIS — M5136 Other intervertebral disc degeneration, lumbar region: Secondary | ICD-10-CM

## 2021-12-04 DIAGNOSIS — E113392 Type 2 diabetes mellitus with moderate nonproliferative diabetic retinopathy without macular edema, left eye: Secondary | ICD-10-CM

## 2021-12-04 DIAGNOSIS — N1831 Chronic kidney disease, stage 3a: Secondary | ICD-10-CM

## 2021-12-04 NOTE — Progress Notes (Signed)
Pharmacy Note  Subjective: Patient presents today to the [facility] for follow up office visit.  Patient seen by the pharmacist for counseling on leflunomide Jolee Ewing) for rheumatoid arthritis. She is naive to immunosuppressants  Objective: CBC    Component Value Date/Time   WBC 6.7 11/26/2021 1403   RBC 3.74 (L) 11/26/2021 1403   HGB 11.0 (L) 11/26/2021 1403   HCT 34.0 (L) 11/26/2021 1403   PLT 244.0 11/26/2021 1403   MCV 90.8 11/26/2021 1403   MCH 29.6 11/21/2021 0055   MCHC 32.5 11/26/2021 1403   RDW 14.5 11/26/2021 1403   LYMPHSABS 1.6 11/26/2021 1403   MONOABS 0.5 11/26/2021 1403   EOSABS 0.1 11/26/2021 1403   BASOSABS 0.0 11/26/2021 1403    CMP     Component Value Date/Time   NA 138 11/26/2021 1403   K 4.5 11/26/2021 1403   CL 102 11/26/2021 1403   CO2 30 11/26/2021 1403   GLUCOSE 106 (H) 11/26/2021 1403   BUN 21 11/26/2021 1403   CREATININE 1.37 (H) 11/26/2021 1403   CREATININE 1.40 (H) 06/27/2021 1513   CALCIUM 9.8 11/26/2021 1403   PROT 7.2 11/26/2021 1403   PROT 7.1 12/12/2014 1022   ALBUMIN 3.7 11/26/2021 1403   AST 16 11/26/2021 1403   ALT 10 11/26/2021 1403   ALKPHOS 75 11/26/2021 1403   BILITOT 0.3 11/26/2021 1403   GFRNONAA 45 (L) 11/21/2021 0055   GFRAA 51 (L) 06/03/2019 0549   Immunoglobulin Electrophoresis Latest Ref Rng & Units 12/12/2014  IgG 700 - 1,600 mg/dL 1,100  IgM 40 - 230 mg/dL 73    Serum Protein Electrophoresis Latest Ref Rng & Units 11/26/2021  Total Protein 6.0 - 8.3 g/dL 7.2   Pregnancy status:  post-menopausal Chest x-ray on 11/26/21 - Stable cardiomegaly without failure  Assessment/Plan:  Patient was counseled on the purpose, proper use, and adverse effects of leflunomide including risk of infection, nausea/diarrhea/weight loss, increase in blood pressure, rash, hair loss, tingling in the hands and feet, and signs and symptoms of interstitial lung disease.   Also counseled on Black Box warning of liver injury and  importance of avoiding alcohol while on therapy. Discussed that there is the possibility of an increased risk of malignancy but it is not well understood if this increased risk is due to the medication or the disease state.  Counseled patient to avoid live vaccines. Recommend annual influenza, Pneumovax 23, Prevnar 13, and Shingrix as indicated.   Discussed the importance of frequent monitoring of liver function and blood count.  Standing orders placed.  Discussed importance of birth control while on leflunomide due to risk of congenital abnormalities, and patient confirms she is post-menopausal.  Provided patient with educational materials on leflunomide and answered all questions.  Patient consented to [place] use, and consent will be uploaded into the media tab.    Patient dose will be leflunomide 10mg  once daily x 2 weeks, then repeat labs. If labs are stable then increase to 20mg  daily.  Prescription pending baseline immunosuppressive lab results drawn today (see below)  Patient states she currently has yeast infection and is taking antibiotics - has been advised to hold leflunomide if infection is not resolved and if antibiotic course is not complete. Advised her to reach out to PCP to discuss yeast infection management. Stressed that she must not have any active infection or be treated with antibiotics when starting  - she verbalized understanding  Orders Placed This Encounter  Procedures   Hepatitis B core  antibody, IgM   Hepatitis B surface antigen   Hepatitis C antibody   HIV Antibody (routine testing w rflx)   QuantiFERON-TB Gold Plus    yearly   Serum protein electrophoresis with reflex   IgG, IgA, IgM   CBC with Differential/Platelet    Standing Status:   Standing    Number of Occurrences:   6    Standing Expiration Date:   12/04/2022   COMPLETE METABOLIC PANEL WITH GFR    Standing Status:   Standing    Number of Occurrences:   6    Standing Expiration Date:   12/04/2022   Knox Saliva, PharmD, MPH, BCPS Clinical Pharmacist (Rheumatology and Pulmonology)

## 2021-12-04 NOTE — Patient Instructions (Addendum)
Once labs result from today we will call you.  Plan is to start leflunomide (Arava) at 10mg  once daily. REPEAT labs 2 weeks after starting. IF stable, we will increase to 20mg  once daily. THEN REPEAT labs again   If you have signs or symptoms of an infection or start antibiotics: First, call your PCP for workup of your infection. Hold your medication through the infection, until you complete your antibiotics, and until symptoms resolve  Standing Labs We placed an order today for your standing lab work.   Please have your standing labs drawn in 2 weeks. If stable will increase dose to 20mg  once daily. Repeat labs in 2 weeks again  If possible, please have your labs drawn 2 weeks prior to your appointment so that the provider can discuss your results at your appointment.  Please note that you may see your imaging and lab results in [facility] before we have reviewed them. We may be awaiting multiple results to interpret others before contacting you. Please allow our office up to 72 hours to thoroughly review all of the results before contacting the office for clarification of your results.  We have open lab daily: Monday through Thursday from 1:30-4:30 PM and Friday from 1:30-4:00 PM at the office of Dr. Bo Merino, [facility].   Please be advised, all patients with office appointments requiring lab work will take precedent over walk-in lab work.  If possible, please come for your lab work on Monday and Friday afternoons, as you may experience shorter wait times. The office is located at [address] [place], [place], [place] [address] No appointment is necessary.   Labs are drawn by [facility]. Please bring your co-pay at the time of your lab draw.  You may receive a bill from [facility] for your lab work.  If you wish to have your labs drawn at another location, please call the office 24 hours in advance to send orders.  If you have any questions regarding directions or  hours of operation,  please call [phone].   As a reminder, please drink plenty of water prior to coming for your lab work. Thanks!     Leflunomide tablets What is this medication? LEFLUNOMIDE (le FLOO na mide) is for rheumatoid arthritis. This medicine may be used for other purposes; ask your health care provider or pharmacist if you have questions. COMMON BRAND NAME(S): Arava What should I tell my care team before I take this medication? They need to know if you have any of these conditions: diabetes have a fever or infection high blood pressure immune system problems kidney disease liver disease low blood cell counts, like low white cell, platelet, or red cell counts lung or breathing disease, like asthma recently received or scheduled to receive a vaccine receiving treatment for cancer skin conditions or sensitivity tingling of the fingers or toes, or other nerve disorder tuberculosis an unusual or allergic reaction to leflunomide, teriflunomide, other medicines, food, dyes, or preservatives pregnant or trying to get pregnant breast-feeding How should I use this medication? Take this medicine by mouth with a full glass of water. Follow the directions on the prescription label. Take your medicine at regular intervals. Do not take your medicine more often than directed. Do not stop taking except on your doctor's advice. Talk to your pediatrician regarding the use of this medicine in children. Special care may be needed. Overdosage: If you think you have taken too much of this medicine contact a poison control center or  emergency room at once. NOTE: This medicine is only for you. Do not share this medicine with others. What if I miss a dose? If you miss a dose, take it as soon as you can. If it is almost time for your next dose, take only that dose. Do not take double or extra doses. What may interact with this medication? Do not take this medicine with any of the following  medications: teriflunomide This medicine may also interact with the following medications: alosetron birth control pills caffeine cefaclor certain medicines for diabetes like nateglinide, repaglinide, rosiglitazone, pioglitazone certain medicines for high cholesterol like atorvastatin, pravastatin, rosuvastatin, simvastatin charcoal cholestyramine ciprofloxacin duloxetine furosemide ketoprofen live virus vaccines medicines that increase your risk for infection methotrexate mitoxantrone paclitaxel penicillin theophylline tizanidine warfarin This list may not describe all possible interactions. Give your health care provider a list of all the medicines, herbs, non-prescription drugs, or dietary supplements you use. Also tell them if you smoke, drink alcohol, or use illegal drugs. Some items may interact with your medicine. What should I watch for while using this medication? Visit your health care provider for regular checks on your progress. Tell your doctor or health care provider if your symptoms do not start to get better or if they get worse. You may need blood work done while you are taking this medicine. This medicine may cause serious skin reactions. They can happen weeks to months after starting the medicine. Contact your health care provider right away if you notice fevers or flu-like symptoms with a rash. The rash may be red or purple and then turn into blisters or peeling of the skin. Or, you might notice a red rash with swelling of the face, lips or lymph nodes in your neck or under your arms. This medicine may stay in your body for up to 2 years after your last dose. Tell your doctor about any unusual side effects or symptoms. A medicine can be given to help lower your blood levels of this medicine more quickly. Women must use effective birth control with this medicine. There is a potential for serious side effects to an unborn child. Do not become pregnant while taking this  medicine. Inform your doctor if you wish to become pregnant. This medicine remains in your blood after you stop taking it. You must continue using effective birth control until the blood levels have been checked and they are low enough. A medicine can be given to help lower your blood levels of this medicine more quickly. Immediately talk to your doctor if you think you may be pregnant. You may need a pregnancy test. Talk to your health care provider or pharmacist for more information. You should not receive certain vaccines during your treatment and for a certain time after your treatment with this medication ends. Talk to your health care provider for more information. What side effects may I notice from receiving this medication? Side effects that you should report to your doctor or health care professional as soon as possible: allergic reactions like skin rash, itching or hives, swelling of the face, lips, or tongue breathing problems cough increased blood pressure low blood counts - this medicine may decrease the number of white blood cells and platelets. You may be at increased risk for infections and bleeding. pain, tingling, numbness in the hands or feet rash, fever, and swollen lymph nodes redness, blistering, peeing or loosening of the skin, including inside the mouth signs of decreased platelets or bleeding - bruising, pinpoint  red spots on the skin, black, tarry stools, blood in urine signs of infection - fever or chills, cough, sore throat, pain or trouble passing urine signs and symptoms of liver injury like dark yellow or brown urine; general ill feeling or flu-like symptoms; light-colored stools; loss of appetite; nausea; right upper belly pain; unusually weak or tired; yellowing of the eyes or skin trouble passing urine or change in the amount of urine vomiting Side effects that usually do not require medical attention (report to your doctor or health care professional if they  continue or are bothersome): diarrhea hair thinning or loss headache nausea tiredness This list may not describe all possible side effects. Call your doctor for medical advice about side effects. You may report side effects to FDA at 1-800-FDA-1088. Where should I keep my medication? Keep out of the reach of children. Store at room temperature between 15 and 30 degrees C (59 and 86 degrees F). Protect from moisture and light. Throw away any unused medicine after the expiration date. NOTE: This sheet is a summary. It may not cover all possible information. If you have questions about this medicine, talk to your doctor, pharmacist, or health care provider.  2022 [employer] (2019-02-24 00:00:00)

## 2021-12-04 NOTE — Progress Notes (Signed)
Office Visit Note  Patient: Donna Fuller             Date of Birth: 03-26-1941           MRN: [id number]             PCP: Mackie Pai, PA-C Referring: Ann Held, * Visit Date: 12/04/2021 Occupation: @GUAROCC @  Subjective:  Discuss medication options   History of Present Illness: Donna Fuller is a 81 y.o. female with history of seropositive rheumatoid arthritis.  Patient presents today to discuss recent MRI results, lab work, and the neck steps.  She continues to have persistent pain in both shoulder joints especially her right shoulder.  She is also having pain and inflammation in the right wrist.  Her pain is most severe at night.  She has been taking Tylenol twice a day for pain relief.  She states she has ongoing discomfort in both knee replacements.  Activities of Daily Living:  Patient reports morning stiffness for all day. Patient Reports nocturnal pain.  Difficulty dressing/grooming: Denies Difficulty climbing stairs: Reports Difficulty getting out of chair: Reports Difficulty using hands for taps, buttons, cutlery, and/or writing: Reports  Review of Systems  Constitutional:  Positive for fatigue.  HENT:  Positive for mouth dryness and nose dryness. Negative for mouth sores.   Eyes:  Positive for dryness. Negative for pain and itching.  Respiratory:  Positive for shortness of breath. Negative for difficulty breathing.   Cardiovascular:  Negative for chest pain and palpitations.  Gastrointestinal:  Negative for blood in stool, constipation and diarrhea.  Endocrine: Negative for increased urination.  Genitourinary:  Negative for difficulty urinating.  Musculoskeletal:  Positive for joint pain, joint pain, joint swelling, myalgias, morning stiffness, muscle tenderness and myalgias.  Skin:  Negative for color change, rash and redness.  Allergic/Immunologic: Positive for susceptible to infections.  Neurological:  Positive for dizziness, numbness,  headaches and weakness. Negative for memory loss.  Hematological:  Positive for bruising/bleeding tendency.  Psychiatric/Behavioral:  Negative for confusion.    PMFS History:  Patient Active Problem List   Diagnosis Date Noted   Angina pectoris ([place]) 11/21/2021   Diabetes mellitus type 2 with complications ([place]) 50/27/7412   CAD (coronary artery disease) 11/21/2021   LV hypertrophy, hypertensive 11/21/2021   Elevated troponin 11/21/2021   Atypical chest pain 11/20/2021   DDD (degenerative disc disease), lumbar 11/03/2021   Rheumatoid factor positive 07/29/2021   History of total bilateral knee replacement 07/29/2021   Other idiopathic scoliosis, thoracic region 07/08/2021   Estrogen deficiency 07/08/2021   Arthralgia of both hands 07/08/2021   Moderate nonproliferative diabetic retinopathy of left eye ([place]) 02/11/2021   Pseudophakia 02/11/2021   Primary open angle glaucoma of both eyes, moderate stage 02/11/2021   Cystoid macular edema, right eye 02/11/2021   Type 2 diabetes mellitus with hyperglycemia, without long-term current use of insulin ([place]) 12/14/2019   Type 2 diabetes mellitus with diabetic polyneuropathy, without long-term current use of insulin ([place]) 12/14/2019   Pleuritic chest pain 05/30/2019   Fever 05/30/2019   Yeast vaginitis 07/19/2017   Urinary frequency 07/19/2017   CKD (chronic kidney disease) stage 3, GFR 30-59 ml/min ([facility]) 05/20/2017   Gout 05/20/2017   Hyperlipidemia 09/26/2016   Spinal stenosis 09/26/2016   History of breast cancer 09/26/2016   Diabetes mellitus type 2, controlled ([place]) 09/26/2016   GERD (gastroesophageal reflux disease) 09/26/2016   Facet arthropathy, lumbosacral 03/13/2015   Neuropathy 03/13/2015   Hypertension  Lumbar radiculopathy 01/08/2014    Past Medical History:  Diagnosis Date   Arthritis    Breast cancer ([place]) 2007   Right breast, s/p mastectomy   Diabetes mellitus without complication ([place])    GERD (gastroesophageal  reflux disease)    Gout    Hyperlipidemia    Hypertension    Spinal stenosis     Family History  Problem Relation Age of Onset   Diabetes Mother    Alzheimer's disease Mother    Hypertension Mother    Hypertension Father    Heart disease Father    Hyperlipidemia Father    Kidney disease Father    Diabetes Sister    Cancer Brother        lung   Alzheimer's disease Brother    Heart disease Brother    Hypertension Brother    Diabetes Brother    Hypertension Brother    Diabetes Brother    Hyperlipidemia Son    Hypertension Son    Diabetes Son    Hypertension Daughter    Hyperlipidemia Daughter    Past Surgical History:  Procedure Laterality Date   ABDOMINAL HYSTERECTOMY     BREAST BIOPSY Left    2016 benign    LEFT HEART CATH AND CORONARY ANGIOGRAPHY N/A 11/21/2021   Procedure: LEFT HEART CATH AND CORONARY ANGIOGRAPHY;  Surgeon: Wellington Hampshire, MD;  Location: [facility];  Service: Cardiovascular;  Laterality: N/A;   MASTECTOMY     right side   REPLACEMENT TOTAL KNEE Bilateral    TUBAL LIGATION     Social History   Social History Narrative   Marital Status:  Married Manufacturing systems engineer)    Children:  Daughter(1) Son (1)    Pets:  None    Living Situation: Lives with husband and daughter.     Occupation:  Retired Licensed conveyancer)    Education: 12th Grade    Tobacco Use/Exposure:  She used to smoke socially during the weekends but quit 40 years ago.    Alcohol Use:  Occasional   Drug Use:  None   Diet:  Regular   Exercise:  None   Hobbies:  Reading and playing volleyball             Immunization History  Administered Date(s) Administered   Influenza,inj,Quad PF,6+ Mos 08/30/2016   Influenza-Unspecified 08/18/2021   Moderna Covid-19 Vaccine Bivalent Booster 51yrs & up 08/29/2021   Moderna Sars-Covid-2 Vaccination 12/25/2019, 01/11/2020, 02/05/2020   Pneumococcal Conjugate-13 05/22/2015   Pneumococcal Polysaccharide-23 10/06/2021     Objective: Vital Signs: BP  129/72 (BP Location: Left Arm, Patient Position: Sitting, Cuff Size: Large)    Pulse 90    Ht 5\' 1"  (1.549 m)    Wt 205 lb 3.2 oz (93.1 kg)    BMI 38.77 kg/m    Physical Exam Vitals and nursing note reviewed.  Constitutional:      Appearance: She is well-developed.  HENT:     Head: Normocephalic and atraumatic.  Eyes:     Conjunctiva/sclera: Conjunctivae normal.  Pulmonary:     Effort: Pulmonary effort is normal.  Abdominal:     Palpations: Abdomen is soft.  Musculoskeletal:     Cervical back: Normal range of motion.  Skin:    General: Skin is warm and dry.     Capillary Refill: Capillary refill takes less than 2 seconds.  Neurological:     Mental Status: She is alert and oriented to person, place, and time.  Psychiatric:  Behavior: Behavior normal.     Musculoskeletal Exam: Sees spine has good range of motion with no discomfort.  Thoracic kyphosis and thoracolumbar scoliosis noted.  No midline spinal tenderness.  Painful range of motion of both shoulder joints especially the right shoulder.  Limited abduction of the right shoulder to about 90 degrees.  Elbow joints have good range of motion with no tenderness or inflammation.  Some tenderness and mild extensor tenosynovitis of the right wrist.  PIP and DIP thickening consistent with osteoarthritis of both hands.  Incomplete extension of bilateral PIP joints.  No tenderness or synovitis over MCP joints.  Hip joints difficult to assess in seated position.  Both knee replacements have good range of motion with warmth but no swelling.  Pedal edema noted bilaterally.  Some tenderness over both ankle joints.  CDAI Exam: CDAI Score: 6  Patient Global: 5 mm; Provider Global: 5 mm Swollen: 1 ; Tender: 6  Joint Exam 12/04/2021      Right  Left  Glenohumeral   Tender     Wrist  Swollen Tender     Knee   Tender   Tender  Ankle   Tender   Tender     Investigation: No additional findings.  Imaging: DG Chest 2 View  Result  Date: 11/26/2021 CLINICAL DATA:  Dyspnea on exertion.  Shortness of breath. EXAM: CHEST - 2 VIEW COMPARISON:  Two-view chest x-ray 11/20/2021.  CTA chest 11/20/2021 FINDINGS: Heart is enlarged. Atherosclerotic changes are again noted at the aortic arch. Lung volumes are improved. Patchy airspace opacities are again noted, left greater than right with improved aeration. No effusions are present. Axial skeleton is unremarkable. IMPRESSION: 1. Stable cardiomegaly without failure. 2. Patchy airspace opacities, left greater than right with improved aeration. Findings may represent atelectasis or infection. Electronically Signed   By: San Morelle M.D.   On: 11/26/2021 15:27   DG Chest 2 View  Result Date: 11/20/2021 CLINICAL DATA:  Dyspnea and chest pain EXAM: CHEST - 2 VIEW COMPARISON:  10/06/2021 chest radiograph. FINDINGS: Surgical clips overlie the right axilla. Stable cardiomediastinal silhouette with mild cardiomegaly. No pneumothorax. No pleural effusion. Patchy bibasilar lung opacities, left greater than right. No overt pulmonary edema. IMPRESSION: Patchy bibasilar lung opacities, left greater than right, which could represent aspiration, pneumonia or atelectasis. Recommend follow-up chest radiographs to resolution. Stable cardiomegaly without overt pulmonary edema. Electronically Signed   By: Ilona Sorrel M.D.   On: 11/20/2021 08:25   DG Shoulder Right  Result Date: 11/27/2021 CLINICAL DATA:  Right shoulder pain EXAM: RIGHT SHOULDER - 2+ VIEW COMPARISON:  None. FINDINGS: No recent fracture or dislocation is seen in the right shoulder. There are no abnormal soft tissue calcifications. Surgical clips are seen in right chest wall. Bony spurs are noted in the right AC joint. IMPRESSION: No significant radiographic abnormality is seen in the right shoulder. Degenerative changes are noted with bony spurs in the right AC joint. Electronically Signed   By: Elmer Picker M.D.   On: 11/27/2021  15:54   CT Angio Chest PE W and/or Wo Contrast  Result Date: 11/20/2021 CLINICAL DATA:  Shortness of breath, cough EXAM: CT ANGIOGRAPHY CHEST WITH CONTRAST TECHNIQUE: Multidetector CT imaging of the chest was performed using the standard protocol during bolus administration of intravenous contrast. Multiplanar CT image reconstructions and MIPs were obtained to evaluate the vascular anatomy. CONTRAST:  86mL OMNIPAQUE IOHEXOL 350 MG/ML SOLN COMPARISON:  05/30/2019 FINDINGS: Cardiovascular: Cardiomegaly. Extensive coronary artery and aortic calcifications. No  aneurysm. No filling defects in the pulmonary arteries to suggest pulmonary emboli. Mediastinum/Nodes: No mediastinal, hilar, or axillary adenopathy. Trachea and esophagus are unremarkable. Thyroid unremarkable. Lungs/Pleura: Minimal bibasilar densities, likely atelectasis. No other confluent opacities or effusions. Upper Abdomen: Imaging into the upper abdomen demonstrates no acute findings. Musculoskeletal: Chest wall soft tissues are unremarkable. Prior right mastectomy. No acute bony abnormality. Review of the MIP images confirms the above findings. IMPRESSION: No evidence of pulmonary embolus. Cardiomegaly, diffuse coronary artery disease. Bibasilar atelectasis. Aortic Atherosclerosis (ICD10-I70.0). Electronically Signed   By: Rolm Baptise M.D.   On: 11/20/2021 10:39   CARDIAC CATHETERIZATION  Result Date: 11/21/2021   Mid LAD lesion is 20% stenosed.   LV end diastolic pressure is mildly elevated. 1.  Mild nonobstructive coronary artery disease. 2.  Left ventricular angiography was not performed.  EF was normal by echo. 3.  Mildly elevated left ventricular end-diastolic pressure at 18 mmHg. Recommendations: No culprit is identified for patient's symptoms and mildly elevated troponin.   MR HAND RIGHT WO CONTRAST  Result Date: 11/26/2021 CLINICAL DATA:  Chronic right hand pain and notes cystic changes in metacarpophalangeal joint. Clinical  concern for rheumatoid arthritis. EXAM: MRI OF THE RIGHT HAND WITHOUT CONTRAST TECHNIQUE: Multiplanar, multisequence MR imaging of the right hand was performed. No intravenous contrast was administered. COMPARISON:  X-ray hand 11/03/2021. FINDINGS: Bones/Joint/Cartilage There is intercarpal arthritis with cystic change/possible erosions. Most suspicious for erosion is along the dorsal aspect of the proximal pole the scaphoid, lunate, and volar aspect of the triquetrum. At the MCP joints, there is evidence of marginal erosive change along the radial aspects of the fifth metacarpal head and base of the fifth proximal phalanx (coronal T1 image 6), as well as at the ulnar aspect of the second metacarpal head and base of second proximal phalanx (coronal T1 image 6. There is associated marrow edema in the base of the second proximal phalanx. There is slight volar subluxation and ulnar deviation at the index finger metacarpophalangeal joint. Slight volar subluxation at the fifth digit proximal interphalangeal joint. There is subchondral marrow edema along these joints. There is mild partial-thickness cartilage loss at the radiocarpal joint and second and fifth metacarpophalangeal joints. Traced DRUJ and intercarpal effusion. Trace effusion of the second through fifth metacarpophalangeal joints. There is mild joint space narrowing with subchondral cystic change at the thumb IP joint which is likely due to degenerative arthritis. Ligaments There is a central tear of the TFCC articular disc with associated trace DRUJ effusion. The scapholunate and lunotriquetral ligaments appear intact. There is no evidence of a pulley injury. Muscles and Tendons Mild tenosynovial fluid along the extensor carpi radialis longus and brevis tendons. No acute tendon tear. The other extensor tendons are unremarkable. The flexor tendons are unremarkable. No muscle edema or muscle atrophy. Soft tissues No focal fluid collection. IMPRESSION:  Intercarpal and MCP joint predominant arthritis of the wrist and hand, with evidence of erosive change at the second and fifth metacarpophalangeal joints and intercarpal joints. Slight volar subluxation and ulnar deviation at the second MCP joint and volar subluxation at the small finger PIP joint. Mild tenosynovitis of extensor compartment 2. Overall findings are suggestive of inflammatory arthritis such as rheumatoid, recommend correlation with serologic markers. Central tear of the TFCC articular disc with associated trace DRUJ effusion. Electronically Signed   By: Maurine Simmering M.D.   On: 11/26/2021 12:31   US Venous Img Upper Uni Right(DVT)  Result Date: 11/26/2021 CLINICAL DATA:  rt upper ext pain  after cardiac catherization approximate one week ago. most of pain is in medial bicep area. Mild swollen compared to left side. EXAM: RIGHT UPPER EXTREMITY VENOUS DOPPLER ULTRASOUND TECHNIQUE: Gray-scale sonography with graded compression, as well as color Doppler and duplex ultrasound were performed to evaluate the upper extremity deep venous system from the level of the subclavian vein and including the jugular, axillary, basilic, radial, ulnar and upper cephalic vein. Spectral Doppler was utilized to evaluate flow at rest and with distal augmentation maneuvers. COMPARISON:  None. FINDINGS: Contralateral Subclavian Vein: Respiratory phasicity is normal and symmetric with the symptomatic side. No evidence of thrombus. Normal compressibility. Internal Jugular Vein: No evidence of thrombus. Normal compressibility, respiratory phasicity and response to augmentation. Subclavian Vein: No evidence of thrombus. Normal compressibility, respiratory phasicity and response to augmentation. Axillary Vein: No evidence of thrombus. Normal compressibility, respiratory phasicity and response to augmentation. Cephalic Vein: No evidence of thrombus. Normal compressibility, respiratory phasicity and response to augmentation.  Basilic Vein: No evidence of thrombus. Normal compressibility, respiratory phasicity and response to augmentation. Brachial Veins: No evidence of thrombus. Normal compressibility, respiratory phasicity and response to augmentation. Radial Veins: No evidence of thrombus. Normal compressibility, respiratory phasicity and response to augmentation. Ulnar Veins: No evidence of thrombus. Normal compressibility, respiratory phasicity and response to augmentation. IMPRESSION: No evidence of DVT within the right upper extremity. Electronically Signed   By: Margaretha Sheffield M.D.   On: 11/26/2021 15:08   ECHOCARDIOGRAM COMPLETE  Result Date: 11/21/2021    ECHOCARDIOGRAM REPORT   Patient Name:   ZYRAH WISWELL Date of Exam: 11/21/2021 Medical Rec #:  [id number]        Height:       61.0 in Accession #:    [id number]       Weight:       200.0 lb Date of Birth:  07/23/1941        BSA:          1.889 m Patient Age:    89 years         BP:           130/62 mmHg Patient Gender: F                HR:           81 bpm. Exam Location:  [facility] Procedure: 2D Echo, Color Doppler, Cardiac Doppler and Strain Analysis Indications:     R07.9 Chest Pain  History:         Patient has prior history of Echocardiogram examinations. Risk                  Factors:Hypertension, Diabetes and Dyslipidemia.  Sonographer:     Charmayne Sheer Referring Phys:  [place] Diagnosing Phys: Ida Rogue MD  Sonographer Comments: Global longitudinal strain was attempted. IMPRESSIONS  1. Left ventricular ejection fraction, by estimation, is 60 to 65%. The left ventricle has normal function. The left ventricle has no regional wall motion abnormalities. There is moderate left ventricular hypertrophy. Left ventricular diastolic parameters are consistent with Grade II diastolic dysfunction (pseudonormalization). The average left ventricular global longitudinal strain is -13.6 %. The global longitudinal strain is abnormal.  2. Right ventricular systolic  function is normal. The right ventricular size is normal. Tricuspid regurgitation signal is inadequate for assessing PA pressure.  3. The mitral valve is normal in structure. Trivial mitral valve regurgitation. No evidence of mitral stenosis.  4. The aortic valve is tricuspid. Aortic valve regurgitation  is not visualized. No aortic stenosis is present.  5. The inferior vena cava is normal in size with greater than 50% respiratory variability, suggesting right atrial pressure of 3 mmHg. FINDINGS  Left Ventricle: Left ventricular ejection fraction, by estimation, is 60 to 65%. The left ventricle has normal function. The left ventricle has no regional wall motion abnormalities. The average left ventricular global longitudinal strain is -13.6 %. The global longitudinal strain is abnormal. The left ventricular internal cavity size was normal in size. There is moderate left ventricular hypertrophy. Left ventricular diastolic parameters are consistent with Grade II diastolic dysfunction (pseudonormalization). Right Ventricle: The right ventricular size is normal. No increase in right ventricular wall thickness. Right ventricular systolic function is normal. Tricuspid regurgitation signal is inadequate for assessing PA pressure. Left Atrium: Left atrial size was normal in size. Right Atrium: Right atrial size was normal in size. Pericardium: There is no evidence of pericardial effusion. Mitral Valve: The mitral valve is normal in structure. There is mild calcification of the mitral valve leaflet(s). Trivial mitral valve regurgitation. No evidence of mitral valve stenosis. MV peak gradient, 4.2 mmHg. The mean mitral valve gradient is 2.0  mmHg. Tricuspid Valve: The tricuspid valve is normal in structure. Tricuspid valve regurgitation is not demonstrated. No evidence of tricuspid stenosis. Aortic Valve: The aortic valve is tricuspid. Aortic valve regurgitation is not visualized. No aortic stenosis is present. Aortic valve  mean gradient measures 2.0 mmHg. Aortic valve peak gradient measures 4.0 mmHg. Aortic valve area, by VTI measures 2.72 cm. Pulmonic Valve: The pulmonic valve was normal in structure. Pulmonic valve regurgitation is not visualized. No evidence of pulmonic stenosis. Aorta: The aortic root is normal in size and structure. Venous: The inferior vena cava is normal in size with greater than 50% respiratory variability, suggesting right atrial pressure of 3 mmHg. IAS/Shunts: No atrial level shunt detected by color flow Doppler.  LEFT VENTRICLE PLAX 2D LVIDd:         3.57 cm   Diastology LVIDs:         2.19 cm   LV e' medial:    6.53 cm/s LV PW:         1.32 cm   LV E/e' medial:  17.2 LV IVS:        1.16 cm   LV e' lateral:   6.31 cm/s LVOT diam:     1.90 cm   LV E/e' lateral: 17.7 LV SV:         52 LV SV Index:   28        2D Longitudinal Strain LVOT Area:     2.84 cm  2D Strain GLS Avg:     -13.6 %  RIGHT VENTRICLE RV Basal diam:  2.53 cm LEFT ATRIUM             Index        RIGHT ATRIUM           Index LA diam:        3.90 cm 2.06 cm/m   RA Area:     10.10 cm LA Vol (A2C):   36.5 ml 19.32 ml/m  RA Volume:   16.80 ml  8.89 ml/m LA Vol (A4C):   46.0 ml 24.35 ml/m LA Biplane Vol: 44.4 ml 23.51 ml/m  AORTIC VALVE                    PULMONIC VALVE AV Area (Vmax):    2.69 cm  PV Vmax:       0.78 m/s AV Area (Vmean):   2.73 cm     PV Vmean:      53.300 cm/s AV Area (VTI):     2.72 cm     PV VTI:        0.146 m AV Vmax:           99.80 cm/s   PV Peak grad:  2.4 mmHg AV Vmean:          72.000 cm/s  PV Mean grad:  1.0 mmHg AV VTI:            0.192 m AV Peak Grad:      4.0 mmHg AV Mean Grad:      2.0 mmHg LVOT Vmax:         94.60 cm/s LVOT Vmean:        69.300 cm/s LVOT VTI:          0.184 m LVOT/AV VTI ratio: 0.96  AORTA Ao Root diam: 3.30 cm MITRAL VALVE MV Area (PHT): 5.79 cm     SHUNTS MV Area VTI:   2.00 cm     Systemic VTI:  0.18 m MV Peak grad:  4.2 mmHg     Systemic Diam: 1.90 cm MV Mean grad:  2.0 mmHg MV  Vmax:       1.03 m/s MV Vmean:      76.1 cm/s MV Decel Time: 131 msec MV E velocity: 112.00 cm/s MV A velocity: 99.00 cm/s MV E/A ratio:  1.13 Ida Rogue MD Electronically signed by Ida Rogue MD Signature Date/Time: 11/21/2021/10:55:11 AM    Final     Recent Labs: Lab Results  Component Value Date   WBC 6.7 11/26/2021   HGB 11.0 (L) 11/26/2021   PLT 244.0 11/26/2021   NA 138 11/26/2021   K 4.5 11/26/2021   CL 102 11/26/2021   CO2 30 11/26/2021   GLUCOSE 106 (H) 11/26/2021   BUN 21 11/26/2021   CREATININE 1.37 (H) 11/26/2021   BILITOT 0.3 11/26/2021   ALKPHOS 75 11/26/2021   AST 16 11/26/2021   ALT 10 11/26/2021   PROT 7.2 11/26/2021   ALBUMIN 3.7 11/26/2021   CALCIUM 9.8 11/26/2021   GFRAA 51 (L) 06/03/2019    Speciality Comments: No specialty comments available.  Procedures:  No procedures performed Allergies: Penicillins, Sulfa antibiotics, Amlodipine, and Lisinopril   Assessment / Plan:     Visit Diagnoses: Seropositive rheumatoid arthritis ([place]) - RF+, Erosive arthritis, extensor tenosynovitis: MRI results of the right hand from 11/13/2021 were reviewed with the patient today in the office: Intercarpal and MCP joint predominant arthritis of the wrist and hand with evidence of erosive change at the second and fifth MCP joints and intercarpal joints.  She has slight volar subluxation and ulnar deviation at the second MCP and volar subluxation at the small finger PIP joint.  Mild tenosynovitis of extensor compartment to noted.  Patient is rheumatoid factor positive and anti-CCP negative.  She is currently having pain in both shoulders, right greater than left as well as the right wrist.  She has mild extensor tenosynovitis and tenderness of the right wrist on examination today.  She has been experiencing nocturnal pain and has been taking Tylenol twice a day for pain relief.   Discussed the diagnosis of rheumatoid arthritis in detail.  She was also given a brochure of  information about rheumatoid arthritis to review. Different treatment options were discussed today in detail.  She is not a good candidate for methotrexate due to chronic kidney disease.  Discussed starting on Arava versus biologic since she has erosive disease.  She would like to try [place] prior to more aggressive therapy.  Indications, contraindications, and potential side effects of Arava were discussed today in detail.  All questions were addressed and consent was obtained.  Baseline immunosuppressive lab work was obtained today.  A prescription for arava will be pending lab results.  She will follow-up in the office in 6 weeks to assess her response.  She was advised to notify us if she cannot tolerate taking arava.   Medication counseling:  Baseline Immunosuppressant Therapy Labs Immunoglobulin Electrophoresis Latest Ref Rng & Units 12/12/2014  IgG 700 - 1,600 mg/dL 1,100  IgM 40 - 230 mg/dL 73    Serum Protein Electrophoresis Latest Ref Rng & Units 11/26/2021  Total Protein 6.0 - 8.3 g/dL 7.2   Patient was counseled on the purpose, proper use, and adverse effects of leflunomide including risk of infection, nausea/diarrhea/weight loss, increase in blood pressure, rash, hair loss, tingling in the hands and feet, and signs and symptoms of interstitial lung disease.   Also counseled on Black Box warning of liver injury and importance of avoiding alcohol while on therapy. Discussed that there is the possibility of an increased risk of malignancy but it is not well understood if this increased risk is due to the medication or the disease state.  Counseled patient to avoid live vaccines. Recommend annual influenza, Pneumovax 23, Prevnar 13, and Shingrix as indicated.   Discussed the importance of frequent monitoring of liver function and blood count.  Standing orders placed.  Discussed importance of birth control while on leflunomide due to risk of congenital abnormalities, and patient confirms she is  postmenopausal.  Provided patient with educational materials on leflunomide and answered all questions.  Patient consented to [place] use, and consent will be uploaded into the media tab.   Patient dose will be 10 mg daily x2 weeks and if labs are stable she will increase to 20 mg daily.  Prescription pending lab results and/or insurance approval.  High risk medication use - She will start on Arava 10 mg daily x2 weeks and if labs are stable she will increase to 20 mg daily. CBC and CMP updated on 11/26/21.  She will return for lab work in 2 weeks x 2, 2 months, then every 3 months.  Standing orders for CBC and CMP were placed today.  The following baseline immunosuppressive labs will be updated today.  Prescription pending lab results.  Plan: Hepatitis B core antibody, IgM, Hepatitis B surface antigen, Hepatitis C antibody, HIV Antibody (routine testing w rflx), QuantiFERON-TB Gold Plus, Serum protein electrophoresis with reflex, IgG, IgA, IgM, CBC with Differential/Platelet, COMPLETE METABOLIC PANEL WITH GFR Discussed the importance of holding arava if she develops signs or symptoms of an infection and to resume once the infection has completely cleared.  She voiced understanding.   Screening for tuberculosis - Order for TB gold released. Plan: QuantiFERON-TB Gold Plus  Trochanteric bursitis, left hip: She has ongoing discomfort due to trochanteric bursitis.   History of total bilateral knee replacement - Dr. Laurance Flatten in [facility] 2013. Chronic pain.  She has painful ROM with warmth but no effusion.   Other idiopathic scoliosis, thoracic region: Unchanged.    DDD (degenerative disc disease), lumbar - With facet joint arthropathy: Chronic pain.    Other medical conditions are listed as follows:  Primary hypertension: Blood pressure  was 129/72 today in the office.  History of hyperlipidemia  Stage 3a chronic kidney disease ([place]): She is not a good candidate for methotrexate.  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   Follow-Up Instructions: Return in about 6 weeks (around 01/15/2022) for Rheumatoid arthritis.   Ofilia Neas, PA-C  Note - This record has been created using Dragon software.  Chart creation errors have been sought, but may not always  have been located. Such creation errors do not reflect on  the standard of medical care.

## 2021-12-08 LAB — IGG, IGA, IGM
IgG (Immunoglobin G), Serum: 1211 mg/dL (ref 600–1540)
IgM, Serum: 103 mg/dL (ref 50–300)
Immunoglobulin A: 306 mg/dL (ref 70–320)

## 2021-12-08 LAB — PROTEIN ELECTROPHORESIS, SERUM, WITH REFLEX
Albumin ELP: 3.8 g/dL (ref 3.8–4.8)
Alpha 1: 0.4 g/dL — ABNORMAL HIGH (ref 0.2–0.3)
Alpha 2: 0.8 g/dL (ref 0.5–0.9)
Beta 2: 0.4 g/dL (ref 0.2–0.5)
Beta Globulin: 0.5 g/dL (ref 0.4–0.6)
Gamma Globulin: 1.1 g/dL (ref 0.8–1.7)
Total Protein: 7 g/dL (ref 6.1–8.1)

## 2021-12-08 LAB — QUANTIFERON-TB GOLD PLUS
Mitogen-NIL: >10 IU/mL
NIL: 0.07 IU/mL
QuantiFERON-TB Gold Plus: NEGATIVE
TB1-NIL: <0 IU/mL
TB2-NIL: <0 IU/mL

## 2021-12-08 LAB — HEPATITIS B SURFACE ANTIGEN: Hepatitis B Surface Ag: NONREACTIVE

## 2021-12-08 LAB — HEPATITIS C ANTIBODY
Hepatitis C Ab: NONREACTIVE
SIGNAL TO CUT-OFF: 0.03 (ref <1.00)

## 2021-12-08 LAB — HEPATITIS B CORE ANTIBODY, IGM: Hep B C IgM: NONREACTIVE

## 2021-12-08 LAB — HIV ANTIBODY (ROUTINE TESTING W REFLEX): HIV 1&2 Ab, 4th Generation: NONREACTIVE

## 2021-12-08 NOTE — Progress Notes (Signed)
Hepatitis B and C negative.  HIV negative.  Immunoglobulins WNL.  TB gold negative.

## 2021-12-09 ENCOUNTER — Other Ambulatory Visit: Payer: Self-pay | Admitting: Family Medicine

## 2021-12-09 DIAGNOSIS — Z1231 Encounter for screening mammogram for malignant neoplasm of breast: Secondary | ICD-10-CM

## 2021-12-09 NOTE — Progress Notes (Signed)
SPEP did not reveal any abnormal protein bands.

## 2021-12-16 ENCOUNTER — Ambulatory Visit: Payer: HMO | Admitting: Rheumatology

## 2021-12-22 ENCOUNTER — Ambulatory Visit: Payer: Medicare HMO | Admitting: Family Medicine

## 2021-12-30 ENCOUNTER — Other Ambulatory Visit: Payer: Self-pay | Admitting: Family Medicine

## 2021-12-30 ENCOUNTER — Ambulatory Visit
Admission: RE | Admit: 2021-12-30 | Discharge: 2021-12-30 | Disposition: A | Discharge status: Home / Self Care | Payer: Medicare HMO | Source: Ambulatory Visit | Attending: Family Medicine | Admitting: Family Medicine

## 2021-12-30 DIAGNOSIS — Z1231 Encounter for screening mammogram for malignant neoplasm of breast: Secondary | ICD-10-CM

## 2022-01-07 ENCOUNTER — Other Ambulatory Visit: Payer: Self-pay

## 2022-01-07 NOTE — Telephone Encounter (Signed)
Patient will come by office to fill out the [facility] application for Trulicity.

## 2022-01-08 NOTE — Telephone Encounter (Signed)
Pt came by and completed the form and left income information with it.  Forms were given to [facility].

## 2022-01-12 NOTE — Progress Notes (Unsigned)
Office Visit Note  Patient: Donna Fuller             Date of Birth: 16-Mar-1941           MRN: [id number]             PCP: Elise Benne Referring: Elise Benne Visit Date: 01/26/2022 Occupation: @GUAROCC @  Subjective:  No chief complaint on file.   History of Present Illness: Donna Fuller is a 81 y.o. female ***   Activities of Daily Living:  Patient reports morning stiffness for *** {minute/hour:19697}.   Patient {ACTIONS;DENIES/REPORTS:21021675::"Denies"} nocturnal pain.  Difficulty dressing/grooming: {ACTIONS;DENIES/REPORTS:21021675::"Denies"} Difficulty climbing stairs: {ACTIONS;DENIES/REPORTS:21021675::"Denies"} Difficulty getting out of chair: {ACTIONS;DENIES/REPORTS:21021675::"Denies"} Difficulty using hands for taps, buttons, cutlery, and/or writing: {ACTIONS;DENIES/REPORTS:21021675::"Denies"}  No Rheumatology ROS completed.   PMFS History:  Patient Active Problem List   Diagnosis Date Noted   Angina pectoris ([place]) 11/21/2021   Diabetes mellitus type 2 with complications ([place]) 78/29/5621   CAD (coronary artery disease) 11/21/2021   LV hypertrophy, hypertensive 11/21/2021   Elevated troponin 11/21/2021   Atypical chest pain 11/20/2021   DDD (degenerative disc disease), lumbar 11/03/2021   Rheumatoid factor positive 07/29/2021   History of total bilateral knee replacement 07/29/2021   Other idiopathic scoliosis, thoracic region 07/08/2021   Estrogen deficiency 07/08/2021   Arthralgia of both hands 07/08/2021   Moderate nonproliferative diabetic retinopathy of left eye ([place]) 02/11/2021   Pseudophakia 02/11/2021   Primary open angle glaucoma of both eyes, moderate stage 02/11/2021   Cystoid macular edema, right eye 02/11/2021   Type 2 diabetes mellitus with hyperglycemia, without long-term current use of insulin ([place]) 12/14/2019   Type 2 diabetes mellitus with diabetic polyneuropathy, without long-term current use of insulin ([place])  12/14/2019   Pleuritic chest pain 05/30/2019   Fever 05/30/2019   Yeast vaginitis 07/19/2017   Urinary frequency 07/19/2017   CKD (chronic kidney disease) stage 3, GFR 30-59 ml/min (HCC) 05/20/2017   Gout 05/20/2017   Hyperlipidemia 09/26/2016   Spinal stenosis 09/26/2016   History of breast cancer 09/26/2016   Diabetes mellitus type 2, controlled ([place]) 09/26/2016   GERD (gastroesophageal reflux disease) 09/26/2016   Facet arthropathy, lumbosacral 03/13/2015   Neuropathy 03/13/2015   Hypertension    Lumbar radiculopathy 01/08/2014    Past Medical History:  Diagnosis Date   Arthritis    Breast cancer ([place]) 2007   Right breast, s/p mastectomy   Diabetes mellitus without complication (HCC)    GERD (gastroesophageal reflux disease)    Gout    Hyperlipidemia    Hypertension    Spinal stenosis     Family History  Problem Relation Age of Onset   Diabetes Mother    Alzheimer's disease Mother    Hypertension Mother    Hypertension Father    Heart disease Father    Hyperlipidemia Father    Kidney disease Father    Diabetes Sister    Cancer Brother        lung   Alzheimer's disease Brother    Heart disease Brother    Hypertension Brother    Diabetes Brother    Hypertension Brother    Diabetes Brother    Hyperlipidemia Son    Hypertension Son    Diabetes Son    Hypertension Daughter    Hyperlipidemia Daughter    Past Surgical History:  Procedure Laterality Date   ABDOMINAL HYSTERECTOMY     BREAST BIOPSY Left    2016 benign    LEFT HEART CATH AND  CORONARY ANGIOGRAPHY N/A 11/21/2021   Procedure: LEFT HEART CATH AND CORONARY ANGIOGRAPHY;  Surgeon: Wellington Hampshire, MD;  Location: [facility] [facility];  Service: Cardiovascular;  Laterality: N/A;   MASTECTOMY     right side   REPLACEMENT TOTAL KNEE Bilateral    TUBAL LIGATION     Social History   Social History Narrative   Marital Status:  Married Manufacturing systems engineer)    Children:  Daughter(1) Son (1)    Pets:  None     Living Situation: Lives with husband and daughter.     Occupation:  Retired Licensed conveyancer)    Education: 12th Grade    Tobacco Use/Exposure:  She used to smoke socially during the weekends but quit 40 years ago.    Alcohol Use:  Occasional   Drug Use:  None   Diet:  Regular   Exercise:  None   Hobbies:  Reading and playing volleyball             Immunization History  Administered Date(s) Administered   Influenza,inj,Quad PF,6+ Mos 08/30/2016   Influenza-Unspecified 08/18/2021   Moderna Covid-19 Vaccine Bivalent Booster 44yrs & up 08/29/2021   Moderna Sars-Covid-2 Vaccination 12/25/2019, 01/11/2020, 02/05/2020   Pneumococcal Conjugate-13 05/22/2015   Pneumococcal Polysaccharide-23 10/06/2021     Objective: Vital Signs: There were no vitals taken for this visit.   Physical Exam   Musculoskeletal Exam: ***  CDAI Exam: CDAI Score: -- Patient Global: --; Provider Global: -- Swollen: --; Tender: -- Joint Exam 01/26/2022   No joint exam has been documented for this visit   There is currently no information documented on the homunculus. Go to the Rheumatology activity and complete the homunculus joint exam.  Investigation: No additional findings.  Imaging: MM 3D SCREEN BREAST UNI LEFT  Result Date: 12/30/2021 CLINICAL DATA:  Screening. History of right mastectomy. EXAM: DIGITAL SCREENING UNILATERAL LEFT MAMMOGRAM WITH CAD AND TOMOSYNTHESIS TECHNIQUE: Left screening digital craniocaudal and mediolateral oblique mammograms were obtained. Left screening digital breast tomosynthesis was performed. The images were evaluated with computer-aided detection. COMPARISON:  Previous exam(s). ACR Breast Density Category b: There are scattered areas of fibroglandular density. FINDINGS: There are no findings suspicious for malignancy. IMPRESSION: No mammographic evidence of malignancy. A result letter of this screening mammogram will be mailed directly to the patient. RECOMMENDATION: Screening  mammogram in one year. (Code:SM-B-01Y) BI-RADS CATEGORY  1: Negative. Electronically Signed   By: Everlean Alstrom M.D.   On: 12/30/2021 12:54   Recent Labs: Lab Results  Component Value Date   WBC 6.7 11/26/2021   HGB 11.0 (L) 11/26/2021   PLT 244.0 11/26/2021   NA 138 11/26/2021   K 4.5 11/26/2021   CL 102 11/26/2021   CO2 30 11/26/2021   GLUCOSE 106 (H) 11/26/2021   BUN 21 11/26/2021   CREATININE 1.37 (H) 11/26/2021   BILITOT 0.3 11/26/2021   ALKPHOS 75 11/26/2021   AST 16 11/26/2021   ALT 10 11/26/2021   PROT 7.0 12/04/2021   ALBUMIN 3.7 11/26/2021   CALCIUM 9.8 11/26/2021   GFRAA 51 (L) 06/03/2019   QFTBGOLDPLUS NEGATIVE 12/04/2021    Speciality Comments: No specialty comments available.  Procedures:  No procedures performed Allergies: Penicillins, Sulfa antibiotics, Amlodipine, and Lisinopril   Assessment / Plan:     Visit Diagnoses: No diagnosis found.  Orders: No orders of the defined types were placed in this encounter.  No orders of the defined types were placed in this encounter.   Face-to-face time spent with patient was ***  minutes. Greater than 50% of time was spent in counseling and coordination of care.  Follow-Up Instructions: No follow-ups on file.   Earnestine Mealing, CMA  Note - This record has been created using Editor, commissioning.  Chart creation errors have been sought, but may not always  have been located. Such creation errors do not reflect on  the standard of medical care.

## 2022-01-19 ENCOUNTER — Ambulatory Visit: Payer: Medicare HMO | Admitting: Rheumatology

## 2022-01-21 ENCOUNTER — Telehealth: Payer: Self-pay

## 2022-01-21 NOTE — Telephone Encounter (Signed)
Patient advised that patient assistance application was approved through end of year. Approval received through fax.

## 2022-01-26 ENCOUNTER — Ambulatory Visit: Payer: Medicare HMO | Admitting: Physician Assistant

## 2022-02-10 ENCOUNTER — Ambulatory Visit (HOSPITAL_BASED_OUTPATIENT_CLINIC_OR_DEPARTMENT_OTHER)
Admission: RE | Admit: 2022-02-10 | Discharge: 2022-02-10 | Disposition: A | Discharge status: Home / Self Care | Payer: HMO | Source: Ambulatory Visit | Attending: Medical | Admitting: Medical

## 2022-02-10 ENCOUNTER — Ambulatory Visit (INDEPENDENT_AMBULATORY_CARE_PROVIDER_SITE_OTHER): Payer: HMO | Admitting: Medical

## 2022-02-10 ENCOUNTER — Other Ambulatory Visit: Payer: Self-pay

## 2022-02-10 VITALS — BP 124/60 | HR 93 | Temp 97.6°F | Resp 19 | Ht 61.0 in | Wt 196.6 lb

## 2022-02-10 DIAGNOSIS — M25541 Pain in joints of right hand: Secondary | ICD-10-CM | POA: Diagnosis not present

## 2022-02-10 DIAGNOSIS — J301 Allergic rhinitis due to pollen: Secondary | ICD-10-CM | POA: Diagnosis not present

## 2022-02-10 DIAGNOSIS — M792 Neuralgia and neuritis, unspecified: Secondary | ICD-10-CM | POA: Insufficient documentation

## 2022-02-10 DIAGNOSIS — M542 Cervicalgia: Secondary | ICD-10-CM

## 2022-02-10 DIAGNOSIS — R768 Other specified abnormal immunological findings in serum: Secondary | ICD-10-CM

## 2022-02-10 DIAGNOSIS — M25542 Pain in joints of left hand: Secondary | ICD-10-CM

## 2022-02-10 DIAGNOSIS — M47812 Spondylosis without myelopathy or radiculopathy, cervical region: Secondary | ICD-10-CM | POA: Diagnosis not present

## 2022-02-10 LAB — SEDIMENTATION RATE: Sed Rate: 28 mm/hr (ref 0–30)

## 2022-02-10 LAB — C-REACTIVE PROTEIN: CRP: <1 mg/dL (ref 0.5–20.0)

## 2022-02-10 MED ORDER — DULOXETINE HCL 30 MG PO CPEP: 30.0000 mg | ORAL_CAPSULE | Freq: Every day | ORAL | 3 refills | Status: DC

## 2022-02-10 MED ORDER — FLUTICASONE PROPIONATE 50 MCG/ACT NA SUSP: 2.0000 | Freq: Every day | NASAL | 1 refills | Status: DC

## 2022-02-10 NOTE — Patient Instructions (Addendum)
Recent bilateral hand pain worse on the right side.  Intermittent tingling of fingers and notes this was associated with some neck pain and radicular pain to right side.  Patient has known osteoarthritis of both hands with some rheumatoid arthritis type findings as well.  See MRI report of right hand. ? ?We will get cervical spine x-ray today.  See if significant osteoarthritis findings found.  Unfortunately patient has known decreased GFR and is diabetic.  Use of NSAIDs and prednisone not recommended  although I  think probably would help her symptoms.  Also on moderate high-dose gabapentin and failed tramado in pastl.  I think it is worth trying the Cymbalta 30 mg daily to see if this helps with pain.  Recommend using warm compress to right hand as discussed if pain were to flare. ? ? ?Recent allergic rhinitis signs and symptoms.  Failed various over-the-counter antihistamines.  Presently recommend and prescribed Flonase nasal spray.  Might add on Xyzal antihistamine but will hold off presently as I want to evaluate if you have any side effects with Cymbalta. ?Note Xyzal antihistamine can cause sedation.  If you were to prescribe that would advise using at night. ? ?We will get inflammatory lab studies today. ? ?Follow-up in 10 days or sooner if needed. ?

## 2022-02-10 NOTE — Progress Notes (Signed)
? ?Subjective:  ? ? Patient ID: Donna Fuller, female    DOB: Oct 20, 1941, 81 y.o.   MRN: [id number] ? ?HPI ? ?Pt in for evaluation. Pt had some recent bilateral hand pain. Pain described as being on dorsal aspect of hand, wrist and foearms. Pt states pain was obvious on Friday Sunday.  ? ?Pt states she called after hours and was advised to warm up a towel and then wrap forearms and hands. Helped minimally and briefly. Heat was not maintained.  ? ?Rt side hand pain worse. Some pain that radiates from neck down to her hands. Some pain in her hands that radiate upwards. Rt hand fingers tingle.  ? ?Pt notes some neck pain that radiates down to rt hand ? ?Xray of rt hand shows  ? ?Impression: These findings are consistent with osteoarthritis.  The cystic  ?versus erosive change at the base of the fifth phalanx raises the concern  ?about inflammatory arthritis. ? ? ? ?Mri hand showed.11-26-21 ? ?IMPRESSION: ?Intercarpal and MCP joint predominant arthritis of the wrist and ?hand, with evidence of erosive change at the second and fifth ?metacarpophalangeal joints and intercarpal joints. Slight volar ?subluxation and ulnar deviation at the second MCP joint and volar ?subluxation at the small finger PIP joint. Mild tenosynovitis of ?extensor compartment 2. Overall findings are suggestive of ?inflammatory arthritis such as rheumatoid, recommend correlation ?with serologic markers. ?  ?Central tear of the TFCC articular disc with associated trace DRUJ ?effusion. ? ?Left side xray. ? ?Impression: These findings are consistent with osteoarthritis.  Cystic  ?changes were noted in the MCP joints which could be consistent with  ?inflammatory arthritis. ? ? ?Known decreased gfr. ? ?Pt has been on gabapentin 300 mg tid. States occasionally she will use extra tab at night.  On Sunday when had increased pain did not help. Pt states on Sunday when had severe pain level of pain was 10. ? ?Pt failed tramadol in the past. States also may  have causes sycope 2 years ago. She borrowed siseter tablet.  ? ?Review of Systems  ?Constitutional:  Negative for chills, diaphoresis, fatigue and fever.  ?HENT:  Positive for congestion.   ?     Sneezing with pnd and nasal congestion over past 10 days when pollen fell.pt has failed zyrtec, claritin and claritin.  ?Respiratory:  Negative for cough, chest tightness, shortness of breath and wheezing.   ?Cardiovascular:  Negative for chest pain and palpitations.  ?Gastrointestinal:  Negative for abdominal pain and blood in stool.  ?Musculoskeletal:   ?     See hpi.  ?Skin:  Negative for rash.  ? ?Past Medical History:  ?Diagnosis Date  ? Arthritis   ? Breast cancer ([place]) 2007  ? Right breast, s/p mastectomy  ? Diabetes mellitus without complication ([place])   ? GERD (gastroesophageal reflux disease)   ? Gout   ? Hyperlipidemia   ? Hypertension   ? Spinal stenosis   ? ?  ?Social History  ? ?Socioeconomic History  ? Marital status: Married  ?  Spouse name: [name]  ? Number of children: 3  ? Years of education: 4  ? Highest education level: Not on file  ?Occupational History  ? Not on file  ?Tobacco Use  ? Smoking status: Former  ?  Years: 5.00  ?  Types: Cigarettes  ? Smokeless tobacco: Never  ?Vaping Use  ? Vaping Use: Never used  ?Substance and Sexual Activity  ? Alcohol use: No  ? Drug use: No  ?  Sexual activity: Not Currently  ?Other Topics Concern  ? Not on file  ?Social History Narrative  ? Marital Status:  Married Manufacturing systems engineer)   ? Children:  Daughter(1) Son (1)   ? Pets:  None   ? Living Situation: Lives with husband and daughter.    ? Occupation:  Retired Licensed conveyancer)   ? Education: 12th Grade   ? Tobacco Use/Exposure:  She used to smoke socially during the weekends but quit 40 years ago.   ? Alcohol Use:  Occasional  ? Drug Use:  None  ? Diet:  Regular  ? Exercise:  None  ? Hobbies:  Reading and playing volleyball   ?   ?   ?   ? ?Social Determinants of Health  ? ?Financial Resource Strain: Low Risk   ? Difficulty of Paying  Living Expenses: Not hard at all  ?Food Insecurity: No Food Insecurity  ? Worried About Charity fundraiser in the Last Year: Never true  ? Ran Out of Food in the Last Year: Never true  ?Transportation Needs: No Transportation Needs  ? Lack of Transportation (Medical): No  ? Lack of Transportation (Non-Medical): No  ?Physical Activity: Inactive  ? Days of Exercise per Week: 0 days  ? Minutes of Exercise per Session: 0 min  ?Stress: No Stress Concern Present  ? Feeling of Stress : Not at all  ?Social Connections: Moderately Integrated  ? Frequency of Communication with Friends and Family: More than three times a week  ? Frequency of Social Gatherings with Friends and Family: More than three times a week  ? Attends Religious Services: More than 4 times per year  ? Active Member of Clubs or Organizations: No  ? Attends Archivist Meetings: Never  ? Marital Status: Married  ?Intimate Partner Violence: Not At Risk  ? Fear of Current or Ex-Partner: No  ? Emotionally Abused: No  ? Physically Abused: No  ? Sexually Abused: No  ? ? ?Past Surgical History:  ?Procedure Laterality Date  ? ABDOMINAL HYSTERECTOMY    ? BREAST BIOPSY Left   ? 2016 benign   ? LEFT HEART CATH AND CORONARY ANGIOGRAPHY N/A 11/21/2021  ? Procedure: LEFT HEART CATH AND CORONARY ANGIOGRAPHY;  Surgeon: Wellington Hampshire, MD;  Location: [facility];  Service: Cardiovascular;  Laterality: N/A;  ? MASTECTOMY    ? right side  ? REPLACEMENT TOTAL KNEE Bilateral   ? TUBAL LIGATION    ? ? ?Family History  ?Problem Relation Age of Onset  ? Diabetes Mother   ? Alzheimer's disease Mother   ? Hypertension Mother   ? Hypertension Father   ? Heart disease Father   ? Hyperlipidemia Father   ? Kidney disease Father   ? Diabetes Sister   ? Cancer Brother   ?     lung  ? Alzheimer's disease Brother   ? Heart disease Brother   ? Hypertension Brother   ? Diabetes Brother   ? Hypertension Brother   ? Diabetes Brother   ? Hyperlipidemia Son   ? Hypertension  Son   ? Diabetes Son   ? Hypertension Daughter   ? Hyperlipidemia Daughter   ? ? ?Allergies  ?Allergen Reactions  ? Penicillins Swelling  ? Sulfa Antibiotics Swelling  ? Amlodipine Swelling  ?  LE Swelling  ? Lisinopril Cough  ? ? ?Current Outpatient Medications on File Prior to Visit  ?Medication Sig Dispense Refill  ? acetaminophen (TYLENOL) 650 MG CR tablet Take  650 mg by mouth 2 (two) times daily.    ? ascorbic acid (VITAMIN C) 500 MG tablet Take 500 mg by mouth daily.    ? aspirin 81 MG tablet Take 81 mg by mouth daily.    ? Biotin 1000 MCG CHEW Chew by mouth.    ? blood glucose meter kit and supplies KIT Dispense based on patient and insurance preference. Use up to four times daily as directed. (FOR ICD-9 250.00, 250.01). 1 each 11  ? brimonidine (ALPHAGAN) 0.2 % ophthalmic solution Place 1 drop into both eyes 2 (two) times a day.    ? Cholecalciferol (VITAMIN D-3) 5000 units TABS Take 1 tablet by mouth daily.    ? doxycycline (VIBRA-TABS) 100 MG tablet Take 1 tablet (100 mg total) by mouth 2 (two) times daily. 20 tablet 0  ? Dulaglutide (TRULICITY) 3 ES/9.2ZR SOPN Inject 3 mg as directed once a week. 6 mL 3  ? famotidine (PEPCID) 20 MG tablet Take 1 tablet (20 mg total) by mouth daily. 30 tablet 3  ? gabapentin (NEURONTIN) 300 MG capsule TAKE 1 CAPSULE BY MOUTH THREE TIMES DAILY ,  MAY  TAKE  AN  ADDITIONAL  CAPSULE  IN  THE  EVENING (Patient taking differently: Take 300 mg by mouth See admin instructions. Take 362m three times daily, may take an additional 3060min the evening.) 100 capsule 0  ? glipiZIDE (GLUCOTROL) 5 MG tablet Take 1 tablet (5 mg total) by mouth daily before breakfast AND 0.5 tablets (2.5 mg total) daily before supper. 135 tablet 3  ? glucose blood (ONE TOUCH ULTRA TEST) test strip USE AS DIRECTED TO CHECK BLOOD SUGAR 2-3 TIMES A DAY Pt will need an OV for more refills 100 each 0  ? ketorolac (ACULAR) 0.5 % ophthalmic solution INSTILL 1 DROP IN RIGHT EYE FOUR TIMES DAILY 5 mL 6  ?  latanoprost (XALATAN) 0.005 % ophthalmic solution Place 1 drop into both eyes daily.    ? losartan (COZAAR) 50 MG tablet Take 50 mg by mouth daily.    ? Magnesium 250 MG TABS Take 1 tablet by mouth daily.

## 2022-02-12 ENCOUNTER — Other Ambulatory Visit: Payer: Self-pay

## 2022-02-12 ENCOUNTER — Telehealth: Payer: Self-pay

## 2022-02-12 ENCOUNTER — Encounter (INDEPENDENT_AMBULATORY_CARE_PROVIDER_SITE_OTHER): Payer: Self-pay | Admitting: Ophthalmology

## 2022-02-12 ENCOUNTER — Ambulatory Visit (INDEPENDENT_AMBULATORY_CARE_PROVIDER_SITE_OTHER): Payer: HMO | Admitting: Ophthalmology

## 2022-02-12 DIAGNOSIS — E113392 Type 2 diabetes mellitus with moderate nonproliferative diabetic retinopathy without macular edema, left eye: Secondary | ICD-10-CM | POA: Diagnosis not present

## 2022-02-12 DIAGNOSIS — H35351 Cystoid macular degeneration, right eye: Secondary | ICD-10-CM | POA: Diagnosis not present

## 2022-02-12 LAB — ANTI-NUCLEAR AB-TITER (ANA TITER)
ANA TITER: 1:40 {titer} — ABNORMAL HIGH
ANA Titer 1: 1:40 {titer} — ABNORMAL HIGH

## 2022-02-12 LAB — RHEUMATOID FACTOR: Rheumatoid fact SerPl-aCnc: 28 IU/mL — ABNORMAL HIGH (ref <14)

## 2022-02-12 LAB — ANA: Anti Nuclear Antibody (ANA): POSITIVE — AB

## 2022-02-12 NOTE — Telephone Encounter (Signed)
Patient called stating she was returning a call regarding scheduling an appointment.  Patient states she hasn't started the new medication and thought she was suppose to start it before scheduling another appointment.  Please advise.   ?

## 2022-02-12 NOTE — Progress Notes (Signed)
02/12/2022     CHIEF COMPLAINT Patient presents for  Chief Complaint  Patient presents with   Retina Evaluation      HISTORY OF PRESENT ILLNESS: Donna Fuller is a 81 y.o. female who presents to the clinic today for: Follow-up of previous aphakic condition 2019 requiring surgical intervention with vitrectomy, Gore-Tex suture fixation of all PMMA lens OD.    HPI   No interval change in patient's vision, patient continues on topical ketorolac use right eye 3 to times daily  History of a aphakia OD with no capsular support requiring pseudophakic creation via posterior chamber intraocular lens with suture Gore-Tex fixation to the ciliary body, Last edited by Edmon Crape, MD on 02/12/2022 11:16 AM.      Referring physician: Mat Carne, DO [address] [place],  [place] [address]  HISTORICAL INFORMATION:   Selected notes from the MEDICAL RECORD NUMBER    Lab Results  Component Value Date   HGBA1C 6.0 (A) 10/16/2021     CURRENT MEDICATIONS: Current Outpatient Medications (Ophthalmic Drugs)  Medication Sig   brimonidine (ALPHAGAN) 0.2 % ophthalmic solution Place 1 drop into both eyes 2 (two) times a day.   ketorolac (ACULAR) 0.5 % ophthalmic solution INSTILL 1 DROP IN RIGHT EYE FOUR TIMES DAILY   latanoprost (XALATAN) 0.005 % ophthalmic solution Place 1 drop into both eyes daily.   timolol (TIMOPTIC) 0.5 % ophthalmic solution Place 1 drop into both eyes 2 (two) times a day.   No current facility-administered medications for this visit. (Ophthalmic Drugs)   Current Outpatient Medications (Other)  Medication Sig   acetaminophen (TYLENOL) 650 MG CR tablet Take 650 mg by mouth 2 (two) times daily.   ascorbic acid (VITAMIN C) 500 MG tablet Take 500 mg by mouth daily.   aspirin 81 MG tablet Take 81 mg by mouth daily.   Biotin 1000 MCG CHEW Chew by mouth.   blood glucose meter kit and supplies KIT Dispense based on patient and insurance preference. Use up to four  times daily as directed. (FOR ICD-9 250.00, 250.01).   Cholecalciferol (VITAMIN D-3) 5000 units TABS Take 1 tablet by mouth daily.   doxycycline (VIBRA-TABS) 100 MG tablet Take 1 tablet (100 mg total) by mouth 2 (two) times daily.   Dulaglutide (TRULICITY) 3 MG/0.5ML SOPN Inject 3 mg as directed once a week.   DULoxetine (CYMBALTA) 30 MG capsule Take 1 capsule (30 mg total) by mouth daily.   famotidine (PEPCID) 20 MG tablet Take 1 tablet (20 mg total) by mouth daily.   fluticasone (FLONASE) 50 MCG/ACT nasal spray Place 2 sprays into both nostrils daily.   gabapentin (NEURONTIN) 300 MG capsule TAKE 1 CAPSULE BY MOUTH THREE TIMES DAILY ,  MAY  TAKE  AN  ADDITIONAL  CAPSULE  IN  THE  EVENING (Patient taking differently: Take 300 mg by mouth See admin instructions. Take 300mg  three times daily, may take an additional 300mg  in the evening.)   glipiZIDE (GLUCOTROL) 5 MG tablet Take 1 tablet (5 mg total) by mouth daily before breakfast AND 0.5 tablets (2.5 mg total) daily before supper.   glucose blood (ONE TOUCH ULTRA TEST) test strip USE AS DIRECTED TO CHECK BLOOD SUGAR 2-3 TIMES A DAY Pt will need an OV for more refills   losartan (COZAAR) 50 MG tablet Take 50 mg by mouth daily.   Magnesium 250 MG TABS Take 1 tablet by mouth daily.   metoprolol succinate (TOPROL-XL) 25 MG 24 hr  tablet Take 25 mg by mouth daily.   montelukast (SINGULAIR) 10 MG tablet Take 10 mg by mouth at bedtime.   omeprazole (PRILOSEC) 40 MG capsule Take 1 capsule (40 mg total) by mouth daily. Must keep appt on 12/06/16 with Dr. Lendon Ka Lancets 33G MISC Use as instructed to check blood sugar 2-3 times a day.  DX E11.9   pioglitazone (ACTOS) 15 MG tablet Take 1 tablet (15 mg total) by mouth daily.   pravastatin (PRAVACHOL) 20 MG tablet TAKE 1 TABLET BY MOUTH ONCE DAILY (Patient taking differently: Take 20 mg by mouth daily.)   zinc gluconate 50 MG tablet Take 50 mg by mouth daily.   No current  facility-administered medications for this visit. (Other)      REVIEW OF SYSTEMS: ROS   Negative for: Constitutional, Gastrointestinal, Neurological, Skin, Genitourinary, Musculoskeletal, HENT, Endocrine, Cardiovascular, Eyes, Respiratory, Psychiatric, Allergic/Imm, Heme/Lymph Last edited by Edmon Crape, MD on 02/12/2022 11:16 AM.       ALLERGIES Allergies  Allergen Reactions   Penicillins Swelling   Sulfa Antibiotics Swelling   Amlodipine Swelling    LE Swelling   Lisinopril Cough    PAST MEDICAL HISTORY Past Medical History:  Diagnosis Date   Arthritis    Breast cancer (HCC) 2007   Right breast, s/p mastectomy   Diabetes mellitus without complication (HCC)    GERD (gastroesophageal reflux disease)    Gout    Hyperlipidemia    Hypertension    Spinal stenosis    Past Surgical History:  Procedure Laterality Date   ABDOMINAL HYSTERECTOMY     BREAST BIOPSY Left    2016 benign    LEFT HEART CATH AND CORONARY ANGIOGRAPHY N/A 11/21/2021   Procedure: LEFT HEART CATH AND CORONARY ANGIOGRAPHY;  Surgeon: Iran Ouch, MD;  Location: [facility];  Service: Cardiovascular;  Laterality: N/A;   MASTECTOMY     right side   REPLACEMENT TOTAL KNEE Bilateral    TUBAL LIGATION      FAMILY HISTORY Family History  Problem Relation Age of Onset   Diabetes Mother    Alzheimer's disease Mother    Hypertension Mother    Hypertension Father    Heart disease Father    Hyperlipidemia Father    Kidney disease Father    Diabetes Sister    Cancer Brother        lung   Alzheimer's disease Brother    Heart disease Brother    Hypertension Brother    Diabetes Brother    Hypertension Brother    Diabetes Brother    Hyperlipidemia Son    Hypertension Son    Diabetes Son    Hypertension Daughter    Hyperlipidemia Daughter     SOCIAL HISTORY Social History   Tobacco Use   Smoking status: Former    Years: 5.00    Types: Cigarettes   Smokeless tobacco: Never   Vaping Use   Vaping Use: Never used  Substance Use Topics   Alcohol use: No   Drug use: No         OPHTHALMIC EXAM:  Base Eye Exam     Visual Acuity (ETDRS)       Right Left   Dist [place] 20/40 20/30   Dist ph [place] 20/30 20/25         Tonometry (Tonopen, 11:13 AM)       Right Left   Pressure 18 12  Pupils       Pupils APD   Right PERRL None   Left PERRL None         Visual Fields       Left Right    Full Full         Extraocular Movement       Right Left    Full, Ortho Full, Ortho         Neuro/Psych     Oriented x3: Yes   Mood/Affect: Normal         Dilation     Both eyes: 1.0% Mydriacyl, 2.5% Phenylephrine @ 11:13 AM           Slit Lamp and Fundus Exam     External Exam       Right Left   External Normal Normal         Slit Lamp Exam       Right Left   Lids/Lashes Normal Normal   Conjunctiva/Sclera White and quiet White and quiet   Cornea Clear Clear   Anterior Chamber Deep and quiet Deep and quiet   Iris Round and reactive Round and reactive   Lens Centered posterior chamber intraocular lens, AKREOS,, well centered, no chafe, no tilt Centered posterior chamber intraocular lens   Anterior Vitreous Normal Normal         Fundus Exam       Right Left   Posterior Vitreous Clear avitric Clear avitric   Disc Normal Normal   C/D Ratio 0.6 0.6   Macula Normal Normal   Vessels Normal Normal   Periphery Normal Normal            IMAGING AND PROCEDURES  Imaging and Procedures for 02/12/22  OCT, Retina - OU - Both Eyes       Right Eye Quality was good. Scan locations included subfoveal. Central Foveal Thickness: 231. Progression has been stable.   Left Eye Quality was good. Scan locations included subfoveal. Central Foveal Thickness: 242. Progression has been stable.   Notes History of pseudophakic CME likely Gore-Tex suture fixation related OD, now resolved on continuous use of topical NSAID OD  vastly improved as compared to the massive CME present 04-18-2019, maintained on topical ketorolac 3 times daily OD  No active maculopathy OU             ASSESSMENT/PLAN:  Cystoid macular edema, right eye Onset and severe CME 04-18-2019, resolved now post ongoing use of chronic topical ketorolac, 1 drop right eye 3 times daily.       ICD-10-CM   1. Cystoid macular edema, right eye  H35.351 OCT, Retina - OU - Both Eyes    2. Moderate nonproliferative diabetic retinopathy of left eye without macular edema associated with type 2 diabetes mellitus (HCC)  E11.3392 OCT, Retina - OU - Both Eyes      1.  OD, pseudophakia reestablished with all PMMA lens with suture Gore-Tex fixation to the ciliary body , 2019.  2.  OD with excellent visual acuity, doing very well.  Patient does have the need for ongoing use of ketorolac 1 drop right eye 3 times daily to prevent recurrence of CME which developed late onset post surgical correction of aphakic condition.    3.  Patient delighted that with excellent acuity  Ophthalmic Meds Ordered this visit:  No orders of the defined types were placed in this encounter.      Return in about 1 year (around 02/13/2023) for DILATE OU,  OCT.  There are no Patient Instructions on file for this visit.   Explained the diagnoses, plan, and follow up with the patient and they expressed understanding.  Patient expressed understanding of the importance of proper follow up care.   Alford [place] Antonique Langford M.D. Diseases & Surgery of the Retina and Vitreous [facility] 02/12/22     Abbreviations: M myopia (nearsighted); A astigmatism; H hyperopia (farsighted); P presbyopia; Mrx spectacle prescription;  CTL contact lenses; OD right eye; OS left eye; OU both eyes  XT exotropia; ET esotropia; PEK punctate epithelial keratitis; PEE punctate epithelial erosions; DES dry eye syndrome; MGD meibomian gland dysfunction; ATs artificial tears; PFAT's  preservative free artificial tears; NSC nuclear sclerotic cataract; PSC posterior subcapsular cataract; ERM epi-retinal membrane; PVD posterior vitreous detachment; RD retinal detachment; DM diabetes mellitus; DR diabetic retinopathy; NPDR non-proliferative diabetic retinopathy; PDR proliferative diabetic retinopathy; CSME clinically significant macular edema; DME diabetic macular edema; dbh dot blot hemorrhages; CWS cotton wool spot; POAG primary open angle glaucoma; C/D cup-to-disc ratio; HVF humphrey visual field; GVF goldmann visual field; OCT optical coherence tomography; IOP intraocular pressure; BRVO Branch retinal vein occlusion; CRVO central retinal vein occlusion; CRAO central retinal artery occlusion; BRAO branch retinal artery occlusion; RT retinal tear; SB scleral buckle; PPV pars plana vitrectomy; VH Vitreous hemorrhage; PRP panretinal laser photocoagulation; IVK intravitreal kenalog; VMT vitreomacular traction; MH Macular hole;  NVD neovascularization of the disc; NVE neovascularization elsewhere; AREDS age related eye disease study; ARMD age related macular degeneration; POAG primary open angle glaucoma; EBMD epithelial/anterior basement membrane dystrophy; ACIOL anterior chamber intraocular lens; IOL intraocular lens; PCIOL posterior chamber intraocular lens; Phaco/IOL phacoemulsification with intraocular lens placement; PRK photorefractive keratectomy; LASIK laser assisted in situ keratomileusis; HTN hypertension; DM diabetes mellitus; COPD chronic obstructive pulmonary disease

## 2022-02-12 NOTE — Assessment & Plan Note (Addendum)
Onset and severe CME 04-18-2019, resolved now post ongoing use of chronic topical ketorolac, 1 drop right eye 3 times daily. ? ? ?

## 2022-02-12 NOTE — Addendum Note (Signed)
Addended by: Anabel Halon on: 02/12/2022 10:01 AM ? ? Modules accepted: Orders ? ?

## 2022-02-19 NOTE — Progress Notes (Signed)
? ?Office Visit Note ? ?Patient: Donna Fuller             ?Date of Birth: [birth year]           ?MRN: [id number]             ?PCP: Mackie Pai, PA-C ?Referring: Mackie Pai, PA-C ?Visit Date: 03/05/2022 ?Occupation: '@GUAROCC'$ @ ? ?Subjective:  ?Left knee pain ? ?History of Present Illness: Donna Fuller is a 81 y.o. female with history of rheumatoid arthritis and osteoarthritis overlap.  She states she continues to have some discomfort in her left knee joint which is replaced.  Her hands are stiff and she has difficulty holding objects.  She has not noticed any joint swelling.  She decided not to take any medications which will suppress her immune system as she is concerned about the side effects of the medication and increased risk of infection. ? ?Activities of Daily Living:  ?Patient reports morning stiffness for several hours.   ?Patient Reports nocturnal pain.  ?Difficulty dressing/grooming: Reports ?Difficulty climbing stairs: Reports ?Difficulty getting out of chair: Reports ?Difficulty using hands for taps, buttons, cutlery, and/or writing: Reports ? ?Review of Systems  ?Constitutional:  Positive for fatigue.  ?HENT:  Positive for mouth dryness and nose dryness. Negative for mouth sores.   ?Eyes:  Positive for pain, itching and dryness.  ?Respiratory:  Negative for shortness of breath and difficulty breathing.   ?Cardiovascular:  Negative for chest pain and palpitations.  ?Gastrointestinal:  Negative for blood in stool, constipation and diarrhea.  ?Endocrine: Negative for increased urination.  ?Genitourinary:  Negative for difficulty urinating.  ?Musculoskeletal:  Positive for joint pain, joint pain, joint swelling, myalgias, morning stiffness, muscle tenderness and myalgias.  ?Skin:  Negative for color change, rash and redness.  ?Allergic/Immunologic: Positive for susceptible to infections.  ?Neurological:  Positive for numbness, headaches and weakness. Negative for dizziness and memory loss.   ?Hematological:  Positive for bruising/bleeding tendency.  ?Psychiatric/Behavioral:  Negative for confusion.   ? ?PMFS History:  ?Patient Active Problem List  ? Diagnosis Date Noted  ? Angina pectoris ([place]) 11/21/2021  ? Diabetes mellitus type 2 with complications ([place]) 56/38/9373  ? CAD (coronary artery disease) 11/21/2021  ? LV hypertrophy, hypertensive 11/21/2021  ? Elevated troponin 11/21/2021  ? Atypical chest pain 11/20/2021  ? DDD (degenerative disc disease), lumbar 11/03/2021  ? Rheumatoid factor positive 07/29/2021  ? History of total bilateral knee replacement 07/29/2021  ? Other idiopathic scoliosis, thoracic region 07/08/2021  ? Estrogen deficiency 07/08/2021  ? Arthralgia of both hands 07/08/2021  ? Moderate nonproliferative diabetic retinopathy of left eye ([place]) 02/11/2021  ? Pseudophakia 02/11/2021  ? Primary open angle glaucoma of both eyes, moderate stage 02/11/2021  ? Cystoid macular edema, right eye 02/11/2021  ? Type 2 diabetes mellitus with hyperglycemia, without long-term current use of insulin ([place]) 12/14/2019  ? Type 2 diabetes mellitus with diabetic polyneuropathy, without long-term current use of insulin ([place]) 12/14/2019  ? Pleuritic chest pain 05/30/2019  ? Fever 05/30/2019  ? Yeast vaginitis 07/19/2017  ? Urinary frequency 07/19/2017  ? CKD (chronic kidney disease) stage 3, GFR 30-59 ml/min (HCC) 05/20/2017  ? Gout 05/20/2017  ? Hyperlipidemia 09/26/2016  ? Spinal stenosis 09/26/2016  ? History of breast cancer 09/26/2016  ? Diabetes mellitus type 2, controlled ([place]) 09/26/2016  ? GERD (gastroesophageal reflux disease) 09/26/2016  ? Facet arthropathy, lumbosacral 03/13/2015  ? Neuropathy 03/13/2015  ? Hypertension   ? Lumbar radiculopathy 01/08/2014  ?  ?Past Medical  History:  ?Diagnosis Date  ? Arthritis   ? Breast cancer ([place]) 2007  ? Right breast, s/p mastectomy  ? Diabetes mellitus without complication ([place])   ? GERD (gastroesophageal reflux disease)   ? Gout   ? Hyperlipidemia   ?  Hypertension   ? Spinal stenosis   ?  ?Family History  ?Problem Relation Age of Onset  ? Diabetes Mother   ? Alzheimer's disease Mother   ? Hypertension Mother   ? Hypertension Father   ? Heart disease Father   ? Hyperlipidemia Father   ? Kidney disease Father   ? Diabetes Sister   ? Cancer Brother   ?     lung  ? Alzheimer's disease Brother   ? Heart disease Brother   ? Hypertension Brother   ? Diabetes Brother   ? Hypertension Brother   ? Diabetes Brother   ? Hyperlipidemia Son   ? Hypertension Son   ? Diabetes Son   ? Hypertension Daughter   ? Hyperlipidemia Daughter   ? ?Past Surgical History:  ?Procedure Laterality Date  ? ABDOMINAL HYSTERECTOMY    ? BREAST BIOPSY Left   ? 2016 benign   ? LEFT HEART CATH AND CORONARY ANGIOGRAPHY N/A 11/21/2021  ? Procedure: LEFT HEART CATH AND CORONARY ANGIOGRAPHY;  Surgeon: Wellington Hampshire, MD;  Location: [facility];  Service: Cardiovascular;  Laterality: N/A;  ? MASTECTOMY    ? right side  ? REPLACEMENT TOTAL KNEE Bilateral   ? TUBAL LIGATION    ? ?Social History  ? ?Social History Narrative  ? Marital Status:  Married Manufacturing systems engineer)   ? Children:  Daughter(1) Son (1)   ? Pets:  None   ? Living Situation: Lives with husband and daughter.    ? Occupation:  Retired Licensed conveyancer)   ? Education: 12th Grade   ? Tobacco Use/Exposure:  She used to smoke socially during the weekends but quit 40 years ago.   ? Alcohol Use:  Occasional  ? Drug Use:  None  ? Diet:  Regular  ? Exercise:  None  ? Hobbies:  Reading and playing volleyball   ?   ?   ?   ? ?Immunization History  ?Administered Date(s) Administered  ? Influenza,inj,Quad PF,6+ Mos 08/30/2016  ? Influenza-Unspecified 08/18/2021  ? Moderna Covid-19 Vaccine Bivalent Booster 85yr & up 08/29/2021  ? Moderna Sars-Covid-2 Vaccination 12/25/2019, 01/11/2020, 02/05/2020  ? Pneumococcal Conjugate-13 05/22/2015  ? Pneumococcal Polysaccharide-23 10/06/2021  ?  ? ?Objective: ?Vital Signs: BP 105/69 (BP Location: Left Arm, Patient Position:  Sitting, Cuff Size: Large)   Pulse 66   Ht '5\' 1"'$  (1.549 m)   Wt 195 lb 12.8 oz (88.8 kg)   BMI 37.00 kg/m?   ? ?Physical Exam ?Vitals and nursing note reviewed.  ?Constitutional:   ?   Appearance: She is well-developed.  ?HENT:  ?   Head: Normocephalic and atraumatic.  ?Eyes:  ?   Conjunctiva/sclera: Conjunctivae normal.  ?Cardiovascular:  ?   Rate and Rhythm: Normal rate and regular rhythm.  ?   Heart sounds: Normal heart sounds.  ?Pulmonary:  ?   Effort: Pulmonary effort is normal.  ?   Breath sounds: Normal breath sounds.  ?Abdominal:  ?   General: Bowel sounds are normal.  ?   Palpations: Abdomen is soft.  ?Musculoskeletal:  ?   Cervical back: Normal range of motion.  ?Lymphadenopathy:  ?   Cervical: No cervical adenopathy.  ?Skin: ?   General: Skin is warm  and dry.  ?   Capillary Refill: Capillary refill takes less than 2 seconds.  ?Neurological:  ?   Mental Status: She is alert and oriented to person, place, and time.  ?Psychiatric:     ?   Behavior: Behavior normal.  ?  ? ?Musculoskeletal Exam: Her recall spine was in good range of motion.  She had thoracolumbar scoliosis with limited range of motion of her spine.  Shoulder joint abduction was limited to 110 degrees bilaterally with some discomfort.  Elbow joints with good range of motion.  There was no synovitis of the wrist joints.  She had bilateral PIP and DIP thickening with incomplete extension of her PIP joints.  No synovitis was noted.  Hip joints were difficult to assess in the sitting position.  Bilateral knee joints were replaced.  She had some discomfort range of motion of her left knee joint.  There was no tenderness over ankles or MTPs. ? ?CDAI Exam: ?CDAI Score: 0.4  ?Patient Global: 2 mm; Provider Global: 2 mm ?Swollen: 0 ; Tender: 0  ?Joint Exam 03/05/2022  ? ?No joint exam has been documented for this visit  ? ?There is currently no information documented on the homunculus. Go to the Rheumatology activity and complete the homunculus  joint exam. ? ?Investigation: ?No additional findings. ? ?Imaging: ?DG Cervical Spine Complete ? ?Result Date: 02/10/2022 ?CLINICAL DATA:  No recent or known injury presenting with radicular pain in bilateral cervica

## 2022-02-26 ENCOUNTER — Telehealth: Payer: Self-pay | Admitting: Medical

## 2022-02-26 NOTE — Telephone Encounter (Signed)
Pt would like medication refilled. Advised pt medication prescribed by another provider. Pt mentioned she does not see them anymore.  ? ?gabapentin (NEURONTIN) 300 MG capsule [[id number]]  ?[id number] [address] [id number] ?Phone:  [phone] Fax:  [phone] ?

## 2022-02-27 MED ORDER — GABAPENTIN 300 MG PO CAPS: 300.0000 mg | ORAL_CAPSULE | Freq: Three times a day (TID) | ORAL | 2 refills | Status: DC

## 2022-02-27 NOTE — Telephone Encounter (Signed)
Rx sent pt notified 

## 2022-02-27 NOTE — Telephone Encounter (Signed)
Patient takes it TID every day , has been on it for years for neuropathy ? ?Cymbalta did help ?

## 2022-03-02 ENCOUNTER — Other Ambulatory Visit: Payer: Self-pay | Admitting: Medical

## 2022-03-05 ENCOUNTER — Ambulatory Visit (INDEPENDENT_AMBULATORY_CARE_PROVIDER_SITE_OTHER): Payer: HMO | Admitting: Rheumatology

## 2022-03-05 ENCOUNTER — Encounter: Payer: Self-pay | Admitting: Rheumatology

## 2022-03-05 VITALS — BP 105/69 | HR 66 | Ht 61.0 in | Wt 195.8 lb

## 2022-03-05 DIAGNOSIS — M4124 Other idiopathic scoliosis, thoracic region: Secondary | ICD-10-CM

## 2022-03-05 DIAGNOSIS — Z96653 Presence of artificial knee joint, bilateral: Secondary | ICD-10-CM | POA: Diagnosis not present

## 2022-03-05 DIAGNOSIS — M059 Rheumatoid arthritis with rheumatoid factor, unspecified: Secondary | ICD-10-CM

## 2022-03-05 DIAGNOSIS — H401132 Primary open-angle glaucoma, bilateral, moderate stage: Secondary | ICD-10-CM

## 2022-03-05 DIAGNOSIS — M5136 Other intervertebral disc degeneration, lumbar region: Secondary | ICD-10-CM

## 2022-03-05 DIAGNOSIS — M7062 Trochanteric bursitis, left hip: Secondary | ICD-10-CM | POA: Diagnosis not present

## 2022-03-05 DIAGNOSIS — I10 Essential (primary) hypertension: Secondary | ICD-10-CM

## 2022-03-05 DIAGNOSIS — E1142 Type 2 diabetes mellitus with diabetic polyneuropathy: Secondary | ICD-10-CM

## 2022-03-05 DIAGNOSIS — Z8639 Personal history of other endocrine, nutritional and metabolic disease: Secondary | ICD-10-CM | POA: Diagnosis not present

## 2022-03-05 DIAGNOSIS — N1831 Chronic kidney disease, stage 3a: Secondary | ICD-10-CM

## 2022-03-05 DIAGNOSIS — E113392 Type 2 diabetes mellitus with moderate nonproliferative diabetic retinopathy without macular edema, left eye: Secondary | ICD-10-CM | POA: Diagnosis not present

## 2022-03-05 DIAGNOSIS — Z853 Personal history of malignant neoplasm of breast: Secondary | ICD-10-CM | POA: Diagnosis not present

## 2022-03-05 DIAGNOSIS — Z79899 Other long term (current) drug therapy: Secondary | ICD-10-CM

## 2022-03-05 DIAGNOSIS — K219 Gastro-esophageal reflux disease without esophagitis: Secondary | ICD-10-CM

## 2022-03-08 ENCOUNTER — Other Ambulatory Visit: Payer: Self-pay | Admitting: Medical

## 2022-03-19 ENCOUNTER — Telehealth: Payer: Self-pay | Admitting: Medical

## 2022-03-19 DIAGNOSIS — E1165 Type 2 diabetes mellitus with hyperglycemia: Secondary | ICD-10-CM

## 2022-03-19 MED ORDER — GLUCOSE BLOOD VI STRP: ORAL_STRIP | 0 refills | Status: DC

## 2022-03-19 NOTE — Telephone Encounter (Signed)
Medication: glucose blood (ONE TOUCH ULTRA TEST) test strip ? ?Has the patient contacted their pharmacy? No. ? ?Preferred Pharmacy (with phone number or street name):  ? ?[id number] [address] [id number] ?Phone:  [phone] Fax:  [phone] ?Agent: Please be advised that RX refills may take up to 3 business days. We ask that you follow-up with your pharmacy.  ?

## 2022-03-19 NOTE — Telephone Encounter (Signed)
Rx sent 

## 2022-03-24 DIAGNOSIS — N3592 Unspecified urethral stricture, female: Secondary | ICD-10-CM | POA: Diagnosis not present

## 2022-03-24 DIAGNOSIS — R3589 Other polyuria: Secondary | ICD-10-CM | POA: Diagnosis not present

## 2022-04-06 ENCOUNTER — Other Ambulatory Visit (INDEPENDENT_AMBULATORY_CARE_PROVIDER_SITE_OTHER): Payer: Self-pay | Admitting: Ophthalmology

## 2022-04-06 ENCOUNTER — Other Ambulatory Visit: Payer: Self-pay | Admitting: Medical

## 2022-04-06 DIAGNOSIS — E1165 Type 2 diabetes mellitus with hyperglycemia: Secondary | ICD-10-CM

## 2022-04-08 ENCOUNTER — Other Ambulatory Visit: Payer: Self-pay | Admitting: Medical

## 2022-04-17 ENCOUNTER — Encounter: Payer: Self-pay | Admitting: Internal Medicine

## 2022-04-17 ENCOUNTER — Ambulatory Visit (INDEPENDENT_AMBULATORY_CARE_PROVIDER_SITE_OTHER): Payer: HMO | Admitting: Internal Medicine

## 2022-04-17 VITALS — BP 122/80 | HR 64 | Ht 61.0 in | Wt 194.8 lb

## 2022-04-17 DIAGNOSIS — E119 Type 2 diabetes mellitus without complications: Secondary | ICD-10-CM | POA: Insufficient documentation

## 2022-04-17 DIAGNOSIS — N1832 Chronic kidney disease, stage 3b: Secondary | ICD-10-CM | POA: Diagnosis not present

## 2022-04-17 DIAGNOSIS — E1122 Type 2 diabetes mellitus with diabetic chronic kidney disease: Secondary | ICD-10-CM | POA: Diagnosis not present

## 2022-04-17 DIAGNOSIS — E1142 Type 2 diabetes mellitus with diabetic polyneuropathy: Secondary | ICD-10-CM | POA: Diagnosis not present

## 2022-04-17 DIAGNOSIS — R739 Hyperglycemia, unspecified: Secondary | ICD-10-CM

## 2022-04-17 LAB — POCT GLYCOSYLATED HEMOGLOBIN (HGB A1C): Hemoglobin A1C: 5.7 % — AB (ref 4.0–5.6)

## 2022-04-17 MED ORDER — GLIPIZIDE 5 MG PO TABS: ORAL_TABLET | ORAL | 3 refills | Status: DC

## 2022-04-17 NOTE — Progress Notes (Signed)
Name: Donna Fuller  Age/ Sex: 81 y.o., female   MRN/ DOB: [id number], 10-13-1941     PCP: Elise Benne   Reason for Endocrinology Evaluation: Type 2 Diabetes Mellitus  Initial Endocrine Consultative Visit: 12/14/2019    PATIENT IDENTIFIER: Donna Fuller is a 81 y.o. female with a past medical history of T2DM, HTN, RA and Dyslipidemia. The patient has followed with Endocrinology clinic since 12/14/2019 for consultative assistance with management of her diabetes.  DIABETIC HISTORY:  Donna Fuller was diagnosed with T2DM many years ago. She was on metformin due to low renal function, has been on Januvia, Farxia and insulin as well. Her hemoglobin A1c has ranged from 6.0 %in 2015, peaking at 8.8% in 2020.  On her initial visit to our clinic her A1c was 8.8%   , she was on Trulicity and pioglitazone, we added glipizide   Stopped Pioglitazone 03/2022 due to A1c 5.7 %   SUBJECTIVE:   During the last visit (10/16/2021): A1c 6.6%, We continued  Trulicity,Pioglitazone, and glipizide  Today (04/17/2022): Donna Fuller is here for follow-up on diabetes management. She checks her blood sugars 2 times daily, preprandial to breakfast and dinnertime.  The patient has not had hypoglycemic episodes since the last clinic visit.   Has occasional nausea but no vomiting or diarrhea , has heartburn   She has a lot of joint pain today , she has been Dx with RA and follows with Rheumatology  LE edema is improving    HOME DIABETES REGIMEN:  Glipizide 5 mg , 1 tablet before Breast and half before supper  Trulicity 3 mg weekly- Donna Fuller assistance  Pioglitazone 15 mg daily    METER DOWNLOAD SUMMARY: Unable to download  136-210 mg/dL    DIABETIC COMPLICATIONS: Microvascular complications:  Neuropathy  Denies: retinopathy , CKD Last eye exam: Completed 02/11/2022   Macrovascular complications:    Denies: CAD, PVD, CVA     HISTORY:  Past Medical History:  Past Medical  History:  Diagnosis Date   Arthritis    Breast cancer ([place]) 2007   Right breast, s/p mastectomy   Diabetes mellitus without complication ([facility])    GERD (gastroesophageal reflux disease)    Gout    Hyperlipidemia    Hypertension    Spinal stenosis    Past Surgical History:  Past Surgical History:  Procedure Laterality Date   ABDOMINAL HYSTERECTOMY     BREAST BIOPSY Left    2016 benign    LEFT HEART CATH AND CORONARY ANGIOGRAPHY N/A 11/21/2021   Procedure: LEFT HEART CATH AND CORONARY ANGIOGRAPHY;  Surgeon: Wellington Hampshire, MD;  Location: [facility];  Service: Cardiovascular;  Laterality: N/A;   MASTECTOMY     right side   REPLACEMENT TOTAL KNEE Bilateral    TUBAL LIGATION     Social History:  reports that she has quit smoking. Her smoking use included cigarettes. She has never been exposed to tobacco smoke. She has never used smokeless tobacco. She reports that she does not drink alcohol and does not use drugs. Family History:  Family History  Problem Relation Age of Onset   Diabetes Mother    Alzheimer's disease Mother    Hypertension Mother    Hypertension Father    Heart disease Father    Hyperlipidemia Father    Kidney disease Father    Diabetes Sister    Cancer Brother        lung   Alzheimer's disease Brother  Heart disease Brother    Hypertension Brother    Diabetes Brother    Hypertension Brother    Diabetes Brother    Hyperlipidemia Son    Hypertension Son    Diabetes Son    Hypertension Daughter    Hyperlipidemia Daughter      HOME MEDICATIONS: Allergies as of 04/17/2022       Reactions   Penicillins Swelling   Sulfa Antibiotics Swelling   Amlodipine Swelling   LE Swelling   Lisinopril Cough        Medication List        Accurate as of Apr 17, 2022 11:45 AM. If you have any questions, ask your nurse or doctor.          STOP taking these medications    doxycycline 100 MG tablet Commonly known as: VIBRA-TABS Stopped  by: Dorita Sciara, MD       TAKE these medications    acetaminophen 650 MG CR tablet Commonly known as: TYLENOL Take 650 mg by mouth 2 (two) times daily.   ascorbic acid 500 MG tablet Commonly known as: VITAMIN C Take 500 mg by mouth daily.   aspirin 81 MG tablet Take 81 mg by mouth daily.   Biotin 1000 MCG Chew Chew by mouth.   blood glucose meter kit and supplies Kit Dispense based on patient and insurance preference. Use up to four times daily as directed. (FOR ICD-9 250.00, 250.01).   brimonidine 0.2 % ophthalmic solution Commonly known as: ALPHAGAN Place 1 drop into both eyes 2 (two) times a day.   DULoxetine 30 MG capsule Commonly known as: CYMBALTA TAKE 1 CAPSULE BY MOUTH EVERY DAY   famotidine 20 MG tablet Commonly known as: PEPCID TAKE 1 TABLET BY MOUTH EVERY DAY   fluticasone 50 MCG/ACT nasal spray Commonly known as: FLONASE SPRAY 2 SPRAYS INTO EACH NOSTRIL EVERY DAY   gabapentin 300 MG capsule Commonly known as: NEURONTIN Take 1 capsule (300 mg total) by mouth 3 (three) times daily. TAKE 1 CAPSULE BY MOUTH THREE TIMES DAILY   glipiZIDE 5 MG tablet Commonly known as: GLUCOTROL Take 1 tablet (5 mg total) by mouth daily before breakfast AND 0.5 tablets (2.5 mg total) daily before supper.   ketorolac 0.5 % ophthalmic solution Commonly known as: ACULAR INSTILL 1 DROP INTO RIGHT EYE 4 TIMES A DAY   latanoprost 0.005 % ophthalmic solution Commonly known as: XALATAN Place 1 drop into both eyes daily.   losartan 50 MG tablet Commonly known as: COZAAR Take 50 mg by mouth daily.   Magnesium 250 MG Tabs Take 1 tablet by mouth daily.   metoprolol succinate 25 MG 24 hr tablet Commonly known as: TOPROL-XL Take 25 mg by mouth daily.   montelukast 10 MG tablet Commonly known as: SINGULAIR Take 10 mg by mouth at bedtime.   omeprazole 40 MG capsule Commonly known as: PRILOSEC Take 1 capsule (40 mg total) by mouth daily. Must keep appt on 12/06/16  with Dr. Ricky Ala Lancets 96G Misc Use as instructed to check blood sugar 2-3 times a day.  DX E11.9   OneTouch Ultra test strip Generic drug: glucose blood CHECK BLOOD SUGAR 2 TO 3 TIMES DAILY AS DIRECTED   pioglitazone 15 MG tablet Commonly known as: ACTOS Take 1 tablet (15 mg total) by mouth daily.   pravastatin 20 MG tablet Commonly known as: PRAVACHOL TAKE 1 TABLET BY MOUTH ONCE DAILY   timolol 0.5 % ophthalmic solution Commonly  known as: TIMOPTIC Place 1 drop into both eyes 2 (two) times a day.   Trulicity 3 PR/9.1MB Sopn Generic drug: Dulaglutide Inject 3 mg as directed once a week.   Vitamin D-3 125 MCG (5000 UT) Tabs Take 1 tablet by mouth daily.   zinc gluconate 50 MG tablet Take 50 mg by mouth daily.         OBJECTIVE:   Vital Signs: BP 122/80 (BP Location: Left Arm, Patient Position: Sitting, Cuff Size: Large)   Pulse 64   Ht $R'5\' 1"'kZ$  (1.549 m)   Wt 194 lb 12.8 oz (88.4 kg)   SpO2 98%   BMI 36.81 kg/m   Wt Readings from Last 3 Encounters:  04/17/22 194 lb 12.8 oz (88.4 kg)  03/05/22 195 lb 12.8 oz (88.8 kg)  02/10/22 196 lb 9.6 oz (89.2 kg)     Exam: General: Donna Fuller appears well and is in NAD  Lungs: Clear with good BS bilat with no rales, rhonchi, or wheezes  Heart: RRR with normal S1 and S2 and no gallops; no murmurs; no rub  Extremities: Trace  pretibial edema.  Neuro: MS is good with appropriate affect, Donna Fuller is alert and Ox3    DM foot exam:   04/17/2022  The skin of the feet is intact without sores or ulcerations. The pedal pulses are 1+ on right and 1+ on left. The sensation is intact to a screening 5.07, 10 gram monofilament bilaterally    DATA REVIEWED:  Lab Results  Component Value Date   HGBA1C 5.7 (A) 04/17/2022   HGBA1C 6.0 (A) 10/16/2021   HGBA1C 6.2 (H) 06/27/2021   Lab Results  Component Value Date   MICROALBUR 0.4 04/21/2017   LDLCALC 77 05/31/2019   CREATININE 1.37 (H) 11/26/2021     Latest  Reference Range & Units 10/06/21 14:25  Sodium 135 - 145 mEq/L 139  Potassium 3.5 - 5.1 mEq/L 4.4  Chloride 96 - 112 mEq/L 103  CO2 19 - 32 mEq/L 28  Glucose 70 - 99 mg/dL 101 (H)  BUN 6 - 23 mg/dL 26 (H)  Creatinine 0.40 - 1.20 mg/dL 1.50 (H)  Calcium 8.4 - 10.5 mg/dL 9.4  Alkaline Phosphatase 39 - 117 U/L 99  Albumin 3.5 - 5.2 g/dL 4.1  AST 0 - 37 U/L 14  ALT 0 - 35 U/L 9  Total Protein 6.0 - 8.3 g/dL 6.7  Total Bilirubin 0.2 - 1.2 mg/dL 0.4  GFR >60.00 mL/min 32.87 (L)      ASSESSMENT / PLAN / RECOMMENDATIONS:   1) Type 2 Diabetes Mellitus, optimally controlled, With neuropathic and CKD III complications - Most recent A1c of 5.7 %. Goal A1c <7.0%.   -A1c is skewed due to CKD  - She has trace edema which is stable , will stop Pioglitazone  - She is on Donna Fuller assistance for trulicity   MEDICATIONS: Stop Pioglitazone  Continue glipizide 5 mg, 1 tablet before Breakfast and half a tablet before supper Continue Trulicity 3 mg weekly  EDUCATION / INSTRUCTIONS: BG monitoring instructions: Patient is instructed to check her blood sugars 2 times a day, fasting and suppertime. Call [facility] if: BG persistently < 70  I reviewed the Rule of 15 for the treatment of hypoglycemia in detail with the patient. Literature supplied.    2) Diabetic complications:  Eye: Does not have known diabetic retinopathy.  Neuro/ Feet: Does  have known diabetic peripheral neuropathy. Renal: Patient does not have known baseline CKD. She is  on an ACEI/ARB at present.  F/U in 6 months   Signed electronically by: Mack Guise, MD  [facility]  [facility] [address] [place], [place] [address] Phone: [phone] FAX: [phone]   CC: Elise Benne [address] [place] [address] Phone: [phone]  Fax: [phone]  Return to Endocrinology clinic as below: Future Appointments  Date Time  Provider [place]  08/14/2022 10:00 AM Bo Merino, MD [facility] None  10/06/2022  1:00 PM [facility] HEALTH COACH [facility]  02/15/2023 10:30 AM Rankin, Clent Demark, MD [facility] None

## 2022-04-17 NOTE — Patient Instructions (Signed)
-   Stop Actos  -  Continue trulicity 3 mg weekly  - Continue Glipizide 5 mg , 1 tablet before Breakfast, and half a tablet before supper     - HOW TO TREAT LOW BLOOD SUGARS (Blood sugar LESS THAN 70 MG/DL) Please follow the RULE OF 15 for the treatment of hypoglycemia treatment (when your (blood sugars are less than 70 mg/dL)   STEP 1: Take 15 grams of carbohydrates when your blood sugar is low, which includes:  3-4 GLUCOSE TABS  OR 3-4 OZ OF JUICE OR REGULAR SODA OR ONE TUBE OF GLUCOSE GEL    STEP 2: RECHECK blood sugar in 15 MINUTES STEP 3: If your blood sugar is still low at the 15 minute recheck --> then, go back to STEP 1 and treat AGAIN with another 15 grams of carbohydrates.

## 2022-04-22 DIAGNOSIS — E113211 Type 2 diabetes mellitus with mild nonproliferative diabetic retinopathy with macular edema, right eye: Secondary | ICD-10-CM | POA: Diagnosis not present

## 2022-04-22 DIAGNOSIS — H04123 Dry eye syndrome of bilateral lacrimal glands: Secondary | ICD-10-CM | POA: Diagnosis not present

## 2022-04-22 DIAGNOSIS — Z961 Presence of intraocular lens: Secondary | ICD-10-CM | POA: Diagnosis not present

## 2022-04-22 DIAGNOSIS — H401132 Primary open-angle glaucoma, bilateral, moderate stage: Secondary | ICD-10-CM | POA: Diagnosis not present

## 2022-05-01 ENCOUNTER — Other Ambulatory Visit: Payer: Self-pay | Admitting: Medical

## 2022-05-04 ENCOUNTER — Telehealth: Payer: Self-pay

## 2022-05-04 NOTE — Telephone Encounter (Signed)
Patient states that she has experiencing hair loss and would like to know if she can been seen for her thyroid.

## 2022-05-04 NOTE — Telephone Encounter (Signed)
Patient offered the next appointment opening but would like to be seen sooner. Patient will contact pcp to be seen there

## 2022-05-05 ENCOUNTER — Ambulatory Visit (INDEPENDENT_AMBULATORY_CARE_PROVIDER_SITE_OTHER): Payer: HMO | Admitting: Medical

## 2022-05-05 VITALS — BP 130/64 | HR 76 | Resp 18 | Ht 61.0 in | Wt 191.0 lb

## 2022-05-05 DIAGNOSIS — R944 Abnormal results of kidney function studies: Secondary | ICD-10-CM | POA: Diagnosis not present

## 2022-05-05 DIAGNOSIS — N1832 Chronic kidney disease, stage 3b: Secondary | ICD-10-CM

## 2022-05-05 DIAGNOSIS — M5440 Lumbago with sciatica, unspecified side: Secondary | ICD-10-CM | POA: Diagnosis not present

## 2022-05-05 DIAGNOSIS — E1122 Type 2 diabetes mellitus with diabetic chronic kidney disease: Secondary | ICD-10-CM

## 2022-05-05 DIAGNOSIS — R300 Dysuria: Secondary | ICD-10-CM | POA: Diagnosis not present

## 2022-05-05 DIAGNOSIS — G8929 Other chronic pain: Secondary | ICD-10-CM

## 2022-05-05 DIAGNOSIS — R5383 Other fatigue: Secondary | ICD-10-CM | POA: Diagnosis not present

## 2022-05-05 DIAGNOSIS — M79641 Pain in right hand: Secondary | ICD-10-CM

## 2022-05-05 DIAGNOSIS — L659 Nonscarring hair loss, unspecified: Secondary | ICD-10-CM | POA: Diagnosis not present

## 2022-05-05 DIAGNOSIS — R1310 Dysphagia, unspecified: Secondary | ICD-10-CM

## 2022-05-05 LAB — POC URINALSYSI DIPSTICK (AUTOMATED)
Bilirubin, UA: NEGATIVE
Blood, UA: NEGATIVE
Glucose, UA: NEGATIVE
Ketones, UA: NEGATIVE
Nitrite, UA: NEGATIVE
Protein, UA: NEGATIVE
Spec Grav, UA: <=1.005 — AB (ref 1.010–1.025)
Urobilinogen, UA: 0.2 E.U./dL
pH, UA: 5 (ref 5.0–8.0)

## 2022-05-05 MED ORDER — CIPROFLOXACIN HCL 250 MG PO TABS: 250.0000 mg | ORAL_TABLET | Freq: Two times a day (BID) | ORAL | 0 refills | Status: AC

## 2022-05-05 MED ORDER — METHYLPREDNISOLONE 4 MG PO TABS: ORAL_TABLET | ORAL | 0 refills | Status: DC

## 2022-05-05 NOTE — Patient Instructions (Addendum)
Dysuria with recent blood clot.  Some recent suprapubic tenderness as well.  POCT urinalysis done today and will send urine for culture.  During the interim we will have you take Cipro 250 mg twice daily for 3 days.  After review of culture plan to place referral back to urologist.  With results of studies this might expedite your appointment.  Decreased GFR.  We will repeat metabolic panel follow potassium and kidney function.  Diabetes with recent A1c of 5.7.  Continue Trulicity and glipizide.  Recent fatigue-we will get CBC, B12, B1, TSH, T4 and iron level.  For recent dysphagia went ahead and placed referral to GI MD.  For hand pain right side with known arthritic changes and a left-sided sciatica, decided to give you a very low dose 3-day taper dose of Medrol.  With your low A1c average I think this is reasonable..Make sure you are eating low sugar diet while on Medrol.  For hair loss follow-up thyroid studies.  Thyroid studies negative can refer to dermatologist.  Follow-up in 3 weeks or sooner if needed.

## 2022-05-05 NOTE — Progress Notes (Unsigned)
Subjective:    Patient ID: Donna Fuller, female    DOB: 1941-05-29, 81 y.o.   MRN: [id number]  HPI  Pt ha some urinary signs/symptoms. Pt syptoms of mild lower abdomen discomfort when she urinated. Still having some discomfort. On Apr 29, 2020 when she urinated so blood clot. Pt tried to see her urologist. They did not give her appointment. [facility]. Pt never had cystoscopy.   Rt wrist pain. Hurting for one month. Pt states knows has arthitits.  Also some hair loss. When brushes she states will loose hair.  Recent choking/sensation of food getting stuck in throat.  Htn- bp well controlled. Losartan 50 mg daily. Toprol xl 25 mg daily.   Also has some recent left side  sciatica.  Last a1c was 5.7. pt on trulicity and glipizide.  Review of Systems  Constitutional:  Positive for fatigue. Negative for chills and fever.  HENT:         Dysphagia.  Respiratory:  Negative for cough, chest tightness, shortness of breath and wheezing.   Cardiovascular:  Negative for chest pain and palpitations.  Gastrointestinal:  Negative for abdominal pain, blood in stool and diarrhea.       Suprapubic pressure  Genitourinary:  Positive for dysuria and hematuria. Negative for frequency.  Musculoskeletal:  Positive for back pain.  Skin:        Hair loss.    Past Medical History:  Diagnosis Date   Arthritis    Breast cancer ([place]) 2007   Right breast, s/p mastectomy   Diabetes mellitus without complication ([facility])    GERD (gastroesophageal reflux disease)    Gout    Hyperlipidemia    Hypertension    Spinal stenosis      Social History   Socioeconomic History   Marital status: Married    Spouse name: Jimmy   Number of children: 3   Years of education: 12   Highest education level: Not on file  Occupational History   Not on file  Tobacco Use   Smoking status: Former    Years: 5.00    Types: Cigarettes    Passive exposure: Never   Smokeless tobacco: Never  Vaping  Use   Vaping Use: Never used  Substance and Sexual Activity   Alcohol use: No   Drug use: No   Sexual activity: Not Currently  Other Topics Concern   Not on file  Social History Narrative   Marital Status:  Married Manufacturing systems engineer)    Children:  Daughter(1) Son (1)    Pets:  None    Living Situation: Lives with husband and daughter.     Occupation:  Retired Licensed conveyancer)    Education: 12th Grade    Tobacco Use/Exposure:  She used to smoke socially during the weekends but quit 40 years ago.    Alcohol Use:  Occasional   Drug Use:  None   Diet:  Regular   Exercise:  None   Hobbies:  Reading and playing volleyball             Social Determinants of Health   Financial Resource Strain: Low Risk    Difficulty of Paying Living Expenses: Not hard at all  Food Insecurity: No Food Insecurity   Worried About Charity fundraiser in the Last Year: Never true   Ran Out of Food in the Last Year: Never true  Transportation Needs: No Transportation Needs   Lack of Transportation (Medical): No   Lack of Transportation (  Non-Medical): No  Physical Activity: Inactive   Days of Exercise per Week: 0 days   Minutes of Exercise per Session: 0 min  Stress: No Stress Concern Present   Feeling of Stress : Not at all  Social Connections: Moderately Integrated   Frequency of Communication with Friends and Family: More than three times a week   Frequency of Social Gatherings with Friends and Family: More than three times a week   Attends Religious Services: More than 4 times per year   Active Member of [employer] or Organizations: No   Attends Archivist Meetings: Never   Marital Status: Married  Human resources officer Violence: Not At Risk   Fear of Current or Ex-Partner: No   Emotionally Abused: No   Physically Abused: No   Sexually Abused: No    Past Surgical History:  Procedure Laterality Date   ABDOMINAL HYSTERECTOMY     BREAST BIOPSY Left    2016 benign    LEFT HEART CATH AND CORONARY ANGIOGRAPHY N/A  11/21/2021   Procedure: LEFT HEART CATH AND CORONARY ANGIOGRAPHY;  Surgeon: Wellington Hampshire, MD;  Location: [facility];  Service: Cardiovascular;  Laterality: N/A;   MASTECTOMY     right side   REPLACEMENT TOTAL KNEE Bilateral    TUBAL LIGATION      Family History  Problem Relation Age of Onset   Diabetes Mother    Alzheimer's disease Mother    Hypertension Mother    Hypertension Father    Heart disease Father    Hyperlipidemia Father    Kidney disease Father    Diabetes Sister    Cancer Brother        lung   Alzheimer's disease Brother    Heart disease Brother    Hypertension Brother    Diabetes Brother    Hypertension Brother    Diabetes Brother    Hyperlipidemia Son    Hypertension Son    Diabetes Son    Hypertension Daughter    Hyperlipidemia Daughter     Allergies  Allergen Reactions   Penicillins Swelling   Sulfa Antibiotics Swelling   Amlodipine Swelling    LE Swelling   Lisinopril Cough    Current Outpatient Medications on File Prior to Visit  Medication Sig Dispense Refill   acetaminophen (TYLENOL) 650 MG CR tablet Take 650 mg by mouth 2 (two) times daily.     ascorbic acid (VITAMIN C) 500 MG tablet Take 500 mg by mouth daily.     aspirin 81 MG tablet Take 81 mg by mouth daily.     Biotin 1000 MCG CHEW Chew by mouth.     blood glucose meter kit and supplies KIT Dispense based on patient and insurance preference. Use up to four times daily as directed. (FOR ICD-9 250.00, 250.01). 1 each 11   brimonidine (ALPHAGAN) 0.2 % ophthalmic solution Place 1 drop into both eyes 2 (two) times a day.     Cholecalciferol (VITAMIN D-3) 5000 units TABS Take 1 tablet by mouth daily.     Dulaglutide (TRULICITY) 3 BM/8.4XL SOPN Inject 3 mg as directed once a week. 6 mL 3   DULoxetine (CYMBALTA) 30 MG capsule TAKE 1 CAPSULE BY MOUTH EVERY DAY 90 capsule 2   famotidine (PEPCID) 20 MG tablet TAKE 1 TABLET BY MOUTH EVERY DAY 90 tablet 1   fluticasone (FLONASE) 50  MCG/ACT nasal spray SPRAY 2 SPRAYS INTO EACH NOSTRIL EVERY DAY 16 mL 1   gabapentin (NEURONTIN) 300 MG  capsule Take 1 capsule (300 mg total) by mouth 3 (three) times daily. TAKE 1 CAPSULE BY MOUTH THREE TIMES DAILY 90 capsule 2   glipiZIDE (GLUCOTROL) 5 MG tablet Take 1 tablet (5 mg total) by mouth daily before breakfast AND 0.5 tablets (2.5 mg total) daily before supper. 135 tablet 3   ketorolac (ACULAR) 0.5 % ophthalmic solution INSTILL 1 DROP INTO RIGHT EYE 4 TIMES A DAY 5 mL 6   latanoprost (XALATAN) 0.005 % ophthalmic solution Place 1 drop into both eyes daily.     losartan (COZAAR) 50 MG tablet Take 50 mg by mouth daily.     Magnesium 250 MG TABS Take 1 tablet by mouth daily.     metoprolol succinate (TOPROL-XL) 25 MG 24 hr tablet Take 25 mg by mouth daily.     montelukast (SINGULAIR) 10 MG tablet Take 10 mg by mouth at bedtime.     omeprazole (PRILOSEC) 40 MG capsule Take 1 capsule (40 mg total) by mouth daily. Must keep appt on 12/06/16 with Dr. Caryl Bis 90 capsule 0   OneTouch Delica Lancets 79U MISC Use as instructed to check blood sugar 2-3 times a day.  DX E11.9 100 each 3   ONETOUCH ULTRA test strip CHECK BLOOD SUGAR 2 TO 3 TIMES DAILY AS DIRECTED 100 strip 5   pravastatin (PRAVACHOL) 20 MG tablet TAKE 1 TABLET BY MOUTH ONCE DAILY (Patient taking differently: Take 20 mg by mouth daily.) 90 tablet 0   timolol (TIMOPTIC) 0.5 % ophthalmic solution Place 1 drop into both eyes 2 (two) times a day.     zinc gluconate 50 MG tablet Take 50 mg by mouth daily.     No current facility-administered medications on file prior to visit.    BP 130/64   Pulse 76   Resp 18   Ht '5\' 1"'  (1.549 m)   Wt 191 lb (86.6 kg)   SpO2 100%   BMI 36.09 kg/m       Objective:   Physical Exam  General- No acute distress. Pleasant patient. Neck- Full range of motion, no jvd Lungs- Clear, even and unlabored. Heart- regular rate and rhythm. Neurologic- CNII- XII grossly intact.  Abdomen-soft, positive  bowel sounds, no rebound or guarding.  Faint minimal suprapubic tenderness to palpation.      Assessment & Plan:   Patient Instructions  Dysuria with recent blood clot.  Some recent suprapubic tenderness as well.  POCT urinalysis done today and will send urine for culture.  During the interim we will have you take Cipro 250 mg twice daily for 3 days.  After review of culture plan to place referral back to urologist.  With results of studies this might expedite your appointment.  Decreased GFR.  We will repeat metabolic panel follow potassium and kidney function.  Diabetes with recent A1c of 5.7.  Continue Trulicity and glipizide.  Recent fatigue-we will get CBC, B12, B1, TSH, T4 and iron level.  For recent dysphagia went ahead and placed referral to GI MD.  For hand pain right side with known arthritic changes and a left-sided sciatica, decided to give you a very low dose 3-day taper dose of Medrol.  With your low A1c average I think this is reasonable..Make sure you are eating low sugar diet while on Medrol.  For hair loss follow-up thyroid studies.  Thyroid studies negative can refer to dermatologist.  Follow-up in 3 weeks or sooner if needed.   Mackie Pai, PA-C

## 2022-05-06 LAB — CBC WITH DIFFERENTIAL/PLATELET
Basophils Absolute: 0.1 10*3/uL (ref 0.0–0.1)
Basophils Relative: 1.3 % (ref 0.0–3.0)
Eosinophils Absolute: 0.1 10*3/uL (ref 0.0–0.7)
Eosinophils Relative: 1.2 % (ref 0.0–5.0)
HCT: 36.5 % (ref 36.0–46.0)
Hemoglobin: 12 g/dL (ref 12.0–15.0)
Lymphocytes Relative: 26.6 % (ref 12.0–46.0)
Lymphs Abs: 1.9 10*3/uL (ref 0.7–4.0)
MCHC: 32.8 g/dL (ref 30.0–36.0)
MCV: 91.1 fl (ref 78.0–100.0)
Monocytes Absolute: 0.7 10*3/uL (ref 0.1–1.0)
Monocytes Relative: 9.9 % (ref 3.0–12.0)
Neutro Abs: 4.4 10*3/uL (ref 1.4–7.7)
Neutrophils Relative %: 61 % (ref 43.0–77.0)
Platelets: 204 10*3/uL (ref 150.0–400.0)
RBC: 4.01 Mil/uL (ref 3.87–5.11)
RDW: 16.1 % — ABNORMAL HIGH (ref 11.5–15.5)
WBC: 7.3 10*3/uL (ref 4.0–10.5)

## 2022-05-06 LAB — COMPREHENSIVE METABOLIC PANEL
ALT: 10 U/L (ref 0–35)
AST: 14 U/L (ref 0–37)
Albumin: 4.2 g/dL (ref 3.5–5.2)
Alkaline Phosphatase: 112 U/L (ref 39–117)
BUN: 21 mg/dL (ref 6–23)
CO2: 30 mEq/L (ref 19–32)
Calcium: 9.6 mg/dL (ref 8.4–10.5)
Chloride: 99 mEq/L (ref 96–112)
Creatinine, Ser: 1.22 mg/dL — ABNORMAL HIGH (ref 0.40–1.20)
GFR: 41.95 mL/min — ABNORMAL LOW (ref >60.00)
Glucose, Bld: 69 mg/dL — ABNORMAL LOW (ref 70–99)
Potassium: 4.4 mEq/L (ref 3.5–5.1)
Sodium: 138 mEq/L (ref 135–145)
Total Bilirubin: 0.5 mg/dL (ref 0.2–1.2)
Total Protein: 7.1 g/dL (ref 6.0–8.3)

## 2022-05-06 LAB — T4, FREE: Free T4: 0.9 ng/dL (ref 0.60–1.60)

## 2022-05-06 LAB — TSH: TSH: 0.83 u[IU]/mL (ref 0.35–5.50)

## 2022-05-06 LAB — IRON: Iron: 77 ug/dL (ref 42–145)

## 2022-05-06 LAB — HEMOGLOBIN A1C: Hgb A1c MFr Bld: 6.2 % (ref 4.6–6.5)

## 2022-05-06 LAB — VITAMIN B12: Vitamin B-12: 261 pg/mL (ref 211–911)

## 2022-05-07 ENCOUNTER — Encounter: Payer: Self-pay | Admitting: Physician Assistant

## 2022-05-07 MED ORDER — CIPROFLOXACIN HCL 250 MG PO TABS
250.0000 mg | ORAL_TABLET | Freq: Two times a day (BID) | ORAL | 0 refills | Status: AC
Start: 2022-05-07 — End: 2022-05-10

## 2022-05-07 NOTE — Addendum Note (Signed)
Addended by: Anabel Halon on: 05/07/2022 02:46 PM   Modules accepted: Orders

## 2022-05-08 LAB — URINE CULTURE
MICRO NUMBER:: 13489411
SPECIMEN QUALITY:: ADEQUATE

## 2022-05-10 LAB — VITAMIN B1: Vitamin B1 (Thiamine): <6 nmol/L — ABNORMAL LOW (ref 8–30)

## 2022-05-22 ENCOUNTER — Telehealth: Payer: Self-pay | Admitting: Medical

## 2022-05-22 ENCOUNTER — Other Ambulatory Visit: Payer: Self-pay | Admitting: Medical

## 2022-05-22 MED ORDER — GABAPENTIN 300 MG PO CAPS: ORAL_CAPSULE | ORAL | 2 refills | Status: DC

## 2022-05-22 NOTE — Telephone Encounter (Signed)
Rx sent 

## 2022-05-26 ENCOUNTER — Other Ambulatory Visit: Payer: Self-pay | Admitting: Medical

## 2022-05-29 ENCOUNTER — Ambulatory Visit (INDEPENDENT_AMBULATORY_CARE_PROVIDER_SITE_OTHER): Payer: HMO | Admitting: Physician Assistant

## 2022-05-29 ENCOUNTER — Encounter: Payer: Self-pay | Admitting: Physician Assistant

## 2022-05-29 VITALS — BP 102/60 | HR 75 | Ht 61.0 in | Wt 189.0 lb

## 2022-05-29 DIAGNOSIS — R1310 Dysphagia, unspecified: Secondary | ICD-10-CM

## 2022-05-29 DIAGNOSIS — K219 Gastro-esophageal reflux disease without esophagitis: Secondary | ICD-10-CM

## 2022-05-29 DIAGNOSIS — R1013 Epigastric pain: Secondary | ICD-10-CM | POA: Diagnosis not present

## 2022-05-29 MED ORDER — OMEPRAZOLE 40 MG PO CPDR
40.0000 mg | DELAYED_RELEASE_CAPSULE | Freq: Two times a day (BID) | ORAL | 3 refills | Status: DC
Start: 2022-05-29 — End: 2022-09-03

## 2022-05-29 NOTE — Patient Instructions (Signed)
You have been scheduled for a Barium Esophogram at [facility] Radiology (1st floor of the hospital) on 06/15/22 at 9:30am. Please arrive 15 minutes prior to your appointment for registration. Make certain not to have anything to eat or drink 3 hours prior to your test. If you need to reschedule for any reason, please contact radiology at [phone] to do so. __________________________________________________________________ A barium swallow is an examination that concentrates on views of the esophagus. This tends to be a double contrast exam (barium and two liquids which, when combined, create a gas to distend the wall of the oesophagus) or single contrast (non-ionic iodine based). The study is usually tailored to your symptoms so a good history is essential. Attention is paid during the study to the form, structure and configuration of the esophagus, looking for functional disorders (such as aspiration, dysphagia, achalasia, motility and reflux) EXAMINATION You may be asked to change into a gown, depending on the type of swallow being performed. A radiologist and radiographer will perform the procedure. The radiologist will advise you of the type of contrast selected for your procedure and direct you during the exam. You will be asked to stand, sit or lie in several different positions and to hold a small amount of fluid in your mouth before being asked to swallow while the imaging is performed .In some instances you may be asked to swallow barium coated marshmallows to assess the motility of a solid food bolus. The exam can be recorded as a digital or video fluoroscopy procedure. POST PROCEDURE It will take 1-2 days for the barium to pass through your system. To facilitate this, it is important, unless otherwise directed, to increase your fluids for the next 24-48hrs and to resume your normal diet.  This test typically takes about 30 minutes to  perform. __________________________________________________________________________________  Increase your Omeprazole to '40mg'$  - Take 1 capsule by mouth twice daily.   Continue Pepcid '20mg'$  at bedtime.   Thank you for choosing me and [facility].  Ellouise Newer PA

## 2022-05-29 NOTE — Progress Notes (Signed)
Chief Complaint: Dysphagia  HPI:    Donna Fuller is an 81 year old female, who previously followed with [facility], with a past medical history as listed below including breast cancer, diabetes and reflux, 11/21/2021 echo with LVEF 60-65%, who was referred to me by Mackie Pai, PA-C for a complaint of dysphagia.      11/20/2014 colonoscopy with moderate diverticulosis in the left colon and small internal hemorrhoids.    05/05/2022 patient discussed some dysphagia among other things with her PCP.  She was referred to our clinic.    05/05/2022 CBC, CMP, TSH, B12 and iron normal.  Patient had urine culture at that time which showed E. coli and she was treated with antibiotics.    Today, patient presents to clinic and tells me that for the past 3 to 4 years she has been having some trouble swallowing which seems to be getting more frequent.  Tells me that when she eats certain foods they get stuck on the way down especially cornbread which she loves as well as when she drinks she feels like she has to cough and cold things seem to be worse for this.  She constantly feels like there is something in her throat and tells me that her voice and hoarseness seem to "come and go".  Also describes an epigastric discomfort and heartburn regardless of Omeprazole 40 mg every morning and Pepcid 20 mg nightly.    Denies fever, chills, weight loss, blood in her stool or symptoms that awaken her from sleep.  Past Medical History:  Diagnosis Date   Arthritis    Breast cancer ([place]) 2007   Right breast, s/p mastectomy   Diabetes mellitus without complication ([place])    GERD (gastroesophageal reflux disease)    Gout    Hyperlipidemia    Hypertension    Spinal stenosis     Past Surgical History:  Procedure Laterality Date   ABDOMINAL HYSTERECTOMY     BREAST BIOPSY Left    2016 benign    LEFT HEART CATH AND CORONARY ANGIOGRAPHY N/A 11/21/2021   Procedure: LEFT HEART CATH AND CORONARY ANGIOGRAPHY;  Surgeon:  Wellington Hampshire, MD;  Location: [facility];  Service: Cardiovascular;  Laterality: N/A;   MASTECTOMY     right side   REPLACEMENT TOTAL KNEE Bilateral    TUBAL LIGATION      Current Outpatient Medications  Medication Sig Dispense Refill   acetaminophen (TYLENOL) 650 MG CR tablet Take 650 mg by mouth 2 (two) times daily.     ascorbic acid (VITAMIN C) 500 MG tablet Take 500 mg by mouth daily.     aspirin 81 MG tablet Take 81 mg by mouth daily.     Biotin 1000 MCG CHEW Chew by mouth.     blood glucose meter kit and supplies KIT Dispense based on patient and insurance preference. Use up to four times daily as directed. (FOR ICD-9 250.00, 250.01). 1 each 11   brimonidine (ALPHAGAN) 0.2 % ophthalmic solution Place 1 drop into both eyes 2 (two) times a day.     Cholecalciferol (VITAMIN D-3) 5000 units TABS Take 1 tablet by mouth daily.     Dulaglutide (TRULICITY) 3 VO/3.5KK SOPN Inject 3 mg as directed once a week. 6 mL 3   DULoxetine (CYMBALTA) 30 MG capsule TAKE 1 CAPSULE BY MOUTH EVERY DAY 90 capsule 2   famotidine (PEPCID) 20 MG tablet TAKE 1 TABLET BY MOUTH EVERY DAY 90 tablet 1   fluticasone (FLONASE)  50 MCG/ACT nasal spray SPRAY 2 SPRAYS INTO EACH NOSTRIL EVERY DAY 16 mL 1   gabapentin (NEURONTIN) 300 MG capsule TAKE 1 CAPSULE BY MOUTH THREE TIMES A DAY 90 capsule 2   glipiZIDE (GLUCOTROL) 5 MG tablet Take 1 tablet (5 mg total) by mouth daily before breakfast AND 0.5 tablets (2.5 mg total) daily before supper. 135 tablet 3   ketorolac (ACULAR) 0.5 % ophthalmic solution INSTILL 1 DROP INTO RIGHT EYE 4 TIMES A DAY 5 mL 6   latanoprost (XALATAN) 0.005 % ophthalmic solution Place 1 drop into both eyes daily.     losartan (COZAAR) 50 MG tablet Take 50 mg by mouth daily.     Magnesium 250 MG TABS Take 1 tablet by mouth daily.     methylPREDNISolone (MEDROL) 4 MG tablet 4 tab po day 1, 3 tab po day 2 and 1 tab po day 3 8 tablet 0   metoprolol succinate (TOPROL-XL) 25 MG 24 hr tablet  Take 25 mg by mouth daily.     montelukast (SINGULAIR) 10 MG tablet Take 10 mg by mouth at bedtime.     omeprazole (PRILOSEC) 40 MG capsule Take 1 capsule (40 mg total) by mouth daily. Must keep appt on 12/06/16 with Dr. Caryl Bis 90 capsule 0   OneTouch Delica Lancets 54O MISC Use as instructed to check blood sugar 2-3 times a day.  DX E11.9 100 each 3   ONETOUCH ULTRA test strip CHECK BLOOD SUGAR 2 TO 3 TIMES DAILY AS DIRECTED 100 strip 5   pravastatin (PRAVACHOL) 20 MG tablet TAKE 1 TABLET BY MOUTH ONCE DAILY (Patient taking differently: Take 20 mg by mouth daily.) 90 tablet 0   timolol (TIMOPTIC) 0.5 % ophthalmic solution Place 1 drop into both eyes 2 (two) times a day.     zinc gluconate 50 MG tablet Take 50 mg by mouth daily.     No current facility-administered medications for this visit.    Allergies as of 05/29/2022 - Review Complete 05/05/2022  Allergen Reaction Noted   Penicillins Swelling 05/31/2019   Sulfa antibiotics Swelling 12/13/2013   Amlodipine Swelling 05/20/2017   Lisinopril Cough 05/20/2017    Family History  Problem Relation Age of Onset   Diabetes Mother    Alzheimer's disease Mother    Hypertension Mother    Hypertension Father    Heart disease Father    Hyperlipidemia Father    Kidney disease Father    Diabetes Sister    Cancer Brother        lung   Alzheimer's disease Brother    Heart disease Brother    Hypertension Brother    Diabetes Brother    Hypertension Brother    Diabetes Brother    Hyperlipidemia Son    Hypertension Son    Diabetes Son    Hypertension Daughter    Hyperlipidemia Daughter     Social History   Socioeconomic History   Marital status: Married    Spouse name: Jimmy   Number of children: 3   Years of education: 12   Highest education level: Not on file  Occupational History   Not on file  Tobacco Use   Smoking status: Former    Years: 5.00    Types: Cigarettes    Passive exposure: Never   Smokeless tobacco: Never   Vaping Use   Vaping Use: Never used  Substance and Sexual Activity   Alcohol use: No   Drug use: No   Sexual activity: Not Currently  Other Topics Concern   Not on file  Social History Narrative   Marital Status:  Married Manufacturing systems engineer)    Children:  Daughter(1) Son (1)    Pets:  None    Living Situation: Lives with husband and daughter.     Occupation:  Retired Licensed conveyancer)    Education: 12th Grade    Tobacco Use/Exposure:  She used to smoke socially during the weekends but quit 40 years ago.    Alcohol Use:  Occasional   Drug Use:  None   Diet:  Regular   Exercise:  None   Hobbies:  Reading and playing volleyball             Social Determinants of Health   Financial Resource Strain: Low Risk  (10/01/2021)   Overall Financial Resource Strain (CARDIA)    Difficulty of Paying Living Expenses: Not hard at all  Food Insecurity: No Food Insecurity (10/01/2021)   Hunger Vital Sign    Worried About Running Out of Food in the Last Year: Never true    Ran Out of Food in the Last Year: Never true  Transportation Needs: No Transportation Needs (10/01/2021)   PRAPARE - Hydrologist (Medical): No    Lack of Transportation (Non-Medical): No  Physical Activity: Inactive (10/01/2021)   Exercise Vital Sign    Days of Exercise per Week: 0 days    Minutes of Exercise per Session: 0 min  Stress: No Stress Concern Present (10/01/2021)   [place]    Feeling of Stress : Not at all  Social Connections: Moderately Integrated (10/01/2021)   Social Connection and Isolation Panel [NHANES]    Frequency of Communication with Friends and Family: More than three times a week    Frequency of Social Gatherings with Friends and Family: More than three times a week    Attends Religious Services: More than 4 times per year    Active Member of [employer] or Organizations: No    Attends Archivist Meetings: Never     Marital Status: Married  Human resources officer Violence: Not At Risk (10/01/2021)   Humiliation, Afraid, Rape, and Kick questionnaire    Fear of Current or Ex-Partner: No    Emotionally Abused: No    Physically Abused: No    Sexually Abused: No    Review of Systems:    Constitutional: No weight loss, fever or chills Skin: No rash Cardiovascular: No chest pain  Respiratory: No SOB  Gastrointestinal: See HPI and otherwise negative Genitourinary: No dysuria  Neurological: No headache, dizziness or syncope Musculoskeletal: No new muscle or joint pain Hematologic: No bleeding Psychiatric: No history of depression or anxiety   Physical Exam:  Vital signs: BP 102/60   Pulse 75   Ht '5\' 1"'  (1.549 m)   Wt 189 lb (85.7 kg)   SpO2 98%   BMI 35.71 kg/m    Constitutional:   Pleasant overweight AA female appears to be in NAD, Well developed, Well nourished, alert and cooperative Head:  Normocephalic and atraumatic. Eyes:   PEERL, EOMI. No icterus. Conjunctiva pink. Ears:  Normal auditory acuity. Neck:  Supple Throat: Oral cavity and pharynx without inflammation, swelling or lesion.  Respiratory: Respirations even and unlabored. Lungs clear to auscultation bilaterally.   No wheezes, crackles, or rhonchi.  Cardiovascular: Normal S1, S2. No MRG. Regular rate and rhythm. No peripheral edema, cyanosis or pallor.  Gastrointestinal:  Soft, nondistended, mild epigastric TTP, no  rebound or guarding. Normal bowel sounds. No appreciable masses or hepatomegaly. Rectal:  Not performed.  Msk:  Symmetrical without gross deformities. Without edema, no deformity or joint abnormality.  Neurologic:  Alert and  oriented x4;  grossly normal neurologically.  Skin:   Dry and intact without significant lesions or rashes. Psychiatric:  Demonstrates good judgement and reason without abnormal affect or behaviors.  RELEVANT LABS AND IMAGING: CBC    Component Value Date/Time   WBC 7.3 05/05/2022 1558   RBC 4.01  05/05/2022 1558   HGB 12.0 05/05/2022 1558   HCT 36.5 05/05/2022 1558   PLT 204.0 05/05/2022 1558   MCV 91.1 05/05/2022 1558   MCH 29.6 11/21/2021 0055   MCHC 32.8 05/05/2022 1558   RDW 16.1 (H) 05/05/2022 1558   LYMPHSABS 1.9 05/05/2022 1558   MONOABS 0.7 05/05/2022 1558   EOSABS 0.1 05/05/2022 1558   BASOSABS 0.1 05/05/2022 1558    CMP     Component Value Date/Time   NA 138 05/05/2022 1558   K 4.4 05/05/2022 1558   CL 99 05/05/2022 1558   CO2 30 05/05/2022 1558   GLUCOSE 69 (L) 05/05/2022 1558   BUN 21 05/05/2022 1558   CREATININE 1.22 (H) 05/05/2022 1558   CREATININE 1.40 (H) 06/27/2021 1513   CALCIUM 9.6 05/05/2022 1558   PROT 7.1 05/05/2022 1558   PROT 7.1 12/12/2014 1022   ALBUMIN 4.2 05/05/2022 1558   AST 14 05/05/2022 1558   ALT 10 05/05/2022 1558   ALKPHOS 112 05/05/2022 1558   BILITOT 0.5 05/05/2022 1558   GFRNONAA 45 (L) 11/21/2021 0055   GFRAA 51 (L) 06/03/2019 0549    Assessment: 1.  Dysphagia: Towards liquids and solids worsening over the past 3 to 4 years, chronic reflux symptoms uncontrolled on Omeprazole 40 mg every morning and Pepcid 20 mg nightly, also some hoarseness, reflux and epigastric pain; likely esophageal stricture versus dysmotility versus other 2.  GERD and epigastric pain: With above  Plan: 1.  Scheduled patient for barium esophagram with tablet. 2.  Pending above we will consider EGD versus referral to speech pathology. 3.  Increased Omeprazole to 40 mg twice daily, 30-60 minutes before breakfast and dinner.  #60 with 5 refills. 4.  Continue Pepcid 20 mg nightly 5.  Reviewed antidysphagia measures including taking small bites, drinking sips of water in between bites, avoiding distraction while eating and the chin tuck technique. 6.  Patient to follow in clinic per recommendations after imaging above.  She was assigned to Dr. Lyndel Safe today.  Ellouise Newer, PA-C [facility] 05/29/2022, 11:27 AM  Cc: Mackie Pai, PA-C

## 2022-05-29 NOTE — Progress Notes (Signed)
Agree with assessment/plan RG 

## 2022-06-11 DIAGNOSIS — R072 Precordial pain: Secondary | ICD-10-CM | POA: Diagnosis not present

## 2022-06-11 DIAGNOSIS — I519 Heart disease, unspecified: Secondary | ICD-10-CM | POA: Diagnosis not present

## 2022-06-11 DIAGNOSIS — R0609 Other forms of dyspnea: Secondary | ICD-10-CM | POA: Diagnosis not present

## 2022-06-11 DIAGNOSIS — I517 Cardiomegaly: Secondary | ICD-10-CM | POA: Diagnosis not present

## 2022-06-11 DIAGNOSIS — E7800 Pure hypercholesterolemia, unspecified: Secondary | ICD-10-CM | POA: Diagnosis not present

## 2022-06-11 DIAGNOSIS — N183 Chronic kidney disease, stage 3 unspecified: Secondary | ICD-10-CM | POA: Diagnosis not present

## 2022-06-11 DIAGNOSIS — R600 Localized edema: Secondary | ICD-10-CM | POA: Diagnosis not present

## 2022-06-11 DIAGNOSIS — I10 Essential (primary) hypertension: Secondary | ICD-10-CM | POA: Diagnosis not present

## 2022-06-15 ENCOUNTER — Ambulatory Visit (HOSPITAL_COMMUNITY)
Admission: RE | Admit: 2022-06-15 | Discharge: 2022-06-15 | Disposition: A | Discharge status: Home / Self Care | Payer: HMO | Source: Ambulatory Visit | Attending: Physician Assistant | Admitting: Physician Assistant

## 2022-06-15 DIAGNOSIS — K219 Gastro-esophageal reflux disease without esophagitis: Secondary | ICD-10-CM | POA: Insufficient documentation

## 2022-06-15 DIAGNOSIS — K224 Dyskinesia of esophagus: Secondary | ICD-10-CM | POA: Diagnosis not present

## 2022-06-15 DIAGNOSIS — R1013 Epigastric pain: Secondary | ICD-10-CM | POA: Diagnosis not present

## 2022-06-15 DIAGNOSIS — R1310 Dysphagia, unspecified: Secondary | ICD-10-CM | POA: Insufficient documentation

## 2022-06-18 ENCOUNTER — Other Ambulatory Visit: Payer: Self-pay | Admitting: Medical

## 2022-06-22 DIAGNOSIS — I517 Cardiomegaly: Secondary | ICD-10-CM | POA: Diagnosis not present

## 2022-07-13 ENCOUNTER — Other Ambulatory Visit: Payer: Self-pay | Admitting: Medical

## 2022-07-14 ENCOUNTER — Ambulatory Visit (INDEPENDENT_AMBULATORY_CARE_PROVIDER_SITE_OTHER): Payer: HMO | Admitting: Medical

## 2022-07-14 VITALS — BP 130/64 | HR 87 | Temp 97.9°F | Resp 18 | Ht 61.0 in | Wt 185.0 lb

## 2022-07-14 DIAGNOSIS — J309 Allergic rhinitis, unspecified: Secondary | ICD-10-CM

## 2022-07-14 DIAGNOSIS — R059 Cough, unspecified: Secondary | ICD-10-CM

## 2022-07-14 DIAGNOSIS — R062 Wheezing: Secondary | ICD-10-CM

## 2022-07-14 MED ORDER — LEVOCETIRIZINE DIHYDROCHLORIDE 5 MG PO TABS: 5.0000 mg | ORAL_TABLET | Freq: Every evening | ORAL | 3 refills | Status: DC

## 2022-07-14 MED ORDER — BENZONATATE 100 MG PO CAPS: 100.0000 mg | ORAL_CAPSULE | Freq: Three times a day (TID) | ORAL | 0 refills | Status: DC | PRN

## 2022-07-14 MED ORDER — AZITHROMYCIN 250 MG PO TABS: ORAL_TABLET | ORAL | 0 refills | Status: AC

## 2022-07-14 MED ORDER — ALBUTEROL SULFATE HFA 108 (90 BASE) MCG/ACT IN AERS: 2.0000 | INHALATION_SPRAY | Freq: Four times a day (QID) | RESPIRATORY_TRACT | 0 refills | Status: DC | PRN

## 2022-07-14 NOTE — Patient Instructions (Addendum)
2 weeks of pnd and cough. Also wheezing at night for 2 weeks.  Suspect allergic rhinitis with asthma flare.   Rx xyzal antihistamine, flonase nasal spray and benzonatate for cough.  Albuterol inhaler for wheezing  If cough not resolving by Friday get chest xray and add azithromycin antibiotic. Order of cxr and antibiotic placed.  Follow up 2 weeks or sooner if needed.

## 2022-07-14 NOTE — Progress Notes (Signed)
Subjective:    Patient ID: Donna Fuller, female    DOB: Aug 28, 1941, 81 y.o.   MRN: [id number]  HPI  Pt in for cough for about 2 weeks.   Some white phlem when cough.   No fever, no chills or sweats.  Allergies- on and off in the past seasonally. Just got refill of flonase.  Late summer/Fall can get flares per pt.  In past was on singulair. [facility]  Also some intermittent wheezing in past 2 weeks.      Review of Systems  Constitutional:  Negative for chills, fatigue and fever.  HENT:  Positive for congestion and postnasal drip. Negative for sinus pressure and sinus pain.   Respiratory:  Positive for wheezing. Negative for chest tightness.   Cardiovascular:  Negative for chest pain and palpitations.  Gastrointestinal:  Negative for diarrhea.  Musculoskeletal:  Negative for back pain and gait problem.  Neurological:  Negative for dizziness, seizures and headaches.  Hematological:  Negative for adenopathy. Does not bruise/bleed easily.   Past Medical History:  Diagnosis Date   Arthritis    Breast cancer ([place]) 2007   Right breast, s/p mastectomy   Diabetes mellitus without complication ([facility])    GERD (gastroesophageal reflux disease)    Gout    Hyperlipidemia    Hypertension    Spinal stenosis      Social History   Socioeconomic History   Marital status: Married    Spouse name: Jimmy   Number of children: 3   Years of education: 12   Highest education level: Not on file  Occupational History   Not on file  Tobacco Use   Smoking status: Former    Years: 5.00    Types: Cigarettes    Passive exposure: Never   Smokeless tobacco: Never  Vaping Use   Vaping Use: Never used  Substance and Sexual Activity   Alcohol use: No   Drug use: No   Sexual activity: Not Currently  Other Topics Concern   Not on file  Social History Narrative   Marital Status:  Married Manufacturing systems engineer)    Children:  Daughter(1) Son (1)    Pets:  None    Living Situation:  Lives with husband and daughter.     Occupation:  Retired Licensed conveyancer)    Education: 12th Grade    Tobacco Use/Exposure:  She used to smoke socially during the weekends but quit 40 years ago.    Alcohol Use:  Occasional   Drug Use:  None   Diet:  Regular   Exercise:  None   Hobbies:  Reading and playing volleyball             Social Determinants of Health   Financial Resource Strain: Low Risk  (10/01/2021)   Overall Financial Resource Strain (CARDIA)    Difficulty of Paying Living Expenses: Not hard at all  Food Insecurity: No Food Insecurity (10/01/2021)   Hunger Vital Sign    Worried About Running Out of Food in the Last Year: Never true    Ran Out of Food in the Last Year: Never true  Transportation Needs: No Transportation Needs (10/01/2021)   PRAPARE - Hydrologist (Medical): No    Lack of Transportation (Non-Medical): No  Physical Activity: Inactive (10/01/2021)   Exercise Vital Sign    Days of Exercise per Week: 0 days    Minutes of Exercise per Session: 0 min  Stress: No Stress Concern Present (10/01/2021)  [employer] - Occupational Stress Questionnaire    Feeling of Stress : Not at all  Social Connections: Moderately Integrated (10/01/2021)   Social Connection and Isolation Panel [NHANES]    Frequency of Communication with Friends and Family: More than three times a week    Frequency of Social Gatherings with Friends and Family: More than three times a week    Attends Religious Services: More than 4 times per year    Active Member of [employer] or Organizations: No    Attends Archivist Meetings: Never    Marital Status: Married  Human resources officer Violence: Not At Risk (10/01/2021)   Humiliation, Afraid, Rape, and Kick questionnaire    Fear of Current or Ex-Partner: No    Emotionally Abused: No    Physically Abused: No    Sexually Abused: No    Past Surgical History:  Procedure Laterality Date   ABDOMINAL  HYSTERECTOMY     BREAST BIOPSY Left    2016 benign    LEFT HEART CATH AND CORONARY ANGIOGRAPHY N/A 11/21/2021   Procedure: LEFT HEART CATH AND CORONARY ANGIOGRAPHY;  Surgeon: Wellington Hampshire, MD;  Location: [facility];  Service: Cardiovascular;  Laterality: N/A;   MASTECTOMY     right side   REPLACEMENT TOTAL KNEE Bilateral    TUBAL LIGATION      Family History  Problem Relation Age of Onset   Diabetes Mother    Alzheimer's disease Mother    Hypertension Mother    Hypertension Father    Heart disease Father    Hyperlipidemia Father    Kidney disease Father    Diabetes Sister    Cancer Brother        lung   Alzheimer's disease Brother    Heart disease Brother    Hypertension Brother    Diabetes Brother    Hypertension Brother    Diabetes Brother    Hyperlipidemia Son    Hypertension Son    Diabetes Son    Hypertension Daughter    Hyperlipidemia Daughter     Allergies  Allergen Reactions   Penicillins Swelling   Sulfa Antibiotics Swelling   Amlodipine Swelling    LE Swelling   Lisinopril Cough    Current Outpatient Medications on File Prior to Visit  Medication Sig Dispense Refill   acetaminophen (TYLENOL) 650 MG CR tablet Take 650 mg by mouth 2 (two) times daily.     ascorbic acid (VITAMIN C) 500 MG tablet Take 500 mg by mouth daily.     aspirin 81 MG tablet Take 81 mg by mouth daily.     Biotin 1000 MCG CHEW Chew by mouth.     blood glucose meter kit and supplies KIT Dispense based on patient and insurance preference. Use up to four times daily as directed. (FOR ICD-9 250.00, 250.01). 1 each 11   brimonidine (ALPHAGAN) 0.2 % ophthalmic solution Place 1 drop into both eyes 2 (two) times a day.     Cholecalciferol (VITAMIN D-3) 5000 units TABS Take 1 tablet by mouth daily.     Dulaglutide (TRULICITY) 3 OH/6.0VP SOPN Inject 3 mg as directed once a week. 6 mL 3   DULoxetine (CYMBALTA) 30 MG capsule TAKE 1 CAPSULE BY MOUTH EVERY DAY 90 capsule 2    famotidine (PEPCID) 20 MG tablet TAKE 1 TABLET BY MOUTH EVERY DAY 90 tablet 1   fluticasone (FLONASE) 50 MCG/ACT nasal spray SPRAY 2 SPRAYS INTO EACH NOSTRIL EVERY DAY 16  mL 1   gabapentin (NEURONTIN) 300 MG capsule TAKE 1 CAPSULE BY MOUTH THREE TIMES A DAY 90 capsule 2   glipiZIDE (GLUCOTROL) 5 MG tablet Take 1 tablet (5 mg total) by mouth daily before breakfast AND 0.5 tablets (2.5 mg total) daily before supper. 135 tablet 3   ketorolac (ACULAR) 0.5 % ophthalmic solution INSTILL 1 DROP INTO RIGHT EYE 4 TIMES A DAY 5 mL 6   latanoprost (XALATAN) 0.005 % ophthalmic solution Place 1 drop into both eyes daily.     losartan (COZAAR) 50 MG tablet Take 50 mg by mouth daily.     methylPREDNISolone (MEDROL) 4 MG tablet 4 tab po day 1, 3 tab po day 2 and 1 tab po day 3 8 tablet 0   metoprolol succinate (TOPROL-XL) 25 MG 24 hr tablet Take 25 mg by mouth daily.     montelukast (SINGULAIR) 10 MG tablet Take 10 mg by mouth at bedtime.     omeprazole (PRILOSEC) 40 MG capsule Take 1 capsule (40 mg total) by mouth 2 (two) times daily. Must keep appt on 12/06/16 with Dr. Caryl Bis 60 capsule 3   OneTouch Delica Lancets 63J MISC Use as instructed to check blood sugar 2-3 times a day.  DX E11.9 100 each 3   ONETOUCH ULTRA test strip CHECK BLOOD SUGAR 2 TO 3 TIMES DAILY AS DIRECTED 100 strip 5   pravastatin (PRAVACHOL) 20 MG tablet TAKE 1 TABLET BY MOUTH ONCE DAILY (Patient taking differently: Take 20 mg by mouth daily.) 90 tablet 0   timolol (TIMOPTIC) 0.5 % ophthalmic solution Place 1 drop into both eyes 2 (two) times a day.     No current facility-administered medications on file prior to visit.    BP 130/64   Pulse 87   Temp 97.9 F (36.6 C)   Resp 18   Ht '5\' 1"'  (1.549 m)   Wt 185 lb (83.9 kg)   SpO2 96%   BMI 34.96 kg/m        Objective:   Physical Exam  General Mental Status- Alert. General Appearance- Not in acute distress.   Skin General: Color- Normal Color. Moisture- Normal  Moisture.  Neck Carotid Arteries- Normal color. Moisture- Normal Moisture. No carotid bruits. No JVD.  Chest and Lung Exam Auscultation: Breath Sounds:-Normal.  Cardiovascular Auscultation:Rythm- Regular. Murmurs & Other Heart Sounds:Auscultation of the heart reveals- No Murmurs.  Abdomen Inspection:-Inspeection Normal. Palpation/Percussion:Note:No mass. Palpation and Percussion of the abdomen reveal- Non Tender, Non Distended + BS, no rebound or guarding.    Neurologic Cranial Nerve exam:- CN III-XII intact(No nystagmus), symmetric smile. Strength:- 5/5 equal and symmetric strength both upper and lower extremities.       Assessment & Plan:   Patient Instructions  2 weeks of pnd and cough. Also wheezing at night for 2 weeks.  Suspect allergic rhinitis with asthma flare.   Rx xyzal antihistamine, flonase nasal spray and benzonatate for cough.  Albuterol inhaler for wheezing  If cough not resolving by Friday get chest xray and add azithromycin antibiotic. Order of cxr and antibiotic placed.  Follow up 2 weeks or sooner if needed.    Mackie Pai, PA-C

## 2022-07-21 ENCOUNTER — Ambulatory Visit (HOSPITAL_BASED_OUTPATIENT_CLINIC_OR_DEPARTMENT_OTHER)
Admission: RE | Admit: 2022-07-21 | Discharge: 2022-07-21 | Disposition: A | Discharge status: Home / Self Care | Payer: HMO | Source: Ambulatory Visit | Attending: Medical | Admitting: Medical

## 2022-07-21 DIAGNOSIS — R059 Cough, unspecified: Secondary | ICD-10-CM | POA: Insufficient documentation

## 2022-07-21 DIAGNOSIS — R062 Wheezing: Secondary | ICD-10-CM | POA: Diagnosis not present

## 2022-07-23 DIAGNOSIS — I517 Cardiomegaly: Secondary | ICD-10-CM | POA: Diagnosis not present

## 2022-07-23 DIAGNOSIS — I519 Heart disease, unspecified: Secondary | ICD-10-CM | POA: Diagnosis not present

## 2022-07-23 DIAGNOSIS — I340 Nonrheumatic mitral (valve) insufficiency: Secondary | ICD-10-CM | POA: Diagnosis not present

## 2022-07-23 DIAGNOSIS — R0609 Other forms of dyspnea: Secondary | ICD-10-CM | POA: Diagnosis not present

## 2022-07-23 DIAGNOSIS — N183 Chronic kidney disease, stage 3 unspecified: Secondary | ICD-10-CM | POA: Diagnosis not present

## 2022-07-23 DIAGNOSIS — I10 Essential (primary) hypertension: Secondary | ICD-10-CM | POA: Diagnosis not present

## 2022-07-23 DIAGNOSIS — E7800 Pure hypercholesterolemia, unspecified: Secondary | ICD-10-CM | POA: Diagnosis not present

## 2022-07-23 DIAGNOSIS — M79604 Pain in right leg: Secondary | ICD-10-CM | POA: Diagnosis not present

## 2022-07-23 DIAGNOSIS — M79605 Pain in left leg: Secondary | ICD-10-CM | POA: Diagnosis not present

## 2022-07-31 NOTE — Progress Notes (Signed)
Office Visit Note  Patient: Donna Fuller             Date of Birth: 1941/08/04           MRN: [id number]             PCP: Elise Benne Referring: Elise Benne Visit Date: 08/14/2022 Occupation: '@GUAROCC'$ @  Subjective:  Pain in multiple joints   History of Present Illness: Donna Fuller is a 81 y.o. female with history of seropositive rheumatoid arthritis, osteoarthritis and degenerative disc disease.  She has been off immunosuppressive agents for a long time.  She states recently she has been experiencing discomfort and tingling in her right first second and third fingers.  She was diagnosed with carpal tunnel syndrome 2 years ago.  She has not noticed any joint swelling.  Her bilateral knee joints are replaced which are causing discomfort.  She is planning to see an orthopedic surgeon.  She continues to have some discomfort in the left hip.  She also has chronic lower back pain which causes discomfort.  She states the lower back pain radiates down into her left lower extremity.  she has an appointment coming up with the surgeon.  Activities of Daily Living:  Patient reports morning stiffness for 5 minutes.   Patient Reports nocturnal pain.  Difficulty dressing/grooming: Denies Difficulty climbing stairs: Reports Difficulty getting out of chair: Reports Difficulty using hands for taps, buttons, cutlery, and/or writing: Reports  Review of Systems  Constitutional:  Positive for fatigue.  HENT:  Positive for mouth dryness. Negative for mouth sores.   Eyes:  Negative for dryness.  Respiratory:  Positive for shortness of breath.   Cardiovascular:  Negative for chest pain and palpitations.  Gastrointestinal:  Negative for blood in stool, constipation and diarrhea.  Endocrine: Negative for increased urination.  Genitourinary:  Negative for involuntary urination.  Musculoskeletal:  Positive for joint pain, gait problem, joint pain, joint swelling, myalgias, muscle  weakness, morning stiffness, muscle tenderness and myalgias.  Skin:  Positive for hair loss. Negative for color change, rash and sensitivity to sunlight.  Allergic/Immunologic: Positive for susceptible to infections.  Neurological:  Positive for dizziness. Negative for headaches.  Hematological:  Negative for swollen glands.  Psychiatric/Behavioral:  Positive for sleep disturbance. Negative for depressed mood. The patient is nervous/anxious.     PMFS History:  Patient Active Problem List   Diagnosis Date Noted   Type 2 diabetes mellitus with stage 3b chronic kidney disease, without long-term current use of insulin ([place]) 04/17/2022   Angina pectoris ([place]) 11/21/2021   Diabetes mellitus type 2 with complications ([place]) 25/85/2778   CAD (coronary artery disease) 11/21/2021   LV hypertrophy, hypertensive 11/21/2021   Elevated troponin 11/21/2021   Atypical chest pain 11/20/2021   DDD (degenerative disc disease), lumbar 11/03/2021   Rheumatoid factor positive 07/29/2021   History of total bilateral knee replacement 07/29/2021   Other idiopathic scoliosis, thoracic region 07/08/2021   Estrogen deficiency 07/08/2021   Arthralgia of both hands 07/08/2021   Moderate nonproliferative diabetic retinopathy of left eye ([place]) 02/11/2021   Pseudophakia 02/11/2021   Primary open angle glaucoma of both eyes, moderate stage 02/11/2021   Cystoid macular edema, right eye 02/11/2021   Type 2 diabetes mellitus with hyperglycemia, without long-term current use of insulin ([place]) 12/14/2019   Type 2 diabetes mellitus with diabetic polyneuropathy, without long-term current use of insulin ([place]) 12/14/2019   Pleuritic chest pain 05/30/2019   Fever 05/30/2019   Yeast vaginitis  07/19/2017   Urinary frequency 07/19/2017   CKD (chronic kidney disease) stage 3, GFR 30-59 ml/min (HCC) 05/20/2017   Gout 05/20/2017   Hyperlipidemia 09/26/2016   Spinal stenosis 09/26/2016   History of breast cancer 09/26/2016    Diabetes mellitus type 2, controlled ([place]) 09/26/2016   GERD (gastroesophageal reflux disease) 09/26/2016   Facet arthropathy, lumbosacral 03/13/2015   Neuropathy 03/13/2015   Hypertension    Lumbar radiculopathy 01/08/2014    Past Medical History:  Diagnosis Date   Arthritis    Breast cancer ([place]) 2007   Right breast, s/p mastectomy   Diabetes mellitus without complication (HCC)    GERD (gastroesophageal reflux disease)    Gout    Hyperlipidemia    Hypertension    Spinal stenosis     Family History  Problem Relation Age of Onset   Diabetes Mother    Alzheimer's disease Mother    Hypertension Mother    Hypertension Father    Heart disease Father    Hyperlipidemia Father    Kidney disease Father    Diabetes Sister    Cancer Brother        lung   Alzheimer's disease Brother    Heart disease Brother    Hypertension Brother    Diabetes Brother    Hypertension Brother    Diabetes Brother    Hyperlipidemia Son    Hypertension Son    Diabetes Son    Hypertension Daughter    Hyperlipidemia Daughter    Past Surgical History:  Procedure Laterality Date   ABDOMINAL HYSTERECTOMY     BREAST BIOPSY Left    2016 benign    LEFT HEART CATH AND CORONARY ANGIOGRAPHY N/A 11/21/2021   Procedure: LEFT HEART CATH AND CORONARY ANGIOGRAPHY;  Surgeon: Wellington Hampshire, MD;  Location: [facility];  Service: Cardiovascular;  Laterality: N/A;   MASTECTOMY     right side   REPLACEMENT TOTAL KNEE Bilateral    TUBAL LIGATION     Social History   Social History Narrative   Marital Status:  Married Manufacturing systems engineer)    Children:  Daughter(1) Son (1)    Pets:  None    Living Situation: Lives with husband and daughter.     Occupation:  Retired Licensed conveyancer)    Education: 12th Grade    Tobacco Use/Exposure:  She used to smoke socially during the weekends but quit 40 years ago.    Alcohol Use:  Occasional   Drug Use:  None   Diet:  Regular   Exercise:  None   Hobbies:  Reading and playing  volleyball             Immunization History  Administered Date(s) Administered   Fluad Quad(high Dose 65+) 08/11/2022   Influenza,inj,Quad PF,6+ Mos 08/30/2016   Influenza-Unspecified 08/18/2021   Moderna Covid-19 Vaccine Bivalent Booster 52yr & up 08/29/2021   Moderna Sars-Covid-2 Vaccination 12/25/2019, 01/11/2020, 02/05/2020   Pneumococcal Conjugate-13 05/22/2015   Pneumococcal Polysaccharide-23 10/06/2021     Objective: Vital Signs: BP 124/77 (BP Location: Left Arm, Patient Position: Sitting, Cuff Size: Large)   Pulse 77   Resp 14   Ht '5\' 1"'$  (1.549 m)   Wt 178 lb (80.7 kg)   BMI 33.63 kg/m    Physical Exam Vitals and nursing note reviewed.  Constitutional:      Appearance: She is well-developed.  HENT:     Head: Normocephalic and atraumatic.  Eyes:     Conjunctiva/sclera: Conjunctivae normal.  Cardiovascular:  Rate and Rhythm: Normal rate and regular rhythm.     Heart sounds: Normal heart sounds.  Pulmonary:     Effort: Pulmonary effort is normal.     Breath sounds: Normal breath sounds.  Abdominal:     General: Bowel sounds are normal.     Palpations: Abdomen is soft.  Musculoskeletal:     Cervical back: Normal range of motion.  Lymphadenopathy:     Cervical: No cervical adenopathy.  Skin:    General: Skin is warm and dry.     Capillary Refill: Capillary refill takes less than 2 seconds.  Neurological:     Mental Status: She is alert and oriented to person, place, and time.  Psychiatric:        Behavior: Behavior normal.      Musculoskeletal Exam: He had limited lateral rotation of the cervical spine.  She had limited painful range of motion of her lumbar spine.  She mobilizes with the help of a cane.  She had limited flexion and extension of the cervical spine.  Shoulder joints and elbow joints in good range of motion.  She had limited PIP and DIP extension bilaterally with no synovitis.  Hip joints were in good range of motion.  Knee joints were  replaced and were in good range of motion.  She had no tenderness over ankles or MTPs.  CDAI Exam: CDAI Score: -- Patient Global: --; Provider Global: -- Swollen: --; Tender: -- Joint Exam 08/14/2022   No joint exam has been documented for this visit   There is currently no information documented on the homunculus. Go to the Rheumatology activity and complete the homunculus joint exam.  Investigation: No additional findings.  Imaging: DG Chest 2 View  Result Date: 07/21/2022 CLINICAL DATA:  Cough and wheezing. EXAM: CHEST - 2 VIEW COMPARISON:  CT of the chest of December of 2022 chest x-ray December 2022. FINDINGS: Trachea midline. Moderate cardiac enlargement similar to prior imaging. Hilar structures with central pulmonary vascular engorgement. No lobar consolidation. No sign of pleural effusion. Subtle increased retrocardiac opacification. On limited assessment no acute skeletal process. IMPRESSION: 1. Moderate cardiac enlargement similar to prior imaging. 2. w subtle increased retrocardiac opacification could reflect atelectasis or developing infection. Electronically Signed   By: Zetta Bills M.D.   On: 07/21/2022 13:17    Recent Labs: Lab Results  Component Value Date   WBC 8.1 08/11/2022   HGB 12.9 08/11/2022   PLT 178.0 08/11/2022   NA 140 08/11/2022   K 4.0 08/11/2022   CL 102 08/11/2022   CO2 29 08/11/2022   GLUCOSE 156 (H) 08/11/2022   BUN 16 08/11/2022   CREATININE 1.20 08/11/2022   BILITOT 0.5 08/11/2022   ALKPHOS 110 08/11/2022   AST 14 08/11/2022   ALT 8 08/11/2022   PROT 7.2 08/11/2022   ALBUMIN 4.1 08/11/2022   CALCIUM 9.7 08/11/2022   GFRAA 51 (L) 06/03/2019   QFTBGOLDPLUS NEGATIVE 12/04/2021    Speciality Comments: No specialty comments available.  Procedures:  No procedures performed Allergies: Penicillins, Sulfa antibiotics, Amlodipine, and Lisinopril   Assessment / Plan:     Visit Diagnoses: Seropositive rheumatoid arthritis ([place]) - RF+,  Erosive arthritis, extensor tenosynovitis.  She had no synovitis or tenosynovitis on the examination today.  She has not been on immunosuppressive agents and she does not require immunosuppression.  Right hand paresthesia-patient states she was diagnosed with carpal tunnel syndrome about 2 years ago.  She has been having numbness and tingling in her  right first second and third fingers.  She is to wear a carpal tunnel syndrome brace.  I gave her a prescription for carpal tunnel brace today.  She has an orthopedic surgeon in [place].  I advised her if her symptoms persist she may make an appointment with the orthopedic surgeon.  Trochanteric bursitis, left hip-she intermittent discomfort.  Stretching exercises were discussed.  History of total bilateral knee replacement - Dr. Laurance Flatten in [facility] 2013.  She has been having increased discomfort in her knee joints.  She states she will see Dr. Laurance Flatten.  Other idiopathic scoliosis, thoracic region  DDD (degenerative disc disease), lumbar-she continues to have lower back pain.  She also complains of left-sided radiculopathy.  She states the pain is manageable.  Primary hypertension-blood pressure was normal today.  Other medical problems are listed as follows:  History of hyperlipidemia  Stage 3a chronic kidney disease (HCC)  Moderate nonproliferative diabetic retinopathy of left eye without macular edema associated with type 2 diabetes mellitus ([place])  Type 2 diabetes mellitus with diabetic polyneuropathy, without long-term current use of insulin (HCC)  Gastroesophageal reflux disease without esophagitis  History of breast cancer  Primary open angle glaucoma of both eyes, moderate stage  Orders: No orders of the defined types were placed in this encounter.  No orders of the defined types were placed in this encounter.    Follow-Up Instructions: Return in about 5 months (around 01/14/2023) for Osteoarthritis, Rheumatoid  arthritis.   Bo Merino, MD  Note - This record has been created using Editor, commissioning.  Chart creation errors have been sought, but may not always  have been located. Such creation errors do not reflect on  the standard of medical care.

## 2022-08-03 IMAGING — US US EXTREM LOW VENOUS
1 series · 13 of 24 positions shown · non-contrast
Comparison: None.

CLINICAL DATA: Bilateral lower extremity pain and edema. Evaluate
for DVT.



[Series 1: us extrem low venous · 13 of 61 slices shown]
[im 1/61]
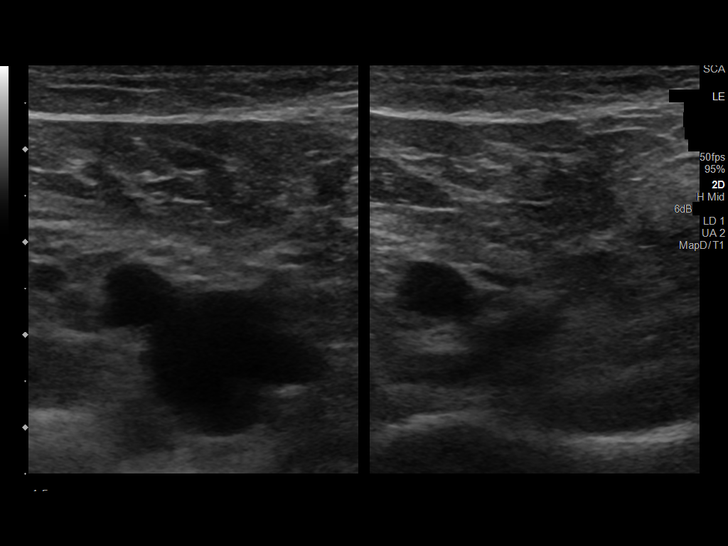
[im 6/61]
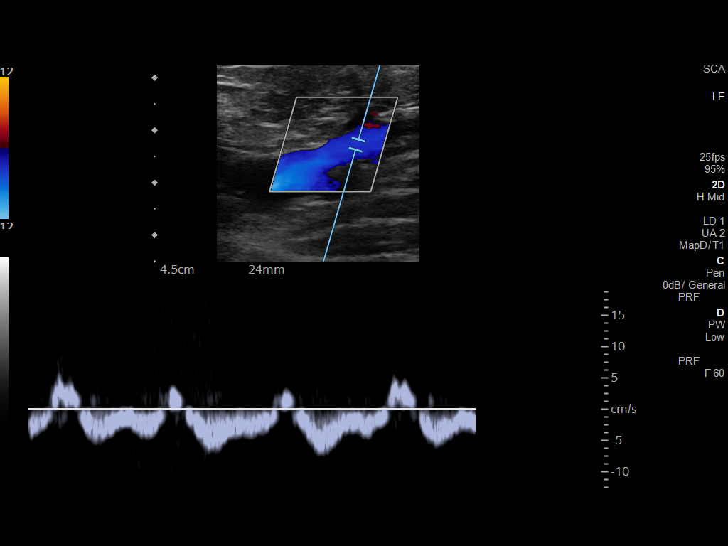
[im 11/61]
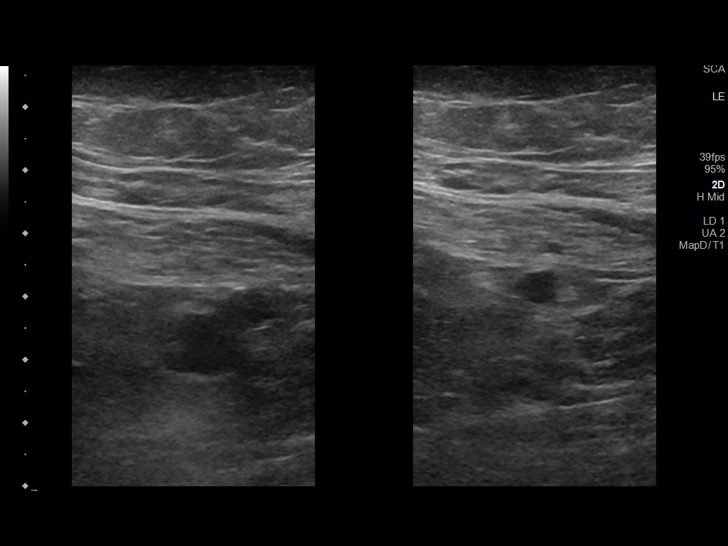
[im 16/61]
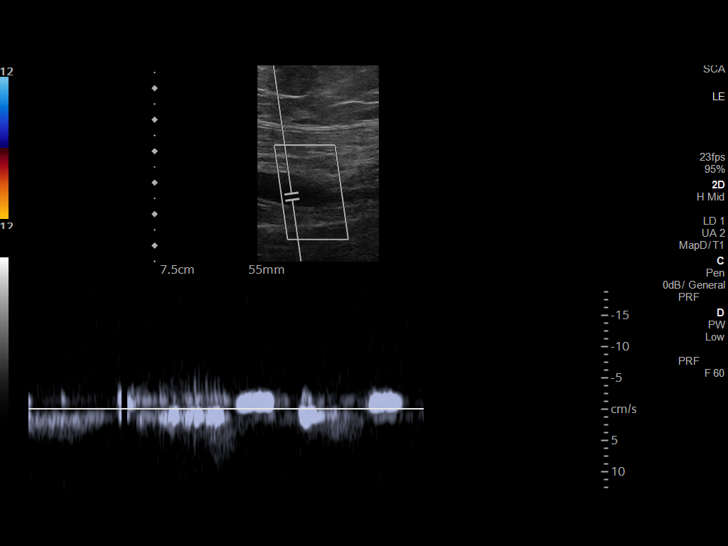
[im 21/61]
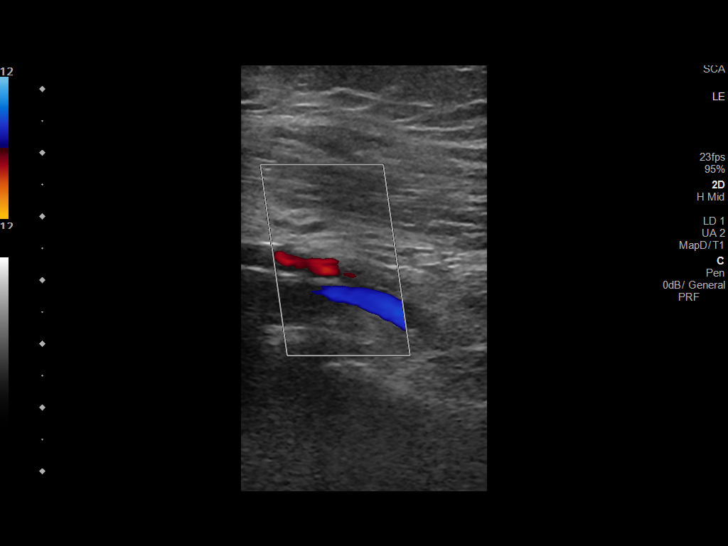
[im 27/61]
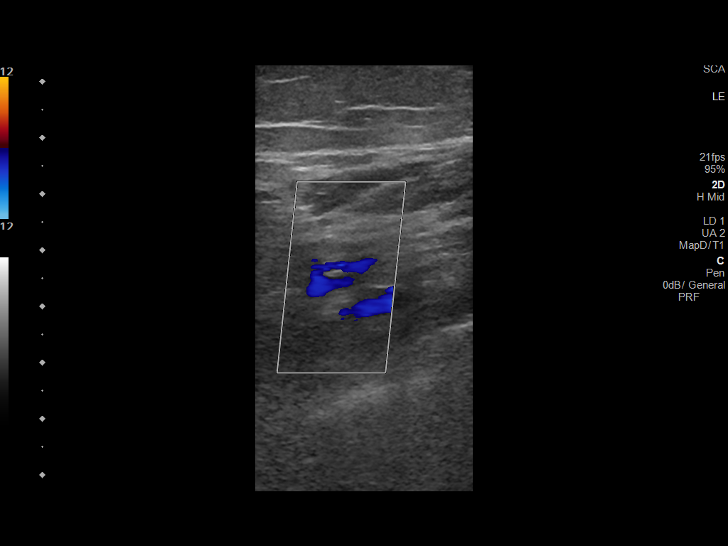
[im 32/61]
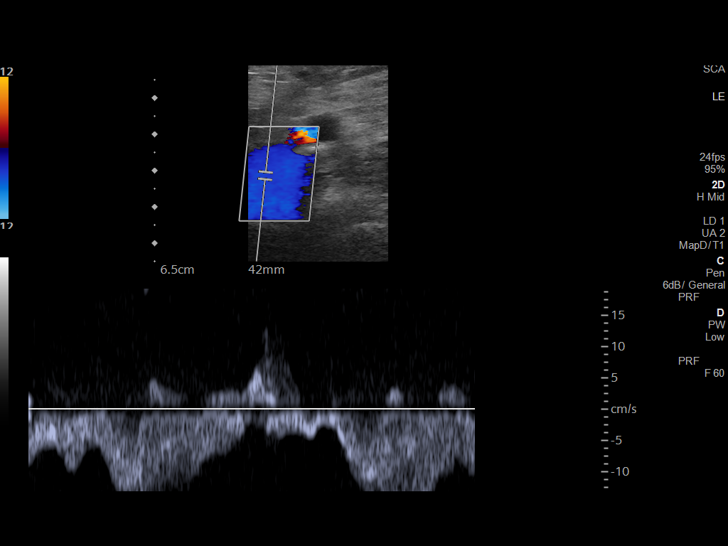
[im 34/61]
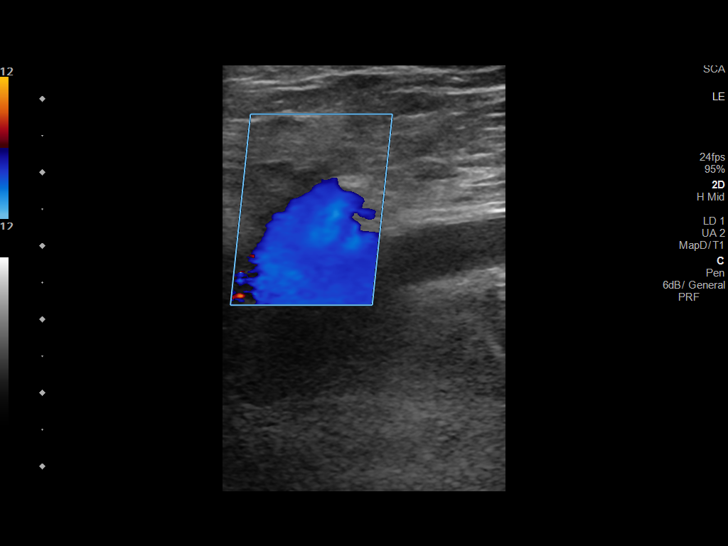
[im 40/61]
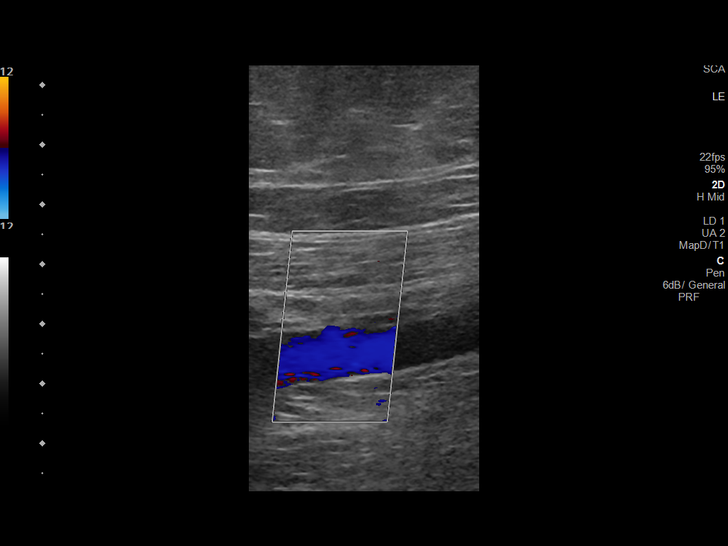
[im 45/61]
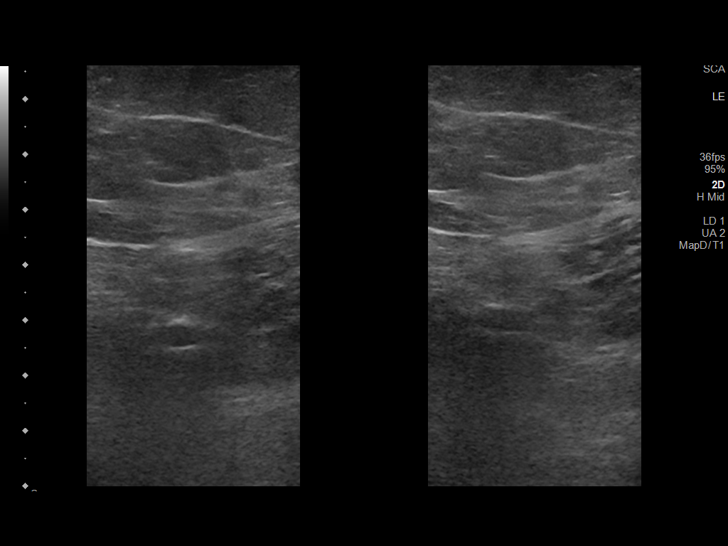
[im 50/61]
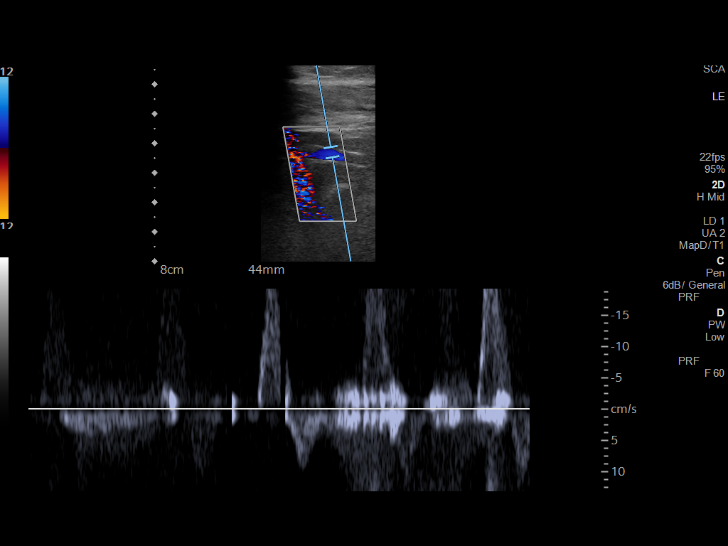
[im 55/61]
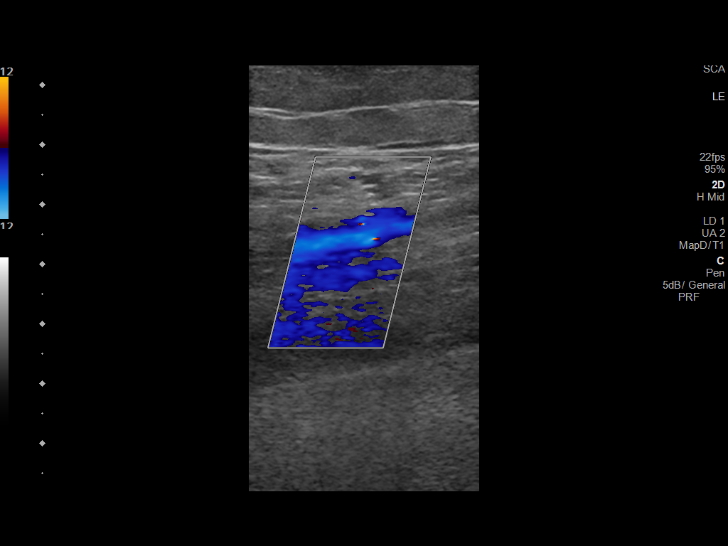
[im 61/61]
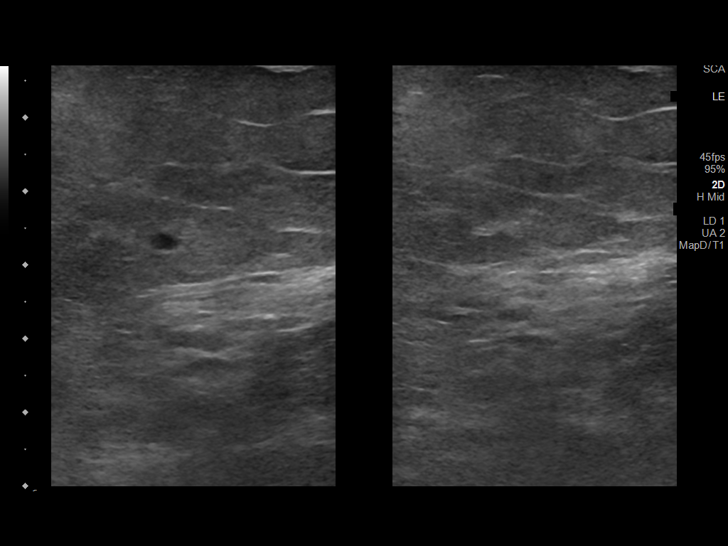

[13 of 24 positions shown; findings below may reference images not displayed]

FINDINGS: RIGHT LOWER EXTREMITY

Common Femoral Vein: No evidence of thrombus. Normal
compressibility, respiratory phasicity and response to augmentation.

Saphenofemoral Junction: No evidence of thrombus. Normal
compressibility and flow on color Doppler imaging.

Profunda Femoral Vein: No evidence of thrombus. Normal
compressibility and flow on color Doppler imaging.

Femoral Vein: No evidence of thrombus. Normal compressibility,
respiratory phasicity and response to augmentation.

Popliteal Vein: No evidence of thrombus. Normal compressibility,
respiratory phasicity and response to augmentation.

Calf Veins: No evidence of thrombus. Normal compressibility and flow
on color Doppler imaging.

Superficial Great Saphenous Vein: No evidence of thrombus. Normal
compressibility.

Venous Reflux:  None.

Other Findings:  None.

LEFT LOWER EXTREMITY

Common Femoral Vein: No evidence of thrombus. Normal
compressibility, respiratory phasicity and response to augmentation.

Saphenofemoral Junction: No evidence of thrombus. Normal
compressibility and flow on color Doppler imaging.

Profunda Femoral Vein: No evidence of thrombus. Normal
compressibility and flow on color Doppler imaging.

Femoral Vein: No evidence of thrombus. Normal compressibility,
respiratory phasicity and response to augmentation.

Popliteal Vein: No evidence of thrombus. Normal compressibility,
respiratory phasicity and response to augmentation.

Calf Veins: No evidence of thrombus. Normal compressibility and flow
on color Doppler imaging.

Superficial Great Saphenous Vein: No evidence of thrombus. Normal
compressibility.

Venous Reflux:  None.

Other Findings:  None.
IMPRESSION: No evidence of DVT within either lower extremity.

## 2022-08-03 IMAGING — DX DG CHEST 2V
2 series · 2 of 2 positions shown · non-contrast
Comparison: 06/02/2019

CLINICAL DATA: Pedal edema.

EXAM:
CHEST - 2 VIEW

[chest pa]
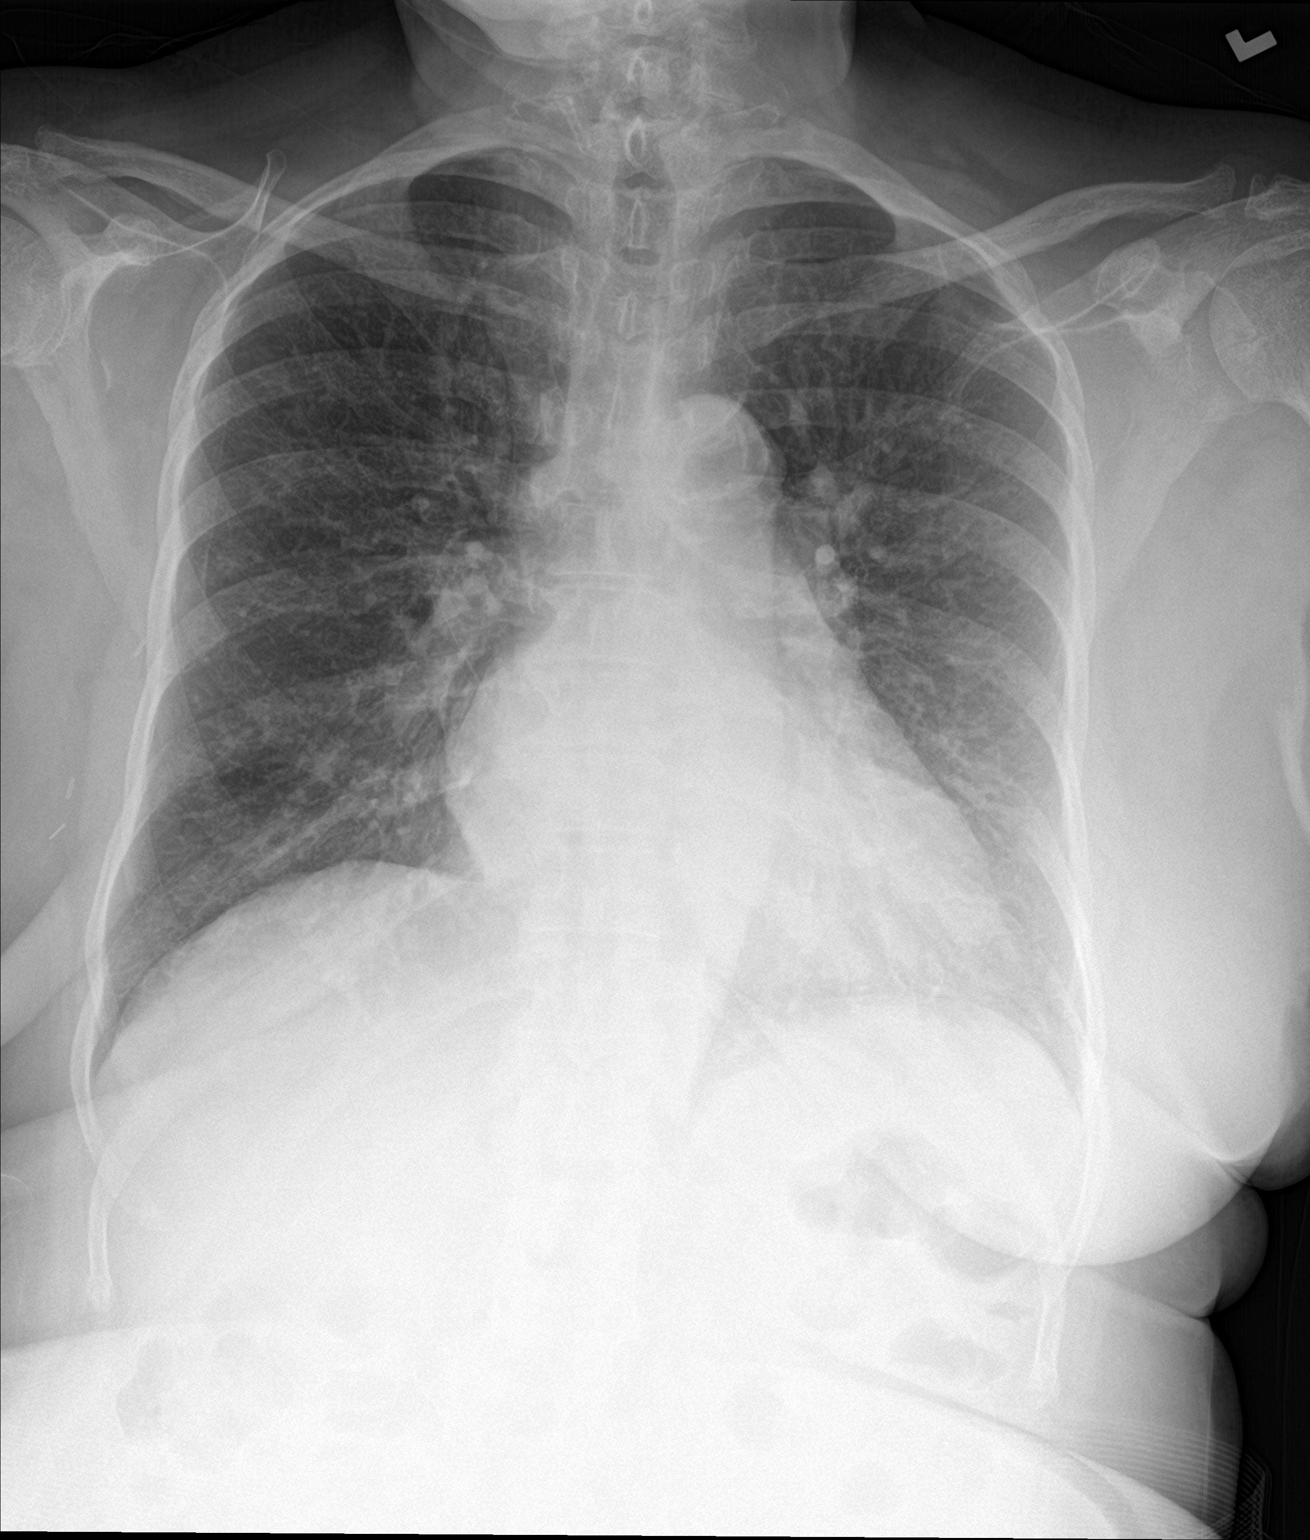

[chest lat]
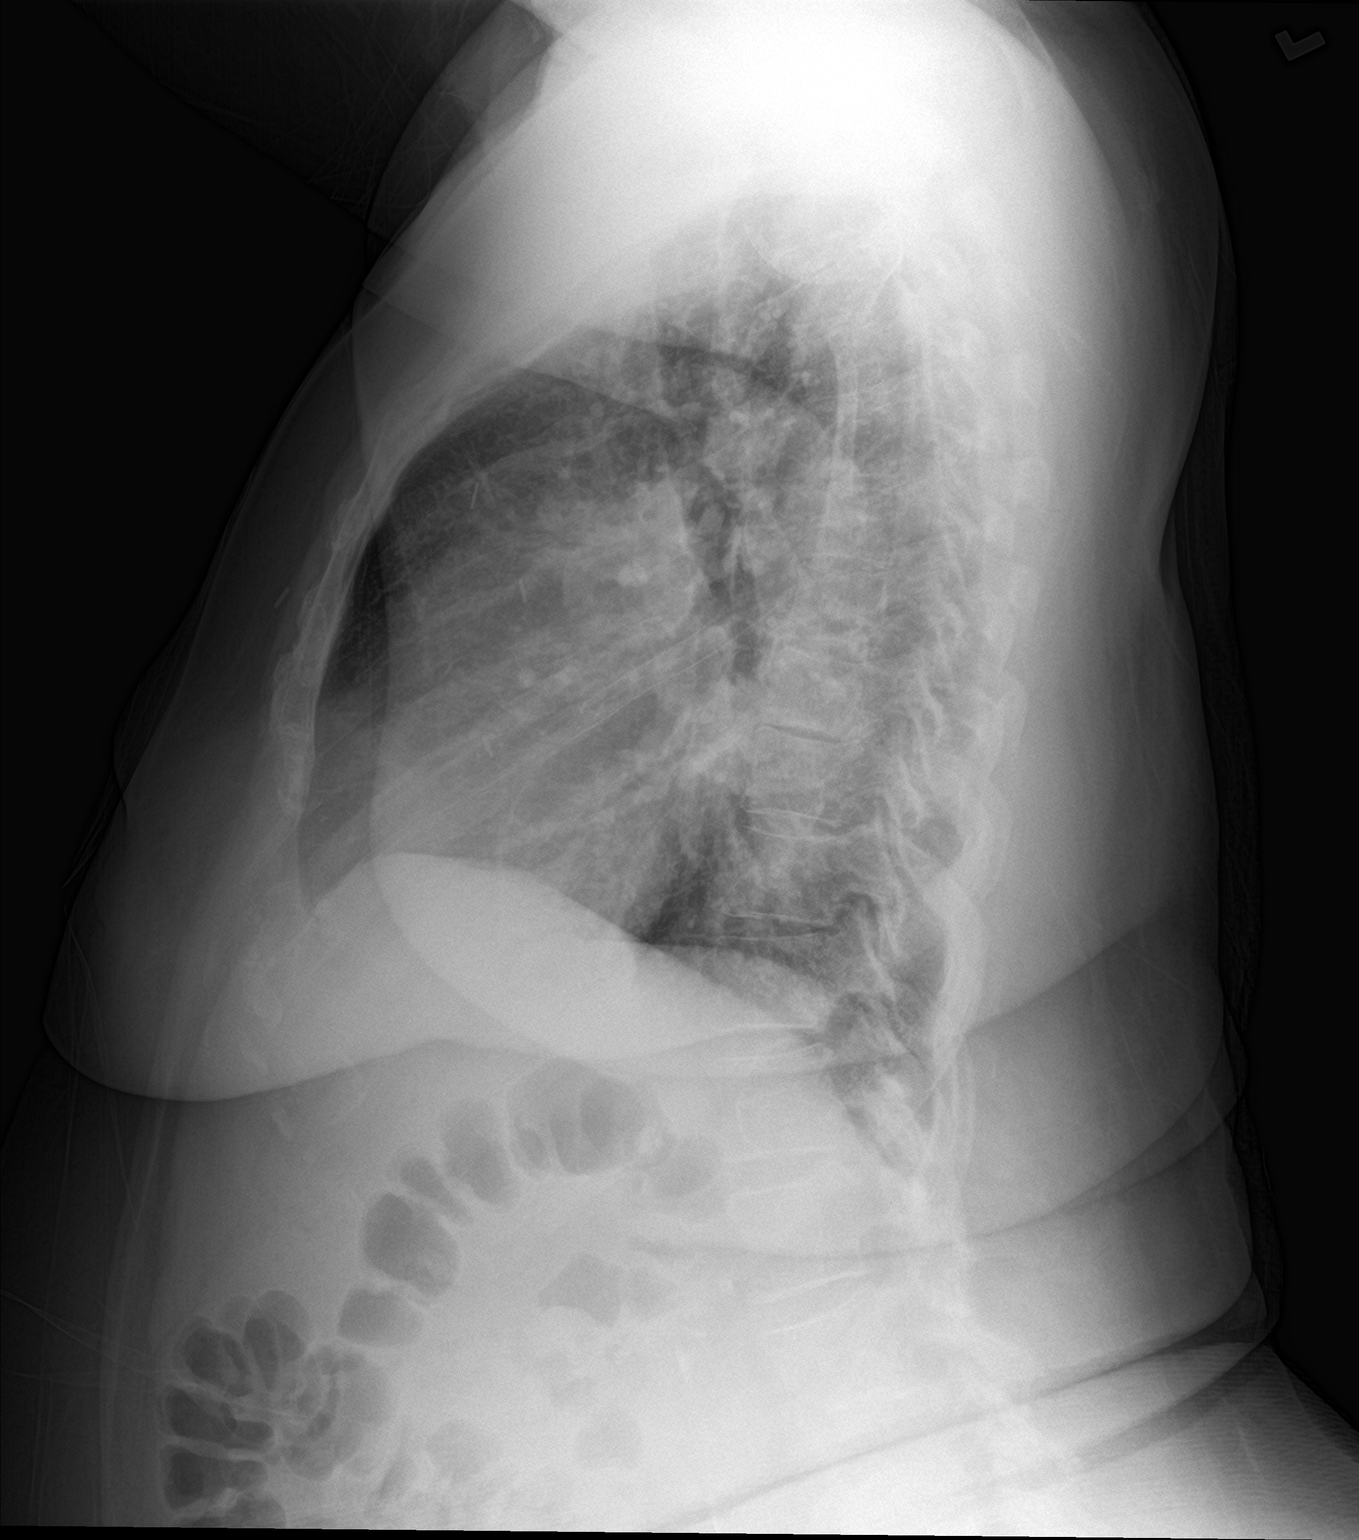

[2 of 2 positions shown; findings below may reference images not displayed]

FINDINGS: Normal cardiac silhouette. Mild central venous congestion. No
effusion, infiltrate or pneumothorax. No acute osseous abnormality.
IMPRESSION: Mild pulmonary venous congestion.

## 2022-08-03 IMAGING — DX DG FOOT COMPLETE 3+V*L*
3 series · 3 of 3 positions shown · non-contrast
Comparison: None.

CLINICAL DATA: LEFT calcaneus pain

EXAM:
LEFT FOOT - COMPLETE 3+ VIEW

[foot ap]
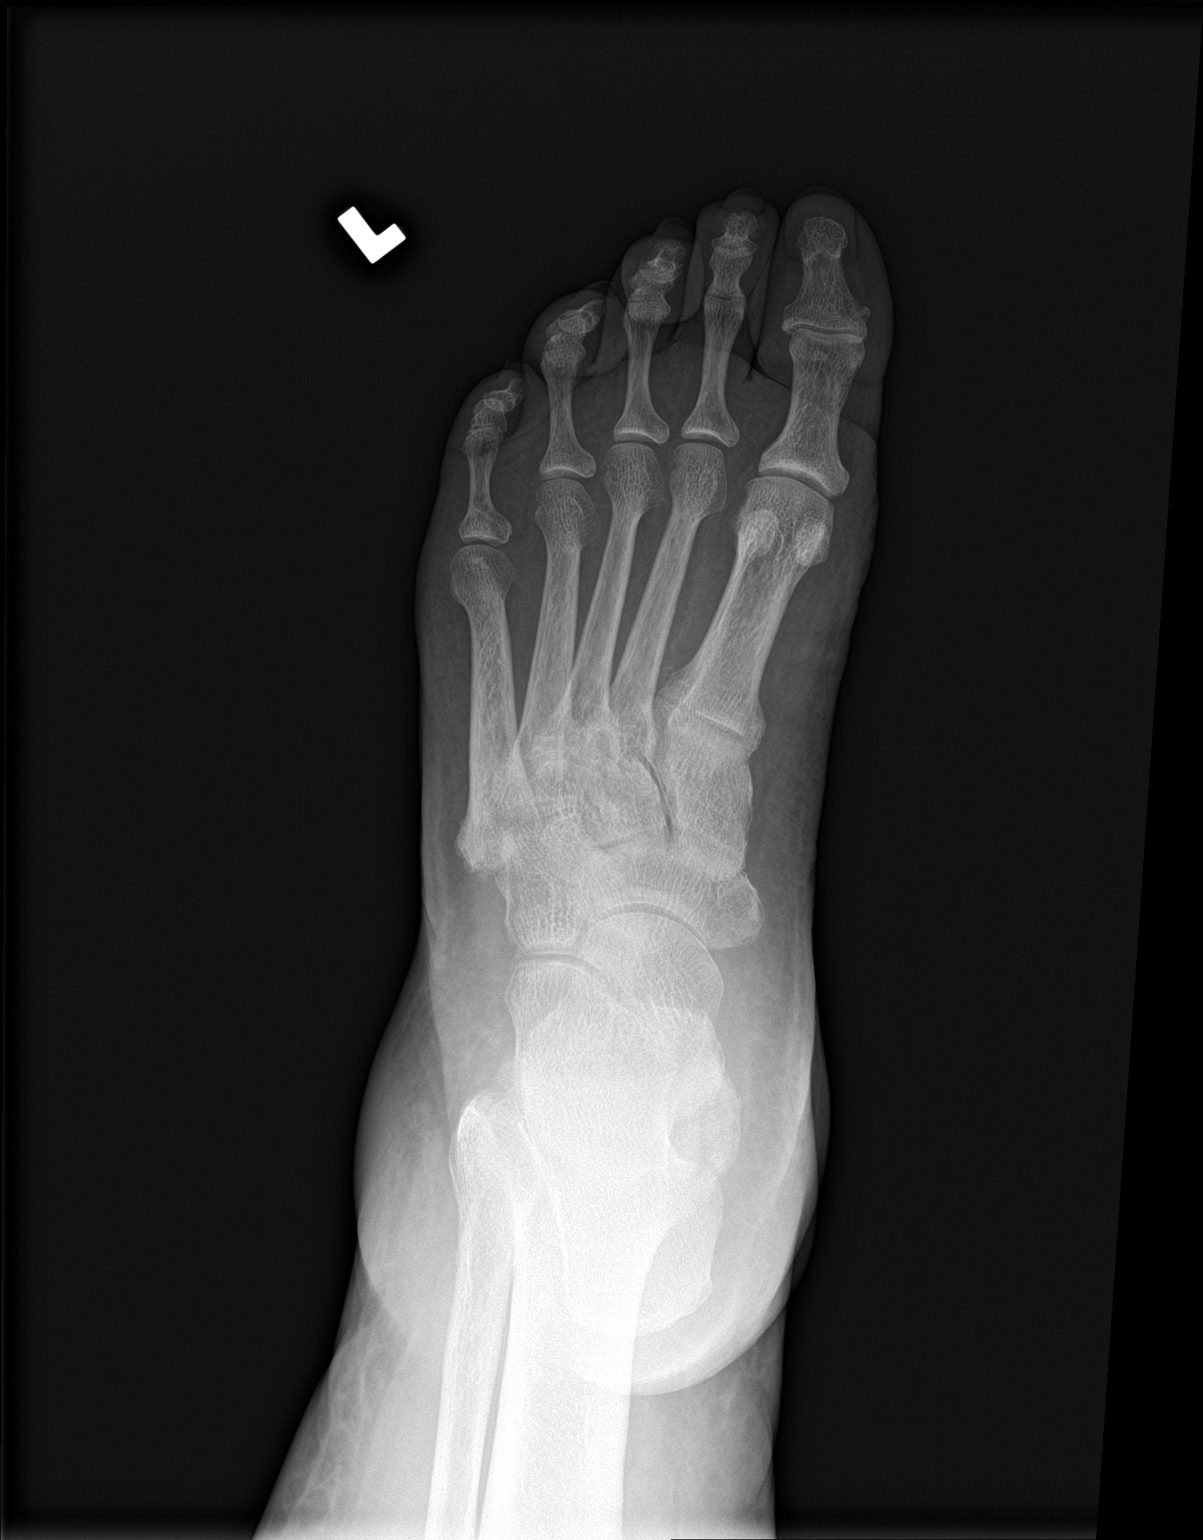

[foot obl]
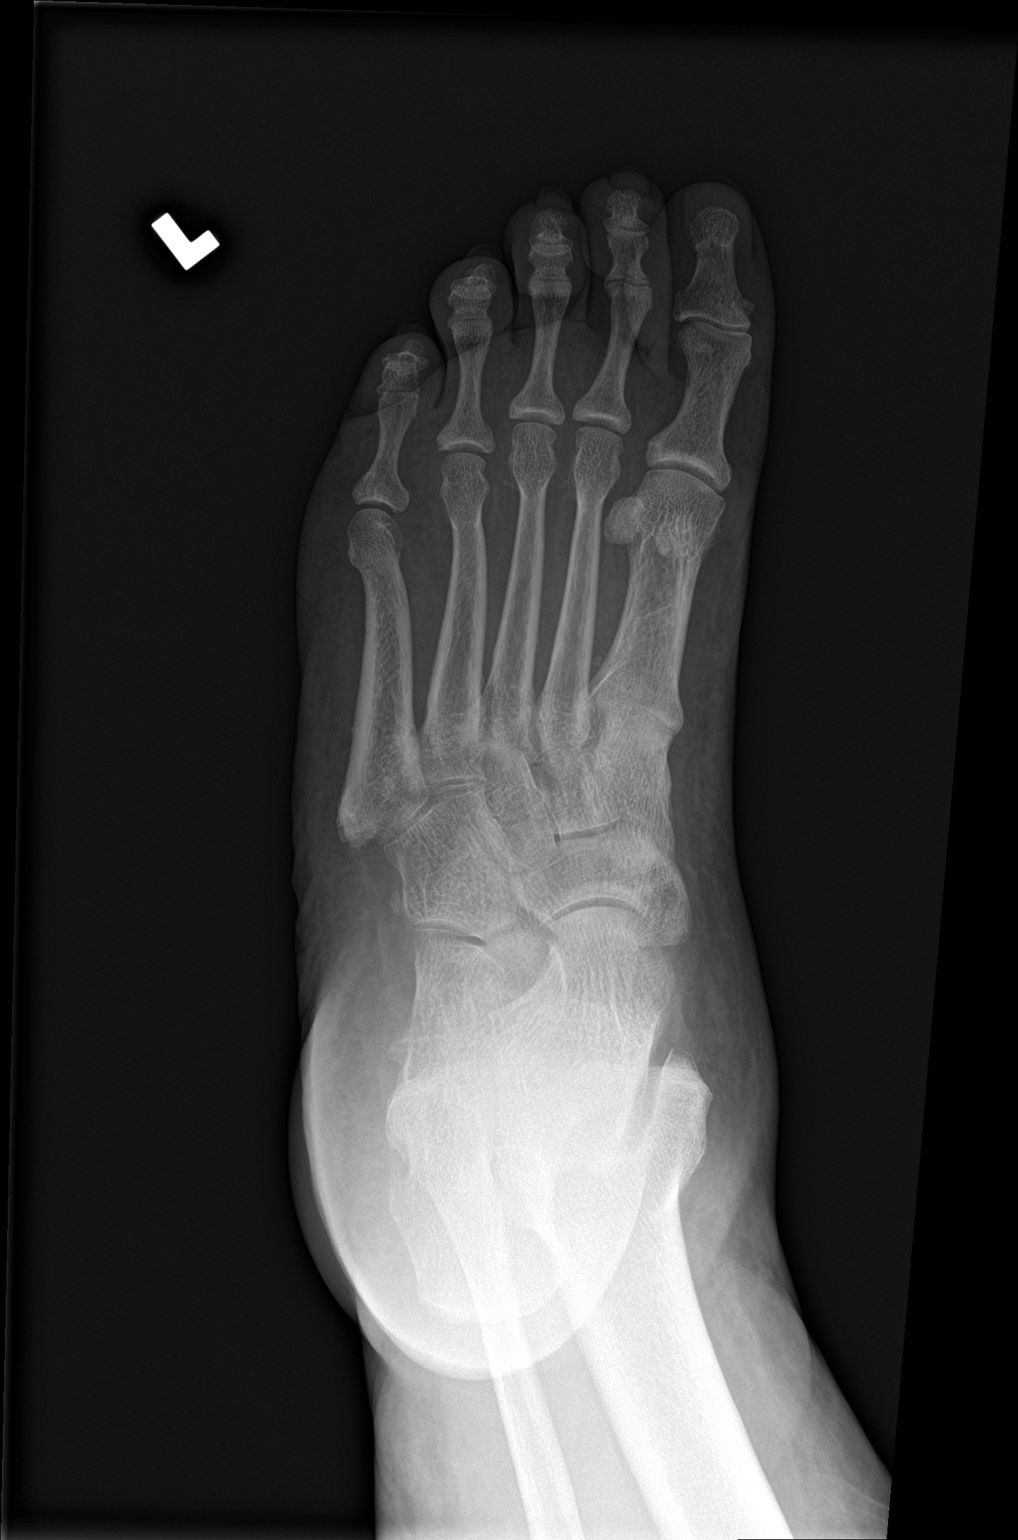

[foot lat]
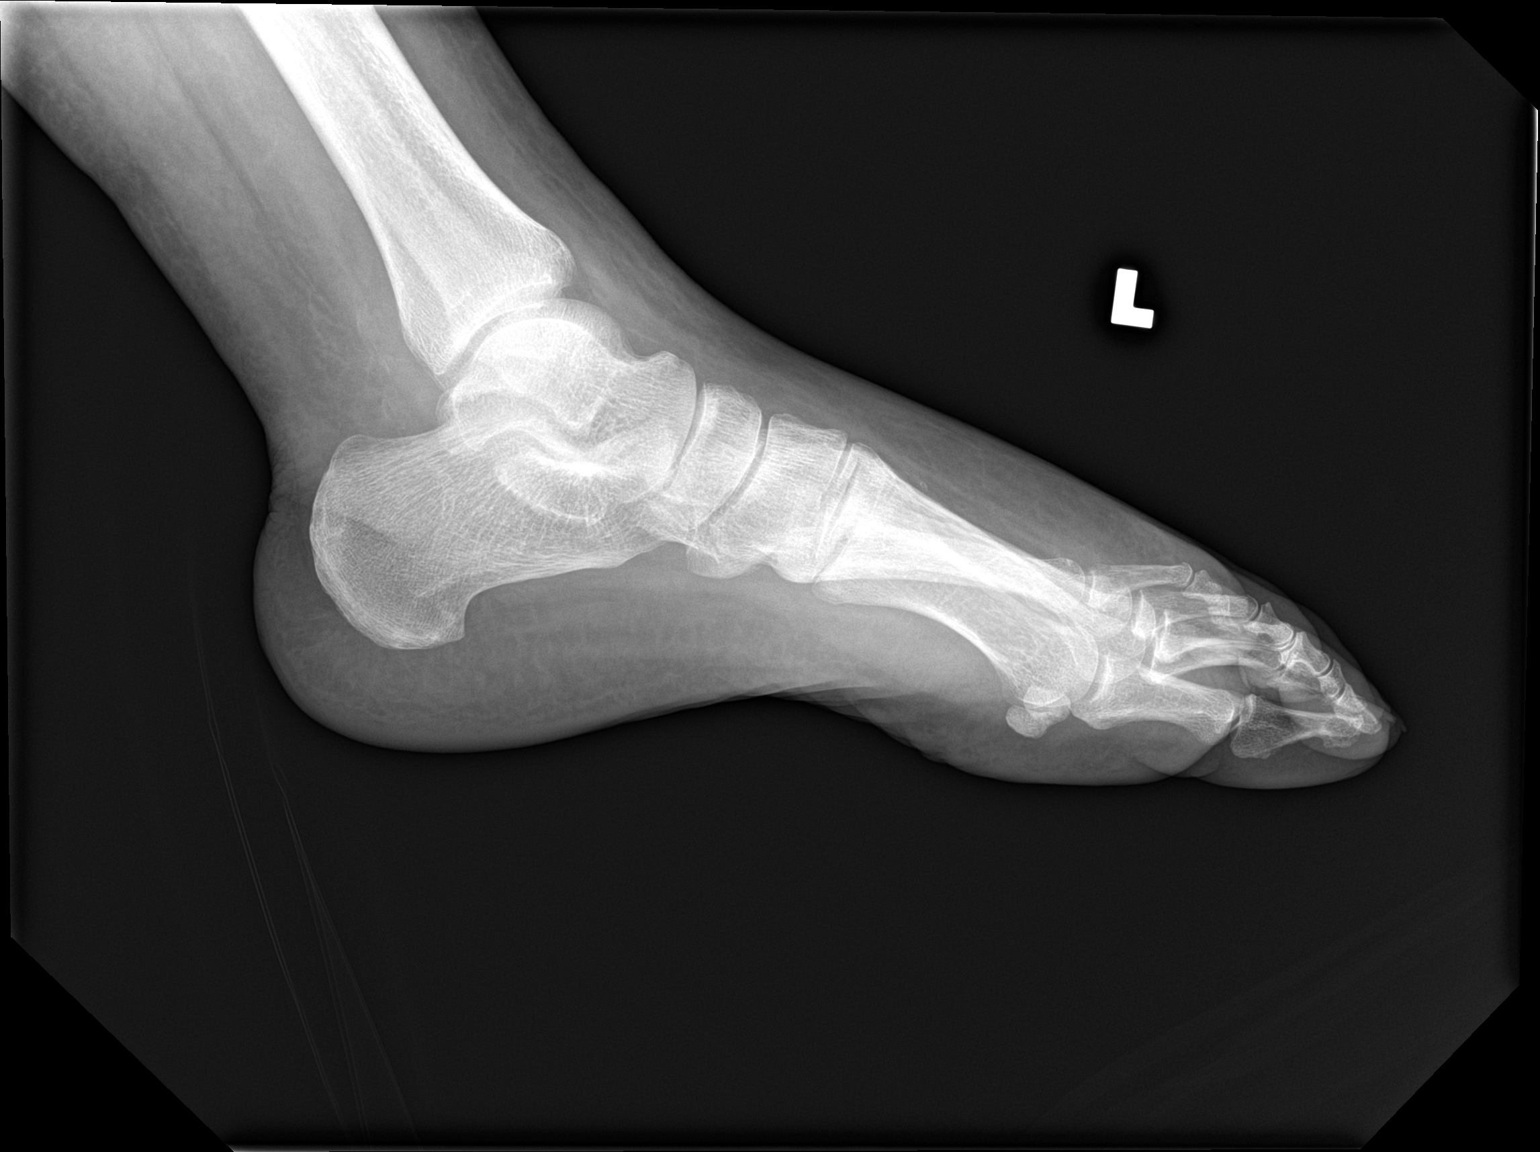

[3 of 3 positions shown; findings below may reference images not displayed]

FINDINGS: There is no evidence of fracture or dislocation. There is no
evidence of arthropathy or other focal bone abnormality. Soft tissue
swelling of the dorsum of the foot.
IMPRESSION: Negative radiograph of the calcaneus. Soft tissue swelling of the
dorsum foot.

## 2022-08-06 ENCOUNTER — Other Ambulatory Visit: Payer: Self-pay | Admitting: Medical

## 2022-08-11 ENCOUNTER — Other Ambulatory Visit (INDEPENDENT_AMBULATORY_CARE_PROVIDER_SITE_OTHER): Payer: HMO

## 2022-08-11 ENCOUNTER — Ambulatory Visit (INDEPENDENT_AMBULATORY_CARE_PROVIDER_SITE_OTHER): Payer: HMO | Admitting: Medical

## 2022-08-11 VITALS — BP 135/65 | HR 81 | Temp 97.9°F | Resp 18 | Ht 61.0 in | Wt 181.6 lb

## 2022-08-11 DIAGNOSIS — Z23 Encounter for immunization: Secondary | ICD-10-CM

## 2022-08-11 DIAGNOSIS — R1310 Dysphagia, unspecified: Secondary | ICD-10-CM

## 2022-08-11 DIAGNOSIS — R1013 Epigastric pain: Secondary | ICD-10-CM

## 2022-08-11 DIAGNOSIS — K219 Gastro-esophageal reflux disease without esophagitis: Secondary | ICD-10-CM

## 2022-08-11 LAB — CBC WITH DIFFERENTIAL/PLATELET
Basophils Absolute: 0 10*3/uL (ref 0.0–0.1)
Basophils Relative: 0.5 % (ref 0.0–3.0)
Eosinophils Absolute: 0.1 10*3/uL (ref 0.0–0.7)
Eosinophils Relative: 1 % (ref 0.0–5.0)
HCT: 39.7 % (ref 36.0–46.0)
Hemoglobin: 12.9 g/dL (ref 12.0–15.0)
Lymphocytes Relative: 25.1 % (ref 12.0–46.0)
Lymphs Abs: 2 10*3/uL (ref 0.7–4.0)
MCHC: 32.4 g/dL (ref 30.0–36.0)
MCV: 92.2 fl (ref 78.0–100.0)
Monocytes Absolute: 0.5 10*3/uL (ref 0.1–1.0)
Monocytes Relative: 6.7 % (ref 3.0–12.0)
Neutro Abs: 5.4 10*3/uL (ref 1.4–7.7)
Neutrophils Relative %: 66.7 % (ref 43.0–77.0)
Platelets: 178 10*3/uL (ref 150.0–400.0)
RBC: 4.31 Mil/uL (ref 3.87–5.11)
RDW: 15 % (ref 11.5–15.5)
WBC: 8.1 10*3/uL (ref 4.0–10.5)

## 2022-08-11 LAB — COMPREHENSIVE METABOLIC PANEL
ALT: 8 U/L (ref 0–35)
AST: 14 U/L (ref 0–37)
Albumin: 4.1 g/dL (ref 3.5–5.2)
Alkaline Phosphatase: 110 U/L (ref 39–117)
BUN: 16 mg/dL (ref 6–23)
CO2: 29 mEq/L (ref 19–32)
Calcium: 9.7 mg/dL (ref 8.4–10.5)
Chloride: 102 mEq/L (ref 96–112)
Creatinine, Ser: 1.2 mg/dL (ref 0.40–1.20)
GFR: 42.71 mL/min — ABNORMAL LOW (ref >60.00)
Glucose, Bld: 156 mg/dL — ABNORMAL HIGH (ref 70–99)
Potassium: 4 mEq/L (ref 3.5–5.1)
Sodium: 140 mEq/L (ref 135–145)
Total Bilirubin: 0.5 mg/dL (ref 0.2–1.2)
Total Protein: 7.2 g/dL (ref 6.0–8.3)

## 2022-08-11 LAB — LIPASE: Lipase: 31 U/L (ref 11.0–59.0)

## 2022-08-11 MED ORDER — SUCRALFATE 1 G PO TABS: 1.0000 g | ORAL_TABLET | Freq: Three times a day (TID) | ORAL | 0 refills | Status: DC

## 2022-08-11 NOTE — Progress Notes (Signed)
Subjective:    Patient ID: Donna Fuller, female    DOB: 1941/04/05, 81 y.o.   MRN: [id number]  HPI  Pt in for some recent upset stomach. Pt attributed gi upset to new below diuretic. Though her symptoms are still persisting.   Plan: 1.  Scheduled patient for barium esophagram with tablet. 2.  Pending above we will consider EGD versus referral to speech pathology. 3.  Increased Omeprazole to 40 mg twice daily, 30-60 minutes before breakfast and dinner.  #60 with 5 refills. 4.  Continue Pepcid 20 mg nightly 5.  Reviewed antidysphagia measures including taking small bites, drinking sips of water in between bites, avoiding distraction while eating and the chin tuck technique. 6.  Patient to follow in clinic per recommendations after imaging above.  She was assigned to Dr. Lyndel Safe today.    Pt saw cardiologist recently and they gave her diuretic. Pt states when she first started that med upset her stomach. She states she stopped that med. She has yet to notify her cardiologist. Lower ext- no pedal edema today.    Review of Systems  Constitutional:  Negative for chills, fatigue and fever.  Respiratory:  Negative for cough, chest tightness, shortness of breath and wheezing.   Cardiovascular:  Negative for chest pain and palpitations.  Gastrointestinal:  Positive for abdominal pain. Negative for abdominal distention, blood in stool, constipation, diarrhea and nausea.  Genitourinary:  Negative for flank pain, urgency and vaginal bleeding.  Musculoskeletal:  Negative for back pain.  Skin:  Negative for rash.    Past Medical History:  Diagnosis Date   Arthritis    Breast cancer ([place]) 2007   Right breast, s/p mastectomy   Diabetes mellitus without complication (HCC)    GERD (gastroesophageal reflux disease)    Gout    Hyperlipidemia    Hypertension    Spinal stenosis      Social History   Socioeconomic History   Marital status: Married    Spouse name: Jimmy   Number of  children: 3   Years of education: 12   Highest education level: Not on file  Occupational History   Not on file  Tobacco Use   Smoking status: Former    Years: 5.00    Types: Cigarettes    Passive exposure: Never   Smokeless tobacco: Never  Vaping Use   Vaping Use: Never used  Substance and Sexual Activity   Alcohol use: No   Drug use: No   Sexual activity: Not Currently  Other Topics Concern   Not on file  Social History Narrative   Marital Status:  Married Manufacturing systems engineer)    Children:  Daughter(1) Son (1)    Pets:  None    Living Situation: Lives with husband and daughter.     Occupation:  Retired Licensed conveyancer)    Education: 12th Grade    Tobacco Use/Exposure:  She used to smoke socially during the weekends but quit 40 years ago.    Alcohol Use:  Occasional   Drug Use:  None   Diet:  Regular   Exercise:  None   Hobbies:  Reading and playing volleyball             Social Determinants of Health   Financial Resource Strain: Low Risk  (10/01/2021)   Overall Financial Resource Strain (CARDIA)    Difficulty of Paying Living Expenses: Not hard at all  Food Insecurity: No Food Insecurity (10/01/2021)   Hunger Vital Sign    Worried About  Running Out of Food in the Last Year: Never true    [place] in the Last Year: Never true  Transportation Needs: No Transportation Needs (10/01/2021)   PRAPARE - Hydrologist (Medical): No    Lack of Transportation (Non-Medical): No  Physical Activity: Inactive (10/01/2021)   Exercise Vital Sign    Days of Exercise per Week: 0 days    Minutes of Exercise per Session: 0 min  Stress: No Stress Concern Present (10/01/2021)   [place]    Feeling of Stress : Not at all  Social Connections: Moderately Integrated (10/01/2021)   Social Connection and Isolation Panel [NHANES]    Frequency of Communication with Friends and Family: More than three times a week     Frequency of Social Gatherings with Friends and Family: More than three times a week    Attends Religious Services: More than 4 times per year    Active Member of [employer] or Organizations: No    Attends Archivist Meetings: Never    Marital Status: Married  Human resources officer Violence: Not At Risk (10/01/2021)   Humiliation, Afraid, Rape, and Kick questionnaire    Fear of Current or Ex-Partner: No    Emotionally Abused: No    Physically Abused: No    Sexually Abused: No    Past Surgical History:  Procedure Laterality Date   ABDOMINAL HYSTERECTOMY     BREAST BIOPSY Left    2016 benign    LEFT HEART CATH AND CORONARY ANGIOGRAPHY N/A 11/21/2021   Procedure: LEFT HEART CATH AND CORONARY ANGIOGRAPHY;  Surgeon: Wellington Hampshire, MD;  Location: [facility];  Service: Cardiovascular;  Laterality: N/A;   MASTECTOMY     right side   REPLACEMENT TOTAL KNEE Bilateral    TUBAL LIGATION      Family History  Problem Relation Age of Onset   Diabetes Mother    Alzheimer's disease Mother    Hypertension Mother    Hypertension Father    Heart disease Father    Hyperlipidemia Father    Kidney disease Father    Diabetes Sister    Cancer Brother        lung   Alzheimer's disease Brother    Heart disease Brother    Hypertension Brother    Diabetes Brother    Hypertension Brother    Diabetes Brother    Hyperlipidemia Son    Hypertension Son    Diabetes Son    Hypertension Daughter    Hyperlipidemia Daughter     Allergies  Allergen Reactions   Penicillins Swelling   Sulfa Antibiotics Swelling   Amlodipine Swelling    LE Swelling   Lisinopril Cough    Current Outpatient Medications on File Prior to Visit  Medication Sig Dispense Refill   acetaminophen (TYLENOL) 650 MG CR tablet Take 650 mg by mouth 2 (two) times daily.     albuterol (VENTOLIN HFA) 108 (90 Base) MCG/ACT inhaler TAKE 2 PUFFS BY MOUTH EVERY 6 HOURS AS NEEDED 18 each 2   ascorbic acid (VITAMIN C) 500  MG tablet Take 500 mg by mouth daily.     aspirin 81 MG tablet Take 81 mg by mouth daily.     Biotin 1000 MCG CHEW Chew by mouth.     blood glucose meter kit and supplies KIT Dispense based on patient and insurance preference. Use up to four times daily as directed. (  FOR ICD-9 250.00, 250.01). 1 each 11   brimonidine (ALPHAGAN) 0.2 % ophthalmic solution Place 1 drop into both eyes 2 (two) times a day.     Cholecalciferol (VITAMIN D-3) 5000 units TABS Take 1 tablet by mouth daily.     Dulaglutide (TRULICITY) 3 IO/2.7OJ SOPN Inject 3 mg as directed once a week. 6 mL 3   DULoxetine (CYMBALTA) 30 MG capsule TAKE 1 CAPSULE BY MOUTH EVERY DAY 90 capsule 2   famotidine (PEPCID) 20 MG tablet TAKE 1 TABLET BY MOUTH EVERY DAY 90 tablet 1   fluticasone (FLONASE) 50 MCG/ACT nasal spray SPRAY 2 SPRAYS INTO EACH NOSTRIL EVERY DAY 16 mL 1   gabapentin (NEURONTIN) 300 MG capsule TAKE 1 CAPSULE BY MOUTH THREE TIMES A DAY 90 capsule 2   glipiZIDE (GLUCOTROL) 5 MG tablet Take 1 tablet (5 mg total) by mouth daily before breakfast AND 0.5 tablets (2.5 mg total) daily before supper. 135 tablet 3   ketorolac (ACULAR) 0.5 % ophthalmic solution INSTILL 1 DROP INTO RIGHT EYE 4 TIMES A DAY 5 mL 6   latanoprost (XALATAN) 0.005 % ophthalmic solution Place 1 drop into both eyes daily.     levocetirizine (XYZAL) 5 MG tablet Take 1 tablet (5 mg total) by mouth every evening. 30 tablet 3   losartan (COZAAR) 50 MG tablet Take 50 mg by mouth daily.     metoprolol succinate (TOPROL-XL) 25 MG 24 hr tablet Take 25 mg by mouth daily.     montelukast (SINGULAIR) 10 MG tablet Take 10 mg by mouth at bedtime.     omeprazole (PRILOSEC) 40 MG capsule Take 1 capsule (40 mg total) by mouth 2 (two) times daily. Must keep appt on 12/06/16 with Dr. Caryl Bis 60 capsule 3   OneTouch Delica Lancets 50K MISC Use as instructed to check blood sugar 2-3 times a day.  DX E11.9 100 each 3   ONETOUCH ULTRA test strip CHECK BLOOD SUGAR 2 TO 3 TIMES DAILY  AS DIRECTED 100 strip 5   pravastatin (PRAVACHOL) 20 MG tablet TAKE 1 TABLET BY MOUTH ONCE DAILY (Patient taking differently: Take 20 mg by mouth daily.) 90 tablet 0   timolol (TIMOPTIC) 0.5 % ophthalmic solution Place 1 drop into both eyes 2 (two) times a day.     No current facility-administered medications on file prior to visit.    BP 135/65 (BP Location: Left Arm, Patient Position: Sitting, Cuff Size: Large)   Pulse 81   Temp 97.9 F (36.6 C) (Temporal)   Resp 18   Ht '5\' 1"'  (1.549 m)   Wt 181 lb 9.6 oz (82.4 kg)   SpO2 99%   BMI 34.31 kg/m        Objective:   Physical Exam  General- No acute distress. Pleasant patient. Neck- Full range of motion, no jvd Lungs- Clear, even and unlabored. Heart- regular rate and rhythm. Neurologic- CNII- XII grossly intact.  Abdomen-Mild epigastric tenderness to palpation.  No rebound or guarding no organomegaly. Back-no CVA tenderness  Lower extremity-no pedal edema.  Negative Homans' sign bilaterally.      Assessment & Plan:   Patient Instructions  Reviewed recent office note with GI MD.  History of GERD and dysphagia.  Over the last couple weeks having upset stomach/epigastric pain.  Continue omeprazole 40 mg twice daily and Pepcid 20 mg at night.  Decided to add Carafate 3 times daily to current regimen.  Hopefully this will help your epigastric discomfort.  We will also get CBC,  CMP and lipase today.  I asked for update this Friday or upcoming Monday.  If signs symptoms worsen or change despite that then recommend emergency department evaluation. After update from you might try to get you back in with your GI MD.  Presently no signs of CHF.  No pedal edema.  You reported that around the time cardiologist recommended you new  diuretic that your stomach was upset.  I do not think that any diuretic would have direct GI effects.  Also do not think it would last this long as well since stopped that medication.  Want you to call back to  your cardiologist and have discussion regarding your concerns about potential side effects.  They might give  alternative medication.  But I do not want you to have CHF flare due to not taking recommended class of medication.  Follow-up in 2 weeks or sooner if needed.   Mackie Pai, PA-C

## 2022-08-11 NOTE — Patient Instructions (Addendum)
Reviewed recent office note with GI MD.  History of GERD and dysphagia.  Over the last couple weeks having upset stomach/epigastric pain.  Continue omeprazole 40 mg twice daily and Pepcid 20 mg at night.  Decided to add Carafate 3 times daily to current regimen.  Hopefully this will help your epigastric discomfort.  We will also get CBC, CMP and lipase today.  I asked for update this Friday or upcoming Monday.  If signs symptoms worsen or change despite that then recommend emergency department evaluation. After update from you might try to get you back in with your GI MD.  Presently no signs of CHF.  No pedal edema.  You reported that around the time cardiologist recommended you new  diuretic that your stomach was upset.  I do not think that any diuretic would have direct GI effects.  Also do not think it would last this long as well since stopped that medication.  Want you to call back to your cardiologist and have discussion regarding your concerns about potential side effects.  They might give  alternative medication.  But I do not want you to have CHF flare due to not taking recommended class of medication.  Follow-up in 2 weeks or sooner if needed.

## 2022-08-14 ENCOUNTER — Encounter: Payer: Self-pay | Admitting: Rheumatology

## 2022-08-14 ENCOUNTER — Ambulatory Visit: Payer: HMO | Attending: Rheumatology | Admitting: Rheumatology

## 2022-08-14 VITALS — BP 124/77 | HR 77 | Resp 14 | Ht 61.0 in | Wt 178.0 lb

## 2022-08-14 DIAGNOSIS — Z8639 Personal history of other endocrine, nutritional and metabolic disease: Secondary | ICD-10-CM | POA: Diagnosis not present

## 2022-08-14 DIAGNOSIS — M059 Rheumatoid arthritis with rheumatoid factor, unspecified: Secondary | ICD-10-CM

## 2022-08-14 DIAGNOSIS — M5136 Other intervertebral disc degeneration, lumbar region: Secondary | ICD-10-CM

## 2022-08-14 DIAGNOSIS — M51369 Other intervertebral disc degeneration, lumbar region without mention of lumbar back pain or lower extremity pain: Secondary | ICD-10-CM

## 2022-08-14 DIAGNOSIS — K219 Gastro-esophageal reflux disease without esophagitis: Secondary | ICD-10-CM

## 2022-08-14 DIAGNOSIS — R202 Paresthesia of skin: Secondary | ICD-10-CM | POA: Diagnosis not present

## 2022-08-14 DIAGNOSIS — M4124 Other idiopathic scoliosis, thoracic region: Secondary | ICD-10-CM | POA: Diagnosis not present

## 2022-08-14 DIAGNOSIS — M7062 Trochanteric bursitis, left hip: Secondary | ICD-10-CM | POA: Diagnosis not present

## 2022-08-14 DIAGNOSIS — E1142 Type 2 diabetes mellitus with diabetic polyneuropathy: Secondary | ICD-10-CM

## 2022-08-14 DIAGNOSIS — I10 Essential (primary) hypertension: Secondary | ICD-10-CM

## 2022-08-14 DIAGNOSIS — N1831 Chronic kidney disease, stage 3a: Secondary | ICD-10-CM | POA: Diagnosis not present

## 2022-08-14 DIAGNOSIS — E113392 Type 2 diabetes mellitus with moderate nonproliferative diabetic retinopathy without macular edema, left eye: Secondary | ICD-10-CM | POA: Diagnosis not present

## 2022-08-14 DIAGNOSIS — H401132 Primary open-angle glaucoma, bilateral, moderate stage: Secondary | ICD-10-CM

## 2022-08-14 DIAGNOSIS — Z96653 Presence of artificial knee joint, bilateral: Secondary | ICD-10-CM | POA: Diagnosis not present

## 2022-08-14 DIAGNOSIS — Z853 Personal history of malignant neoplasm of breast: Secondary | ICD-10-CM

## 2022-08-15 ENCOUNTER — Other Ambulatory Visit: Payer: Self-pay | Admitting: Medical

## 2022-08-20 ENCOUNTER — Telehealth: Payer: Self-pay | Admitting: Medical

## 2022-08-20 NOTE — Telephone Encounter (Signed)
Pt called and advised ED visit if she doesn't feel better  [place] student

## 2022-08-20 NOTE — Telephone Encounter (Signed)
Pt called stating she is not feeling any better from her visit with Percell Miller on 9.12.23 and wanted to confirm that he advised her to go to the ED if she wasn't feeling better. Please Advise.

## 2022-08-22 ENCOUNTER — Ambulatory Visit (INDEPENDENT_AMBULATORY_CARE_PROVIDER_SITE_OTHER): Payer: HMO

## 2022-08-22 ENCOUNTER — Encounter (HOSPITAL_COMMUNITY): Payer: Self-pay | Admitting: Emergency Medicine

## 2022-08-22 ENCOUNTER — Ambulatory Visit (HOSPITAL_COMMUNITY)
Admission: EM | Admit: 2022-08-22 | Discharge: 2022-08-22 | Disposition: A | Discharge status: Home / Self Care | Payer: HMO | Attending: Physician Assistant | Admitting: Physician Assistant

## 2022-08-22 ENCOUNTER — Other Ambulatory Visit: Payer: Self-pay

## 2022-08-22 DIAGNOSIS — R1084 Generalized abdominal pain: Secondary | ICD-10-CM

## 2022-08-22 LAB — POCT URINALYSIS DIPSTICK, ED / UC
Bilirubin Urine: NEGATIVE
Glucose, UA: NEGATIVE mg/dL
Hgb urine dipstick: NEGATIVE
Ketones, ur: NEGATIVE mg/dL
Leukocytes,Ua: NEGATIVE
Nitrite: NEGATIVE
Protein, ur: NEGATIVE mg/dL
Specific Gravity, Urine: 1.015 (ref 1.005–1.030)
Urobilinogen, UA: 0.2 mg/dL (ref 0.0–1.0)
pH: 5 (ref 5.0–8.0)

## 2022-08-22 MED ORDER — HYOSCYAMINE SULFATE SL 0.125 MG SL SUBL: 0.1250 mg | SUBLINGUAL_TABLET | Freq: Two times a day (BID) | SUBLINGUAL | 0 refills | Status: DC | PRN

## 2022-08-22 MED ORDER — DICYCLOMINE HCL 10 MG PO CAPS: 10.0000 mg | ORAL_CAPSULE | Freq: Two times a day (BID) | ORAL | 0 refills | Status: DC | PRN

## 2022-08-22 NOTE — ED Notes (Signed)
Verified labeling at bedside, specimen placed in lab

## 2022-08-22 NOTE — ED Triage Notes (Signed)
Generalized abdominal pain.  Saw provider on 9/12.  Received prescription.  Patient called provider on Wednesday or Thursday and was told to go to ED or [facility].  Patient has had no vomiting or diarrhea.  Reports some nausea. Last bm was yesterday and normal for her per patient

## 2022-08-22 NOTE — Discharge Instructions (Addendum)
Your x-ray was normal. I am concerned about your severe pain and think the safest thing to do is go to the ER as we discussed. We will try a little medication but if your symptoms are not improving or worsening in any way you need to go directly to the ER. If you are feeling better with the medication please follow up with your PCP on Monday.

## 2022-08-22 NOTE — ED Provider Notes (Signed)
[place]    CSN: [id number] Arrival date & time: 08/22/22  1323      History   Chief Complaint Chief Complaint  Patient presents with   Abdominal Pain    HPI Donna Fuller is a 81 y.o. female.   Patient presents today with a 10-day history of abdominal pain.  Reports symptoms began on 08/11/2022 after she went to see her primary care and had the flu vaccine.  She has had the flu vaccine in the past but never had a reaction to it.  Since that time she has had ongoing abdominal pain.  She does have a history of GERD and her primary care prescribed Carafate but this has not provided any relief of symptoms.  She denies any suspicious food intake, recent travel, known sick contacts, medication changes outside of what was prescribed by her primary care.  She denies history of gastrointestinal disorder including diverticulitis or ulcerative colitis.  Denies previous abdominal surgery outside of partial hysterectomy; still has gallbladder and appendix.  Her last bowel movement was yesterday and was normal without blood or mucus.  She denies any urinary symptoms.  She has not been taking any over-the-counter medication for symptom management.    Past Medical History:  Diagnosis Date   Arthritis    Breast cancer ([place]) 2007   Right breast, s/p mastectomy   Diabetes mellitus without complication ([place])    GERD (gastroesophageal reflux disease)    Gout    Hyperlipidemia    Hypertension    Spinal stenosis     Patient Active Problem List   Diagnosis Date Noted   Type 2 diabetes mellitus with stage 3b chronic kidney disease, without long-term current use of insulin ([place]) 04/17/2022   Angina pectoris ([place]) 11/21/2021   Diabetes mellitus type 2 with complications ([place]) 22/29/7989   CAD (coronary artery disease) 11/21/2021   LV hypertrophy, hypertensive 11/21/2021   Elevated troponin 11/21/2021   Atypical chest pain 11/20/2021   DDD (degenerative disc disease), lumbar  11/03/2021   Rheumatoid factor positive 07/29/2021   History of total bilateral knee replacement 07/29/2021   Other idiopathic scoliosis, thoracic region 07/08/2021   Estrogen deficiency 07/08/2021   Arthralgia of both hands 07/08/2021   Moderate nonproliferative diabetic retinopathy of left eye ([place]) 02/11/2021   Pseudophakia 02/11/2021   Primary open angle glaucoma of both eyes, moderate stage 02/11/2021   Cystoid macular edema, right eye 02/11/2021   Type 2 diabetes mellitus with hyperglycemia, without long-term current use of insulin ([place]) 12/14/2019   Type 2 diabetes mellitus with diabetic polyneuropathy, without long-term current use of insulin ([place]) 12/14/2019   Pleuritic chest pain 05/30/2019   Fever 05/30/2019   Yeast vaginitis 07/19/2017   Urinary frequency 07/19/2017   CKD (chronic kidney disease) stage 3, GFR 30-59 ml/min ([facility]) 05/20/2017   Gout 05/20/2017   Hyperlipidemia 09/26/2016   Spinal stenosis 09/26/2016   History of breast cancer 09/26/2016   Diabetes mellitus type 2, controlled ([place]) 09/26/2016   GERD (gastroesophageal reflux disease) 09/26/2016   Facet arthropathy, lumbosacral 03/13/2015   Neuropathy 03/13/2015   Hypertension    Lumbar radiculopathy 01/08/2014    Past Surgical History:  Procedure Laterality Date   ABDOMINAL HYSTERECTOMY     BREAST BIOPSY Left    2016 benign    LEFT HEART CATH AND CORONARY ANGIOGRAPHY N/A 11/21/2021   Procedure: LEFT HEART CATH AND CORONARY ANGIOGRAPHY;  Surgeon: Wellington Hampshire, MD;  Location: [facility];  Service: Cardiovascular;  Laterality: N/A;   MASTECTOMY     right side   REPLACEMENT TOTAL KNEE Bilateral    TUBAL LIGATION      OB History     Gravida  3   Para  3   Term  3   Preterm      AB      Living  3      SAB      IAB      Ectopic      Multiple      Live Births  3            Home Medications    Prior to Admission medications   Medication Sig Start Date End Date  Taking? Authorizing Provider  dicyclomine (BENTYL) 10 MG capsule Take 1 capsule (10 mg total) by mouth 2 (two) times daily as needed for spasms. 08/22/22  Yes Taralee Marcus, Derry Skill, PA-C  acetaminophen (TYLENOL) 650 MG CR tablet Take 650 mg by mouth 2 (two) times daily.    [provider]  albuterol (VENTOLIN HFA) 108 (90 Base) MCG/ACT inhaler TAKE 2 PUFFS BY MOUTH EVERY 6 HOURS AS NEEDED 08/07/22   Saguier, Percell Miller, PA-C  ascorbic acid (VITAMIN C) 500 MG tablet Take 500 mg by mouth daily.    [provider]  aspirin 81 MG tablet Take 81 mg by mouth daily.    [provider]  Biotin 1000 MCG CHEW Chew by mouth.    [provider]  blood glucose meter kit and supplies KIT Dispense based on patient and insurance preference. Use up to four times daily as directed. (FOR ICD-9 250.00, 250.01). 07/09/17   Thersa Salt G, DO  brimonidine (ALPHAGAN) 0.2 % ophthalmic solution Place 1 drop into both eyes 2 (two) times a day. 05/08/19   [provider]  Cholecalciferol (VITAMIN D-3) 5000 units TABS Take 1 tablet by mouth daily.    [provider]  Dulaglutide (TRULICITY) 3 IW/9.7LG SOPN Inject 3 mg as directed once a week. 10/16/21   Shamleffer, Melanie Crazier, MD  DULoxetine (CYMBALTA) 30 MG capsule TAKE 1 CAPSULE BY MOUTH EVERY DAY 03/09/22   Saguier, Percell Miller, PA-C  famotidine (PEPCID) 20 MG tablet TAKE 1 TABLET BY MOUTH EVERY DAY 04/06/22   Saguier, Percell Miller, PA-C  fluticasone [facility]) 50 MCG/ACT nasal spray SPRAY 2 SPRAYS INTO EACH NOSTRIL EVERY DAY 08/17/22   Saguier, Percell Miller, PA-C  gabapentin (NEURONTIN) 300 MG capsule TAKE 1 CAPSULE BY MOUTH THREE TIMES A DAY 05/22/22   Saguier, Percell Miller, PA-C  glipiZIDE (GLUCOTROL) 5 MG tablet Take 1 tablet (5 mg total) by mouth daily before breakfast AND 0.5 tablets (2.5 mg total) daily before supper. 04/17/22   Shamleffer, Melanie Crazier, MD  ketorolac (ACULAR) 0.5 % ophthalmic solution INSTILL 1 DROP INTO RIGHT EYE 4 TIMES A DAY 04/07/22    Rankin, Clent Demark, MD  latanoprost (XALATAN) 0.005 % ophthalmic solution Place 1 drop into both eyes daily. 05/05/19   [provider]  levocetirizine (XYZAL) 5 MG tablet Take 1 tablet (5 mg total) by mouth every evening. 07/14/22   Saguier, Percell Miller, PA-C  losartan (COZAAR) 50 MG tablet Take 50 mg by mouth daily. 11/08/19   [provider]  metoprolol succinate (TOPROL-XL) 25 MG 24 hr tablet Take 25 mg by mouth daily. 05/06/20   [provider]  montelukast (SINGULAIR) 10 MG tablet Take 10 mg by mouth at bedtime.    [provider]  omeprazole (PRILOSEC) 40 MG capsule Take 1 capsule (  40 mg total) by mouth 2 (two) times daily. Must keep appt on 12/06/16 with Dr. Caryl Bis 05/29/22   Levin Erp, PA  OneTouch Delica Lancets 93T MISC Use as instructed to check blood sugar 2-3 times a day.  DX E11.9 10/21/21   Shamleffer, Melanie Crazier, MD  ONETOUCH ULTRA test strip CHECK BLOOD SUGAR 2 TO 3 TIMES DAILY AS DIRECTED 04/06/22   Saguier, Percell Miller, PA-C  pravastatin (PRAVACHOL) 20 MG tablet TAKE 1 TABLET BY MOUTH ONCE DAILY Patient taking differently: Take 20 mg by mouth daily. 09/27/17   Leone Haven, MD  sucralfate (CARAFATE) 1 g tablet Take 1 tablet (1 g total) by mouth 4 (four) times daily -  with meals and at bedtime. 08/11/22   Saguier, Percell Miller, PA-C  timolol (TIMOPTIC) 0.5 % ophthalmic solution Place 1 drop into both eyes 2 (two) times a day. 03/15/19   [provider]  torsemide (DEMADEX) 5 MG tablet Take 5 mg by mouth daily. 07/23/22   [provider]    Family History Family History  Problem Relation Age of Onset   Diabetes Mother    Alzheimer's disease Mother    Hypertension Mother    Hypertension Father    Heart disease Father    Hyperlipidemia Father    Kidney disease Father    Diabetes Sister    Cancer Brother        lung   Alzheimer's disease Brother    Heart disease Brother    Hypertension Brother    Diabetes Brother     Hypertension Brother    Diabetes Brother    Hyperlipidemia Son    Hypertension Son    Diabetes Son    Hypertension Daughter    Hyperlipidemia Daughter     Social History Social History   Tobacco Use   Smoking status: Former    Years: 5.00    Types: Cigarettes    Passive exposure: Never   Smokeless tobacco: Never  Vaping Use   Vaping Use: Never used  Substance Use Topics   Alcohol use: No   Drug use: No     Allergies   Penicillins, Sulfa antibiotics, Amlodipine, and Lisinopril   Review of Systems Review of Systems  Constitutional:  Positive for appetite change. Negative for activity change, fatigue and fever.  Respiratory:  Negative for cough and shortness of breath.   Cardiovascular:  Negative for chest pain.  Gastrointestinal:  Positive for abdominal pain. Negative for blood in stool, constipation, diarrhea, nausea and vomiting.  Genitourinary:  Negative for dysuria, frequency and urgency.  Neurological:  Negative for dizziness, light-headedness and headaches.     Physical Exam Triage Vital Signs ED Triage Vitals  Enc Vitals Group     BP 08/22/22 1414 114/72     Pulse Rate 08/22/22 1414 78     Resp 08/22/22 1414 18     Temp 08/22/22 1414 98.2 F (36.8 C)     Temp Source 08/22/22 1414 Oral     SpO2 08/22/22 1414 97 %     Weight --      Height --      Head Circumference --      Peak Flow --      Pain Score 08/22/22 1411 10     Pain Loc --      Pain Edu? --      Excl. in [place]? --    No data found.  Updated Vital Signs BP 114/72 (BP Location: Left Arm) Comment (BP Location):  large cuff  Pulse 78   Temp 98.2 F (36.8 C) (Oral)   Resp 18   SpO2 97%   Visual Acuity Right Eye Distance:   Left Eye Distance:   Bilateral Distance:    Right Eye Near:   Left Eye Near:    Bilateral Near:     Physical Exam Vitals reviewed.  Constitutional:      General: She is awake. She is not in acute distress.    Appearance: Normal appearance. She is  well-developed. She is not ill-appearing.     Comments: Very pleasant female appears stated age no acute distress sitting comfortably in exam room  HENT:     Head: Normocephalic and atraumatic.     Mouth/Throat:     Pharynx: Uvula midline. No oropharyngeal exudate or posterior oropharyngeal erythema.  Cardiovascular:     Rate and Rhythm: Normal rate and regular rhythm.     Heart sounds: Normal heart sounds, S1 normal and S2 normal. No murmur heard. Pulmonary:     Effort: Pulmonary effort is normal.     Breath sounds: Normal breath sounds. No wheezing, rhonchi or rales.     Comments: Clear to auscultation bilaterally Abdominal:     General: Bowel sounds are normal.     Palpations: Abdomen is soft.     Tenderness: There is generalized abdominal tenderness and tenderness in the right lower quadrant and epigastric area. There is no right CVA tenderness, left CVA tenderness, guarding or rebound. Negative signs include Murphy's sign, Rovsing's sign and McBurney's sign.     Comments: Generalized tenderness palpation throughout abdomen but worse in epigastrium and right lower quadrant.  No evidence of acute abdomen on physical exam.  Psychiatric:        Behavior: Behavior is cooperative.      UC Treatments / Results  Labs (all labs ordered are listed, but only abnormal results are displayed) Labs Reviewed  POCT URINALYSIS DIPSTICK, ED / UC    EKG   Radiology DG Abdomen 1 View  Result Date: 08/22/2022 CLINICAL DATA:  Generalized abdominal pain EXAM: ABDOMEN - 1 VIEW COMPARISON:  Abdominal x-ray 04/24/2020 FINDINGS: The bowel gas pattern is normal. No suspicious radio-opaque calculi. Phleboliths are again seen in the left hemipelvis. Degenerative changes affect the lumbar spine. IMPRESSION: Nonobstructive, nonspecific bowel gas pattern. Electronically Signed   By: Ronney Asters M.D.   On: 08/22/2022 15:09    Procedures Procedures (including critical care time)  Medications Ordered in  UC Medications - No data to display  Initial Impression / Assessment and Plan / UC Course  I have reviewed the triage vital signs and the nursing notes.  Pertinent labs & imaging results that were available during my care of the patient were reviewed by me and considered in my medical decision making (see chart for details).     Patient is well-appearing, afebrile, nontoxic, nontachycardic.  We did initially discuss that with 10 out of 10 abdominal pain the safest thing to do would be to go to the emergency room since we have limited imaging capabilities in urgent care.  Patient declined this and preferred [place] to start work-up.  Urine was normal.  KUB was normal.  CBC, CMP, lipase were ordered but when nursing staff went into obtaining labs patient ultimately declined this.  We will try low-dose dicyclomine to help manage symptoms.  She was encouraged to eat a bland diet and drink plenty of fluids.  We discussed at length that if she has persistent/severe abdominal  pain or if at any point anything worsens she must go to the emergency room immediately to which she expressed understanding.  She is to follow-up with her primary care first thing next week if she does not require emergent evaluation over the weekend.  Final Clinical Impressions(s) / UC Diagnoses   Final diagnoses:  Generalized abdominal pain     Discharge Instructions      Your x-ray was normal. I am concerned about your severe pain and think the safest thing to do is go to the ER as we discussed. We will try a little medication but if your symptoms are not improving or worsening in any way you need to go directly to the ER. If you are feeling better with the medication please follow up with your PCP on Monday.      ED Prescriptions     Medication Sig Dispense Auth. Provider   Hyoscyamine Sulfate SL (LEVSIN/SL) 0.125 MG SUBL  (Status: Discontinued) Place 0.125 mg under the tongue 2 (two) times daily as needed. 20 tablet  Adelynn Gipe K, PA-C   dicyclomine (BENTYL) 10 MG capsule Take 1 capsule (10 mg total) by mouth 2 (two) times daily as needed for spasms. 14 capsule Sudeep Scheibel K, PA-C      PDMP not reviewed this encounter.   Terrilee Croak, PA-C 08/22/22 1601

## 2022-08-28 ENCOUNTER — Other Ambulatory Visit: Payer: Self-pay | Admitting: Medical

## 2022-08-31 ENCOUNTER — Other Ambulatory Visit: Payer: Self-pay | Admitting: Medical

## 2022-09-03 ENCOUNTER — Other Ambulatory Visit: Payer: Self-pay | Admitting: Physician Assistant

## 2022-09-07 ENCOUNTER — Other Ambulatory Visit: Payer: Self-pay | Admitting: Medical

## 2022-09-24 ENCOUNTER — Emergency Department (HOSPITAL_BASED_OUTPATIENT_CLINIC_OR_DEPARTMENT_OTHER): Payer: HMO

## 2022-09-24 ENCOUNTER — Other Ambulatory Visit: Payer: Self-pay

## 2022-09-24 ENCOUNTER — Encounter (HOSPITAL_BASED_OUTPATIENT_CLINIC_OR_DEPARTMENT_OTHER): Payer: Self-pay

## 2022-09-24 ENCOUNTER — Emergency Department (HOSPITAL_BASED_OUTPATIENT_CLINIC_OR_DEPARTMENT_OTHER)
Admission: EM | Admit: 2022-09-24 | Discharge: 2022-09-24 | Disposition: A | Discharge status: Home / Self Care | Payer: HMO | Attending: Emergency Medicine | Admitting: Emergency Medicine

## 2022-09-24 DIAGNOSIS — I10 Essential (primary) hypertension: Secondary | ICD-10-CM | POA: Diagnosis not present

## 2022-09-24 DIAGNOSIS — R1032 Left lower quadrant pain: Secondary | ICD-10-CM | POA: Diagnosis present

## 2022-09-24 DIAGNOSIS — E119 Type 2 diabetes mellitus without complications: Secondary | ICD-10-CM | POA: Diagnosis not present

## 2022-09-24 DIAGNOSIS — M791 Myalgia, unspecified site: Secondary | ICD-10-CM | POA: Insufficient documentation

## 2022-09-24 DIAGNOSIS — Z7982 Long term (current) use of aspirin: Secondary | ICD-10-CM | POA: Diagnosis not present

## 2022-09-24 DIAGNOSIS — Z79899 Other long term (current) drug therapy: Secondary | ICD-10-CM | POA: Insufficient documentation

## 2022-09-24 DIAGNOSIS — R1030 Lower abdominal pain, unspecified: Secondary | ICD-10-CM

## 2022-09-24 LAB — CBC WITH DIFFERENTIAL/PLATELET
Abs Immature Granulocytes: 0.03 10*3/uL (ref 0.00–0.07)
Basophils Absolute: 0 10*3/uL (ref 0.0–0.1)
Basophils Relative: 0 %
Eosinophils Absolute: 0.1 10*3/uL (ref 0.0–0.5)
Eosinophils Relative: 2 %
HCT: 40.7 % (ref 36.0–46.0)
Hemoglobin: 13.2 g/dL (ref 12.0–15.0)
Immature Granulocytes: 0 %
Lymphocytes Relative: 32 %
Lymphs Abs: 2.2 10*3/uL (ref 0.7–4.0)
MCH: 29.6 pg (ref 26.0–34.0)
MCHC: 32.4 g/dL (ref 30.0–36.0)
MCV: 91.3 fL (ref 80.0–100.0)
Monocytes Absolute: 0.6 10*3/uL (ref 0.1–1.0)
Monocytes Relative: 9 %
Neutro Abs: 3.9 10*3/uL (ref 1.7–7.7)
Neutrophils Relative %: 57 %
Platelets: 182 10*3/uL (ref 150–400)
RBC: 4.46 MIL/uL (ref 3.87–5.11)
RDW: 14.4 % (ref 11.5–15.5)
WBC: 6.9 10*3/uL (ref 4.0–10.5)
nRBC: 0 % (ref 0.0–0.2)

## 2022-09-24 LAB — COMPREHENSIVE METABOLIC PANEL
ALT: 13 U/L (ref 0–44)
AST: 17 U/L (ref 15–41)
Albumin: 4.2 g/dL (ref 3.5–5.0)
Alkaline Phosphatase: 117 U/L (ref 38–126)
Anion gap: 8 (ref 5–15)
BUN: 28 mg/dL — ABNORMAL HIGH (ref 8–23)
CO2: 30 mmol/L (ref 22–32)
Calcium: 10 mg/dL (ref 8.9–10.3)
Chloride: 102 mmol/L (ref 98–111)
Creatinine, Ser: 1.43 mg/dL — ABNORMAL HIGH (ref 0.44–1.00)
GFR, Estimated: 37 mL/min — ABNORMAL LOW (ref >60)
Glucose, Bld: 124 mg/dL — ABNORMAL HIGH (ref 70–99)
Potassium: 4.3 mmol/L (ref 3.5–5.1)
Sodium: 140 mmol/L (ref 135–145)
Total Bilirubin: 0.5 mg/dL (ref 0.3–1.2)
Total Protein: 7.2 g/dL (ref 6.5–8.1)

## 2022-09-24 LAB — URINALYSIS, ROUTINE W REFLEX MICROSCOPIC
Bilirubin Urine: NEGATIVE
Glucose, UA: NEGATIVE mg/dL
Hgb urine dipstick: NEGATIVE
Ketones, ur: NEGATIVE mg/dL
Leukocytes,Ua: NEGATIVE
Nitrite: NEGATIVE
Protein, ur: NEGATIVE mg/dL
Specific Gravity, Urine: <=1.005 (ref 1.005–1.030)
pH: 5 (ref 5.0–8.0)

## 2022-09-24 MED ORDER — IOHEXOL 300 MG/ML  SOLN
100.0000 mL | Freq: Once | INTRAMUSCULAR | Status: AC | PRN
  Administered 2022-09-24: 80 mL via INTRAVENOUS

## 2022-09-24 NOTE — ED Triage Notes (Signed)
Pt states that she got the flu shot on 9/12 and since then she has had body aches, weakness and that she has lost 17 lbs. No appetite.

## 2022-09-24 NOTE — Discharge Instructions (Signed)
All your blood work today looks normal.  Your CAT scan showed everything in your stomach looks okay but it did show some nodules in your lungs that need to have a follow-up in 12 months.

## 2022-09-24 NOTE — ED Notes (Signed)
Pt states that is OK to use right arm even though she had a Mastectomy on that side. States that is where they always stick her for labs etc.

## 2022-09-24 NOTE — ED Provider Notes (Signed)
[facility] EMER[facility]ENCY DEPARTMENT Provider Note   CSN: [id number] Arrival date & time: 09/24/22  0848     History  Chief Complaint  Patient presents with   Abdominal Pain    Donna Fuller is a 81 y.o. female.  Patient is an 81 year old female with a history of diabetes, spinal stenosis, hypertension, arthritis, GERD who is presenting today with multiple vague complaints.  Patient reports since 12 September after receiving a flu shot she is just not been feeling herself.  She reports she was in the bed for 2 weeks not able to do anything but since that time has still felt unwell.  She is complaining of achy pain all over her body but most specifically she has had a very poor appetite and initially reports she cannot eat at all because it was causing worsening abdominal pain.  She denied any vomiting, constipation or diarrhea.  She reports she is lost 17 pounds in the last month and a half because she just did not feel like eating.  She has stopped taking multiple medications including Cymbalta, dicyclomine and a few others that she cannot remember.  She reports she is still compliant with her blood pressure medication, diuretics and diabetes medication.  She has been drinking Ensure and feels like overall things are starting to get better but she followed up with her doctor and they told her to come here for evaluation.  She denies any new cough, fever, chest pain or shortness of breath.  The history is provided by the patient.  Abdominal Pain      Home Medications Prior to Admission medications   Medication Sig Start Date End Date Taking? Authorizing Provider  acetaminophen (TYLENOL) 650 MG CR tablet Take 650 mg by mouth 2 (two) times daily.    [provider]  albuterol (VENTOLIN HFA) 108 (90 Base) MCG/ACT inhaler TAKE 2 PUFFS BY MOUTH EVERY 6 HOURS AS NEEDED 08/07/22   Saguier, Percell Miller, PA-C  ascorbic acid (VITAMIN C) 500 MG tablet Take 500 mg by mouth daily.     [provider]  aspirin 81 MG tablet Take 81 mg by mouth daily.    [provider]  Biotin 1000 MCG CHEW Chew by mouth.    [provider]  blood glucose meter kit and supplies KIT Dispense based on patient and insurance preference. Use up to four times daily as directed. (FOR ICD-9 250.00, 250.01). 07/09/17   Thersa Salt G, DO  brimonidine (ALPHAGAN) 0.2 % ophthalmic solution Place 1 drop into both eyes 2 (two) times a day. 05/08/19   [provider]  Cholecalciferol (VITAMIN D-3) 5000 units TABS Take 1 tablet by mouth daily.    [provider]  dicyclomine (BENTYL) 10 MG capsule Take 1 capsule (10 mg total) by mouth 2 (two) times daily as needed for spasms. 08/22/22   Raspet, Erin K, PA-C  Dulaglutide (TRULICITY) 3 WE/9.9BZ SOPN Inject 3 mg as directed once a week. 10/16/21   Shamleffer, Melanie Crazier, MD  DULoxetine (CYMBALTA) 30 MG capsule TAKE 1 CAPSULE BY MOUTH EVERY DAY 03/09/22   Saguier, Percell Miller, PA-C  famotidine (PEPCID) 20 MG tablet TAKE 1 TABLET BY MOUTH EVERY DAY 04/06/22   Saguier, Percell Miller, PA-C  fluticasone [facility]) 50 MCG/ACT nasal spray SPRAY 2 SPRAYS INTO EACH NOSTRIL EVERY DAY 08/31/22   Saguier, Percell Miller, PA-C  gabapentin (NEURONTIN) 300 MG capsule TAKE 1 CAPSULE BY MOUTH THREE TIMES A DAY 05/22/22   Saguier, Percell Miller, PA-C  glipiZIDE (GLUCOTROL) 5  MG tablet Take 1 tablet (5 mg total) by mouth daily before breakfast AND 0.5 tablets (2.5 mg total) daily before supper. 04/17/22   Shamleffer, Melanie Crazier, MD  ketorolac (ACULAR) 0.5 % ophthalmic solution INSTILL 1 DROP INTO RIGHT EYE 4 TIMES A DAY 04/07/22   Rankin, Clent Demark, MD  latanoprost (XALATAN) 0.005 % ophthalmic solution Place 1 drop into both eyes daily. 05/05/19   [provider]  levocetirizine (XYZAL) 5 MG tablet Take 1 tablet (5 mg total) by mouth every evening. 07/14/22   Saguier, Percell Miller, PA-C  losartan (COZAAR) 50 MG tablet Take 50 mg by mouth daily. 11/08/19   [provider]  metoprolol succinate (TOPROL-XL) 25 MG 24 hr tablet Take 25 mg by mouth daily. 05/06/20   [provider]  montelukast (SINGULAIR) 10 MG tablet Take 10 mg by mouth at bedtime.    [provider]  omeprazole (PRILOSEC) 40 MG capsule TAKE 1 CAPSULE (40 MG TOTAL) BY MOUTH 2 (TWO) TIMES DAILY 09/03/22   Levin Erp, PA  OneTouch Delica Lancets 96P MISC Use as instructed to check blood sugar 2-3 times a day.  DX E11.9 10/21/21   Shamleffer, Melanie Crazier, MD  ONETOUCH ULTRA test strip CHECK BLOOD SUGAR 2 TO 3 TIMES DAILY AS DIRECTED 04/06/22   Saguier, Percell Miller, PA-C  pravastatin (PRAVACHOL) 20 MG tablet TAKE 1 TABLET BY MOUTH ONCE DAILY Patient taking differently: Take 20 mg by mouth daily. 09/27/17   Leone Haven, MD  sucralfate (CARAFATE) 1 g tablet TAKE 1 TABLET (1 G TOTAL) BY MOUTH 4 TIMES A DAY WITH MEALS AND AT BEDTIME 08/28/22   Saguier, Percell Miller, PA-C  timolol (TIMOPTIC) 0.5 % ophthalmic solution Place 1 drop into both eyes 2 (two) times a day. 03/15/19   [provider]  torsemide (DEMADEX) 5 MG tablet Take 5 mg by mouth daily. 07/23/22   [provider]      Allergies    Penicillins, Sulfa antibiotics, Amlodipine, and Lisinopril    Review of Systems   Review of Systems  Gastrointestinal:  Positive for abdominal pain.    Physical Exam Updated Vital Signs BP (!) 130/59   Pulse 68   Temp 98 F (36.7 C)   Resp 15   Ht '5\' 1"'  (1.549 m)   Wt 83.9 kg   SpO2 96%   BMI 34.96 kg/m  Physical Exam Vitals and nursing note reviewed.  Constitutional:      General: She is not in acute distress.    Appearance: She is well-developed.  HENT:     Head: Normocephalic and atraumatic.  Eyes:     Pupils: Pupils are equal, round, and reactive to light.  Cardiovascular:     Rate and Rhythm: Normal rate and regular rhythm.     Heart sounds: Normal heart sounds. No murmur heard.    No friction rub.  Pulmonary:     Effort: Pulmonary effort is  normal.     Breath sounds: Normal breath sounds. No wheezing or rales.  Abdominal:     General: Abdomen is flat. Bowel sounds are normal. There is no distension.     Palpations: Abdomen is soft.     Tenderness: There is abdominal tenderness in the left lower quadrant. There is no right CVA tenderness, left CVA tenderness, guarding or rebound.  Musculoskeletal:        General: No tenderness. Normal range of motion.     Right lower leg: No edema.     Left  lower leg: No edema.     Comments: No edema  Skin:    General: Skin is warm and dry.     Findings: No rash.  Neurological:     Mental Status: She is alert and oriented to person, place, and time. Mental status is at baseline.     Cranial Nerves: No cranial nerve deficit.  Psychiatric:        Mood and Affect: Mood normal.        Behavior: Behavior normal.     ED Results / Procedures / Treatments   Labs (all labs ordered are listed, but only abnormal results are displayed) Labs Reviewed  COMPREHENSIVE METABOLIC PANEL - Abnormal; Notable for the following components:      Result Value   Glucose, Bld 124 (*)    BUN 28 (*)    Creatinine, Ser 1.43 (*)    GFR, Estimated 37 (*)    All other components within normal limits  CBC WITH DIFFERENTIAL/PLATELET  URINALYSIS, ROUTINE W REFLEX MICROSCOPIC    EKG None  Radiology CT ABDOMEN PELVIS W CONTRAST  Result Date: 09/24/2022 CLINICAL DATA:  Left lower quadrant abdominal pain EXAM: CT ABDOMEN AND PELVIS WITH CONTRAST TECHNIQUE: Multidetector CT imaging of the abdomen and pelvis was performed using the standard protocol following bolus administration of intravenous contrast. RADIATION DOSE REDUCTION: This exam was performed according to the departmental dose-optimization program which includes automated exposure control, adjustment of the mA and/or kV according to patient size and/or use of iterative reconstruction technique. CONTRAST:  50m OMNIPAQUE IOHEXOL 300 MG/ML  SOLN COMPARISON:   None Available. FINDINGS: Lower chest: Cardiomegaly. Scattered solid pulmonary nodules, largest located in the right middle lobe and measures 5 mm on series 4, image 4. Coronary artery calcifications. Hepatobiliary: No focal liver abnormality is seen. No gallstones, gallbladder wall thickening, or biliary dilatation. Pancreas: Unremarkable. No pancreatic ductal dilatation or surrounding inflammatory changes. Spleen: Normal in size without focal abnormality. Adrenals/Urinary Tract: Bilateral adrenal glands are unremarkable. No hydronephrosis or nephrolithiasis. Scattered low-attenuation renal lesions which are too small to completely characterize but likely simple cysts, no specific follow-up imaging is recommended. Bladder is unremarkable. Stomach/Bowel: Stomach is within normal limits. Appendix is not visualized, although there are no secondary findings of acute appendicitis. Mild diverticulosis no evidence of bowel wall thickening, distention, or inflammatory changes. Vascular/Lymphatic: Aortic atherosclerosis. No enlarged abdominal or pelvic lymph nodes. Reproductive: Status post hysterectomy. No adnexal masses. Other: No abdominal wall hernia or abnormality. No abdominopelvic ascites. Musculoskeletal: Prior right mastectomy. No acute or significant osseous findings. IMPRESSION: 1. No acute findings in the abdomen or pelvis. 2. Scattered solid pulmonary nodules, largest located in the right middle lobe and measures 5 mm. No follow-up needed if patient is low-risk (and has no known or suspected primary neoplasm). Non-contrast chest CT can be considered in 12 months if patient is high-risk. This recommendation follows the consensus statement: Guidelines for Management of Incidental Pulmonary Nodules Detected on CT Images: From the Fleischner Society 2017; Radiology 2017; 284:228-243. 3. Aortic Atherosclerosis (ICD10-I70.0). Electronically Signed   By: LYetta GlassmanM.D.   On: 09/24/2022 11:58     Procedures Procedures    Medications Ordered in ED Medications  iohexol (OMNIPAQUE) 300 MG/ML solution 100 mL (80 mLs Intravenous Contrast Given 09/24/22 1117)    ED Course/ Medical Decision Making/ A&P                           Medical Decision  Making Amount and/or Complexity of Data Reviewed External Data Reviewed: notes.    Details: Pcp notes Labs: ordered. Decision-making details documented in ED Course. Radiology: ordered and independent interpretation performed. Decision-making details documented in ED Course.  Risk Prescription drug management.   Pt with multiple medical problems and comorbidities and presenting today with a complaint that caries a high risk for morbidity and mortality.  Here today with a 17 pound weight loss, abdominal pain and poor appetite.  Patient symptoms have now been present for approximately a month and a half.  She did have issues with GERD and abdominal pain in the past but today is having lower abdominal pain.  She is status post hysterectomy but denies any alcohol use, reasons for pancreatitis.  Concern for possible diverticulitis, cancer.  Concern for possible electrolyte abnormalities.  Patient denies any urinary symptoms at this time and low suspicion for pyelonephritis or renal stone.  Low suspicion for heart or lung issues today.  Patient does not appear dehydrated at this time.  Labs pending as well as CT for further evaluation.  12:24 PM I independently interpreted patient's labs today and CBC, CMP, UA are all within normal limits.  I have independently visualized and interpreted pt's images today.  CT without evidence of renal stones or obstruction.  Radiology reports negative CT of the abdomen and pelvis but she does have multiple pulmonary nodules.  Patient last smoked 50 years ago but does have a history of prior breast cancer.  Discussed the findings with her and instructed that she would need to have a repeat CT in approximately 12  months for follow-up.  Also discussed with her continuing the current medications that she is on and continuing the Ensure to get calories to make sure she can lose any further weight.  No indication for further testing or admission today.  No social determinants affecting the patient's care and discharged today.  However did encourage patient to follow-up with PCP.          Final Clinical Impression(s) / ED Diagnoses Final diagnoses:  Myalgia  Lower abdominal pain    Rx / DC Orders ED Discharge Orders     None         Blanchie Dessert, MD 09/24/22 1226

## 2022-09-29 ENCOUNTER — Other Ambulatory Visit: Payer: Self-pay | Admitting: Medical

## 2022-09-29 ENCOUNTER — Telehealth: Payer: Self-pay

## 2022-09-29 NOTE — Telephone Encounter (Signed)
Patient will complete new Donna Fuller application when she comes in for her appointment on 10/19/22.

## 2022-10-05 ENCOUNTER — Ambulatory Visit: Payer: HMO

## 2022-10-06 ENCOUNTER — Ambulatory Visit (INDEPENDENT_AMBULATORY_CARE_PROVIDER_SITE_OTHER): Payer: HMO | Admitting: *Deleted

## 2022-10-06 DIAGNOSIS — Z0000 Encounter for general adult medical examination without abnormal findings: Secondary | ICD-10-CM | POA: Diagnosis not present

## 2022-10-06 NOTE — Progress Notes (Addendum)
Subjective:   Donna Fuller is a 81 y.o. female who presents for Medicare Annual (Subsequent) preventive examination.  I connected with  Lenice Llamas on 10/06/22 by a audio enabled telemedicine application and verified that I am speaking with the correct person using two identifiers.  Patient Location: Home  Provider Location: Office/Clinic  I discussed the limitations of evaluation and management by telemedicine. The patient expressed understanding and agreed to proceed.   Review of Systems    Defer to PCP Cardiac Risk Factors include: advanced age (>84mn, >>4women);dyslipidemia;hypertension;diabetes mellitus     Objective:    Today's Vitals   10/06/22 1308  PainSc: 9    There is no height or weight on file to calculate BMI.     10/06/2022    1:19 PM 09/24/2022    9:21 AM 09/24/2022    9:12 AM 11/20/2021    7:38 AM 10/01/2021    2:26 PM 03/28/2020    1:45 PM 05/31/2019    3:58 PM  Advanced Directives  Does Patient Have a Medical Advance Directive? Yes No;Yes No Yes Yes Yes   Type of Advance Directive Living will;Healthcare Power of [place] will   Does patient want to make changes to medical advance directive? No - Patient declined   No - Patient declined  No - Patient declined   Copy of [place] Chart? No - copy requested   No - copy requested No - copy requested No - copy requested   Would patient like information on creating a medical advance directive?    No - Patient declined   No - Patient declined    Current Medications (verified) Outpatient Encounter Medications as of 10/06/2022  Medication Sig   acetaminophen (TYLENOL) 650 MG CR tablet Take 650 mg by mouth 2 (two) times daily.   albuterol (VENTOLIN HFA) 108 (90 Base) MCG/ACT inhaler TAKE 2 PUFFS BY MOUTH EVERY 6 HOURS AS NEEDED    ascorbic acid (VITAMIN C) 500 MG tablet Take 500 mg by mouth daily.   aspirin 81 MG tablet Take 81 mg by mouth daily.   Biotin 1000 MCG CHEW Chew by mouth.   blood glucose meter kit and supplies KIT Dispense based on patient and insurance preference. Use up to four times daily as directed. (FOR ICD-9 250.00, 250.01).   brimonidine (ALPHAGAN) 0.2 % ophthalmic solution Place 1 drop into both eyes 2 (two) times a day.   Cholecalciferol (VITAMIN D-3) 5000 units TABS Take 1 tablet by mouth daily.   dicyclomine (BENTYL) 10 MG capsule Take 1 capsule (10 mg total) by mouth 2 (two) times daily as needed for spasms.   Dulaglutide (TRULICITY) 3 MDS/2.8JGSOPN Inject 3 mg as directed once a week.   DULoxetine (CYMBALTA) 30 MG capsule TAKE 1 CAPSULE BY MOUTH EVERY DAY   famotidine (PEPCID) 20 MG tablet TAKE 1 TABLET BY MOUTH EVERY DAY   fluticasone (FLONASE) 50 MCG/ACT nasal spray SPRAY 2 SPRAYS INTO EACH NOSTRIL EVERY DAY   gabapentin (NEURONTIN) 300 MG capsule TAKE 1 CAPSULE BY MOUTH THREE TIMES A DAY   glipiZIDE (GLUCOTROL) 5 MG tablet Take 1 tablet (5 mg total) by mouth daily before breakfast AND 0.5 tablets (2.5 mg total) daily before supper.   ketorolac (ACULAR) 0.5 % ophthalmic solution INSTILL 1 DROP INTO RIGHT EYE 4 TIMES A DAY   latanoprost (XALATAN) 0.005 %  ophthalmic solution Place 1 drop into both eyes daily.   levocetirizine (XYZAL) 5 MG tablet Take 1 tablet (5 mg total) by mouth every evening.   losartan (COZAAR) 50 MG tablet Take 50 mg by mouth daily.   metoprolol succinate (TOPROL-XL) 25 MG 24 hr tablet Take 25 mg by mouth daily.   montelukast (SINGULAIR) 10 MG tablet Take 10 mg by mouth at bedtime.   omeprazole (PRILOSEC) 40 MG capsule TAKE 1 CAPSULE (40 MG TOTAL) BY MOUTH 2 (TWO) TIMES DAILY   OneTouch Delica Lancets 91T MISC Use as instructed to check blood sugar 2-3 times a day.  DX E11.9   ONETOUCH ULTRA test strip CHECK BLOOD SUGAR 2 TO 3 TIMES DAILY AS DIRECTED   pravastatin  (PRAVACHOL) 20 MG tablet TAKE 1 TABLET BY MOUTH ONCE DAILY (Patient taking differently: Take 20 mg by mouth daily.)   sucralfate (CARAFATE) 1 g tablet TAKE 1 TABLET (1 G TOTAL) BY MOUTH 4 TIMES A DAY WITH MEALS AND AT BEDTIME   timolol (TIMOPTIC) 0.5 % ophthalmic solution Place 1 drop into both eyes 2 (two) times a day.   torsemide (DEMADEX) 5 MG tablet Take 5 mg by mouth daily.   No facility-administered encounter medications on file as of 10/06/2022.    Allergies (verified) Penicillins, Sulfa antibiotics, Amlodipine, and Lisinopril   History: Past Medical History:  Diagnosis Date   Arthritis    Breast cancer ([place]) 2007   Right breast, s/p mastectomy   Diabetes mellitus without complication (HCC)    GERD (gastroesophageal reflux disease)    Gout    Hyperlipidemia    Hypertension    Spinal stenosis    Past Surgical History:  Procedure Laterality Date   ABDOMINAL HYSTERECTOMY     ABDOMINAL SURGERY     BREAST BIOPSY Left    2016 benign    LEFT HEART CATH AND CORONARY ANGIOGRAPHY N/A 11/21/2021   Procedure: LEFT HEART CATH AND CORONARY ANGIOGRAPHY;  Surgeon: Wellington Hampshire, MD;  Location: [facility];  Service: Cardiovascular;  Laterality: N/A;   MASTECTOMY     right side   REPLACEMENT TOTAL KNEE Bilateral    TUBAL LIGATION     Family History  Problem Relation Age of Onset   Diabetes Mother    Alzheimer's disease Mother    Hypertension Mother    Hypertension Father    Heart disease Father    Hyperlipidemia Father    Kidney disease Father    Diabetes Sister    Cancer Brother        lung   Alzheimer's disease Brother    Heart disease Brother    Hypertension Brother    Diabetes Brother    Hypertension Brother    Diabetes Brother    Hyperlipidemia Son    Hypertension Son    Diabetes Son    Hypertension Daughter    Hyperlipidemia Daughter    Social History   Socioeconomic History   Marital status: Married    Spouse name: Jimmy   Number of children:  3   Years of education: 12   Highest education level: Not on file  Occupational History   Not on file  Tobacco Use   Smoking status: Former    Years: 5.00    Types: Cigarettes    Passive exposure: Never   Smokeless tobacco: Never  Vaping Use   Vaping Use: Never used  Substance and Sexual Activity   Alcohol use: No   Drug use: No  Sexual activity: Not Currently  Other Topics Concern   Not on file  Social History Narrative   Marital Status:  Married Manufacturing systems engineer)    Children:  Daughter(1) Son (1)    Pets:  None    Living Situation: Lives with husband and daughter.     Occupation:  Retired Licensed conveyancer)    Education: 12th Grade    Tobacco Use/Exposure:  She used to smoke socially during the weekends but quit 40 years ago.    Alcohol Use:  Occasional   Drug Use:  None   Diet:  Regular   Exercise:  None   Hobbies:  Reading and playing volleyball             Social Determinants of Health   Financial Resource Strain: Low Risk  (10/01/2021)   Overall Financial Resource Strain (CARDIA)    Difficulty of Paying Living Expenses: Not hard at all  Food Insecurity: No Food Insecurity (10/01/2021)   Hunger Vital Sign    Worried About Running Out of Food in the Last Year: Never true    Ran Out of Food in the Last Year: Never true  Transportation Needs: No Transportation Needs (10/01/2021)   PRAPARE - Hydrologist (Medical): No    Lack of Transportation (Non-Medical): No  Physical Activity: Inactive (10/01/2021)   Exercise Vital Sign    Days of Exercise per Week: 0 days    Minutes of Exercise per Session: 0 min  Stress: No Stress Concern Present (10/01/2021)   [place]    Feeling of Stress : Not at all  Social Connections: Moderately Integrated (10/01/2021)   Social Connection and Isolation Panel [NHANES]    Frequency of Communication with Friends and Family: More than three times a week    Frequency  of Social Gatherings with Friends and Family: More than three times a week    Attends Religious Services: More than 4 times per year    Active Member of [employer] or Organizations: No    Attends Music therapist: Never    Marital Status: Married    Tobacco Counseling Counseling given: Not Answered   Clinical Intake:  Pre-visit preparation completed: Yes  Pain : 0-10 Pain Score: 9  Pain Location: Abdomen Pain Onset: More than a month ago  How often do you need to have someone help you when you read instructions, pamphlets, or other written materials from your doctor or pharmacy?: 1 - Never  Diabetic? Nutrition Risk Assessment:  Has the patient had any N/V/D within the last 2 months?  No  Does the patient have any non-healing wounds?  No  Has the patient had any unintentional weight loss or weight gain?  No   Diabetes:  Is the patient diabetic?  Yes  If diabetic, was a CBG obtained today?  No  Did the patient bring in their glucometer from home?  No  How often do you monitor your CBG's? Twice daily.   Financial Strains and Diabetes Management:  Are you having any financial strains with the device, your supplies or your medication? No .  Does the patient want to be seen by Chronic Care Management for management of their diabetes?  No  Would the patient like to be referred to a Nutritionist or for Diabetic Management?  No   Diabetic Exams:  Diabetic Eye Exam: Overdue for diabetic eye exam. Pt has been advised about the importance in completing this exam.  Patient advised to call and schedule an eye exam. Diabetic Foot Exam: Completed 04/17/22    Interpreter Needed?: No  Information entered by :: Beatris Ship, [place]   Activities of Daily Living    10/06/2022    1:24 PM  In your present state of health, do you have any difficulty performing the following activities:  Hearing? 0  Vision? 0  Difficulty concentrating or making decisions? 0  Walking or climbing  stairs? 1  Dressing or bathing? 0  Doing errands, shopping? 0  Preparing Food and eating ? N  Using the Toilet? N  In the past six months, have you accidently leaked urine? Y  Comment wears depends when out of the home  Do you have problems with loss of bowel control? N  Managing your Medications? N  Managing your Finances? N  Housekeeping or managing your Housekeeping? N    Patient Care Team: Saguier, Iris Pert as PCP - General (Internal Medicine) Bo Merino, MD as Consulting Physician (Rheumatology) Rosemarie Ax, MD as Consulting Physician (Family Medicine)  Indicate any recent Medical Services you may have received from other than [facility] providers in the past year (date may be approximate).     Assessment:   This is a routine wellness examination for [place].  Hearing/Vision screen No results found.  Dietary issues and exercise activities discussed: Current Exercise Habits: The patient does not participate in regular exercise at present, Exercise limited by: orthopedic condition(s)   Goals Addressed   None    Depression Screen    10/06/2022    1:22 PM 10/01/2021    2:30 PM 06/27/2021    1:58 PM 03/28/2020    1:13 PM 12/29/2016    1:34 PM 12/29/2016   12:42 PM 02/09/2014    3:39 PM  PHQ 2/9 Scores  PHQ - 2 Score 1 0 0 _0 0    Fall Risk    10/06/2022    1:21 PM 10/01/2021    2:28 PM 06/27/2021    1:58 PM 12/29/2016    1:34 PM 12/29/2016   12:42 PM  [place] in the past year? 1 1 0 Yes Yes  Comment     Fell in her closet.  Number falls in past yr: 1 0 0 1 1  Injury with Fall? 0 1 0 No No  Risk for fall due to : History of fall(s) History of fall(s)     Follow up Falls evaluation completed Falls prevention discussed  Education provided;Falls prevention discussed     FALL RISK PREVENTION PERTAINING TO THE HOME:  Any stairs in or around the home? Yes  If so, are there any without handrails? No  Home free of loose throw rugs in walkways, pet  beds, electrical cords, etc? Yes  Adequate lighting in your home to reduce risk of falls? Yes   ASSISTIVE DEVICES UTILIZED TO PREVENT FALLS:  Life alert? No  Use of a cane, walker or w/c? Yes  Grab bars in the bathroom? No  Shower chair or bench in shower? Yes  Elevated toilet seat or a handicapped toilet? Yes   TIMED UP AND GO:  Was the test performed?  No, audio visit .   Cognitive Function:    12/29/2016    1:34 PM  MMSE - Mini Mental State Exam  Orientation to time 5  Orientation to Place 5  Registration 3  Attention/ Calculation 5  Recall 3  Language- name 2 objects 2  Language-  repeat 1  Language- follow 3 step command 3  Language- read & follow direction 1  Write a sentence 1  Copy design 0  Total score 29        10/06/2022    1:30 PM  6CIT Screen  What Year? 0 points  What month? 0 points  What time? 0 points  Count back from 20 2 points  Months in reverse 0 points  Repeat phrase 4 points  Total Score 6 points    Immunizations Immunization History  Administered Date(s) Administered   Fluad Quad(high Dose 65+) 08/11/2022   Influenza,inj,Quad PF,6+ Mos 08/30/2016   Influenza-Unspecified 08/18/2021   Moderna Covid-19 Vaccine Bivalent Booster 63yr & up 08/29/2021   Moderna Sars-Covid-2 Vaccination 12/25/2019, 01/11/2020, 02/05/2020   Pneumococcal Conjugate-13 05/22/2015   Pneumococcal Polysaccharide-23 10/06/2021    TDAP status: Due, Education has been provided regarding the importance of this vaccine. Advised may receive this vaccine at local pharmacy or Health Dept. Aware to provide a copy of the vaccination record if obtained from local pharmacy or Health Dept. Verbalized acceptance and understanding.  Flu Vaccine status: Up to date  Pneumococcal vaccine status: Up to date  Covid-19 vaccine status: Information provided on how to obtain vaccines.   Qualifies for Shingles Vaccine? Yes   Zostavax completed No   Shingrix Completed?: No.     Education has been provided regarding the importance of this vaccine. Patient has been advised to call insurance company to determine out of pocket expense if they have not yet received this vaccine. Advised may also receive vaccine at local pharmacy or Health Dept. Verbalized acceptance and understanding.  Screening Tests Health Maintenance  Topic Date Due   TETANUS/TDAP  Never done   Zoster Vaccines- Shingrix (1 of 2) Never done   OPHTHALMOLOGY EXAM  05/24/2021   COVID-19 Vaccine (5 - Moderna risk series) 10/24/2021   Diabetic kidney evaluation - Urine ACR  03/24/2022   Medicare Annual Wellness (AWV)  10/01/2022   HEMOGLOBIN A1C  11/04/2022   MAMMOGRAM  12/30/2022   FOOT EXAM  04/18/2023   Diabetic kidney evaluation - GFR measurement  09/25/2023   Pneumonia Vaccine 80 Years old  Completed   INFLUENZA VACCINE  Completed   DEXA SCAN  Completed   HPV VACCINES  Aged Out    Health Maintenance  Health Maintenance Due  Topic Date Due   TETANUS/TDAP  Never done   Zoster Vaccines- Shingrix (1 of 2) Never done   OPHTHALMOLOGY EXAM  05/24/2021   COVID-19 Vaccine (5 - Moderna risk series) 10/24/2021   Diabetic kidney evaluation - Urine ACR  03/24/2022   Medicare Annual Wellness (AWV)  10/01/2022    Colorectal cancer screening: No longer required.   Mammogram status: Completed 12/30/21. Repeat every year  Bone Density status: Completed 07/31/21. Results reflect: Bone density results: NORMAL. Repeat every 2 years.  Lung Cancer Screening: (Low Dose CT Chest recommended if Age 80-80years, 30 pack-year currently smoking OR have quit w/in 15years.) does not qualify.   Lung Cancer Screening Referral: N/a  Additional Screening:  Hepatitis C Screening: does not qualify  Vision Screening: Recommended annual ophthalmology exams for early detection of glaucoma and other disorders of the eye. Is the patient up to date with their annual eye exam?  Yes  Who is the provider or what is the name  of the office in which the patient attends annual eye exams? Dr. PJerline Painand Dr. RZadie RhineIf pt is not established with a provider, would  they like to be referred to a provider to establish care? No .   Dental Screening: Recommended annual dental exams for proper oral hygiene  Community Resource Referral / Chronic Care Management: CRR required this visit?  No   CCM required this visit?  No      Plan:     I have personally reviewed and noted the following in the patient's chart:   Medical and social history Use of alcohol, tobacco or illicit drugs  Current medications and supplements including opioid prescriptions. Patient is not currently taking opioid prescriptions. Functional ability and status Nutritional status Physical activity Advanced directives List of other physicians Hospitalizations, surgeries, and ER visits in previous 12 months Vitals Screenings to include cognitive, depression, and falls Referrals and appointments  In addition, I have reviewed and discussed with patient certain preventive protocols, quality metrics, and best practice recommendations. A written personalized care plan for preventive services as well as general preventive health recommendations were provided to patient.   Due to this being a telephonic visit, the after visit summary with patients personalized plan was offered to patient via mail or my-chart. Per request, patient was mailed a copy of AVS.  Beatris Ship, [place]   10/06/2022   Nurse Notes: None   Mackie Pai, PA-C

## 2022-10-06 NOTE — Patient Instructions (Signed)
Donna Fuller , Thank you for taking time to come for your Medicare Wellness Visit. I appreciate your ongoing commitment to your health goals. Please review the following plan we discussed and let me know if I can assist you in the future.   These are the goals we discussed:  Goals      Patient Stated     Maintain current health & independence        This is a list of the screening recommended for you and due dates:  Health Maintenance  Topic Date Due   Tetanus Vaccine  Never done   Zoster (Shingles) Vaccine (1 of 2) Never done   Eye exam for diabetics  05/24/2021   COVID-19 Vaccine (5 - Moderna risk series) 10/24/2021   Yearly kidney health urinalysis for diabetes  03/24/2022   Hemoglobin A1C  11/04/2022   Mammogram  12/30/2022   Complete foot exam   04/18/2023   Yearly kidney function blood test for diabetes  09/25/2023   Medicare Annual Wellness Visit  10/07/2023   Pneumonia Vaccine  Completed   Flu Shot  Completed   DEXA scan (bone density measurement)  Completed   HPV Vaccine  Aged Out     Next appointment: Follow up in one year for your annual wellness visit    Preventive Care 65 Years and Older, Female Preventive care refers to lifestyle choices and visits with your health care provider that can promote health and wellness. What does preventive care include? A yearly physical exam. This is also called an annual well check. Dental exams once or twice a year. Routine eye exams. Ask your health care provider how often you should have your eyes checked. Personal lifestyle choices, including: Daily care of your teeth and gums. Regular physical activity. Eating a healthy diet. Avoiding tobacco and drug use. Limiting alcohol use. Practicing safe sex. Taking low-dose aspirin every day. Taking vitamin and mineral supplements as recommended by your health care provider. What happens during an annual well check? The services and screenings done by your health care  provider during your annual well check will depend on your age, overall health, lifestyle risk factors, and family history of disease. Counseling  Your health care provider may ask you questions about your: Alcohol use. Tobacco use. Drug use. Emotional well-being. Home and relationship well-being. Sexual activity. Eating habits. History of falls. Memory and ability to understand (cognition). Work and work Statistician. Reproductive health. Screening  You may have the following tests or measurements: Height, weight, and BMI. Blood pressure. Lipid and cholesterol levels. These may be checked every 5 years, or more frequently if you are over 84 years old. Skin check. Lung cancer screening. You may have this screening every year starting at age 94 if you have a 30-pack-year history of smoking and currently smoke or have quit within the past 15 years. Fecal occult blood test (FOBT) of the stool. You may have this test every year starting at age 27. Flexible sigmoidoscopy or colonoscopy. You may have a sigmoidoscopy every 5 years or a colonoscopy every 10 years starting at age 43. Hepatitis C blood test. Hepatitis B blood test. Sexually transmitted disease (STD) testing. Diabetes screening. This is done by checking your blood sugar (glucose) after you have not eaten for a while (fasting). You may have this done every 1-3 years. Bone density scan. This is done to screen for osteoporosis. You may have this done starting at age 44. Mammogram. This may be done every 1-2 years.  Talk to your health care provider about how often you should have regular mammograms. Talk with your health care provider about your test results, treatment options, and if necessary, the need for more tests. Vaccines  Your health care provider may recommend certain vaccines, such as: Influenza vaccine. This is recommended every year. Tetanus, diphtheria, and acellular pertussis (Tdap, Td) vaccine. You may need a Td  booster every 10 years. Zoster vaccine. You may need this after age 60. Pneumococcal 13-valent conjugate (PCV13) vaccine. One dose is recommended after age 73. Pneumococcal polysaccharide (PPSV23) vaccine. One dose is recommended after age 79. Talk to your health care provider about which screenings and vaccines you need and how often you need them. This information is not intended to replace advice given to you by your health care provider. Make sure you discuss any questions you have with your health care provider. Document Released: 12/13/2015 Document Revised: 08/05/2016 Document Reviewed: 09/17/2015 Elsevier Interactive Patient Education  2017 [place] Prevention in the Home Falls can cause injuries. They can happen to people of all ages. There are many things you can do to make your home safe and to help prevent falls. What can I do on the outside of my home? Regularly fix the edges of walkways and driveways and fix any cracks. Remove anything that might make you trip as you walk through a door, such as a raised step or threshold. Trim any bushes or trees on the path to your home. Use bright outdoor lighting. Clear any walking paths of anything that might make someone trip, such as rocks or tools. Regularly check to see if handrails are loose or broken. Make sure that both sides of any steps have handrails. Any raised decks and porches should have guardrails on the edges. Have any leaves, snow, or ice cleared regularly. Use sand or salt on walking paths during winter. Clean up any spills in your garage right away. This includes oil or grease spills. What can I do in the bathroom? Use night lights. Install grab bars by the toilet and in the tub and shower. Do not use towel bars as grab bars. Use non-skid mats or decals in the tub or shower. If you need to sit down in the shower, use a plastic, non-slip stool. Keep the floor dry. Clean up any water that spills on the floor  as soon as it happens. Remove soap buildup in the tub or shower regularly. Attach bath mats securely with double-sided non-slip rug tape. Do not have throw rugs and other things on the floor that can make you trip. What can I do in the bedroom? Use night lights. Make sure that you have a light by your bed that is easy to reach. Do not use any sheets or blankets that are too big for your bed. They should not hang down onto the floor. Have a firm chair that has side arms. You can use this for support while you get dressed. Do not have throw rugs and other things on the floor that can make you trip. What can I do in the kitchen? Clean up any spills right away. Avoid walking on wet floors. Keep items that you use a lot in easy-to-reach places. If you need to reach something above you, use a strong step stool that has a grab bar. Keep electrical cords out of the way. Do not use floor polish or wax that makes floors slippery. If you must use wax, use non-skid floor wax.  Do not have throw rugs and other things on the floor that can make you trip. What can I do with my stairs? Do not leave any items on the stairs. Make sure that there are handrails on both sides of the stairs and use them. Fix handrails that are broken or loose. Make sure that handrails are as long as the stairways. Check any carpeting to make sure that it is firmly attached to the stairs. Fix any carpet that is loose or worn. Avoid having throw rugs at the top or bottom of the stairs. If you do have throw rugs, attach them to the floor with carpet tape. Make sure that you have a light switch at the top of the stairs and the bottom of the stairs. If you do not have them, ask someone to add them for you. What else can I do to help prevent falls? Wear shoes that: Do not have high heels. Have rubber bottoms. Are comfortable and fit you well. Are closed at the toe. Do not wear sandals. If you use a stepladder: Make sure that it is  fully opened. Do not climb a closed stepladder. Make sure that both sides of the stepladder are locked into place. Ask someone to hold it for you, if possible. Clearly mark and make sure that you can see: Any grab bars or handrails. First and last steps. Where the edge of each step is. Use tools that help you move around (mobility aids) if they are needed. These include: Canes. Walkers. Scooters. Crutches. Turn on the lights when you go into a dark area. Replace any light bulbs as soon as they burn out. Set up your furniture so you have a clear path. Avoid moving your furniture around. If any of your floors are uneven, fix them. If there are any pets around you, be aware of where they are. Review your medicines with your doctor. Some medicines can make you feel dizzy. This can increase your chance of falling. Ask your doctor what other things that you can do to help prevent falls. This information is not intended to replace advice given to you by your health care provider. Make sure you discuss any questions you have with your health care provider. Document Released: 09/12/2009 Document Revised: 04/23/2016 Document Reviewed: 12/21/2014 Elsevier Interactive Patient Education  2017 [employer].

## 2022-10-19 ENCOUNTER — Encounter: Payer: Self-pay | Admitting: Internal Medicine

## 2022-10-19 ENCOUNTER — Ambulatory Visit (INDEPENDENT_AMBULATORY_CARE_PROVIDER_SITE_OTHER): Payer: HMO | Admitting: Internal Medicine

## 2022-10-19 VITALS — BP 126/72 | HR 61 | Ht 61.0 in | Wt 190.0 lb

## 2022-10-19 DIAGNOSIS — E1142 Type 2 diabetes mellitus with diabetic polyneuropathy: Secondary | ICD-10-CM | POA: Diagnosis not present

## 2022-10-19 DIAGNOSIS — E1122 Type 2 diabetes mellitus with diabetic chronic kidney disease: Secondary | ICD-10-CM | POA: Diagnosis not present

## 2022-10-19 DIAGNOSIS — N1832 Chronic kidney disease, stage 3b: Secondary | ICD-10-CM | POA: Diagnosis not present

## 2022-10-19 LAB — POCT GLYCOSYLATED HEMOGLOBIN (HGB A1C): Hemoglobin A1C: 6.8 % — AB (ref 4.0–5.6)

## 2022-10-19 MED ORDER — GLIPIZIDE 5 MG PO TABS: 5.0000 mg | ORAL_TABLET | Freq: Two times a day (BID) | ORAL | 3 refills | Status: DC

## 2022-10-19 NOTE — Progress Notes (Signed)
Name: Donna Fuller  Age/ Sex: 81 y.o., female   MRN/ DOB: [id number], 1941-08-16     PCP: Elise Benne   Reason for Endocrinology Evaluation: Type 2 Diabetes Mellitus  Initial Endocrine Consultative Visit: 12/14/2019    PATIENT IDENTIFIER: [name] is a 81 y.o. female with a past medical history of T2DM, HTN, RA and Dyslipidemia. The patient has followed with Endocrinology clinic since 12/14/2019 for consultative assistance with management of her diabetes.  DIABETIC HISTORY:  [name] was diagnosed with T2DM many years ago. She was on metformin due to low renal function, has been on Januvia, Farxia and insulin as well. Her hemoglobin A1c has ranged from 6.0 %in 2015, peaking at 8.8% in 2020.  On her initial visit to our clinic her A1c was 8.8%   , she was on Trulicity and pioglitazone, we added glipizide   Stopped Pioglitazone 03/2022 due to A1c 5.7 %   SUBJECTIVE:   During the last visit (03/2022): A1c 5.7%     Today (10/19/2022): Ms. [name] is here for follow-up on diabetes management. She checks her blood sugars 2 times daily, preprandial to breakfast and dinnertime.  The patient has not had hypoglycemic episodes since the last clinic visit.   Has been snacking during the day  LE edema is improving  Denies nausea, vomiting or diarrhea but is having abdominal pain requiring Ed visits     HOME DIABETES REGIMEN:  Glipizide 5 mg , 1 tablet before Breast and half before supper  Trulicity 3 mg weekly- pt assistance     METER DOWNLOAD SUMMARY: 11/7-11/20/2023 Fingerstick Blood Glucose Tests = 14 Average Number Tests/Day = 1 Overall Mean FS Glucose = 150 Standard Deviation = 14  BG Ranges: Low = 131 High = 176  BG Target % Results: % In target = 100 % Over target = 0 % Under target = 0  Hypoglycemic Events/30 Days: BG < 50 = 0 Episodes of symptomatic severe hypoglycemia = 0    DIABETIC COMPLICATIONS: Microvascular  complications:  Neuropathy  Denies: retinopathy , CKD Last eye exam: Completed 02/11/2022   Macrovascular complications:    Denies: CAD, PVD, CVA     HISTORY:  Past Medical History:  Past Medical History:  Diagnosis Date   Arthritis    Breast cancer ([place]) 2007   Right breast, s/p mastectomy   Diabetes mellitus without complication ([place])    GERD (gastroesophageal reflux disease)    Gout    Hyperlipidemia    Hypertension    Spinal stenosis    Past Surgical History:  Past Surgical History:  Procedure Laterality Date   ABDOMINAL HYSTERECTOMY     ABDOMINAL SURGERY     BREAST BIOPSY Left    2016 benign    LEFT HEART CATH AND CORONARY ANGIOGRAPHY N/A 11/21/2021   Procedure: LEFT HEART CATH AND CORONARY ANGIOGRAPHY;  Surgeon: Wellington Hampshire, MD;  Location: [facility];  Service: Cardiovascular;  Laterality: N/A;   MASTECTOMY     right side   REPLACEMENT TOTAL KNEE Bilateral    TUBAL LIGATION     Social History:  reports that she has quit smoking. Her smoking use included cigarettes. She has never been exposed to tobacco smoke. She has never used smokeless tobacco. She reports that she does not drink alcohol and does not use drugs. Family History:  Family History  Problem Relation Age of Onset   Diabetes Mother    Alzheimer's disease Mother  Hypertension Mother    Hypertension Father    Heart disease Father    Hyperlipidemia Father    Kidney disease Father    Diabetes Sister    Cancer Brother        lung   Alzheimer's disease Brother    Heart disease Brother    Hypertension Brother    Diabetes Brother    Hypertension Brother    Diabetes Brother    Hyperlipidemia Son    Hypertension Son    Diabetes Son    Hypertension Daughter    Hyperlipidemia Daughter      HOME MEDICATIONS: Allergies as of 10/19/2022       Reactions   Penicillins Swelling   Sulfa Antibiotics Swelling   Amlodipine Swelling   LE Swelling   Lisinopril Cough         Medication List        Accurate as of October 19, 2022 10:55 AM. If you have any questions, ask your nurse or doctor.          acetaminophen 650 MG CR tablet Commonly known as: TYLENOL Take 650 mg by mouth 2 (two) times daily.   albuterol 108 (90 Base) MCG/ACT inhaler Commonly known as: VENTOLIN HFA TAKE 2 PUFFS BY MOUTH EVERY 6 HOURS AS NEEDED   ascorbic acid 500 MG tablet Commonly known as: VITAMIN C Take 500 mg by mouth daily.   aspirin 81 MG tablet Take 81 mg by mouth daily.   Biotin 1000 MCG Chew Chew by mouth.   blood glucose meter kit and supplies Kit Dispense based on patient and insurance preference. Use up to four times daily as directed. (FOR ICD-9 250.00, 250.01).   brimonidine 0.2 % ophthalmic solution Commonly known as: ALPHAGAN Place 1 drop into both eyes 2 (two) times a day.   dicyclomine 10 MG capsule Commonly known as: BENTYL Take 1 capsule (10 mg total) by mouth 2 (two) times daily as needed for spasms.   DULoxetine 30 MG capsule Commonly known as: CYMBALTA TAKE 1 CAPSULE BY MOUTH EVERY DAY   famotidine 20 MG tablet Commonly known as: PEPCID TAKE 1 TABLET BY MOUTH EVERY DAY   fluticasone 50 MCG/ACT nasal spray Commonly known as: FLONASE SPRAY 2 SPRAYS INTO EACH NOSTRIL EVERY DAY   gabapentin 300 MG capsule Commonly known as: NEURONTIN TAKE 1 CAPSULE BY MOUTH THREE TIMES A DAY   glipiZIDE 5 MG tablet Commonly known as: GLUCOTROL Take 1 tablet (5 mg total) by mouth daily before breakfast AND 0.5 tablets (2.5 mg total) daily before supper.   ketorolac 0.5 % ophthalmic solution Commonly known as: ACULAR INSTILL 1 DROP INTO RIGHT EYE 4 TIMES A DAY   latanoprost 0.005 % ophthalmic solution Commonly known as: XALATAN Place 1 drop into both eyes daily.   levocetirizine 5 MG tablet Commonly known as: XYZAL Take 1 tablet (5 mg total) by mouth every evening.   losartan 50 MG tablet Commonly known as: COZAAR Take 50 mg by mouth  daily.   metoprolol succinate 25 MG 24 hr tablet Commonly known as: TOPROL-XL Take 25 mg by mouth daily.   montelukast 10 MG tablet Commonly known as: SINGULAIR Take 10 mg by mouth at bedtime.   omeprazole 40 MG capsule Commonly known as: PRILOSEC TAKE 1 CAPSULE (40 MG TOTAL) BY MOUTH 2 (TWO) TIMES DAILY   OneTouch Delica Lancets 28U Misc Use as instructed to check blood sugar 2-3 times a day.  DX E11.9   OneTouch Ultra test strip  Generic drug: glucose blood CHECK BLOOD SUGAR 2 TO 3 TIMES DAILY AS DIRECTED   pravastatin 20 MG tablet Commonly known as: PRAVACHOL TAKE 1 TABLET BY MOUTH ONCE DAILY   sucralfate 1 g tablet Commonly known as: CARAFATE TAKE 1 TABLET (1 G TOTAL) BY MOUTH 4 TIMES A DAY WITH MEALS AND AT BEDTIME   timolol 0.5 % ophthalmic solution Commonly known as: TIMOPTIC Place 1 drop into both eyes 2 (two) times a day.   torsemide 5 MG tablet Commonly known as: DEMADEX Take 5 mg by mouth daily.   Trulicity 3 KL/4.9ZP Sopn Generic drug: Dulaglutide Inject 3 mg as directed once a week.   Vitamin D-3 125 MCG (5000 UT) Tabs Take 1 tablet by mouth daily.         OBJECTIVE:   Vital Signs: BP 126/72 (BP Location: Left Arm, Patient Position: Sitting, Cuff Size: Large)   Pulse 61   Ht _0  (1.549 m)   Wt 190 lb (86.2 kg)   SpO2 98%   BMI 35.90 kg/m   Wt Readings from Last 3 Encounters:  10/19/22 190 lb (86.2 kg)  09/24/22 185 lb (83.9 kg)  08/14/22 178 lb (80.7 kg)     Exam: General: Pt appears well and is in NAD  Lungs: Clear with good BS bilat with no rales, rhonchi, or wheezes  Heart: RRR with normal S1 and S2 and no gallops; no murmurs; no rub  Extremities: No  pretibial edema.  Neuro: MS is good with appropriate affect, pt is alert and Ox3    DM foot exam:   04/17/2022  The skin of the feet is intact without sores or ulcerations. The pedal pulses are 1+ on right and 1+ on left. The sensation is intact to a screening 5.07, 10 gram  monofilament bilaterally    DATA REVIEWED:  Lab Results  Component Value Date   HGBA1C 6.8 (A) 10/19/2022   HGBA1C 6.2 05/05/2022   HGBA1C 5.7 (A) 04/17/2022   Lab Results  Component Value Date   MICROALBUR 0.4 04/21/2017   LDLCALC 77 05/31/2019   CREATININE 1.43 (H) 09/24/2022    Latest Reference Range & Units 09/24/22 09:58  Sodium 135 - 145 mmol/L 140  Potassium 3.5 - 5.1 mmol/L 4.3  Chloride 98 - 111 mmol/L 102  CO2 22 - 32 mmol/L 30  Glucose 70 - 99 mg/dL 124 (H)  BUN 8 - 23 mg/dL 28 (H)  Creatinine 0.44 - 1.00 mg/dL 1.43 (H)  Calcium 8.9 - 10.3 mg/dL 10.0  Anion gap 5 - 15  8  Alkaline Phosphatase 38 - 126 U/L 117  Albumin 3.5 - 5.0 g/dL 4.2  AST 15 - 41 U/L 17  ALT 0 - 44 U/L 13  Total Protein 6.5 - 8.1 g/dL 7.2  Total Bilirubin 0.3 - 1.2 mg/dL 0.5  GFR, Estimated >60 mL/min 37 (L)      ASSESSMENT / PLAN / RECOMMENDATIONS:   1) Type 2 Diabetes Mellitus, optimally controlled, With neuropathic and CKD III complications - Most recent A1c of 6.8 %. Goal A1c <7.5%.   - A1c is skewed due to CKD  - Stopped  Pioglitazone due to LE edema  - She is having abdominal pain, will reduce Trulicity,she is on pt assistance for trulicity  and her form was filled today  - IN the meantime she was advised to increase glipizide as below  - Intolerant to [place]   MEDICATIONS: - Decrease glipizide 5 mg, 1 tablet before Breakfast and  1 tablet before supper - Decrease  Trulicity 3 mg weekly  EDUCATION / INSTRUCTIONS: BG monitoring instructions: Patient is instructed to check her blood sugars 2 times a day, fasting and suppertime. Call [facility] if: BG persistently < 70  I reviewed the Rule of 15 for the treatment of hypoglycemia in detail with the patient. Literature supplied.    2) Diabetic complications:  Eye: Does not have known diabetic retinopathy.  Neuro/ Feet: Does  have known diabetic peripheral neuropathy. Renal: Patient does not have known  baseline CKD. She is  on an ACEI/ARB at present.  F/U in 6 months   Signed electronically by: Mack Guise, MD  [facility]  [address] [address] [place], [place] [address] Phone: [phone] FAX: [phone]   CC: Elise Benne [address] [place] [address] Phone: [phone]  Fax: [phone]  Return to Endocrinology clinic as below: Future Appointments  Date Time Provider [place]  10/19/2022 11:50 AM Daziyah Cogan, Melanie Crazier, MD [facility] None  02/02/2023 11:00 AM Bo Merino, MD [facility] None  02/15/2023 10:30 AM Rankin, Clent Demark, MD [facility] None

## 2022-10-19 NOTE — Patient Instructions (Signed)
-   Decrease  trulicity 1.5 mg weekly  - Increase  Glipizide 5 mg , 1 tablet before Breakfast, and 1 tablet before supper     - HOW TO TREAT LOW BLOOD SUGARS (Blood sugar LESS THAN 70 MG/DL) Please follow the RULE OF 15 for the treatment of hypoglycemia treatment (when your (blood sugars are less than 70 mg/dL)   STEP 1: Take 15 grams of carbohydrates when your blood sugar is low, which includes:  3-4 GLUCOSE TABS  OR 3-4 OZ OF JUICE OR REGULAR SODA OR ONE TUBE OF GLUCOSE GEL    STEP 2: RECHECK blood sugar in 15 MINUTES STEP 3: If your blood sugar is still low at the 15 minute recheck --> then, go back to STEP 1 and treat AGAIN with another 15 grams of carbohydrates.

## 2022-10-20 ENCOUNTER — Telehealth: Payer: Self-pay

## 2022-10-20 NOTE — Telephone Encounter (Signed)
[facility] application has been faxed

## 2022-10-30 ENCOUNTER — Other Ambulatory Visit: Payer: Self-pay | Admitting: Medical

## 2022-11-09 ENCOUNTER — Other Ambulatory Visit: Payer: Self-pay | Admitting: Medical

## 2022-12-01 ENCOUNTER — Other Ambulatory Visit: Payer: Self-pay | Admitting: Medical

## 2022-12-01 DIAGNOSIS — Z1231 Encounter for screening mammogram for malignant neoplasm of breast: Secondary | ICD-10-CM

## 2022-12-04 ENCOUNTER — Other Ambulatory Visit: Payer: Self-pay | Admitting: Medical

## 2022-12-05 ENCOUNTER — Other Ambulatory Visit: Payer: Self-pay | Admitting: Medical

## 2022-12-05 DIAGNOSIS — E1165 Type 2 diabetes mellitus with hyperglycemia: Secondary | ICD-10-CM

## 2022-12-08 ENCOUNTER — Ambulatory Visit: Payer: HMO | Admitting: Family Medicine

## 2022-12-09 ENCOUNTER — Ambulatory Visit (HOSPITAL_BASED_OUTPATIENT_CLINIC_OR_DEPARTMENT_OTHER)
Admission: RE | Admit: 2022-12-09 | Discharge: 2022-12-09 | Disposition: A | Discharge status: Home / Self Care | Payer: HMO | Source: Ambulatory Visit | Attending: Medical | Admitting: Medical

## 2022-12-09 ENCOUNTER — Ambulatory Visit (INDEPENDENT_AMBULATORY_CARE_PROVIDER_SITE_OTHER): Payer: PPO | Admitting: Medical

## 2022-12-09 ENCOUNTER — Encounter: Payer: Self-pay | Admitting: Medical

## 2022-12-09 VITALS — BP 153/88 | HR 96 | Resp 18 | Ht 61.0 in | Wt 179.0 lb

## 2022-12-09 DIAGNOSIS — R059 Cough, unspecified: Secondary | ICD-10-CM | POA: Diagnosis not present

## 2022-12-09 DIAGNOSIS — I10 Essential (primary) hypertension: Secondary | ICD-10-CM | POA: Diagnosis not present

## 2022-12-09 DIAGNOSIS — K219 Gastro-esophageal reflux disease without esophagitis: Secondary | ICD-10-CM | POA: Diagnosis not present

## 2022-12-09 DIAGNOSIS — R921 Mammographic calcification found on diagnostic imaging of breast: Secondary | ICD-10-CM | POA: Diagnosis not present

## 2022-12-09 DIAGNOSIS — R0600 Dyspnea, unspecified: Secondary | ICD-10-CM | POA: Diagnosis not present

## 2022-12-09 MED ORDER — METOPROLOL SUCCINATE ER 25 MG PO TB24: 25.0000 mg | ORAL_TABLET | Freq: Every day | ORAL | 3 refills | Status: DC

## 2022-12-09 MED ORDER — LOSARTAN POTASSIUM 50 MG PO TABS: 50.0000 mg | ORAL_TABLET | Freq: Every day | ORAL | 3 refills | Status: DC

## 2022-12-09 NOTE — Patient Instructions (Addendum)
Htn- your bp is mild high today but you have been off of bp medication for at least one week. Sending in refill of losartan today. Check bp at home daily and update me on bp readings in one week.  Some dysphagia/possible gerd. Continue omeprazole and can add on famotidine. On review you have rx of famotidine.   Also follow endocrinolgist advise on decreasing dose or trulicity as you report endocrinologist thought maybe causing Gi side effect.  For describe 2 month of cough, increase of shortness of breath  and bringing up mucus will place chest xray.   Follow up 3-6 months or sooner if needed

## 2022-12-09 NOTE — Progress Notes (Signed)
Subjective:    Patient ID: Donna Fuller, female    DOB: 09/20/1941, 82 y.o.   MRN: [id number]  HPI Pt in for follow up.  Pt states about one week ago she ran out of bp medication. Pt ran out losartan 50 mg daily. No other bp medication. No cardiac or neurologic signs or symptoms today. Though states faint mild ha over past week occasionally.  She has gerd- pt in past has been on omeprazole and famotadine.  Pt saw endocrinologist and was told maybe the trulcity is causing gi side effects. Endocrinologist is working on decreasing current dose.  On review severe rash to covid vaccine left upper arm. So she does not want covid vaccine.  Pt states she had viral syndrome like illness after flu vaccine.    Review of Systems  Constitutional:  Negative for chills, fatigue and fever.  HENT:  Negative for congestion, ear discharge and ear pain.   Respiratory:  Negative for cough, chest tightness, shortness of breath and wheezing.   Cardiovascular:  Negative for chest pain and palpitations.  Gastrointestinal:  Negative for abdominal pain and blood in stool.  Genitourinary:  Negative for dysuria.  Musculoskeletal:  Negative for back pain, joint swelling and myalgias.  Neurological:  Negative for dizziness, seizures, syncope, weakness and light-headedness.  Hematological:  Negative for adenopathy. Does not bruise/bleed easily.  Psychiatric/Behavioral:  Negative for behavioral problems and confusion.     Past Medical History:  Diagnosis Date   Arthritis    Breast cancer ([place]) 2007   Right breast, s/p mastectomy   Diabetes mellitus without complication (HCC)    GERD (gastroesophageal reflux disease)    Gout    Hyperlipidemia    Hypertension    Spinal stenosis      Social History   Socioeconomic History   Marital status: Married    Spouse name: Jimmy   Number of children: 3   Years of education: 12   Highest education level: Not on file  Occupational History   Not on file   Tobacco Use   Smoking status: Former    Years: 5.00    Types: Cigarettes    Passive exposure: Never   Smokeless tobacco: Never  Vaping Use   Vaping Use: Never used  Substance and Sexual Activity   Alcohol use: No   Drug use: No   Sexual activity: Not Currently  Other Topics Concern   Not on file  Social History Narrative   Marital Status:  Married Manufacturing systems engineer)    Children:  Daughter(1) Son (1)    Pets:  None    Living Situation: Lives with husband and daughter.     Occupation:  Retired Licensed conveyancer)    Education: 12th Grade    Tobacco Use/Exposure:  She used to smoke socially during the weekends but quit 40 years ago.    Alcohol Use:  Occasional   Drug Use:  None   Diet:  Regular   Exercise:  None   Hobbies:  Reading and playing volleyball             Social Determinants of Health   Financial Resource Strain: Low Risk  (10/01/2021)   Overall Financial Resource Strain (CARDIA)    Difficulty of Paying Living Expenses: Not hard at all  Food Insecurity: No Food Insecurity (10/01/2021)   Hunger Vital Sign    Worried About Running Out of Food in the Last Year: Never true    Ran Out of Food in the Last Year:  Never true  Transportation Needs: No Transportation Needs (10/01/2021)   PRAPARE - Hydrologist (Medical): No    Lack of Transportation (Non-Medical): No  Physical Activity: Inactive (10/01/2021)   Exercise Vital Sign    Days of Exercise per Week: 0 days    Minutes of Exercise per Session: 0 min  Stress: No Stress Concern Present (10/01/2021)   [place]    Feeling of Stress : Not at all  Social Connections: Moderately Integrated (10/01/2021)   Social Connection and Isolation Panel [NHANES]    Frequency of Communication with Friends and Family: More than three times a week    Frequency of Social Gatherings with Friends and Family: More than three times a week    Attends Religious  Services: More than 4 times per year    Active Member of [employer] or Organizations: No    Attends Archivist Meetings: Never    Marital Status: Married  Human resources officer Violence: Not At Risk (10/01/2021)   Humiliation, Afraid, Rape, and Kick questionnaire    Fear of Current or Ex-Partner: No    Emotionally Abused: No    Physically Abused: No    Sexually Abused: No    Past Surgical History:  Procedure Laterality Date   ABDOMINAL HYSTERECTOMY     ABDOMINAL SURGERY     BREAST BIOPSY Left    2016 benign    LEFT HEART CATH AND CORONARY ANGIOGRAPHY N/A 11/21/2021   Procedure: LEFT HEART CATH AND CORONARY ANGIOGRAPHY;  Surgeon: Wellington Hampshire, MD;  Location: [facility];  Service: Cardiovascular;  Laterality: N/A;   MASTECTOMY     right side   REPLACEMENT TOTAL KNEE Bilateral    TUBAL LIGATION      Family History  Problem Relation Age of Onset   Diabetes Mother    Alzheimer's disease Mother    Hypertension Mother    Hypertension Father    Heart disease Father    Hyperlipidemia Father    Kidney disease Father    Diabetes Sister    Cancer Brother        lung   Alzheimer's disease Brother    Heart disease Brother    Hypertension Brother    Diabetes Brother    Hypertension Brother    Diabetes Brother    Hyperlipidemia Son    Hypertension Son    Diabetes Son    Hypertension Daughter    Hyperlipidemia Daughter     Allergies  Allergen Reactions   Penicillins Swelling   Sulfa Antibiotics Swelling   Amlodipine Swelling    LE Swelling   Lisinopril Cough    Current Outpatient Medications on File Prior to Visit  Medication Sig Dispense Refill   acetaminophen (TYLENOL) 650 MG CR tablet Take 650 mg by mouth 2 (two) times daily.     albuterol (VENTOLIN HFA) 108 (90 Base) MCG/ACT inhaler INHALE 2 PUFFS BY MOUTH EVERY 6 HOURS AS NEEDED 8.5 each 2   ascorbic acid (VITAMIN C) 500 MG tablet Take 500 mg by mouth daily.     aspirin 81 MG tablet Take 81 mg by  mouth daily.     Biotin 1000 MCG CHEW Chew by mouth.     blood glucose meter kit and supplies KIT Dispense based on patient and insurance preference. Use up to four times daily as directed. (FOR ICD-9 250.00, 250.01). 1 each 11   brimonidine (ALPHAGAN) 0.2 % ophthalmic solution  Place 1 drop into both eyes 2 (two) times a day.     Cholecalciferol (VITAMIN D-3) 5000 units TABS Take 1 tablet by mouth daily.     dicyclomine (BENTYL) 10 MG capsule Take 1 capsule (10 mg total) by mouth 2 (two) times daily as needed for spasms. 14 capsule 0   Dulaglutide (TRULICITY) 3 WU/1.3KG SOPN Inject 3 mg as directed once a week. 6 mL 3   DULoxetine (CYMBALTA) 30 MG capsule TAKE 1 CAPSULE BY MOUTH EVERY DAY 90 capsule 2   famotidine (PEPCID) 20 MG tablet TAKE 1 TABLET BY MOUTH EVERY DAY 90 tablet 1   fluticasone (FLONASE) 50 MCG/ACT nasal spray SPRAY 2 SPRAYS INTO EACH NOSTRIL EVERY DAY 48 mL 1   gabapentin (NEURONTIN) 300 MG capsule TAKE 1 CAPSULE BY MOUTH THREE TIMES A DAY 90 capsule 2   glipiZIDE (GLUCOTROL) 5 MG tablet Take 1 tablet (5 mg total) by mouth 2 (two) times daily before a meal. 180 tablet 3   ketorolac (ACULAR) 0.5 % ophthalmic solution INSTILL 1 DROP INTO RIGHT EYE 4 TIMES A DAY 5 mL 6   latanoprost (XALATAN) 0.005 % ophthalmic solution Place 1 drop into both eyes daily.     levocetirizine (XYZAL) 5 MG tablet Take 1 tablet (5 mg total) by mouth every evening. 30 tablet 3   losartan (COZAAR) 50 MG tablet Take 50 mg by mouth daily.     metoprolol succinate (TOPROL-XL) 25 MG 24 hr tablet Take 25 mg by mouth daily.     montelukast (SINGULAIR) 10 MG tablet Take 10 mg by mouth at bedtime.     omeprazole (PRILOSEC) 40 MG capsule TAKE 1 CAPSULE (40 MG TOTAL) BY MOUTH 2 (TWO) TIMES DAILY 180 capsule 1   OneTouch Delica Lancets 40N MISC Use as instructed to check blood sugar 2-3 times a day.  DX E11.9 100 each 3   ONETOUCH ULTRA test strip CHECK BLOOD SUGAR 2 TO 3 TIMES DAILY AS DIRECTED 100 strip 5    pravastatin (PRAVACHOL) 20 MG tablet TAKE 1 TABLET BY MOUTH ONCE DAILY (Patient taking differently: Take 20 mg by mouth daily.) 90 tablet 0   sucralfate (CARAFATE) 1 g tablet TAKE 1 TABLET (1 G TOTAL) BY MOUTH 4 TIMES A DAY WITH MEALS AND AT BEDTIME 90 tablet 0   timolol (TIMOPTIC) 0.5 % ophthalmic solution Place 1 drop into both eyes 2 (two) times a day.     torsemide (DEMADEX) 5 MG tablet Take 5 mg by mouth daily.     No current facility-administered medications on file prior to visit.    BP (!) 153/88   Pulse 96   Resp 18   Ht '5\' 1"'$  (1.549 m)   Wt 179 lb (81.2 kg)   SpO2 96%   BMI 33.82 kg/m          Objective:   Physical Exam  General Mental Status- Alert. General Appearance- Not in acute distress.   Skin General: Color- Normal Color. Moisture- Normal Moisture.  Neck Carotid Arteries- Normal color. Moisture- Normal Moisture. No carotid bruits. No JVD.  Chest and Lung Exam Auscultation: Breath Sounds:-Normal.  Cardiovascular Auscultation:Rythm- Regular. Murmurs & Other Heart Sounds:Auscultation of the heart reveals- No Murmurs.  Abdomen Inspection:-Inspeection Normal. Palpation/Percussion:Note:No mass. Palpation and Percussion of the abdomen reveal- Non Tender, Non Distended + BS, no rebound or guarding.    Neurologic Cranial Nerve exam:- CN III-XII intact(No nystagmus), symmetric smile. Drift Test:- No drift. Romberg Exam:- Negative.  Heal to Toe Gait  exam:-Normal. Finger to Nose:- Normal/Intact Strength:- 5/5 equal and symmetric strength both upper and lower extremities.   Lower ext- calfs symmetric, not swollen. Negative homans signs.    Assessment & Plan:   Patient Instructions  Htn- your bp is mild high today but you have been off of bp medication for at least one week. Sending in refill of losartan today. Check bp at home daily and update me on bp readings in one week.  Some dysphagia/possible gerd. Continue omeprazole and can add on famotidine.  On review you have rx of famotidine.   Also follow endocrinolgist advise on decreasing dose or trulicity as you report endocrinologist thought maybe causing Gi side effect.  For describe 2 month of cough, increase of shortness of breath  and bringing up mucus will place chest xray.   Follow up 3-6 months or sooner if needed     [employer], PA-C

## 2022-12-10 ENCOUNTER — Other Ambulatory Visit: Payer: Self-pay

## 2022-12-10 MED ORDER — AZITHROMYCIN 250 MG PO TABS: ORAL_TABLET | ORAL | 0 refills | Status: AC

## 2022-12-10 NOTE — Addendum Note (Signed)
Addended by: Anabel Halon on: 12/10/2022 12:22 PM   Modules accepted: Orders

## 2022-12-17 ENCOUNTER — Telehealth: Payer: Self-pay | Admitting: Medical

## 2022-12-17 NOTE — Telephone Encounter (Signed)
Pt called with the following BP readings for Donna Fuller:   1.11.24 - 154/83 1.12.24 - 156/83 1.13.24 - 142/71 1.14.24 - 149/93 1.15.24 -  133/74 1.17.24 - 131/68 1.18.24 - 116/47

## 2022-12-18 NOTE — Telephone Encounter (Signed)
Pt called back and pt notified

## 2022-12-18 NOTE — Telephone Encounter (Signed)
Pt called and lvm to return call 

## 2023-01-19 ENCOUNTER — Ambulatory Visit
Admission: RE | Admit: 2023-01-19 | Discharge: 2023-01-19 | Disposition: A | Discharge status: Home / Self Care | Payer: PPO | Source: Ambulatory Visit | Attending: Medical | Admitting: Medical

## 2023-01-19 DIAGNOSIS — Z1231 Encounter for screening mammogram for malignant neoplasm of breast: Secondary | ICD-10-CM | POA: Diagnosis not present

## 2023-01-19 NOTE — Progress Notes (Signed)
Office Visit Note  Patient: Donna Fuller             Date of Birth: [birth year]           MRN: [id number]             PCP: Elise Benne Referring: Elise Benne Visit Date: 02/02/2023 Occupation: '@GUAROCC'$ @  Subjective:  Joint Pain   History of Present Illness: Donna Fuller is a 82 y.o. female seropositive rheumatoid arthritis, carpal tunnel syndrome, lumbar DDD and osteoarthritis. Patient is not feeling well today and has widespread muscle and joint aches in her lower back, legs and hands. She has swelling in her hands and ankles. She takes tylenol 650 mg twice daily as needed for the aches. She rates her rheumatoid arthritis as a 5/10 today but states some days is a 10/10. She has history of carpal tunnel syndrome. It started in the right hand and is now in left hand too. She is using the carpal tunnel braces and is to follow up with orthopedics when ready for surgery. States her lower back is very painful today. She ambulates with a cane. She has not followed up with orthopedics.  She states that her previous orthopedic surgeon was very far from [place] and he does not except her insurance anymore.  She would like to establish with an orthopedic surgeon locally.  She has bilateral trochanteric bursitis and has been doing the exercises.   Activities of Daily Living:  Patient reports morning stiffness for 4 hours.   Patient Reports nocturnal pain.  Difficulty dressing/grooming: Denies Difficulty climbing stairs: Reports Difficulty getting out of chair: Reports Difficulty using hands for taps, buttons, cutlery, and/or writing: Reports  Review of Systems  Constitutional:  Positive for fatigue.  HENT:  Positive for mouth dryness. Negative for mouth sores.   Eyes:  Positive for dryness.  Respiratory:  Positive for shortness of breath.   Cardiovascular:  Positive for chest pain and palpitations.  Gastrointestinal:  Positive for constipation. Negative for blood in  stool and diarrhea.  Endocrine: Negative for increased urination.  Genitourinary:  Positive for involuntary urination.  Musculoskeletal:  Positive for joint pain, gait problem, joint pain, joint swelling, myalgias, muscle weakness, morning stiffness, muscle tenderness and myalgias.  Skin:  Positive for hair loss. Negative for color change, rash and sensitivity to sunlight.  Allergic/Immunologic: Positive for susceptible to infections.  Neurological:  Positive for dizziness and headaches.  Hematological:  Negative for swollen glands.  Psychiatric/Behavioral:  Positive for sleep disturbance. Negative for depressed mood. The patient is nervous/anxious.     PMFS History:  Patient Active Problem List   Diagnosis Date Noted   Type 2 diabetes mellitus with stage 3b chronic kidney disease, without long-term current use of insulin ([place]) 04/17/2022   Angina pectoris ([place]) 11/21/2021   Diabetes mellitus type 2 with complications ([place]) [id number]   CAD (coronary artery disease) 11/21/2021   LV hypertrophy, hypertensive 11/21/2021   Elevated troponin 11/21/2021   Atypical chest pain 11/20/2021   DDD (degenerative disc disease), lumbar 11/03/2021   Rheumatoid factor positive 07/29/2021   History of total bilateral knee replacement 07/29/2021   Other idiopathic scoliosis, thoracic region 07/08/2021   Estrogen deficiency 07/08/2021   Arthralgia of both hands 07/08/2021   Moderate nonproliferative diabetic retinopathy of left eye ([place]) 02/11/2021   Pseudophakia 02/11/2021   Primary open angle glaucoma of both eyes, moderate stage 02/11/2021   Cystoid macular edema, right eye 02/11/2021   Type 2 diabetes  mellitus with hyperglycemia, without long-term current use of insulin ([place]) 12/14/2019   Type 2 diabetes mellitus with diabetic polyneuropathy, without long-term current use of insulin ([place]) 12/14/2019   Pleuritic chest pain 05/30/2019   Fever 05/30/2019   Yeast vaginitis 07/19/2017   Urinary  frequency 07/19/2017   CKD (chronic kidney disease) stage 3, GFR 30-59 ml/min (HCC) 05/20/2017   Gout 05/20/2017   Hyperlipidemia 09/26/2016   Spinal stenosis 09/26/2016   History of breast cancer 09/26/2016   Diabetes mellitus type 2, controlled ([place]) 09/26/2016   GERD (gastroesophageal reflux disease) 09/26/2016   Facet arthropathy, lumbosacral 03/13/2015   Neuropathy 03/13/2015   Hypertension    Lumbar radiculopathy 01/08/2014    Past Medical History:  Diagnosis Date   Arthritis    Breast cancer ([place]) 2007   Right breast, s/p mastectomy   Diabetes mellitus without complication (HCC)    GERD (gastroesophageal reflux disease)    Gout    Hyperlipidemia    Hypertension    Spinal stenosis     Family History  Problem Relation Age of Onset   Diabetes Mother    Alzheimer's disease Mother    Hypertension Mother    Hypertension Father    Heart disease Father    Hyperlipidemia Father    Kidney disease Father    Diabetes Sister    Cancer Brother        lung   Alzheimer's disease Brother    Heart disease Brother    Hypertension Brother    Diabetes Brother    Hypertension Brother    Diabetes Brother    Hyperlipidemia Son    Hypertension Son    Diabetes Son    Hypertension Daughter    Hyperlipidemia Daughter    Past Surgical History:  Procedure Laterality Date   ABDOMINAL HYSTERECTOMY     ABDOMINAL SURGERY     BREAST BIOPSY Left    2016 benign    LEFT HEART CATH AND CORONARY ANGIOGRAPHY N/A 11/21/2021   Procedure: LEFT HEART CATH AND CORONARY ANGIOGRAPHY;  Surgeon: Wellington Hampshire, MD;  Location: [facility];  Service: Cardiovascular;  Laterality: N/A;   MASTECTOMY     right side   REPLACEMENT TOTAL KNEE Bilateral    TUBAL LIGATION     Social History   Social History Narrative   Marital Status:  Married Manufacturing systems engineer)    Children:  Daughter(1) Son (1)    Pets:  None    Living Situation: Lives with husband and daughter.     Occupation:  Retired Licensed conveyancer)     Education: 12th Grade    Tobacco Use/Exposure:  She used to smoke socially during the weekends but quit 40 years ago.    Alcohol Use:  Occasional   Drug Use:  None   Diet:  Regular   Exercise:  None   Hobbies:  Reading and playing volleyball             Immunization History  Administered Date(s) Administered   Fluad Quad(high Dose 65+) 08/11/2022   Influenza,inj,Quad PF,6+ Mos 08/30/2016   Influenza-Unspecified 08/18/2021, 08/15/2022   Moderna Covid-19 Vaccine Bivalent Booster 48yr & up 08/29/2021   Moderna Sars-Covid-2 Vaccination 12/25/2019, 01/11/2020, 02/05/2020   Pneumococcal Conjugate-13 05/22/2015   Pneumococcal Polysaccharide-23 10/06/2021     Objective: Vital Signs: BP 99/65 (BP Location: Left Arm, Patient Position: Sitting, Cuff Size: Normal)   Pulse 75   Resp 14   Ht '5\' 1"'$  (1.549 m)   Wt 180 lb (81.6 kg)  BMI 34.01 kg/m    Physical Exam Vitals and nursing note reviewed.  Constitutional:      Appearance: She is well-developed.  HENT:     Head: Normocephalic and atraumatic.  Eyes:     Conjunctiva/sclera: Conjunctivae normal.  Cardiovascular:     Rate and Rhythm: Normal rate and regular rhythm.     Heart sounds: Normal heart sounds.  Pulmonary:     Effort: Pulmonary effort is normal.     Breath sounds: Normal breath sounds.  Abdominal:     General: Bowel sounds are normal.     Palpations: Abdomen is soft.  Musculoskeletal:     Cervical back: Normal range of motion.  Lymphadenopathy:     Cervical: No cervical adenopathy.  Skin:    General: Skin is warm and dry.     Capillary Refill: Capillary refill takes less than 2 seconds.  Neurological:     Mental Status: She is alert and oriented to person, place, and time.  Psychiatric:        Behavior: Behavior normal.      Musculoskeletal Exam: Cervical spine was in good range of motion.  She had limited painful range of motion of the lumbar spine.. Elbow joints, wrist joints, MCPs PIPs and DIPs have good  range of motion with no synovitis.  Shoulder abduction limited to 120 degrees. Contractures of the left, third PIP and all right PIPs. Thickening of DIPs and PIPs bilaterally. Hip joints, and knee joints were in good range of motion.  There was no tenderness over ankles, MTPs and PIPs.  CDAI Exam: CDAI Score: -- Patient Global: 5 mm; Provider Global: 1 mm Swollen: --; Tender: -- Joint Exam 02/02/2023   No joint exam has been documented for this visit   There is currently no information documented on the homunculus. Go to the Rheumatology activity and complete the homunculus joint exam.  Investigation: No additional findings.  Imaging: MM 3D SCREEN BREAST UNI LEFT  Result Date: 01/21/2023 CLINICAL DATA:  Screening. EXAM: DIGITAL SCREENING UNILATERAL LEFT MAMMOGRAM WITH CAD AND TOMOSYNTHESIS TECHNIQUE: Left screening digital craniocaudal and mediolateral oblique mammograms were obtained. Left screening digital breast tomosynthesis was performed. The images were evaluated with computer-aided detection. COMPARISON:  Previous exam(s). ACR Breast Density Category b: There are scattered areas of fibroglandular density. FINDINGS: In the left breast, a possible asymmetry and possible calcifications warrants further evaluation. In the right breast, no findings suspicious for malignancy. IMPRESSION: Further evaluation is suggested for possible asymmetry and possible calcifications in the left breast. RECOMMENDATION: Diagnostic mammogram and possibly ultrasound of the left breast. (Code:[id number]) The patient will be contacted regarding the findings, and additional imaging will be scheduled. BI-RADS CATEGORY  0: Incomplete: Need additional imaging evaluation. Electronically Signed   By: Abelardo Diesel M.D.   On: 01/21/2023 14:33    Recent Labs: Lab Results  Component Value Date   WBC 6.9 09/24/2022   HGB 13.2 09/24/2022   PLT 182 09/24/2022   NA 140 09/24/2022   K 4.3 09/24/2022   CL 102 09/24/2022    CO2 30 09/24/2022   GLUCOSE 124 (H) 09/24/2022   BUN 28 (H) 09/24/2022   CREATININE 1.43 (H) 09/24/2022   BILITOT 0.5 09/24/2022   ALKPHOS 117 09/24/2022   AST 17 09/24/2022   ALT 13 09/24/2022   PROT 7.2 09/24/2022   ALBUMIN 4.2 09/24/2022   CALCIUM 10.0 09/24/2022   GFRAA 51 (L) 06/03/2019   QFTBGOLDPLUS NEGATIVE 12/04/2021    Speciality Comments: No specialty comments  available.  Procedures:  No procedures performed Allergies: Penicillins, Sulfa antibiotics, Amlodipine, and Lisinopril   Assessment / Plan:     Visit Diagnoses: Seropositive rheumatoid arthritis ([place]) - RF+, Erosive arthritis, history of extensor tenosynovitis.  Currently not on any immunosuppressive agents. Rates her rheumatoid arthritis as a 5/10 today with pain and swelling in her fingers and ankles. She has contractures of the left, third PIP joint and all PIPs on the right on physical exam. No evidence of synovitis on physical exam. Changes likely due to osteoarthritis. No need for immunosuppressive therapy at this time.  A handout on hand exercises was given.  Right hand paresthesia - She is using carpal tunnel brace. Not a candidate for steroid injection due to history of diabetes mellitus. She was advised to see her orthopedic surgeon when she is ready for surgery.  In the meantime she will continue to use carpal tunnel braces.  Trochanteric bursitis, left hip - She has intermittent discomfort. She does the IT band stretches as tolerated.   History of total bilateral knee replacement - 2013 by Dr. Laurance Flatten in [place].  She continues to have chronic discomfort.  She would like to establish with an orthopedic surgeon here locally.  Will refer her to orthopedics.  Other idiopathic scoliosis, thoracic region  DDD (degenerative disc disease), lumbar - Left-sided radiculopathy. Patient states her back is bothering her a lot today. She has difficulty ambulating to the table due to the pain.  Her previous orthopedic  surgeon is not accepting her insurance.  She requested a referral to an orthopedic surgeon in [place].  Will refer to Ortho care.  Other medical concerns are listed as follows:  Primary hypertension - Blood pressure was 99/65 in office today. On losartan 50 mg QD and Toprol-XL 25 mg QD.   Stage 3a chronic kidney disease (HCC)-September 24, 2022 creatinine was 1.43.  History of hyperlipidemia  Type 2 diabetes mellitus with diabetic polyneuropathy, without long-term current use of insulin (HCC)  Moderate nonproliferative diabetic retinopathy of left eye without macular edema associated with type 2 diabetes mellitus ([place])  History of breast cancer  Gastroesophageal reflux disease without esophagitis  Primary open angle glaucoma of both eyes, moderate stage  Orders: Orders Placed This Encounter  Procedures   Ambulatory referral to Orthopedic Surgery   No orders of the defined types were placed in this encounter.  Face-to-face time spent with patient was 30 minutes. Greater than 50% of time was spent in counseling and coordination of care.  Follow-Up Instructions: Return in about 6 months (around 08/05/2023) for Rheumatoid arthritis, Osteoarthritis.   Bo Merino, MD  Note - This record has been created using Editor, commissioning.  Chart creation errors have been sought, but may not always  have been located. Such creation errors do not reflect on  the standard of medical care.

## 2023-01-21 NOTE — Addendum Note (Signed)
Addended by: Anabel Halon on: 01/21/2023 03:20 PM   Modules accepted: Orders

## 2023-01-22 ENCOUNTER — Other Ambulatory Visit: Payer: Self-pay

## 2023-01-22 ENCOUNTER — Other Ambulatory Visit: Payer: Self-pay | Admitting: Medical

## 2023-01-22 DIAGNOSIS — R928 Other abnormal and inconclusive findings on diagnostic imaging of breast: Secondary | ICD-10-CM

## 2023-01-22 MED ORDER — TRULICITY 1.5 MG/0.5ML ~~LOC~~ SOAJ: 1.5000 mg | SUBCUTANEOUS | 3 refills | Status: DC

## 2023-01-26 ENCOUNTER — Other Ambulatory Visit: Payer: Self-pay

## 2023-01-26 MED ORDER — TRULICITY 1.5 MG/0.5ML ~~LOC~~ SOAJ: 1.5000 mg | SUBCUTANEOUS | 0 refills | Status: DC

## 2023-01-26 NOTE — Telephone Encounter (Signed)
[facility] states that they are having in house delays on the Trulicity and will hopefully have shipped out next week to patient.  A 28 day supply was sent to patient pharmacy as requested.

## 2023-02-02 ENCOUNTER — Encounter: Payer: Self-pay | Admitting: Rheumatology

## 2023-02-02 ENCOUNTER — Ambulatory Visit: Payer: PPO | Attending: Rheumatology | Admitting: Rheumatology

## 2023-02-02 VITALS — BP 99/65 | HR 75 | Resp 14 | Ht 61.0 in | Wt 180.0 lb

## 2023-02-02 DIAGNOSIS — M7062 Trochanteric bursitis, left hip: Secondary | ICD-10-CM | POA: Diagnosis not present

## 2023-02-02 DIAGNOSIS — E1142 Type 2 diabetes mellitus with diabetic polyneuropathy: Secondary | ICD-10-CM | POA: Diagnosis not present

## 2023-02-02 DIAGNOSIS — Z8639 Personal history of other endocrine, nutritional and metabolic disease: Secondary | ICD-10-CM

## 2023-02-02 DIAGNOSIS — I10 Essential (primary) hypertension: Secondary | ICD-10-CM

## 2023-02-02 DIAGNOSIS — N1831 Chronic kidney disease, stage 3a: Secondary | ICD-10-CM | POA: Diagnosis not present

## 2023-02-02 DIAGNOSIS — R202 Paresthesia of skin: Secondary | ICD-10-CM

## 2023-02-02 DIAGNOSIS — M4124 Other idiopathic scoliosis, thoracic region: Secondary | ICD-10-CM

## 2023-02-02 DIAGNOSIS — M5136 Other intervertebral disc degeneration, lumbar region: Secondary | ICD-10-CM

## 2023-02-02 DIAGNOSIS — H401132 Primary open-angle glaucoma, bilateral, moderate stage: Secondary | ICD-10-CM

## 2023-02-02 DIAGNOSIS — Z853 Personal history of malignant neoplasm of breast: Secondary | ICD-10-CM | POA: Diagnosis not present

## 2023-02-02 DIAGNOSIS — Z96653 Presence of artificial knee joint, bilateral: Secondary | ICD-10-CM

## 2023-02-02 DIAGNOSIS — M059 Rheumatoid arthritis with rheumatoid factor, unspecified: Secondary | ICD-10-CM | POA: Diagnosis not present

## 2023-02-02 DIAGNOSIS — M51369 Other intervertebral disc degeneration, lumbar region without mention of lumbar back pain or lower extremity pain: Secondary | ICD-10-CM

## 2023-02-02 DIAGNOSIS — E113392 Type 2 diabetes mellitus with moderate nonproliferative diabetic retinopathy without macular edema, left eye: Secondary | ICD-10-CM | POA: Diagnosis not present

## 2023-02-02 DIAGNOSIS — K219 Gastro-esophageal reflux disease without esophagitis: Secondary | ICD-10-CM

## 2023-02-02 NOTE — Patient Instructions (Signed)
Hand Exercises Hand exercises can be helpful for almost anyone. These exercises can strengthen the hands, improve flexibility and movement, and increase blood flow to the hands. These results can make work and daily tasks easier. Hand exercises can be especially helpful for people who have joint pain from arthritis or have nerve damage from overuse (carpal tunnel syndrome). These exercises can also help people who have injured a hand. Exercises Most of these hand exercises are gentle stretching and motion exercises. It is usually safe to do them often throughout the day. Warming up your hands before exercise may help to reduce stiffness. You can do this with gentle massage or by placing your hands in warm water for 10-15 minutes. It is normal to feel some stretching, pulling, tightness, or mild discomfort as you begin new exercises. This will gradually improve. Stop an exercise right away if you feel sudden, severe pain or your pain gets worse. Ask your health care provider which exercises are best for you. Knuckle bend or "claw" fist  Stand or sit with your arm, hand, and all five fingers pointed straight up. Make sure to keep your wrist straight during the exercise. Gently bend your fingers down toward your palm until the tips of your fingers are touching the top of your palm. Keep your big knuckle straight and just bend the small knuckles in your fingers. Hold this position for __________ seconds. Straighten (extend) your fingers back to the starting position. Repeat this exercise 5-10 times with each hand. Full finger fist  Stand or sit with your arm, hand, and all five fingers pointed straight up. Make sure to keep your wrist straight during the exercise. Gently bend your fingers into your palm until the tips of your fingers are touching the middle of your palm. Hold this position for __________ seconds. Extend your fingers back to the starting position, stretching every joint fully. Repeat  this exercise 5-10 times with each hand. Straight fist Stand or sit with your arm, hand, and all five fingers pointed straight up. Make sure to keep your wrist straight during the exercise. Gently bend your fingers at the big knuckle, where your fingers meet your hand, and the middle knuckle. Keep the knuckle at the tips of your fingers straight and try to touch the bottom of your palm. Hold this position for __________ seconds. Extend your fingers back to the starting position, stretching every joint fully. Repeat this exercise 5-10 times with each hand. Tabletop  Stand or sit with your arm, hand, and all five fingers pointed straight up. Make sure to keep your wrist straight during the exercise. Gently bend your fingers at the big knuckle, where your fingers meet your hand, as far down as you can while keeping the small knuckles in your fingers straight. Think of forming a tabletop with your fingers. Hold this position for __________ seconds. Extend your fingers back to the starting position, stretching every joint fully. Repeat this exercise 5-10 times with each hand. Finger spread  Place your hand flat on a table with your palm facing down. Make sure your wrist stays straight as you do this exercise. Spread your fingers and thumb apart from each other as far as you can until you feel a gentle stretch. Hold this position for __________ seconds. Bring your fingers and thumb tight together again. Hold this position for __________ seconds. Repeat this exercise 5-10 times with each hand. Making circles  Stand or sit with your arm, hand, and all five fingers pointed   straight up. Make sure to keep your wrist straight during the exercise. Make a circle by touching the tip of your thumb to the tip of your index finger. Hold for __________ seconds. Then open your hand wide. Repeat this motion with your thumb and each finger on your hand. Repeat this exercise 5-10 times with each hand. Thumb  motion  Sit with your forearm resting on a table and your wrist straight. Your thumb should be facing up toward the ceiling. Keep your fingers relaxed as you move your thumb. Lift your thumb up as high as you can toward the ceiling. Hold for __________ seconds. Bend your thumb across your palm as far as you can, reaching the tip of your thumb for the small finger (pinkie) side of your palm. Hold for __________ seconds. Repeat this exercise 5-10 times with each hand. Grip strengthening  Hold a stress ball or other soft ball in the middle of your hand. Slowly increase the pressure, squeezing the ball as much as you can without causing pain. Think of bringing the tips of your fingers into the middle of your palm. All of your finger joints should bend when doing this exercise. Hold your squeeze for __________ seconds, then relax. Repeat this exercise 5-10 times with each hand. Contact a health care provider if: Your hand pain or discomfort gets much worse when you do an exercise. Your hand pain or discomfort does not improve within 2 hours after you exercise. If you have any of these problems, stop doing these exercises right away. Do not do them again unless your health care provider says that you can. Get help right away if: You develop sudden, severe hand pain or swelling. If this happens, stop doing these exercises right away. Do not do them again unless your health care provider says that you can. This information is not intended to replace advice given to you by your health care provider. Make sure you discuss any questions you have with your health care provider. Document Revised: 02/27/2021 Document Reviewed: 03/06/2021 Elsevier Patient Education  2023 Elsevier Inc.  

## 2023-02-05 ENCOUNTER — Ambulatory Visit
Admission: RE | Admit: 2023-02-05 | Discharge: 2023-02-05 | Disposition: A | Discharge status: Home / Self Care | Payer: PPO | Source: Ambulatory Visit | Attending: Medical | Admitting: Medical

## 2023-02-05 ENCOUNTER — Ambulatory Visit: Payer: PPO

## 2023-02-05 DIAGNOSIS — R921 Mammographic calcification found on diagnostic imaging of breast: Secondary | ICD-10-CM | POA: Diagnosis not present

## 2023-02-05 DIAGNOSIS — R928 Other abnormal and inconclusive findings on diagnostic imaging of breast: Secondary | ICD-10-CM

## 2023-02-08 ENCOUNTER — Other Ambulatory Visit: Payer: Self-pay | Admitting: Medical

## 2023-02-08 DIAGNOSIS — R921 Mammographic calcification found on diagnostic imaging of breast: Secondary | ICD-10-CM

## 2023-02-15 ENCOUNTER — Encounter (INDEPENDENT_AMBULATORY_CARE_PROVIDER_SITE_OTHER): Payer: HMO | Admitting: Ophthalmology

## 2023-02-15 DIAGNOSIS — H43813 Vitreous degeneration, bilateral: Secondary | ICD-10-CM | POA: Diagnosis not present

## 2023-02-15 DIAGNOSIS — E113393 Type 2 diabetes mellitus with moderate nonproliferative diabetic retinopathy without macular edema, bilateral: Secondary | ICD-10-CM | POA: Diagnosis not present

## 2023-02-15 DIAGNOSIS — H35351 Cystoid macular degeneration, right eye: Secondary | ICD-10-CM | POA: Diagnosis not present

## 2023-02-16 ENCOUNTER — Ambulatory Visit
Admission: RE | Admit: 2023-02-16 | Discharge: 2023-02-16 | Disposition: A | Discharge status: Home / Self Care | Payer: PPO | Source: Ambulatory Visit | Attending: Medical | Admitting: Medical

## 2023-02-16 DIAGNOSIS — R921 Mammographic calcification found on diagnostic imaging of breast: Secondary | ICD-10-CM | POA: Diagnosis not present

## 2023-02-16 HISTORY — PX: BREAST BIOPSY: SHX20

## 2023-02-22 ENCOUNTER — Other Ambulatory Visit (INDEPENDENT_AMBULATORY_CARE_PROVIDER_SITE_OTHER): Payer: PPO

## 2023-02-22 ENCOUNTER — Ambulatory Visit (INDEPENDENT_AMBULATORY_CARE_PROVIDER_SITE_OTHER): Payer: PPO | Admitting: Orthopedic Surgery

## 2023-02-22 ENCOUNTER — Encounter: Payer: Self-pay | Admitting: Orthopedic Surgery

## 2023-02-22 VITALS — BP 125/77 | HR 79 | Ht 61.0 in | Wt 180.0 lb

## 2023-02-22 DIAGNOSIS — M25562 Pain in left knee: Secondary | ICD-10-CM

## 2023-02-22 DIAGNOSIS — M25561 Pain in right knee: Secondary | ICD-10-CM | POA: Diagnosis not present

## 2023-02-22 DIAGNOSIS — M5416 Radiculopathy, lumbar region: Secondary | ICD-10-CM

## 2023-02-22 NOTE — Progress Notes (Signed)
Orthopedic Spine Surgery Office Note  Assessment: Patient is a 82 y.o. female with chronic low back pain with left-sided radiculopathy.  Bilateral knee pain status post TKA   Plan: -Patient states she is not interested in any operative management at this time but was looking for more information about her back and knees.  I explained that her nonoperative treatment options include physical therapy, Tylenol, NSAIDs, gabapentin, pain management.  She is currently on gabapentin and has tried physical therapy without relief.  She has chronic kidney disease and is not supposed to take NSAIDs.  I told her she can take Tylenol and I could refer her to pain management.  She is not interested in pain management at this time but was going to think about it.  She stated she would call my office if she elects to go that route and I will put in referral for her -Patient should return to office on an as-needed basis   Patient expressed understanding of the plan and all questions were answered to the patient's satisfaction.   ___________________________________________________________________________   History:  Patient is a 82 y.o. female who presents today for lumbar spine and bilateral knees.  Patient has had several years of low back pain that radiates into her left lower extremity.  She feels that starts in the low back and goes down the lateral aspect of her left thigh and left leg.  It goes all the way down to her ankle.  She has no symptoms radiating down the right lower extremity.  Pain feels better if she lays down and is generally worse at the end of the day in her low back.  She has been told in the past that she has spinal stenosis and has undergone injections, PT, over-the-counter medications, gabapentin.  She said that at 1 point she talk to a surgeon about possible decompressive surgery but felt that the outcomes presented were not good or the chances of success were not good and so elected to  continue with nonoperative treatment.  Denies saddle anesthesia.  Denies bowel or bladder incontinence.  Denies paresthesias and numbness.  In regards to her knees, she had total knees done in 2013 by Dr. Laurance Flatten in [place].  She said she got about 2 good years out of the knees and then started having knee pain bilaterally.  She feels it over the patella.  There is no trauma or injury that brought on the pain.  She had seen her surgeon afterwards and was told to do more PT.  She had tried that and over-the-counter's without much relief.  She has not noticed any redness or drainage around her knees.  She is able ambulate with a cane.  Pain is not acutely worsened since she has seen her surgeon.  Treatments tried: PT, NSAIDs, Tylenol, steroid injections, gabapentin  Review of systems: Denies fevers and chills, night sweats, unexplained weight loss, history of cancer.  Has had pain that wakes her at night  Past medical history: Breast cancer Hyperlipidemia Hypertension Chronic kidney disease GERD Osteoporosis Neuropathy Diabetes Rheumatoid arthritis Chronic pain  Allergies: Sulfa, penicillins, amlodipine, lisinopril  Past surgical history:  Hysterectomy Right mastectomy Bilateral TKA Tubal ligation  Social history: Denies use of nicotine product (smoking, vaping, patches, smokeless) Alcohol use: Denies Denies recreational drug use   Physical Exam:  General: no acute distress, appears stated age Neurologic: alert, answering questions appropriately, following commands Respiratory: unlabored breathing on room air, symmetric chest rise Psychiatric: appropriate affect, normal cadence to speech  MSK (spine):  -Strength exam      Left  Right EHL    5/5  5/5 TA    5/5  5/5 GSC    5/5  5/5 Knee extension  5/5  5/5 Hip flexion   5/5  5/5  -Sensory exam    Sensation intact to light touch in L3-S1 nerve distributions of bilateral lower extremities  -Achilles DTR: 1/4 on  the left, 1/4 on the right -Patellar tendon DTR: 1/4 on the left, 1/4 on the right  -Straight leg raise: negative -Contralateral straight leg raise: negative -Femoral nerve stretch test: negative bilaterally -Clonus: no beats bilaterally  -Left knee exam: Knee range of motion from 3 to 110 degrees, no pain through range of motion, knee stable to varus and valgus stress, knee stable to anterior posterior drawer, no sinus tract seen, incision well-healed with no evidence of infection -Right knee exam: Knee range of motion from 5 to 100 degrees, no pain through range of motion, knee stable to varus valgus stress, knee stable to anterior posterior drawer, no sinus tract, incision well-healed without evidence of infection  Imaging: XR of the lumbar spine from 02/22/2023 was independently reviewed and interpreted, showing degenerative scoliosis with coronal imbalance.  Facet arthropathy at multiple levels.  Degenerative disc disease at multiple levels in the lumbar spine.  Loss of lumbar lordosis.  XR of the bilateral knees from 02/22/2023 was independently reviewed and interpreted, showing total knee arthroplasties in appropriate position with no lucency around the implants.  No fracture seen.  Slight asymmetric wear of the poly on the medial aspect.   Patient name: Donna Fuller Patient MRN: [id number] Date of visit: 02/22/23

## 2023-03-03 ENCOUNTER — Other Ambulatory Visit: Payer: Self-pay | Admitting: Physician Assistant

## 2023-03-11 ENCOUNTER — Other Ambulatory Visit: Payer: Self-pay | Admitting: Medical

## 2023-03-11 ENCOUNTER — Other Ambulatory Visit: Payer: Self-pay | Admitting: Physician Assistant

## 2023-03-11 ENCOUNTER — Other Ambulatory Visit: Payer: Self-pay | Admitting: Internal Medicine

## 2023-03-11 DIAGNOSIS — E1142 Type 2 diabetes mellitus with diabetic polyneuropathy: Secondary | ICD-10-CM

## 2023-03-15 ENCOUNTER — Encounter: Payer: Self-pay | Admitting: *Deleted

## 2023-03-17 ENCOUNTER — Other Ambulatory Visit (HOSPITAL_COMMUNITY): Payer: Self-pay

## 2023-03-17 ENCOUNTER — Telehealth: Payer: Self-pay

## 2023-03-17 NOTE — Telephone Encounter (Signed)
PA request received via CMM for Trulicity 1.5MG /0.5ML pen-injectors  PA has been submitted to [employer] and is pending additional questions/determination  Key: [id number]

## 2023-03-17 NOTE — Telephone Encounter (Signed)
PA has been APPROVED from 03/17/2023-03/16/2024

## 2023-03-30 DIAGNOSIS — E113393 Type 2 diabetes mellitus with moderate nonproliferative diabetic retinopathy without macular edema, bilateral: Secondary | ICD-10-CM | POA: Diagnosis not present

## 2023-03-30 DIAGNOSIS — H04123 Dry eye syndrome of bilateral lacrimal glands: Secondary | ICD-10-CM | POA: Diagnosis not present

## 2023-03-30 DIAGNOSIS — Z961 Presence of intraocular lens: Secondary | ICD-10-CM | POA: Diagnosis not present

## 2023-03-30 DIAGNOSIS — H401122 Primary open-angle glaucoma, left eye, moderate stage: Secondary | ICD-10-CM | POA: Diagnosis not present

## 2023-03-30 LAB — HM DIABETES EYE EXAM

## 2023-04-01 ENCOUNTER — Encounter: Payer: Self-pay | Admitting: Internal Medicine

## 2023-04-05 ENCOUNTER — Encounter: Payer: Self-pay | Admitting: Medical

## 2023-04-05 ENCOUNTER — Ambulatory Visit (INDEPENDENT_AMBULATORY_CARE_PROVIDER_SITE_OTHER): Payer: PPO | Admitting: Medical

## 2023-04-05 VITALS — BP 128/50 | HR 76 | Ht 61.0 in | Wt 187.2 lb

## 2023-04-05 DIAGNOSIS — I2510 Atherosclerotic heart disease of native coronary artery without angina pectoris: Secondary | ICD-10-CM

## 2023-04-05 DIAGNOSIS — R300 Dysuria: Secondary | ICD-10-CM | POA: Diagnosis not present

## 2023-04-05 DIAGNOSIS — D170 Benign lipomatous neoplasm of skin and subcutaneous tissue of head, face and neck: Secondary | ICD-10-CM

## 2023-04-05 DIAGNOSIS — R10819 Abdominal tenderness, unspecified site: Secondary | ICD-10-CM

## 2023-04-05 DIAGNOSIS — Z23 Encounter for immunization: Secondary | ICD-10-CM | POA: Diagnosis not present

## 2023-04-05 DIAGNOSIS — Z8679 Personal history of other diseases of the circulatory system: Secondary | ICD-10-CM | POA: Diagnosis not present

## 2023-04-05 DIAGNOSIS — T148XXA Other injury of unspecified body region, initial encounter: Secondary | ICD-10-CM | POA: Diagnosis not present

## 2023-04-05 LAB — POCT URINALYSIS DIPSTICK
Bilirubin, UA: NEGATIVE
Blood, UA: NEGATIVE
Glucose, UA: NEGATIVE
Ketones, UA: NEGATIVE
Leukocytes, UA: NEGATIVE
Nitrite, UA: NEGATIVE
Protein, UA: NEGATIVE
Spec Grav, UA: <=1.005 — AB (ref 1.010–1.025)
Urobilinogen, UA: 0.2 E.U./dL
pH, UA: 5 (ref 5.0–8.0)

## 2023-04-05 NOTE — Patient Instructions (Addendum)
1. Dysuria Urine dip negative but urine culture - POCT Urinalysis Dipstick  2. Suprapubic tenderness Hydrate well and can use azostandard if pain comes back. If culture + will rx antibiotic.  3. Lipoma of head Discussed lipoma liklely. - Ambulatory referral to General Surgery  4. Puncture wound Td. Offered xray but declined. If area gets more painful get xray to rule out small piece of needle from piece that punctured.  5. History of angina with htn and high cholesterol Continue losartan and pravastatin - Ambulatory referral to Cardiology  6. Coronary artery disease involving native coronary artery of native heart, unspecified whether angina present - Ambulatory referral to Cardiology   Follow up date  3 weeks or sooner if needed.

## 2023-04-05 NOTE — Progress Notes (Signed)
Subjective:    Patient ID: Donna Fuller, female    DOB: November 01, 1941, 82 y.o.   MRN: [id number]  HPI  Pt in for concern that she may have uti per her report. Pt thought her urine had stong odor and some suprapubic pain with some dysuria last monday. Pt has hx of uti in past but states years since any infection.  Pt started to hydrate ad states dysuria resolved.  Pt states recently stepped on pin/needle  in her house recently. This was on left foot Thursday or Friday. Hurt only briefly. She soaked her foot in epson salt and water and washed the foot. Pt is diabetic. Her last A1c was 6.8. pt is on Trulicity 1.5 mg weekly injection. Also on glipiized 5 mg daily. Pt sees Dr. Lonzo Cloud. Pt told to follow up in 6 months.   Pt also notes on her forehead hairline felt small bump over the years but now has grown. She stats over past 6 months has gotten larger.   Offered/recommended Tetanus.  Pt wants to get new cardiologist. Formerly saw [facility] cardiology but not satisfied. No chest pain presently no acute dypsnea or other type symptoms.    High cholesterol- on pravastatin. Htn- on losartan.   Review of Systems  Constitutional:  Negative for chills, fatigue and fever.  HENT:  Negative for dental problem.   Respiratory:  Negative for cough, chest tightness and wheezing.   Cardiovascular:  Negative for chest pain and palpitations.  Gastrointestinal:  Negative for abdominal pain.  Genitourinary:  Positive for dysuria. Negative for decreased urine volume, difficulty urinating, hematuria and pelvic pain.       See hpi.  Musculoskeletal:  Negative for back pain, myalgias and neck pain.  Skin:  Negative for rash.  Neurological:  Negative for dizziness, speech difficulty, weakness and light-headedness.  Hematological:  Negative for adenopathy. Does not bruise/bleed easily.  Psychiatric/Behavioral:  Negative for behavioral problems, confusion and decreased concentration.     Past Medical  History:  Diagnosis Date   Arthritis    Breast cancer (HCC) 2007   Right breast, s/p mastectomy   Diabetes mellitus without complication (HCC)    GERD (gastroesophageal reflux disease)    Gout    Hyperlipidemia    Hypertension    Spinal stenosis      Social History   Socioeconomic History   Marital status: Married    Spouse name: Jimmy   Number of children: 3   Years of education: 12   Highest education level: Not on file  Occupational History   Not on file  Tobacco Use   Smoking status: Former    Years: 5    Types: Cigarettes    Passive exposure: Never   Smokeless tobacco: Never  Vaping Use   Vaping Use: Never used  Substance and Sexual Activity   Alcohol use: No   Drug use: No   Sexual activity: Not Currently  Other Topics Concern   Not on file  Social History Narrative   Marital Status:  Married Hydrologist)    Children:  Daughter(1) Son (1)    Pets:  None    Living Situation: Lives with husband and daughter.     Occupation:  Retired Contractor)    Education: 12th Grade    Tobacco Use/Exposure:  She used to smoke socially during the weekends but quit 40 years ago.    Alcohol Use:  Occasional   Drug Use:  None   Diet:  Regular   Exercise:  None   Hobbies:  Reading and playing volleyball             Social Determinants of Health   Financial Resource Strain: Low Risk  (10/01/2021)   Overall Financial Resource Strain (CARDIA)    Difficulty of Paying Living Expenses: Not hard at all  Food Insecurity: No Food Insecurity (10/01/2021)   Hunger Vital Sign    Worried About Running Out of Food in the Last Year: Never true    Ran Out of Food in the Last Year: Never true  Transportation Needs: No Transportation Needs (10/01/2021)   PRAPARE - Administrator, Civil Service (Medical): No    Lack of Transportation (Non-Medical): No  Physical Activity: Inactive (10/01/2021)   Exercise Vital Sign    Days of Exercise per Week: 0 days    Minutes of Exercise per  Session: 0 min  Stress: No Stress Concern Present (10/01/2021)   [employer] of Occupational Health - Occupational Stress Questionnaire    Feeling of Stress : Not at all  Social Connections: Moderately Integrated (10/01/2021)   Social Connection and Isolation Panel [NHANES]    Frequency of Communication with Friends and Family: More than three times a week    Frequency of Social Gatherings with Friends and Family: More than three times a week    Attends Religious Services: More than 4 times per year    Active Member of [employer] or Organizations: No    Attends Banker Meetings: Never    Marital Status: Married  Catering manager Violence: Not At Risk (10/01/2021)   Humiliation, Afraid, Rape, and Kick questionnaire    Fear of Current or Ex-Partner: No    Emotionally Abused: No    Physically Abused: No    Sexually Abused: No    Past Surgical History:  Procedure Laterality Date   ABDOMINAL HYSTERECTOMY     ABDOMINAL SURGERY     BREAST BIOPSY Left    2016 benign    BREAST BIOPSY Left 02/16/2023   MM LT BREAST BX W LOC DEV 1ST LESION IMAGE BX SPEC STEREO GUIDE 02/16/2023 [facility]   LEFT HEART CATH AND CORONARY ANGIOGRAPHY N/A 11/21/2021   Procedure: LEFT HEART CATH AND CORONARY ANGIOGRAPHY;  Surgeon: Iran Ouch, MD;  Location: [facility] INVASIVE CV LAB;  Service: Cardiovascular;  Laterality: N/A;   MASTECTOMY     right side   REPLACEMENT TOTAL KNEE Bilateral    TUBAL LIGATION      Family History  Problem Relation Age of Onset   Diabetes Mother    Alzheimer's disease Mother    Hypertension Mother    Hypertension Father    Heart disease Father    Hyperlipidemia Father    Kidney disease Father    Diabetes Sister    Cancer Brother        lung   Alzheimer's disease Brother    Heart disease Brother    Hypertension Brother    Diabetes Brother    Hypertension Brother    Diabetes Brother    Hyperlipidemia Son    Hypertension Son    Diabetes Son     Hypertension Daughter    Hyperlipidemia Daughter     Allergies  Allergen Reactions   Penicillins Swelling   Sulfa Antibiotics Swelling   Amlodipine Swelling    LE Swelling   Lisinopril Cough    Current Outpatient Medications on File Prior to Visit  Medication Sig Dispense Refill   acetaminophen (TYLENOL) 650 MG CR  tablet Take 650 mg by mouth 2 (two) times daily.     albuterol (VENTOLIN HFA) 108 (90 Base) MCG/ACT inhaler INHALE 2 PUFFS BY MOUTH EVERY 6 HOURS AS NEEDED 8.5 each 2   ascorbic acid (VITAMIN C) 500 MG tablet Take 500 mg by mouth daily.     aspirin 81 MG tablet Take 81 mg by mouth daily.     Biotin 1000 MCG CHEW Chew by mouth.     blood glucose meter kit and supplies KIT Dispense based on patient and insurance preference. Use up to four times daily as directed. (FOR ICD-9 250.00, 250.01). 1 each 11   brimonidine (ALPHAGAN) 0.2 % ophthalmic solution Place 1 drop into both eyes 2 (two) times a day.     Cholecalciferol (VITAMIN D-3) 5000 units TABS Take 1 tablet by mouth daily.     dicyclomine (BENTYL) 10 MG capsule Take 1 capsule (10 mg total) by mouth 2 (two) times daily as needed for spasms. 14 capsule 0   Dulaglutide (TRULICITY) 1.5 MG/0.5ML SOPN Inject 1.5 mg into the skin once a week. 2 mL 0   DULoxetine (CYMBALTA) 30 MG capsule TAKE 1 CAPSULE BY MOUTH EVERY DAY 90 capsule 2   famotidine (PEPCID) 20 MG tablet TAKE 1 TABLET BY MOUTH EVERY DAY 90 tablet 1   fluticasone (FLONASE) 50 MCG/ACT nasal spray SPRAY 2 SPRAYS INTO EACH NOSTRIL EVERY DAY 48 mL 1   gabapentin (NEURONTIN) 300 MG capsule TAKE 1 CAPSULE BY MOUTH THREE TIMES A DAY 90 capsule 2   glipiZIDE (GLUCOTROL) 5 MG tablet TAKE 1 TABLET BY MOUTH DAILY BEFORE BREAKFAST AND 1/2 TABLET DAILY BEFORE SUPPER. 135 tablet 3   ketorolac (ACULAR) 0.5 % ophthalmic solution INSTILL 1 DROP INTO RIGHT EYE 4 TIMES A DAY 5 mL 6   latanoprost (XALATAN) 0.005 % ophthalmic solution Place 1 drop into both eyes daily.     levocetirizine  (XYZAL) 5 MG tablet Take 1 tablet (5 mg total) by mouth every evening. 30 tablet 3   losartan (COZAAR) 50 MG tablet Take 1 tablet (50 mg total) by mouth daily. 90 tablet 3   metoprolol succinate (TOPROL-XL) 25 MG 24 hr tablet Take 1 tablet (25 mg total) by mouth daily. 90 tablet 3   montelukast (SINGULAIR) 10 MG tablet Take 10 mg by mouth at bedtime.     omeprazole (PRILOSEC) 40 MG capsule TAKE 1 CAPSULE BY MOUTH TWICE A DAY 60 capsule 0   OneTouch Delica Lancets 33G MISC Use as instructed to check blood sugar 2-3 times a day.  DX E11.9 100 each 3   ONETOUCH ULTRA test strip CHECK BLOOD SUGAR 2 TO 3 TIMES DAILY AS DIRECTED 100 strip 5   pravastatin (PRAVACHOL) 20 MG tablet TAKE 1 TABLET BY MOUTH ONCE DAILY (Patient taking differently: Take 20 mg by mouth daily.) 90 tablet 0   sucralfate (CARAFATE) 1 g tablet TAKE 1 TABLET (1 G TOTAL) BY MOUTH 4 TIMES A DAY WITH MEALS AND AT BEDTIME 90 tablet 0   timolol (TIMOPTIC) 0.5 % ophthalmic solution Place 1 drop into both eyes 2 (two) times a day.     torsemide (DEMADEX) 5 MG tablet Take 5 mg by mouth daily.     No current facility-administered medications on file prior to visit.    BP (!) 128/50 (BP Location: Right Arm, Patient Position: Sitting, Cuff Size: Large)   Pulse 76   Ht 5\' 1"  (1.549 m)   Wt 187 lb 3.2 oz (84.9 kg)  SpO2 100%   BMI 35.37 kg/m        Objective:   Physical Exam  General Mental Status- Alert. General Appearance- Not in acute distress.   Skin General: Color- Normal Color. Moisture- Normal Moisture.  Neck Carotid Arteries- Normal color. Moisture- Normal Moisture. No carotid bruits. No JVD.  Chest and Lung Exam Auscultation: Breath Sounds:-Normal.  Cardiovascular Auscultation:Rythm- Regular. Murmurs & Other Heart Sounds:Auscultation of the heart reveals- No Murmurs.  Abdomen Inspection:-Inspeection Normal. Palpation/Percussion:Note:No mass. Palpation and Percussion of the abdomen reveal- Non Tender, Non  Distended + BS, no rebound or guarding.   Neurologic Cranial Nerve exam:- CN III-XII intact(No nystagmus), symmetric smile. Strength:- 5/5 equal and symmetric strength both upper and lower extremities.   Left foot- on inspection. No swelling. No redness. Small pin point bruise between distal 4th and 5th metatarsal     Assessment & Plan:   Patient Instructions  1. Dysuria Urine dip negative but urine culture - POCT Urinalysis Dipstick  2. Suprapubic tenderness Hydrate well and can use azostandard if pain comes back.   3. Lipoma of head Discussed lipoma liklely. - Ambulatory referral to General Surgery  4. Puncture wound Td. Offered xray but declined. If area gets more painful get xray to rule out small piece of needle from piece that punctured.  5. History of angina with htn and high cholesterol Continue losartan and pravastatin - Ambulatory referral to Cardiology  6. Coronary artery disease involving native coronary artery of native heart, unspecified whether angina present - Ambulatory referral to Cardiology   Follow up date  3 weeks or sooner if needed.

## 2023-04-05 NOTE — Addendum Note (Signed)
Addended by: Judieth Keens on: 04/05/2023 05:18 PM   Modules accepted: Orders

## 2023-04-06 DIAGNOSIS — R300 Dysuria: Secondary | ICD-10-CM | POA: Diagnosis not present

## 2023-04-06 DIAGNOSIS — R10819 Abdominal tenderness, unspecified site: Secondary | ICD-10-CM | POA: Diagnosis not present

## 2023-04-07 LAB — URINE CULTURE
MICRO NUMBER:: 14923619
Result:: NO GROWTH
SPECIMEN QUALITY:: ADEQUATE

## 2023-04-19 NOTE — Progress Notes (Unsigned)
Name: Donna Fuller  Age/ Sex: 82 y.o., female   MRN/ DOB: [id number], May 31, 1941     PCP: Marisue Brooklyn   Reason for Endocrinology Evaluation: Type 2 Diabetes Mellitus  Initial Endocrine Consultative Visit: 12/14/2019    PATIENT IDENTIFIER: Donna Fuller is a 82 y.o. female with a past medical history of T2DM, HTN, RA and Dyslipidemia. The patient has followed with Endocrinology clinic since 12/14/2019 for consultative assistance with management of her diabetes.  DIABETIC HISTORY:  Donna Fuller was diagnosed with T2DM many years ago. She was on metformin due to low renal function, has been on Januvia, Farxia and insulin as well. Her hemoglobin A1c has ranged from 6.0 %in 2015, peaking at 8.8% in 2020.  On her initial visit to our clinic her A1c was 8.8%   , she was on Trulicity and pioglitazone, we added glipizide   Stopped Pioglitazone 03/2022 due to A1c 5.7 %   SUBJECTIVE:   During the last visit (10/19/2022): A1c 6.8%     Today (04/20/2023): Donna Fuller is here for follow-up on diabetes management. She checks her blood sugars 2 times daily, preprandial to breakfast and dinnertime.  The patient has not had hypoglycemic episodes since the last clinic visit.    She follows with Dr. Titus Dubin for for seropositive rheumatoid arthritis Patient follows with orthopedics for arthralgias  NO nausea or diarrhea   HOME DIABETES REGIMEN:  Glipizide 5 mg , BID Trulicity 1.5 mg weekly- pt assistance     METER DOWNLOAD SUMMARY:  110- 229  mg/dL     DIABETIC COMPLICATIONS: Microvascular complications:  Neuropathy  Denies: retinopathy , CKD Last eye exam: Completed 03/30/2023 Macrovascular complications:    Denies: CAD, PVD, CVA     HISTORY:  Past Medical History:  Past Medical History:  Diagnosis Date   Arthritis    Breast cancer (HCC) 2007   Right breast, s/p mastectomy   Diabetes mellitus without complication (HCC)    GERD (gastroesophageal  reflux disease)    Gout    Hyperlipidemia    Hypertension    Spinal stenosis    Past Surgical History:  Past Surgical History:  Procedure Laterality Date   ABDOMINAL HYSTERECTOMY     ABDOMINAL SURGERY     BREAST BIOPSY Left    2016 benign    BREAST BIOPSY Left 02/16/2023   MM LT BREAST BX W LOC DEV 1ST LESION IMAGE BX SPEC STEREO GUIDE 02/16/2023 [facility]   LEFT HEART CATH AND CORONARY ANGIOGRAPHY N/A 11/21/2021   Procedure: LEFT HEART CATH AND CORONARY ANGIOGRAPHY;  Surgeon: Iran Ouch, MD;  Location: [facility] INVASIVE CV LAB;  Service: Cardiovascular;  Laterality: N/A;   MASTECTOMY     right side   REPLACEMENT TOTAL KNEE Bilateral    TUBAL LIGATION     Social History:  reports that she has quit smoking. Her smoking use included cigarettes. She has never been exposed to tobacco smoke. She has never used smokeless tobacco. She reports that she does not drink alcohol and does not use drugs. Family History:  Family History  Problem Relation Age of Onset   Diabetes Mother    Alzheimer's disease Mother    Hypertension Mother    Hypertension Father    Heart disease Father    Hyperlipidemia Father    Kidney disease Father    Diabetes Sister    Cancer Brother        lung   Alzheimer's disease Brother    Heart  disease Brother    Hypertension Brother    Diabetes Brother    Hypertension Brother    Diabetes Brother    Hyperlipidemia Son    Hypertension Son    Diabetes Son    Hypertension Daughter    Hyperlipidemia Daughter      HOME MEDICATIONS: Allergies as of 04/20/2023       Reactions   Penicillins Swelling   Sulfa Antibiotics Swelling   Amlodipine Swelling   LE Swelling   Lisinopril Cough        Medication List        Accurate as of Apr 20, 2023 10:47 AM. If you have any questions, ask your nurse or doctor.          acetaminophen 650 MG CR tablet Commonly known as: TYLENOL Take 650 mg by mouth 2 (two) times daily.   albuterol 108 (90  Base) MCG/ACT inhaler Commonly known as: VENTOLIN HFA INHALE 2 PUFFS BY MOUTH EVERY 6 HOURS AS NEEDED   ascorbic acid 500 MG tablet Commonly known as: VITAMIN C Take 500 mg by mouth daily.   aspirin 81 MG tablet Take 81 mg by mouth daily.   Biotin 1000 MCG Chew Chew by mouth.   blood glucose meter kit and supplies Kit Dispense based on patient and insurance preference. Use up to four times daily as directed. (FOR ICD-9 250.00, 250.01).   brimonidine 0.2 % ophthalmic solution Commonly known as: ALPHAGAN Place 1 drop into both eyes 2 (two) times a day.   dicyclomine 10 MG capsule Commonly known as: BENTYL Take 1 capsule (10 mg total) by mouth 2 (two) times daily as needed for spasms.   DULoxetine 30 MG capsule Commonly known as: CYMBALTA TAKE 1 CAPSULE BY MOUTH EVERY DAY   famotidine 20 MG tablet Commonly known as: PEPCID TAKE 1 TABLET BY MOUTH EVERY DAY   fluticasone 50 MCG/ACT nasal spray Commonly known as: FLONASE SPRAY 2 SPRAYS INTO EACH NOSTRIL EVERY DAY   gabapentin 300 MG capsule Commonly known as: NEURONTIN TAKE 1 CAPSULE BY MOUTH THREE TIMES A DAY   glipiZIDE 5 MG tablet Commonly known as: GLUCOTROL TAKE 1 TABLET BY MOUTH DAILY BEFORE BREAKFAST AND 1/2 TABLET DAILY BEFORE SUPPER. What changed: See the new instructions.   ketorolac 0.5 % ophthalmic solution Commonly known as: ACULAR INSTILL 1 DROP INTO RIGHT EYE 4 TIMES A DAY   latanoprost 0.005 % ophthalmic solution Commonly known as: XALATAN Place 1 drop into both eyes daily.   levocetirizine 5 MG tablet Commonly known as: XYZAL Take 1 tablet (5 mg total) by mouth every evening.   losartan 50 MG tablet Commonly known as: COZAAR Take 1 tablet (50 mg total) by mouth daily.   metoprolol succinate 25 MG 24 hr tablet Commonly known as: TOPROL-XL Take 1 tablet (25 mg total) by mouth daily.   montelukast 10 MG tablet Commonly known as: SINGULAIR Take 10 mg by mouth at bedtime.   omeprazole 40 MG  capsule Commonly known as: PRILOSEC TAKE 1 CAPSULE BY MOUTH TWICE A DAY   OneTouch Delica Lancets 33G Misc Use as instructed to check blood sugar 2-3 times a day.  DX E11.9   OneTouch Ultra test strip Generic drug: glucose blood CHECK BLOOD SUGAR 2 TO 3 TIMES DAILY AS DIRECTED   pravastatin 20 MG tablet Commonly known as: PRAVACHOL TAKE 1 TABLET BY MOUTH ONCE DAILY   sucralfate 1 g tablet Commonly known as: CARAFATE TAKE 1 TABLET (1 G TOTAL) BY MOUTH  4 TIMES A DAY WITH MEALS AND AT BEDTIME   timolol 0.5 % ophthalmic solution Commonly known as: TIMOPTIC Place 1 drop into both eyes 2 (two) times a day.   torsemide 5 MG tablet Commonly known as: DEMADEX Take 5 mg by mouth daily.   Trulicity 1.5 MG/0.5ML Sopn Generic drug: Dulaglutide Inject 1.5 mg into the skin once a week.   Vitamin D-3 125 MCG (5000 UT) Tabs Take 1 tablet by mouth daily.         OBJECTIVE:   Vital Signs: BP 122/74 (BP Location: Left Arm, Patient Position: Sitting, Cuff Size: Large)   Pulse 72   Ht 5\' 1"  (1.549 m)   Wt 186 lb (84.4 kg)   SpO2 99%   BMI 35.14 kg/m   Wt Readings from Last 3 Encounters:  04/20/23 186 lb (84.4 kg)  04/05/23 187 lb 3.2 oz (84.9 kg)  02/22/23 180 lb (81.6 kg)     Exam: General: Pt appears well and is in NAD  Lungs: Clear with good BS bilat with no rales, rhonchi, or wheezes  Heart: RRR w  Extremities: No  pretibial edema.  Neuro: MS is good with appropriate affect, pt is alert and Ox3    DM foot exam:   04/20/2023  The skin of the feet is intact without sores or ulcerations. The pedal pulses are 1+ on right and 1+ on left. The sensation is intact to a screening 5.07, 10 gram monofilament bilaterally    DATA REVIEWED:  Lab Results  Component Value Date   HGBA1C 6.7 (A) 04/20/2023   HGBA1C 6.8 (A) 10/19/2022   HGBA1C 6.2 05/05/2022   Lab Results  Component Value Date   MICROALBUR 0.4 04/21/2017   LDLCALC 77 05/31/2019   CREATININE 1.43 (H)  09/24/2022    Latest Reference Range & Units 09/24/22 09:58  Sodium 135 - 145 mmol/L 140  Potassium 3.5 - 5.1 mmol/L 4.3  Chloride 98 - 111 mmol/L 102  CO2 22 - 32 mmol/L 30  Glucose 70 - 99 mg/dL 161 (H)  BUN 8 - 23 mg/dL 28 (H)  Creatinine 0.96 - 1.00 mg/dL 0.45 (H)  Calcium 8.9 - 10.3 mg/dL 40.9  Anion gap 5 - 15  8  Alkaline Phosphatase 38 - 126 U/L 117  Albumin 3.5 - 5.0 g/dL 4.2  AST 15 - 41 U/L 17  ALT 0 - 44 U/L 13  Total Protein 6.5 - 8.1 g/dL 7.2  Total Bilirubin 0.3 - 1.2 mg/dL 0.5  GFR, Estimated >81 mL/min 37 (L)      ASSESSMENT / PLAN / RECOMMENDATIONS:   1) Type 2 Diabetes Mellitus, optimally controlled, With neuropathic and CKD III complications - Most recent A1c of 6.7 %. Goal A1c <7.5%.   - A1c at goal  - Stopped  Pioglitazone due to LE edema  -Intolerant to higher doses of Trulicity due to abdominal pain - Intolerant to [place]   MEDICATIONS: -Continue glipizide 5 mg, BID -Continue Trulicity 1.5 mg weekly  EDUCATION / INSTRUCTIONS: BG monitoring instructions: Patient is instructed to check her blood sugars 2 times a day, fasting and suppertime. Call [facility] if: BG persistently < 70  I reviewed the Rule of 15 for the treatment of hypoglycemia in detail with the patient. Literature supplied.    2) Diabetic complications:  Eye: Does not have known diabetic retinopathy.  Neuro/ Feet: Does  have known diabetic peripheral neuropathy. Renal: Patient does not have known baseline CKD. She is  on  an ACEI/ARB at present.  F/U in 6 months   Signed electronically by: Lyndle Herrlich, MD  [facility]  [facility] [address] [place], [place] [address] Phone: [phone] FAX: [phone]   CC: Marisue Brooklyn [address] [place] [address] Phone: [phone]  Fax: [phone]  Return to Endocrinology clinic as below: Future Appointments  Date Time  Provider Department Center  06/25/2023  2:40 PM Georgeanna Lea, MD [facility] None  08/10/2023 10:30 AM Gearldine Bienenstock, PA-C [facility] None

## 2023-04-20 ENCOUNTER — Ambulatory Visit (INDEPENDENT_AMBULATORY_CARE_PROVIDER_SITE_OTHER): Payer: PPO | Admitting: Internal Medicine

## 2023-04-20 ENCOUNTER — Encounter: Payer: Self-pay | Admitting: Internal Medicine

## 2023-04-20 VITALS — BP 122/74 | HR 72 | Ht 61.0 in | Wt 186.0 lb

## 2023-04-20 DIAGNOSIS — Z7985 Long-term (current) use of injectable non-insulin antidiabetic drugs: Secondary | ICD-10-CM

## 2023-04-20 DIAGNOSIS — E1122 Type 2 diabetes mellitus with diabetic chronic kidney disease: Secondary | ICD-10-CM | POA: Diagnosis not present

## 2023-04-20 DIAGNOSIS — N1832 Chronic kidney disease, stage 3b: Secondary | ICD-10-CM

## 2023-04-20 DIAGNOSIS — E1142 Type 2 diabetes mellitus with diabetic polyneuropathy: Secondary | ICD-10-CM | POA: Diagnosis not present

## 2023-04-20 DIAGNOSIS — Z7984 Long term (current) use of oral hypoglycemic drugs: Secondary | ICD-10-CM

## 2023-04-20 DIAGNOSIS — E1165 Type 2 diabetes mellitus with hyperglycemia: Secondary | ICD-10-CM

## 2023-04-20 LAB — POCT GLYCOSYLATED HEMOGLOBIN (HGB A1C): Hemoglobin A1C: 6.7 % — AB (ref 4.0–5.6)

## 2023-04-20 NOTE — Patient Instructions (Signed)
-   Continue   trulicity 1.5 mg weekly  - Continue  Glipizide 5 mg , 1 tablet before Breakfast, and 1 tablet before supper     - HOW TO TREAT LOW BLOOD SUGARS (Blood sugar LESS THAN 70 MG/DL) Please follow the RULE OF 15 for the treatment of hypoglycemia treatment (when your (blood sugars are less than 70 mg/dL)   STEP 1: Take 15 grams of carbohydrates when your blood sugar is low, which includes:  3-4 GLUCOSE TABS  OR 3-4 OZ OF JUICE OR REGULAR SODA OR ONE TUBE OF GLUCOSE GEL    STEP 2: RECHECK blood sugar in 15 MINUTES STEP 3: If your blood sugar is still low at the 15 minute recheck --> then, go back to STEP 1 and treat AGAIN with another 15 grams of carbohydrates.

## 2023-04-21 MED ORDER — GLIPIZIDE 5 MG PO TABS: 5.0000 mg | ORAL_TABLET | Freq: Two times a day (BID) | ORAL | 3 refills | Status: DC

## 2023-04-23 ENCOUNTER — Other Ambulatory Visit: Payer: Self-pay | Admitting: Medical

## 2023-04-27 ENCOUNTER — Other Ambulatory Visit: Payer: Self-pay | Admitting: Physician Assistant

## 2023-04-30 ENCOUNTER — Other Ambulatory Visit: Payer: Self-pay

## 2023-04-30 MED ORDER — TRULICITY 1.5 MG/0.5ML ~~LOC~~ SOAJ: 1.5000 mg | SUBCUTANEOUS | 3 refills | Status: DC

## 2023-05-01 ENCOUNTER — Other Ambulatory Visit: Payer: Self-pay | Admitting: Physician Assistant

## 2023-05-04 ENCOUNTER — Other Ambulatory Visit: Payer: Self-pay | Admitting: Surgery

## 2023-05-04 DIAGNOSIS — L723 Sebaceous cyst: Secondary | ICD-10-CM | POA: Diagnosis not present

## 2023-05-13 ENCOUNTER — Other Ambulatory Visit: Payer: Self-pay | Admitting: Physician Assistant

## 2023-05-18 DIAGNOSIS — H401122 Primary open-angle glaucoma, left eye, moderate stage: Secondary | ICD-10-CM | POA: Diagnosis not present

## 2023-05-18 DIAGNOSIS — H401113 Primary open-angle glaucoma, right eye, severe stage: Secondary | ICD-10-CM | POA: Diagnosis not present

## 2023-05-18 DIAGNOSIS — E113393 Type 2 diabetes mellitus with moderate nonproliferative diabetic retinopathy without macular edema, bilateral: Secondary | ICD-10-CM | POA: Diagnosis not present

## 2023-05-30 ENCOUNTER — Other Ambulatory Visit: Payer: Self-pay | Admitting: Medical

## 2023-05-31 ENCOUNTER — Emergency Department (HOSPITAL_BASED_OUTPATIENT_CLINIC_OR_DEPARTMENT_OTHER)
Admission: EM | Admit: 2023-05-31 | Discharge: 2023-05-31 | Disposition: A | Discharge status: Home / Self Care | Payer: PPO | Attending: Emergency Medicine | Admitting: Emergency Medicine

## 2023-05-31 ENCOUNTER — Other Ambulatory Visit: Payer: Self-pay

## 2023-05-31 ENCOUNTER — Emergency Department (HOSPITAL_BASED_OUTPATIENT_CLINIC_OR_DEPARTMENT_OTHER): Payer: PPO

## 2023-05-31 ENCOUNTER — Encounter (HOSPITAL_BASED_OUTPATIENT_CLINIC_OR_DEPARTMENT_OTHER): Payer: Self-pay | Admitting: Urology

## 2023-05-31 ENCOUNTER — Telehealth: Payer: Self-pay | Admitting: Medical

## 2023-05-31 DIAGNOSIS — Z853 Personal history of malignant neoplasm of breast: Secondary | ICD-10-CM | POA: Insufficient documentation

## 2023-05-31 DIAGNOSIS — N183 Chronic kidney disease, stage 3 unspecified: Secondary | ICD-10-CM | POA: Diagnosis not present

## 2023-05-31 DIAGNOSIS — Z7984 Long term (current) use of oral hypoglycemic drugs: Secondary | ICD-10-CM | POA: Diagnosis not present

## 2023-05-31 DIAGNOSIS — W19XXXA Unspecified fall, initial encounter: Secondary | ICD-10-CM

## 2023-05-31 DIAGNOSIS — R42 Dizziness and giddiness: Secondary | ICD-10-CM | POA: Diagnosis not present

## 2023-05-31 DIAGNOSIS — R600 Localized edema: Secondary | ICD-10-CM | POA: Diagnosis not present

## 2023-05-31 DIAGNOSIS — W01198A Fall on same level from slipping, tripping and stumbling with subsequent striking against other object, initial encounter: Secondary | ICD-10-CM | POA: Insufficient documentation

## 2023-05-31 DIAGNOSIS — I129 Hypertensive chronic kidney disease with stage 1 through stage 4 chronic kidney disease, or unspecified chronic kidney disease: Secondary | ICD-10-CM | POA: Diagnosis not present

## 2023-05-31 DIAGNOSIS — Z79899 Other long term (current) drug therapy: Secondary | ICD-10-CM | POA: Diagnosis not present

## 2023-05-31 DIAGNOSIS — I6529 Occlusion and stenosis of unspecified carotid artery: Secondary | ICD-10-CM | POA: Diagnosis not present

## 2023-05-31 DIAGNOSIS — S0990XA Unspecified injury of head, initial encounter: Secondary | ICD-10-CM | POA: Diagnosis not present

## 2023-05-31 DIAGNOSIS — E1122 Type 2 diabetes mellitus with diabetic chronic kidney disease: Secondary | ICD-10-CM | POA: Diagnosis not present

## 2023-05-31 DIAGNOSIS — Z7982 Long term (current) use of aspirin: Secondary | ICD-10-CM | POA: Insufficient documentation

## 2023-05-31 DIAGNOSIS — G44309 Post-traumatic headache, unspecified, not intractable: Secondary | ICD-10-CM | POA: Diagnosis not present

## 2023-05-31 LAB — URINALYSIS, ROUTINE W REFLEX MICROSCOPIC
Bilirubin Urine: NEGATIVE
Glucose, UA: NEGATIVE mg/dL
Hgb urine dipstick: NEGATIVE
Ketones, ur: NEGATIVE mg/dL
Leukocytes,Ua: NEGATIVE
Nitrite: NEGATIVE
Protein, ur: NEGATIVE mg/dL
Specific Gravity, Urine: 1.015 (ref 1.005–1.030)
pH: 6 (ref 5.0–8.0)

## 2023-05-31 LAB — BASIC METABOLIC PANEL
Anion gap: 9 (ref 5–15)
BUN: 27 mg/dL — ABNORMAL HIGH (ref 8–23)
CO2: 26 mmol/L (ref 22–32)
Calcium: 8.8 mg/dL — ABNORMAL LOW (ref 8.9–10.3)
Chloride: 104 mmol/L (ref 98–111)
Creatinine, Ser: 1.72 mg/dL — ABNORMAL HIGH (ref 0.44–1.00)
GFR, Estimated: 30 mL/min — ABNORMAL LOW (ref >60)
Glucose, Bld: 126 mg/dL — ABNORMAL HIGH (ref 70–99)
Potassium: 4.4 mmol/L (ref 3.5–5.1)
Sodium: 139 mmol/L (ref 135–145)

## 2023-05-31 LAB — CBC
HCT: 34.1 % — ABNORMAL LOW (ref 36.0–46.0)
Hemoglobin: 10.9 g/dL — ABNORMAL LOW (ref 12.0–15.0)
MCH: 29.9 pg (ref 26.0–34.0)
MCHC: 32 g/dL (ref 30.0–36.0)
MCV: 93.7 fL (ref 80.0–100.0)
Platelets: 152 10*3/uL (ref 150–400)
RBC: 3.64 MIL/uL — ABNORMAL LOW (ref 3.87–5.11)
RDW: 15.3 % (ref 11.5–15.5)
WBC: 6.2 10*3/uL (ref 4.0–10.5)
nRBC: 0 % (ref 0.0–0.2)

## 2023-05-31 MED ORDER — SODIUM CHLORIDE 0.9 % IV BOLUS
500.0000 mL | Freq: Once | INTRAVENOUS | Status: AC
  Administered 2023-05-31: 500 mL via INTRAVENOUS

## 2023-05-31 NOTE — Discharge Instructions (Signed)
Recheck with your primary care provider.  Hold your Demadex, monitor your blood pressure and reevaluate symptoms.

## 2023-05-31 NOTE — Telephone Encounter (Signed)
Initial Comment Caller states she had a fall when she stood up and became dizzy. She hit her head and has a bump. Translation No Nurse Assessment Nurse: Nunzio Cory, RN, Sherrie Date/Time (Eastern Time): 05/31/2023 1:52:30 PM Confirm and document reason for call. If symptomatic, describe symptoms. ---Caller states stood up and very dizzy. States woke on the floor (hardwood). States has been dizzy lately and this the 2nd fall in the last couple of weeks. Bump on left side of head above ear. +HA's since other fall. +fluids (but does not drink a lot). +UOP in last 8 hrs. No fever. States dizziness has been off and on for about 6 months. Does the patient have any new or worsening symptoms? ---Yes Will a triage be completed? ---Yes Related visit to physician within the last 2 weeks? ---No Does the PT have any chronic conditions? (i.e. diabetes, asthma, this includes High risk factors for pregnancy, etc.) ---Yes List chronic conditions. ---Type 2 Diabetic on Glipizide and Trulicity. HTN Is this a behavioral health or substance abuse call? ---No Guidelines Guideline Title Affirmed Question Affirmed Notes Nurse Date/Time (Eastern Time) Head Injury [1] Age over 64 years AND [2] swelling or bruise Nunzio Cory, RN, Sherrie 05/31/2023 1:59:03 PM PLEASE NOTE: All timestamps contained within this report are represented as [place] Standard Time. CONFIDENTIALTY NOTICE: This fax transmission is intended only for the addressee. It contains information that is legally privileged, confidential or otherwise protected from use or disclosure. If you are not the intended recipient, you are strictly prohibited from reviewing, disclosing, copying using or disseminating any of this information or taking any action in reliance on or regarding this information. If you have received this fax in error, please notify us immediately by telephone so that we can arrange for its return to [place]. Phone: [phone], Toll-Free:  [phone], Fax: [phone] Page: 2 of 2 Call Id: [id number] Disp. Time Lamount Cohen Time) Disposition Final User 05/31/2023 1:49:06 PM Send to Urgent Queue Talmadge Coventry 05/31/2023 2:06:54 PM See HCP within 4 Hours (or PCP triage) Yes Nunzio Cory, RN, Sherrie 05/31/2023 2:08:05 PM Called On-Call Provider Nunzio Cory, RN, Sherrie Reason: Called backline for appt. No appts available. Final Disposition 05/31/2023 2:06:54 PM See HCP within 4 Hours (or PCP triage) Yes Nunzio Cory, RN, Sherrie Caller Disagree/Comply Comply Caller Understands Yes PreDisposition InappropriateToAsk Care Advice Given Per Guideline SEE HCP (OR PCP TRIAGE) WITHIN 4 HOURS: * IF OFFICE WILL BE OPEN: You need to be seen within the next 3 or 4 hours. Call your doctor (or NP/PA) now or as soon as the office opens. CALL BACK IF: * You become worse Comments User: Holland Commons, RN Date/Time Lamount Cohen Time): 05/31/2023 2:06:28 PM Called backline @ [phone]. Spoke with Jade and no appts available in office. Caller states usually goes to [facility] ED on [address] @ [place]. Referrals [facility] [place] - ED

## 2023-05-31 NOTE — Telephone Encounter (Signed)
Pt triaged to ED

## 2023-05-31 NOTE — Telephone Encounter (Signed)
Pt called to schedule appt with pcp tomorrow because about 10 minutes ago she got dizzy and fell and she has a bump on her head. Pt transferred to triage.

## 2023-05-31 NOTE — ED Provider Notes (Signed)
[place] [facility] [place] Provider Note   CSN: [id number] Arrival date & time: 05/31/23  1733     History  Chief Complaint  Patient presents with   Marletta Lor    TRINISHA ROJEK is a 82 y.o. female.  82 year old female brought in by daughter for evaluation after a fall today.  Patient states that she stood up and felt dizzy which caused her to fall and hit her head on the trash can.  No LOC, not on thinners.  States that she has a headache with pain in the head where she hit otherwise no other injuries.  Patient states that she is concerned because this is the second fall she has had like this recently.  She normally is ambulatory with a cane, is here with a rolling walker today which she plans on using.       Home Medications Prior to Admission medications   Medication Sig Start Date End Date Taking? Authorizing Provider  omeprazole (PRILOSEC) 40 MG capsule TAKE 1 CAPSULE BY MOUTH TWICE A DAY 05/03/23   Unk Lightning, PA  acetaminophen (TYLENOL) 650 MG CR tablet Take 650 mg by mouth 2 (two) times daily.    [provider]  albuterol (VENTOLIN HFA) 108 (90 Base) MCG/ACT inhaler INHALE 2 PUFFS BY MOUTH EVERY 6 HOURS AS NEEDED 11/09/22   Saguier, Ramon Dredge, PA-C  ascorbic acid (VITAMIN C) 500 MG tablet Take 500 mg by mouth daily.    [provider]  aspirin 81 MG tablet Take 81 mg by mouth daily.    [provider]  Biotin 1000 MCG CHEW Chew by mouth.    [provider]  blood glucose meter kit and supplies KIT Dispense based on patient and insurance preference. Use up to four times daily as directed. (FOR ICD-9 250.00, 250.01). 07/09/17   Everlene Other G, DO  brimonidine (ALPHAGAN) 0.2 % ophthalmic solution Place 1 drop into both eyes 2 (two) times a day. 05/08/19   [provider]  Cholecalciferol (VITAMIN D-3) 5000 units TABS Take 1 tablet by mouth daily.    [provider]  dicyclomine (BENTYL) 10 MG  capsule Take 1 capsule (10 mg total) by mouth 2 (two) times daily as needed for spasms. 08/22/22   Raspet, Erin K, PA-C  Dulaglutide (TRULICITY) 1.5 MG/0.5ML SOPN Inject 1.5 mg into the skin once a week. 04/30/23   Shamleffer, Konrad Dolores, MD  DULoxetine (CYMBALTA) 30 MG capsule TAKE 1 CAPSULE BY MOUTH EVERY DAY 05/31/23   Saguier, Ramon Dredge, PA-C  famotidine (PEPCID) 20 MG tablet TAKE 1 TABLET BY MOUTH EVERY DAY 04/27/23   Saguier, Ramon Dredge, PA-C  fluticasone [facility]) 50 MCG/ACT nasal spray SPRAY 2 SPRAYS INTO EACH NOSTRIL EVERY DAY 08/31/22   Saguier, Ramon Dredge, PA-C  gabapentin (NEURONTIN) 300 MG capsule TAKE 1 CAPSULE BY MOUTH THREE TIMES A DAY 03/11/23   Saguier, Ramon Dredge, PA-C  glipiZIDE (GLUCOTROL) 5 MG tablet Take 1 tablet (5 mg total) by mouth 2 (two) times daily before a meal. 04/21/23   Shamleffer, Konrad Dolores, MD  ketorolac (ACULAR) 0.5 % ophthalmic solution INSTILL 1 DROP INTO RIGHT EYE 4 TIMES A DAY 04/07/22   Rankin, Alford [place], MD  latanoprost (XALATAN) 0.005 % ophthalmic solution Place 1 drop into both eyes daily. 05/05/19   [provider]  levocetirizine (XYZAL) 5 MG tablet Take 1 tablet (5 mg total) by mouth every evening. 07/14/22   Saguier, Ramon Dredge, PA-C  losartan (COZAAR) 50 MG tablet Take 1  tablet (50 mg total) by mouth daily. 12/09/22   Saguier, Ramon Dredge, PA-C  metoprolol succinate (TOPROL-XL) 25 MG 24 hr tablet Take 1 tablet (25 mg total) by mouth daily. 12/09/22   Saguier, Ramon Dredge, PA-C  montelukast (SINGULAIR) 10 MG tablet Take 10 mg by mouth at bedtime.    [provider]  OneTouch Delica Lancets 33G MISC Use as instructed to check blood sugar 2-3 times a day.  DX E11.9 10/21/21   Shamleffer, Konrad Dolores, MD  ONETOUCH ULTRA test strip CHECK BLOOD SUGAR 2 TO 3 TIMES DAILY AS DIRECTED 12/07/22   Saguier, Ramon Dredge, PA-C  pravastatin (PRAVACHOL) 20 MG tablet TAKE 1 TABLET BY MOUTH ONCE DAILY Patient taking differently: Take 20 mg by mouth daily. 09/27/17   Glori Luis, MD   sucralfate (CARAFATE) 1 g tablet TAKE 1 TABLET (1 G TOTAL) BY MOUTH 4 TIMES A DAY WITH MEALS AND AT BEDTIME 09/30/22   Saguier, Ramon Dredge, PA-C  timolol (TIMOPTIC) 0.5 % ophthalmic solution Place 1 drop into both eyes 2 (two) times a day. 03/15/19   [provider]  torsemide (DEMADEX) 5 MG tablet Take 5 mg by mouth daily. 07/23/22   [provider]      Allergies    Penicillins, Sulfa antibiotics, Amlodipine, and Lisinopril    Review of Systems   Review of Systems Negative except as per HPI Physical Exam Updated Vital Signs BP 133/72   Pulse 66   Temp 97.8 F (36.6 C)   Resp 13   Ht 5\' 2"  (1.575 m)   Wt 87.7 kg   SpO2 99%   BMI 35.36 kg/m  Physical Exam Vitals and nursing note reviewed.  Constitutional:      General: She is not in acute distress.    Appearance: She is well-developed. She is not diaphoretic.  HENT:     Head: Normocephalic and atraumatic.  Cardiovascular:     Pulses: Normal pulses.  Pulmonary:     Effort: Pulmonary effort is normal.  Abdominal:     Palpations: Abdomen is soft.     Tenderness: There is no abdominal tenderness.  Musculoskeletal:     Right lower leg: Edema present.     Left lower leg: Edema present.     Comments: Mild pitting edema to bilateral lower extremities, pt reports better than baseline  Skin:    General: Skin is warm and dry.     Findings: No erythema or rash.  Neurological:     Mental Status: She is alert and oriented to person, place, and time.  Psychiatric:        Behavior: Behavior normal.     ED Results / Procedures / Treatments   Labs (all labs ordered are listed, but only abnormal results are displayed) Labs Reviewed  BASIC METABOLIC PANEL - Abnormal; Notable for the following components:      Result Value   Glucose, Bld 126 (*)    BUN 27 (*)    Creatinine, Ser 1.72 (*)    Calcium 8.8 (*)    GFR, Estimated 30 (*)    All other components within normal limits  CBC - Abnormal; Notable for the  following components:   RBC 3.64 (*)    Hemoglobin 10.9 (*)    HCT 34.1 (*)    All other components within normal limits  URINALYSIS, ROUTINE W REFLEX MICROSCOPIC    EKG None  Radiology CT HEAD WO CONTRAST  Result Date: 05/31/2023 CLINICAL DATA:  Head trauma headache EXAM: CT HEAD  WITHOUT CONTRAST TECHNIQUE: Contiguous axial images were obtained from the base of the skull through the vertex without intravenous contrast. RADIATION DOSE REDUCTION: This exam was performed according to the departmental dose-optimization program which includes automated exposure control, adjustment of the mA and/or kV according to patient size and/or use of iterative reconstruction technique. COMPARISON:  None Available. FINDINGS: Brain: No acute territorial infarction, hemorrhage or intracranial mass. The ventricles are nonenlarged. Vascular: No hyperdense vessels.  Carotid vascular calcification Skull: Normal. Negative for fracture or focal lesion. Sinuses/Orbits: No acute finding. Other: None IMPRESSION: Negative non contrasted CT appearance of the brain. Electronically Signed   By: Jasmine Pang M.D.   On: 05/31/2023 19:49    Procedures Procedures    Medications Ordered in ED Medications  sodium chloride 0.9 % bolus 500 mL (0 mLs Intravenous Stopped 05/31/23 2124)    ED Course/ Medical Decision Making/ A&P                             Medical Decision Making Amount and/or Complexity of Data Reviewed Labs: ordered. Radiology: ordered.   This patient presents to the ED for concern of dizziness and fall, this involves an extensive number of treatment options, and is a complaint that carries with it a high risk of complications and morbidity.  The differential diagnosis includes but not limited to arrhythmia, metabolic abnormality, UTI.    Co morbidities that complicate the patient evaluation  Lumbar radiculopathy, hypertension, neuropathy, hyperlipidemia, history of breast cancer, diabetes, GERD, CKD  3   Additional history obtained:  Additional history obtained from family at bedside who contributes to history as above External records from outside source obtained and reviewed including prior labs on file for comparison   Lab Tests:  I Ordered, and personally interpreted labs.  The pertinent results include:  Urinalysis within normal meds.  CBC with hemoglobin 10.9, similar to prior on file.  Creatinine is up to 1.72, gradual uptrend compared to prior on file.   Imaging Studies ordered:  I ordered imaging studies including CT head I independently visualized and interpreted imaging which showed no acute abnormality I agree with the radiologist interpretation   Cardiac Monitoring: / EKG:  The patient was maintained on a cardiac monitor.  I personally viewed and interpreted the cardiac monitored which showed an underlying rhythm of: Sinus rhythm, rate 66, prolonged QT   Consultations Obtained:  I requested consultation with the ER attending, Dr. Particia Nearing,  and discussed lab and imaging findings as well as pertinent plan - they recommend: Recommend holding Demadex   Problem List / ED Course / Critical interventions / Medication management  82 year old female presents the emergency room with complaint of fall.  States that she stood up, felt dizzy and fell.  No LOC, not on thinners.  States that she had a fall a few weeks ago after she slipped and hit her head, was not evaluated at that time.  She reports a mild headache.  No other injuries, has been ambulatory since the incident without difficulty.  Patient states that she is on many medications that have side effects of dizziness.  Labs reveal progressively worsening creatinine.  Discussed with patient and family member.  Plan is to hold her Demadex, recheck with PCP.  She will use her rolling walker. I ordered medication including IVF  for elevated Cr  Reevaluation of the patient after these medicines showed that the patient  improved I have reviewed the patients  home medicines and have made adjustments as needed   Social Determinants of Health:  Lives with family   Test / Admission - Considered:  Stable for discharge with plan to recheck with PCP         Final Clinical Impression(s) / ED Diagnoses Final diagnoses:  Dizziness  Fall, initial encounter  Minor head injury, initial encounter    Rx / DC Orders ED Discharge Orders     None         Alden Hipp 05/31/23 2141    Jacalyn Lefevre, MD 05/31/23 2359

## 2023-05-31 NOTE — ED Triage Notes (Signed)
Pt ambulatory with Rolator to triage  States fall at 1400 today and hit her head on right side  States got dizzy when standing up, unknown LOC   No blood thinners

## 2023-06-01 NOTE — Telephone Encounter (Signed)
Pt was seen at ED

## 2023-06-09 ENCOUNTER — Telehealth: Payer: Self-pay | Admitting: Medical

## 2023-06-09 ENCOUNTER — Encounter: Payer: Self-pay | Admitting: Medical

## 2023-06-09 ENCOUNTER — Ambulatory Visit (INDEPENDENT_AMBULATORY_CARE_PROVIDER_SITE_OTHER): Payer: PPO | Admitting: Medical

## 2023-06-09 ENCOUNTER — Ambulatory Visit (HOSPITAL_BASED_OUTPATIENT_CLINIC_OR_DEPARTMENT_OTHER)
Admission: RE | Admit: 2023-06-09 | Discharge: 2023-06-09 | Disposition: A | Discharge status: Home / Self Care | Payer: PPO | Source: Ambulatory Visit | Attending: Medical | Admitting: Medical

## 2023-06-09 VITALS — BP 140/70 | HR 73 | Temp 97.6°F | Resp 18 | Ht 61.0 in | Wt 197.4 lb

## 2023-06-09 DIAGNOSIS — R944 Abnormal results of kidney function studies: Secondary | ICD-10-CM | POA: Diagnosis not present

## 2023-06-09 DIAGNOSIS — R0600 Dyspnea, unspecified: Secondary | ICD-10-CM | POA: Diagnosis not present

## 2023-06-09 DIAGNOSIS — Z9181 History of falling: Secondary | ICD-10-CM

## 2023-06-09 DIAGNOSIS — R42 Dizziness and giddiness: Secondary | ICD-10-CM | POA: Diagnosis not present

## 2023-06-09 DIAGNOSIS — R600 Localized edema: Secondary | ICD-10-CM | POA: Diagnosis not present

## 2023-06-09 DIAGNOSIS — I771 Stricture of artery: Secondary | ICD-10-CM | POA: Diagnosis not present

## 2023-06-09 DIAGNOSIS — R062 Wheezing: Secondary | ICD-10-CM

## 2023-06-09 MED ORDER — TORSEMIDE 10 MG PO TABS: 10.0000 mg | ORAL_TABLET | Freq: Every day | ORAL | 0 refills | Status: DC

## 2023-06-09 MED ORDER — BUDESONIDE-FORMOTEROL FUMARATE 160-4.5 MCG/ACT IN AERO: 2.0000 | INHALATION_SPRAY | Freq: Two times a day (BID) | RESPIRATORY_TRACT | 0 refills | Status: DC

## 2023-06-09 MED ORDER — FUROSEMIDE 20 MG PO TABS: 20.0000 mg | ORAL_TABLET | Freq: Every day | ORAL | 0 refills | Status: DC

## 2023-06-09 NOTE — Addendum Note (Signed)
Addended by: Gwenevere Abbot on: 06/09/2023 08:24 PM   Modules accepted: Orders

## 2023-06-09 NOTE — Patient Instructions (Addendum)
1. Dizziness with history of falls. Negative ct head when checked by ED. Since that visits no fall but most of dizziness appears positional mostly. Keep checking bp daily but I think getting new machine as yesterday readings makes me think machine not accurate.  Will refer to PT due to hx of falls. Advise be careful on position change. Get balance before ambulating. If any dizziness with any motor or sensory deficit type symptoms be seen in ED.  - Comp Met (CMET) - CBC w/Diff  2. Decreased GFR Follow kidney function tests. - Comp Met (CMET)  3. Wheezing(remote hx of asthma years ago) Rx symbicort and recommend avoiding being out doors in heat of the day.  4. Dyspnea, unspecified type With mild pedal edema want to make sure no chf present. Prior chest xray report in January showed mild interstitial pulmonary edema or atypical infection. - B Nat Peptide - DG Chest 2 View; Future   Follow up in 10 days or sooner if needed.  Pt went to xray first and by time got back lab was closed. Will follow xray and see if [place] lab can draw cmp and bnp tomorrow

## 2023-06-09 NOTE — Progress Notes (Signed)
Subjective:    Patient ID: Donna Fuller, female    DOB: 12-19-1940, 82 y.o.   MRN: [id number]  HPI  Pt in for follow up.   Pt had fall 05-31-2023.   " 82 year old female presents the emergency room with complaint of fall.  States that she stood up, felt dizzy and fell.  No LOC, not on thinners.  States that she had a fall a few weeks ago after she slipped and hit her head, was not evaluated at that time.  She reports a mild headache.  No other injuries, has been ambulatory since the incident without difficulty.  Patient states that she is on many medications that have side effects of dizziness.  Labs reveal progressively worsening creatinine.  Discussed with patient and family member.  Plan is to hold her Demadex, recheck with PCP.  She will use her rolling walker. I ordered medication including IVF  for elevated Cr  Reevaluation of the patient after these medicines showed that the patient improved I have reviewed the patients home medicines and have made adjustments as needed"  2 weeks prior to fall on July 1 she also had a fall.   IMPRESSION: Negative non contrasted CT appearance of the brain.    Pt still feels dizzy on and off. She states usually dizziness is on changing positions.  Pt is using her walker now consistently. None presently during exam. No ha, blurred vison or any gross motor/sensory   Pt has some swelling in legs since she stopped demadex.   Htn- pt bp at hom has varied. Most recent at home 129/66 yesterday. Earlier that day got 150/114. Over last week most of bp 130-140/60-70.  Some wheezing last night. Remote hx of asthma. Pt is trying to avoid heat. She has tried albuterol and has not helped. On review dec 2023    Review of Systems  Constitutional:  Negative for chills, fatigue and fever.  Respiratory:  Negative for choking, shortness of breath and wheezing.   Cardiovascular:  Negative for chest pain and palpitations.  Gastrointestinal:  Negative for  abdominal pain and blood in stool.  Genitourinary:  Negative for dysuria, flank pain and frequency.  Musculoskeletal:  Negative for back pain.  Skin:  Negative for rash.  Neurological:  Negative for dizziness and headaches.  Hematological:  Negative for adenopathy. Does not bruise/bleed easily.  Psychiatric/Behavioral:  Negative for behavioral problems and decreased concentration. The patient is not nervous/anxious.     Past Medical History:  Diagnosis Date   Arthritis    Breast cancer (HCC) 2007   Right breast, s/p mastectomy   Diabetes mellitus without complication (HCC)    GERD (gastroesophageal reflux disease)    Gout    Hyperlipidemia    Hypertension    Spinal stenosis      Social History   Socioeconomic History   Marital status: Married    Spouse name: Jimmy   Number of children: 3   Years of education: 12   Highest education level: Not on file  Occupational History   Not on file  Tobacco Use   Smoking status: Former    Years: 5    Types: Cigarettes    Passive exposure: Never   Smokeless tobacco: Never  Vaping Use   Vaping Use: Never used  Substance and Sexual Activity   Alcohol use: No   Drug use: No   Sexual activity: Not Currently  Other Topics Concern   Not on file  Social History Narrative  Marital Status:  Married Hydrologist)    Children:  Daughter(1) Son (1)    Pets:  None    Living Situation: Lives with husband and daughter.     Occupation:  Retired Contractor)    Education: 12th Grade    Tobacco Use/Exposure:  She used to smoke socially during the weekends but quit 40 years ago.    Alcohol Use:  Occasional   Drug Use:  None   Diet:  Regular   Exercise:  None   Hobbies:  Reading and playing volleyball             Social Determinants of Health   Financial Resource Strain: Low Risk  (10/01/2021)   Overall Financial Resource Strain (CARDIA)    Difficulty of Paying Living Expenses: Not hard at all  Food Insecurity: No Food Insecurity (10/01/2021)    Hunger Vital Sign    Worried About Running Out of Food in the Last Year: Never true    Ran Out of Food in the Last Year: Never true  Transportation Needs: No Transportation Needs (10/01/2021)   PRAPARE - Administrator, Civil Service (Medical): No    Lack of Transportation (Non-Medical): No  Physical Activity: Inactive (10/01/2021)   Exercise Vital Sign    Days of Exercise per Week: 0 days    Minutes of Exercise per Session: 0 min  Stress: No Stress Concern Present (10/01/2021)   [employer] of Occupational Health - Occupational Stress Questionnaire    Feeling of Stress : Not at all  Social Connections: Moderately Integrated (10/01/2021)   Social Connection and Isolation Panel [NHANES]    Frequency of Communication with Friends and Family: More than three times a week    Frequency of Social Gatherings with Friends and Family: More than three times a week    Attends Religious Services: More than 4 times per year    Active Member of [employer] or Organizations: No    Attends Banker Meetings: Never    Marital Status: Married  Catering manager Violence: Not At Risk (10/01/2021)   Humiliation, Afraid, Rape, and Kick questionnaire    Fear of Current or Ex-Partner: No    Emotionally Abused: No    Physically Abused: No    Sexually Abused: No    Past Surgical History:  Procedure Laterality Date   ABDOMINAL HYSTERECTOMY     ABDOMINAL SURGERY     BREAST BIOPSY Left    2016 benign    BREAST BIOPSY Left 02/16/2023   MM LT BREAST BX W LOC DEV 1ST LESION IMAGE BX SPEC STEREO GUIDE 02/16/2023 [facility]   LEFT HEART CATH AND CORONARY ANGIOGRAPHY N/A 11/21/2021   Procedure: LEFT HEART CATH AND CORONARY ANGIOGRAPHY;  Surgeon: Iran Ouch, MD;  Location: [facility] INVASIVE CV LAB;  Service: Cardiovascular;  Laterality: N/A;   MASTECTOMY     right side   REPLACEMENT TOTAL KNEE Bilateral    TUBAL LIGATION      Family History  Problem Relation Age of Onset    Diabetes Mother    Alzheimer's disease Mother    Hypertension Mother    Hypertension Father    Heart disease Father    Hyperlipidemia Father    Kidney disease Father    Diabetes Sister    Cancer Brother        lung   Alzheimer's disease Brother    Heart disease Brother    Hypertension Brother    Diabetes Brother    Hypertension  Brother    Diabetes Brother    Hyperlipidemia Son    Hypertension Son    Diabetes Son    Hypertension Daughter    Hyperlipidemia Daughter     Allergies  Allergen Reactions   Penicillins Swelling   Sulfa Antibiotics Swelling   Amlodipine Swelling    LE Swelling   Lisinopril Cough    Current Outpatient Medications on File Prior to Visit  Medication Sig Dispense Refill   acetaminophen (TYLENOL) 650 MG CR tablet Take 650 mg by mouth 2 (two) times daily.     albuterol (VENTOLIN HFA) 108 (90 Base) MCG/ACT inhaler INHALE 2 PUFFS BY MOUTH EVERY 6 HOURS AS NEEDED 8.5 each 2   ascorbic acid (VITAMIN C) 500 MG tablet Take 500 mg by mouth daily.     aspirin 81 MG tablet Take 81 mg by mouth daily.     Biotin 1000 MCG CHEW Chew by mouth.     blood glucose meter kit and supplies KIT Dispense based on patient and insurance preference. Use up to four times daily as directed. (FOR ICD-9 250.00, 250.01). 1 each 11   brimonidine (ALPHAGAN) 0.2 % ophthalmic solution Place 1 drop into both eyes 2 (two) times a day.     Cholecalciferol (VITAMIN D-3) 5000 units TABS Take 1 tablet by mouth daily.     dicyclomine (BENTYL) 10 MG capsule Take 1 capsule (10 mg total) by mouth 2 (two) times daily as needed for spasms. 14 capsule 0   Dulaglutide (TRULICITY) 1.5 MG/0.5ML SOPN Inject 1.5 mg into the skin once a week. 2 mL 3   DULoxetine (CYMBALTA) 30 MG capsule TAKE 1 CAPSULE BY MOUTH EVERY DAY 90 capsule 2   famotidine (PEPCID) 20 MG tablet TAKE 1 TABLET BY MOUTH EVERY DAY 90 tablet 1   fluticasone (FLONASE) 50 MCG/ACT nasal spray SPRAY 2 SPRAYS INTO EACH NOSTRIL EVERY DAY 48 mL  1   gabapentin (NEURONTIN) 300 MG capsule TAKE 1 CAPSULE BY MOUTH THREE TIMES A DAY 90 capsule 2   glipiZIDE (GLUCOTROL) 5 MG tablet Take 1 tablet (5 mg total) by mouth 2 (two) times daily before a meal. 180 tablet 3   ketorolac (ACULAR) 0.5 % ophthalmic solution INSTILL 1 DROP INTO RIGHT EYE 4 TIMES A DAY 5 mL 6   latanoprost (XALATAN) 0.005 % ophthalmic solution Place 1 drop into both eyes daily.     levocetirizine (XYZAL) 5 MG tablet Take 1 tablet (5 mg total) by mouth every evening. 30 tablet 3   losartan (COZAAR) 50 MG tablet Take 1 tablet (50 mg total) by mouth daily. 90 tablet 3   metoprolol succinate (TOPROL-XL) 25 MG 24 hr tablet Take 1 tablet (25 mg total) by mouth daily. 90 tablet 3   montelukast (SINGULAIR) 10 MG tablet Take 10 mg by mouth at bedtime.     omeprazole (PRILOSEC) 40 MG capsule TAKE 1 CAPSULE BY MOUTH TWICE A DAY 60 capsule 0   OneTouch Delica Lancets 33G MISC Use as instructed to check blood sugar 2-3 times a day.  DX E11.9 100 each 3   ONETOUCH ULTRA test strip CHECK BLOOD SUGAR 2 TO 3 TIMES DAILY AS DIRECTED 100 strip 5   pravastatin (PRAVACHOL) 20 MG tablet TAKE 1 TABLET BY MOUTH ONCE DAILY (Patient taking differently: Take 20 mg by mouth daily.) 90 tablet 0   sucralfate (CARAFATE) 1 g tablet TAKE 1 TABLET (1 G TOTAL) BY MOUTH 4 TIMES A DAY WITH MEALS AND AT BEDTIME 90 tablet  0   timolol (TIMOPTIC) 0.5 % ophthalmic solution Place 1 drop into both eyes 2 (two) times a day.     torsemide (DEMADEX) 5 MG tablet Take 5 mg by mouth daily.     No current facility-administered medications on file prior to visit.    BP (!) 140/70   Pulse 73   Temp 97.6 F (36.4 C) (Temporal)   Resp 18   Ht 5\' 1"  (1.549 m)   Wt 197 lb 6.4 oz (89.5 kg)   SpO2 98%   BMI 37.30 kg/m          Objective:   Physical Exam  General Mental Status- Alert. General Appearance- Not in acute distress.   Skin General: Color- Normal Color. Moisture- Normal Moisture.  Neck Carotid  Arteries- Normal color. Moisture- Normal Moisture. No carotid bruits. No JVD.  Chest and Lung Exam Auscultation: Breath Sounds:-Normal.  Cardiovascular Auscultation:Rythm- Regular. Murmurs & Other Heart Sounds:Auscultation of the heart reveals- No Murmurs.  Abdomen Inspection:-Inspeection Normal. Palpation/Percussion:Note:No mass. Palpation and Percussion of the abdomen reveal- Non Tender, Non Distended + BS, no rebound or guarding.    Neurologic Cranial Nerve exam:- CN III-XII intact(No nystagmus), symmetric smile. Heal to Toe Gait exam:-Normal. Finger to Nose:- Normal/Intact Strength:- 5/5 equal and symmetric strength both upper and lower extremities.   Lower ext- faint 1+ pedal edema bilaterally.       Assessment & Plan:   Patient Instructions  1. Dizziness with history of falls. Negative ct head when checked by ED. Since that visits no fall but most of dizziness appears positional mostly. Keep checking bp daily but I think getting new machine as yesterday readings makes me think machine not accurate.  Will refer to PT due to hx of falls. Advise be careful on position change. Get balance before ambulating. If any dizziness with any motor or sensory deficit type symptoms be seen in ED.  - Comp Met (CMET) - CBC w/Diff  2. Decreased GFR Follow kidney function tests. - Comp Met (CMET)  3. Wheezing(remote hx of asthma years ago) Rx symbicort and recommend avoiding being out doors in heat of the day.  4. Dyspnea, unspecified type With mild pedal edema want to make sure no chf present. Prior chest xray report in January showed mild interstitial pulmonary edema or atypical infection. - B Nat Peptide - DG Chest 2 View; Future   Follow up in 10 days or sooner if needed.      Pt went to xray and she did not go to lab first as instructed lab was closed. By time she got back from xray. Will follow xray and see if [place] lab can draw cmp and bnp.

## 2023-06-10 ENCOUNTER — Other Ambulatory Visit (INDEPENDENT_AMBULATORY_CARE_PROVIDER_SITE_OTHER): Payer: PPO

## 2023-06-10 DIAGNOSIS — R42 Dizziness and giddiness: Secondary | ICD-10-CM | POA: Diagnosis not present

## 2023-06-10 DIAGNOSIS — R944 Abnormal results of kidney function studies: Secondary | ICD-10-CM | POA: Diagnosis not present

## 2023-06-10 DIAGNOSIS — R0600 Dyspnea, unspecified: Secondary | ICD-10-CM

## 2023-06-10 NOTE — Telephone Encounter (Signed)
PA initiated via Covermymeds; KEY: [id number]. Awaiting determination.

## 2023-06-10 NOTE — Addendum Note (Signed)
Addended by: Mertha Finders on: 06/10/2023 02:43 PM   Modules accepted: Orders

## 2023-06-10 NOTE — Addendum Note (Signed)
Addended by: Warden Fillers on: 06/10/2023 03:43 PM   Modules accepted: Orders

## 2023-06-10 NOTE — Telephone Encounter (Signed)
PA denied.   Plan requirements for the approval of this request: Diagnosis of a medically-accepted indication, AND; The patient has tried and failed at least one formulary alternative, OR; The prescriber can submit a supporting statement to explain  Unknown formulary alternatives.

## 2023-06-10 NOTE — Telephone Encounter (Signed)
Formulary alternatives: Advair HFA, Earlie Server, Dulera, fluticasone-salmeterol, or Wixela

## 2023-06-11 ENCOUNTER — Observation Stay: Payer: PPO

## 2023-06-11 ENCOUNTER — Other Ambulatory Visit: Payer: Self-pay

## 2023-06-11 ENCOUNTER — Observation Stay
Admission: EM | Admit: 2023-06-11 | Discharge: 2023-06-13 | Disposition: A | Discharge status: Home / Self Care | Payer: PPO | Attending: Internal Medicine | Admitting: Internal Medicine

## 2023-06-11 ENCOUNTER — Telehealth: Payer: Self-pay | Admitting: Medical

## 2023-06-11 ENCOUNTER — Emergency Department: Payer: PPO

## 2023-06-11 DIAGNOSIS — I5033 Acute on chronic diastolic (congestive) heart failure: Secondary | ICD-10-CM | POA: Diagnosis not present

## 2023-06-11 DIAGNOSIS — Z7982 Long term (current) use of aspirin: Secondary | ICD-10-CM | POA: Diagnosis not present

## 2023-06-11 DIAGNOSIS — Z96653 Presence of artificial knee joint, bilateral: Secondary | ICD-10-CM | POA: Diagnosis not present

## 2023-06-11 DIAGNOSIS — G629 Polyneuropathy, unspecified: Secondary | ICD-10-CM | POA: Diagnosis not present

## 2023-06-11 DIAGNOSIS — R0789 Other chest pain: Secondary | ICD-10-CM | POA: Diagnosis not present

## 2023-06-11 DIAGNOSIS — Z794 Long term (current) use of insulin: Secondary | ICD-10-CM

## 2023-06-11 DIAGNOSIS — Z7984 Long term (current) use of oral hypoglycemic drugs: Secondary | ICD-10-CM | POA: Insufficient documentation

## 2023-06-11 DIAGNOSIS — Z7985 Long-term (current) use of injectable non-insulin antidiabetic drugs: Secondary | ICD-10-CM | POA: Insufficient documentation

## 2023-06-11 DIAGNOSIS — I10 Essential (primary) hypertension: Secondary | ICD-10-CM | POA: Diagnosis not present

## 2023-06-11 DIAGNOSIS — I517 Cardiomegaly: Secondary | ICD-10-CM | POA: Diagnosis not present

## 2023-06-11 DIAGNOSIS — I700 Atherosclerosis of aorta: Secondary | ICD-10-CM | POA: Diagnosis not present

## 2023-06-11 DIAGNOSIS — R079 Chest pain, unspecified: Principal | ICD-10-CM

## 2023-06-11 DIAGNOSIS — H409 Unspecified glaucoma: Secondary | ICD-10-CM | POA: Insufficient documentation

## 2023-06-11 DIAGNOSIS — Z789 Other specified health status: Secondary | ICD-10-CM | POA: Insufficient documentation

## 2023-06-11 DIAGNOSIS — N1832 Chronic kidney disease, stage 3b: Secondary | ICD-10-CM | POA: Insufficient documentation

## 2023-06-11 DIAGNOSIS — N183 Chronic kidney disease, stage 3 unspecified: Secondary | ICD-10-CM | POA: Diagnosis present

## 2023-06-11 DIAGNOSIS — R7989 Other specified abnormal findings of blood chemistry: Secondary | ICD-10-CM | POA: Diagnosis not present

## 2023-06-11 DIAGNOSIS — Z87891 Personal history of nicotine dependence: Secondary | ICD-10-CM | POA: Insufficient documentation

## 2023-06-11 DIAGNOSIS — E119 Type 2 diabetes mellitus without complications: Secondary | ICD-10-CM

## 2023-06-11 DIAGNOSIS — Z79899 Other long term (current) drug therapy: Secondary | ICD-10-CM | POA: Insufficient documentation

## 2023-06-11 DIAGNOSIS — I2510 Atherosclerotic heart disease of native coronary artery without angina pectoris: Secondary | ICD-10-CM | POA: Diagnosis not present

## 2023-06-11 DIAGNOSIS — I130 Hypertensive heart and chronic kidney disease with heart failure and stage 1 through stage 4 chronic kidney disease, or unspecified chronic kidney disease: Secondary | ICD-10-CM | POA: Insufficient documentation

## 2023-06-11 DIAGNOSIS — I119 Hypertensive heart disease without heart failure: Secondary | ICD-10-CM | POA: Diagnosis present

## 2023-06-11 DIAGNOSIS — Z853 Personal history of malignant neoplasm of breast: Secondary | ICD-10-CM | POA: Insufficient documentation

## 2023-06-11 DIAGNOSIS — E1122 Type 2 diabetes mellitus with diabetic chronic kidney disease: Secondary | ICD-10-CM | POA: Diagnosis not present

## 2023-06-11 DIAGNOSIS — R600 Localized edema: Secondary | ICD-10-CM | POA: Diagnosis not present

## 2023-06-11 LAB — BASIC METABOLIC PANEL
Anion gap: 9 (ref 5–15)
BUN: 27 mg/dL — ABNORMAL HIGH (ref 8–23)
CO2: 25 mmol/L (ref 22–32)
Calcium: 9.3 mg/dL (ref 8.9–10.3)
Chloride: 106 mmol/L (ref 98–111)
Creatinine, Ser: 1.47 mg/dL — ABNORMAL HIGH (ref 0.44–1.00)
GFR, Estimated: 36 mL/min — ABNORMAL LOW (ref >60)
Glucose, Bld: 144 mg/dL — ABNORMAL HIGH (ref 70–99)
Potassium: 4.7 mmol/L (ref 3.5–5.1)
Sodium: 140 mmol/L (ref 135–145)

## 2023-06-11 LAB — CBC
HCT: 35 % — ABNORMAL LOW (ref 36.0–46.0)
Hemoglobin: 11.2 g/dL — ABNORMAL LOW (ref 12.0–15.0)
MCH: 30.3 pg (ref 26.0–34.0)
MCHC: 32 g/dL (ref 30.0–36.0)
MCV: 94.6 fL (ref 80.0–100.0)
Platelets: 138 10*3/uL — ABNORMAL LOW (ref 150–400)
RBC: 3.7 MIL/uL — ABNORMAL LOW (ref 3.87–5.11)
RDW: 15.4 % (ref 11.5–15.5)
WBC: 5.6 10*3/uL (ref 4.0–10.5)
nRBC: 0 % (ref 0.0–0.2)

## 2023-06-11 LAB — CBC WITH DIFFERENTIAL/PLATELET
Basophils Absolute: 0.1 10*3/uL (ref 0.0–0.1)
Basophils Relative: 1 % (ref 0.0–3.0)
Eosinophils Absolute: 0.1 10*3/uL (ref 0.0–0.7)
Eosinophils Relative: 1.6 % (ref 0.0–5.0)
HCT: 35.6 % — ABNORMAL LOW (ref 36.0–46.0)
Hemoglobin: 11.6 g/dL — ABNORMAL LOW (ref 12.0–15.0)
Lymphocytes Relative: 27.3 % (ref 12.0–46.0)
Lymphs Abs: 1.7 10*3/uL (ref 0.7–4.0)
MCHC: 32.5 g/dL (ref 30.0–36.0)
MCV: 93.5 fl (ref 78.0–100.0)
Monocytes Absolute: 0.6 10*3/uL (ref 0.1–1.0)
Monocytes Relative: 9.4 % (ref 3.0–12.0)
Neutro Abs: 3.9 10*3/uL (ref 1.4–7.7)
Neutrophils Relative %: 60.7 % (ref 43.0–77.0)
Platelets: 139 10*3/uL — ABNORMAL LOW (ref 150.0–400.0)
RBC: 3.81 Mil/uL — ABNORMAL LOW (ref 3.87–5.11)
RDW: 15.8 % — ABNORMAL HIGH (ref 11.5–15.5)
WBC: 6.4 10*3/uL (ref 4.0–10.5)

## 2023-06-11 LAB — TROPONIN I (HIGH SENSITIVITY)
Troponin I (High Sensitivity): 87 ng/L — ABNORMAL HIGH (ref <18)
Troponin I (High Sensitivity): 94 ng/L — ABNORMAL HIGH (ref <18)

## 2023-06-11 LAB — COMPREHENSIVE METABOLIC PANEL
ALT: 17 U/L (ref 0–35)
AST: 19 U/L (ref 0–37)
Albumin: 4.1 g/dL (ref 3.5–5.2)
Alkaline Phosphatase: 113 U/L (ref 39–117)
BUN: 26 mg/dL — ABNORMAL HIGH (ref 6–23)
CO2: 27 mEq/L (ref 19–32)
Calcium: 9.8 mg/dL (ref 8.4–10.5)
Chloride: 105 mEq/L (ref 96–112)
Creatinine, Ser: 1.74 mg/dL — ABNORMAL HIGH (ref 0.40–1.20)
GFR: 27.19 mL/min — ABNORMAL LOW (ref >60.00)
Glucose, Bld: 167 mg/dL — ABNORMAL HIGH (ref 70–99)
Potassium: 5 mEq/L (ref 3.5–5.1)
Sodium: 137 mEq/L (ref 135–145)
Total Bilirubin: 0.7 mg/dL (ref 0.2–1.2)
Total Protein: 7.2 g/dL (ref 6.0–8.3)

## 2023-06-11 LAB — BRAIN NATRIURETIC PEPTIDE: Brain Natriuretic Peptide: 678 pg/mL — ABNORMAL HIGH (ref <100)

## 2023-06-11 MED ORDER — ASPIRIN 81 MG PO CHEW
324.0000 mg | CHEWABLE_TABLET | Freq: Once | ORAL | Status: AC
  Administered 2023-06-11: 324 mg via ORAL
  Filled 2023-06-11: qty 4

## 2023-06-11 NOTE — Telephone Encounter (Signed)
Pt called to advise that she was going to [facility] ED to be evaluated.

## 2023-06-11 NOTE — ED Provider Notes (Signed)
[facility] Provider Note    Event Date/Time   First MD Initiated Contact with Patient 06/11/23 1751     (approximate)   History   Chief Complaint: Chest Pain   HPI  Donna Fuller is a 82 y.o. female with a history of hypertension diabetes CKD who comes to the ED complaining of chest pain.  Pain has been present off-and-on for the past several weeks.  Worse with walking, better with sitting down and resting.  Described as tightness in the center of the chest.  Nonradiating.  Associated with shortness of breath.  No diaphoresis or vomiting.  Also endorses increased orthopnea and episodes of PND.     Physical Exam   Triage Vital Signs: ED Triage Vitals  Encounter Vitals Group     BP 06/11/23 1454 136/67     Systolic BP Percentile --      Diastolic BP Percentile --      Pulse Rate 06/11/23 1454 62     Resp 06/11/23 1454 16     Temp 06/11/23 1454 97.7 F (36.5 C)     Temp Source 06/11/23 1454 Oral     SpO2 06/11/23 1454 100 %     Weight 06/11/23 1452 197 lb 5 oz (89.5 kg)     Height 06/11/23 1452 5\' 1"  (1.549 m)     Head Circumference --      Peak Flow --      Pain Score 06/11/23 1452 7     Pain Loc --      Pain Education --      Exclude from Growth Chart --     Most recent vital signs: Vitals:   06/11/23 2130 06/11/23 2204  BP: (!) 150/70   Pulse: 67   Resp: 18   Temp:  97.6 F (36.4 C)  SpO2: 100%     General: Awake, no distress.  CV:  Good peripheral perfusion.  Regular rate and rhythm Resp:  Normal effort.  Clear to auscultation bilaterally Abd:  No distention.  Soft nontender Other:  1+ pitting edema bilateral lower extremities   ED Results / Procedures / Treatments   Labs (all labs ordered are listed, but only abnormal results are displayed) Labs Reviewed  BASIC METABOLIC PANEL - Abnormal; Notable for the following components:      Result Value   Glucose, Bld 144 (*)    BUN 27 (*)    Creatinine, Ser 1.47 (*)    GFR,  Estimated 36 (*)    All other components within normal limits  CBC - Abnormal; Notable for the following components:   RBC 3.70 (*)    Hemoglobin 11.2 (*)    HCT 35.0 (*)    Platelets 138 (*)    All other components within normal limits  TROPONIN I (HIGH SENSITIVITY) - Abnormal; Notable for the following components:   Troponin I (High Sensitivity) 87 (*)    All other components within normal limits  TROPONIN I (HIGH SENSITIVITY) - Abnormal; Notable for the following components:   Troponin I (High Sensitivity) 94 (*)    All other components within normal limits     EKG Interpreted by me Sinus rhythm, rate of 65.  Right axis, first-degree AV block.  Poor R wave progression.  Normal ST segments and T waves.   RADIOLOGY Chest x-ray interpreted by me, appears unremarkable.  Radiology report reviewed.  Ultrasound bilateral lower extremities negative for DVT.   PROCEDURES:  Procedures   MEDICATIONS  ORDERED IN ED: Medications  aspirin chewable tablet 324 mg (324 mg Oral Given 06/11/23 1823)     IMPRESSION / MDM / ASSESSMENT AND PLAN / ED COURSE  I reviewed the triage vital signs and the nursing notes.  DDx: Non-STEMI, angina pectoris, AKI, electrolyte abnormality, anemia, DVT, pneumonia  Patient's presentation is most consistent with acute presentation with potential threat to life or bodily function.  Patient presents with worsening dyspnea on exertion and exertional chest pain over the past several weeks.  Currently pain-free at rest in the ED.  EKG is nonischemic.  Labs unremarkable.  Will need to hospitalize for further cardiac workup.  Case discussed with hospitalist.       FINAL CLINICAL IMPRESSION(S) / ED DIAGNOSES   Final diagnoses:  Chest pain with moderate risk for cardiac etiology  Type 2 diabetes mellitus without complication, unspecified whether long term insulin use (HCC)  Morbid obesity (HCC)     Rx / DC Orders   ED Discharge Orders     None         Note:  This document was prepared using Dragon voice recognition software and may include unintentional dictation errors.   Sharman Cheek, MD 06/11/23 2337

## 2023-06-11 NOTE — H&P (Signed)
History and Physical    Patient: Donna Fuller OZH:[id number] DOB: 1941/05/01 DOA: 06/11/2023 DOS: the patient was seen and examined on 06/12/2023 PCP: Esperanza Richters, PA-C  Patient coming from: Home  Chief Complaint:  Chief Complaint  Patient presents with   Chest Pain   HPI: Donna Fuller is a 82 y.o. female with medical history significant for type 2 diabetes, CKD, CAD, LVH, hypertension, who presents with chest pain.  Review of chart patient was admitted with chest pain in December 2022.  At that time she underwent a cardiac catheterization that showed no obstructive CAD.  Patient reports that earlier today she was at rest at home when she developed chest pain that was central and nonradiating that lasted about 5 minutes.  She also felt diaphoretic at the time, denied associated nausea.  She reports that the pain feels different than her prior admission, states that the prior pain was worse.  She also endorses feeling like she cannot lie down flat at night because she feels short of breath, and that this is new since her last admission.  She also reports waking up gasping for air in the middle of the night which is also new since her last admission.  She feels that her legs are more swollen, her left more so than her right.  In the ED vital signs notable for mild tachycardia but otherwise unremarkable.  CBC showed mild stable anemia and thrombocytopenia.  BMP showed CKD at baseline with creatinine 1.4, otherwise unremarkable.  Initial troponin 87, up trended to 94 on recheck, of note patient appears to have chronic troponinemia.  Chest x-ray showed mild cardiomegaly.  Lower extremity bilateral duplex showed no evidence of DVT.  EKG showed right axis deviation, nonspecific T wave changes, no ST segment deviations.  Compared to prior there were no significant changes.  She was given a full dose aspirin and admitted for further management.    Review of Systems: As mentioned in the  history of present illness. All other systems reviewed and are negative. Past Medical History:  Diagnosis Date   Arthritis    Breast cancer (HCC) 2007   Right breast, s/p mastectomy   Diabetes mellitus without complication (HCC)    GERD (gastroesophageal reflux disease)    Gout    Hyperlipidemia    Hypertension    Spinal stenosis    Past Surgical History:  Procedure Laterality Date   ABDOMINAL HYSTERECTOMY     ABDOMINAL SURGERY     BREAST BIOPSY Left    2016 benign    BREAST BIOPSY Left 02/16/2023   MM LT BREAST BX W LOC DEV 1ST LESION IMAGE BX SPEC STEREO GUIDE 02/16/2023 [facility]   LEFT HEART CATH AND CORONARY ANGIOGRAPHY N/A 11/21/2021   Procedure: LEFT HEART CATH AND CORONARY ANGIOGRAPHY;  Surgeon: Iran Ouch, MD;  Location: [facility] INVASIVE CV LAB;  Service: Cardiovascular;  Laterality: N/A;   MASTECTOMY     right side   REPLACEMENT TOTAL KNEE Bilateral    TUBAL LIGATION     Social History:  reports that she has quit smoking. Her smoking use included cigarettes. She has never been exposed to tobacco smoke. She has never used smokeless tobacco. She reports that she does not drink alcohol and does not use drugs.  Allergies  Allergen Reactions   Penicillins Swelling   Sulfa Antibiotics Swelling   Amlodipine Swelling    LE Swelling   Lisinopril Cough    Family History  Problem Relation Age of  Onset   Diabetes Mother    Alzheimer's disease Mother    Hypertension Mother    Hypertension Father    Heart disease Father    Hyperlipidemia Father    Kidney disease Father    Diabetes Sister    Cancer Brother        lung   Alzheimer's disease Brother    Heart disease Brother    Hypertension Brother    Diabetes Brother    Hypertension Brother    Diabetes Brother    Hyperlipidemia Son    Hypertension Son    Diabetes Son    Hypertension Daughter    Hyperlipidemia Daughter     Prior to Admission medications   Medication Sig Start Date End Date Taking?  Authorizing Provider  acetaminophen (TYLENOL) 650 MG CR tablet Take 650 mg by mouth 2 (two) times daily.    [provider]  albuterol (VENTOLIN HFA) 108 (90 Base) MCG/ACT inhaler INHALE 2 PUFFS BY MOUTH EVERY 6 HOURS AS NEEDED 11/09/22   Saguier, Ramon Dredge, PA-C  ascorbic acid (VITAMIN C) 500 MG tablet Take 500 mg by mouth daily.    [provider]  aspirin 81 MG tablet Take 81 mg by mouth daily.    [provider]  Biotin 1000 MCG CHEW Chew by mouth.    [provider]  blood glucose meter kit and supplies KIT Dispense based on patient and insurance preference. Use up to four times daily as directed. (FOR ICD-9 250.00, 250.01). 07/09/17   Everlene Other G, DO  brimonidine (ALPHAGAN) 0.2 % ophthalmic solution Place 1 drop into both eyes 2 (two) times a day. 05/08/19   [provider]  budesonide-formoterol (SYMBICORT) 160-4.5 MCG/ACT inhaler Inhale 2 puffs into the lungs 2 (two) times daily. 06/09/23   Saguier, Ramon Dredge, PA-C  Cholecalciferol (VITAMIN D-3) 5000 units TABS Take 1 tablet by mouth daily.    [provider]  dicyclomine (BENTYL) 10 MG capsule Take 1 capsule (10 mg total) by mouth 2 (two) times daily as needed for spasms. 08/22/22   Raspet, Erin K, PA-C  Dulaglutide (TRULICITY) 1.5 MG/0.5ML SOPN Inject 1.5 mg into the skin once a week. 04/30/23   Shamleffer, Konrad Dolores, MD  DULoxetine (CYMBALTA) 30 MG capsule TAKE 1 CAPSULE BY MOUTH EVERY DAY 05/31/23   Saguier, Ramon Dredge, PA-C  famotidine (PEPCID) 20 MG tablet TAKE 1 TABLET BY MOUTH EVERY DAY 04/27/23   Saguier, Ramon Dredge, PA-C  fluticasone [facility]) 50 MCG/ACT nasal spray SPRAY 2 SPRAYS INTO EACH NOSTRIL EVERY DAY 08/31/22   Saguier, Ramon Dredge, PA-C  gabapentin (NEURONTIN) 300 MG capsule TAKE 1 CAPSULE BY MOUTH THREE TIMES A DAY 03/11/23   Saguier, Ramon Dredge, PA-C  glipiZIDE (GLUCOTROL) 5 MG tablet Take 1 tablet (5 mg total) by mouth 2 (two) times daily before a meal. 04/21/23   Shamleffer, Konrad Dolores,  MD  ketorolac (ACULAR) 0.5 % ophthalmic solution INSTILL 1 DROP INTO RIGHT EYE 4 TIMES A DAY 04/07/22   Rankin, Alford Highland, MD  latanoprost (XALATAN) 0.005 % ophthalmic solution Place 1 drop into both eyes daily. 05/05/19   [provider]  levocetirizine (XYZAL) 5 MG tablet Take 1 tablet (5 mg total) by mouth every evening. 07/14/22   Saguier, Ramon Dredge, PA-C  losartan (COZAAR) 50 MG tablet Take 1 tablet (50 mg total) by mouth daily. 12/09/22   Saguier, Ramon Dredge, PA-C  metoprolol succinate (TOPROL-XL) 25 MG 24 hr tablet Take 1 tablet (25 mg total) by mouth daily. 12/09/22   Saguier, Ramon Dredge, PA-C  montelukast (SINGULAIR) 10 MG tablet Take 10 mg by mouth at bedtime.    [provider]  omeprazole (PRILOSEC) 40 MG capsule TAKE 1 CAPSULE BY MOUTH TWICE A DAY 05/03/23   Lemmon, Violet Baldy, PA  OneTouch Delica Lancets 33G MISC Use as instructed to check blood sugar 2-3 times a day.  DX E11.9 10/21/21   Shamleffer, Konrad Dolores, MD  ONETOUCH ULTRA test strip CHECK BLOOD SUGAR 2 TO 3 TIMES DAILY AS DIRECTED 12/07/22   Saguier, Ramon Dredge, PA-C  pravastatin (PRAVACHOL) 20 MG tablet TAKE 1 TABLET BY MOUTH ONCE DAILY Patient taking differently: Take 20 mg by mouth daily. 09/27/17   Glori Luis, MD  sucralfate (CARAFATE) 1 g tablet TAKE 1 TABLET (1 G TOTAL) BY MOUTH 4 TIMES A DAY WITH MEALS AND AT BEDTIME 09/30/22   Saguier, Ramon Dredge, PA-C  timolol (TIMOPTIC) 0.5 % ophthalmic solution Place 1 drop into both eyes 2 (two) times a day. 03/15/19   [provider]  torsemide (DEMADEX) 10 MG tablet Take 1 tablet (10 mg total) by mouth daily. 06/09/23   Saguier, Ramon Dredge, PA-C  torsemide (DEMADEX) 5 MG tablet Take 5 mg by mouth daily. 07/23/22   [provider]    Physical Exam: Vitals:   06/11/23 2030 06/11/23 2100 06/11/23 2130 06/11/23 2204  BP: (!) 141/79 138/78 (!) 150/70   Pulse: 61 61 67   Resp: 14 13 18    Temp:    97.6 F (36.4 C)  TempSrc:    Oral  SpO2: 100% 100% 100%   Weight:       Height:       Physical Exam Constitutional:      General: She is not in acute distress.    Appearance: She is well-developed. She is obese. She is not ill-appearing, toxic-appearing or diaphoretic.  HENT:     Head: Normocephalic and atraumatic.  Cardiovascular:     Rate and Rhythm: Normal rate and regular rhythm.     Heart sounds: Normal heart sounds.  Pulmonary:     Effort: Pulmonary effort is normal. No respiratory distress.     Breath sounds: Examination of the right-lower field reveals decreased breath sounds and rales. Examination of the left-lower field reveals decreased breath sounds and rales. Decreased breath sounds and rales present.  Abdominal:     General: Bowel sounds are normal.     Palpations: Abdomen is soft.  Musculoskeletal:     Right lower leg: Edema present.     Left lower leg: Edema present.     Comments: 1+ lower extremity edema bilaterally  Skin:    General: Skin is warm and dry.  Neurological:     General: No focal deficit present.     Mental Status: She is alert.  Psychiatric:        Mood and Affect: Mood normal.        Behavior: Behavior normal.      Data Reviewed: Results for orders placed or performed during the hospital encounter of 06/11/23 (from the past 24 hour(s))  Basic metabolic panel     Status: Abnormal   Collection Time: 06/11/23  2:53 PM  Result Value Ref Range   Sodium 140 135 - 145 mmol/L   Potassium 4.7 3.5 - 5.1 mmol/L   Chloride 106 98 - 111 mmol/L   CO2 25 22 - 32 mmol/L   Glucose, Bld 144 (H) 70 - 99 mg/dL   BUN 27 (H) 8 - 23 mg/dL   Creatinine, Ser 1.61 (  H) 0.44 - 1.00 mg/dL   Calcium 9.3 8.9 - 16.1 mg/dL   GFR, Estimated 36 (L) >60 mL/min   Anion gap 9 5 - 15  CBC     Status: Abnormal   Collection Time: 06/11/23  2:53 PM  Result Value Ref Range   WBC 5.6 4.0 - 10.5 K/uL   RBC 3.70 (L) 3.87 - 5.11 MIL/uL   Hemoglobin 11.2 (L) 12.0 - 15.0 g/dL   HCT 09.6 (L) 04.5 - 40.9 %   MCV 94.6 80.0 - 100.0 fL   MCH 30.3  26.0 - 34.0 pg   MCHC 32.0 30.0 - 36.0 g/dL   RDW 81.1 91.4 - 78.2 %   Platelets 138 (L) 150 - 400 K/uL   nRBC 0.0 0.0 - 0.2 %  Troponin I (High Sensitivity)     Status: Abnormal   Collection Time: 06/11/23  2:53 PM  Result Value Ref Range   Troponin I (High Sensitivity) 87 (H) <18 ng/L  Troponin I (High Sensitivity)     Status: Abnormal   Collection Time: 06/11/23  5:57 PM  Result Value Ref Range   Troponin I (High Sensitivity) 94 (H) <18 ng/L   US Venous Img Lower Bilateral CLINICAL DATA:  Peripheral edema  EXAM: BILATERAL LOWER EXTREMITY VENOUS DOPPLER ULTRASOUND  TECHNIQUE: Gray-scale sonography with compression, as well as color and duplex ultrasound, were performed to evaluate the deep venous system(s) from the level of the common femoral vein through the popliteal and proximal calf veins.  COMPARISON:  Lower extremity ultrasound on 07/22  FINDINGS: VENOUS  Normal compressibility of the common femoral, superficial femoral, and popliteal veins, as well as the visualized calf veins. Visualized portions of profunda femoral vein and great saphenous vein unremarkable. No filling defects to suggest DVT on grayscale or color Doppler imaging. Doppler waveforms show normal direction of venous flow, normal respiratory plasticity and response to augmentation.  Limited views of the contralateral common femoral vein are unremarkable.  OTHER  Subcutaneous edema in lower extremities.  Limitations: none  IMPRESSION: Negative for acute DVT in the bilateral lower extremities.  Electronically Signed   By: Minerva Fester M.D.   On: 06/11/2023 19:58 DG Chest 2 View CLINICAL DATA:  Chest pain  EXAM: CHEST - 2 VIEW  COMPARISON:  06/09/2023, CT 11/20/2021  FINDINGS: Mild cardiomegaly. No pleural effusion. Central airways thickening without focal airspace disease. Linear artifact over the right supraclavicular region. Aortic atherosclerosis  IMPRESSION: Mild  cardiomegaly. Central airways thickening without focal airspace disease.  Electronically Signed   By: Jasmine Pang M.D.   On: 06/11/2023 15:38   Assessment and Plan: IVI STANBRO is a 82 y.o. female with medical history significant for type 2 diabetes, CKD, CAD, LVH, hypertension, who presents with chest pain.   Chest pain Patient presenting with chest pain in setting of multiple comorbidities.  On my interview sounded like only 1 episode, though when discussing with ED provider sounded as if this has been progressive and recurrent over the past few weeks.  In addition she has some symptoms suggestive of CHF which are new since her last evaluation.  That being said cardiac evaluation 2 years prior was very reassuring with no evidence of obstructive CAD.  Plan to consult cardiology in a.m. to see if patient warrants stress testing. - Telemetry - Repeat echocardiogram - Trend troponin to peak - Consult cardiology in a.m.  Elevated troponin Mildly increased on repeat, will trend to peak.  Appears to have somewhat of  a chronic troponinemia which her CKD is also likely contributing to.  Known medical problems CVD-continue aspirin Glaucoma-continue brimonidine, latanoprost, timolol Neuropathy-continue gabapentin Hypertension-continue losartan, metoprolol GERD-continue PPI Hyperlipidemia-continue pravastatin DM2-hold Trulicity and glipizide.  Sliding scale insulin correction factor     Advance Care Planning:   Code Status: DNR , confirmed with patient in detail. She does not have a MOST form, encouraged her to complete one  Consults: Cardiology in AM  Family Communication: daughters updated at bedside  Severity of Illness: The appropriate patient status for this patient is OBSERVATION. Observation status is judged to be reasonable and necessary in order to provide the required intensity of service to ensure the patient's safety. The patient's presenting symptoms, physical exam  findings, and initial radiographic and laboratory data in the context of their medical condition is felt to place them at decreased risk for further clinical deterioration. Furthermore, it is anticipated that the patient will be medically stable for discharge from the hospital within 2 midnights of admission.   Author: Venora Maples, MD 06/12/2023 12:29 AM  For on call review [email or web address].

## 2023-06-11 NOTE — ED Triage Notes (Signed)
Pt here with cp that has been ongoing for weeks but getting worse. Pt states pain is centered and does not radiate. Pt describes pain as tightness. Pt endorses nausea but no vomiting or diarrhea.

## 2023-06-11 NOTE — Telephone Encounter (Signed)
FYI   Pt has an appt on 06/14/23

## 2023-06-11 NOTE — ED Notes (Addendum)
Pt moved to CPOD admission holding area at this time via wheelchair. Pt ambulatory. Nonpitting edema in lower extremities noted bilaterally. Pt AOX4, denies acute pain at this time, remains on room air. No visitors w/ pt currently. Belongings secured in bag at bedside.

## 2023-06-12 ENCOUNTER — Encounter: Payer: Self-pay | Admitting: Family Medicine

## 2023-06-12 DIAGNOSIS — Z789 Other specified health status: Secondary | ICD-10-CM | POA: Insufficient documentation

## 2023-06-12 DIAGNOSIS — R079 Chest pain, unspecified: Secondary | ICD-10-CM | POA: Diagnosis not present

## 2023-06-12 DIAGNOSIS — I5033 Acute on chronic diastolic (congestive) heart failure: Secondary | ICD-10-CM

## 2023-06-12 DIAGNOSIS — R7989 Other specified abnormal findings of blood chemistry: Secondary | ICD-10-CM | POA: Diagnosis not present

## 2023-06-12 DIAGNOSIS — I10 Essential (primary) hypertension: Secondary | ICD-10-CM | POA: Diagnosis not present

## 2023-06-12 DIAGNOSIS — N1832 Chronic kidney disease, stage 3b: Secondary | ICD-10-CM | POA: Diagnosis not present

## 2023-06-12 LAB — COMPREHENSIVE METABOLIC PANEL
ALT: 18 U/L (ref 0–44)
AST: 19 U/L (ref 15–41)
Albumin: 3.7 g/dL (ref 3.5–5.0)
Alkaline Phosphatase: 97 U/L (ref 38–126)
Anion gap: 8 (ref 5–15)
BUN: 25 mg/dL — ABNORMAL HIGH (ref 8–23)
CO2: 22 mmol/L (ref 22–32)
Calcium: 9 mg/dL (ref 8.9–10.3)
Chloride: 111 mmol/L (ref 98–111)
Creatinine, Ser: 1.28 mg/dL — ABNORMAL HIGH (ref 0.44–1.00)
GFR, Estimated: 42 mL/min — ABNORMAL LOW (ref >60)
Glucose, Bld: 88 mg/dL (ref 70–99)
Potassium: 4.2 mmol/L (ref 3.5–5.1)
Sodium: 141 mmol/L (ref 135–145)
Total Bilirubin: 0.7 mg/dL (ref 0.3–1.2)
Total Protein: 6.6 g/dL (ref 6.5–8.1)

## 2023-06-12 LAB — CBC
HCT: 33.4 % — ABNORMAL LOW (ref 36.0–46.0)
Hemoglobin: 11 g/dL — ABNORMAL LOW (ref 12.0–15.0)
MCH: 30.4 pg (ref 26.0–34.0)
MCHC: 32.9 g/dL (ref 30.0–36.0)
MCV: 92.3 fL (ref 80.0–100.0)
Platelets: 130 10*3/uL — ABNORMAL LOW (ref 150–400)
RBC: 3.62 MIL/uL — ABNORMAL LOW (ref 3.87–5.11)
RDW: 15.3 % (ref 11.5–15.5)
WBC: 6.3 10*3/uL (ref 4.0–10.5)
nRBC: 0 % (ref 0.0–0.2)

## 2023-06-12 LAB — TROPONIN I (HIGH SENSITIVITY)
Troponin I (High Sensitivity): 88 ng/L — ABNORMAL HIGH (ref <18)
Troponin I (High Sensitivity): 82 ng/L — ABNORMAL HIGH (ref <18)

## 2023-06-12 LAB — GLUCOSE, CAPILLARY
Glucose-Capillary: 118 mg/dL — ABNORMAL HIGH (ref 70–99)
Glucose-Capillary: 201 mg/dL — ABNORMAL HIGH (ref 70–99)

## 2023-06-12 LAB — CBG MONITORING, ED
Glucose-Capillary: 105 mg/dL — ABNORMAL HIGH (ref 70–99)
Glucose-Capillary: 135 mg/dL — ABNORMAL HIGH (ref 70–99)

## 2023-06-12 LAB — BRAIN NATRIURETIC PEPTIDE: B Natriuretic Peptide: 593.3 pg/mL — ABNORMAL HIGH (ref 0.0–100.0)

## 2023-06-12 MED ORDER — PRAVASTATIN SODIUM 20 MG PO TABS
20.0000 mg | ORAL_TABLET | Freq: Every day | ORAL | Status: DC
  Administered 2023-06-12: 20 mg via ORAL
  Filled 2023-06-12: qty 1

## 2023-06-12 MED ORDER — SODIUM CHLORIDE 0.9% FLUSH
3.0000 mL | Freq: Two times a day (BID) | INTRAVENOUS | Status: DC
  Administered 2023-06-12 – 2023-06-13 (×2): 3 mL via INTRAVENOUS

## 2023-06-12 MED ORDER — LOSARTAN POTASSIUM 50 MG PO TABS
50.0000 mg | ORAL_TABLET | Freq: Every day | ORAL | Status: DC
  Administered 2023-06-12 – 2023-06-13 (×2): 50 mg via ORAL
  Filled 2023-06-12 (×2): qty 1

## 2023-06-12 MED ORDER — POLYETHYLENE GLYCOL 3350 17 G PO PACK: 17.0000 g | PACK | Freq: Every day | ORAL | Status: DC | PRN

## 2023-06-12 MED ORDER — LORATADINE 10 MG PO TABS
10.0000 mg | ORAL_TABLET | Freq: Every evening | ORAL | Status: DC
  Administered 2023-06-12: 10 mg via ORAL
  Filled 2023-06-12: qty 1

## 2023-06-12 MED ORDER — PANTOPRAZOLE SODIUM 40 MG PO TBEC
80.0000 mg | DELAYED_RELEASE_TABLET | Freq: Every day | ORAL | Status: DC
  Administered 2023-06-12 – 2023-06-13 (×2): 80 mg via ORAL
  Filled 2023-06-12 (×2): qty 2

## 2023-06-12 MED ORDER — ONDANSETRON HCL 4 MG/2ML IJ SOLN: 4.0000 mg | Freq: Four times a day (QID) | INTRAMUSCULAR | Status: DC | PRN

## 2023-06-12 MED ORDER — LATANOPROST 0.005 % OP SOLN
1.0000 [drp] | Freq: Every day | OPHTHALMIC | Status: DC
  Administered 2023-06-12: 1 [drp] via OPHTHALMIC
  Filled 2023-06-12: qty 2.5

## 2023-06-12 MED ORDER — ASPIRIN 81 MG PO TBEC
81.0000 mg | DELAYED_RELEASE_TABLET | Freq: Every day | ORAL | Status: DC
  Administered 2023-06-12 – 2023-06-13 (×2): 81 mg via ORAL
  Filled 2023-06-12 (×2): qty 1

## 2023-06-12 MED ORDER — INSULIN ASPART 100 UNIT/ML IJ SOLN
0.0000 [IU] | Freq: Three times a day (TID) | INTRAMUSCULAR | Status: DC
  Administered 2023-06-13: 1 [IU] via SUBCUTANEOUS
  Filled 2023-06-12: qty 1

## 2023-06-12 MED ORDER — ONDANSETRON HCL 4 MG PO TABS: 4.0000 mg | ORAL_TABLET | Freq: Four times a day (QID) | ORAL | Status: DC | PRN

## 2023-06-12 MED ORDER — TRAZODONE HCL 50 MG PO TABS: 50.0000 mg | ORAL_TABLET | Freq: Every evening | ORAL | Status: DC | PRN

## 2023-06-12 MED ORDER — CARVEDILOL 6.25 MG PO TABS
12.5000 mg | ORAL_TABLET | Freq: Two times a day (BID) | ORAL | Status: DC
  Administered 2023-06-12 – 2023-06-13 (×2): 12.5 mg via ORAL
  Filled 2023-06-12 (×2): qty 2

## 2023-06-12 MED ORDER — ACETAMINOPHEN 650 MG RE SUPP: 650.0000 mg | Freq: Four times a day (QID) | RECTAL | Status: DC | PRN

## 2023-06-12 MED ORDER — GABAPENTIN 300 MG PO CAPS
300.0000 mg | ORAL_CAPSULE | Freq: Three times a day (TID) | ORAL | Status: DC
  Administered 2023-06-12 – 2023-06-13 (×4): 300 mg via ORAL
  Filled 2023-06-12 (×4): qty 1

## 2023-06-12 MED ORDER — METOPROLOL SUCCINATE ER 25 MG PO TB24
25.0000 mg | ORAL_TABLET | Freq: Every day | ORAL | Status: DC
  Administered 2023-06-12: 25 mg via ORAL
  Filled 2023-06-12: qty 1

## 2023-06-12 MED ORDER — TIMOLOL MALEATE 0.5 % OP SOLN
1.0000 [drp] | Freq: Two times a day (BID) | OPHTHALMIC | Status: DC
  Administered 2023-06-12 – 2023-06-13 (×2): 1 [drp] via OPHTHALMIC
  Filled 2023-06-12: qty 5

## 2023-06-12 MED ORDER — BRIMONIDINE TARTRATE 0.2 % OP SOLN
1.0000 [drp] | Freq: Three times a day (TID) | OPHTHALMIC | Status: DC
  Administered 2023-06-12 – 2023-06-13 (×2): 1 [drp] via OPHTHALMIC
  Filled 2023-06-12: qty 5

## 2023-06-12 MED ORDER — KETOROLAC TROMETHAMINE 0.5 % OP SOLN
1.0000 [drp] | Freq: Two times a day (BID) | OPHTHALMIC | Status: DC
  Administered 2023-06-12 – 2023-06-13 (×2): 1 [drp] via OPHTHALMIC
  Filled 2023-06-12: qty 3

## 2023-06-12 MED ORDER — OXYCODONE HCL 5 MG PO TABS: 5.0000 mg | ORAL_TABLET | ORAL | Status: DC | PRN

## 2023-06-12 MED ORDER — ACETAMINOPHEN 325 MG PO TABS
650.0000 mg | ORAL_TABLET | Freq: Four times a day (QID) | ORAL | Status: DC | PRN
  Administered 2023-06-12: 650 mg via ORAL
  Filled 2023-06-12: qty 2

## 2023-06-12 MED ORDER — TORSEMIDE 20 MG PO TABS
20.0000 mg | ORAL_TABLET | Freq: Every day | ORAL | Status: DC
  Administered 2023-06-12 – 2023-06-13 (×2): 20 mg via ORAL
  Filled 2023-06-12 (×2): qty 1

## 2023-06-12 NOTE — Consult Note (Signed)
Cardiology Consultation   Patient ID: Donna Fuller MRN: [id number]; DOB: 1941-04-23  Admit date: 06/11/2023 Date of Consult: 06/12/2023  PCP:  Marisue Brooklyn   [facility] Providers Cardiologist: Dr Benard Rink here to update MD or APP on Care Team, Refresh:1}     Patient Profile:   Donna Fuller is a 82 y.o. female with a hx of nonobstructive coronary artery disease on left heart catheterization in 2022, type 2 diabetes, CKD,'s atypical lobular hyperplasia of the left breast, ductal carcinoma of the right breast status postmastectomy (2007), osteoarthritis, anemia, hypertension, who is being seen 06/12/2023 for the evaluation of chest pain at the request of Dr. Idelle Leech.  History of Present Illness:   Donna Fuller presented to the [facility] emergency department on 06/11/2023 with chest pain that have been ongoing for weeks but is gradually getting worse.  She stated that the pain is substernal and does not radiate.  She describes it as a tightness.  And she endorses nausea without vomiting or diarrhea or shortness of breath associated with her chest discomfort.  Prior to coming into the patient stated that she was resting at home when she developed chest pain.  She also felt diaphoretic at the time.  She reported the pain feels different than prior admission.  She also endorses feeling she cannot lie flat at night because she feels so short of breath but this is new since last time she was hospitalized.  She also reports waking up gasping for air in the middle of the night which is also new since her last admission.  She feels that her legs are more swollen, and thinks that her left more so than her right.She stated that she had fallen a few days before the pain started. She had seen her PCP who advised her to follow up at the emergency department for her various concerns and to be monitored over the weekend.  Patient was also admitted to the hospital December 2022 for chest  pain and cough.  At that time she underwent diagnostic cardiac catheterization that showed nonobstructive coronary artery disease with 1 area of 20% stenosis.  Echocardiogram revealed LVEF of 60-65%, no regional wall motion abnormalities, and trivial mitral valve regurg.  Initial vital signs: Blood pressure 136/67, pulse 62, respirations 16, temperature 97.7  Pertinent labs: Blood glucose 144, BUN 27, serum creatinine 1.47, GFR 36, hemoglobin 11.2, hematocrit 35.0, high-sensitivity troponin 87, and 94  Imaging: Chest x-ray was unremarkable, ultrasound bilateral lower extremities negative for DVT  Medications administered in the emergency department were aspirin 324 mg  Cardiology was consulted for the evaluation of chest pain   Past Medical History:  Diagnosis Date   Arthritis    Breast cancer (HCC) 2007   Right breast, s/p mastectomy   Diabetes mellitus without complication (HCC)    GERD (gastroesophageal reflux disease)    Gout    Hyperlipidemia    Hypertension    Spinal stenosis     Past Surgical History:  Procedure Laterality Date   ABDOMINAL HYSTERECTOMY     ABDOMINAL SURGERY     BREAST BIOPSY Left    2016 benign    BREAST BIOPSY Left 02/16/2023   MM LT BREAST BX W LOC DEV 1ST LESION IMAGE BX SPEC STEREO GUIDE 02/16/2023 [facility] MAMMOGRAPHY   LEFT HEART CATH AND CORONARY ANGIOGRAPHY N/A 11/21/2021   Procedure: LEFT HEART CATH AND CORONARY ANGIOGRAPHY;  Surgeon: Iran Ouch, MD;  Location: [facility] INVASIVE CV LAB;  Service:  Cardiovascular;  Laterality: N/A;   MASTECTOMY     right side   REPLACEMENT TOTAL KNEE Bilateral    TUBAL LIGATION       Home Medications:  Prior to Admission medications   Medication Sig Start Date End Date Taking? Authorizing Provider  acetaminophen (TYLENOL) 650 MG CR tablet Take 650 mg by mouth 2 (two) times daily.   Yes [provider]  albuterol (VENTOLIN HFA) 108 (90 Base) MCG/ACT inhaler INHALE 2 PUFFS BY MOUTH EVERY 6 HOURS AS  NEEDED 11/09/22  Yes Saguier, Ramon Dredge, PA-C  ascorbic acid (VITAMIN C) 500 MG tablet Take 500 mg by mouth daily.   Yes [provider]  aspirin 81 MG tablet Take 81 mg by mouth daily.   Yes [provider]  blood glucose meter kit and supplies KIT Dispense based on patient and insurance preference. Use up to four times daily as directed. (FOR ICD-9 250.00, 250.01). 07/09/17  Yes Cook, Jayce G, DO  brimonidine (ALPHAGAN) 0.2 % ophthalmic solution Place 1 drop into both eyes 3 (three) times daily. 05/08/19  Yes [provider]  Cholecalciferol (VITAMIN D-3) 5000 units TABS Take 1 tablet by mouth daily.   Yes [provider]  Dulaglutide (TRULICITY) 1.5 MG/0.5ML SOPN Inject 1.5 mg into the skin once a week. 04/30/23  Yes Shamleffer, Konrad Dolores, MD  gabapentin (NEURONTIN) 300 MG capsule TAKE 1 CAPSULE BY MOUTH THREE TIMES A DAY 03/11/23  Yes Saguier, Ramon Dredge, PA-C  glipiZIDE (GLUCOTROL) 5 MG tablet Take 1 tablet (5 mg total) by mouth 2 (two) times daily before a meal. 04/21/23  Yes Shamleffer, Konrad Dolores, MD  ketorolac (ACULAR) 0.5 % ophthalmic solution INSTILL 1 DROP INTO RIGHT EYE 4 TIMES A DAY Patient taking differently: Place 1 drop into both eyes 2 (two) times daily. INSTILL 1 DROP INTO RIGHT EYE 4 TIMES A DAY 04/07/22  Yes Rankin, Alford Highland, MD  latanoprost (XALATAN) 0.005 % ophthalmic solution Place 1 drop into both eyes daily. 05/05/19  Yes [provider]  levocetirizine (XYZAL) 5 MG tablet Take 1 tablet (5 mg total) by mouth every evening. 07/14/22  Yes Saguier, Ramon Dredge, PA-C  losartan (COZAAR) 50 MG tablet Take 1 tablet (50 mg total) by mouth daily. 12/09/22  Yes Saguier, Ramon Dredge, PA-C  metoprolol succinate (TOPROL-XL) 25 MG 24 hr tablet Take 1 tablet (25 mg total) by mouth daily. 12/09/22  Yes Saguier, Ramon Dredge, PA-C  omeprazole (PRILOSEC) 40 MG capsule TAKE 1 CAPSULE BY MOUTH TWICE A DAY 05/03/23  Yes Lemmon, Violet Baldy, PA  OneTouch Delica Lancets 33G MISC  Use as instructed to check blood sugar 2-3 times a day.  DX E11.9 10/21/21  Yes Shamleffer, Konrad Dolores, MD  ONETOUCH ULTRA test strip CHECK BLOOD SUGAR 2 TO 3 TIMES DAILY AS DIRECTED 12/07/22  Yes Saguier, Ramon Dredge, PA-C  pravastatin (PRAVACHOL) 20 MG tablet TAKE 1 TABLET BY MOUTH ONCE DAILY Patient taking differently: Take 20 mg by mouth daily. 09/27/17  Yes Glori Luis, MD  timolol (TIMOPTIC) 0.5 % ophthalmic solution Place 1 drop into both eyes 2 (two) times a day. 03/15/19  Yes [provider]  Biotin 1000 MCG CHEW Chew by mouth. Patient not taking: Reported on 06/11/2023    [provider]  budesonide-formoterol (SYMBICORT) 160-4.5 MCG/ACT inhaler Inhale 2 puffs into the lungs 2 (two) times daily. Patient not taking: Reported on 06/11/2023 06/09/23   Saguier, Ramon Dredge, PA-C  dicyclomine (BENTYL) 10 MG capsule Take 1 capsule (10 mg total) by mouth 2 (  two) times daily as needed for spasms. Patient not taking: Reported on 06/11/2023 08/22/22   Raspet, Noberto Retort, PA-C  DULoxetine (CYMBALTA) 30 MG capsule TAKE 1 CAPSULE BY MOUTH EVERY DAY Patient not taking: Reported on 06/11/2023 05/31/23   Saguier, Ramon Dredge, PA-C  famotidine (PEPCID) 20 MG tablet TAKE 1 TABLET BY MOUTH EVERY DAY Patient not taking: Reported on 06/11/2023 04/27/23   Saguier, Ramon Dredge, PA-C  fluticasone [facility]) 50 MCG/ACT nasal spray SPRAY 2 SPRAYS INTO EACH NOSTRIL EVERY DAY Patient not taking: Reported on 06/11/2023 08/31/22   Saguier, Ramon Dredge, PA-C  montelukast (SINGULAIR) 10 MG tablet Take 10 mg by mouth at bedtime. Patient not taking: Reported on 06/11/2023    [provider]  sucralfate (CARAFATE) 1 g tablet TAKE 1 TABLET (1 G TOTAL) BY MOUTH 4 TIMES A DAY WITH MEALS AND AT BEDTIME Patient not taking: Reported on 06/11/2023 09/30/22   Saguier, Ramon Dredge, PA-C  torsemide (DEMADEX) 10 MG tablet Take 1 tablet (10 mg total) by mouth daily. Patient not taking: Reported on 06/11/2023 06/09/23   Saguier, Ramon Dredge, PA-C   torsemide (DEMADEX) 5 MG tablet Take 5 mg by mouth daily. Patient not taking: Reported on 06/11/2023 07/23/22   [provider]    Inpatient Medications: Scheduled Meds:  aspirin EC  81 mg Oral Daily   brimonidine  1 drop Both Eyes TID   gabapentin  300 mg Oral TID   insulin aspart  0-6 Units Subcutaneous TID WC   ketorolac  1 drop Both Eyes BID   latanoprost  1 drop Both Eyes QHS   loratadine  10 mg Oral QPM   losartan  50 mg Oral Daily   metoprolol succinate  25 mg Oral Daily   pantoprazole  80 mg Oral Daily   pravastatin  20 mg Oral q1800   sodium chloride flush  3 mL Intravenous Q12H   timolol  1 drop Both Eyes BID   Continuous Infusions:  PRN Meds: acetaminophen **OR** acetaminophen, ondansetron **OR** ondansetron (ZOFRAN) IV, oxyCODONE, polyethylene glycol, traZODone  Allergies:    Allergies  Allergen Reactions   Penicillins Swelling   Sulfa Antibiotics Swelling   Amlodipine Swelling    LE Swelling   Lisinopril Cough    Social History:   Social History   Socioeconomic History   Marital status: Married    Spouse name: Jimmy   Number of children: 3   Years of education: 12   Highest education level: Not on file  Occupational History   Not on file  Tobacco Use   Smoking status: Former    Types: Cigarettes    Passive exposure: Never   Smokeless tobacco: Never  Vaping Use   Vaping status: Never Used  Substance and Sexual Activity   Alcohol use: No   Drug use: No   Sexual activity: Not Currently  Other Topics Concern   Not on file  Social History Narrative   Marital Status:  Married Hydrologist)    Children:  Daughter(1) Son (1)    Pets:  None    Living Situation: Lives with husband and daughter.     Occupation:  Retired Contractor)    Education: 12th Grade    Tobacco Use/Exposure:  She used to smoke socially during the weekends but quit 40 years ago.    Alcohol Use:  Occasional   Drug Use:  None   Diet:  Regular   Exercise:  None   Hobbies:   Reading and playing volleyball  Social Determinants of Health   Financial Resource Strain: Low Risk  (10/01/2021)   Overall Financial Resource Strain (CARDIA)    Difficulty of Paying Living Expenses: Not hard at all  Food Insecurity: No Food Insecurity (10/01/2021)   Hunger Vital Sign    Worried About Running Out of Food in the Last Year: Never true    Ran Out of Food in the Last Year: Never true  Transportation Needs: No Transportation Needs (10/01/2021)   PRAPARE - Administrator, Civil Service (Medical): No    Lack of Transportation (Non-Medical): No  Physical Activity: Inactive (10/01/2021)   Exercise Vital Sign    Days of Exercise per Week: 0 days    Minutes of Exercise per Session: 0 min  Stress: No Stress Concern Present (10/01/2021)   [employer] of Occupational Health - Occupational Stress Questionnaire    Feeling of Stress : Not at all  Social Connections: Moderately Integrated (10/01/2021)   Social Connection and Isolation Panel [NHANES]    Frequency of Communication with Friends and Family: More than three times a week    Frequency of Social Gatherings with Friends and Family: More than three times a week    Attends Religious Services: More than 4 times per year    Active Member of [employer] or Organizations: No    Attends Banker Meetings: Never    Marital Status: Married  Catering manager Violence: Not At Risk (10/01/2021)   Humiliation, Afraid, Rape, and Kick questionnaire    Fear of Current or Ex-Partner: No    Emotionally Abused: No    Physically Abused: No    Sexually Abused: No    Family History:    Family History  Problem Relation Age of Onset   Diabetes Mother    Alzheimer's disease Mother    Hypertension Mother    Hypertension Father    Heart disease Father    Hyperlipidemia Father    Kidney disease Father    Diabetes Sister    Cancer Brother        lung   Alzheimer's disease Brother    Heart disease Brother     Hypertension Brother    Diabetes Brother    Hypertension Brother    Diabetes Brother    Hyperlipidemia Son    Hypertension Son    Diabetes Son    Hypertension Daughter    Hyperlipidemia Daughter      ROS:  Please see the history of present illness.  Review of Systems  Constitutional:  Positive for malaise/fatigue.  Respiratory:  Positive for shortness of breath.   Cardiovascular:  Positive for chest pain and leg swelling.  Gastrointestinal:  Positive for nausea.  Musculoskeletal:  Positive for falls.  Neurological:  Positive for weakness.    All other ROS reviewed and negative.     Physical Exam/Data:   Vitals:   06/12/23 0300 06/12/23 0500 06/12/23 0828 06/12/23 1017  BP: 138/73 (!) 149/69 (!) 153/79 (!) 157/76  Pulse: 64 68 79 84  Resp: 16 (!) 23 20   Temp:   98.8 F (37.1 C)   TempSrc:   Oral   SpO2: 98% 94% 100%   Weight:      Height:       No intake or output data in the 24 hours ending 06/12/23 1154    06/11/2023    2:52 PM 06/09/2023    3:57 PM 05/31/2023    5:37 PM  Last 3 Weights  Weight (lbs) 197  lb 5 oz 197 lb 6.4 oz 193 lb 4.8 oz  Weight (kg) 89.5 kg 89.54 kg 87.68 kg     Body mass index is 37.28 kg/m.  General:  Well nourished, well developed, in no acute distress HEENT: normal Neck: no JVD Vascular: No carotid bruits; Distal pulses 2+ bilaterally Cardiac:  normal S1, S2; RRR; no murmur  Lungs: Diminished with bibasilar crackles to auscultation bilaterally, respirations are unlabored on room air Abd: soft, nontender, obese, no hepatomegaly  Ext: 1+ edema Musculoskeletal:  No deformities, BUE and BLE strength normal and equal Skin: warm and dry  Neuro:  CNs 2-12 intact, no focal abnormalities noted Psych:  Normal affect   EKG:  The EKG was personally reviewed and demonstrates: Sinus rhythm with a rate of 68 first-degree AV block with nonspecific T wave abnormalities Telemetry:  Telemetry was personally reviewed and demonstrates: Not currently  on telemetry monitoring  Relevant CV Studies:  [facility] 11/21/21   Mid LAD lesion is 20% stenosed.   LV end diastolic pressure is mildly elevated.   1.  Mild nonobstructive coronary artery disease. 2.  Left ventricular angiography was not performed.  EF was normal by echo. 3.  Mildly elevated left ventricular end-diastolic pressure at 18 mmHg.   Recommendations: No culprit is identified for patient's symptoms and mildly elevated troponin.    TTE 11/21/21 1. Left ventricular ejection fraction, by estimation, is 60 to 65%. The  left ventricle has normal function. The left ventricle has no regional  wall motion abnormalities. There is moderate left ventricular hypertrophy.  Left ventricular diastolic  parameters are consistent with Grade II diastolic dysfunction  (pseudonormalization). The average left ventricular global longitudinal  strain is -13.6 %. The global longitudinal strain is abnormal.   2. Right ventricular systolic function is normal. The right ventricular  size is normal. Tricuspid regurgitation signal is inadequate for assessing  PA pressure.   3. The mitral valve is normal in structure. Trivial mitral valve  regurgitation. No evidence of mitral stenosis.   4. The aortic valve is tricuspid. Aortic valve regurgitation is not  visualized. No aortic stenosis is present.   5. The inferior vena cava is normal in size with greater than 50%  respiratory variability, suggesting right atrial pressure of 3 mmHg.    Laboratory Data:  High Sensitivity Troponin:   Recent Labs  Lab 06/11/23 1453 06/11/23 1757 06/12/23 0030 06/12/23 0620  TROPONINIHS 87* 94* 88* 82*     Chemistry Recent Labs  Lab 06/10/23 1544 06/11/23 1453 06/12/23 0620  NA 137 140 141  K 5.0 4.7 4.2  CL 105 106 111  CO2 27 25 22   GLUCOSE 167* 144* 88  BUN 26* 27* 25*  CREATININE 1.74* 1.47* 1.28*  CALCIUM 9.8 9.3 9.0  GFRNONAA  --  36* 42*  ANIONGAP  --  9 8    Recent Labs  Lab 06/10/23 1544  06/12/23 0620  PROT 7.2 6.6  ALBUMIN 4.1 3.7  AST 19 19  ALT 17 18  ALKPHOS 113 97  BILITOT 0.7 0.7   Lipids No results for input(s): "CHOL", "TRIG", "HDL", "LABVLDL", "LDLCALC", "CHOLHDL" in the last 168 hours.  Hematology Recent Labs  Lab 06/10/23 1544 06/11/23 1453 06/12/23 0620  WBC 6.4 5.6 6.3  RBC 3.81* 3.70* 3.62*  HGB 11.6* 11.2* 11.0*  HCT 35.6* 35.0* 33.4*  MCV 93.5 94.6 92.3  MCH  --  30.3 30.4  MCHC 32.5 32.0 32.9  RDW 15.8* 15.4 15.3  PLT 139.0* 138*  130*   Thyroid No results for input(s): "TSH", "FREET4" in the last 168 hours.  BNP Recent Labs  Lab 06/10/23 1544 06/11/23 1453  BNP 678* 593.3*    DDimer No results for input(s): "DDIMER" in the last 168 hours.   Radiology/Studies:  US Venous Img Lower Bilateral  Result Date: 06/11/2023 CLINICAL DATA:  Peripheral edema EXAM: BILATERAL LOWER EXTREMITY VENOUS DOPPLER ULTRASOUND TECHNIQUE: Gray-scale sonography with compression, as well as color and duplex ultrasound, were performed to evaluate the deep venous system(s) from the level of the common femoral vein through the popliteal and proximal calf veins. COMPARISON:  Lower extremity ultrasound on 07/22 FINDINGS: VENOUS Normal compressibility of the common femoral, superficial femoral, and popliteal veins, as well as the visualized calf veins. Visualized portions of profunda femoral vein and great saphenous vein unremarkable. No filling defects to suggest DVT on grayscale or color Doppler imaging. Doppler waveforms show normal direction of venous flow, normal respiratory plasticity and response to augmentation. Limited views of the contralateral common femoral vein are unremarkable. OTHER Subcutaneous edema in lower extremities. Limitations: none IMPRESSION: Negative for acute DVT in the bilateral lower extremities. Electronically Signed   By: Minerva Fester M.D.   On: 06/11/2023 19:58   DG Chest 2 View  Result Date: 06/11/2023 CLINICAL DATA:  Chest pain EXAM:  CHEST - 2 VIEW COMPARISON:  06/09/2023, CT 11/20/2021 FINDINGS: Mild cardiomegaly. No pleural effusion. Central airways thickening without focal airspace disease. Linear artifact over the right supraclavicular region. Aortic atherosclerosis IMPRESSION: Mild cardiomegaly. Central airways thickening without focal airspace disease. Electronically Signed   By: Jasmine Pang M.D.   On: 06/11/2023 15:38   DG Chest 2 View  Result Date: 06/09/2023 CLINICAL DATA:  dyspnea last night. pedal edema EXAM: CHEST - 2 VIEW COMPARISON:  December 09, 2022 FINDINGS: The cardiomediastinal silhouette is unchanged in contour.Tortuous thoracic aorta. Atherosclerotic calcifications. No pleural effusion. No pneumothorax. Mild diffuse interstitial prominence with peribronchial cuffing. Visualized abdomen is unremarkable. Mild degenerative changes of the thoracic spine. IMPRESSION: Constellation of findings are favored to reflect mild pulmonary edema. Differential considerations include atypical infection. Electronically Signed   By: Meda Klinefelter M.D.   On: 06/09/2023 16:52     Assessment and Plan:   Chest pain/nonobstructive coronary artery disease -Arrived with chest pain over the last several weeks but on exam is chest pain-free and further discussion with the patient it sounded like 1 episode -Last heart catheterization completed in 12/22 with nonobstructive disease with mid LAD lesion of 20% -High-sensitivity troponins trended flat 87, 94, 88, 82 -Noted to have elevated troponins over the last 4 years on chart review -Since patient has had chest pain free and high-sensitivity troponins trended flat no current need for heparin infusion -EKG as needed for pain or changes -Continue with telemetry monitoring -Pending echocardiogram results can consider stress testing  Shortness of breath -Complaints of shortness of breath -BNP 593.3 -Maintaining oxygen saturations on room air -Will evaluate the need for furosemide  daily, previous diuretic therapy had to be stopped due to increasing creatine  -Swelling to bilateral lower extremities with ultrasound negative for DVT -Echocardiogram ordered and pending with further recommendations to follow  Hypertension -Blood pressure 157/76 -Continue on losartan 50 mg daily, Toprol-XL 25 mg daily (has not received medications as of yet) -Vital signs per unit protocol  Hyperlipidemia -Continued on pravastatin  Type 2 diabetes -Continue on sliding scale insulin -Management per IM  Gastroesophageal reflux disease -Continue on PPI therapy  CKD -Serum creatinine 1.28,  appears to be at baseline -Monitor urine output -Daily BMP -Monitor/trend/replete electrolytes as needed -Avoid nephrotoxic agents were able   Risk Assessment/Risk Scores:          For questions or updates, please contact [facility] Please consult [email or web address] for contact info under    Signed, Cacey Willow, NP  06/12/2023 11:54 AM

## 2023-06-12 NOTE — Assessment & Plan Note (Signed)
Patient presenting with chest pain in setting of multiple comorbidities.  On my interview sounded like only 1 episode, though when discussing with ED provider sounded as if this has been progressive and recurrent over the past few weeks.  In addition she has some symptoms suggestive of CHF which are new since her last evaluation.  That being said cardiac evaluation 2 years prior was very reassuring with no evidence of obstructive CAD.  Plan to consult cardiology in a.m. to see if patient warrants stress testing. - Telemetry - Repeat echocardiogram - Trend troponin to peak - Consult cardiology in a.m.

## 2023-06-12 NOTE — Assessment & Plan Note (Signed)
Mildly increased on repeat, will trend to peak.  Appears to have somewhat of a chronic troponinemia which her CKD is also likely contributing to.

## 2023-06-12 NOTE — Progress Notes (Signed)
PROGRESS NOTE    Donna Fuller  WFU:[id number] DOB: March 04, 1941 DOA: 06/11/2023 PCP: Esperanza Richters, PA-C   Brief Narrative:  This 82 y.o. female with medical history significant for type 2 diabetes, CKD, CAD, LVH, hypertension, who presents with chest pain.  Patient was admitted with chest pain in December 2022,  at that time she underwent left heart catheterization which showed nonobstructive coronary artery disease.  Patient has developed left-sided chest pain at rest at home which she describes as central and nonradiating lasted for 5 minutes.  She also reported associated diaphoresis, stated chest pain feels different than her prior admissions.  She reports she cannot lie down flat at night because feels short of breath.  She also reports her legs are more swollen.  Chest x-ray shows mild cardiomegaly.  Bilateral lower extremity duplex negative for DVT.  Patient is admitted for further evaluation,  cardiology is consulted.  Assessment & Plan:   Principal Problem:   Chest pain Active Problems:   Hypertension   Diabetes mellitus type 2, controlled (HCC)   CKD (chronic kidney disease) stage 3, GFR 30-59 ml/min (HCC)   CAD (coronary artery disease)   LV hypertrophy, hypertensive   Elevated troponin   Known medical problems  Chest pain: Patient presented with chest pain in the setting of multiple comorbidities. Patient describes chest pain, intermittent and associated with diaphoresis recurrent over the past few weeks. She also reports symptoms consistent with CHF. She has left heart cath in 11/18/2021 which was nonobstructive coronary artery disease. Troponin 87> 94 >88>82> Cardiology is consulted.  Continue telemetry Obtain 2D echocardiogram.  Elevated troponin: Could be secondary to demand ischemia.   She was also noted to have a chronic troponinemia due to her CKD. Follow-up cardiology.    CVD-continue aspirin. Glaucoma-continue brimonidine, latanoprost,  timolol Neuropathy-continue gabapentin Hypertension-continue losartan, metoprolol GERD-continue PPI Hyperlipidemia-continue pravastatin DM2-hold Trulicity and glipizide.  Sliding scale insulin correction factor   DVT prophylaxis: heparin Code Status: DNR Family Communication: No family at bed side. Disposition Plan:    Status is: Observation The patient remains OBS appropriate and will d/c before 2 midnights.  Admitted for intermittent recurrent chest pain.  Cardiology is consulted.   Consultants:  Cardiology  Procedures: Echocardiogram  Antimicrobials:  Anti-infectives (From admission, onward)    None      Subjective: Patient was seen and examined at bedside.  Overnight events noted.   Patient reports chest pain has improved,  She still feels pressure, denies any shortness of breath.  Objective: Vitals:   06/12/23 0828 06/12/23 1013 06/12/23 1017 06/12/23 1412  BP: (!) 153/79 (!) 157/76 (!) 157/76 (!) 179/86  Pulse: 79 82 84 88  Resp: 20 18  20   Temp: 98.8 F (37.1 C)   98.8 F (37.1 C)  TempSrc: Oral   Oral  SpO2: 100% 98%  99%  Weight:      Height:       No intake or output data in the 24 hours ending 06/12/23 1439 Filed Weights   06/11/23 1452  Weight: 89.5 kg    Examination:  General exam: Appears calm and comfortable, not in any acute distress. Respiratory system: CTA bilaterally . Respiratory effort normal.  RR 14 Cardiovascular system: S1 & S2 heard, regular rate and rhythm, no murmur. Gastrointestinal system: Abdomen is soft, non tender, non distended, bowel sounds present Central nervous system: Alert and oriented x 3 . No focal neurological deficits. Extremities: Edema+, no cyanosis, no clubbing Skin: No rashes, lesions or ulcers Psychiatry:  Judgement and insight appear normal. Mood & affect appropriate.     Data Reviewed: I have personally reviewed following labs and imaging studies  CBC: Recent Labs  Lab 06/10/23 1544 06/11/23 1453  06/12/23 0620  WBC 6.4 5.6 6.3  NEUTROABS 3.9  --   --   HGB 11.6* 11.2* 11.0*  HCT 35.6* 35.0* 33.4*  MCV 93.5 94.6 92.3  PLT 139.0* 138* 130*   Basic Metabolic Panel: Recent Labs  Lab 06/10/23 1544 06/11/23 1453 06/12/23 0620  NA 137 140 141  K 5.0 4.7 4.2  CL 105 106 111  CO2 27 25 22   GLUCOSE 167* 144* 88  BUN 26* 27* 25*  CREATININE 1.74* 1.47* 1.28*  CALCIUM 9.8 9.3 9.0   GFR: Estimated Creatinine Clearance: 35.1 mL/min (A) (by C-G formula based on SCr of 1.28 mg/dL (H)). Liver Function Tests: Recent Labs  Lab 06/10/23 1544 06/12/23 0620  AST 19 19  ALT 17 18  ALKPHOS 113 97  BILITOT 0.7 0.7  PROT 7.2 6.6  ALBUMIN 4.1 3.7   No results for input(s): "LIPASE", "AMYLASE" in the last 168 hours. No results for input(s): "AMMONIA" in the last 168 hours. Coagulation Profile: No results for input(s): "INR", "PROTIME" in the last 168 hours. Cardiac Enzymes: No results for input(s): "CKTOTAL", "CKMB", "CKMBINDEX", "TROPONINI" in the last 168 hours. BNP (last 3 results) No results for input(s): "PROBNP" in the last 8760 hours. HbA1C: No results for input(s): "HGBA1C" in the last 72 hours. CBG: Recent Labs  Lab 06/12/23 0832 06/12/23 1153  GLUCAP 105* 135*   Lipid Profile: No results for input(s): "CHOL", "HDL", "LDLCALC", "TRIG", "CHOLHDL", "LDLDIRECT" in the last 72 hours. Thyroid Function Tests: No results for input(s): "TSH", "T4TOTAL", "FREET4", "T3FREE", "THYROIDAB" in the last 72 hours. Anemia Panel: No results for input(s): "VITAMINB12", "FOLATE", "FERRITIN", "TIBC", "IRON", "RETICCTPCT" in the last 72 hours. Sepsis Labs: No results for input(s): "PROCALCITON", "LATICACIDVEN" in the last 168 hours.  No results found for this or any previous visit (from the past 240 hour(s)).    Radiology Studies: US Venous Img Lower Bilateral  Result Date: 06/11/2023 CLINICAL DATA:  Peripheral edema EXAM: BILATERAL LOWER EXTREMITY VENOUS DOPPLER ULTRASOUND  TECHNIQUE: Gray-scale sonography with compression, as well as color and duplex ultrasound, were performed to evaluate the deep venous system(s) from the level of the common femoral vein through the popliteal and proximal calf veins. COMPARISON:  Lower extremity ultrasound on 07/22 FINDINGS: VENOUS Normal compressibility of the common femoral, superficial femoral, and popliteal veins, as well as the visualized calf veins. Visualized portions of profunda femoral vein and great saphenous vein unremarkable. No filling defects to suggest DVT on grayscale or color Doppler imaging. Doppler waveforms show normal direction of venous flow, normal respiratory plasticity and response to augmentation. Limited views of the contralateral common femoral vein are unremarkable. OTHER Subcutaneous edema in lower extremities. Limitations: none IMPRESSION: Negative for acute DVT in the bilateral lower extremities. Electronically Signed   By: Minerva Fester M.D.   On: 06/11/2023 19:58   DG Chest 2 View  Result Date: 06/11/2023 CLINICAL DATA:  Chest pain EXAM: CHEST - 2 VIEW COMPARISON:  06/09/2023, CT 11/20/2021 FINDINGS: Mild cardiomegaly. No pleural effusion. Central airways thickening without focal airspace disease. Linear artifact over the right supraclavicular region. Aortic atherosclerosis IMPRESSION: Mild cardiomegaly. Central airways thickening without focal airspace disease. Electronically Signed   By: Jasmine Pang M.D.   On: 06/11/2023 15:38    Scheduled Meds:  aspirin EC  81  mg Oral Daily   brimonidine  1 drop Both Eyes TID   gabapentin  300 mg Oral TID   insulin aspart  0-6 Units Subcutaneous TID WC   ketorolac  1 drop Both Eyes BID   latanoprost  1 drop Both Eyes QHS   loratadine  10 mg Oral QPM   losartan  50 mg Oral Daily   metoprolol succinate  25 mg Oral Daily   pantoprazole  80 mg Oral Daily   pravastatin  20 mg Oral q1800   sodium chloride flush  3 mL Intravenous Q12H   timolol  1 drop Both Eyes  BID   Continuous Infusions:   LOS: 0 days    Time spent: 50 mins    Willeen Niece, MD [facility]   If 7PM-7AM, please contact night-coverage

## 2023-06-12 NOTE — Assessment & Plan Note (Addendum)
CVD-continue aspirin Glaucoma-continue brimonidine, latanoprost, timolol Neuropathy-continue gabapentin Hypertension-continue losartan, metoprolol GERD-continue PPI Hyperlipidemia-continue pravastatin DM2-hold Trulicity and glipizide.  Sliding scale insulin correction factor

## 2023-06-12 NOTE — Plan of Care (Signed)
  Problem: Coping: Goal: Ability to adjust to condition or change in health will improve Outcome: Progressing   Problem: Fluid Volume: Goal: Ability to maintain a balanced intake and output will improve Outcome: Progressing   Problem: Health Behavior/Discharge Planning: Goal: Ability to identify and utilize available resources and services will improve Outcome: Progressing Goal: Ability to manage health-related needs will improve Outcome: Progressing   Problem: Metabolic: Goal: Ability to maintain appropriate glucose levels will improve Outcome: Progressing   Problem: Nutritional: Goal: Maintenance of adequate nutrition will improve Outcome: Progressing Goal: Progress toward achieving an optimal weight will improve Outcome: Progressing   Problem: Skin Integrity: Goal: Risk for impaired skin integrity will decrease Outcome: Progressing   Problem: Tissue Perfusion: Goal: Adequacy of tissue perfusion will improve Outcome: Progressing   Problem: Education: Goal: Knowledge of General Education information will improve Description: Including pain rating scale, medication(s)/side effects and non-pharmacologic comfort measures Outcome: Progressing   Problem: Health Behavior/Discharge Planning: Goal: Ability to manage health-related needs will improve Outcome: Progressing   

## 2023-06-12 NOTE — ED Notes (Signed)
Pt cbg 105, will tell Lauren, RN

## 2023-06-13 ENCOUNTER — Encounter: Payer: Self-pay | Admitting: Family Medicine

## 2023-06-13 ENCOUNTER — Observation Stay (HOSPITAL_BASED_OUTPATIENT_CLINIC_OR_DEPARTMENT_OTHER)
Admit: 2023-06-13 | Discharge: 2023-06-13 | Disposition: A | Discharge status: Home / Self Care | Payer: PPO | Attending: Family Medicine | Admitting: Family Medicine

## 2023-06-13 DIAGNOSIS — I5033 Acute on chronic diastolic (congestive) heart failure: Secondary | ICD-10-CM

## 2023-06-13 DIAGNOSIS — R079 Chest pain, unspecified: Secondary | ICD-10-CM

## 2023-06-13 DIAGNOSIS — N1832 Chronic kidney disease, stage 3b: Secondary | ICD-10-CM | POA: Diagnosis not present

## 2023-06-13 DIAGNOSIS — I10 Essential (primary) hypertension: Secondary | ICD-10-CM

## 2023-06-13 DIAGNOSIS — E119 Type 2 diabetes mellitus without complications: Secondary | ICD-10-CM | POA: Diagnosis not present

## 2023-06-13 DIAGNOSIS — R7989 Other specified abnormal findings of blood chemistry: Secondary | ICD-10-CM | POA: Diagnosis not present

## 2023-06-13 LAB — BASIC METABOLIC PANEL
Anion gap: 10 (ref 5–15)
BUN: 27 mg/dL — ABNORMAL HIGH (ref 8–23)
CO2: 25 mmol/L (ref 22–32)
Calcium: 9.4 mg/dL (ref 8.9–10.3)
Chloride: 102 mmol/L (ref 98–111)
Creatinine, Ser: 1.46 mg/dL — ABNORMAL HIGH (ref 0.44–1.00)
GFR, Estimated: 36 mL/min — ABNORMAL LOW (ref >60)
Glucose, Bld: 195 mg/dL — ABNORMAL HIGH (ref 70–99)
Potassium: 4 mmol/L (ref 3.5–5.1)
Sodium: 137 mmol/L (ref 135–145)

## 2023-06-13 LAB — CBC
HCT: 35 % — ABNORMAL LOW (ref 36.0–46.0)
Hemoglobin: 11.6 g/dL — ABNORMAL LOW (ref 12.0–15.0)
MCH: 30.3 pg (ref 26.0–34.0)
MCHC: 33.1 g/dL (ref 30.0–36.0)
MCV: 91.4 fL (ref 80.0–100.0)
Platelets: 132 10*3/uL — ABNORMAL LOW (ref 150–400)
RBC: 3.83 MIL/uL — ABNORMAL LOW (ref 3.87–5.11)
RDW: 15.1 % (ref 11.5–15.5)
WBC: 7.5 10*3/uL (ref 4.0–10.5)
nRBC: 0 % (ref 0.0–0.2)

## 2023-06-13 LAB — ECHOCARDIOGRAM COMPLETE
AR max vel: 1.94 cm2
AV Peak grad: 8 mmHg
Ao pk vel: 1.41 m/s
Area-P 1/2: 3.31 cm2
Height: 61 in
MV M vel: 4.81 m/s
MV Peak grad: 92.5 mmHg
S' Lateral: 2.3 cm
Weight: 3156.99 oz

## 2023-06-13 LAB — GLUCOSE, CAPILLARY
Glucose-Capillary: 103 mg/dL — ABNORMAL HIGH (ref 70–99)
Glucose-Capillary: 174 mg/dL — ABNORMAL HIGH (ref 70–99)

## 2023-06-13 MED ORDER — TORSEMIDE 10 MG PO TABS: 10.0000 mg | ORAL_TABLET | ORAL | 0 refills | Status: DC

## 2023-06-13 MED ORDER — CARVEDILOL 12.5 MG PO TABS: 12.5000 mg | ORAL_TABLET | Freq: Two times a day (BID) | ORAL | 1 refills | Status: DC

## 2023-06-13 MED ORDER — TORSEMIDE 20 MG PO TABS: 10.0000 mg | ORAL_TABLET | ORAL | Status: DC

## 2023-06-13 NOTE — Discharge Summary (Signed)
Physician Discharge Summary   Patient: Donna Fuller MRN: [id number] DOB: 1941-10-24  Admit date:     06/11/2023  Discharge date: 06/13/23  Discharge Physician: Arnetha Courser   PCP: Esperanza Richters, PA-C   Recommendations at discharge:  Please obtain CBC and BMP in 1 week Follow-up with primary care provider Follow-up with cardiology  Discharge Diagnoses: Principal Problem:   Chest pain with moderate risk for cardiac etiology Active Problems:   Hypertension   Diabetes mellitus type 2, controlled (HCC)   CKD (chronic kidney disease) stage 3, GFR 30-59 ml/min (HCC)   CAD (coronary artery disease)   LV hypertrophy, hypertensive   Elevated troponin   Type 2 diabetes mellitus without complication (HCC)   Known medical problems   Acute on chronic diastolic heart failure [facility])   Hospital Course: 82 y.o. female with medical history significant for type 2 diabetes, CKD, CAD, LVH, hypertension, who presents with chest pain.  Patient was admitted with chest pain in December 2022,  at that time she underwent left heart catheterization which showed nonobstructive coronary artery disease.  Patient has developed left-sided chest pain at rest at home which she describes as central and nonradiating lasted for 5 minutes.  She also reported associated diaphoresis, stated chest pain feels different than her prior admissions.  She reports she cannot lie down flat at night because feels short of breath.  She also reports her legs are more swollen.  Chest x-ray shows mild cardiomegaly.  Bilateral lower extremity duplex negative for DVT.  Patient is admitted for further evaluation,  cardiology is consulted.   Patient did had mildly elevated troponin with a flat curve.  She has an history of left heart catheterization in December 2022 which was nonobstructive coronary artery disease.  Elevated troponin were most likely secondary to demand ischemia.  Echocardiogram without any significant  abnormality.  Chest pain resolved and patient was able to ambulate without any difficulty.  Patient will continue on current medications and need to have a close follow-up with her providers including cardiology for further recommendations.  Consultants: Cardiology Procedures performed: Echocardiogram Disposition: Home Diet recommendation:  Discharge Diet Orders (From admission, onward)     Start     Ordered   06/13/23 0000  Diet - low sodium heart healthy        06/13/23 1205           Cardiac and Carb modified diet DISCHARGE MEDICATION: Allergies as of 06/13/2023       Reactions   Penicillins Swelling   Sulfa Antibiotics Swelling   Amlodipine Swelling   LE Swelling   Lisinopril Cough        Medication List     STOP taking these medications    budesonide-formoterol 160-4.5 MCG/ACT inhaler Commonly known as: Symbicort   dicyclomine 10 MG capsule Commonly known as: BENTYL   DULoxetine 30 MG capsule Commonly known as: CYMBALTA   famotidine 20 MG tablet Commonly known as: PEPCID   fluticasone 50 MCG/ACT nasal spray Commonly known as: FLONASE   metoprolol succinate 25 MG 24 hr tablet Commonly known as: TOPROL-XL   montelukast 10 MG tablet Commonly known as: SINGULAIR   sucralfate 1 g tablet Commonly known as: CARAFATE       TAKE these medications    acetaminophen 650 MG CR tablet Commonly known as: TYLENOL Take 650 mg by mouth 2 (two) times daily.   albuterol 108 (90 Base) MCG/ACT inhaler Commonly known as: VENTOLIN HFA INHALE 2 PUFFS BY MOUTH EVERY  6 HOURS AS NEEDED   ascorbic acid 500 MG tablet Commonly known as: VITAMIN C Take 500 mg by mouth daily.   aspirin 81 MG tablet Take 81 mg by mouth daily.   Biotin 1000 MCG Chew Chew by mouth.   blood glucose meter kit and supplies Kit Dispense based on patient and insurance preference. Use up to four times daily as directed. (FOR ICD-9 250.00, 250.01).   brimonidine 0.2 % ophthalmic  solution Commonly known as: ALPHAGAN Place 1 drop into both eyes 3 (three) times daily.   carvedilol 12.5 MG tablet Commonly known as: COREG Take 1 tablet (12.5 mg total) by mouth 2 (two) times daily with a meal.   gabapentin 300 MG capsule Commonly known as: NEURONTIN TAKE 1 CAPSULE BY MOUTH THREE TIMES A DAY   glipiZIDE 5 MG tablet Commonly known as: GLUCOTROL Take 1 tablet (5 mg total) by mouth 2 (two) times daily before a meal.   ketorolac 0.5 % ophthalmic solution Commonly known as: ACULAR INSTILL 1 DROP INTO RIGHT EYE 4 TIMES A DAY What changed:  how much to take how to take this when to take this   latanoprost 0.005 % ophthalmic solution Commonly known as: XALATAN Place 1 drop into both eyes daily.   levocetirizine 5 MG tablet Commonly known as: XYZAL Take 1 tablet (5 mg total) by mouth every evening.   losartan 50 MG tablet Commonly known as: COZAAR Take 1 tablet (50 mg total) by mouth daily.   omeprazole 40 MG capsule Commonly known as: PRILOSEC TAKE 1 CAPSULE BY MOUTH TWICE A DAY   OneTouch Delica Lancets 33G Misc Use as instructed to check blood sugar 2-3 times a day.  DX E11.9   OneTouch Ultra test strip Generic drug: glucose blood CHECK BLOOD SUGAR 2 TO 3 TIMES DAILY AS DIRECTED   pravastatin 20 MG tablet Commonly known as: PRAVACHOL TAKE 1 TABLET BY MOUTH ONCE DAILY   timolol 0.5 % ophthalmic solution Commonly known as: TIMOPTIC Place 1 drop into both eyes 2 (two) times a day.   torsemide 10 MG tablet Commonly known as: DEMADEX Take 1 tablet (10 mg total) by mouth 3 (three) times a week. Monday, Wednesday and Friday Start taking on: June 14, 2023 What changed:  when to take this additional instructions Another medication with the same name was removed. Continue taking this medication, and follow the directions you see here.   Trulicity 1.5 MG/0.5ML Sopn Generic drug: Dulaglutide Inject 1.5 mg into the skin once a week.   Vitamin D-3  125 MCG (5000 UT) Tabs Take 1 tablet by mouth daily.        Follow-up Information     Saguier, Kateri Mc. Schedule an appointment as soon as possible for a visit in 1 week(s).   Specialties: Internal Medicine, Family Medicine Contact information: [address] [place] [address] [phone]                Discharge Exam: Ceasar Mons Weights   06/11/23 1452  Weight: 89.5 kg   General.  Obese elderly lady, in no acute distress. Pulmonary.  Lungs clear bilaterally, normal respiratory effort. CV.  Regular rate and rhythm, no JVD, rub or murmur. Abdomen.  Soft, nontender, nondistended, BS positive. CNS.  Alert and oriented .  No focal neurologic deficit. Extremities.  Trace LE edema, no cyanosis, pulses intact and symmetrical. Psychiatry.  Judgment and insight appears normal.   Condition at discharge: stable  The results of  significant diagnostics from this hospitalization (including imaging, microbiology, ancillary and laboratory) are listed below for reference.   Imaging Studies: ECHOCARDIOGRAM COMPLETE  Result Date: 06/13/2023    ECHOCARDIOGRAM REPORT   Patient Name:   Donna Fuller Date of Exam: 06/13/2023 Medical Rec #:  [id number]        Height:       61.0 in Accession #:    [id number]       Weight:       197.3 lb Date of Birth:  1941-02-19        BSA:          1.878 m Patient Age:    82 years         BP:           140/64 mmHg Patient Gender: F                HR:           72 bpm. Exam Location:  [facility] Procedure: 2D Echo Indications:     Chest Pain R07.9  History:         Patient has prior history of Echocardiogram examinations, most                  recent 11/21/2021.  Sonographer:     Overton Mam RDCS, FASE Referring Phys:  [id number] Venora Maples Diagnosing Phys: Chilton Si MD IMPRESSIONS  1. Left ventricular ejection fraction, by estimation, is 65 to 70%. The left ventricle has normal function. The left ventricle has no regional wall  motion abnormalities. There is moderate concentric left ventricular hypertrophy. Left ventricular diastolic parameters are consistent with Grade II diastolic dysfunction (pseudonormalization). Elevated left ventricular end-diastolic pressure.  2. Right ventricular systolic function is normal. The right ventricular size is normal. There is moderately elevated pulmonary artery systolic pressure.  3. The mitral valve is normal in structure. Mild mitral valve regurgitation. No evidence of mitral stenosis.  4. The aortic valve is tricuspid. Aortic valve regurgitation is not visualized. No aortic stenosis is present.  5. The inferior vena cava is normal in size with greater than 50% respiratory variability, suggesting right atrial pressure of 3 mmHg. FINDINGS  Left Ventricle: Left ventricular ejection fraction, by estimation, is 65 to 70%. The left ventricle has normal function. The left ventricle has no regional wall motion abnormalities. The left ventricular internal cavity size was normal in size. There is  moderate concentric left ventricular hypertrophy. Left ventricular diastolic parameters are consistent with Grade II diastolic dysfunction (pseudonormalization). Elevated left ventricular end-diastolic pressure. Right Ventricle: The right ventricular size is normal. No increase in right ventricular wall thickness. Right ventricular systolic function is normal. There is moderately elevated pulmonary artery systolic pressure. The tricuspid regurgitant velocity is 3.48 m/s, and with an assumed right atrial pressure of 3 mmHg, the estimated right ventricular systolic pressure is 51.4 mmHg. Left Atrium: Left atrial size was normal in size. Right Atrium: Right atrial size was normal in size. Pericardium: There is no evidence of pericardial effusion. Mitral Valve: The mitral valve is normal in structure. Mild mitral valve regurgitation, with posteriorly-directed jet. No evidence of mitral valve stenosis. Tricuspid Valve:  The tricuspid valve is normal in structure. Tricuspid valve regurgitation is mild . No evidence of tricuspid stenosis. Aortic Valve: The aortic valve is tricuspid. Aortic valve regurgitation is not visualized. No aortic stenosis is present. Aortic valve peak gradient measures 8.0 mmHg. Pulmonic Valve: The pulmonic valve was normal in structure. Pulmonic  valve regurgitation is not visualized. No evidence of pulmonic stenosis. Aorta: The aortic root is normal in size and structure. Venous: The inferior vena cava is normal in size with greater than 50% respiratory variability, suggesting right atrial pressure of 3 mmHg. IAS/Shunts: No atrial level shunt detected by color flow Doppler.  LEFT VENTRICLE PLAX 2D LVIDd:         3.30 cm   Diastology LVIDs:         2.30 cm   LV e' medial:    5.33 cm/s LV PW:         1.50 cm   LV E/e' medial:  20.6 LV IVS:        1.40 cm   LV e' lateral:   8.16 cm/s LVOT diam:     1.90 cm   LV E/e' lateral: 13.5 LV SV:         54 LV SV Index:   29 LVOT Area:     2.84 cm  RIGHT VENTRICLE RV Basal diam:  2.70 cm RV S prime:     9.36 cm/s TAPSE (M-mode): 1.6 cm LEFT ATRIUM             Index        RIGHT ATRIUM           Index LA diam:        3.40 cm 1.81 cm/m   RA Area:     17.20 cm LA Vol (A2C):   49.7 ml 26.47 ml/m  RA Volume:   45.40 ml  24.18 ml/m LA Vol (A4C):   36.0 ml 19.17 ml/m LA Biplane Vol: 43.0 ml 22.90 ml/m  AORTIC VALVE                 PULMONIC VALVE AV Area (Vmax): 1.94 cm     PV Vmax:          0.69 m/s AV Vmax:        141.00 cm/s  PV Peak grad:     1.9 mmHg AV Peak Grad:   8.0 mmHg     PR End Diast Vel: 6.25 msec LVOT Vmax:      96.60 cm/s LVOT Vmean:     69.200 cm/s LVOT VTI:       0.190 m  AORTA Ao Root diam: 3.10 cm Ao Asc diam:  3.20 cm MITRAL VALVE                TRICUSPID VALVE MV Area (PHT): 3.31 cm     TR Peak grad:   48.4 mmHg MV Decel Time: 229 msec     TR Vmax:        348.00 cm/s MR Peak grad: 92.5 mmHg MR Mean grad: 57.0 mmHg     SHUNTS MR Vmax:      481.00  cm/s   Systemic VTI:  0.19 m MR Vmean:     358.0 cm/s    Systemic Diam: 1.90 cm MV E velocity: 110.00 cm/s MV A velocity: 60.80 cm/s MV E/A ratio:  1.81 Chilton Si MD Electronically signed by Chilton Si MD Signature Date/Time: 06/13/2023/11:23:15 AM    Final    US Venous Img Lower Bilateral  Result Date: 06/11/2023 CLINICAL DATA:  Peripheral edema EXAM: BILATERAL LOWER EXTREMITY VENOUS DOPPLER ULTRASOUND TECHNIQUE: Gray-scale sonography with compression, as well as color and duplex ultrasound, were performed to evaluate the deep venous system(s) from the level of the common femoral vein through the popliteal and proximal calf veins. COMPARISON:  Lower extremity ultrasound on 07/22 FINDINGS: VENOUS Normal compressibility of the common femoral, superficial femoral, and popliteal veins, as well as the visualized calf veins. Visualized portions of profunda femoral vein and great saphenous vein unremarkable. No filling defects to suggest DVT on grayscale or color Doppler imaging. Doppler waveforms show normal direction of venous flow, normal respiratory plasticity and response to augmentation. Limited views of the contralateral common femoral vein are unremarkable. OTHER Subcutaneous edema in lower extremities. Limitations: none IMPRESSION: Negative for acute DVT in the bilateral lower extremities. Electronically Signed   By: Minerva Fester M.D.   On: 06/11/2023 19:58   DG Chest 2 View  Result Date: 06/11/2023 CLINICAL DATA:  Chest pain EXAM: CHEST - 2 VIEW COMPARISON:  06/09/2023, CT 11/20/2021 FINDINGS: Mild cardiomegaly. No pleural effusion. Central airways thickening without focal airspace disease. Linear artifact over the right supraclavicular region. Aortic atherosclerosis IMPRESSION: Mild cardiomegaly. Central airways thickening without focal airspace disease. Electronically Signed   By: Jasmine Pang M.D.   On: 06/11/2023 15:38   DG Chest 2 View  Result Date: 06/09/2023 CLINICAL DATA:   dyspnea last night. pedal edema EXAM: CHEST - 2 VIEW COMPARISON:  December 09, 2022 FINDINGS: The cardiomediastinal silhouette is unchanged in contour.Tortuous thoracic aorta. Atherosclerotic calcifications. No pleural effusion. No pneumothorax. Mild diffuse interstitial prominence with peribronchial cuffing. Visualized abdomen is unremarkable. Mild degenerative changes of the thoracic spine. IMPRESSION: Constellation of findings are favored to reflect mild pulmonary edema. Differential considerations include atypical infection. Electronically Signed   By: Meda Klinefelter M.D.   On: 06/09/2023 16:52   CT HEAD WO CONTRAST  Result Date: 05/31/2023 CLINICAL DATA:  Head trauma headache EXAM: CT HEAD WITHOUT CONTRAST TECHNIQUE: Contiguous axial images were obtained from the base of the skull through the vertex without intravenous contrast. RADIATION DOSE REDUCTION: This exam was performed according to the departmental dose-optimization program which includes automated exposure control, adjustment of the mA and/or kV according to patient size and/or use of iterative reconstruction technique. COMPARISON:  None Available. FINDINGS: Brain: No acute territorial infarction, hemorrhage or intracranial mass. The ventricles are nonenlarged. Vascular: No hyperdense vessels.  Carotid vascular calcification Skull: Normal. Negative for fracture or focal lesion. Sinuses/Orbits: No acute finding. Other: None IMPRESSION: Negative non contrasted CT appearance of the brain. Electronically Signed   By: Jasmine Pang M.D.   On: 05/31/2023 19:49    Microbiology: Results for orders placed or performed in visit on 04/05/23  Urine Culture     Status: None   Collection Time: 04/06/23  8:17 AM   Specimen: Urine  Result Value Ref Range Status   MICRO NUMBER: [id number]  Final   SPECIMEN QUALITY: Adequate  Final   Sample Source NOT GIVEN  Final   STATUS: FINAL  Final   Result: No Growth  Final    Labs: CBC: Recent Labs  Lab  06/10/23 1544 06/11/23 1453 06/12/23 0620 06/13/23 1056  WBC 6.4 5.6 6.3 7.5  NEUTROABS 3.9  --   --   --   HGB 11.6* 11.2* 11.0* 11.6*  HCT 35.6* 35.0* 33.4* 35.0*  MCV 93.5 94.6 92.3 91.4  PLT 139.0* 138* 130* 132*   Basic Metabolic Panel: Recent Labs  Lab 06/10/23 1544 06/11/23 1453 06/12/23 0620 06/13/23 1056  NA 137 140 141 137  K 5.0 4.7 4.2 4.0  CL 105 106 111 102  CO2 27 25 22 25   GLUCOSE 167* 144* 88 195*  BUN 26* 27* 25* 27*  CREATININE 1.74* 1.47* 1.28*  1.46*  CALCIUM 9.8 9.3 9.0 9.4   Liver Function Tests: Recent Labs  Lab 06/10/23 1544 06/12/23 0620  AST 19 19  ALT 17 18  ALKPHOS 113 97  BILITOT 0.7 0.7  PROT 7.2 6.6  ALBUMIN 4.1 3.7   CBG: Recent Labs  Lab 06/12/23 1153 06/12/23 1654 06/12/23 2055 06/13/23 0730 06/13/23 1150  GLUCAP 135* 118* 201* 103* 174*    Discharge time spent: greater than 30 minutes.  This record has been created using Conservation officer, historic buildings. Errors have been sought and corrected,but may not always be located. Such creation errors do not reflect on the standard of care.   Signed: Arnetha Courser, MD [facility] 06/13/2023

## 2023-06-13 NOTE — Progress Notes (Signed)
AVS given and reviewed with pt. Medications discussed. All questions answered to satisfaction. Pt verbalized understanding of information given. Pt escorted off the unit with all belongings via wheelchair by staff member.  

## 2023-06-13 NOTE — Progress Notes (Signed)
Rounding Note    Patient Name: Donna Fuller Date of Encounter: 06/13/2023  [facility] Cardiologist: Julien Nordmann, MD   Subjective   Patient seen on a.m. rounds.  Stating that she is doing much better.  Sitting on the side of the bed eating her breakfast.  Respirations are unlabored on room air.  Inaccurate I's and O's without any outputs recorded.  Patient states she has been up several times to go to the restroom.  Oxygen saturations are maintained on room air.  Inpatient Medications    Scheduled Meds:  aspirin EC  81 mg Oral Daily   brimonidine  1 drop Both Eyes TID   carvedilol  12.5 mg Oral BID WC   gabapentin  300 mg Oral TID   insulin aspart  0-6 Units Subcutaneous TID WC   ketorolac  1 drop Both Eyes BID   latanoprost  1 drop Both Eyes QHS   loratadine  10 mg Oral QPM   losartan  50 mg Oral Daily   pantoprazole  80 mg Oral Daily   pravastatin  20 mg Oral q1800   sodium chloride flush  3 mL Intravenous Q12H   timolol  1 drop Both Eyes BID   torsemide  20 mg Oral Daily   Continuous Infusions:  PRN Meds: acetaminophen **OR** acetaminophen, ondansetron **OR** ondansetron (ZOFRAN) IV, oxyCODONE, polyethylene glycol, traZODone   Vital Signs    Vitals:   06/12/23 1921 06/13/23 0004 06/13/23 0403 06/13/23 0734  BP: 130/63 (!) 107/53 123/61 (!) 140/64  Pulse: 82 60 69 67  Resp: 19 16 19 20   Temp: 98 F (36.7 C) 98.4 F (36.9 C) 98.3 F (36.8 C) 98.2 F (36.8 C)  TempSrc:  Oral Oral   SpO2: 100% 99% 95% 100%  Weight:      Height:        Intake/Output Summary (Last 24 hours) at 06/13/2023 1029 Last data filed at 06/13/2023 7829 Gross per 24 hour  Intake 243 ml  Output 0 ml  Net 243 ml      06/11/2023    2:52 PM 06/09/2023    3:57 PM 05/31/2023    5:37 PM  Last 3 Weights  Weight (lbs) 197 lb 5 oz 197 lb 6.4 oz 193 lb 4.8 oz  Weight (kg) 89.5 kg 89.54 kg 87.68 kg      Telemetry    Sinus bradycardia to sinus rhythm with rates of 50-60  with PACs- Personally Reviewed  ECG    New tracings- Personally Reviewed  Physical Exam   GEN: No acute distress.   Neck: No JVD Cardiac: RRR, no murmurs, rubs, or gallops.  Respiratory: Diminished in the bases to auscultation bilaterally.  Respirations are unlabored on room air GI: Soft, nontender, non-distended  MS: No edema; No deformity. Neuro:  Nonfocal  Psych: Normal affect   Labs    High Sensitivity Troponin:   Recent Labs  Lab 06/11/23 1453 06/11/23 1757 06/12/23 0030 06/12/23 0620  TROPONINIHS 87* 94* 88* 82*     Chemistry Recent Labs  Lab 06/10/23 1544 06/11/23 1453 06/12/23 0620  NA 137 140 141  K 5.0 4.7 4.2  CL 105 106 111  CO2 27 25 22   GLUCOSE 167* 144* 88  BUN 26* 27* 25*  CREATININE 1.74* 1.47* 1.28*  CALCIUM 9.8 9.3 9.0  PROT 7.2  --  6.6  ALBUMIN 4.1  --  3.7  AST 19  --  19  ALT 17  --  18  ALKPHOS 113  --  97  BILITOT 0.7  --  0.7  GFRNONAA  --  36* 42*  ANIONGAP  --  9 8    Lipids No results for input(s): "CHOL", "TRIG", "HDL", "LABVLDL", "LDLCALC", "CHOLHDL" in the last 168 hours.  Hematology Recent Labs  Lab 06/10/23 1544 06/11/23 1453 06/12/23 0620  WBC 6.4 5.6 6.3  RBC 3.81* 3.70* 3.62*  HGB 11.6* 11.2* 11.0*  HCT 35.6* 35.0* 33.4*  MCV 93.5 94.6 92.3  MCH  --  30.3 30.4  MCHC 32.5 32.0 32.9  RDW 15.8* 15.4 15.3  PLT 139.0* 138* 130*   Thyroid No results for input(s): "TSH", "FREET4" in the last 168 hours.  BNP Recent Labs  Lab 06/10/23 1544 06/11/23 1453  BNP 678* 593.3*    DDimer No results for input(s): "DDIMER" in the last 168 hours.   Radiology    US Venous Img Lower Bilateral  Result Date: 06/11/2023 CLINICAL DATA:  Peripheral edema EXAM: BILATERAL LOWER EXTREMITY VENOUS DOPPLER ULTRASOUND TECHNIQUE: Gray-scale sonography with compression, as well as color and duplex ultrasound, were performed to evaluate the deep venous system(s) from the level of the common femoral vein through the popliteal and  proximal calf veins. COMPARISON:  Lower extremity ultrasound on 07/22 FINDINGS: VENOUS Normal compressibility of the common femoral, superficial femoral, and popliteal veins, as well as the visualized calf veins. Visualized portions of profunda femoral vein and great saphenous vein unremarkable. No filling defects to suggest DVT on grayscale or color Doppler imaging. Doppler waveforms show normal direction of venous flow, normal respiratory plasticity and response to augmentation. Limited views of the contralateral common femoral vein are unremarkable. OTHER Subcutaneous edema in lower extremities. Limitations: none IMPRESSION: Negative for acute DVT in the bilateral lower extremities. Electronically Signed   By: Minerva Fester M.D.   On: 06/11/2023 19:58   DG Chest 2 View  Result Date: 06/11/2023 CLINICAL DATA:  Chest pain EXAM: CHEST - 2 VIEW COMPARISON:  06/09/2023, CT 11/20/2021 FINDINGS: Mild cardiomegaly. No pleural effusion. Central airways thickening without focal airspace disease. Linear artifact over the right supraclavicular region. Aortic atherosclerosis IMPRESSION: Mild cardiomegaly. Central airways thickening without focal airspace disease. Electronically Signed   By: Jasmine Pang M.D.   On: 06/11/2023 15:38    Cardiac Studies   LHC 11/21/21   Mid LAD lesion is 20% stenosed.   LV end diastolic pressure is mildly elevated.   1.  Mild nonobstructive coronary artery disease. 2.  Left ventricular angiography was not performed.  EF was normal by echo. 3.  Mildly elevated left ventricular end-diastolic pressure at 18 mmHg.   Recommendations: No culprit is identified for patient's symptoms and mildly elevated troponin.     TTE 11/21/21 1. Left ventricular ejection fraction, by estimation, is 60 to 65%. The  left ventricle has normal function. The left ventricle has no regional  wall motion abnormalities. There is moderate left ventricular hypertrophy.  Left ventricular diastolic   parameters are consistent with Grade II diastolic dysfunction  (pseudonormalization). The average left ventricular global longitudinal  strain is -13.6 %. The global longitudinal strain is abnormal.   2. Right ventricular systolic function is normal. The right ventricular  size is normal. Tricuspid regurgitation signal is inadequate for assessing  PA pressure.   3. The mitral valve is normal in structure. Trivial mitral valve  regurgitation. No evidence of mitral stenosis.   4. The aortic valve is tricuspid. Aortic valve regurgitation is not  visualized. No aortic stenosis is  present.   5. The inferior vena cava is normal in size with greater than 50%  respiratory variability, suggesting right atrial pressure of 3 mmHg.   Patient Profile     82 y.o. female with a past medical history of nonobstructive coronary artery disease with left heart catheterization in 2022, type 2 diabetes, CKD, atypical lobular hyperplasia of the left breast with ductal carcinoma of the right breast status postmastectomy (2007) osteoarthritis, anemia, hypertension, who has been seen and evaluated for chest discomfort and shortness of breath.  Assessment & Plan    Acute on chronic HFpEF -Resenting with progressive shortness of breath -BNP 593.3 -Restarted on torsemide with no further complaints of shortness of breath -Echocardiogram ordered and pending with further recommendations to follow -LVEF of 60-65% on previous study completed in 2022 -Continued on carvedilol, losartan, and torsemide -If kidney function remains stable after labs are drawn after restarting diuretic therapy recommend starting an SGLT2 inhibitor -Daily weights, I's and O's, low-sodium diet  Hypertension -Blood pressure 140/64 -Improved after restarting PTA medications -Continue current medication regimen -Vital signs per unit protocol  CKD 3B -On arrival serum creatinine 1.28 -Labs ordered for this morning after restarting diuretic  therapy -With previous history of elevated serum creatinine on diuretics close monitoring of renal function -Monitor urine output -Daily BMP -Continue to avoid nephrotoxic agents were able  Nonobstructive coronary artery disease/hyperlipidemia/elevated high-sensitivity troponin/chest discomfort -Patient with left heart catheterization in 2022 with mild nonobstructive disease -Elevated high-sensitivity troponin likely related to demand ischemia, patient had elevated troponins over the last 4 years -No plans for further ischemic evaluation -Chest pain-free on exam -Continued on aspirin and statin and beta-blocker therapy     For questions or updates, please contact [facility] Please consult [email or web address] for contact info under        Signed, Mckenzee Beem, NP  06/13/2023, 10:29 AM

## 2023-06-13 NOTE — Progress Notes (Signed)
  Echocardiogram 2D Echocardiogram has been performed.  Lenor Coffin 06/13/2023, 10:54 AM

## 2023-06-13 NOTE — Plan of Care (Signed)
  Problem: Pain Managment: Goal: General experience of comfort will improve Outcome: Progressing   Problem: Safety: Goal: Ability to remain free from injury will improve Outcome: Progressing   Problem: Skin Integrity: Goal: Risk for impaired skin integrity will decrease Outcome: Progressing   

## 2023-06-14 ENCOUNTER — Ambulatory Visit: Payer: PPO | Admitting: Medical

## 2023-06-14 ENCOUNTER — Telehealth: Payer: Self-pay | Admitting: Cardiology

## 2023-06-14 ENCOUNTER — Other Ambulatory Visit: Payer: Self-pay | Admitting: Medical

## 2023-06-14 NOTE — Telephone Encounter (Signed)
   Transition of Care Follow-up Phone Call Request    Patient Name: OTIE HEADLEE Date of Birth: [birth year] Date of Encounter: 06/14/2023  Primary Care Provider:  Esperanza Richters, PA-C Primary Cardiologist:  Julien Nordmann, MD  Garth Schlatter needs to be scheduled for a transition of care follow up appointment with a [facility] provider:  Hospital follow-up, 1-2 weeks for HFpEF with a BMP in 1 week  Please reach out to Garth Schlatter within 48 hours to confirm appointment and review transition of care protocol questionnaire.  Raymond Azure, NP  06/14/2023, 2:46 PM

## 2023-06-14 NOTE — Telephone Encounter (Signed)
Pt did cancel and r/s appointment to 06/18/23

## 2023-06-14 NOTE — Telephone Encounter (Signed)
Patient contacted regarding discharge from [facility] on 06/14/23.  Patient understands to follow up with Dr. Bing Matter  on 7/26 at @:00 pm at [facility]. Patient understands discharge instructions? yes Patient understands medications and regiment? yes Patient understands to bring all medications to this visit? Yes  Active on mychart- yes

## 2023-06-18 ENCOUNTER — Ambulatory Visit (INDEPENDENT_AMBULATORY_CARE_PROVIDER_SITE_OTHER): Payer: PPO | Admitting: Medical

## 2023-06-18 VITALS — BP 122/71 | HR 61 | Temp 97.8°F | Resp 16 | Ht 61.0 in | Wt 186.0 lb

## 2023-06-18 DIAGNOSIS — R5383 Other fatigue: Secondary | ICD-10-CM | POA: Diagnosis not present

## 2023-06-18 DIAGNOSIS — E785 Hyperlipidemia, unspecified: Secondary | ICD-10-CM

## 2023-06-18 DIAGNOSIS — R944 Abnormal results of kidney function studies: Secondary | ICD-10-CM | POA: Diagnosis not present

## 2023-06-18 DIAGNOSIS — I517 Cardiomegaly: Secondary | ICD-10-CM

## 2023-06-18 LAB — CBC WITH DIFFERENTIAL/PLATELET
Absolute Monocytes: 534 cells/uL (ref 200–950)
Basophils Absolute: 41 cells/uL (ref 0–200)
Basophils Relative: 0.7 %
Lymphs Abs: 2100 cells/uL (ref 850–3900)
Neutro Abs: 3028 cells/uL (ref 1500–7800)
Platelets: 147 10*3/uL (ref 140–400)
RDW: 13.6 % (ref 11.0–15.0)
Total Lymphocyte: 36.2 %
WBC: 5.8 10*3/uL (ref 3.8–10.8)

## 2023-06-18 MED ORDER — PRAVASTATIN SODIUM 20 MG PO TABS: 20.0000 mg | ORAL_TABLET | Freq: Every day | ORAL | 3 refills | Status: DC

## 2023-06-18 NOTE — Addendum Note (Signed)
Addended by: Mervin Kung A on: 06/18/2023 02:49 PM   Modules accepted: Orders

## 2023-06-18 NOTE — Patient Instructions (Addendum)
Overall better compared to prior visit before hospitalization. Normal neuro exam.  1. Fatigue, unspecified type Will follow labs to see if find cause.  - CBC w/Diff - B12 - TSH  2. Decreased GFR Follow - CBC w/Diff - Comp Met (CMET)  3. Cardiomegaly with htn(but controlled) Continue torsemide 1 tab three time weekly., losartan 50 mg daily and coreg 12.5 mg daily and  - CBC w/Diff - CMP - keep checking weight daily. If gradually increasing let me know. If any greater than 2 lb weight gain in one day let me know as well.  4. Hyperlipidemia, unspecified hyperlipidemia type -refill pravasatin - Lipid panel   Mild intermittent dizziness. Normal neurologic exam. If dizziness worsening let me know.  Keep cardiologist appt next week  Follow up with date with me to be determined after lab review.

## 2023-06-18 NOTE — Progress Notes (Signed)
Subjective:    Patient ID: Donna Fuller, female    DOB: 24-Feb-1941, 82 y.o.   MRN: [id number]  HPI Pt in for follow up from hospitalization.  Admit date:     06/11/2023  Discharge date: 06/13/23    Recommendations at discharge:  Please obtain CBC and BMP in 1 week Follow-up with primary care provider Follow-up with cardiology   Discharge Diagnoses: Principal Problem:   Chest pain with moderate risk for cardiac etiology Active Problems:   Hypertension   Diabetes mellitus type 2, controlled (HCC)   CKD (chronic kidney disease) stage 3, GFR 30-59 ml/min (HCC)   CAD (coronary artery disease)   LV hypertrophy, hypertensive   Elevated troponin   Type 2 diabetes mellitus without complication (HCC)   Known medical problems   Acute on chronic diastolic heart failure [facility])    Hospital Course: 82 y.o. female with medical history significant for type 2 diabetes, CKD, CAD, LVH, hypertension, who presents with chest pain.  Patient was admitted with chest pain in December 2022,  at that time she underwent left heart catheterization which showed nonobstructive coronary artery disease.  Patient has developed left-sided chest pain at rest at home which she describes as central and nonradiating lasted for 5 minutes.  She also reported associated diaphoresis, stated chest pain feels different than her prior admissions.  She reports she cannot lie down flat at night because feels short of breath.  She also reports her legs are more swollen.  Chest x-ray shows mild cardiomegaly.  Bilateral lower extremity duplex negative for DVT.  Patient is admitted for further evaluation,  cardiology is consulted.    Patient did had mildly elevated troponin with a flat curve.  She has an history of left heart catheterization in December 2022 which was nonobstructive coronary artery disease.  Elevated troponin were most likely secondary to demand ischemia.  Echocardiogram without any significant abnormality.    Chest pain resolved and patient was able to ambulate without any difficulty.   Patient will continue on current medications and need to have a close follow-up with her providers including cardiology for further recommendations.  Below are meds on dc summary.  acetaminophen 650 MG CR tablet Commonly known as: TYLENOL Take 650 mg by mouth 2 (two) times daily.    albuterol 108 (90 Base) MCG/ACT inhaler Commonly known as: VENTOLIN HFA INHALE 2 PUFFS BY MOUTH EVERY 6 HOURS AS NEEDED    ascorbic acid 500 MG tablet Commonly known as: VITAMIN C Take 500 mg by mouth daily.    aspirin 81 MG tablet Take 81 mg by mouth daily.    Biotin 1000 MCG Chew Chew by mouth.    blood glucose meter kit and supplies Kit Dispense based on patient and insurance preference. Use up to four times daily as directed. (FOR ICD-9 250.00, 250.01).    brimonidine 0.2 % ophthalmic solution Commonly known as: ALPHAGAN Place 1 drop into both eyes 3 (three) times daily.    carvedilol 12.5 MG tablet Commonly known as: COREG Take 1 tablet (12.5 mg total) by mouth 2 (two) times daily with a meal.    gabapentin 300 MG capsule Commonly known as: NEURONTIN TAKE 1 CAPSULE BY MOUTH THREE TIMES A DAY    glipiZIDE 5 MG tablet Commonly known as: GLUCOTROL Take 1 tablet (5 mg total) by mouth 2 (two) times daily before a meal.    ketorolac 0.5 % ophthalmic solution Commonly known as: ACULAR INSTILL 1 DROP INTO RIGHT EYE 4  TIMES A DAY What changed:  how much to take how to take this when to take this    latanoprost 0.005 % ophthalmic solution Commonly known as: XALATAN Place 1 drop into both eyes daily.    levocetirizine 5 MG tablet Commonly known as: XYZAL Take 1 tablet (5 mg total) by mouth every evening.    losartan 50 MG tablet Commonly known as: COZAAR Take 1 tablet (50 mg total) by mouth daily.    omeprazole 40 MG capsule Commonly known as: PRILOSEC TAKE 1 CAPSULE BY MOUTH TWICE A DAY     OneTouch Delica Lancets 33G Misc Use as instructed to check blood sugar 2-3 times a day.  DX E11.9    OneTouch Ultra test strip Generic drug: glucose blood CHECK BLOOD SUGAR 2 TO 3 TIMES DAILY AS DIRECTED    pravastatin 20 MG tablet Commonly known as: PRAVACHOL TAKE 1 TABLET BY MOUTH ONCE DAILY    timolol 0.5 % ophthalmic solution Commonly known as: TIMOPTIC Place 1 drop into both eyes 2 (two) times a day.    torsemide 10 MG tablet Commonly known as: DEMADEX Take 1 tablet (10 mg total) by mouth 3 (three) times a week. Monday, Wednesday and Friday Start taking on: June 14, 2023 What changed:  when to take this additional instructions Another medication with the same name was removed. Continue taking this medication, and follow the directions you see here.    Trulicity 1.5 MG/0.5ML Sopn Generic drug: Dulaglutide Inject 1.5 mg into the skin once a week.    Vitamin D-3 125 MCG (5000 UT) Tabs Take 1 tablet by mouth daily.    Pt has been checking her weight daily and has been about 183. Little elevated blood pressure.    Review of Systems  Constitutional:  Negative for chills, fatigue and fever.  Respiratory:  Negative for cough, chest tightness, shortness of breath and wheezing.   Cardiovascular:  Negative for chest pain and palpitations.  Gastrointestinal:  Negative for abdominal pain.  Musculoskeletal:  Negative for back pain, joint swelling and neck pain.  Skin:  Negative for pallor and rash.  Neurological:  Negative for facial asymmetry, speech difficulty and light-headedness.  Hematological:  Negative for adenopathy. Does not bruise/bleed easily.  Psychiatric/Behavioral:  Negative for behavioral problems and dysphoric mood.     Past Medical History:  Diagnosis Date   Arthritis    Breast cancer (HCC) 2007   Right breast, s/p mastectomy   Diabetes mellitus without complication (HCC)    GERD (gastroesophageal reflux disease)    Gout    Hyperlipidemia     Hypertension    Spinal stenosis      Social History   Socioeconomic History   Marital status: Married    Spouse name: Jimmy   Number of children: 3   Years of education: 12   Highest education level: Not on file  Occupational History   Not on file  Tobacco Use   Smoking status: Former    Types: Cigarettes    Passive exposure: Never   Smokeless tobacco: Never  Vaping Use   Vaping status: Never Used  Substance and Sexual Activity   Alcohol use: No   Drug use: No   Sexual activity: Not Currently  Other Topics Concern   Not on file  Social History Narrative   Marital Status:  Married Hydrologist)    Children:  Daughter(1) Son (1)    Pets:  None    Living Situation: Lives with husband and daughter.  Occupation:  Retired Contractor)    Education: 12th Grade    Tobacco Use/Exposure:  She used to smoke socially during the weekends but quit 40 years ago.    Alcohol Use:  Occasional   Drug Use:  None   Diet:  Regular   Exercise:  None   Hobbies:  Reading and playing volleyball             Social Determinants of Health   Financial Resource Strain: Low Risk  (10/01/2021)   Overall Financial Resource Strain (CARDIA)    Difficulty of Paying Living Expenses: Not hard at all  Food Insecurity: No Food Insecurity (06/12/2023)   Hunger Vital Sign    Worried About Running Out of Food in the Last Year: Never true    Ran Out of Food in the Last Year: Never true  Transportation Needs: No Transportation Needs (06/12/2023)   PRAPARE - Administrator, Civil Service (Medical): No    Lack of Transportation (Non-Medical): No  Physical Activity: Inactive (10/01/2021)   Exercise Vital Sign    Days of Exercise per Week: 0 days    Minutes of Exercise per Session: 0 min  Stress: No Stress Concern Present (10/01/2021)   [employer] of Occupational Health - Occupational Stress Questionnaire    Feeling of Stress : Not at all  Social Connections: Moderately Integrated (10/01/2021)    Social Connection and Isolation Panel [NHANES]    Frequency of Communication with Friends and Family: More than three times a week    Frequency of Social Gatherings with Friends and Family: More than three times a week    Attends Religious Services: More than 4 times per year    Active Member of [employer] or Organizations: No    Attends Banker Meetings: Never    Marital Status: Married  Catering manager Violence: Not At Risk (06/12/2023)   Humiliation, Afraid, Rape, and Kick questionnaire    Fear of Current or Ex-Partner: No    Emotionally Abused: No    Physically Abused: No    Sexually Abused: No    Past Surgical History:  Procedure Laterality Date   ABDOMINAL HYSTERECTOMY     ABDOMINAL SURGERY     BREAST BIOPSY Left    2016 benign    BREAST BIOPSY Left 02/16/2023   MM LT BREAST BX W LOC DEV 1ST LESION IMAGE BX SPEC STEREO GUIDE 02/16/2023 [facility]   LEFT HEART CATH AND CORONARY ANGIOGRAPHY N/A 11/21/2021   Procedure: LEFT HEART CATH AND CORONARY ANGIOGRAPHY;  Surgeon: Iran Ouch, MD;  Location: [facility] INVASIVE CV LAB;  Service: Cardiovascular;  Laterality: N/A;   MASTECTOMY     right side   REPLACEMENT TOTAL KNEE Bilateral    TUBAL LIGATION      Family History  Problem Relation Age of Onset   Diabetes Mother    Alzheimer's disease Mother    Hypertension Mother    Hypertension Father    Heart disease Father    Hyperlipidemia Father    Kidney disease Father    Diabetes Sister    Cancer Brother        lung   Alzheimer's disease Brother    Heart disease Brother    Hypertension Brother    Diabetes Brother    Hypertension Brother    Diabetes Brother    Hyperlipidemia Son    Hypertension Son    Diabetes Son    Hypertension Daughter    Hyperlipidemia Daughter  Allergies  Allergen Reactions   Penicillins Swelling   Sulfa Antibiotics Swelling   Amlodipine Swelling    LE Swelling   Lisinopril Cough    Current Outpatient Medications on  File Prior to Visit  Medication Sig Dispense Refill   acetaminophen (TYLENOL) 650 MG CR tablet Take 650 mg by mouth 2 (two) times daily.     albuterol (VENTOLIN HFA) 108 (90 Base) MCG/ACT inhaler INHALE 2 PUFFS BY MOUTH EVERY 6 HOURS AS NEEDED 8.5 each 2   ascorbic acid (VITAMIN C) 500 MG tablet Take 500 mg by mouth daily.     aspirin 81 MG tablet Take 81 mg by mouth daily.     Biotin 1000 MCG CHEW Chew by mouth.     blood glucose meter kit and supplies KIT Dispense based on patient and insurance preference. Use up to four times daily as directed. (FOR ICD-9 250.00, 250.01). 1 each 11   brimonidine (ALPHAGAN) 0.2 % ophthalmic solution Place 1 drop into both eyes 3 (three) times daily.     carvedilol (COREG) 12.5 MG tablet Take 1 tablet (12.5 mg total) by mouth 2 (two) times daily with a meal. 60 tablet 1   Cholecalciferol (VITAMIN D-3) 5000 units TABS Take 1 tablet by mouth daily.     Dulaglutide (TRULICITY) 1.5 MG/0.5ML SOPN Inject 1.5 mg into the skin once a week. 2 mL 3   gabapentin (NEURONTIN) 300 MG capsule TAKE 1 CAPSULE BY MOUTH THREE TIMES A DAY 90 capsule 2   glipiZIDE (GLUCOTROL) 5 MG tablet Take 1 tablet (5 mg total) by mouth 2 (two) times daily before a meal. 180 tablet 3   ketorolac (ACULAR) 0.5 % ophthalmic solution INSTILL 1 DROP INTO RIGHT EYE 4 TIMES A DAY (Patient taking differently: Place 1 drop into both eyes 2 (two) times daily. INSTILL 1 DROP INTO RIGHT EYE 4 TIMES A DAY) 5 mL 6   latanoprost (XALATAN) 0.005 % ophthalmic solution Place 1 drop into both eyes daily.     levocetirizine (XYZAL) 5 MG tablet Take 1 tablet (5 mg total) by mouth every evening. 30 tablet 3   losartan (COZAAR) 50 MG tablet Take 1 tablet (50 mg total) by mouth daily. 90 tablet 3   omeprazole (PRILOSEC) 40 MG capsule TAKE 1 CAPSULE BY MOUTH TWICE A DAY 60 capsule 0   OneTouch Delica Lancets 33G MISC Use as instructed to check blood sugar 2-3 times a day.  DX E11.9 100 each 3   ONETOUCH ULTRA test strip  CHECK BLOOD SUGAR 2 TO 3 TIMES DAILY AS DIRECTED 100 strip 5   pravastatin (PRAVACHOL) 20 MG tablet TAKE 1 TABLET BY MOUTH ONCE DAILY (Patient taking differently: Take 20 mg by mouth daily.) 90 tablet 0   timolol (TIMOPTIC) 0.5 % ophthalmic solution Place 1 drop into both eyes 2 (two) times a day.     torsemide (DEMADEX) 10 MG tablet Take 1 tablet (10 mg total) by mouth 3 (three) times a week. Monday, Wednesday and Friday 7 tablet 0   No current facility-administered medications on file prior to visit.    BP 122/71 (BP Location: Right Arm, Patient Position: Sitting, Cuff Size: Normal)   Pulse 61   Temp 97.8 F (36.6 C) (Oral)   Resp 16   Ht 5\' 1"  (1.549 m)   Wt 186 lb (84.4 kg)   SpO2 97%   BMI 35.14 kg/m        Objective:   Physical Exam  General Mental Status- Alert.  General Appearance- Not in acute distress.   Skin General: Color- Normal Color. Moisture- Normal Moisture.  Neck Carotid Arteries- Normal color. Moisture- Normal Moisture. No carotid bruits. No JVD.  Chest and Lung Exam Auscultation: Breath Sounds:-Normal.  Cardiovascular Auscultation:Rythm- Regular. Murmurs & Other Heart Sounds:Auscultation of the heart reveals- No Murmurs.  Abdomen Inspection:-Inspeection Normal. Palpation/Percussion:Note:No mass. Palpation and Percussion of the abdomen reveal- Non Tender, Non Distended + BS, no rebound or guarding.  Neurologic Cranial Nerve exam:- CN III-XII intact(No nystagmus), symmetric smile. Strength:- 5/5 equal and symmetric strength both upper and lower extremities.   Lower ext- no pedal edema.       Assessment & Plan:   Patient Instructions  Overall better compared to prior visit before hospitalization. Normal neuro exam.  1. Fatigue, unspecified type Will follow labs to see if find cause.  - CBC w/Diff - B12 - TSH  2. Decreased GFR Follow - CBC w/Diff - Comp Met (CMET)  3. Cardiomegaly with htn(but controlled) Continue torsemide 1 tab  three time weekly., losartan 50 mg daily and coreg 12.5 mg daily and  - CBC w/Diff - CMP - keep checking weight daily. If gradually increasing let me know. If any greater than 2 lb weight gain in one day let me know as well.  4. Hyperlipidemia, unspecified hyperlipidemia type -refill pravasatin - Lipid panel   Mild intermittent dizziness. Normal neurologic exam. If dizziness worsening let me know.  Keep cardiologist appt next week  Follow up with date with me to be determined after lab review.     Esperanza Richters, PA-C   Time spent with patient today was 40  minutes which consisted of chart revdew, discussing diagnosis, work up treatment and documentation.

## 2023-06-19 LAB — CBC WITH DIFFERENTIAL/PLATELET
Eosinophils Absolute: 99 cells/uL (ref 15–500)
Eosinophils Relative: 1.7 %
HCT: 34.7 % — ABNORMAL LOW (ref 35.0–45.0)
Hemoglobin: 11.4 g/dL — ABNORMAL LOW (ref 11.7–15.5)
MCH: 30.4 pg (ref 27.0–33.0)
MCHC: 32.9 g/dL (ref 32.0–36.0)
MCV: 92.5 fL (ref 80.0–100.0)
MPV: 11 fL (ref 7.5–12.5)
Monocytes Relative: 9.2 %
Neutrophils Relative %: 52.2 %
RBC: 3.75 10*6/uL — ABNORMAL LOW (ref 3.80–5.10)

## 2023-06-19 LAB — COMPREHENSIVE METABOLIC PANEL
AG Ratio: 1.6 (calc) (ref 1.0–2.5)
ALT: 11 U/L (ref 6–29)
AST: 15 U/L (ref 10–35)
Albumin: 4.4 g/dL (ref 3.6–5.1)
Alkaline phosphatase (APISO): 121 U/L (ref 37–153)
BUN/Creatinine Ratio: 21 (calc) (ref 6–22)
BUN: 40 mg/dL — ABNORMAL HIGH (ref 7–25)
CO2: 26 mmol/L (ref 20–32)
Calcium: 9.3 mg/dL (ref 8.6–10.4)
Chloride: 99 mmol/L (ref 98–110)
Creat: 1.9 mg/dL — ABNORMAL HIGH (ref 0.60–0.95)
Globulin: 2.8 g/dL (calc) (ref 1.9–3.7)
Glucose, Bld: 112 mg/dL — ABNORMAL HIGH (ref 65–99)
Potassium: 4.3 mmol/L (ref 3.5–5.3)
Sodium: 137 mmol/L (ref 135–146)
Total Bilirubin: 0.5 mg/dL (ref 0.2–1.2)
Total Protein: 7.2 g/dL (ref 6.1–8.1)

## 2023-06-19 LAB — LIPID PANEL
Cholesterol: 125 mg/dL (ref <200)
HDL: 38 mg/dL — ABNORMAL LOW (ref >=50)
LDL Cholesterol (Calc): 68 mg/dL (calc)
Non-HDL Cholesterol (Calc): 87 mg/dL (calc) (ref <130)
Total CHOL/HDL Ratio: 3.3 (calc) (ref <5.0)
Triglycerides: 102 mg/dL (ref <150)

## 2023-06-19 LAB — VITAMIN B12: Vitamin B-12: >2000 pg/mL — ABNORMAL HIGH (ref 200–1100)

## 2023-06-19 LAB — TSH: TSH: 1.69 mIU/L (ref 0.40–4.50)

## 2023-06-19 LAB — BRAIN NATRIURETIC PEPTIDE: Brain Natriuretic Peptide: 364 pg/mL — ABNORMAL HIGH (ref <100)

## 2023-06-23 ENCOUNTER — Telehealth: Payer: Self-pay | Admitting: Medical

## 2023-06-23 NOTE — Telephone Encounter (Signed)
Pt called stating that she I concerned with the potential side effects of pravastatin and wanted to look at a different medication.

## 2023-06-23 NOTE — Telephone Encounter (Signed)
Pt called , someone in house answered and stated they would tell her to call back

## 2023-06-23 NOTE — Telephone Encounter (Signed)
Pt called back and was advised of PCP recommendations per CMA instruction

## 2023-06-25 ENCOUNTER — Encounter: Payer: Self-pay | Admitting: Cardiology

## 2023-06-25 ENCOUNTER — Ambulatory Visit: Payer: PPO | Admitting: Cardiology

## 2023-06-25 VITALS — BP 116/70 | HR 68 | Ht 61.0 in | Wt 190.0 lb

## 2023-06-25 DIAGNOSIS — I10 Essential (primary) hypertension: Secondary | ICD-10-CM

## 2023-06-25 DIAGNOSIS — Z7984 Long term (current) use of oral hypoglycemic drugs: Secondary | ICD-10-CM

## 2023-06-25 DIAGNOSIS — E118 Type 2 diabetes mellitus with unspecified complications: Secondary | ICD-10-CM | POA: Diagnosis not present

## 2023-06-25 DIAGNOSIS — N1832 Chronic kidney disease, stage 3b: Secondary | ICD-10-CM

## 2023-06-25 DIAGNOSIS — I5032 Chronic diastolic (congestive) heart failure: Secondary | ICD-10-CM

## 2023-06-25 DIAGNOSIS — I2510 Atherosclerotic heart disease of native coronary artery without angina pectoris: Secondary | ICD-10-CM

## 2023-06-25 DIAGNOSIS — I5030 Unspecified diastolic (congestive) heart failure: Secondary | ICD-10-CM | POA: Insufficient documentation

## 2023-06-25 DIAGNOSIS — E785 Hyperlipidemia, unspecified: Secondary | ICD-10-CM

## 2023-06-25 MED ORDER — EZETIMIBE 10 MG PO TABS: 10.0000 mg | ORAL_TABLET | Freq: Every day | ORAL | 3 refills | Status: DC

## 2023-06-25 NOTE — Progress Notes (Unsigned)
Cardiology Consultation:    Date:  06/25/2023   ID:  [name], DOB [birth year], MRN [id number]  PCP:  Donna Richters, PA-C  Cardiologist:  Gypsy Balsam, MD   Referring MD: Donna Fuller   Chief Complaint  Patient presents with   Coronary Artery Disease    History of Present Illness:    Donna Fuller is a 82 y.o. female who is being seen today for the evaluation of congestive heart failure/coronary artery disease at the request of Saguier, Donna Fuller, [place].  Sweet lady with past medical history significant for longstanding diabetes, essential hypertension, hyperlipidemia, in 2022 she had a cardiac catheterization performed which showed 20% stenosis of mid LAD.  Recently she ended up going to the hospital because of atypical chest pain, rule out for myocardial infarction she was identified however to have diastolic dysfunction.  She described the fact that sometimes she gets swollen legs.  She walks getting short of breath quite easily but after being discharged from the hospital improved.  She described the fact that she always have to sleep with her head elevated because otherwise she gets short of breath.  Denies having any recent chest pain tightness squeezing pressure burning chest after being discharged from hospital.  She is diabetic diabetes is finally controlled, she was taking pravastatin however does not want to take any statin because of potential side effect even though when she taking pravastatin she did not have any side effect.  I will try to convince her to take Zetia.  Past Medical History:  Diagnosis Date   Arthritis    Breast cancer (HCC) 2007   Right breast, s/p mastectomy   Diabetes mellitus without complication (HCC)    GERD (gastroesophageal reflux disease)    Gout    Hyperlipidemia    Hypertension    Spinal stenosis     Past Surgical History:  Procedure Laterality Date   ABDOMINAL HYSTERECTOMY     ABDOMINAL SURGERY     BREAST BIOPSY Left     2016 benign    BREAST BIOPSY Left 02/16/2023   MM LT BREAST BX W LOC DEV 1ST LESION IMAGE BX SPEC STEREO GUIDE 02/16/2023 [facility]   LEFT HEART CATH AND CORONARY ANGIOGRAPHY N/A 11/21/2021   Procedure: LEFT HEART CATH AND CORONARY ANGIOGRAPHY;  Surgeon: Iran Ouch, MD;  Location: [facility] INVASIVE CV LAB;  Service: Cardiovascular;  Laterality: N/A;   MASTECTOMY     right side   REPLACEMENT TOTAL KNEE Bilateral    TUBAL LIGATION      Current Medications: Current Meds  Medication Sig   acetaminophen (TYLENOL) 650 MG CR tablet Take 650 mg by mouth 2 (two) times daily.   albuterol (VENTOLIN HFA) 108 (90 Base) MCG/ACT inhaler INHALE 2 PUFFS BY MOUTH EVERY 6 HOURS AS NEEDED (Patient taking differently: Inhale 2 puffs into the lungs every 6 (six) hours as needed for wheezing or shortness of breath.)   ascorbic acid (VITAMIN C) 1000 MG tablet Take 1,000 mg by mouth daily.   aspirin 81 MG tablet Take 81 mg by mouth daily.   blood glucose meter kit and supplies KIT Dispense based on patient and insurance preference. Use up to four times daily as directed. (FOR ICD-9 250.00, 250.01). (Patient taking differently: Inject 1 each into the skin as directed. Dispense based on patient and insurance preference. Use up to four times daily as directed. (FOR ICD-9 250.00, 250.01).)   brimonidine (ALPHAGAN) 0.2 % ophthalmic solution Place 1 drop into  both eyes 3 (three) times daily.   carvedilol (COREG) 12.5 MG tablet Take 1 tablet (12.5 mg total) by mouth 2 (two) times daily with a meal.   Cholecalciferol (VITAMIN D-3 PO) Take 10,000 Units by mouth daily.   Cyanocobalamin (B-12 PO) Take 1 tablet by mouth daily.   Dulaglutide (TRULICITY) 1.5 MG/0.5ML SOPN Inject 1.5 mg into the skin once a week.   gabapentin (NEURONTIN) 300 MG capsule TAKE 1 CAPSULE BY MOUTH THREE TIMES A DAY (Patient taking differently: Take 300 mg by mouth 3 (three) times daily. TAKE 1 CAPSULE BY MOUTH THREE TIMES A DAY)    glipiZIDE (GLUCOTROL) 5 MG tablet Take 1 tablet (5 mg total) by mouth 2 (two) times daily before a meal. (Patient taking differently: Take 5 mg by mouth See admin instructions. 1 tab am and 1/2 tab pm)   ketorolac (ACULAR) 0.5 % ophthalmic solution INSTILL 1 DROP INTO RIGHT EYE 4 TIMES A DAY (Patient taking differently: Place 1 drop into both eyes 2 (two) times daily. INSTILL 1 DROP INTO RIGHT EYE 4 TIMES A DAY)   latanoprost (XALATAN) 0.005 % ophthalmic solution Place 1 drop into both eyes daily.   levocetirizine (XYZAL) 5 MG tablet Take 1 tablet (5 mg total) by mouth every evening.   losartan (COZAAR) 50 MG tablet Take 1 tablet (50 mg total) by mouth daily.   omeprazole (PRILOSEC) 40 MG capsule TAKE 1 CAPSULE BY MOUTH TWICE A DAY   OneTouch Delica Lancets 33G MISC Use as instructed to check blood sugar 2-3 times a day.  DX E11.9 (Patient taking differently: 1 each by Other route See admin instructions. Use as instructed to check blood sugar 2-3 times a day.  DX E11.9)   ONETOUCH ULTRA test strip CHECK BLOOD SUGAR 2 TO 3 TIMES DAILY AS DIRECTED (Patient taking differently: 1 each by Other route as needed for other (GLucose).)   timolol (TIMOPTIC) 0.5 % ophthalmic solution Place 1 drop into both eyes 2 (two) times a day.   torsemide (DEMADEX) 10 MG tablet Take 1 tablet (10 mg total) by mouth 3 (three) times a week. Monday, Wednesday and Friday (Patient taking differently: Take 5 mg by mouth 3 (three) times a week. Monday, Wednesday and Friday)   [DISCONTINUED] Biotin 1000 MCG CHEW Chew 1 tablet by mouth daily.   [DISCONTINUED] pravastatin (PRAVACHOL) 20 MG tablet TAKE 1 TABLET BY MOUTH ONCE DAILY (Patient taking differently: Take 20 mg by mouth daily.)   [DISCONTINUED] pravastatin (PRAVACHOL) 20 MG tablet Take 1 tablet (20 mg total) by mouth daily.     Allergies:   Penicillins, Sulfa antibiotics, Amlodipine, and Lisinopril   Social History   Socioeconomic History   Marital status: Married     Spouse name: Donna Fuller   Number of children: 3   Years of education: 12   Highest education level: Not on file  Occupational History   Not on file  Tobacco Use   Smoking status: Former    Types: Cigarettes    Passive exposure: Never   Smokeless tobacco: Never  Vaping Use   Vaping status: Never Used  Substance and Sexual Activity   Alcohol use: No   Drug use: No   Sexual activity: Not Currently  Other Topics Concern   Not on file  Social History Narrative   Marital Status:  Married Hydrologist)    Children:  Daughter(1) Son (1)    Pets:  None    Living Situation: Lives with husband and daughter.  Occupation:  Retired Contractor)    Education: 12th Grade    Tobacco Use/Exposure:  She used to smoke socially during the weekends but quit 40 years ago.    Alcohol Use:  Occasional   Drug Use:  None   Diet:  Regular   Exercise:  None   Hobbies:  Reading and playing volleyball             Social Determinants of Health   Financial Resource Strain: Low Risk  (10/01/2021)   Overall Financial Resource Strain (CARDIA)    Difficulty of Paying Living Expenses: Not hard at all  Food Insecurity: No Food Insecurity (06/12/2023)   Hunger Vital Sign    Worried About Running Out of Food in the Last Year: Never true    Ran Out of Food in the Last Year: Never true  Transportation Needs: No Transportation Needs (06/12/2023)   PRAPARE - Administrator, Civil Service (Medical): No    Lack of Transportation (Non-Medical): No  Physical Activity: Inactive (10/01/2021)   Exercise Vital Sign    Days of Exercise per Week: 0 days    Minutes of Exercise per Session: 0 min  Stress: No Stress Concern Present (10/01/2021)   [employer] of Occupational Health - Occupational Stress Questionnaire    Feeling of Stress : Not at all  Social Connections: Moderately Integrated (10/01/2021)   Social Connection and Isolation Panel [NHANES]    Frequency of Communication with Friends and Family: More than  three times a week    Frequency of Social Gatherings with Friends and Family: More than three times a week    Attends Religious Services: More than 4 times per year    Active Member of [employer] or Organizations: No    Attends Engineer, structural: Never    Marital Status: Married     Family History: The patient's family history includes Alzheimer's disease in her brother and mother; Cancer in her brother; Diabetes in her brother, brother, mother, sister, and son; Heart disease in her brother and father; Hyperlipidemia in her daughter, father, and son; Hypertension in her brother, brother, daughter, father, mother, and son; Kidney disease in her father. ROS:   Please see the history of present illness.    All 14 point review of systems negative except as described per history of present illness.  EKGs/Labs/Other Studies Reviewed:    The following studies were reviewed today:   EKG:  EKG Interpretation Date/Time:  Friday June 25 2023 15:06:48 EDT Ventricular Rate:  62 PR Interval:  250 QRS Duration:  70 QT Interval:  432 QTC Calculation: 438 R Axis:   73  Text Interpretation: Sinus rhythm with 1st degree A-V block Low voltage QRS When compared with ECG of 11-Jun-2023 14:53, QRS axis Shifted left Confirmed by Gypsy Balsam [phone]) on 06/25/2023 3:08:44 PM    Recent Labs: 06/18/2023: ALT 11; Brain Natriuretic Peptide 364; BUN 40; Creat 1.90; Hemoglobin 11.4; Platelets 147; Potassium 4.3; Sodium 137; TSH 1.69  Recent Lipid Panel    Component Value Date/Time   CHOL 125 06/18/2023 1448   TRIG 102 06/18/2023 1448   HDL 38 (L) 06/18/2023 1448   CHOLHDL 3.3 06/18/2023 1448   VLDL 21 05/31/2019 0039   LDLCALC 68 06/18/2023 1448   LDLDIRECT 76.0 12/30/2016 1037    Physical Exam:    VS:  BP 116/70 (BP Location: Left Arm, Patient Position: Sitting)   Pulse 68   Ht 5\' 1"  (1.549 m)   Wt 190  lb (86.2 kg)   SpO2 97%   BMI 35.90 kg/m     Wt Readings from Last 3 Encounters:   06/25/23 190 lb (86.2 kg)  06/18/23 186 lb (84.4 kg)  06/11/23 197 lb 5 oz (89.5 kg)     GEN:  Well nourished, well developed in no acute distress HEENT: Normal NECK: No JVD; No carotid bruits LYMPHATICS: No lymphadenopathy CARDIAC: RRR, no murmurs, no rubs, no gallops RESPIRATORY:  Clear to auscultation without rales, wheezing or rhonchi  ABDOMEN: Soft, non-tender, non-distended MUSCULOSKELETAL:  No edema; No deformity  SKIN: Warm and dry NEUROLOGIC:  Alert and oriented x 3 PSYCHIATRIC:  Normal affect   ASSESSMENT:    1. Primary hypertension   2. Coronary artery disease involving native coronary artery of native heart without angina pectoris   3. Diabetes mellitus type 2 with complications (HCC)   4. Stage 3b chronic kidney disease (HCC)   5. Chronic diastolic congestive heart failure (HCC)    PLAN:    In order of problems listed above:  Essential hypertension blood pressure well-controlled we will continue present management. Coronary artery disease but nonobstructive based on cardiac catheterization from 2022.  Will continue monitoring.  She is on antiplatelet therapy which I will continue. Diastolic congestive heart failure.  She looks nicely compensated and euvolemic today on physical exam minimal swelling of lower extremities of all but no JVD no crackles.  The problem is her kidney dysfunction would prevent [place] from giving her more diuretic.  She checked her weight every day since her dose of diuretic has been reduced likely she did notice some increased weight but now seems to be stable I asked her to let us know if she will increase her weight by 2 pounds in 2 consecutive days.  She is already on beta-blocker however I will not be able to increase the dose of it because of first-degree AV block, she is also on ARB which I will continue.  She is taking diuretic 3 times a week. Dyslipidemia: She accepted my offer to start taking Zetia 10 mg daily fasting lipid profile is TLT 6  weeks   Medication Adjustments/Labs and Tests Ordered: Current medicines are reviewed at length with the patient today.  Concerns regarding medicines are outlined above.  Orders Placed This Encounter  Procedures   EKG 12-Lead   No orders of the defined types were placed in this encounter.   Signed, Georgeanna Lea, MD, [facility]. 06/25/2023 3:26 PM    [facility]

## 2023-06-25 NOTE — Patient Instructions (Addendum)
Medication Instructions:   START: Zetia 10mg  1 tablet daily   Lab Work: 3rd Floor   [address]  Your physician recommends that you return for lab work in:  6 weeks  You need to have labs done when you are fasting.  You can come Monday through Friday 8:00 am to 11:30am and 1:00 to 4:00. You do not need to make an appointment as the order has already been placed. The labs you are going to have done are Lipid      Testing/Procedures: None Ordered   Follow-Up: At [facility], you and your health needs are our priority.  As part of our continuing mission to provide you with exceptional heart care, we have created designated Provider Care Teams.  These Care Teams include your primary Cardiologist (physician) and Advanced Practice Providers (APPs -  Physician Assistants and Nurse Practitioners) who all work together to provide you with the care you need, when you need it.  We recommend signing up for the patient portal called "MyChart".  Sign up information is provided on this After Visit Summary.  MyChart is used to connect with patients for Virtual Visits (Telemedicine).  Patients are able to view lab/test results, encounter notes, upcoming appointments, etc.  Non-urgent messages can be sent to your provider as well.   To learn more about what you can do with MyChart, go to [email or web address].    Your next appointment:   3 month(s)  The format for your next appointment:   In Person  Provider:   Gypsy Balsam, MD    Other Instructions NA

## 2023-07-05 ENCOUNTER — Telehealth: Payer: Self-pay | Admitting: Cardiology

## 2023-07-05 NOTE — Telephone Encounter (Signed)
  Pt c/o of Chest Pain: STAT if active CP, including tightness, pressure, jaw pain, radiating pain to shoulder/upper arm/back, CP unrelieved by Nitro. Symptoms reported of SOB, nausea, vomiting, sweating.  1. Are you having CP right now?  No    2. Are you experiencing any other symptoms (ex. SOB, nausea, vomiting, sweating)? Fatigue, chest tightness and feeling bloated    3. Is your CP continuous or coming and going?    4. Have you taken Nitroglycerin? No   5. How long have you been experiencing CP? Yesterday     6. If NO CP at time of call then end call with telling Pt to call back or call 911 if Chest pain returns prior to return call from triage team.  The patient reported that yesterday she experienced some chest tightness, fatigue, and a feeling of bloating. She also noted a weight gain, now weighing 197 lbs, which is a 7 lbs increase since her last visit on 06/25/23. Additionally, she has swelling in her legs and ankles and has been wearing her compression stockings all weekend

## 2023-07-05 NOTE — Telephone Encounter (Signed)
Called pt in regards to concerns. Reports symptoms are intermittent and occur with activity.  Started last night. Does not have a recent BP reading.  Advised pt to sit for 3-5 min and then check BP.  135/68-66. Pt has a hx of reflux but takes Omeprazole 40 mg PO BID as ordered without missing doses. Denies left arm/shoulder pain and nausea.  Does note increased sweating and right arm pain. Denies increased sodium in diet.  Reviewed how pt takes torsemide reports only takes 5 mg PO 3 times per week.  Advised pt per our records is to take 10 mg 3 times per week.  Pt reports will take medication as prescribed.  Will take an additional dose of 5 mg to equal 10 mg for today.  Reports the following wt: 7/27-187 lbs 7/29-7/30-- 191 lbs 7/31-8/2- - 193 lbs 8/3-8/4-- 197 lbs 8/5- 195.5 lbs Advised if symptoms persist or worsen to call 911 for evaluation.

## 2023-07-12 ENCOUNTER — Other Ambulatory Visit: Payer: Self-pay | Admitting: Medical

## 2023-07-12 ENCOUNTER — Other Ambulatory Visit: Payer: Self-pay | Admitting: Physician Assistant

## 2023-07-12 DIAGNOSIS — E1165 Type 2 diabetes mellitus with hyperglycemia: Secondary | ICD-10-CM

## 2023-07-14 ENCOUNTER — Telehealth: Payer: Self-pay | Admitting: Internal Medicine

## 2023-07-14 NOTE — Telephone Encounter (Signed)
[employer] application completed, patient also provided income information.  Placed all in provider folder at front desk

## 2023-07-15 ENCOUNTER — Other Ambulatory Visit: Payer: Self-pay | Admitting: Internal Medicine

## 2023-07-15 MED ORDER — TRULICITY 1.5 MG/0.5ML ~~LOC~~ SOAJ: 1.5000 mg | SUBCUTANEOUS | 3 refills | Status: DC

## 2023-07-18 ENCOUNTER — Other Ambulatory Visit: Payer: Self-pay | Admitting: Medical

## 2023-07-19 DIAGNOSIS — E113393 Type 2 diabetes mellitus with moderate nonproliferative diabetic retinopathy without macular edema, bilateral: Secondary | ICD-10-CM | POA: Diagnosis not present

## 2023-07-19 DIAGNOSIS — Z961 Presence of intraocular lens: Secondary | ICD-10-CM | POA: Diagnosis not present

## 2023-07-19 DIAGNOSIS — H401113 Primary open-angle glaucoma, right eye, severe stage: Secondary | ICD-10-CM | POA: Diagnosis not present

## 2023-07-19 DIAGNOSIS — H401122 Primary open-angle glaucoma, left eye, moderate stage: Secondary | ICD-10-CM | POA: Diagnosis not present

## 2023-07-20 ENCOUNTER — Telehealth: Payer: Self-pay

## 2023-07-20 NOTE — Telephone Encounter (Signed)
Left message for patient to callback. Patient will need to fill out a new form for the [facility] application as the form submitted is not the updated application and they will not accept. Application will be placed up front for patient.

## 2023-07-23 ENCOUNTER — Other Ambulatory Visit: Payer: Self-pay

## 2023-07-23 MED ORDER — TORSEMIDE 10 MG PO TABS: 10.0000 mg | ORAL_TABLET | ORAL | 2 refills | Status: DC

## 2023-07-27 NOTE — Progress Notes (Unsigned)
Office Visit Note  Patient: Donna Fuller             Date of Birth: 11-16-1941           MRN: [id number]             PCP: Marisue Brooklyn Referring: Marisue Brooklyn Visit Date: 08/10/2023 Occupation: @GUAROCC @  Subjective:  Pain in both knee replacements   History of Present Illness: Donna Fuller is a 82 y.o. female with history of seropositive rheumatoid arthritis and DDD. She is not currently taking any immunosuppressive agents.  She presents today with increased pain in both knee replacements.  She has been using a cane to assist with ambulation.  Patient reports that she last saw her orthopedist about 1 year ago.  At the time there is discussion of proceeding with a knee revision but she had declined.  She does not want to proceed with repeat surgery.  She has considered physical therapy but was recently diagnosed with congestive heart failure which has been overwhelming.  She has been started on new medications and is processing the chronic disease.  She is not yet ready to proceed with physical therapy.  She is been taking Tylenol as needed for pain relief.  She is not wearing compression stockings up to her knees which has been helpful with fluid retention but she continues to have excess fluid which she plans on reaching out to her cardiologist about.   She has intermittent pain and stiffness in both hands but denies any joint swelling.  Activities of Daily Living:  Patient reports morning stiffness for all day. Patient Reports nocturnal pain.  Difficulty dressing/grooming: Reports Difficulty climbing stairs: Reports Difficulty getting out of chair: Reports Difficulty using hands for taps, buttons, cutlery, and/or writing: Reports  Review of Systems  Constitutional:  Positive for fatigue.  HENT:  Positive for mouth dryness. Negative for mouth sores.   Eyes:  Positive for dryness.  Cardiovascular:  Positive for palpitations.       Followed by cardiology.    Gastrointestinal:  Positive for constipation. Negative for blood in stool and diarrhea.  Endocrine: Negative for increased urination.  Genitourinary:  Negative for involuntary urination.  Musculoskeletal:  Positive for joint pain, gait problem, joint pain, joint swelling, myalgias, muscle weakness, morning stiffness, muscle tenderness and myalgias.  Skin:  Positive for hair loss. Negative for color change, rash and sensitivity to sunlight.  Allergic/Immunologic: Negative for susceptible to infections.  Neurological:  Positive for dizziness and headaches.  Hematological:  Negative for swollen glands.  Psychiatric/Behavioral:  Positive for depressed mood and sleep disturbance. The patient is nervous/anxious.     PMFS History:  Patient Active Problem List   Diagnosis Date Noted   Diastolic congestive heart failure (HCC) 06/25/2023   Known medical problems 06/12/2023   Acute on chronic diastolic heart failure (HCC) 06/12/2023   Chest pain with moderate risk for cardiac etiology 06/11/2023   Type 2 diabetes mellitus without complication (HCC) 04/17/2022   Angina pectoris (HCC) 11/21/2021   Diabetes mellitus type 2 with complications (HCC) 11/21/2021   CAD (coronary artery disease) 20% narrowing of LAD based on cardiac catheterization from 2022 11/21/2021   LV hypertrophy, hypertensive 11/21/2021   Elevated troponin 11/21/2021   Atypical chest pain 11/20/2021   DDD (degenerative disc disease), lumbar 11/03/2021   Rheumatoid factor positive 07/29/2021   History of total bilateral knee replacement 07/29/2021   Other idiopathic scoliosis, thoracic region 07/08/2021   Estrogen deficiency 07/08/2021  Arthralgia of both hands 07/08/2021   Moderate nonproliferative diabetic retinopathy of left eye (HCC) 02/11/2021   Pseudophakia 02/11/2021   Primary open angle glaucoma of both eyes, moderate stage 02/11/2021   Cystoid macular edema, right eye 02/11/2021   Type 2 diabetes mellitus with  hyperglycemia, without long-term current use of insulin (HCC) 12/14/2019   Type 2 diabetes mellitus with diabetic polyneuropathy, without long-term current use of insulin (HCC) 12/14/2019   Pleuritic chest pain 05/30/2019   Fever 05/30/2019   Yeast vaginitis 07/19/2017   Urinary frequency 07/19/2017   CKD (chronic kidney disease) stage 3, GFR 30-59 ml/min (HCC) 05/20/2017   Gout 05/20/2017   Hyperlipidemia 09/26/2016   Spinal stenosis 09/26/2016   History of breast cancer 09/26/2016   Diabetes mellitus type 2, controlled (HCC) 09/26/2016   GERD (gastroesophageal reflux disease) 09/26/2016   Facet arthropathy, lumbosacral 03/13/2015   Neuropathy 03/13/2015   Hypertension    Lumbar radiculopathy 01/08/2014   Ductal carcinoma in situ (DCIS) of right breast 10/05/2011   Pain in joint, pelvic region and thigh 08/24/2011    Past Medical History:  Diagnosis Date   Arthritis    Breast cancer (HCC) 2007   Right breast, s/p mastectomy   Chronic diastolic congestive heart failure (HCC)    per patient, dx by cardiology   Diabetes mellitus without complication (HCC)    GERD (gastroesophageal reflux disease)    Gout    Hyperlipidemia    Hypertension    Spinal stenosis     Family History  Problem Relation Age of Onset   Diabetes Mother    Alzheimer's disease Mother    Hypertension Mother    Hypertension Father    Heart disease Father    Hyperlipidemia Father    Kidney disease Father    Diabetes Sister    Cancer Brother        lung   Alzheimer's disease Brother    Heart disease Brother    Hypertension Brother    Diabetes Brother    Hypertension Brother    Diabetes Brother    Hyperlipidemia Son    Hypertension Son    Diabetes Son    Hypertension Daughter    Hyperlipidemia Daughter    Past Surgical History:  Procedure Laterality Date   ABDOMINAL HYSTERECTOMY     ABDOMINAL SURGERY     BREAST BIOPSY Left    2016 benign    BREAST BIOPSY Left 02/16/2023   MM LT BREAST BX W  LOC DEV 1ST LESION IMAGE BX SPEC STEREO GUIDE 02/16/2023 [facility]   LEFT HEART CATH AND CORONARY ANGIOGRAPHY N/A 11/21/2021   Procedure: LEFT HEART CATH AND CORONARY ANGIOGRAPHY;  Surgeon: Iran Ouch, MD;  Location: [facility] INVASIVE [facility];  Service: Cardiovascular;  Laterality: N/A;   MASTECTOMY     right side   REPLACEMENT TOTAL KNEE Bilateral    TUBAL LIGATION     Social History   Social History Narrative   Marital Status:  Married Hydrologist)    Children:  Daughter(1) Son (1)    Pets:  None    Living Situation: Lives with husband and daughter.     Occupation:  Retired Contractor)    Education: 12th Grade    Tobacco Use/Exposure:  She used to smoke socially during the weekends but quit 40 years ago.    Alcohol Use:  Occasional   Drug Use:  None   Diet:  Regular   Exercise:  None   Hobbies:  Reading and playing volleyball  Immunization History  Administered Date(s) Administered   Fluad Quad(high Dose 65+) 08/11/2022   Influenza,inj,Quad PF,6+ Mos 08/30/2016   Influenza-Unspecified 08/18/2021, 08/15/2022   Moderna Covid-19 Vaccine Bivalent Booster 10yrs & up 08/29/2021   Moderna Sars-Covid-2 Vaccination 12/25/2019, 01/11/2020, 02/05/2020   Pneumococcal Conjugate-13 05/22/2015   Pneumococcal Polysaccharide-23 10/06/2021   Td 04/05/2023     Objective: Vital Signs: BP 117/77 (BP Location: Left Arm, Patient Position: Sitting, Cuff Size: Large)   Pulse 67   Resp 14   Ht 5\' 1"  (1.549 m)   Wt 204 lb 6.4 oz (92.7 kg)   BMI 38.62 kg/m    Physical Exam Vitals and nursing note reviewed.  Constitutional:      Appearance: She is well-developed.  HENT:     Head: Normocephalic and atraumatic.  Eyes:     Conjunctiva/sclera: Conjunctivae normal.  Cardiovascular:     Rate and Rhythm: Normal rate and regular rhythm.     Heart sounds: Normal heart sounds.  Pulmonary:     Effort: Pulmonary effort is normal.     Breath sounds: Normal breath sounds.  Abdominal:      General: Bowel sounds are normal.     Palpations: Abdomen is soft.  Musculoskeletal:     Cervical back: Normal range of motion.  Lymphadenopathy:     Cervical: No cervical adenopathy.  Skin:    General: Skin is warm and dry.     Capillary Refill: Capillary refill takes less than 2 seconds.  Neurological:     Mental Status: She is alert and oriented to person, place, and time.  Psychiatric:        Behavior: Behavior normal.      Musculoskeletal Exam: C-spine has good range of motion.  Limited mobility of the lumbar spine.  Shoulder joints have slightly limited abduction to about 120 degrees bilaterally.  Contractures of several PIP joints especially in the right hand noted.  Thickening of PIP and DIP joints.  No tenderness or synovitis over MCP joints.  Difficult to assess hip range of motion while in seated position.  Painful ROM of both knee replacements.  Tenderness over both ankles.  Patient is wearing compression stockings for edema.   CDAI Exam: CDAI Score: -- Patient Global: --; Provider Global: -- Swollen: --; Tender: -- Joint Exam 08/10/2023   No joint exam has been documented for this visit   There is currently no information documented on the homunculus. Go to the Rheumatology activity and complete the homunculus joint exam.  Investigation: No additional findings.  Imaging: No results found.  Recent Labs: Lab Results  Component Value Date   WBC 5.8 06/18/2023   HGB 11.4 (L) 06/18/2023   PLT 147 06/18/2023   NA 137 06/18/2023   K 4.3 06/18/2023   CL 99 06/18/2023   CO2 26 06/18/2023   GLUCOSE 112 (H) 06/18/2023   BUN 40 (H) 06/18/2023   CREATININE 1.90 (H) 06/18/2023   BILITOT 0.5 06/18/2023   ALKPHOS 97 06/12/2023   AST 15 06/18/2023   ALT 11 06/18/2023   PROT 7.2 06/18/2023   ALBUMIN 3.7 06/12/2023   CALCIUM 9.3 06/18/2023   GFRAA 51 (L) 06/03/2019   QFTBGOLDPLUS NEGATIVE 12/04/2021    Speciality Comments: No specialty comments  available.  Procedures:  No procedures performed Allergies: Penicillins, Sulfa antibiotics, Amlodipine, and Lisinopril   Assessment / Plan:     Visit Diagnoses: Seropositive rheumatoid arthritis (HCC) - RF+, Erosive arthritis, history of extensor tenosynovitis.  Currently not on any immunosuppressive agents.  Her rheumatoid arthritis remains in remission.  Patient has contractures of the left third PIP and all PIPs of the right hand.  No tenderness or inflammation noted over MCPs or wrist joints today.  No active synovitis was noted on examination today.  She is been experiencing intermittent pain and stiffness in both hands as well as in both knee replacements.  The discomfort in her hands is likely due to underlying osteoarthritis.  She is been taking Tylenol as needed for pain relief.  She does not currently require immunosuppressive therapy.  She was advised to notify us if she develops increased joint pain or joint swelling at which time we can schedule ultrasound to assess for synovitis.  She will follow up in 6 months or sooner if needed.   High risk medication use: Not currently taking any immunosuppressive agents.  Not a good candidate for TNF inhibitors in the future due to pain diagnosed with heart failure after recent hospitalization.  Trochanteric bursitis, left hip: Not currently symptomatic.  History of total bilateral knee replacement - 2013 by Dr. Christell Constant in [place].  Patient establish care with a new orthopedist locally about 1 year ago.  She has been having increased pain in both knee replacements.  She has been using a cane to assist with ambulation.  On examination today she has painful range of motion of both knees but no effusion noted.  Offered to place a referral to physical therapy but she would like to hold off at this time.  Other idiopathic scoliosis, thoracic region  DDD (degenerative disc disease), lumbar: Limited mobility.  Other medical conditions are listed as  follows:  Primary hypertension: BP was 117/77 today in the office.   Chronic diastolic congestive heart failure [facility]): Newly diagnosed after recent hospitalization.  Under care of cardiology.  Taking medications as prescribed.  Wearing compression stockings on bilateral lower extremities.  Stage 3a chronic kidney disease (HCC)  History of hyperlipidemia  Type 2 diabetes mellitus with diabetic polyneuropathy, without long-term current use of insulin (HCC)  Moderate nonproliferative diabetic retinopathy of left eye without macular edema associated with type 2 diabetes mellitus (HCC)  History of breast cancer  Gastroesophageal reflux disease without esophagitis  Primary open angle glaucoma of both eyes, moderate stage    Orders: No orders of the defined types were placed in this encounter.  No orders of the defined types were placed in this encounter.   Follow-Up Instructions: Return in about 6 months (around 02/07/2024) for Rheumatoid arthritis, DDD.   Gearldine Bienenstock, PA-C  Note - This record has been created using Dragon software.  Chart creation errors have been sought, but may not always  have been located. Such creation errors do not reflect on  the standard of medical care.

## 2023-07-30 ENCOUNTER — Telehealth: Payer: Self-pay

## 2023-07-30 NOTE — Telephone Encounter (Signed)
Patient states that she was notified that she has filled application out to soon and will download the newest application and bring back in a month.

## 2023-08-06 ENCOUNTER — Other Ambulatory Visit: Payer: Self-pay | Admitting: Physician Assistant

## 2023-08-06 DIAGNOSIS — E785 Hyperlipidemia, unspecified: Secondary | ICD-10-CM | POA: Diagnosis not present

## 2023-08-10 ENCOUNTER — Encounter: Payer: Self-pay | Admitting: Physician Assistant

## 2023-08-10 ENCOUNTER — Ambulatory Visit: Payer: PPO | Attending: Physician Assistant | Admitting: Physician Assistant

## 2023-08-10 ENCOUNTER — Telehealth: Payer: Self-pay | Admitting: Cardiology

## 2023-08-10 VITALS — BP 117/77 | HR 67 | Resp 14 | Ht 61.0 in | Wt 204.4 lb

## 2023-08-10 DIAGNOSIS — M7062 Trochanteric bursitis, left hip: Secondary | ICD-10-CM | POA: Diagnosis not present

## 2023-08-10 DIAGNOSIS — M5136 Other intervertebral disc degeneration, lumbar region: Secondary | ICD-10-CM

## 2023-08-10 DIAGNOSIS — E113392 Type 2 diabetes mellitus with moderate nonproliferative diabetic retinopathy without macular edema, left eye: Secondary | ICD-10-CM | POA: Diagnosis not present

## 2023-08-10 DIAGNOSIS — E1142 Type 2 diabetes mellitus with diabetic polyneuropathy: Secondary | ICD-10-CM | POA: Diagnosis not present

## 2023-08-10 DIAGNOSIS — Z79899 Other long term (current) drug therapy: Secondary | ICD-10-CM

## 2023-08-10 DIAGNOSIS — Z96653 Presence of artificial knee joint, bilateral: Secondary | ICD-10-CM | POA: Diagnosis not present

## 2023-08-10 DIAGNOSIS — I10 Essential (primary) hypertension: Secondary | ICD-10-CM

## 2023-08-10 DIAGNOSIS — Z853 Personal history of malignant neoplasm of breast: Secondary | ICD-10-CM

## 2023-08-10 DIAGNOSIS — Z8639 Personal history of other endocrine, nutritional and metabolic disease: Secondary | ICD-10-CM

## 2023-08-10 DIAGNOSIS — I5032 Chronic diastolic (congestive) heart failure: Secondary | ICD-10-CM

## 2023-08-10 DIAGNOSIS — M059 Rheumatoid arthritis with rheumatoid factor, unspecified: Secondary | ICD-10-CM

## 2023-08-10 DIAGNOSIS — N1831 Chronic kidney disease, stage 3a: Secondary | ICD-10-CM

## 2023-08-10 DIAGNOSIS — M4124 Other idiopathic scoliosis, thoracic region: Secondary | ICD-10-CM

## 2023-08-10 DIAGNOSIS — K219 Gastro-esophageal reflux disease without esophagitis: Secondary | ICD-10-CM

## 2023-08-10 DIAGNOSIS — M51369 Other intervertebral disc degeneration, lumbar region without mention of lumbar back pain or lower extremity pain: Secondary | ICD-10-CM

## 2023-08-10 DIAGNOSIS — H401132 Primary open-angle glaucoma, bilateral, moderate stage: Secondary | ICD-10-CM

## 2023-08-10 NOTE — Telephone Encounter (Signed)
Pt c/o swelling: STAT is pt has developed SOB within 24 hours  How much weight have you gained and in what time span?  About 7-9 lbs in 1 week - patient states last week her weight was at 197 lbs. This week the highest it has been is 206 lbs  If swelling, where is the swelling located?  Edema in ankles, legs, thighs + headaches  Are you currently taking a fluid pill?  Yes - patient takes Torsemide as prescribed   Are you currently SOB?  Not currently, but patient mentions becoming tired when moving around  Do you have a log of your daily weights (if so, list)?  9/10: 204 lbs  Have you gained 3 pounds in a day or 5 pounds in a week?   Have you traveled recently?  Patient travelled about 100 miles for a family reunion on 8/03. Patient states she has been elevating her legs and her husband helps her move her feet in and out of the car. Patient has also been wearing compression stockings.

## 2023-08-10 NOTE — Telephone Encounter (Signed)
Spoke with pt who states that she has been having increased leg swelling since 07/29/23.  07/29/23 204.8 08/01/23 201.3 08/02/23 201.7 08/03/23 203.5 08/04/23 206.7 08/05/23 201.7 08/06/23 202.6 08/07/23 204.1 08/08/23 200 08/09/23 201.1 08/10/23 204  Pt is short of breath with exertion. Pt takes Torsemide 10 mg on Monday, Wednesday and Friday. Advised to take a dose today. Pt is wearing compression hose, keeping her legs elevated and following a low Na diet. Please advise.

## 2023-08-11 DIAGNOSIS — G8929 Other chronic pain: Secondary | ICD-10-CM | POA: Diagnosis not present

## 2023-08-11 DIAGNOSIS — E785 Hyperlipidemia, unspecified: Secondary | ICD-10-CM | POA: Diagnosis not present

## 2023-08-11 DIAGNOSIS — N183 Chronic kidney disease, stage 3 unspecified: Secondary | ICD-10-CM | POA: Diagnosis not present

## 2023-08-11 DIAGNOSIS — E1142 Type 2 diabetes mellitus with diabetic polyneuropathy: Secondary | ICD-10-CM | POA: Diagnosis not present

## 2023-08-11 DIAGNOSIS — M059 Rheumatoid arthritis with rheumatoid factor, unspecified: Secondary | ICD-10-CM | POA: Diagnosis not present

## 2023-08-11 DIAGNOSIS — E261 Secondary hyperaldosteronism: Secondary | ICD-10-CM | POA: Diagnosis not present

## 2023-08-11 DIAGNOSIS — E1122 Type 2 diabetes mellitus with diabetic chronic kidney disease: Secondary | ICD-10-CM | POA: Diagnosis not present

## 2023-08-11 DIAGNOSIS — E1169 Type 2 diabetes mellitus with other specified complication: Secondary | ICD-10-CM | POA: Diagnosis not present

## 2023-08-11 DIAGNOSIS — F330 Major depressive disorder, recurrent, mild: Secondary | ICD-10-CM | POA: Diagnosis not present

## 2023-08-11 DIAGNOSIS — I509 Heart failure, unspecified: Secondary | ICD-10-CM | POA: Diagnosis not present

## 2023-08-11 DIAGNOSIS — I130 Hypertensive heart and chronic kidney disease with heart failure and stage 1 through stage 4 chronic kidney disease, or unspecified chronic kidney disease: Secondary | ICD-10-CM | POA: Diagnosis not present

## 2023-08-13 ENCOUNTER — Telehealth: Payer: Self-pay

## 2023-08-13 NOTE — Telephone Encounter (Signed)
Cholesterol Results reviewed with pt as per Dr. Vanetta Shawl note.  Pt verbalized understanding and had no additional questions. Routed to PCP

## 2023-08-16 ENCOUNTER — Telehealth: Payer: Self-pay | Admitting: Medical

## 2023-08-16 DIAGNOSIS — Z9181 History of falling: Secondary | ICD-10-CM

## 2023-08-16 DIAGNOSIS — I5032 Chronic diastolic (congestive) heart failure: Secondary | ICD-10-CM

## 2023-08-16 DIAGNOSIS — R0600 Dyspnea, unspecified: Secondary | ICD-10-CM

## 2023-08-16 NOTE — Telephone Encounter (Signed)
Pt called & wanted to know if her PT referral can be switched to home care service instead of her having to travel as she mentioned it is difficult commuting. Please call & advise pt.

## 2023-08-17 ENCOUNTER — Emergency Department (HOSPITAL_BASED_OUTPATIENT_CLINIC_OR_DEPARTMENT_OTHER)
Admission: EM | Admit: 2023-08-17 | Discharge: 2023-08-17 | Disposition: A | Discharge status: Home / Self Care | Payer: PPO | Attending: Emergency Medicine | Admitting: Emergency Medicine

## 2023-08-17 ENCOUNTER — Encounter (HOSPITAL_BASED_OUTPATIENT_CLINIC_OR_DEPARTMENT_OTHER): Payer: Self-pay | Admitting: Pediatrics

## 2023-08-17 ENCOUNTER — Emergency Department (HOSPITAL_BASED_OUTPATIENT_CLINIC_OR_DEPARTMENT_OTHER): Payer: PPO

## 2023-08-17 ENCOUNTER — Other Ambulatory Visit: Payer: Self-pay

## 2023-08-17 DIAGNOSIS — Z79899 Other long term (current) drug therapy: Secondary | ICD-10-CM | POA: Diagnosis not present

## 2023-08-17 DIAGNOSIS — Z853 Personal history of malignant neoplasm of breast: Secondary | ICD-10-CM | POA: Insufficient documentation

## 2023-08-17 DIAGNOSIS — Z7982 Long term (current) use of aspirin: Secondary | ICD-10-CM | POA: Diagnosis not present

## 2023-08-17 DIAGNOSIS — I110 Hypertensive heart disease with heart failure: Secondary | ICD-10-CM | POA: Diagnosis not present

## 2023-08-17 DIAGNOSIS — Z7984 Long term (current) use of oral hypoglycemic drugs: Secondary | ICD-10-CM | POA: Diagnosis not present

## 2023-08-17 DIAGNOSIS — I509 Heart failure, unspecified: Secondary | ICD-10-CM | POA: Diagnosis not present

## 2023-08-17 DIAGNOSIS — R079 Chest pain, unspecified: Secondary | ICD-10-CM | POA: Diagnosis not present

## 2023-08-17 DIAGNOSIS — E119 Type 2 diabetes mellitus without complications: Secondary | ICD-10-CM | POA: Insufficient documentation

## 2023-08-17 DIAGNOSIS — J9811 Atelectasis: Secondary | ICD-10-CM | POA: Diagnosis not present

## 2023-08-17 DIAGNOSIS — R0602 Shortness of breath: Secondary | ICD-10-CM | POA: Diagnosis present

## 2023-08-17 LAB — BASIC METABOLIC PANEL
Anion gap: 11 (ref 5–15)
BUN: 30 mg/dL — ABNORMAL HIGH (ref 8–23)
CO2: 26 mmol/L (ref 22–32)
Calcium: 9.3 mg/dL (ref 8.9–10.3)
Chloride: 105 mmol/L (ref 98–111)
Creatinine, Ser: 1.59 mg/dL — ABNORMAL HIGH (ref 0.44–1.00)
GFR, Estimated: 32 mL/min — ABNORMAL LOW (ref >60)
Glucose, Bld: 95 mg/dL (ref 70–99)
Potassium: 4.1 mmol/L (ref 3.5–5.1)
Sodium: 142 mmol/L (ref 135–145)

## 2023-08-17 LAB — CBC
HCT: 37.3 % (ref 36.0–46.0)
Hemoglobin: 12.1 g/dL (ref 12.0–15.0)
MCH: 29.7 pg (ref 26.0–34.0)
MCHC: 32.4 g/dL (ref 30.0–36.0)
MCV: 91.4 fL (ref 80.0–100.0)
Platelets: 132 10*3/uL — ABNORMAL LOW (ref 150–400)
RBC: 4.08 MIL/uL (ref 3.87–5.11)
RDW: 16 % — ABNORMAL HIGH (ref 11.5–15.5)
WBC: 5.5 10*3/uL (ref 4.0–10.5)
nRBC: 0 % (ref 0.0–0.2)

## 2023-08-17 LAB — BRAIN NATRIURETIC PEPTIDE: B Natriuretic Peptide: 814.6 pg/mL — ABNORMAL HIGH (ref 0.0–100.0)

## 2023-08-17 LAB — TROPONIN I (HIGH SENSITIVITY)
Troponin I (High Sensitivity): 81 ng/L — ABNORMAL HIGH (ref <18)
Troponin I (High Sensitivity): 78 ng/L — ABNORMAL HIGH (ref <18)

## 2023-08-17 MED ORDER — FUROSEMIDE 10 MG/ML IJ SOLN
80.0000 mg | Freq: Once | INTRAMUSCULAR | Status: AC
  Administered 2023-08-17: 80 mg via INTRAVENOUS
  Filled 2023-08-17: qty 8

## 2023-08-17 MED ORDER — TORSEMIDE 10 MG PO TABS: 10.0000 mg | ORAL_TABLET | Freq: Every day | ORAL | 0 refills | Status: DC

## 2023-08-17 NOTE — ED Provider Notes (Signed)
Care of patient received from prior provider at 5:59 PM, please see their note for complete H/P and care plan.  Received handoff per ED course.     Reassessment: Evaluated at bedside.  This is a 82 year old female presenting with a chief complaint of volume overload and shortness of breath in the setting of history of heart failure.  Cardiology provider came down and saw her at bedside and made recommendations for diuresis with IV Lasix and outpatient care and management if responsive. She has only been taking her torsemide once every other day.  Per chart she is was taking 3 times a day. She has had a robust response to the IV diuresis in the emergency room. Given this response.  Will start her on torsemide 10 mg daily for ongoing diuresis with plan for repeat blood work and follow-up with PCP or cardiology within 1 week.  Disposition:  I have considered need for hospitalization, however, considering all of the above, I believe this patient is stable for discharge at this time.  Patient/family educated about specific return precautions for given chief complaint and symptoms.  Patient/family educated about follow-up with PCP and caridiology.     Patient/family expressed understanding of return precautions and need for follow-up. Patient spoken to regarding all imaging and laboratory results and appropriate follow up for these results. All education provided in verbal form with additional information in written form. Time was allowed for answering of patient questions. Patient discharged.    Emergency Department Medication Summary:   Medications  furosemide (LASIX) injection 80 mg (80 mg Intravenous Given 08/17/23 1535)          Glyn Ade, MD 08/17/23 1810

## 2023-08-17 NOTE — Addendum Note (Signed)
Addended by: Maximino Sarin on: 08/17/2023 08:19 AM   Modules accepted: Orders

## 2023-08-17 NOTE — ED Triage Notes (Signed)
C/O shortness of breathe, worst with activity. Reports recently diagnosed with HF and is on torsemide. States associated chest pain as well.

## 2023-08-17 NOTE — Telephone Encounter (Signed)
Spoke with pt who now reports that she is having shortness of breath and not doing well. Advised to go to the ED for evaluation. Pt verbalized understanding and had no additional questions.

## 2023-08-17 NOTE — Telephone Encounter (Signed)
Referral placed.

## 2023-08-17 NOTE — ED Provider Notes (Addendum)
[facility] Provider Note   CSN: [id number] Arrival date & time: 08/17/23  1310     History  Chief Complaint  Patient presents with   Shortness of Breath    Donna Fuller is a 82 y.o. female history of breast cancer status postmastectomy, diabetes, CHF on torsemide, GERD, hypertension, hyperlipidemia presented for shortness of breath.  Patient states that she was recently hospitalized middle of August and diagnosed with CHF and is on furosemide however notes that over the past few days she is had progressive dyspnea on exertion and orthopnea.  Patient states that her legs do appear more swollen as the day goes on.  Patient has mild chest discomfort with this as well.  Patient states she is taking her torsemide.  Patient denied hemoptysis, recent travel/surgery, previous blood clots, unilateral leg swelling, fevers, green sputum  Last echo: 06/13/2023 LVEF 65 to 70%, grade 2 diastolic dysfunction, moderately elevated pulmonary artery pressure, mild mitral valve regurg  Home Medications Prior to Admission medications   Medication Sig Start Date End Date Taking? Authorizing Provider  acetaminophen (TYLENOL) 650 MG CR tablet Take 650 mg by mouth 2 (two) times daily.    [provider]  albuterol (VENTOLIN HFA) 108 (90 Base) MCG/ACT inhaler INHALE 2 PUFFS BY MOUTH EVERY 6 HOURS AS NEEDED Patient taking differently: Inhale 2 puffs into the lungs every 6 (six) hours as needed for wheezing or shortness of breath. 11/09/22   Saguier, Ramon Dredge, PA-C  ascorbic acid (VITAMIN C) 1000 MG tablet Take 1,000 mg by mouth daily.    [provider]  aspirin 81 MG tablet Take 81 mg by mouth daily.    [provider]  blood glucose meter kit and supplies KIT Dispense based on patient and insurance preference. Use up to four times daily as directed. (FOR ICD-9 250.00, 250.01). Patient taking differently: Inject 1 each into the skin as  directed. Dispense based on patient and insurance preference. Use up to four times daily as directed. (FOR ICD-9 250.00, 250.01). 07/09/17   Everlene Other G, DO  brimonidine (ALPHAGAN) 0.2 % ophthalmic solution Place 1 drop into both eyes 3 (three) times daily. 05/08/19   [provider]  carvedilol (COREG) 12.5 MG tablet Take 1 tablet (12.5 mg total) by mouth 2 (two) times daily with a meal. 06/13/23   Arnetha Courser, MD  Cholecalciferol (VITAMIN D-3 PO) Take 10,000 Units by mouth daily.    [provider]  Cyanocobalamin (B-12 PO) Take 1 tablet by mouth daily.    [provider]  Dulaglutide (TRULICITY) 1.5 MG/0.5ML SOPN Inject 1.5 mg into the skin once a week. 07/15/23   Shamleffer, Konrad Dolores, MD  ezetimibe (ZETIA) 10 MG tablet Take 1 tablet (10 mg total) by mouth daily. 06/25/23 09/23/23  Georgeanna Lea, MD  gabapentin (NEURONTIN) 300 MG capsule TAKE 1 CAPSULE BY MOUTH THREE TIMES A DAY Patient taking differently: Take 300 mg by mouth 3 (three) times daily. TAKE 1 CAPSULE BY MOUTH THREE TIMES A DAY 06/14/23   Saguier, Ramon Dredge, PA-C  glipiZIDE (GLUCOTROL) 5 MG tablet Take 1 tablet (5 mg total) by mouth 2 (two) times daily before a meal. Patient taking differently: Take 5 mg by mouth 2 (two) times daily before a meal. 1 tab am and 1/2 tab pm 04/21/23   Shamleffer, Konrad Dolores, MD  ketorolac (ACULAR) 0.5 % ophthalmic solution INSTILL 1 DROP INTO RIGHT EYE 4 TIMES A DAY Patient taking differently: INSTILL  1 DROP INTO RIGHT EYE 3 TIMES A DAY 04/07/22   Rankin, Alford Highland, MD  latanoprost (XALATAN) 0.005 % ophthalmic solution Place 1 drop into both eyes daily. 05/05/19   [provider]  levocetirizine (XYZAL) 5 MG tablet TAKE 1 TABLET BY MOUTH EVERY DAY IN THE EVENING 07/19/23   Saguier, Ramon Dredge, PA-C  losartan (COZAAR) 50 MG tablet Take 1 tablet (50 mg total) by mouth daily. 12/09/22   Saguier, Ramon Dredge, PA-C  omeprazole (PRILOSEC) 40 MG capsule Take 1 capsule (40 mg total)  by mouth 2 (two) times daily. Please call [phone] to schedule an office visit for more refills. 08/06/23   Unk Lightning, PA  OneTouch Delica Lancets 33G MISC Use as instructed to check blood sugar 2-3 times a day.  DX E11.9 Patient taking differently: 1 each by Other route See admin instructions. Use as instructed to check blood sugar 2-3 times a day.  DX E11.9 10/21/21   Shamleffer, Konrad Dolores, MD  ONETOUCH ULTRA TEST test strip CHECK BLOOD SUGAR 2 TO 3 TIMES DAILY AS DIRECTED 07/12/23   Saguier, Ramon Dredge, PA-C  timolol (TIMOPTIC) 0.5 % ophthalmic solution Place 1 drop into both eyes 2 (two) times a day. 03/15/19   [provider]  torsemide (DEMADEX) 10 MG tablet Take 1 tablet (10 mg total) by mouth 3 (three) times a week. Monday, Wednesday and Friday 07/23/23   Georgeanna Lea, MD      Allergies    Penicillins, Sulfa antibiotics, Amlodipine, and Lisinopril    Review of Systems   Review of Systems  Respiratory:  Positive for shortness of breath.     Physical Exam Updated Vital Signs BP (!) 158/81 (BP Location: Right Arm)   Pulse 77   Temp (!) 97.3 F (36.3 C)   Resp 18   Ht 5\' 1"  (1.549 m)   Wt 90.7 kg   SpO2 98%   BMI 37.79 kg/m  Physical Exam Vitals reviewed.  Constitutional:      General: She is not in acute distress. HENT:     Head: Normocephalic and atraumatic.  Eyes:     Extraocular Movements: Extraocular movements intact.     Conjunctiva/sclera: Conjunctivae normal.     Pupils: Pupils are equal, round, and reactive to light.  Cardiovascular:     Rate and Rhythm: Normal rate and regular rhythm.     Pulses: Normal pulses.     Heart sounds: Normal heart sounds.     Comments: 2+ bilateral radial/dorsalis pedis pulses with regular rate Pulmonary:     Effort: Pulmonary effort is normal. No respiratory distress.     Breath sounds: Rhonchi (Bilateral lower lobes) present.  Abdominal:     Palpations: Abdomen is soft.     Tenderness: There is  no abdominal tenderness. There is no guarding or rebound.  Musculoskeletal:        General: Normal range of motion.     Cervical back: Normal range of motion and neck supple.     Comments: 5 out of 5 bilateral grip/leg extension strength 1+ bilateral pitting edema  Skin:    General: Skin is warm and dry.     Capillary Refill: Capillary refill takes less than 2 seconds.  Neurological:     General: No focal deficit present.     Mental Status: She is alert and oriented to person, place, and time.     Comments: Sensation intact in all 4 limbs  Psychiatric:        Mood  and Affect: Mood normal.     ED Results / Procedures / Treatments   Labs (all labs ordered are listed, but only abnormal results are displayed) Labs Reviewed  BASIC METABOLIC PANEL - Abnormal; Notable for the following components:      Result Value   BUN 30 (*)    Creatinine, Ser 1.59 (*)    GFR, Estimated 32 (*)    All other components within normal limits  CBC - Abnormal; Notable for the following components:   RDW 16.0 (*)    Platelets 132 (*)    All other components within normal limits  BRAIN NATRIURETIC PEPTIDE - Abnormal; Notable for the following components:   B Natriuretic Peptide 814.6 (*)    All other components within normal limits  TROPONIN I (HIGH SENSITIVITY) - Abnormal; Notable for the following components:   Troponin I (High Sensitivity) 81 (*)    All other components within normal limits  TROPONIN I (HIGH SENSITIVITY)    EKG EKG Interpretation Date/Time:  Tuesday August 17 2023 13:20:26 EDT Ventricular Rate:  79 PR Interval:  212 QRS Duration:  79 QT Interval:  414 QTC Calculation: 475 R Axis:   95  Text Interpretation: Sinus rhythm Borderline prolonged PR interval Probable left atrial enlargement Low voltage with right axis deviation Confirmed by Vanetta Mulders [id number] on 08/17/2023 1:39:06 PM  Radiology DG Chest 2 View  Result Date: 08/17/2023 CLINICAL DATA:  chest pain EXAM:  CHEST - 2 VIEW COMPARISON:  06/11/2023. FINDINGS: Left retrocardiac opacities favored to represent atelectasis. Bilateral lung fields are otherwise clear. Bilateral costophrenic angles are clear. Stable cardio-mediastinal silhouette. No acute osseous abnormalities. The soft tissues are within normal limits. IMPRESSION: 1. Left retrocardiac atelectasis. Electronically Signed   By: Jules Schick M.D.   On: 08/17/2023 15:32    Procedures Procedures    Medications Ordered in ED Medications  furosemide (LASIX) injection 80 mg (has no administration in time range)    ED Course/ Medical Decision Making/ A&P                                 Medical Decision Making Amount and/or Complexity of Data Reviewed Labs: ordered. Radiology: ordered.  Risk Prescription drug management.   Donna Fuller 82 y.o. presented today for shortness of breath.  Working DDx that I considered at this time includes, but not limited to, CHF exacerbation, asthma/COPD exacerbation, URI, viral illness, anemia, ACS, PE, pneumonia, pleural effusion, lung cancer.  R/o DDx: asthma/COPD exacerbation, URI, viral illness, anemia, ACS, PE, pneumonia, pleural effusion, lung cancer: These are considered less likely due to history of present illness, physical exam, labs/imaging findings  Review of prior external notes: 08/13/2023 telephone  Unique Tests and My Interpretation:  CBC: Unremarkable BMP: Unremarkable EKG: 79 bpm, first-degree heart block, no ST elevation or depression noted Troponin: 81, this appears to be baseline BNP: 814.6 CXR: Pending  Discussion with Independent Historian: None  Discussion of Management of Tests:  Bing Matter, MD Cardiologist  Risk: Medium: prescription drug management  Risk Stratification Score:  None  Staffed with Deretha Emory, MD  Plan: On exam patient was in no acute distress with stable vitals.  Patient did have 1+ bilateral pitting edema on exam but otherwise her exam was  unremarkable.  Patient is endorsing DOE along with orthopnea in conjunction with recent CHF diagnosis suspect patient is having a CHF exacerbation.  Labs were ordered along with chest x-ray.  Patient verbalized that she wants to go home does not want to be hospitalized.  Upon chart review it appears that patient cannot take any more of her furosemide 10 mg as her kidney function prevents her.  I consulted patient's cardiologist who happens to be in the building at this time and he went to evaluate patient as patient stated that patient does appear fluid overloaded and does agree with the initial physical exam.  Due to patient not wanting to be admitted for diuresis cardiologist recommends 80 mg Lasix IV and to reassess as he agrees that she is most likely in a CHF exacerbation.  Patient signed out to Glyn Ade, MD.  Please review their note for the continuation of patient's care.  The plan at this point is to receive the IV Lasix and reassess.  Patient stable at this time.          Final Clinical Impression(s) / ED Diagnoses Final diagnoses:  Acute on chronic congestive heart failure, unspecified heart failure type [facility])    Rx / DC Orders ED Discharge Orders     None         Remi Deter 08/17/23 1535    [address] PA-C 08/17/23 1536    Glyn Ade, MD 08/17/23 [phone]

## 2023-08-18 ENCOUNTER — Telehealth: Payer: Self-pay

## 2023-08-18 ENCOUNTER — Telehealth: Payer: Self-pay | Admitting: Cardiology

## 2023-08-18 DIAGNOSIS — I5032 Chronic diastolic (congestive) heart failure: Secondary | ICD-10-CM

## 2023-08-18 DIAGNOSIS — R0609 Other forms of dyspnea: Secondary | ICD-10-CM

## 2023-08-18 LAB — LAB REPORT - SCANNED
EGFR: 28
Microalb Creat Ratio: 116

## 2023-08-18 NOTE — Telephone Encounter (Signed)
Patient states when she was in the ER they wanted her to have blood work done. But there is no order. Please advise

## 2023-08-18 NOTE — Telephone Encounter (Signed)
Spoke with pt. She stated that she was in the ED yesterday and they want labs in 1 week. Per Dr. Bing Matter BMP & ProBNP ordered.

## 2023-08-20 DIAGNOSIS — E113392 Type 2 diabetes mellitus with moderate nonproliferative diabetic retinopathy without macular edema, left eye: Secondary | ICD-10-CM | POA: Diagnosis not present

## 2023-08-20 DIAGNOSIS — Z7985 Long-term (current) use of injectable non-insulin antidiabetic drugs: Secondary | ICD-10-CM | POA: Diagnosis not present

## 2023-08-20 DIAGNOSIS — M5136 Other intervertebral disc degeneration, lumbar region: Secondary | ICD-10-CM | POA: Diagnosis not present

## 2023-08-20 DIAGNOSIS — M51369 Other intervertebral disc degeneration, lumbar region without mention of lumbar back pain or lower extremity pain: Secondary | ICD-10-CM | POA: Diagnosis not present

## 2023-08-20 DIAGNOSIS — Z96653 Presence of artificial knee joint, bilateral: Secondary | ICD-10-CM | POA: Diagnosis not present

## 2023-08-20 DIAGNOSIS — M47817 Spondylosis without myelopathy or radiculopathy, lumbosacral region: Secondary | ICD-10-CM | POA: Diagnosis not present

## 2023-08-20 DIAGNOSIS — N1831 Chronic kidney disease, stage 3a: Secondary | ICD-10-CM | POA: Diagnosis not present

## 2023-08-20 DIAGNOSIS — E785 Hyperlipidemia, unspecified: Secondary | ICD-10-CM | POA: Diagnosis not present

## 2023-08-20 DIAGNOSIS — M4800 Spinal stenosis, site unspecified: Secondary | ICD-10-CM | POA: Diagnosis not present

## 2023-08-20 DIAGNOSIS — I25118 Atherosclerotic heart disease of native coronary artery with other forms of angina pectoris: Secondary | ICD-10-CM | POA: Diagnosis not present

## 2023-08-20 DIAGNOSIS — M7062 Trochanteric bursitis, left hip: Secondary | ICD-10-CM | POA: Diagnosis not present

## 2023-08-20 DIAGNOSIS — M4124 Other idiopathic scoliosis, thoracic region: Secondary | ICD-10-CM | POA: Diagnosis not present

## 2023-08-20 DIAGNOSIS — K219 Gastro-esophageal reflux disease without esophagitis: Secondary | ICD-10-CM | POA: Diagnosis not present

## 2023-08-20 DIAGNOSIS — M1990 Unspecified osteoarthritis, unspecified site: Secondary | ICD-10-CM | POA: Diagnosis not present

## 2023-08-20 DIAGNOSIS — M05761 Rheumatoid arthritis with rheumatoid factor of right knee without organ or systems involvement: Secondary | ICD-10-CM | POA: Diagnosis not present

## 2023-08-20 DIAGNOSIS — I130 Hypertensive heart and chronic kidney disease with heart failure and stage 1 through stage 4 chronic kidney disease, or unspecified chronic kidney disease: Secondary | ICD-10-CM | POA: Diagnosis not present

## 2023-08-20 DIAGNOSIS — I5033 Acute on chronic diastolic (congestive) heart failure: Secondary | ICD-10-CM | POA: Diagnosis not present

## 2023-08-20 DIAGNOSIS — Z7984 Long term (current) use of oral hypoglycemic drugs: Secondary | ICD-10-CM | POA: Diagnosis not present

## 2023-08-20 DIAGNOSIS — E1122 Type 2 diabetes mellitus with diabetic chronic kidney disease: Secondary | ICD-10-CM | POA: Diagnosis not present

## 2023-08-20 DIAGNOSIS — M05762 Rheumatoid arthritis with rheumatoid factor of left knee without organ or systems involvement: Secondary | ICD-10-CM | POA: Diagnosis not present

## 2023-08-20 DIAGNOSIS — E1142 Type 2 diabetes mellitus with diabetic polyneuropathy: Secondary | ICD-10-CM | POA: Diagnosis not present

## 2023-08-20 DIAGNOSIS — M5416 Radiculopathy, lumbar region: Secondary | ICD-10-CM | POA: Diagnosis not present

## 2023-08-20 DIAGNOSIS — Z7982 Long term (current) use of aspirin: Secondary | ICD-10-CM | POA: Diagnosis not present

## 2023-08-20 DIAGNOSIS — M1030 Gout due to renal impairment, unspecified site: Secondary | ICD-10-CM | POA: Diagnosis not present

## 2023-08-20 DIAGNOSIS — H401132 Primary open-angle glaucoma, bilateral, moderate stage: Secondary | ICD-10-CM | POA: Diagnosis not present

## 2023-08-24 ENCOUNTER — Telehealth: Payer: Self-pay | Admitting: Medical

## 2023-08-24 NOTE — Telephone Encounter (Signed)
Prescription Request  08/24/2023  Is this a "Controlled Substance" medicine? No  LOV: 06/18/2023  What is the name of the medication or equipment? carvedilol (COREG) 12.5 MG tablet   Have you contacted your pharmacy to request a refill? No   Which pharmacy would you like this sent to?  [id number], [place] - [address] [place] [address] Phone: [phone] Fax: [phone]   Patient notified that their request is being sent to the clinical staff for review and that they should receive a response within 2 business days.   Please advise at [facility] [phone]

## 2023-08-25 DIAGNOSIS — I5032 Chronic diastolic (congestive) heart failure: Secondary | ICD-10-CM | POA: Diagnosis not present

## 2023-08-25 DIAGNOSIS — R0609 Other forms of dyspnea: Secondary | ICD-10-CM | POA: Diagnosis not present

## 2023-08-25 MED ORDER — CARVEDILOL 12.5 MG PO TABS: 12.5000 mg | ORAL_TABLET | Freq: Two times a day (BID) | ORAL | 3 refills | Status: DC

## 2023-08-25 NOTE — Addendum Note (Signed)
Addended by: Eleonore Chiquito on: 08/25/2023 08:51 AM   Modules accepted: Orders

## 2023-08-26 LAB — BASIC METABOLIC PANEL
BUN/Creatinine Ratio: 15 (ref 12–28)
BUN: 28 mg/dL — ABNORMAL HIGH (ref 8–27)
CO2: 25 mmol/L (ref 20–29)
Calcium: 9.7 mg/dL (ref 8.7–10.3)
Chloride: 103 mmol/L (ref 96–106)
Creatinine, Ser: 1.87 mg/dL — ABNORMAL HIGH (ref 0.57–1.00)
Glucose: 117 mg/dL — ABNORMAL HIGH (ref 70–99)
Potassium: 4.9 mmol/L (ref 3.5–5.2)
Sodium: 145 mmol/L — ABNORMAL HIGH (ref 134–144)
eGFR: 27 mL/min/{1.73_m2} — ABNORMAL LOW (ref >59)

## 2023-08-26 LAB — PRO B NATRIURETIC PEPTIDE: NT-Pro BNP: 3467 pg/mL — ABNORMAL HIGH (ref 0–738)

## 2023-08-30 ENCOUNTER — Telehealth: Payer: Self-pay | Admitting: *Deleted

## 2023-08-30 MED ORDER — EZETIMIBE 10 MG PO TABS: 10.0000 mg | ORAL_TABLET | Freq: Every day | ORAL | 3 refills | Status: DC

## 2023-08-30 MED ORDER — ASPIRIN 81 MG PO TBEC: 81.0000 mg | DELAYED_RELEASE_TABLET | Freq: Every day | ORAL | 3 refills | Status: DC

## 2023-08-30 MED ORDER — CARVEDILOL 12.5 MG PO TABS: 12.5000 mg | ORAL_TABLET | Freq: Two times a day (BID) | ORAL | 3 refills | Status: DC

## 2023-08-30 NOTE — Telephone Encounter (Signed)
Received fax from [facility] to send in meds we prescribe to them. Called pt to verify this was okay and she confirmed this request. Sent in meds we prescribe to new pharmacy

## 2023-08-31 ENCOUNTER — Other Ambulatory Visit: Payer: Self-pay

## 2023-08-31 ENCOUNTER — Telehealth: Payer: Self-pay

## 2023-08-31 DIAGNOSIS — I10 Essential (primary) hypertension: Secondary | ICD-10-CM

## 2023-08-31 DIAGNOSIS — E1142 Type 2 diabetes mellitus with diabetic polyneuropathy: Secondary | ICD-10-CM

## 2023-08-31 MED ORDER — TRULICITY 1.5 MG/0.5ML ~~LOC~~ SOAJ: 1.5000 mg | SUBCUTANEOUS | 3 refills | Status: DC

## 2023-08-31 MED ORDER — GLIPIZIDE 5 MG PO TABS: 5.0000 mg | ORAL_TABLET | Freq: Two times a day (BID) | ORAL | 1 refills | Status: DC

## 2023-08-31 NOTE — Telephone Encounter (Signed)
Lab Results reviewed with pt as per Dr. Vanetta Shawl note.She will come for Blood work in 2 weeks or get blood work at her PCP office.  Pt verbalized understanding and had no additional questions. Routed to PCP

## 2023-09-01 ENCOUNTER — Other Ambulatory Visit: Payer: Self-pay

## 2023-09-01 ENCOUNTER — Telehealth: Payer: Self-pay | Admitting: Medical

## 2023-09-01 MED ORDER — ALCOHOL PREP PAD 70 % PADS: MEDICATED_PAD | 3 refills | Status: AC

## 2023-09-01 NOTE — Telephone Encounter (Signed)
Caller/Agency: Judeth Cornfield [facility]) Callback Number: [phone] Requesting OT/PT/Skilled Nursing/Social Work/Speech Therapy: OT Frequency: 1 w 6

## 2023-09-02 ENCOUNTER — Telehealth: Payer: Self-pay | Admitting: Cardiology

## 2023-09-02 ENCOUNTER — Other Ambulatory Visit: Payer: Self-pay

## 2023-09-02 DIAGNOSIS — N3592 Unspecified urethral stricture, female: Secondary | ICD-10-CM | POA: Diagnosis not present

## 2023-09-02 MED ORDER — ASPIRIN 81 MG PO TBEC: 81.0000 mg | DELAYED_RELEASE_TABLET | Freq: Every day | ORAL | 3 refills | Status: AC

## 2023-09-02 NOTE — Telephone Encounter (Signed)
*  STAT* If patient is at the pharmacy, call can be transferred to refill team.   1. Which medications need to be refilled? (please list name of each medication and dose if known)  aspirin EC 81 MG tablet  2. Which pharmacy/location (including street and city if local pharmacy) is medication to be sent to? [facility] - fax#: [phone]  3. Do they need a 30 day or 90 day supply?   90 day supply

## 2023-09-02 NOTE — Telephone Encounter (Signed)
Sent refill for OTC Aspirin to [facility].

## 2023-09-03 ENCOUNTER — Telehealth: Payer: Self-pay | Admitting: Medical

## 2023-09-03 MED ORDER — TORSEMIDE 10 MG PO TABS: 10.0000 mg | ORAL_TABLET | Freq: Every day | ORAL | 0 refills | Status: DC

## 2023-09-03 NOTE — Telephone Encounter (Signed)
VO given on secure voicemail , orders placed in red folder

## 2023-09-03 NOTE — Telephone Encounter (Signed)
Prescription Request  09/03/2023  Is this a "Controlled Substance" medicine? No  LOV: 06/18/2023  What is the name of the medication or equipment?  torsemide (DEMADEX) 10 MG tablet    Have you contacted your pharmacy to request a refill? No - prescribed by ED provider  Which pharmacy would you like this sent to?  [id number], [place] - [address] [place] [address] Phone: [phone] Fax: [phone]    Patient notified that their request is being sent to the clinical staff for review and that they should receive a response within 2 business days.   Please advise at [facility] [phone]

## 2023-09-03 NOTE — Telephone Encounter (Signed)
Rx sent 

## 2023-09-03 NOTE — Addendum Note (Signed)
Addended by: Maximino Sarin on: 09/03/2023 10:35 AM   Modules accepted: Orders

## 2023-09-07 ENCOUNTER — Ambulatory Visit: Payer: PPO | Admitting: Medical

## 2023-09-08 NOTE — Addendum Note (Signed)
Addended by: Gwenevere Abbot on: 09/08/2023 11:54 AM   Modules accepted: Orders

## 2023-09-09 ENCOUNTER — Other Ambulatory Visit: Payer: Self-pay | Admitting: Medical

## 2023-09-09 DIAGNOSIS — N6099 Unspecified benign mammary dysplasia of unspecified breast: Secondary | ICD-10-CM

## 2023-09-13 NOTE — Addendum Note (Signed)
Addended by: Gwenevere Abbot on: 09/13/2023 12:03 PM   Modules accepted: Orders

## 2023-09-14 ENCOUNTER — Ambulatory Visit (HOSPITAL_BASED_OUTPATIENT_CLINIC_OR_DEPARTMENT_OTHER)
Admission: RE | Admit: 2023-09-14 | Discharge: 2023-09-14 | Disposition: A | Discharge status: Home / Self Care | Payer: PPO | Source: Ambulatory Visit | Attending: Medical | Admitting: Medical

## 2023-09-14 ENCOUNTER — Ambulatory Visit: Payer: PPO | Admitting: Medical

## 2023-09-14 VITALS — BP 126/62 | HR 85 | Temp 98.2°F | Resp 18 | Ht 61.0 in | Wt 189.0 lb

## 2023-09-14 DIAGNOSIS — I509 Heart failure, unspecified: Secondary | ICD-10-CM | POA: Diagnosis not present

## 2023-09-14 DIAGNOSIS — R944 Abnormal results of kidney function studies: Secondary | ICD-10-CM

## 2023-09-14 DIAGNOSIS — I5032 Chronic diastolic (congestive) heart failure: Secondary | ICD-10-CM | POA: Insufficient documentation

## 2023-09-14 DIAGNOSIS — R0600 Dyspnea, unspecified: Secondary | ICD-10-CM

## 2023-09-14 DIAGNOSIS — R0602 Shortness of breath: Secondary | ICD-10-CM | POA: Diagnosis not present

## 2023-09-14 DIAGNOSIS — I10 Essential (primary) hypertension: Secondary | ICD-10-CM

## 2023-09-14 DIAGNOSIS — I517 Cardiomegaly: Secondary | ICD-10-CM | POA: Diagnosis not present

## 2023-09-14 DIAGNOSIS — I700 Atherosclerosis of aorta: Secondary | ICD-10-CM | POA: Diagnosis not present

## 2023-09-14 LAB — COMPREHENSIVE METABOLIC PANEL
ALT: 9 U/L (ref 0–35)
AST: 14 U/L (ref 0–37)
Albumin: 4 g/dL (ref 3.5–5.2)
Alkaline Phosphatase: 123 U/L — ABNORMAL HIGH (ref 39–117)
BUN: 29 mg/dL — ABNORMAL HIGH (ref 6–23)
CO2: 31 meq/L (ref 19–32)
Calcium: 9.5 mg/dL (ref 8.4–10.5)
Chloride: 102 meq/L (ref 96–112)
Creatinine, Ser: 1.49 mg/dL — ABNORMAL HIGH (ref 0.40–1.20)
GFR: 32.69 mL/min — ABNORMAL LOW (ref >60.00)
Glucose, Bld: 121 mg/dL — ABNORMAL HIGH (ref 70–99)
Potassium: 4.5 meq/L (ref 3.5–5.1)
Sodium: 140 meq/L (ref 135–145)
Total Bilirubin: 0.8 mg/dL (ref 0.2–1.2)
Total Protein: 6.9 g/dL (ref 6.0–8.3)

## 2023-09-14 LAB — BRAIN NATRIURETIC PEPTIDE: Pro B Natriuretic peptide (BNP): 531 pg/mL — ABNORMAL HIGH (ref 0.0–100.0)

## 2023-09-14 NOTE — Patient Instructions (Addendum)
1. Decreased GFR -Increased urine microalbumin elevated and decreased gfr. You have upcoming appointment with nephrologist - Comp Met (CMET)  2. Chronic diastolic congestive heart failure (HCC) -follow chest xray and lab to see if chf flare occurring. Then determined if need torsemide. Need to approach this with caution due to kidney function - B Nat Peptide - DG Chest 2 View; Future  3. Hypertension, unspecified type -bp controlled. Continue coreg and losartan  4. Dyspnea, unspecified type - B Nat Peptide   5. High cholesterol- continue zetia.  Follow up date to be determined after lab review

## 2023-09-14 NOTE — Progress Notes (Signed)
Subjective:    Patient ID: Donna Fuller, female    DOB: 23-Aug-1941, 82 y.o.   MRN: [id number]  HPI  Pt in for follow up.  Last visit AVS with me below.  "1. Fatigue, unspecified type Will follow labs to see if find cause.  - CBC w/Diff - B12 - TSH   2. Decreased GFR Follow - CBC w/Diff - Comp Met (CMET)   3. Cardiomegaly with htn(but controlled) Continue torsemide 1 tab three time weekly., losartan 50 mg daily and coreg 12.5 mg daily and  - CBC w/Diff - CMP - keep checking weight daily. If gradually increasing let me know. If any greater than 2 lb weight gain in one day let me know as well.   4. Hyperlipidemia, unspecified hyperlipidemia type -refill pravasatin - Lipid panel    Mild intermittent dizziness. Normal neurologic exam. If dizziness worsening let me know.   Keep cardiologist appt next week"    Pt does have appointment with nephrologist next week. I just reviewed pt Urine micro albumin results came to me. I am asking pt to get her copy of that test to shows nephrologist.   Pt did see Dr. Bing Matter this summer.  " Essential hypertension blood pressure well-controlled we will continue present management. Coronary artery disease but nonobstructive based on cardiac catheterization from 2022.  Will continue monitoring.  She is on antiplatelet therapy which I will continue. Diastolic congestive heart failure.  She looks nicely compensated and euvolemic today on physical exam minimal swelling of lower extremities of all but no JVD no crackles.  The problem is her kidney dysfunction would prevent [place] from giving her more diuretic.  She checked her weight every day since her dose of diuretic has been reduced likely she did notice some increased weight but now seems to be stable I asked her to let us know if she will increase her weight by 2 pounds in 2 consecutive days.  She is already on beta-blocker however I will not be able to increase the dose of it because of  first-degree AV block, she is also on ARB which I will continue.  She is taking diuretic 3 times a week. Dyslipidemia: She accepted my offer to start taking Zetia 10 mg daily fasting lipid profile is TLT 6 weeks"  Dr. Dewayne Shorter office has ordered lab and pt will get those today. Pt states has follow up with cardiology Oct 06, 2023.  Pt feels some mild shortness of breath at times even with mild activity. Pt did go to ED mid September.   Last portion of that visit Reassessment was   Reassessment: Evaluated at bedside.  This is a 82 year old female presenting with a chief complaint of volume overload and shortness of breath in the setting of history of heart failure.  Cardiology provider came down and saw her at bedside and made recommendations for diuresis with IV Lasix and outpatient care and management if responsive. She has only been taking her torsemide once every other day.  Per chart she is was taking 3 times a day. She has had a robust response to the IV diuresis in the emergency room. Given this response.  Will start her on torsemide 10 mg daily for ongoing diuresis with plan for repeat blood work and follow-up with PCP or cardiology within 1 week.   On review pt med list is on torsemide 10 mg daily. Has been on this recently but she states ran out.  Pt thinks she is supposed  to be chorthalidone. She has been out of that for one.  Review of Systems  Constitutional:  Negative for chills, fatigue and fever.  Respiratory:  Negative for cough, chest tightness, wheezing and stridor.   Cardiovascular:  Negative for chest pain and palpitations.  Gastrointestinal:  Negative for abdominal pain, anal bleeding and constipation.  Genitourinary:  Negative for dysuria, flank pain and hematuria.  Musculoskeletal:  Negative for back pain and joint swelling.  Skin:  Negative for rash.  Neurological:  Negative for dizziness, seizures, weakness and light-headedness.  Hematological:  Negative for  adenopathy. Does not bruise/bleed easily.  Psychiatric/Behavioral:  Negative for behavioral problems and decreased concentration.      Past Medical History:  Diagnosis Date   Arthritis    Breast cancer [facility]) 2007   Right breast, s/p mastectomy   Chronic diastolic congestive heart failure (HCC)    per patient, dx by cardiology   Diabetes mellitus without complication (HCC)    GERD (gastroesophageal reflux disease)    Gout    Hyperlipidemia    Hypertension    Spinal stenosis      Social History   Socioeconomic History   Marital status: Married    Spouse name: Jimmy   Number of children: 3   Years of education: 12   Highest education level: Not on file  Occupational History   Not on file  Tobacco Use   Smoking status: Former    Types: Cigarettes    Passive exposure: Never   Smokeless tobacco: Never  Vaping Use   Vaping status: Never Used  Substance and Sexual Activity   Alcohol use: No   Drug use: No   Sexual activity: Not Currently  Other Topics Concern   Not on file  Social History Narrative   Marital Status:  Married Hydrologist)    Children:  Daughter(1) Son (1)    Pets:  None    Living Situation: Lives with husband and daughter.     Occupation:  Retired Contractor)    Education: 12th Grade    Tobacco Use/Exposure:  She used to smoke socially during the weekends but quit 40 years ago.    Alcohol Use:  Occasional   Drug Use:  None   Diet:  Regular   Exercise:  None   Hobbies:  Reading and playing volleyball             Social Determinants of Health   Financial Resource Strain: Low Risk  (10/01/2021)   Overall Financial Resource Strain (CARDIA)    Difficulty of Paying Living Expenses: Not hard at all  Food Insecurity: No Food Insecurity (06/12/2023)   Hunger Vital Sign    Worried About Running Out of Food in the Last Year: Never true    Ran Out of Food in the Last Year: Never true  Transportation Needs: No Transportation Needs (06/12/2023)   PRAPARE -  Administrator, Civil Service (Medical): No    Lack of Transportation (Non-Medical): No  Physical Activity: Inactive (10/01/2021)   Exercise Vital Sign    Days of Exercise per Week: 0 days    Minutes of Exercise per Session: 0 min  Stress: No Stress Concern Present (10/01/2021)   [employer] of Occupational Health - Occupational Stress Questionnaire    Feeling of Stress : Not at all  Social Connections: Moderately Integrated (10/01/2021)   Social Connection and Isolation Panel [NHANES]    Frequency of Communication with Friends and Family: More than three times a week  Frequency of Social Gatherings with Friends and Family: More than three times a week    Attends Religious Services: More than 4 times per year    Active Member of Clubs or Organizations: No    Attends Banker Meetings: Never    Marital Status: Married  Catering manager Violence: Not At Risk (06/12/2023)   Humiliation, Afraid, Rape, and Kick questionnaire    Fear of Current or Ex-Partner: No    Emotionally Abused: No    Physically Abused: No    Sexually Abused: No    Past Surgical History:  Procedure Laterality Date   ABDOMINAL HYSTERECTOMY     ABDOMINAL SURGERY     BREAST BIOPSY Left    2016 benign    BREAST BIOPSY Left 02/16/2023   MM LT BREAST BX W LOC DEV 1ST LESION IMAGE BX SPEC STEREO GUIDE 02/16/2023 [facility]   LEFT HEART CATH AND CORONARY ANGIOGRAPHY N/A 11/21/2021   Procedure: LEFT HEART CATH AND CORONARY ANGIOGRAPHY;  Surgeon: Iran Ouch, MD;  Location: [facility] INVASIVE CV LAB;  Service: Cardiovascular;  Laterality: N/A;   MASTECTOMY     right side   REPLACEMENT TOTAL KNEE Bilateral    TUBAL LIGATION      Family History  Problem Relation Age of Onset   Diabetes Mother    Alzheimer's disease Mother    Hypertension Mother    Hypertension Father    Heart disease Father    Hyperlipidemia Father    Kidney disease Father    Diabetes Sister    Cancer Brother         lung   Alzheimer's disease Brother    Heart disease Brother    Hypertension Brother    Diabetes Brother    Hypertension Brother    Diabetes Brother    Hyperlipidemia Son    Hypertension Son    Diabetes Son    Hypertension Daughter    Hyperlipidemia Daughter     Allergies  Allergen Reactions   Penicillins Swelling   Sulfa Antibiotics Swelling   Amlodipine Swelling    LE Swelling   Lisinopril Cough    Current Outpatient Medications on File Prior to Visit  Medication Sig Dispense Refill   acetaminophen (TYLENOL) 650 MG CR tablet Take 650 mg by mouth 2 (two) times daily.     albuterol (VENTOLIN HFA) 108 (90 Base) MCG/ACT inhaler INHALE 2 PUFFS BY MOUTH EVERY 6 HOURS AS NEEDED (Patient taking differently: Inhale 2 puffs into the lungs every 6 (six) hours as needed for wheezing or shortness of breath.) 8.5 each 2   Alcohol Swabs (ALCOHOL PREP PAD) 70 % PADS Use to check blood sugars 100 each 3   ascorbic acid (VITAMIN C) 1000 MG tablet Take 1,000 mg by mouth daily.     aspirin EC 81 MG tablet Take 1 tablet (81 mg total) by mouth daily. Swallow whole. 90 tablet 3   blood glucose meter kit and supplies KIT Dispense based on patient and insurance preference. Use up to four times daily as directed. (FOR ICD-9 250.00, 250.01). (Patient taking differently: Inject 1 each into the skin as directed. Dispense based on patient and insurance preference. Use up to four times daily as directed. (FOR ICD-9 250.00, 250.01).) 1 each 11   brimonidine (ALPHAGAN) 0.2 % ophthalmic solution Place 1 drop into both eyes 3 (three) times daily.     carvedilol (COREG) 12.5 MG tablet Take 1 tablet (12.5 mg total) by mouth 2 (two) times daily with  a meal. 180 tablet 3   Cholecalciferol (VITAMIN D-3 PO) Take 10,000 Units by mouth daily.     Cyanocobalamin (B-12 PO) Take 1 tablet by mouth daily.     Dulaglutide (TRULICITY) 1.5 MG/0.5ML SOPN Inject 1.5 mg into the skin once a week. 2 mL 3   ezetimibe (ZETIA) 10  MG tablet Take 1 tablet (10 mg total) by mouth daily. 90 tablet 3   gabapentin (NEURONTIN) 300 MG capsule TAKE 1 CAPSULE BY MOUTH THREE TIMES A DAY (Patient taking differently: Take 300 mg by mouth 3 (three) times daily. TAKE 1 CAPSULE BY MOUTH THREE TIMES A DAY) 90 capsule 2   glipiZIDE (GLUCOTROL) 5 MG tablet Take 1 tablet (5 mg total) by mouth 2 (two) times daily before a meal. 180 tablet 1   ketorolac (ACULAR) 0.5 % ophthalmic solution INSTILL 1 DROP INTO RIGHT EYE 4 TIMES A DAY (Patient taking differently: INSTILL 1 DROP INTO RIGHT EYE 3 TIMES A DAY) 5 mL 6   latanoprost (XALATAN) 0.005 % ophthalmic solution Place 1 drop into both eyes daily.     levocetirizine (XYZAL) 5 MG tablet TAKE 1 TABLET BY MOUTH EVERY DAY IN THE EVENING 90 tablet 1   losartan (COZAAR) 50 MG tablet TAKE 1 TABLET BY MOUTH EVERY DAY 90 tablet 3   omeprazole (PRILOSEC) 40 MG capsule Take 1 capsule (40 mg total) by mouth 2 (two) times daily. Please call [phone] to schedule an office visit for more refills. 180 capsule 0   OneTouch Delica Lancets 33G MISC Use as instructed to check blood sugar 2-3 times a day.  DX E11.9 (Patient taking differently: 1 each by Other route See admin instructions. Use as instructed to check blood sugar 2-3 times a day.  DX E11.9) 100 each 3   ONETOUCH ULTRA TEST test strip CHECK BLOOD SUGAR 2 TO 3 TIMES DAILY AS DIRECTED 100 strip 5   timolol (TIMOPTIC) 0.5 % ophthalmic solution Place 1 drop into both eyes 2 (two) times a day.     torsemide (DEMADEX) 10 MG tablet Take 1 tablet (10 mg total) by mouth daily. 30 tablet 0   No current facility-administered medications on file prior to visit.    BP 126/62   Pulse 85   Temp 98.2 F (36.8 C)   Resp 18   Ht 5\' 1"  (1.549 m)   Wt 189 lb (85.7 kg)   SpO2 96%   BMI 35.71 kg/m        Objective:   Physical Exam  General Mental Status- Alert. General Appearance- Not in acute distress.   Skin General: Color- Normal Color. Moisture- Normal  Moisture.  Neck Carotid Arteries- Normal color. Moisture- Normal Moisture. No carotid bruits. No JVD.  Chest and Lung Exam Auscultation: Breath Sounds:-Normal.  Cardiovascular Auscultation:Rythm- Regular. Murmurs & Other Heart Sounds:Auscultation of the heart reveals- No Murmurs.  Abdomen Inspection:-Inspeection Normal. Palpation/Percussion:Note:No mass. Palpation and Percussion of the abdomen reveal- Non Tender, Non Distended + BS, no rebound or guarding.   Neurologic Cranial Nerve exam:- CN III-XII intact(No nystagmus), symmetric smile. Strength:- 5/5 equal and symmetric strength both upper and lower extremities.       Assessment & Plan:   Patient Instructions  1. Decreased GFR -Increased urine microalbumin elevated and decreased gfr. You have upcoming appointment with nephrologist - Comp Met (CMET)  2. Chronic diastolic congestive heart failure (HCC) -follow chest xray and lab to see if chf flare occurring. Then determined if need torsemide. Need to approach this with  caution due to kidney function - B Nat Peptide - DG Chest 2 View; Future  3. Hypertension, unspecified type -bp controlled. Continue coreg and losartan  4. Dyspnea, unspecified type - B Nat Peptide   5. High cholesterol- continue zetia.  Follow up date to be determined after lab review   Esperanza Richters, PA-C

## 2023-09-15 ENCOUNTER — Other Ambulatory Visit: Payer: Self-pay | Admitting: Medical

## 2023-09-20 DIAGNOSIS — I129 Hypertensive chronic kidney disease with stage 1 through stage 4 chronic kidney disease, or unspecified chronic kidney disease: Secondary | ICD-10-CM | POA: Insufficient documentation

## 2023-09-20 DIAGNOSIS — N1832 Chronic kidney disease, stage 3b: Secondary | ICD-10-CM | POA: Diagnosis not present

## 2023-09-20 DIAGNOSIS — E1122 Type 2 diabetes mellitus with diabetic chronic kidney disease: Secondary | ICD-10-CM | POA: Diagnosis not present

## 2023-09-20 DIAGNOSIS — E785 Hyperlipidemia, unspecified: Secondary | ICD-10-CM | POA: Diagnosis not present

## 2023-09-20 DIAGNOSIS — N189 Chronic kidney disease, unspecified: Secondary | ICD-10-CM | POA: Insufficient documentation

## 2023-09-20 DIAGNOSIS — R8290 Unspecified abnormal findings in urine: Secondary | ICD-10-CM | POA: Diagnosis not present

## 2023-09-20 DIAGNOSIS — R809 Proteinuria, unspecified: Secondary | ICD-10-CM | POA: Diagnosis not present

## 2023-09-20 DIAGNOSIS — D631 Anemia in chronic kidney disease: Secondary | ICD-10-CM | POA: Diagnosis not present

## 2023-09-22 ENCOUNTER — Telehealth: Payer: Self-pay | Admitting: Cardiology

## 2023-09-22 NOTE — Telephone Encounter (Signed)
Pt c/o medication issue:  1. Name of Medication: Farxiga  2. How are you currently taking this medication (dosage and times per day)? 1 time q day in the morning  3. Are you having a reaction (difficulty breathing--STAT)?   4. What is your medication issue? Patient wants to know if Dr Bing Matter think   this medicine is alright for her to take? She said he r kidney doctor wants her to take this this

## 2023-09-23 NOTE — Telephone Encounter (Signed)
Recommendations reviewed with pt as per Dr. Krasowski's note.  Pt verbalized understanding and had no additional questions.  

## 2023-09-28 ENCOUNTER — Other Ambulatory Visit: Payer: Self-pay | Admitting: Medical

## 2023-09-28 ENCOUNTER — Ambulatory Visit
Admission: RE | Admit: 2023-09-28 | Discharge: 2023-09-28 | Disposition: A | Discharge status: Home / Self Care | Payer: PPO | Source: Ambulatory Visit | Attending: Medical | Admitting: Medical

## 2023-09-28 DIAGNOSIS — N6082 Other benign mammary dysplasias of left breast: Secondary | ICD-10-CM | POA: Diagnosis not present

## 2023-09-28 DIAGNOSIS — N6099 Unspecified benign mammary dysplasia of unspecified breast: Secondary | ICD-10-CM

## 2023-09-30 DIAGNOSIS — R8290 Unspecified abnormal findings in urine: Secondary | ICD-10-CM | POA: Diagnosis not present

## 2023-09-30 DIAGNOSIS — I129 Hypertensive chronic kidney disease with stage 1 through stage 4 chronic kidney disease, or unspecified chronic kidney disease: Secondary | ICD-10-CM | POA: Diagnosis not present

## 2023-09-30 DIAGNOSIS — E785 Hyperlipidemia, unspecified: Secondary | ICD-10-CM | POA: Diagnosis not present

## 2023-09-30 DIAGNOSIS — E1122 Type 2 diabetes mellitus with diabetic chronic kidney disease: Secondary | ICD-10-CM | POA: Diagnosis not present

## 2023-09-30 DIAGNOSIS — D631 Anemia in chronic kidney disease: Secondary | ICD-10-CM | POA: Diagnosis not present

## 2023-09-30 DIAGNOSIS — R809 Proteinuria, unspecified: Secondary | ICD-10-CM | POA: Diagnosis not present

## 2023-09-30 DIAGNOSIS — N1832 Chronic kidney disease, stage 3b: Secondary | ICD-10-CM | POA: Diagnosis not present

## 2023-10-01 ENCOUNTER — Ambulatory Visit (INDEPENDENT_AMBULATORY_CARE_PROVIDER_SITE_OTHER): Payer: PPO | Admitting: Medical

## 2023-10-01 VITALS — BP 114/66 | HR 74 | Resp 18 | Ht 61.0 in | Wt 184.2 lb

## 2023-10-01 DIAGNOSIS — L089 Local infection of the skin and subcutaneous tissue, unspecified: Secondary | ICD-10-CM | POA: Diagnosis not present

## 2023-10-01 DIAGNOSIS — R059 Cough, unspecified: Secondary | ICD-10-CM | POA: Diagnosis not present

## 2023-10-01 DIAGNOSIS — R6883 Chills (without fever): Secondary | ICD-10-CM

## 2023-10-01 LAB — POCT INFLUENZA A/B
Influenza A, POC: NEGATIVE
Influenza B, POC: NEGATIVE

## 2023-10-01 LAB — POC COVID19 BINAXNOW: SARS Coronavirus 2 Ag: NEGATIVE

## 2023-10-01 MED ORDER — CLINDAMYCIN HCL 150 MG PO CAPS: 150.0000 mg | ORAL_CAPSULE | Freq: Three times a day (TID) | ORAL | 0 refills | Status: DC

## 2023-10-01 MED ORDER — FLUTICASONE PROPIONATE 50 MCG/ACT NA SUSP
2.0000 | Freq: Every day | NASAL | 1 refills | Status: DC
Start: 2023-10-01 — End: 2023-10-25

## 2023-10-01 MED ORDER — BENZONATATE 100 MG PO CAPS: 100.0000 mg | ORAL_CAPSULE | Freq: Three times a day (TID) | ORAL | 0 refills | Status: DC | PRN

## 2023-10-01 NOTE — Addendum Note (Signed)
Addended by: Thelma Barge D on: 10/01/2023 03:05 PM   Modules accepted: Orders

## 2023-10-01 NOTE — Patient Instructions (Addendum)
1. Cough, unspecified type(uri vs allergic rhinitis) Flu and covid test negative -rx flonase for nasal congestion. Benzonatate for cough  2. Skin infection -small broken down area in pernineum area. Stop farxiga.  - Wound culture  -rx clindamycin antibiotic. -probiotic rich diet and can use probiotic tab over the counter  For diabetes continue other meds endocrinologist rx'd but stop farxiga due to potential side effect infection -follow up with endocrinologist as planned/scheduled.  Follow up  next wed with me or sooner if needed

## 2023-10-01 NOTE — Progress Notes (Signed)
Subjective:    Patient ID: Donna Fuller, female    DOB: [birth year], 82 y.o.   MRN: [id number]  HPI Pt in for question about concern side effect of farxiga. She describes some labial swelling taht was severe. She states was severe. She states drained and popped on Sunday evening and she thought had odor.. she saw some blood on depends.   Pt blood sugar level was 121 2 weeks ago. She states area started to swell on Sunday.   Pt is seeing Dr. Lonzo Cloud.   Pt states nephrologist gave her farxiga per pt.     Review of Systems  Constitutional:  Positive for diaphoresis. Negative for chills, fatigue and fever.       Sunday did have some sweats.  HENT:  Positive for sore throat.   Respiratory:  Positive for cough. Negative for chest tightness, shortness of breath and wheezing.        Cough for one day.  Cardiovascular:  Negative for chest pain and palpitations.  Gastrointestinal:  Negative for abdominal pain.  Genitourinary:  Negative for dysuria.  Musculoskeletal:  Negative for back pain.  Skin:  Negative for rash.  Neurological:  Negative for facial asymmetry and light-headedness.  Hematological:  Negative for adenopathy. Does not bruise/bleed easily.  Psychiatric/Behavioral:  Negative for behavioral problems and decreased concentration.    Past Medical History:  Diagnosis Date   Arthritis    Breast cancer [facility]) 2007   Right breast, s/p mastectomy   Chronic diastolic congestive heart failure (HCC)    per patient, dx by cardiology   Diabetes mellitus without complication (HCC)    GERD (gastroesophageal reflux disease)    Gout    Hyperlipidemia    Hypertension    Spinal stenosis      Social History   Socioeconomic History   Marital status: Married    Spouse name: Jimmy   Number of children: 3   Years of education: 12   Highest education level: Not on file  Occupational History   Not on file  Tobacco Use   Smoking status: Former    Types: Cigarettes    Passive  exposure: Never   Smokeless tobacco: Never  Vaping Use   Vaping status: Never Used  Substance and Sexual Activity   Alcohol use: No   Drug use: No   Sexual activity: Not Currently  Other Topics Concern   Not on file  Social History Narrative   Marital Status:  Married Hydrologist)    Children:  Daughter(1) Son (1)    Pets:  None    Living Situation: Lives with husband and daughter.     Occupation:  Retired Contractor)    Education: 12th Grade    Tobacco Use/Exposure:  She used to smoke socially during the weekends but quit 40 years ago.    Alcohol Use:  Occasional   Drug Use:  None   Diet:  Regular   Exercise:  None   Hobbies:  Reading and playing volleyball             Social Determinants of Health   Financial Resource Strain: Low Risk  (10/01/2021)   Overall Financial Resource Strain (CARDIA)    Difficulty of Paying Living Expenses: Not hard at all  Food Insecurity: No Food Insecurity (06/12/2023)   Hunger Vital Sign    Worried About Running Out of Food in the Last Year: Never true    Ran Out of Food in the Last Year: Never true  Transportation  Needs: No Transportation Needs (06/12/2023)   PRAPARE - Administrator, Civil Service (Medical): No    Lack of Transportation (Non-Medical): No  Physical Activity: Inactive (10/01/2021)   Exercise Vital Sign    Days of Exercise per Week: 0 days    Minutes of Exercise per Session: 0 min  Stress: No Stress Concern Present (10/01/2021)   [employer] of Occupational Health - Occupational Stress Questionnaire    Feeling of Stress : Not at all  Social Connections: Moderately Integrated (10/01/2021)   Social Connection and Isolation Panel [NHANES]    Frequency of Communication with Friends and Family: More than three times a week    Frequency of Social Gatherings with Friends and Family: More than three times a week    Attends Religious Services: More than 4 times per year    Active Member of [employer] or Organizations: No     Attends Banker Meetings: Never    Marital Status: Married  Catering manager Violence: Not At Risk (06/12/2023)   Humiliation, Afraid, Rape, and Kick questionnaire    Fear of Current or Ex-Partner: No    Emotionally Abused: No    Physically Abused: No    Sexually Abused: No    Past Surgical History:  Procedure Laterality Date   ABDOMINAL HYSTERECTOMY     ABDOMINAL SURGERY     BREAST BIOPSY Left    2016 benign    BREAST BIOPSY Left 02/16/2023   MM LT BREAST BX W LOC DEV 1ST LESION IMAGE BX SPEC STEREO GUIDE 02/16/2023 [facility]   LEFT HEART CATH AND CORONARY ANGIOGRAPHY N/A 11/21/2021   Procedure: LEFT HEART CATH AND CORONARY ANGIOGRAPHY;  Surgeon: Iran Ouch, MD;  Location: [facility] INVASIVE CV LAB;  Service: Cardiovascular;  Laterality: N/A;   MASTECTOMY     right side   REPLACEMENT TOTAL KNEE Bilateral    TUBAL LIGATION      Family History  Problem Relation Age of Onset   Diabetes Mother    Alzheimer's disease Mother    Hypertension Mother    Hypertension Father    Heart disease Father    Hyperlipidemia Father    Kidney disease Father    Diabetes Sister    Cancer Brother        lung   Alzheimer's disease Brother    Heart disease Brother    Hypertension Brother    Diabetes Brother    Hypertension Brother    Diabetes Brother    Hyperlipidemia Son    Hypertension Son    Diabetes Son    Hypertension Daughter    Hyperlipidemia Daughter     Allergies  Allergen Reactions   Penicillins Swelling   Sulfa Antibiotics Swelling   Amlodipine Swelling    LE Swelling   Lisinopril Cough    Current Outpatient Medications on File Prior to Visit  Medication Sig Dispense Refill   acetaminophen (TYLENOL) 650 MG CR tablet Take 650 mg by mouth 2 (two) times daily.     albuterol (VENTOLIN HFA) 108 (90 Base) MCG/ACT inhaler INHALE 2 PUFFS BY MOUTH EVERY 6 HOURS AS NEEDED (Patient taking differently: Inhale 2 puffs into the lungs every 6 (six) hours as  needed for wheezing or shortness of breath.) 8.5 each 2   Alcohol Swabs (ALCOHOL PREP PAD) 70 % PADS Use to check blood sugars 100 each 3   ascorbic acid (VITAMIN C) 1000 MG tablet Take 1,000 mg by mouth daily.     aspirin EC  81 MG tablet Take 1 tablet (81 mg total) by mouth daily. Swallow whole. 90 tablet 3   blood glucose meter kit and supplies KIT Dispense based on patient and insurance preference. Use up to four times daily as directed. (FOR ICD-9 250.00, 250.01). (Patient taking differently: Inject 1 each into the skin as directed. Dispense based on patient and insurance preference. Use up to four times daily as directed. (FOR ICD-9 250.00, 250.01).) 1 each 11   brimonidine (ALPHAGAN) 0.2 % ophthalmic solution Place 1 drop into both eyes 3 (three) times daily.     carvedilol (COREG) 12.5 MG tablet Take 1 tablet (12.5 mg total) by mouth 2 (two) times daily with a meal. 180 tablet 3   Cholecalciferol (VITAMIN D-3 PO) Take 10,000 Units by mouth daily.     Cyanocobalamin (B-12 PO) Take 1 tablet by mouth daily.     Dulaglutide (TRULICITY) 1.5 MG/0.5ML SOPN Inject 1.5 mg into the skin once a week. 2 mL 3   ezetimibe (ZETIA) 10 MG tablet Take 1 tablet (10 mg total) by mouth daily. 90 tablet 3   gabapentin (NEURONTIN) 300 MG capsule Take 1 capsule (300 mg total) by mouth 3 (three) times daily. 270 capsule 1   glipiZIDE (GLUCOTROL) 5 MG tablet Take 1 tablet (5 mg total) by mouth 2 (two) times daily before a meal. 180 tablet 1   ketorolac (ACULAR) 0.5 % ophthalmic solution INSTILL 1 DROP INTO RIGHT EYE 4 TIMES A DAY (Patient taking differently: INSTILL 1 DROP INTO RIGHT EYE 3 TIMES A DAY) 5 mL 6   latanoprost (XALATAN) 0.005 % ophthalmic solution Place 1 drop into both eyes daily.     levocetirizine (XYZAL) 5 MG tablet TAKE 1 TABLET BY MOUTH EVERY DAY IN THE EVENING 90 tablet 1   losartan (COZAAR) 50 MG tablet TAKE 1 TABLET BY MOUTH EVERY DAY 90 tablet 3   omeprazole (PRILOSEC) 40 MG capsule Take 1  capsule (40 mg total) by mouth 2 (two) times daily. Please call [phone] to schedule an office visit for more refills. 180 capsule 0   OneTouch Delica Lancets 33G MISC Use as instructed to check blood sugar 2-3 times a day.  DX E11.9 (Patient taking differently: 1 each by Other route See admin instructions. Use as instructed to check blood sugar 2-3 times a day.  DX E11.9) 100 each 3   ONETOUCH ULTRA TEST test strip CHECK BLOOD SUGAR 2 TO 3 TIMES DAILY AS DIRECTED 100 strip 5   timolol (TIMOPTIC) 0.5 % ophthalmic solution Place 1 drop into both eyes 2 (two) times a day.     torsemide (DEMADEX) 10 MG tablet Take 1 tablet (10 mg total) by mouth daily. 30 tablet 0   No current facility-administered medications on file prior to visit.    BP 114/66   Pulse 74   Resp 18   Ht 5\' 1"  (1.549 m)   Wt 184 lb 3.2 oz (83.6 kg)   SpO2 99%   BMI 34.80 kg/m        Objective:   Physical Exam   General Mental Status- Alert. General Appearance- Not in acute distress.   Skin General: Color- Normal Color. Moisture- Normal Moisture.  Neck Carotid Arteries- Normal color. Moisture- Normal Moisture. No carotid bruits. No JVD.  Chest and Lung Exam Auscultation: Breath Sounds:-Normal.  Cardiovascular Auscultation:Rythm- Regular. Murmurs & Other Heart Sounds:Auscultation of the heart reveals- No Murmurs.  Abdomen Inspection:-Inspeection Normal. Palpation/Percussion:Note:No mass. Palpation and Percussion of the abdomen reveal- Non  Tender, Non Distended + BS, no rebound or guarding.   Neurologic Cranial Nerve exam:- CN III-XII intact(No nystagmus), symmetric smile. Strength:- 5/5 equal and symmetric strength both upper and lower extremities.      Assessment & Plan:   Patient Instructions  1. Cough, unspecified type(uri vs allergic rhinitis) Flu and covid test negative -rx flonase for nasal congestion. Benzonatate for cough  2. Skin infection -small broken down area in pernineum area.  Stop farxiga.  - Wound culture  -rx clindamycin antibiotic. -probiotic rich diet and can use probiotic tab over the counter  For diabetes continue other meds endocrinologist rx'd but stop farxiga due to potential side effect infection -follow up with endocrinologist as planned/scheduled.  Follow up  next wed with me or sooner if needed   [employer], PA-C

## 2023-10-02 ENCOUNTER — Encounter: Payer: Self-pay | Admitting: Medical

## 2023-10-02 LAB — CBC WITH DIFFERENTIAL/PLATELET
Absolute Lymphocytes: 2153 {cells}/uL (ref 850–3900)
Absolute Monocytes: 752 {cells}/uL (ref 200–950)
Basophils Absolute: 48 {cells}/uL (ref 0–200)
Basophils Relative: 0.7 %
Eosinophils Absolute: 193 {cells}/uL (ref 15–500)
Eosinophils Relative: 2.8 %
HCT: 40.5 % (ref 35.0–45.0)
Hemoglobin: 13 g/dL (ref 11.7–15.5)
MCH: 29.3 pg (ref 27.0–33.0)
MCHC: 32.1 g/dL (ref 32.0–36.0)
MCV: 91.2 fL (ref 80.0–100.0)
MPV: 10.7 fL (ref 7.5–12.5)
Monocytes Relative: 10.9 %
Neutro Abs: 3754 {cells}/uL (ref 1500–7800)
Neutrophils Relative %: 54.4 %
Platelets: 177 10*3/uL (ref 140–400)
RBC: 4.44 10*6/uL (ref 3.80–5.10)
RDW: 14.7 % (ref 11.0–15.0)
Total Lymphocyte: 31.2 %
WBC: 6.9 10*3/uL (ref 3.8–10.8)

## 2023-10-04 LAB — WOUND CULTURE
MICRO NUMBER:: 15675344
SPECIMEN QUALITY:: ADEQUATE

## 2023-10-06 ENCOUNTER — Ambulatory Visit: Payer: PPO | Admitting: Medical

## 2023-10-06 ENCOUNTER — Encounter: Payer: Self-pay | Admitting: Obstetrics & Gynecology

## 2023-10-06 ENCOUNTER — Ambulatory Visit: Payer: PPO | Admitting: Obstetrics & Gynecology

## 2023-10-06 ENCOUNTER — Encounter: Payer: Self-pay | Admitting: Cardiology

## 2023-10-06 ENCOUNTER — Ambulatory Visit: Payer: PPO | Attending: Cardiology | Admitting: Cardiology

## 2023-10-06 VITALS — BP 128/80 | HR 95 | Ht 61.0 in | Wt 184.0 lb

## 2023-10-06 VITALS — BP 128/80 | HR 95 | Resp 18 | Ht 61.0 in | Wt 184.0 lb

## 2023-10-06 VITALS — BP 112/54 | HR 76 | Ht 61.0 in | Wt 186.0 lb

## 2023-10-06 DIAGNOSIS — N9089 Other specified noninflammatory disorders of vulva and perineum: Secondary | ICD-10-CM

## 2023-10-06 DIAGNOSIS — I5032 Chronic diastolic (congestive) heart failure: Secondary | ICD-10-CM

## 2023-10-06 DIAGNOSIS — E118 Type 2 diabetes mellitus with unspecified complications: Secondary | ICD-10-CM | POA: Diagnosis not present

## 2023-10-06 DIAGNOSIS — L089 Local infection of the skin and subcutaneous tissue, unspecified: Secondary | ICD-10-CM | POA: Diagnosis not present

## 2023-10-06 DIAGNOSIS — N1832 Chronic kidney disease, stage 3b: Secondary | ICD-10-CM

## 2023-10-06 DIAGNOSIS — I2510 Atherosclerotic heart disease of native coronary artery without angina pectoris: Secondary | ICD-10-CM | POA: Diagnosis not present

## 2023-10-06 DIAGNOSIS — I10 Essential (primary) hypertension: Secondary | ICD-10-CM

## 2023-10-06 MED ORDER — CLINDAMYCIN HCL 150 MG PO CAPS: 150.0000 mg | ORAL_CAPSULE | Freq: Three times a day (TID) | ORAL | 0 refills | Status: DC

## 2023-10-06 NOTE — Progress Notes (Signed)
Cardiology Office Note:    Date:  10/06/2023   ID:  Donna Fuller, DOB [birth year], MRN [id number]  PCP:  Donna Richters, PA-C  Cardiologist:  Donna Balsam, MD    Referring MD: Marisue Brooklyn   No chief complaint on file.   History of Present Illness:    Donna Fuller is a 82 y.o. female with past medical history significant for congestive heart failure which is diastolic in nature, coronary artery disease but only 20% stenosis of mid LAD based on cardiac catheterization from 2022, essential hypertension, longstanding diabetes.  She came today to my office for follow-up.  Last time I seen her she was just discharged from hospital.  Diuresed quite nicely and doing well euvolemic.  Comes today to months for follow-up complaint she has is a chronic back problem look like nothing can be done about this she said it bothers her a lot.  Denies have any chest pain tightness squeezing pressure burning chest.  Shortness of breath is as usual  Past Medical History:  Diagnosis Date   Arthritis    Breast cancer (HCC) 2007   Right breast, s/p mastectomy   Chronic diastolic congestive heart failure (HCC)    per patient, dx by cardiology   Diabetes mellitus without complication (HCC)    GERD (gastroesophageal reflux disease)    Gout    Hyperlipidemia    Hypertension    Spinal stenosis     Past Surgical History:  Procedure Laterality Date   ABDOMINAL HYSTERECTOMY     ABDOMINAL SURGERY     BREAST BIOPSY Left    2016 benign    BREAST BIOPSY Left 02/16/2023   MM LT BREAST BX W LOC DEV 1ST LESION IMAGE BX SPEC STEREO GUIDE 02/16/2023 [facility]   LEFT HEART CATH AND CORONARY ANGIOGRAPHY N/A 11/21/2021   Procedure: LEFT HEART CATH AND CORONARY ANGIOGRAPHY;  Surgeon: Iran Ouch, MD;  Location: [facility] INVASIVE CV LAB;  Service: Cardiovascular;  Laterality: N/A;   MASTECTOMY     right side   REPLACEMENT TOTAL KNEE Bilateral    TUBAL LIGATION      Current  Medications: Current Meds  Medication Sig   acetaminophen (TYLENOL) 650 MG CR tablet Take 650 mg by mouth 2 (two) times daily.   albuterol (VENTOLIN HFA) 108 (90 Base) MCG/ACT inhaler INHALE 2 PUFFS BY MOUTH EVERY 6 HOURS AS NEEDED (Patient taking differently: Inhale 2 puffs into the lungs every 6 (six) hours as needed for wheezing or shortness of breath.)   Alcohol Swabs (ALCOHOL PREP PAD) 70 % PADS Use to check blood sugars (Patient taking differently: 1 each by Other route See admin instructions. Use to check blood sugars)   ascorbic acid (VITAMIN C) 1000 MG tablet Take 1,000 mg by mouth daily.   aspirin EC 81 MG tablet Take 1 tablet (81 mg total) by mouth daily. Swallow whole.   benzonatate (TESSALON) 100 MG capsule Take 1 capsule (100 mg total) by mouth 3 (three) times daily as needed for cough.   blood glucose meter kit and supplies KIT Dispense based on patient and insurance preference. Use up to four times daily as directed. (FOR ICD-9 250.00, 250.01). (Patient taking differently: Inject 1 each into the skin as directed. Dispense based on patient and insurance preference. Use up to four times daily as directed. (FOR ICD-9 250.00, 250.01).)   brimonidine (ALPHAGAN) 0.2 % ophthalmic solution Place 1 drop into both eyes 3 (three) times daily.   carvedilol (COREG) 12.5  MG tablet Take 1 tablet (12.5 mg total) by mouth 2 (two) times daily with a meal.   Cholecalciferol (VITAMIN D-3 PO) Take 10,000 Units by mouth daily.   clindamycin (CLEOCIN) 150 MG capsule Take 1 capsule (150 mg total) by mouth 3 (three) times daily.   Cyanocobalamin (B-12 PO) Take 1 tablet by mouth daily.   Dulaglutide (TRULICITY) 1.5 MG/0.5ML SOPN Inject 1.5 mg into the skin once a week.   ezetimibe (ZETIA) 10 MG tablet Take 1 tablet (10 mg total) by mouth daily.   fluticasone (FLONASE) 50 MCG/ACT nasal spray Place 2 sprays into both nostrils daily.   gabapentin (NEURONTIN) 300 MG capsule Take 1 capsule (300 mg total) by mouth  3 (three) times daily.   glipiZIDE (GLUCOTROL) 5 MG tablet Take 1 tablet (5 mg total) by mouth 2 (two) times daily before a meal.   ketorolac (ACULAR) 0.5 % ophthalmic solution INSTILL 1 DROP INTO RIGHT EYE 4 TIMES A DAY (Patient taking differently: Place 1 drop into the right eye 4 (four) times daily. INSTILL 1 DROP INTO RIGHT EYE 3 TIMES A DAY)   latanoprost (XALATAN) 0.005 % ophthalmic solution Place 1 drop into both eyes daily.   levocetirizine (XYZAL) 5 MG tablet TAKE 1 TABLET BY MOUTH EVERY DAY IN THE EVENING (Patient taking differently: Take 5 mg by mouth every evening.)   losartan (COZAAR) 50 MG tablet TAKE 1 TABLET BY MOUTH EVERY DAY   omeprazole (PRILOSEC) 40 MG capsule Take 1 capsule (40 mg total) by mouth 2 (two) times daily. Please call [phone] to schedule an office visit for more refills.   OneTouch Delica Lancets 33G MISC Use as instructed to check blood sugar 2-3 times a day.  DX E11.9 (Patient taking differently: 1 each by Other route See admin instructions. Use as instructed to check blood sugar 2-3 times a day.  DX E11.9)   ONETOUCH ULTRA TEST test strip CHECK BLOOD SUGAR 2 TO 3 TIMES DAILY AS DIRECTED (Patient taking differently: 1 each by Other route as needed for other.)   timolol (TIMOPTIC) 0.5 % ophthalmic solution Place 1 drop into both eyes 2 (two) times a day.   torsemide (DEMADEX) 10 MG tablet Take 1 tablet (10 mg total) by mouth daily.     Allergies:   Penicillins, Sulfa antibiotics, Amlodipine, and Lisinopril   Social History   Socioeconomic History   Marital status: Married    Spouse name: Jimmy   Number of children: 3   Years of education: 12   Highest education level: Not on file  Occupational History   Not on file  Tobacco Use   Smoking status: Former    Types: Cigarettes    Passive exposure: Never   Smokeless tobacco: Never  Vaping Use   Vaping status: Never Used  Substance and Sexual Activity   Alcohol use: No   Drug use: No   Sexual  activity: Not Currently  Other Topics Concern   Not on file  Social History Narrative   Marital Status:  Married Hydrologist)    Children:  Daughter(1) Son (1)    Pets:  None    Living Situation: Lives with husband and daughter.     Occupation:  Retired Contractor)    Education: 12th Grade    Tobacco Use/Exposure:  She used to smoke socially during the weekends but quit 40 years ago.    Alcohol Use:  Occasional   Drug Use:  None   Diet:  Regular   Exercise:  None   Hobbies:  Reading and playing volleyball             Social Determinants of Health   Financial Resource Strain: Low Risk  (10/01/2021)   Overall Financial Resource Strain (CARDIA)    Difficulty of Paying Living Expenses: Not hard at all  Food Insecurity: No Food Insecurity (06/12/2023)   Hunger Vital Sign    Worried About Running Out of Food in the Last Year: Never true    Ran Out of Food in the Last Year: Never true  Transportation Needs: No Transportation Needs (06/12/2023)   PRAPARE - Administrator, Civil Service (Medical): No    Lack of Transportation (Non-Medical): No  Physical Activity: Inactive (10/01/2021)   Exercise Vital Sign    Days of Exercise per Week: 0 days    Minutes of Exercise per Session: 0 min  Stress: No Stress Concern Present (10/01/2021)   [employer] of Occupational Health - Occupational Stress Questionnaire    Feeling of Stress : Not at all  Social Connections: Moderately Integrated (10/01/2021)   Social Connection and Isolation Panel [NHANES]    Frequency of Communication with Friends and Family: More than three times a week    Frequency of Social Gatherings with Friends and Family: More than three times a week    Attends Religious Services: More than 4 times per year    Active Member of [employer] or Organizations: No    Attends Engineer, structural: Never    Marital Status: Married     Family History: The patient's family history includes Alzheimer's disease in her brother  and mother; Cancer in her brother; Diabetes in her brother, brother, mother, sister, and son; Heart disease in her brother and father; Hyperlipidemia in her daughter, father, and son; Hypertension in her brother, brother, daughter, father, mother, and son; Kidney disease in her father. ROS:   Please see the history of present illness.    All 14 point review of systems negative except as described per history of present illness  EKGs/Labs/Other Studies Reviewed:         Recent Labs: 06/18/2023: TSH 1.69 08/17/2023: B Natriuretic Peptide 814.6 09/14/2023: ALT 9; BUN 29; Creatinine, Ser 1.49; Potassium 4.5; Pro B Natriuretic peptide (BNP) 531.0; Sodium 140 10/01/2023: Hemoglobin 13.0; Platelets 177  Recent Lipid Panel    Component Value Date/Time   CHOL 112 08/06/2023 0935   TRIG 92 08/06/2023 0935   HDL 39 (L) 08/06/2023 0935   CHOLHDL 2.9 08/06/2023 0935   CHOLHDL 3.3 06/18/2023 1448   VLDL 21 05/31/2019 0039   LDLCALC 55 08/06/2023 0935   LDLCALC 68 06/18/2023 1448   LDLDIRECT 76.0 12/30/2016 1037    Physical Exam:    VS:  BP (!) 112/54 (BP Location: Left Arm, Patient Position: Sitting)   Pulse 76   Ht 5\' 1"  (1.549 m)   Wt 186 lb (84.4 kg)   SpO2 94%   BMI 35.14 kg/m     Wt Readings from Last 3 Encounters:  10/06/23 186 lb (84.4 kg)  10/01/23 184 lb 3.2 oz (83.6 kg)  09/14/23 189 lb (85.7 kg)     GEN:  Well nourished, well developed in no acute distress HEENT: Normal NECK: No JVD; No carotid bruits LYMPHATICS: No lymphadenopathy CARDIAC: RRR, no murmurs, no rubs, no gallops RESPIRATORY:  Clear to auscultation without rales, wheezing or rhonchi  ABDOMEN: Soft, non-tender, non-distended MUSCULOSKELETAL:  No edema; No deformity  SKIN: Warm and dry LOWER EXTREMITIES:  no swelling NEUROLOGIC:  Alert and oriented x 3 PSYCHIATRIC:  Normal affect   ASSESSMENT:    1. Coronary artery disease involving native coronary artery of native heart without angina pectoris   2.  Chronic diastolic congestive heart failure (HCC)   3. Primary hypertension   4. Diabetes mellitus type 2 with complications (HCC)   5. Stage 3b chronic kidney disease (HCC)    PLAN:    In order of problems listed above:  Coronary disease: Stable from that point review on guideline directed medical therapy which I will continue. Chronic diastolic congestive heart failure.  She looks euvolemic on my physical exam her weight is stable she is very particular about checking her weight every day we will continue.  Will continue her present dosages of medications. Essential hypertension: Blood pressure well-controlled. Diabetes mellitus followed by antimedicine team I did review K PN which show me data from   Medication Adjustments/Labs and Tests Ordered: Current medicines are reviewed at length with the patient today.  Concerns regarding medicines are outlined above.  No orders of the defined types were placed in this encounter.  Medication changes: No orders of the defined types were placed in this encounter.   Signed, Georgeanna Lea, MD, [facility] 10/06/2023 10:56 AM    [facility]

## 2023-10-06 NOTE — Patient Instructions (Signed)
Perineal skin infection. concern that abscess might form but not formed presently. Culture positive for Proteus mirabilis, sensitive to multiple antibiotics. Patient has history of allergies to penicillin and sulfa drugs. The abscess has drained and the wound has closed. There is a residual indurated area. -Continue Clindamycin for an additional 5 days. Can apply warm compress to slight indurated area -Refer to gynecology for evaluation and possible drainage procedure. Preferably Dr. Leighton Ruff if available. but ok'd Dr. Adrian Blackwater  Drug Interaction Potential interaction between Ciprofloxacin and Glipizide, and caution with Ciprofloxacin due to patient's kidney function. -Avoided prescribing Ciprofloxacin due to potential drug interaction and kidney function.   Follow up date to be determined. Recommended try to talk with gyn office today. point out my referral already placed.

## 2023-10-06 NOTE — Patient Instructions (Signed)

## 2023-10-06 NOTE — Progress Notes (Signed)
Patient presents to the office for a perineal skin infection. Referred here by PCP for possible incision and drainage.

## 2023-10-06 NOTE — Progress Notes (Signed)
Subjective:    Patient ID: Donna Fuller, female    DOB: [birth year], 82 y.o.   MRN: [id number]  HPI  Discussed the use of AI scribe software for clinical note transcription with the patient, who gave verbal consent to proceed.  History of Present Illness   The patient presents with a history of a perineal wound that had previously burst and bled, followed by a period of drainage accompanied by a foul odor. The wound was cultured and found to be colonized by Proteus mirabilis, a bacterium sensitive to multiple antibiotics. The patient was treated with clindamycin due to allergies to penicillin and sulfa drugs.  The patient reports that the wound has since closed and is no longer tender or draining. However, she notes a persistent hardness in the area of the wound that has not significantly changed since the initial presentation. The patient denies any other symptoms or changes in health.  The patient has a history of diabetes, managed with glipizide, and impaired kidney function. She has previously been treated with antibiotics for a yeast infection. The patient's medical history also includes an allergy to penicillin, which limits the choice of antibiotics for treatment.       Review of Systems  Constitutional:  Negative for chills, diaphoresis, fatigue and fever.       Sunday did have some sweats.  HENT:  Negative for sore throat.   Respiratory:  Negative for cough, chest tightness, shortness of breath and wheezing.        Cough for one day.  Cardiovascular:  Negative for chest pain and palpitations.  Gastrointestinal:  Negative for abdominal pain.  Genitourinary:  Negative for dysuria.       See hpi  Musculoskeletal:  Negative for back pain.  Skin:  Negative for rash.  Neurological:  Negative for facial asymmetry and light-headedness.  Hematological:  Negative for adenopathy. Does not bruise/bleed easily.  Psychiatric/Behavioral:  Negative for behavioral problems and decreased  concentration.    Past Medical History:  Diagnosis Date   Arthritis    Breast cancer [facility]) 2007   Right breast, s/p mastectomy   Chronic diastolic congestive heart failure (HCC)    per patient, dx by cardiology   Diabetes mellitus without complication (HCC)    GERD (gastroesophageal reflux disease)    Gout    Hyperlipidemia    Hypertension    Spinal stenosis      Social History   Socioeconomic History   Marital status: Married    Spouse name: Jimmy   Number of children: 3   Years of education: 12   Highest education level: Not on file  Occupational History   Not on file  Tobacco Use   Smoking status: Former    Types: Cigarettes    Passive exposure: Never   Smokeless tobacco: Never  Vaping Use   Vaping status: Never Used  Substance and Sexual Activity   Alcohol use: No   Drug use: No   Sexual activity: Not Currently  Other Topics Concern   Not on file  Social History Narrative   Marital Status:  Married Hydrologist)    Children:  Daughter(1) Son (1)    Pets:  None    Living Situation: Lives with husband and daughter.     Occupation:  Retired Contractor)    Education: 12th Grade    Tobacco Use/Exposure:  She used to smoke socially during the weekends but quit 40 years ago.    Alcohol Use:  Occasional  Drug Use:  None   Diet:  Regular   Exercise:  None   Hobbies:  Reading and playing volleyball             Social Determinants of Health   Financial Resource Strain: Low Risk  (10/01/2021)   Overall Financial Resource Strain (CARDIA)    Difficulty of Paying Living Expenses: Not hard at all  Food Insecurity: No Food Insecurity (06/12/2023)   Hunger Vital Sign    Worried About Running Out of Food in the Last Year: Never true    Ran Out of Food in the Last Year: Never true  Transportation Needs: No Transportation Needs (06/12/2023)   PRAPARE - Administrator, Civil Service (Medical): No    Lack of Transportation (Non-Medical): No  Physical Activity: Inactive  (10/01/2021)   Exercise Vital Sign    Days of Exercise per Week: 0 days    Minutes of Exercise per Session: 0 min  Stress: No Stress Concern Present (10/01/2021)   [employer] of Occupational Health - Occupational Stress Questionnaire    Feeling of Stress : Not at all  Social Connections: Moderately Integrated (10/01/2021)   Social Connection and Isolation Panel [NHANES]    Frequency of Communication with Friends and Family: More than three times a week    Frequency of Social Gatherings with Friends and Family: More than three times a week    Attends Religious Services: More than 4 times per year    Active Member of [employer] or Organizations: No    Attends Banker Meetings: Never    Marital Status: Married  Catering manager Violence: Not At Risk (06/12/2023)   Humiliation, Afraid, Rape, and Kick questionnaire    Fear of Current or Ex-Partner: No    Emotionally Abused: No    Physically Abused: No    Sexually Abused: No    Past Surgical History:  Procedure Laterality Date   ABDOMINAL HYSTERECTOMY     ABDOMINAL SURGERY     BREAST BIOPSY Left    2016 benign    BREAST BIOPSY Left 02/16/2023   MM LT BREAST BX W LOC DEV 1ST LESION IMAGE BX SPEC STEREO GUIDE 02/16/2023 [facility]   LEFT HEART CATH AND CORONARY ANGIOGRAPHY N/A 11/21/2021   Procedure: LEFT HEART CATH AND CORONARY ANGIOGRAPHY;  Surgeon: Iran Ouch, MD;  Location: [facility] INVASIVE CV LAB;  Service: Cardiovascular;  Laterality: N/A;   MASTECTOMY     right side   REPLACEMENT TOTAL KNEE Bilateral    TUBAL LIGATION      Family History  Problem Relation Age of Onset   Diabetes Mother    Alzheimer's disease Mother    Hypertension Mother    Hypertension Father    Heart disease Father    Hyperlipidemia Father    Kidney disease Father    Diabetes Sister    Cancer Brother        lung   Alzheimer's disease Brother    Heart disease Brother    Hypertension Brother    Diabetes Brother     Hypertension Brother    Diabetes Brother    Hyperlipidemia Son    Hypertension Son    Diabetes Son    Hypertension Daughter    Hyperlipidemia Daughter     Allergies  Allergen Reactions   Penicillins Swelling   Sulfa Antibiotics Swelling   Amlodipine Swelling    LE Swelling   Lisinopril Cough    Current Outpatient Medications on File Prior to  Visit  Medication Sig Dispense Refill   acetaminophen (TYLENOL) 650 MG CR tablet Take 650 mg by mouth 2 (two) times daily.     albuterol (VENTOLIN HFA) 108 (90 Base) MCG/ACT inhaler INHALE 2 PUFFS BY MOUTH EVERY 6 HOURS AS NEEDED (Patient taking differently: Inhale 2 puffs into the lungs every 6 (six) hours as needed for wheezing or shortness of breath.) 8.5 each 2   Alcohol Swabs (ALCOHOL PREP PAD) 70 % PADS Use to check blood sugars (Patient taking differently: 1 each by Other route See admin instructions. Use to check blood sugars) 100 each 3   ascorbic acid (VITAMIN C) 1000 MG tablet Take 1,000 mg by mouth daily.     aspirin EC 81 MG tablet Take 1 tablet (81 mg total) by mouth daily. Swallow whole. 90 tablet 3   benzonatate (TESSALON) 100 MG capsule Take 1 capsule (100 mg total) by mouth 3 (three) times daily as needed for cough. 30 capsule 0   blood glucose meter kit and supplies KIT Dispense based on patient and insurance preference. Use up to four times daily as directed. (FOR ICD-9 250.00, 250.01). (Patient taking differently: Inject 1 each into the skin as directed. Dispense based on patient and insurance preference. Use up to four times daily as directed. (FOR ICD-9 250.00, 250.01).) 1 each 11   brimonidine (ALPHAGAN) 0.2 % ophthalmic solution Place 1 drop into both eyes 3 (three) times daily.     carvedilol (COREG) 12.5 MG tablet Take 1 tablet (12.5 mg total) by mouth 2 (two) times daily with a meal. 180 tablet 3   Cholecalciferol (VITAMIN D-3 PO) Take 10,000 Units by mouth daily.     clindamycin (CLEOCIN) 150 MG capsule Take 1 capsule  (150 mg total) by mouth 3 (three) times daily. 21 capsule 0   Cyanocobalamin (B-12 PO) Take 1 tablet by mouth daily.     Dulaglutide (TRULICITY) 1.5 MG/0.5ML SOPN Inject 1.5 mg into the skin once a week. 2 mL 3   ezetimibe (ZETIA) 10 MG tablet Take 1 tablet (10 mg total) by mouth daily. 90 tablet 3   fluticasone (FLONASE) 50 MCG/ACT nasal spray Place 2 sprays into both nostrils daily. 16 g 1   gabapentin (NEURONTIN) 300 MG capsule Take 1 capsule (300 mg total) by mouth 3 (three) times daily. 270 capsule 1   glipiZIDE (GLUCOTROL) 5 MG tablet Take 1 tablet (5 mg total) by mouth 2 (two) times daily before a meal. 180 tablet 1   ketorolac (ACULAR) 0.5 % ophthalmic solution INSTILL 1 DROP INTO RIGHT EYE 4 TIMES A DAY (Patient taking differently: Place 1 drop into the right eye 4 (four) times daily. INSTILL 1 DROP INTO RIGHT EYE 3 TIMES A DAY) 5 mL 6   latanoprost (XALATAN) 0.005 % ophthalmic solution Place 1 drop into both eyes daily.     levocetirizine (XYZAL) 5 MG tablet TAKE 1 TABLET BY MOUTH EVERY DAY IN THE EVENING (Patient taking differently: Take 5 mg by mouth every evening.) 90 tablet 1   losartan (COZAAR) 50 MG tablet TAKE 1 TABLET BY MOUTH EVERY DAY 90 tablet 3   omeprazole (PRILOSEC) 40 MG capsule Take 1 capsule (40 mg total) by mouth 2 (two) times daily. Please call [phone] to schedule an office visit for more refills. 180 capsule 0   OneTouch Delica Lancets 33G MISC Use as instructed to check blood sugar 2-3 times a day.  DX E11.9 (Patient taking differently: 1 each by Other route See admin instructions.  Use as instructed to check blood sugar 2-3 times a day.  DX E11.9) 100 each 3   ONETOUCH ULTRA TEST test strip CHECK BLOOD SUGAR 2 TO 3 TIMES DAILY AS DIRECTED (Patient taking differently: 1 each by Other route as needed for other.) 100 strip 5   timolol (TIMOPTIC) 0.5 % ophthalmic solution Place 1 drop into both eyes 2 (two) times a day.     torsemide (DEMADEX) 10 MG tablet Take 1 tablet  (10 mg total) by mouth daily. 30 tablet 0   No current facility-administered medications on file prior to visit.    BP 128/80   Pulse 95   Resp 18   Ht 5\' 1"  (1.549 m)   Wt 184 lb (83.5 kg)   SpO2 100%   BMI 34.77 kg/m        Objective:   Physical Exam  General Mental Status- Alert. General Appearance- Not in acute distress.    SKIN: rt side Perineal area(near labia) with small, closed wound, not tender, no drainage, indurated area adjacent to wound.  Neck Carotid Arteries- Normal color. Moisture- Normal Moisture. No carotid bruits. No JVD.  Chest and Lung Exam Auscultation: Breath Sounds:-Normal.  Cardiovascular Auscultation:Rythm- Regular. Murmurs & Other Heart Sounds:Auscultation of the heart reveals- No Murmurs.   Neurologic Cranial Nerve exam:- CN III-XII intact(No nystagmus), symmetric smile. Strength:- 5/5 equal and symmetric strength both upper and lower extremities.       Assessment & Plan:  Assessment and Plan    Perineal skin infection. concern that abscess might form but not formed presently. Culture positive for Proteus mirabilis, sensitive to multiple antibiotics. Patient has history of allergies to penicillin and sulfa drugs. The abscess has drained and the wound has closed. There is a residual indurated area. -Continue Clindamycin for an additional 5 days. Can apply warm compress to slight indurated area -Refer to gynecology for evaluation and possible drainage procedure. Preferably Dr. Leighton Ruff if available. but ok'd Dr. Adrian Blackwater  Drug Interaction Potential interaction between Ciprofloxacin and Glipizide, and caution with Ciprofloxacin due to patient's kidney function. -Avoided prescribing Ciprofloxacin due to potential drug interaction and kidney function.   Follow up date to be determined. Recommended try to talk with gyn office today. point out my referral already placed.       Esperanza Richters, PA-C

## 2023-10-06 NOTE — Progress Notes (Signed)
History:  82 y.o. G3P3003 here today for eval of vulvar abscess. She was seen by her primary care provider who is worried that its not healing and has a lump assoc with it, they referred her for eval. She reports that the lesion initially drained a foul smelling fluid that has resovled. She denies pain. No fever or chills.    The following portions of the patient's history were reviewed and updated as appropriate: allergies, current medications, past family history, past medical history, past social history, past surgical history and problem list.  Review of Systems:  Pertinent items are noted in HPI.    Objective:  Physical Exam Blood pressure 128/80, pulse 95, height 5\' 1"  (1.549 m), weight 184 lb (83.5 kg).  CONSTITUTIONAL: Well-developed, well-nourished female in no acute distress.  HENT:  Normocephalic, atraumatic EYES: Conjunctivae and EOM are normal. No scleral icterus.  NECK: Normal range of motion SKIN: Skin is warm and dry. No rash noted. Not diaphoretic.No pallor. NEUROLGIC: Alert and oriented to person, place, and time. Normal coordination.  Abd: Soft, nontender and nondistended.  Pelvic: Normal appearing external genitalia except for lesion on the right side of the vulva. There is a prior abscess site that still has some induration. normal appearing vaginal mucosa and cervix.  Normal discharge.  There is no inguinal LA.     Assessment & Plan:  Vulvar abscess- improving.  Keep Clinda.x1 week F/u in 1 week for reeval.  F/u sooner if area become worse    Darely Becknell L. Harraway-Smith, M.D., [place] [place]

## 2023-10-13 ENCOUNTER — Ambulatory Visit: Payer: PPO | Admitting: Obstetrics & Gynecology

## 2023-10-13 ENCOUNTER — Encounter: Payer: Self-pay | Admitting: Obstetrics & Gynecology

## 2023-10-13 VITALS — BP 117/51 | HR 68 | Ht 61.0 in | Wt 184.0 lb

## 2023-10-13 DIAGNOSIS — N9089 Other specified noninflammatory disorders of vulva and perineum: Secondary | ICD-10-CM | POA: Diagnosis not present

## 2023-10-13 NOTE — Progress Notes (Signed)
History:  82 y.o. G3P3003 here today for f/u of non-healing lesion that was seen last week. She reports that she completed the atbx and feels completely fine.   The following portions of the patient's history were reviewed and updated as appropriate: allergies, current medications, past family history, past medical history, past social history, past surgical history and problem list.  Review of Systems:  Pertinent items are noted in HPI.    Objective:  Physical Exam Blood pressure (!) 117/51, pulse 68, height 5\' 1"  (1.549 m), weight 184 lb (83.5 kg).  CONSTITUTIONAL: Well-developed, well-nourished female in no acute distress.  HENT:  Normocephalic, atraumatic EYES: Conjunctivae and EOM are normal. No scleral icterus.  NECK: Normal range of motion SKIN: Skin is warm and dry. No rash noted. Not diaphoretic.No pallor. NEUROLGIC: Alert and oriented to person, place, and time. Normal coordination.   Pelvic: Normal appearing external genitalia; lesion on vulva completely healed. No induration or swelling. Opening completely closed!  Assessment & Plan:  Vuvlar lesion resolved.  F/u prn   Freeland Pracht L. Harraway-Smith, M.D., [place]

## 2023-10-15 ENCOUNTER — Other Ambulatory Visit: Payer: Self-pay | Admitting: Medical

## 2023-10-17 ENCOUNTER — Other Ambulatory Visit: Payer: Self-pay | Admitting: Internal Medicine

## 2023-10-18 ENCOUNTER — Telehealth: Payer: Self-pay | Admitting: Medical

## 2023-10-18 NOTE — Telephone Encounter (Signed)
Donna Fuller, Physical therapist with [facility] called to let PCP know that the pt has gained 6 lbs. She mentioned that her vitals are stable, no SOB, and no swelling. She stated that they will check her weight tmr and asked if PCP could call the patient to discuss her weight. Please call and advise.

## 2023-10-19 NOTE — Telephone Encounter (Signed)
Pt called and lvm to return call 

## 2023-10-20 DIAGNOSIS — I129 Hypertensive chronic kidney disease with stage 1 through stage 4 chronic kidney disease, or unspecified chronic kidney disease: Secondary | ICD-10-CM | POA: Diagnosis not present

## 2023-10-20 DIAGNOSIS — D631 Anemia in chronic kidney disease: Secondary | ICD-10-CM | POA: Diagnosis not present

## 2023-10-20 DIAGNOSIS — E1122 Type 2 diabetes mellitus with diabetic chronic kidney disease: Secondary | ICD-10-CM | POA: Diagnosis not present

## 2023-10-20 DIAGNOSIS — N1832 Chronic kidney disease, stage 3b: Secondary | ICD-10-CM | POA: Diagnosis not present

## 2023-10-20 DIAGNOSIS — E785 Hyperlipidemia, unspecified: Secondary | ICD-10-CM | POA: Diagnosis not present

## 2023-10-20 DIAGNOSIS — R809 Proteinuria, unspecified: Secondary | ICD-10-CM | POA: Diagnosis not present

## 2023-10-20 NOTE — Telephone Encounter (Signed)
Pt called appt made for Monday, pt stated she is going to see nephrology today and was going to mention to him the weight gain even though she stated her weight has came back down. Told her to call us back with the recommendations from nephrology

## 2023-10-21 ENCOUNTER — Ambulatory Visit: Payer: PPO | Admitting: Internal Medicine

## 2023-10-21 ENCOUNTER — Telehealth: Payer: Self-pay | Admitting: Medical

## 2023-10-21 ENCOUNTER — Encounter: Payer: Self-pay | Admitting: Internal Medicine

## 2023-10-21 VITALS — BP 122/80 | HR 78 | Ht 61.0 in | Wt 193.4 lb

## 2023-10-21 DIAGNOSIS — E1122 Type 2 diabetes mellitus with diabetic chronic kidney disease: Secondary | ICD-10-CM | POA: Diagnosis not present

## 2023-10-21 DIAGNOSIS — N1832 Chronic kidney disease, stage 3b: Secondary | ICD-10-CM

## 2023-10-21 DIAGNOSIS — E1142 Type 2 diabetes mellitus with diabetic polyneuropathy: Secondary | ICD-10-CM | POA: Diagnosis not present

## 2023-10-21 LAB — POCT GLYCOSYLATED HEMOGLOBIN (HGB A1C): Hemoglobin A1C: 6.8 % — AB (ref 4.0–5.6)

## 2023-10-21 MED ORDER — GLIPIZIDE 5 MG PO TABS: 5.0000 mg | ORAL_TABLET | Freq: Two times a day (BID) | ORAL | 3 refills | Status: DC

## 2023-10-21 NOTE — Telephone Encounter (Signed)
Fuller, Stephanie8 minutes ago (8:24 AM)   SP Pt called to advise PCP that her kidney doctor said he would see her again in 3 months to recheck her levels. She wanted this to be passed on to PCP

## 2023-10-21 NOTE — Telephone Encounter (Signed)
Pt has an appt on 10/25/23

## 2023-10-21 NOTE — Progress Notes (Signed)
Name: Donna Fuller  Age/ Sex: 82 y.o., female   MRN/ DOB: [id number], 1941/05/26     PCP: Marisue Brooklyn   Reason for Endocrinology Evaluation: Type 2 Diabetes Mellitus  Initial Endocrine Consultative Visit: 12/14/2019    PATIENT IDENTIFIER: [name] is a 82 y.o. female with a past medical history of T2DM, HTN, RA and Dyslipidemia. The patient has followed with Endocrinology clinic since 12/14/2019 for consultative assistance with management of her diabetes.  DIABETIC HISTORY:  Donna Fuller was diagnosed with T2DM many years ago. She was on metformin due to low renal function, has been on Januvia, Farxia and insulin as well. Her hemoglobin A1c has ranged from 6.0 %in 2015, peaking at 8.8% in 2020.  On her initial visit to our clinic her A1c was 8.8%   , she was on Trulicity and pioglitazone, we added glipizide   Stopped Pioglitazone 03/2022 due to A1c 5.7 %   SUBJECTIVE:   During the last visit (10/19/2022): A1c 6.7%    Today (10/21/2023): Donna Fuller is here for follow-up on diabetes management. She checks her blood sugars 1 times daily.  The patient has not had hypoglycemic episodes since the last clinic visit.    She follows with Dr. Titus Dubin for for seropositive rheumatoid arthritis  She also follows with nephrology for CKD with proteinuria  She also follows with cardiology for CHF  NO nausea or  vomiting  Denies diarrhea but has chronic constipation - takes stool softeners     HOME DIABETES REGIMEN:  Glipizide 5 mg , BID Trulicity 1.5 mg weekly    METER DOWNLOAD SUMMARY:  124-230  mg/dL     DIABETIC COMPLICATIONS: Microvascular complications:  Neuropathy  Denies: retinopathy , CKD Last eye exam: Completed 03/30/2023 Macrovascular complications:    Denies: CAD, PVD, CVA     HISTORY:  Past Medical History:  Past Medical History:  Diagnosis Date   Arthritis    Breast cancer (HCC) 2007   Right breast, s/p mastectomy   Chronic  diastolic congestive heart failure (HCC)    per patient, dx by cardiology   Diabetes mellitus without complication (HCC)    GERD (gastroesophageal reflux disease)    Gout    Hyperlipidemia    Hypertension    Spinal stenosis    Past Surgical History:  Past Surgical History:  Procedure Laterality Date   ABDOMINAL HYSTERECTOMY     ABDOMINAL SURGERY     BREAST BIOPSY Left    2016 benign    BREAST BIOPSY Left 02/16/2023   MM LT BREAST BX W LOC DEV 1ST LESION IMAGE BX SPEC STEREO GUIDE 02/16/2023 [facility]   LEFT HEART CATH AND CORONARY ANGIOGRAPHY N/A 11/21/2021   Procedure: LEFT HEART CATH AND CORONARY ANGIOGRAPHY;  Surgeon: Iran Ouch, MD;  Location: [facility] INVASIVE CV LAB;  Service: Cardiovascular;  Laterality: N/A;   MASTECTOMY     right side   REPLACEMENT TOTAL KNEE Bilateral    TUBAL LIGATION     Social History:  reports that she has quit smoking. Her smoking use included cigarettes. She has never been exposed to tobacco smoke. She has never used smokeless tobacco. She reports that she does not drink alcohol and does not use drugs. Family History:  Family History  Problem Relation Age of Onset   Diabetes Mother    Alzheimer's disease Mother    Hypertension Mother    Hypertension Father    Heart disease Father    Hyperlipidemia Father  Kidney disease Father    Diabetes Sister    Cancer Brother        lung   Alzheimer's disease Brother    Heart disease Brother    Hypertension Brother    Diabetes Brother    Hypertension Brother    Diabetes Brother    Hyperlipidemia Son    Hypertension Son    Diabetes Son    Hypertension Daughter    Hyperlipidemia Daughter      HOME MEDICATIONS: Allergies as of 10/21/2023       Reactions   Penicillins Swelling   Sulfa Antibiotics Swelling   Amlodipine Swelling   LE Swelling   Lisinopril Cough        Medication List        Accurate as of October 21, 2023 11:09 AM. If you have any questions, ask your  nurse or doctor.          acetaminophen 650 MG CR tablet Commonly known as: TYLENOL Take 650 mg by mouth 2 (two) times daily.   albuterol 108 (90 Base) MCG/ACT inhaler Commonly known as: VENTOLIN HFA INHALE 2 PUFFS BY MOUTH EVERY 6 HOURS AS NEEDED What changed: reasons to take this   Alcohol Prep Pad 70 % Pads Use to check blood sugars What changed:  how much to take how to take this when to take this   ascorbic acid 1000 MG tablet Commonly known as: VITAMIN C Take 1,000 mg by mouth daily.   aspirin EC 81 MG tablet Take 1 tablet (81 mg total) by mouth daily. Swallow whole.   B-12 PO Take 1 tablet by mouth daily.   benzonatate 100 MG capsule Commonly known as: TESSALON Take 1 capsule (100 mg total) by mouth 3 (three) times daily as needed for cough.   blood glucose meter kit and supplies Kit Dispense based on patient and insurance preference. Use up to four times daily as directed. (FOR ICD-9 250.00, 250.01). What changed:  how much to take how to take this when to take this   brimonidine 0.2 % ophthalmic solution Commonly known as: ALPHAGAN Place 1 drop into both eyes 3 (three) times daily.   carvedilol 12.5 MG tablet Commonly known as: COREG Take 1 tablet (12.5 mg total) by mouth 2 (two) times daily with a meal.   clindamycin 150 MG capsule Commonly known as: CLEOCIN Take 1 capsule (150 mg total) by mouth 3 (three) times daily.   ezetimibe 10 MG tablet Commonly known as: ZETIA Take 1 tablet (10 mg total) by mouth daily.   fluticasone 50 MCG/ACT nasal spray Commonly known as: FLONASE Place 2 sprays into both nostrils daily.   Fluzone High-Dose 0.5 ML injection Generic drug: Influenza vac split quadrivalent PF   gabapentin 300 MG capsule Commonly known as: NEURONTIN Take 1 capsule (300 mg total) by mouth 3 (three) times daily.   glipiZIDE 5 MG tablet Commonly known as: GLUCOTROL Take 1 tablet (5 mg total) by mouth 2 (two) times daily before a  meal.   ketorolac 0.5 % ophthalmic solution Commonly known as: ACULAR INSTILL 1 DROP INTO RIGHT EYE 4 TIMES A DAY What changed:  how much to take how to take this when to take this additional instructions   latanoprost 0.005 % ophthalmic solution Commonly known as: XALATAN Place 1 drop into both eyes daily.   levocetirizine 5 MG tablet Commonly known as: XYZAL TAKE 1 TABLET BY MOUTH EVERY DAY IN THE EVENING What changed:  how much to take  how to take this   losartan 50 MG tablet Commonly known as: COZAAR TAKE 1 TABLET BY MOUTH EVERY DAY   omeprazole 40 MG capsule Commonly known as: PRILOSEC Take 1 capsule (40 mg total) by mouth 2 (two) times daily. Please call [phone] to schedule an office visit for more refills.   OneTouch Delica Lancets 33G Misc Use as instructed to check blood sugar 2-3 times a day.  DX E11.9 What changed:  how much to take how to take this when to take this   OneTouch Ultra Test test strip Generic drug: glucose blood CHECK BLOOD SUGAR 2 TO 3 TIMES DAILY AS DIRECTED What changed: See the new instructions.   timolol 0.5 % ophthalmic solution Commonly known as: TIMOPTIC Place 1 drop into both eyes 2 (two) times a day.   torsemide 10 MG tablet Commonly known as: DEMADEX TAKE 1 TABLET BY MOUTH EVERY DAY   Trulicity 1.5 MG/0.5ML Soaj Generic drug: Dulaglutide INJECT 1.5 MG (0.5ML) UNDER THE SKIN ONCE A WEEK   VITAMIN D-3 PO Take 10,000 Units by mouth daily.         OBJECTIVE:   Vital Signs: BP 122/80 (BP Location: Left Arm, Patient Position: Sitting, Cuff Size: Large)   Pulse 78   Ht 5\' 1"  (1.549 m)   Wt 193 lb 6.4 oz (87.7 kg)   SpO2 98%   BMI 36.54 kg/m   Wt Readings from Last 3 Encounters:  10/21/23 193 lb 6.4 oz (87.7 kg)  10/13/23 184 lb (83.5 kg)  10/06/23 184 lb (83.5 kg)     Exam: General: Pt appears well and is in NAD  Lungs: Clear with good BS bilat   Heart: RRR   Extremities: No  pretibial edema.  Neuro:  MS is good with appropriate affect, pt is alert and Ox3    DM foot exam:   10/21/2023  The skin of the feet is intact without sores or ulcerations. The pedal pulses are 1+ on right and 1+ on left. The sensation is intact to a screening 5.07, 10 gram monofilament bilaterally    DATA REVIEWED:  Lab Results  Component Value Date   HGBA1C 6.8 (A) 10/21/2023   HGBA1C 6.7 (A) 04/20/2023   HGBA1C 6.8 (A) 10/19/2022   Lab Results  Component Value Date   MICROALBUR 0.4 04/21/2017   LDLCALC 55 08/06/2023   CREATININE 1.49 (H) 09/14/2023    Latest Reference Range & Units 09/14/23 14:55  Sodium 135 - 145 mEq/L 140  Potassium 3.5 - 5.1 mEq/L 4.5  Chloride 96 - 112 mEq/L 102  CO2 19 - 32 mEq/L 31  Glucose 70 - 99 mg/dL 474 (H)  BUN 6 - 23 mg/dL 29 (H)  Creatinine 2.59 - 1.20 mg/dL 5.63 (H)  Calcium 8.4 - 10.5 mg/dL 9.5  Alkaline Phosphatase 39 - 117 U/L 123 (H)  Albumin 3.5 - 5.2 g/dL 4.0  AST 0 - 37 U/L 14  ALT 0 - 35 U/L 9  Total Protein 6.0 - 8.3 g/dL 6.9  Total Bilirubin 0.2 - 1.2 mg/dL 0.8  GFR >87.56 mL/min 32.69 (L)   ASSESSMENT / PLAN / RECOMMENDATIONS:   1) Type 2 Diabetes Mellitus, optimally controlled, With neuropathic and CKD III complications - Most recent A1c of 6.8 %. Goal A1c <7.5%.   - A1c at goal  - Stopped  Pioglitazone due to LE edema/CHF  -Intolerant to higher doses of Trulicity due to abdominal pain - Intolerant to [place]  -She was on Trulicity through  patient assistance program, she was provided with a new application  MEDICATIONS: -Continue glipizide 5 mg, BID -Continue Trulicity 1.5 mg weekly  EDUCATION / INSTRUCTIONS: BG monitoring instructions: Patient is instructed to check her blood sugars 2 times a day, fasting and suppertime. Call [facility] if: BG persistently < 70  I reviewed the Rule of 15 for the treatment of hypoglycemia in detail with the patient. Literature supplied.    2) Diabetic complications:  Eye: Does not  have known diabetic retinopathy.  Neuro/ Feet: Does  have known diabetic peripheral neuropathy. Renal: Patient does not have known baseline CKD. She is  on an ACEI/ARB at present.  F/U in 6 months   Signed electronically by: Lyndle Herrlich, MD  [facility] End[facility]  [facility] [address] [place] [place], [place] [address] Phone: [phone] FAX: [phone]   CC: Marisue Brooklyn [address] [place] [address] Phone: [phone]  Fax: [phone]  Return to Endocrinology clinic as below: Future Appointments  Date Time Provider Department Center  10/21/2023 11:10 AM Evon Lopezperez, Konrad Dolores, MD [facility] None  10/25/2023  1:20 PM Marisue Brooklyn [facility]  02/08/2024 11:20 AM Pollyann Savoy, MD [facility] None

## 2023-10-21 NOTE — Telephone Encounter (Signed)
Pcp notified via other telephone note , pt also has follow up 10/25/23

## 2023-10-21 NOTE — Patient Instructions (Addendum)
-   Continue   trulicity 1.5 mg weekly  - Continue  Glipizide 5 mg , 1 tablet before Breakfast, and 1 tablet before supper     - HOW TO TREAT LOW BLOOD SUGARS (Blood sugar LESS THAN 70 MG/DL) Please follow the RULE OF 15 for the treatment of hypoglycemia treatment (when your (blood sugars are less than 70 mg/dL)   STEP 1: Take 15 grams of carbohydrates when your blood sugar is low, which includes:  3-4 GLUCOSE TABS  OR 3-4 OZ OF JUICE OR REGULAR SODA OR ONE TUBE OF GLUCOSE GEL    STEP 2: RECHECK blood sugar in 15 MINUTES STEP 3: If your blood sugar is still low at the 15 minute recheck --> then, go back to STEP 1 and treat AGAIN with another 15 grams of carbohydrates.

## 2023-10-21 NOTE — Telephone Encounter (Signed)
Pt called to advise PCP that her kidney doctor said he would see her again in 3 months to recheck her levels. She wanted this to be passed on to PCP

## 2023-10-24 ENCOUNTER — Other Ambulatory Visit: Payer: Self-pay | Admitting: Medical

## 2023-10-25 ENCOUNTER — Ambulatory Visit (HOSPITAL_BASED_OUTPATIENT_CLINIC_OR_DEPARTMENT_OTHER)
Admission: RE | Admit: 2023-10-25 | Discharge: 2023-10-25 | Disposition: A | Discharge status: Home / Self Care | Payer: PPO | Source: Ambulatory Visit | Attending: Medical | Admitting: Medical

## 2023-10-25 ENCOUNTER — Encounter: Payer: Self-pay | Admitting: Medical

## 2023-10-25 ENCOUNTER — Ambulatory Visit (INDEPENDENT_AMBULATORY_CARE_PROVIDER_SITE_OTHER): Payer: PPO | Admitting: Medical

## 2023-10-25 VITALS — BP 108/66 | HR 70 | Resp 18 | Ht 61.0 in | Wt 192.0 lb

## 2023-10-25 DIAGNOSIS — J811 Chronic pulmonary edema: Secondary | ICD-10-CM | POA: Diagnosis not present

## 2023-10-25 DIAGNOSIS — N183 Chronic kidney disease, stage 3 unspecified: Secondary | ICD-10-CM | POA: Diagnosis not present

## 2023-10-25 DIAGNOSIS — I5032 Chronic diastolic (congestive) heart failure: Secondary | ICD-10-CM | POA: Diagnosis not present

## 2023-10-25 DIAGNOSIS — R0989 Other specified symptoms and signs involving the circulatory and respiratory systems: Secondary | ICD-10-CM | POA: Diagnosis not present

## 2023-10-25 DIAGNOSIS — M79604 Pain in right leg: Secondary | ICD-10-CM | POA: Diagnosis not present

## 2023-10-25 DIAGNOSIS — R600 Localized edema: Secondary | ICD-10-CM | POA: Diagnosis not present

## 2023-10-25 DIAGNOSIS — R0609 Other forms of dyspnea: Secondary | ICD-10-CM

## 2023-10-25 DIAGNOSIS — M79609 Pain in unspecified limb: Secondary | ICD-10-CM | POA: Diagnosis not present

## 2023-10-25 DIAGNOSIS — I517 Cardiomegaly: Secondary | ICD-10-CM | POA: Diagnosis not present

## 2023-10-25 LAB — COMPREHENSIVE METABOLIC PANEL
ALT: 10 U/L (ref 0–35)
AST: 14 U/L (ref 0–37)
Albumin: 4.1 g/dL (ref 3.5–5.2)
Alkaline Phosphatase: 142 U/L — ABNORMAL HIGH (ref 39–117)
BUN: 42 mg/dL — ABNORMAL HIGH (ref 6–23)
CO2: 29 meq/L (ref 19–32)
Calcium: 9.5 mg/dL (ref 8.4–10.5)
Chloride: 102 meq/L (ref 96–112)
Creatinine, Ser: 1.93 mg/dL — ABNORMAL HIGH (ref 0.40–1.20)
GFR: 23.95 mL/min — ABNORMAL LOW (ref >60.00)
Glucose, Bld: 143 mg/dL — ABNORMAL HIGH (ref 70–99)
Potassium: 4.5 meq/L (ref 3.5–5.1)
Sodium: 141 meq/L (ref 135–145)
Total Bilirubin: 0.8 mg/dL (ref 0.2–1.2)
Total Protein: 7 g/dL (ref 6.0–8.3)

## 2023-10-25 LAB — BRAIN NATRIURETIC PEPTIDE: Pro B Natriuretic peptide (BNP): 779 pg/mL — ABNORMAL HIGH (ref 0.0–100.0)

## 2023-10-25 NOTE — Progress Notes (Signed)
Subjective:    Patient ID: Donna Fuller, female    DOB: 06-16-1941, 82 y.o.   MRN: [id number]  HPI Discussed the use of AI scribe software for clinical note transcription with the patient, who gave verbal consent to proceed.  History of Present Illness   The patient, with a history of chronic diastolic congestive heart failure, stage three kidney disease, and diabetes, presents with increased shortness of breath, weight gain, and leg swelling. Over the past three weeks, she has gained approximately six pounds. She reports feeling short of breath when lying flat and during short walks within her home. She also notes a need to take deep breaths frequently, as if her breath is "tightening up."  No chest pain.  The patient also reports pain in the calf of her right leg. She denies any significant swelling on the day of the physical therapist's visit but acknowledges that her legs are swollen at present.  Her diabetes is managed with glipizide and Trulicity, and her most recent A1c was 6.8.   The patient is currently on Coreg and losartan for blood pressure management and heart failure, and torsemide, which she takes daily for fluid management. She reports constant thirst but is unsure about her fluid intake recommendations.          Review of Systems  Constitutional:  Negative for chills, fatigue and fever.  HENT:  Negative for congestion.   Respiratory:  Positive for shortness of breath. Negative for chest tightness and wheezing.   Cardiovascular:  Negative for chest pain and palpitations.  Gastrointestinal:  Negative for abdominal pain.  Genitourinary:  Negative for dysuria and flank pain.  Musculoskeletal:  Negative for back pain.       Rt popliteal pain.  Pedal edema.  Skin:  Negative for rash and wound.  Neurological:  Negative for dizziness, seizures, syncope and light-headedness.  Hematological:  Negative for adenopathy. Does not bruise/bleed easily.   Psychiatric/Behavioral:  Negative for behavioral problems, decreased concentration and dysphoric mood.     Past Medical History:  Diagnosis Date   Arthritis    Breast cancer [facility]) 2007   Right breast, s/p mastectomy   Chronic diastolic congestive heart failure (HCC)    per patient, dx by cardiology   Diabetes mellitus without complication (HCC)    GERD (gastroesophageal reflux disease)    Gout    Hyperlipidemia    Hypertension    Spinal stenosis      Social History   Socioeconomic History   Marital status: Married    Spouse name: Jimmy   Number of children: 3   Years of education: 12   Highest education level: Not on file  Occupational History   Not on file  Tobacco Use   Smoking status: Former    Types: Cigarettes    Passive exposure: Never   Smokeless tobacco: Never  Vaping Use   Vaping status: Never Used  Substance and Sexual Activity   Alcohol use: No   Drug use: No   Sexual activity: Not Currently  Other Topics Concern   Not on file  Social History Narrative   Marital Status:  Married Hydrologist)    Children:  Daughter(1) Son (1)    Pets:  None    Living Situation: Lives with husband and daughter.     Occupation:  Retired Contractor)    Education: 12th Grade    Tobacco Use/Exposure:  She used to smoke socially during the weekends but quit 40 years ago.  Alcohol Use:  Occasional   Drug Use:  None   Diet:  Regular   Exercise:  None   Hobbies:  Reading and playing volleyball             Social Determinants of Health   Financial Resource Strain: Low Risk  (10/01/2021)   Overall Financial Resource Strain (CARDIA)    Difficulty of Paying Living Expenses: Not hard at all  Food Insecurity: No Food Insecurity (06/12/2023)   Hunger Vital Sign    Worried About Running Out of Food in the Last Year: Never true    Ran Out of Food in the Last Year: Never true  Transportation Needs: No Transportation Needs (06/12/2023)   PRAPARE - Scientist, research (physical sciences) (Medical): No    Lack of Transportation (Non-Medical): No  Physical Activity: Inactive (10/01/2021)   Exercise Vital Sign    Days of Exercise per Week: 0 days    Minutes of Exercise per Session: 0 min  Stress: No Stress Concern Present (10/01/2021)   [employer] of Occupational Health - Occupational Stress Questionnaire    Feeling of Stress : Not at all  Social Connections: Moderately Integrated (10/01/2021)   Social Connection and Isolation Panel [NHANES]    Frequency of Communication with Friends and Family: More than three times a week    Frequency of Social Gatherings with Friends and Family: More than three times a week    Attends Religious Services: More than 4 times per year    Active Member of [employer] or Organizations: No    Attends Banker Meetings: Never    Marital Status: Married  Catering manager Violence: Not At Risk (06/12/2023)   Humiliation, Afraid, Rape, and Kick questionnaire    Fear of Current or Ex-Partner: No    Emotionally Abused: No    Physically Abused: No    Sexually Abused: No    Past Surgical History:  Procedure Laterality Date   ABDOMINAL HYSTERECTOMY     ABDOMINAL SURGERY     BREAST BIOPSY Left    2016 benign    BREAST BIOPSY Left 02/16/2023   MM LT BREAST BX W LOC DEV 1ST LESION IMAGE BX SPEC STEREO GUIDE 02/16/2023 [facility]   LEFT HEART CATH AND CORONARY ANGIOGRAPHY N/A 11/21/2021   Procedure: LEFT HEART CATH AND CORONARY ANGIOGRAPHY;  Surgeon: Iran Ouch, MD;  Location: [facility] INVASIVE CV LAB;  Service: Cardiovascular;  Laterality: N/A;   MASTECTOMY     right side   REPLACEMENT TOTAL KNEE Bilateral    TUBAL LIGATION      Family History  Problem Relation Age of Onset   Diabetes Mother    Alzheimer's disease Mother    Hypertension Mother    Hypertension Father    Heart disease Father    Hyperlipidemia Father    Kidney disease Father    Diabetes Sister    Cancer Brother        lung   Alzheimer's  disease Brother    Heart disease Brother    Hypertension Brother    Diabetes Brother    Hypertension Brother    Diabetes Brother    Hyperlipidemia Son    Hypertension Son    Diabetes Son    Hypertension Daughter    Hyperlipidemia Daughter     Allergies  Allergen Reactions   Penicillins Swelling   Sulfa Antibiotics Swelling   Amlodipine Swelling    LE Swelling   Lisinopril Cough    Current  Outpatient Medications on File Prior to Visit  Medication Sig Dispense Refill   acetaminophen (TYLENOL) 650 MG CR tablet Take 650 mg by mouth 2 (two) times daily.     albuterol (VENTOLIN HFA) 108 (90 Base) MCG/ACT inhaler INHALE 2 PUFFS BY MOUTH EVERY 6 HOURS AS NEEDED (Patient taking differently: Inhale 2 puffs into the lungs every 6 (six) hours as needed for wheezing or shortness of breath.) 8.5 each 2   Alcohol Swabs (ALCOHOL PREP PAD) 70 % PADS Use to check blood sugars (Patient taking differently: 1 each by Other route See admin instructions. Use to check blood sugars) 100 each 3   ascorbic acid (VITAMIN C) 1000 MG tablet Take 1,000 mg by mouth daily.     aspirin EC 81 MG tablet Take 1 tablet (81 mg total) by mouth daily. Swallow whole. 90 tablet 3   benzonatate (TESSALON) 100 MG capsule Take 1 capsule (100 mg total) by mouth 3 (three) times daily as needed for cough. 30 capsule 0   blood glucose meter kit and supplies KIT Dispense based on patient and insurance preference. Use up to four times daily as directed. (FOR ICD-9 250.00, 250.01). (Patient taking differently: Inject 1 each into the skin as directed. Dispense based on patient and insurance preference. Use up to four times daily as directed. (FOR ICD-9 250.00, 250.01).) 1 each 11   brimonidine (ALPHAGAN) 0.2 % ophthalmic solution Place 1 drop into both eyes 3 (three) times daily.     carvedilol (COREG) 12.5 MG tablet Take 1 tablet (12.5 mg total) by mouth 2 (two) times daily with a meal. 180 tablet 3   Cholecalciferol (VITAMIN D-3 PO)  Take 10,000 Units by mouth daily.     clindamycin (CLEOCIN) 150 MG capsule Take 1 capsule (150 mg total) by mouth 3 (three) times daily. 15 capsule 0   Cyanocobalamin (B-12 PO) Take 1 tablet by mouth daily.     Dulaglutide (TRULICITY) 1.5 MG/0.5ML SOAJ INJECT 1.5 MG (0.5ML) UNDER THE SKIN ONCE A WEEK 2 mL 2   ezetimibe (ZETIA) 10 MG tablet Take 1 tablet (10 mg total) by mouth daily. 90 tablet 3   fluticasone (FLONASE) 50 MCG/ACT nasal spray SPRAY 2 SPRAYS INTO EACH NOSTRIL EVERY DAY 48 mL 1   FLUZONE HIGH-DOSE 0.5 ML injection      gabapentin (NEURONTIN) 300 MG capsule Take 1 capsule (300 mg total) by mouth 3 (three) times daily. 270 capsule 1   glipiZIDE (GLUCOTROL) 5 MG tablet Take 1 tablet (5 mg total) by mouth 2 (two) times daily before a meal. 180 tablet 3   ketorolac (ACULAR) 0.5 % ophthalmic solution INSTILL 1 DROP INTO RIGHT EYE 4 TIMES A DAY (Patient taking differently: Place 1 drop into the right eye 4 (four) times daily. INSTILL 1 DROP INTO RIGHT EYE 3 TIMES A DAY) 5 mL 6   latanoprost (XALATAN) 0.005 % ophthalmic solution Place 1 drop into both eyes daily.     levocetirizine (XYZAL) 5 MG tablet TAKE 1 TABLET BY MOUTH EVERY DAY IN THE EVENING (Patient taking differently: Take 5 mg by mouth every evening.) 90 tablet 1   losartan (COZAAR) 50 MG tablet TAKE 1 TABLET BY MOUTH EVERY DAY 90 tablet 3   omeprazole (PRILOSEC) 40 MG capsule Take 1 capsule (40 mg total) by mouth 2 (two) times daily. Please call [phone] to schedule an office visit for more refills. 180 capsule 0   OneTouch Delica Lancets 33G MISC Use as instructed to check blood sugar  2-3 times a day.  DX E11.9 (Patient taking differently: 1 each by Other route See admin instructions. Use as instructed to check blood sugar 2-3 times a day.  DX E11.9) 100 each 3   ONETOUCH ULTRA TEST test strip CHECK BLOOD SUGAR 2 TO 3 TIMES DAILY AS DIRECTED (Patient taking differently: 1 each by Other route as needed for other.) 100 strip 5    timolol (TIMOPTIC) 0.5 % ophthalmic solution Place 1 drop into both eyes 2 (two) times a day.     torsemide (DEMADEX) 10 MG tablet TAKE 1 TABLET BY MOUTH EVERY DAY 90 tablet 1   No current facility-administered medications on file prior to visit.    BP 108/66   Pulse 70   Resp 18   Ht 5\' 1"  (1.549 m)   Wt 192 lb (87.1 kg)   SpO2 96%   BMI 36.28 kg/m        Objective:   Physical Exam  General Mental Status- Alert. General Appearance- Not in acute distress.   Skin General: Color- Normal Color. Moisture- Normal Moisture.  Neck Carotid Arteries- Normal color. Moisture- Normal Moisture. No carotid bruits. No JVD.  Chest and Lung Exam Auscultation: Breath Sounds:-Normal.  Cardiovascular Auscultation:Rythm- Regular. Murmurs & Other Heart Sounds:Auscultation of the heart reveals- No Murmurs.  Abdomen Inspection:-Inspeection Normal. Palpation/Percussion:Note:No mass. Palpation and Percussion of the abdomen reveal- Non Tender, Non Distended + BS, no rebound or guarding.    Neurologic Cranial Nerve exam:- CN III-XII intact(No nystagmus), symmetric smile. Strength:- 5/5 equal and symmetric strength both upper and lower extremities.   Lower ext- 1+ symmetric pedal edema. Negative homans signs. Rt side popliteal pain. Mild + homans signs.     Assessment & Plan:   Assessment and Plan    Chronic Diastolic Congestive Heart Failure Increased shortness of breath, weight gain of 6 pounds over 3 weeks, and 1+ symmetric edema. No acute distress. Oxygen saturation 94-97%. -Order chest X-ray, BNP, and metabolic panel to assess fluid status and kidney function. -Continue current medications including Coreg, Losartan, and Torsemide 10mg  daily. -Consider reaching out to cardiologist for potential medication adjustments based on lab results.  Stage 3 Chronic Kidney Disease No acute changes reported. -Check metabolic panel to assess current kidney function. -Continue current  medications and follow-up with nephrologist in 3 months as planned.  Type 2 Diabetes Mellitus A1c 6.8 on Glipizide 5mg  BID and Trulicity 1.5mg  weekly. -Continue current medications.  Right Calf Pain New onset pain in the right calf. -Order leg ultrasound to rule out deep vein thrombosis.  General Health Maintenance -Continue physical therapy exercises and walking regimen as tolerated. -Continue monitoring weight and report significant changes.   follow up date to be determined after lab review.   Esperanza Richters, PA-C

## 2023-10-25 NOTE — Patient Instructions (Addendum)
Chronic Diastolic Congestive Heart Failure Increased shortness of breath, weight gain of 6 pounds over 3 weeks, and 1+ symmetric edema. No acute distress. Oxygen saturation 94-97%. -Order chest X-ray, BNP, and metabolic panel to assess fluid status and kidney function. -Continue current medications including Coreg, Losartan, and Torsemide 10mg  daily. -Consider reaching out to cardiologist for potential medication adjustments based on lab results.  Stage 3 Chronic Kidney Disease No acute changes reported. -Check metabolic panel to assess current kidney function. -Continue current medications and follow-up with nephrologist in 3 months as planned.  Type 2 Diabetes Mellitus A1c 6.8 on Glipizide 5mg  BID and Trulicity 1.5mg  weekly. -Continue current medications.  Right Calf Pain New onset pain in the right calf. -Order leg ultrasound to rule out deep vein thrombosis.  General Health Maintenance -Continue physical therapy exercises and walking regimen as tolerated. -Continue monitoring weight and report significant changes.   Follow up date to be determined after lab review.

## 2023-10-26 NOTE — Addendum Note (Signed)
Addended by: Gwenevere Abbot on: 10/26/2023 01:52 PM   Modules accepted: Orders

## 2023-10-31 ENCOUNTER — Other Ambulatory Visit: Payer: Self-pay | Admitting: Physician Assistant

## 2023-11-05 ENCOUNTER — Emergency Department: Payer: PPO

## 2023-11-05 ENCOUNTER — Inpatient Hospital Stay
Admission: EM | Admit: 2023-11-05 | Discharge: 2023-11-08 | DRG: 280 | Disposition: A | Discharge status: Home / Self Care | Payer: PPO | Attending: Obstetrics and Gynecology | Admitting: Obstetrics and Gynecology

## 2023-11-05 ENCOUNTER — Telehealth: Payer: Self-pay | Admitting: Medical

## 2023-11-05 ENCOUNTER — Encounter: Payer: Self-pay | Admitting: Emergency Medicine

## 2023-11-05 DIAGNOSIS — Z66 Do not resuscitate: Secondary | ICD-10-CM | POA: Diagnosis present

## 2023-11-05 DIAGNOSIS — Z841 Family history of disorders of kidney and ureter: Secondary | ICD-10-CM

## 2023-11-05 DIAGNOSIS — Z79899 Other long term (current) drug therapy: Secondary | ICD-10-CM

## 2023-11-05 DIAGNOSIS — R079 Chest pain, unspecified: Secondary | ICD-10-CM | POA: Diagnosis not present

## 2023-11-05 DIAGNOSIS — D696 Thrombocytopenia, unspecified: Secondary | ICD-10-CM | POA: Insufficient documentation

## 2023-11-05 DIAGNOSIS — I10 Essential (primary) hypertension: Secondary | ICD-10-CM | POA: Diagnosis present

## 2023-11-05 DIAGNOSIS — I214 Non-ST elevation (NSTEMI) myocardial infarction: Principal | ICD-10-CM | POA: Diagnosis present

## 2023-11-05 DIAGNOSIS — Z83438 Family history of other disorder of lipoprotein metabolism and other lipidemia: Secondary | ICD-10-CM

## 2023-11-05 DIAGNOSIS — I700 Atherosclerosis of aorta: Secondary | ICD-10-CM | POA: Diagnosis not present

## 2023-11-05 DIAGNOSIS — Z888 Allergy status to other drugs, medicaments and biological substances status: Secondary | ICD-10-CM

## 2023-11-05 DIAGNOSIS — Z9011 Acquired absence of right breast and nipple: Secondary | ICD-10-CM

## 2023-11-05 DIAGNOSIS — I5033 Acute on chronic diastolic (congestive) heart failure: Secondary | ICD-10-CM | POA: Diagnosis present

## 2023-11-05 DIAGNOSIS — R059 Cough, unspecified: Secondary | ICD-10-CM | POA: Diagnosis not present

## 2023-11-05 DIAGNOSIS — M51369 Other intervertebral disc degeneration, lumbar region without mention of lumbar back pain or lower extremity pain: Secondary | ICD-10-CM | POA: Diagnosis present

## 2023-11-05 DIAGNOSIS — D631 Anemia in chronic kidney disease: Secondary | ICD-10-CM | POA: Diagnosis present

## 2023-11-05 DIAGNOSIS — Z7984 Long term (current) use of oral hypoglycemic drugs: Secondary | ICD-10-CM

## 2023-11-05 DIAGNOSIS — Z7985 Long-term (current) use of injectable non-insulin antidiabetic drugs: Secondary | ICD-10-CM

## 2023-11-05 DIAGNOSIS — E785 Hyperlipidemia, unspecified: Secondary | ICD-10-CM | POA: Diagnosis present

## 2023-11-05 DIAGNOSIS — N189 Chronic kidney disease, unspecified: Secondary | ICD-10-CM | POA: Diagnosis present

## 2023-11-05 DIAGNOSIS — M069 Rheumatoid arthritis, unspecified: Secondary | ICD-10-CM | POA: Insufficient documentation

## 2023-11-05 DIAGNOSIS — I252 Old myocardial infarction: Secondary | ICD-10-CM

## 2023-11-05 DIAGNOSIS — J988 Other specified respiratory disorders: Secondary | ICD-10-CM

## 2023-11-05 DIAGNOSIS — Z853 Personal history of malignant neoplasm of breast: Secondary | ICD-10-CM

## 2023-11-05 DIAGNOSIS — I2510 Atherosclerotic heart disease of native coronary artery without angina pectoris: Secondary | ICD-10-CM | POA: Diagnosis present

## 2023-11-05 DIAGNOSIS — M109 Gout, unspecified: Secondary | ICD-10-CM | POA: Diagnosis present

## 2023-11-05 DIAGNOSIS — M4800 Spinal stenosis, site unspecified: Secondary | ICD-10-CM | POA: Diagnosis present

## 2023-11-05 DIAGNOSIS — Z8249 Family history of ischemic heart disease and other diseases of the circulatory system: Secondary | ICD-10-CM

## 2023-11-05 DIAGNOSIS — Z1152 Encounter for screening for COVID-19: Secondary | ICD-10-CM

## 2023-11-05 DIAGNOSIS — E1165 Type 2 diabetes mellitus with hyperglycemia: Secondary | ICD-10-CM | POA: Diagnosis present

## 2023-11-05 DIAGNOSIS — I440 Atrioventricular block, first degree: Secondary | ICD-10-CM | POA: Diagnosis present

## 2023-11-05 DIAGNOSIS — E1122 Type 2 diabetes mellitus with diabetic chronic kidney disease: Secondary | ICD-10-CM | POA: Diagnosis present

## 2023-11-05 DIAGNOSIS — Z880 Allergy status to penicillin: Secondary | ICD-10-CM

## 2023-11-05 DIAGNOSIS — K219 Gastro-esophageal reflux disease without esophagitis: Secondary | ICD-10-CM | POA: Diagnosis present

## 2023-11-05 DIAGNOSIS — Z90710 Acquired absence of both cervix and uterus: Secondary | ICD-10-CM

## 2023-11-05 DIAGNOSIS — Z833 Family history of diabetes mellitus: Secondary | ICD-10-CM

## 2023-11-05 DIAGNOSIS — R7989 Other specified abnormal findings of blood chemistry: Secondary | ICD-10-CM | POA: Diagnosis present

## 2023-11-05 DIAGNOSIS — I2489 Other forms of acute ischemic heart disease: Secondary | ICD-10-CM | POA: Diagnosis present

## 2023-11-05 DIAGNOSIS — I5030 Unspecified diastolic (congestive) heart failure: Secondary | ICD-10-CM | POA: Diagnosis present

## 2023-11-05 DIAGNOSIS — N1832 Chronic kidney disease, stage 3b: Secondary | ICD-10-CM | POA: Diagnosis present

## 2023-11-05 DIAGNOSIS — D0511 Intraductal carcinoma in situ of right breast: Secondary | ICD-10-CM | POA: Diagnosis present

## 2023-11-05 DIAGNOSIS — Z96653 Presence of artificial knee joint, bilateral: Secondary | ICD-10-CM | POA: Diagnosis present

## 2023-11-05 DIAGNOSIS — Z6841 Body Mass Index (BMI) 40.0 and over, adult: Secondary | ICD-10-CM

## 2023-11-05 DIAGNOSIS — Z7982 Long term (current) use of aspirin: Secondary | ICD-10-CM

## 2023-11-05 DIAGNOSIS — Z87891 Personal history of nicotine dependence: Secondary | ICD-10-CM

## 2023-11-05 DIAGNOSIS — I130 Hypertensive heart and chronic kidney disease with heart failure and stage 1 through stage 4 chronic kidney disease, or unspecified chronic kidney disease: Principal | ICD-10-CM | POA: Diagnosis present

## 2023-11-05 DIAGNOSIS — Z882 Allergy status to sulfonamides status: Secondary | ICD-10-CM

## 2023-11-05 DIAGNOSIS — E66813 Obesity, class 3: Secondary | ICD-10-CM | POA: Insufficient documentation

## 2023-11-05 LAB — CBC
HCT: 38.1 % (ref 36.0–46.0)
Hemoglobin: 12.2 g/dL (ref 12.0–15.0)
MCH: 30.3 pg (ref 26.0–34.0)
MCHC: 32 g/dL (ref 30.0–36.0)
MCV: 94.5 fL (ref 80.0–100.0)
Platelets: 122 10*3/uL — ABNORMAL LOW (ref 150–400)
RBC: 4.03 MIL/uL (ref 3.87–5.11)
RDW: 16.7 % — ABNORMAL HIGH (ref 11.5–15.5)
WBC: 12.1 10*3/uL — ABNORMAL HIGH (ref 4.0–10.5)
nRBC: 0 % (ref 0.0–0.2)

## 2023-11-05 LAB — BASIC METABOLIC PANEL
Anion gap: 13 (ref 5–15)
BUN: 43 mg/dL — ABNORMAL HIGH (ref 8–23)
CO2: 25 mmol/L (ref 22–32)
Calcium: 9.4 mg/dL (ref 8.9–10.3)
Chloride: 104 mmol/L (ref 98–111)
Creatinine, Ser: 1.78 mg/dL — ABNORMAL HIGH (ref 0.44–1.00)
GFR, Estimated: 28 mL/min — ABNORMAL LOW (ref >60)
Glucose, Bld: 174 mg/dL — ABNORMAL HIGH (ref 70–99)
Potassium: 4.2 mmol/L (ref 3.5–5.1)
Sodium: 142 mmol/L (ref 135–145)

## 2023-11-05 LAB — TROPONIN I (HIGH SENSITIVITY)
Troponin I (High Sensitivity): 96 ng/L — ABNORMAL HIGH (ref <18)
Troponin I (High Sensitivity): 130 ng/L (ref <18)

## 2023-11-05 LAB — RESP PANEL BY RT-PCR (RSV, FLU A&B, COVID)  RVPGX2
Influenza A by PCR: NEGATIVE
Influenza B by PCR: NEGATIVE
Resp Syncytial Virus by PCR: NEGATIVE
SARS Coronavirus 2 by RT PCR: NEGATIVE

## 2023-11-05 MED ORDER — HEPARIN SODIUM (PORCINE) 5000 UNIT/ML IJ SOLN: 4000.0000 [IU] | Freq: Once | INTRAMUSCULAR | Status: DC

## 2023-11-05 MED ORDER — HEPARIN (PORCINE) 25000 UT/250ML-% IV SOLN
900.0000 [IU]/h | INTRAVENOUS | Status: DC
  Administered 2023-11-06: 800 [IU]/h via INTRAVENOUS
  Administered 2023-11-08: 900 [IU]/h via INTRAVENOUS
  Filled 2023-11-05 (×3): qty 250

## 2023-11-05 MED ORDER — HEPARIN BOLUS VIA INFUSION
4000.0000 [IU] | Freq: Once | INTRAVENOUS | Status: AC
  Administered 2023-11-06: 4000 [IU] via INTRAVENOUS
  Filled 2023-11-05: qty 4000

## 2023-11-05 NOTE — Telephone Encounter (Signed)
Called and advised pt again that she needed to be seen immediately as we do not have any openings here.  Advised her to at least go to an urgent care if not hospital.  Will call to follow up Monday

## 2023-11-05 NOTE — Telephone Encounter (Signed)
Pt called, stated someone was supposed to call her on Monday to make an appt but did not. She called to see if we have any openings left for today as her breathing issues continue and she is worried about going to the ED. Juanetta made aware and is checking to see if there are any slots we can use given pcp is at session limit. Also transferred pt to triage.

## 2023-11-05 NOTE — Telephone Encounter (Signed)
Initial Comment Caller states she is having SOB and is also coughing. Translation No Nurse Assessment Nurse: Andrey Campanile, RN, Marylene Land Date/Time Lamount Cohen Time): 11/05/2023 8:49:27 AM Confirm and document reason for call. If symptomatic, describe symptoms. ---Caller states she is having SOB (since yesterday)and is also coughing. Coughing up light yellow phlegm. Does the patient have any new or worsening symptoms? ---Yes Will a triage be completed? ---Yes Related visit to physician within the last 2 weeks? ---No Does the PT have any chronic conditions? (i.e. diabetes, asthma, this includes High risk factors for pregnancy, etc.) ---Yes List chronic conditions. ---Heart Failure, Diabetes, HTN Is this a behavioral health or substance abuse call? ---No Guidelines Guideline Title Affirmed Question Affirmed Notes Nurse Date/Time Lamount Cohen Time) Breathing Difficulty Slow, shallow and weak breathing Andrey Campanile, RN, Marylene Land 11/05/2023 8:53:03 AM Disp. Time Lamount Cohen Time) Disposition Final User 11/05/2023 8:44:42 AM Send to Urgent Pershing Proud 11/05/2023 8:57:27 AM Call EMS 911 Now Yes Andrey Campanile, RN, Marylene Land 11/05/2023 8:58:22 AM 911 Outcome Documentation Andrey Campanile, RN, Marylene Land PLEASE NOTE: All timestamps contained within this report are represented as [place] Standard Time. CONFIDENTIALTY NOTICE: This fax transmission is intended only for the addressee. It contains information that is legally privileged, confidential or otherwise protected from use or disclosure. If you are not the intended recipient, you are strictly prohibited from reviewing, disclosing, copying using or disseminating any of this information or taking any action in reliance on or regarding this information. If you have received this fax in error, please notify us immediately by telephone so that we can arrange for its return to [place]. Phone: [phone], Toll-Free: [phone], Fax: [phone] Page: 2 of 2 Call Id: [id number] Disp. Time  Lamount Cohen Time) Disposition Final User Reason: Refused. Final Disposition 11/05/2023 8:57:27 AM Call EMS 911 Now Yes Andrey Campanile, RN, Rosalyn Charters Disagree/Comply Disagree Caller Understands Yes PreDisposition Call Doctor Care Advice Given Per Guideline CALL EMS 911 NOW: * Immediate medical attention is needed. You need to hang up and call 911 (or an ambulance). Referrals GO TO FACILITY UNDECIDED

## 2023-11-05 NOTE — ED Provider Notes (Signed)
[facility] Provider Note    Event Date/Time   First MD Initiated Contact with Patient 11/05/23 2303     (approximate)   History   Shortness of Breath   HPI  Donna Fuller is a 82 y.o. female who presents to the ED for evaluation of Shortness of Breath   Review of cardiology clinic visit from 1 month ago. dCHF, CAD, HTN, DM, CKD.  Left heart cath 2 years ago mild nonobstructive CAD, 20% LAD stenosis  Patient presents to the ED for evaluation of malaise, dyspnea and chest discomfort.  She reports since Monday she has had malaise, congestion and a nonproductive cough.  Within the past 12 hours she had an episode of chest discomfort, but none now, and daughter reports that she seems short of breath at home.  Due to these worsening features they present to the ED.  Here, she reports "feeling like crap" in a generalized fashion without focal complaints   Physical Exam   Triage Vital Signs: ED Triage Vitals  Encounter Vitals Group     BP 11/05/23 1751 (!) 152/77     Systolic BP Percentile --      Diastolic BP Percentile --      Pulse Rate 11/05/23 1751 79     Resp 11/05/23 1751 18     Temp 11/05/23 1751 99.1 F (37.3 C)     Temp Source 11/05/23 2210 Oral     SpO2 11/05/23 1751 95 %     Weight 11/05/23 1750 192 lb (87.1 kg)     Height 11/05/23 1750 5\' 1"  (1.549 m)     Head Circumference --      Peak Flow --      Pain Score 11/05/23 1750 8     Pain Loc --      Pain Education --      Exclude from Growth Chart --     Most recent vital signs: Vitals:   11/05/23 1751 11/05/23 2210  BP: (!) 152/77 (!) 141/70  Pulse: 79 77  Resp: 18 17  Temp: 99.1 F (37.3 C) 99.8 F (37.7 C)  SpO2: 95% 95%    General: Awake, no distress.  CV:  Good peripheral perfusion.  Resp:  Normal effort.  Abd:  No distention.  MSK:  No deformity noted.  Neuro:  No focal deficits appreciated. Other:     ED Results / Procedures / Treatments   Labs (all labs  ordered are listed, but only abnormal results are displayed) Labs Reviewed  BASIC METABOLIC PANEL - Abnormal; Notable for the following components:      Result Value   Glucose, Bld 174 (*)    BUN 43 (*)    Creatinine, Ser 1.78 (*)    GFR, Estimated 28 (*)    All other components within normal limits  CBC - Abnormal; Notable for the following components:   WBC 12.1 (*)    RDW 16.7 (*)    Platelets 122 (*)    All other components within normal limits  TROPONIN I (HIGH SENSITIVITY) - Abnormal; Notable for the following components:   Troponin I (High Sensitivity) 96 (*)    All other components within normal limits  TROPONIN I (HIGH SENSITIVITY) - Abnormal; Notable for the following components:   Troponin I (High Sensitivity) 130 (*)    All other components within normal limits  RESP PANEL BY RT-PCR (RSV, FLU A&B, COVID)  [id number]    EKG Sinus rhythm with a  rate of 77 bpm.  Normal axis.  First-degree AV block.  No STEMI.  Nonspecific ST changes.  RADIOLOGY CXR interpreted by me without evidence of acute cardiopulmonary pathology.  Official radiology report(s): DG Chest 2 View  Result Date: 11/05/2023 CLINICAL DATA:  Chest pain, cough EXAM: CHEST - 2 VIEW COMPARISON:  10/25/2023 FINDINGS: Lungs are essentially clear.  No pleural effusion or pneumothorax. The heart is normal in size.  Thoracic aortic atherosclerosis. Visualized osseous structures are within normal limits. IMPRESSION: Normal chest radiographs. Electronically Signed   By: Charline Bills M.D.   On: 11/05/2023 19:06    PROCEDURES and INTERVENTIONS:  .Critical Care  Performed by: Delton Prairie, MD Authorized by: Delton Prairie, MD   Critical care provider statement:    Critical care time (minutes):  30   Critical care time was exclusive of:  Separately billable procedures and treating other patients   Critical care was necessary to treat or prevent imminent or life-threatening deterioration of the following conditions:   Cardiac failure and circulatory failure   Critical care was time spent personally by me on the following activities:  Development of treatment plan with patient or surrogate, discussions with consultants, evaluation of patient's response to treatment, examination of patient, ordering and review of laboratory studies, ordering and review of radiographic studies, ordering and performing treatments and interventions, pulse oximetry, re-evaluation of patient's condition and review of old charts .1-3 Lead EKG Interpretation  Performed by: Delton Prairie, MD Authorized by: Delton Prairie, MD     ECG rate:  74   ECG rate assessment: normal     Rhythm: sinus rhythm     Ectopy: aberrant     Conduction: normal     Medications - No data to display   IMPRESSION / MDM / ASSESSMENT AND PLAN / ED COURSE  I reviewed the triage vital signs and the nursing notes.  Differential diagnosis includes, but is not limited to, ACS, PTX, PNA, muscle strain/spasm, PE, dissection, anxiety, pleural effusion  {Patient presents with symptoms of an acute illness or injury that is potentially life-threatening.  Patient presents with evidence of requiring heparinization and medical admission.  No active chest pain.  Nonspecific ST changes on EKG, troponins are rising then we will start her on heparin.  Clear CXR and I suspect her preceding symptoms may be related to a viral syndrome and could represent bronchitis.  Consult with medicine for admission  Clinical Course as of 11/06/23 0506  Sat Nov 06, 2023  4098 I consult with medicine who agrees to admit [DS]    Clinical Course User Index [DS] Delton Prairie, MD     FINAL CLINICAL IMPRESSION(S) / ED DIAGNOSES   Final diagnoses:  None     Rx / DC Orders   ED Discharge Orders     None        Note:  This document was prepared using Dragon voice recognition software and may include unintentional dictation errors.   Delton Prairie, MD 11/06/23 [phone]

## 2023-11-05 NOTE — ED Triage Notes (Signed)
Pt here from home with c/o cough and productive cough with green sputum along with some chest pain , no fevers

## 2023-11-06 ENCOUNTER — Other Ambulatory Visit: Payer: Self-pay

## 2023-11-06 DIAGNOSIS — Z8249 Family history of ischemic heart disease and other diseases of the circulatory system: Secondary | ICD-10-CM | POA: Diagnosis not present

## 2023-11-06 DIAGNOSIS — I5033 Acute on chronic diastolic (congestive) heart failure: Secondary | ICD-10-CM

## 2023-11-06 DIAGNOSIS — Z79899 Other long term (current) drug therapy: Secondary | ICD-10-CM | POA: Diagnosis not present

## 2023-11-06 DIAGNOSIS — Z1152 Encounter for screening for COVID-19: Secondary | ICD-10-CM | POA: Diagnosis not present

## 2023-11-06 DIAGNOSIS — E66813 Obesity, class 3: Secondary | ICD-10-CM | POA: Diagnosis not present

## 2023-11-06 DIAGNOSIS — I214 Non-ST elevation (NSTEMI) myocardial infarction: Principal | ICD-10-CM | POA: Diagnosis present

## 2023-11-06 DIAGNOSIS — Z6841 Body Mass Index (BMI) 40.0 and over, adult: Secondary | ICD-10-CM | POA: Diagnosis not present

## 2023-11-06 DIAGNOSIS — Z7984 Long term (current) use of oral hypoglycemic drugs: Secondary | ICD-10-CM | POA: Diagnosis not present

## 2023-11-06 DIAGNOSIS — J988 Other specified respiratory disorders: Secondary | ICD-10-CM

## 2023-11-06 DIAGNOSIS — I252 Old myocardial infarction: Secondary | ICD-10-CM | POA: Diagnosis not present

## 2023-11-06 DIAGNOSIS — M069 Rheumatoid arthritis, unspecified: Secondary | ICD-10-CM | POA: Insufficient documentation

## 2023-11-06 DIAGNOSIS — D631 Anemia in chronic kidney disease: Secondary | ICD-10-CM | POA: Diagnosis not present

## 2023-11-06 DIAGNOSIS — I2510 Atherosclerotic heart disease of native coronary artery without angina pectoris: Secondary | ICD-10-CM | POA: Diagnosis not present

## 2023-11-06 DIAGNOSIS — E1165 Type 2 diabetes mellitus with hyperglycemia: Secondary | ICD-10-CM | POA: Diagnosis not present

## 2023-11-06 DIAGNOSIS — I700 Atherosclerosis of aorta: Secondary | ICD-10-CM | POA: Diagnosis not present

## 2023-11-06 DIAGNOSIS — M109 Gout, unspecified: Secondary | ICD-10-CM | POA: Diagnosis not present

## 2023-11-06 DIAGNOSIS — I2489 Other forms of acute ischemic heart disease: Secondary | ICD-10-CM | POA: Diagnosis not present

## 2023-11-06 DIAGNOSIS — D696 Thrombocytopenia, unspecified: Secondary | ICD-10-CM | POA: Insufficient documentation

## 2023-11-06 DIAGNOSIS — N1832 Chronic kidney disease, stage 3b: Secondary | ICD-10-CM | POA: Diagnosis not present

## 2023-11-06 DIAGNOSIS — R079 Chest pain, unspecified: Secondary | ICD-10-CM | POA: Diagnosis not present

## 2023-11-06 DIAGNOSIS — E785 Hyperlipidemia, unspecified: Secondary | ICD-10-CM | POA: Diagnosis not present

## 2023-11-06 DIAGNOSIS — R7989 Other specified abnormal findings of blood chemistry: Secondary | ICD-10-CM | POA: Diagnosis not present

## 2023-11-06 DIAGNOSIS — K219 Gastro-esophageal reflux disease without esophagitis: Secondary | ICD-10-CM | POA: Diagnosis not present

## 2023-11-06 DIAGNOSIS — I440 Atrioventricular block, first degree: Secondary | ICD-10-CM | POA: Diagnosis not present

## 2023-11-06 DIAGNOSIS — Z66 Do not resuscitate: Secondary | ICD-10-CM | POA: Diagnosis not present

## 2023-11-06 DIAGNOSIS — I130 Hypertensive heart and chronic kidney disease with heart failure and stage 1 through stage 4 chronic kidney disease, or unspecified chronic kidney disease: Secondary | ICD-10-CM | POA: Diagnosis not present

## 2023-11-06 DIAGNOSIS — E1122 Type 2 diabetes mellitus with diabetic chronic kidney disease: Secondary | ICD-10-CM | POA: Diagnosis not present

## 2023-11-06 DIAGNOSIS — R059 Cough, unspecified: Secondary | ICD-10-CM | POA: Diagnosis not present

## 2023-11-06 LAB — RESPIRATORY PANEL BY PCR

## 2023-11-06 LAB — CBG MONITORING, ED
Glucose-Capillary: 152 mg/dL — ABNORMAL HIGH (ref 70–99)
Glucose-Capillary: 219 mg/dL — ABNORMAL HIGH (ref 70–99)
Glucose-Capillary: 155 mg/dL — ABNORMAL HIGH (ref 70–99)

## 2023-11-06 LAB — PROTIME-INR
INR: 1.2 (ref 0.8–1.2)
Prothrombin Time: 15.8 s — ABNORMAL HIGH (ref 11.4–15.2)

## 2023-11-06 LAB — GLUCOSE, CAPILLARY
Glucose-Capillary: 39 mg/dL — CL (ref 70–99)
Glucose-Capillary: 94 mg/dL (ref 70–99)
Glucose-Capillary: 145 mg/dL — ABNORMAL HIGH (ref 70–99)

## 2023-11-06 LAB — BRAIN NATRIURETIC PEPTIDE: B Natriuretic Peptide: 653.6 pg/mL — ABNORMAL HIGH (ref 0.0–100.0)

## 2023-11-06 LAB — HEPARIN LEVEL (UNFRACTIONATED)
Heparin Unfractionated: 0.35 [IU]/mL (ref 0.30–0.70)
Heparin Unfractionated: 0.34 [IU]/mL (ref 0.30–0.70)

## 2023-11-06 LAB — APTT: aPTT: 33 s (ref 24–36)

## 2023-11-06 LAB — TROPONIN I (HIGH SENSITIVITY): Troponin I (High Sensitivity): 158 ng/L (ref <18)

## 2023-11-06 MED ORDER — ACETAMINOPHEN 325 MG PO TABS
650.0000 mg | ORAL_TABLET | ORAL | Status: DC | PRN
Start: 2023-11-06 — End: 2023-11-08
  Administered 2023-11-06 – 2023-11-07 (×2): 650 mg via ORAL
  Filled 2023-11-06 (×2): qty 2

## 2023-11-06 MED ORDER — INSULIN ASPART 100 UNIT/ML IJ SOLN
0.0000 [IU] | Freq: Three times a day (TID) | INTRAMUSCULAR | Status: DC
Start: 2023-11-06 — End: 2023-11-08
  Administered 2023-11-06: 3 [IU] via SUBCUTANEOUS
  Administered 2023-11-06 – 2023-11-07 (×2): 5 [IU] via SUBCUTANEOUS
  Administered 2023-11-08: 2 [IU] via SUBCUTANEOUS
  Administered 2023-11-08: 3 [IU] via SUBCUTANEOUS
  Filled 2023-11-06 (×5): qty 1

## 2023-11-06 MED ORDER — ASPIRIN 81 MG PO TBEC
81.0000 mg | DELAYED_RELEASE_TABLET | Freq: Every day | ORAL | Status: DC
  Administered 2023-11-07 – 2023-11-08 (×2): 81 mg via ORAL
  Filled 2023-11-06 (×2): qty 1

## 2023-11-06 MED ORDER — CARVEDILOL 12.5 MG PO TABS
12.5000 mg | ORAL_TABLET | Freq: Two times a day (BID) | ORAL | Status: DC
  Administered 2023-11-06 – 2023-11-08 (×5): 12.5 mg via ORAL
  Filled 2023-11-06: qty 1
  Filled 2023-11-06: qty 2
  Filled 2023-11-06 (×3): qty 1

## 2023-11-06 MED ORDER — ONDANSETRON HCL 4 MG/2ML IJ SOLN: 4.0000 mg | Freq: Four times a day (QID) | INTRAMUSCULAR | Status: DC | PRN

## 2023-11-06 MED ORDER — PANTOPRAZOLE SODIUM 40 MG PO TBEC
40.0000 mg | DELAYED_RELEASE_TABLET | Freq: Every day | ORAL | Status: DC
  Administered 2023-11-06 – 2023-11-08 (×3): 40 mg via ORAL
  Filled 2023-11-06 (×3): qty 1

## 2023-11-06 MED ORDER — EZETIMIBE 10 MG PO TABS
10.0000 mg | ORAL_TABLET | Freq: Every day | ORAL | Status: DC
  Administered 2023-11-06 – 2023-11-08 (×3): 10 mg via ORAL
  Filled 2023-11-06 (×3): qty 1

## 2023-11-06 MED ORDER — TORSEMIDE 20 MG PO TABS
10.0000 mg | ORAL_TABLET | Freq: Every day | ORAL | Status: DC
  Administered 2023-11-06: 10 mg via ORAL
  Filled 2023-11-06: qty 1

## 2023-11-06 MED ORDER — ALBUTEROL SULFATE (2.5 MG/3ML) 0.083% IN NEBU: 2.5000 mg | INHALATION_SOLUTION | Freq: Four times a day (QID) | RESPIRATORY_TRACT | Status: DC | PRN

## 2023-11-06 MED ORDER — FUROSEMIDE 10 MG/ML IJ SOLN
40.0000 mg | Freq: Once | INTRAMUSCULAR | Status: AC
  Administered 2023-11-06: 40 mg via INTRAVENOUS
  Filled 2023-11-06: qty 4

## 2023-11-06 MED ORDER — BRIMONIDINE TARTRATE 0.2 % OP SOLN
1.0000 [drp] | Freq: Three times a day (TID) | OPHTHALMIC | Status: DC
  Administered 2023-11-06 – 2023-11-08 (×7): 1 [drp] via OPHTHALMIC
  Filled 2023-11-06: qty 5

## 2023-11-06 MED ORDER — ASPIRIN 81 MG PO CHEW
324.0000 mg | CHEWABLE_TABLET | ORAL | Status: AC
  Administered 2023-11-06: 324 mg via ORAL
  Filled 2023-11-06: qty 4

## 2023-11-06 MED ORDER — INSULIN ASPART 100 UNIT/ML IJ SOLN: 0.0000 [IU] | Freq: Every day | INTRAMUSCULAR | Status: DC

## 2023-11-06 MED ORDER — GABAPENTIN 300 MG PO CAPS
300.0000 mg | ORAL_CAPSULE | Freq: Three times a day (TID) | ORAL | Status: DC
  Administered 2023-11-06 – 2023-11-08 (×7): 300 mg via ORAL
  Filled 2023-11-06 (×7): qty 1

## 2023-11-06 MED ORDER — LOSARTAN POTASSIUM 50 MG PO TABS
50.0000 mg | ORAL_TABLET | Freq: Every day | ORAL | Status: DC
  Administered 2023-11-06: 50 mg via ORAL
  Filled 2023-11-06: qty 1

## 2023-11-06 MED ORDER — NITROGLYCERIN 0.4 MG SL SUBL: 0.4000 mg | SUBLINGUAL_TABLET | SUBLINGUAL | Status: DC | PRN

## 2023-11-06 MED ORDER — LATANOPROST 0.005 % OP SOLN
1.0000 [drp] | Freq: Every day | OPHTHALMIC | Status: DC
  Administered 2023-11-06 – 2023-11-07 (×2): 1 [drp] via OPHTHALMIC
  Filled 2023-11-06: qty 2.5

## 2023-11-06 MED ORDER — GUAIFENESIN 100 MG/5ML PO LIQD
5.0000 mL | Freq: Four times a day (QID) | ORAL | Status: DC | PRN
  Administered 2023-11-06 – 2023-11-08 (×3): 5 mL via ORAL
  Filled 2023-11-06 (×3): qty 10

## 2023-11-06 MED ORDER — ASPIRIN 300 MG RE SUPP: 300.0000 mg | RECTAL | Status: AC

## 2023-11-06 NOTE — Progress Notes (Signed)
PROGRESS NOTE    Donna Fuller  UEA:[id number] DOB: May 17, 1941 DOA: 11/05/2023 PCP: Esperanza Richters, PA-C  Outpatient Specialists: cardiology, endo, nephrology    Brief Narrative:   From admission h and p Donna Fuller is a 82 y.o. female with medical history significant for DM, diastolic CHF (EF 65 to 70%, G2 DD 05/2023), CKD 3b, HTN,  nonobstructive CAD on cath 2022(20% LAD stenosis), last hospitalized from 7/12 to 06/13/2023 for CHF exacerbation and chest pain with flat troponin, who is being admitted for possible NSTEMI. Patient developed chest pain and shortness of breath on the day of arrival however this was preceded by a week of minimally productive cough and generalized malaise.  She denied fever or chills, lower extremity pain or swelling. Patient was last evaluated by her cardiologist on 10/06/2023 and was found to be stable. She is chest pain-free on arrival  Assessment & Plan:   Principal Problem:   NSTEMI (non-ST elevated myocardial infarction) [facility]) Active Problems:   CAD (coronary artery disease) 20% narrowing of LAD based on cardiac catheterization from 2022   (HFpEF) heart failure with preserved ejection fraction (HCC)   Respiratory tract infection   Hypertension   Spinal stenosis   History of breast cancer   Stage 3b chronic kidney disease (HCC)   Type 2 diabetes mellitus with hyperglycemia, without long-term current use of insulin (HCC)   DDD (degenerative disc disease), lumbar   Ductal carcinoma in situ (DCIS) of right breast   Anemia in chronic kidney disease   Thrombocytopenia (HCC)   Obesity, Class III, BMI 40-49.9 (morbid obesity) (HCC)   Rheumatoid arthritis (HCC)  # HFpEF with exacerbation Orthopnea, dyspnea, bnp elevated, cxr clear - IV diuresis dosing per cardiology - TTE ordered - home coreg  # CAD # NSTEMI Non-obstructive CAD seen on 2022 cath. Here with intermittent non-exertional chest pain, trop has risen to 158. S/p aspirin, started  on heparin - heparin per cardiology - further w/u per cardiology  # Dyspnea Likely 2/2 above processes. Cxr clear, covid/flu/rsv neg, but patient does reports productive cough - rvp - sputum for culture - holding on abx for now, consider repeat cxr if symptoms persist despite treating cardiac conditions  # T2DM Euglycemic - SSI for now  # CKD 3b Gfr upper 20s which is a bit off from baseline - hold home losart while diuresing  # Debility - PT consult  # Obesity Noted   DVT prophylaxis: IV heparin Code Status: dnr Family Communication: daughter updated @ bedside 12/7  Level of care: Progressive Status is: Inpatient Remains inpatient appropriate because: need for IV therapeutics    Consultants:  cardiology  Procedures: None thus far  Antimicrobials:  none    Subjective: No current chest pain. Remains dyspneic  Objective: Vitals:   11/06/23 0700 11/06/23 0830 11/06/23 0930 11/06/23 0949  BP: 122/64 111/60 112/68   Pulse:  72    Resp: (!) 23 16    Temp:    98.5 F (36.9 C)  TempSrc:    Oral  SpO2:      Weight:      Height:        Intake/Output Summary (Last 24 hours) at 11/06/2023 0952 Last data filed at 11/06/2023 4782 Gross per 24 hour  Intake 106.77 ml  Output --  Net 106.77 ml   Filed Weights   11/05/23 1750  Weight: 87.1 kg    Examination:  General exam: Appears calm and comfortable  Respiratory system: rales at bases otherwise  clear Cardiovascular system: S1 & S2 heard, RRR. Soft systolic murmur Gastrointestinal system: Abdomen is nondistended, obese, soft and nontender.   Central nervous system: Alert and oriented. No focal neurological deficits. Extremities: Symmetric 5 x 5 power. Pitting edema to thighs Skin: No rashes, lesions or ulcers Psychiatry: Judgement and insight appear normal. Mood & affect appropriate.     Data Reviewed: I have personally reviewed following labs and imaging studies  CBC: Recent Labs  Lab  11/05/23 1802  WBC 12.1*  HGB 12.2  HCT 38.1  MCV 94.5  PLT 122*   Basic Metabolic Panel: Recent Labs  Lab 11/05/23 1802  NA 142  K 4.2  CL 104  CO2 25  GLUCOSE 174*  BUN 43*  CREATININE 1.78*  CALCIUM 9.4   GFR: Estimated Creatinine Clearance: 24.8 mL/min (A) (by C-G formula based on SCr of 1.78 mg/dL (H)). Liver Function Tests: No results for input(s): "AST", "ALT", "ALKPHOS", "BILITOT", "PROT", "ALBUMIN" in the last 168 hours. No results for input(s): "LIPASE", "AMYLASE" in the last 168 hours. No results for input(s): "AMMONIA" in the last 168 hours. Coagulation Profile: Recent Labs  Lab 11/05/23 2341  INR 1.2   Cardiac Enzymes: No results for input(s): "CKTOTAL", "CKMB", "CKMBINDEX", "TROPONINI" in the last 168 hours. BNP (last 3 results) Recent Labs    08/25/23 1123 09/14/23 1455 10/25/23 1412  PROBNP 3,467* 531.0* 779.0*   HbA1C: No results for input(s): "HGBA1C" in the last 72 hours. CBG: Recent Labs  Lab 11/06/23 0203 11/06/23 0941  GLUCAP 152* 219*   Lipid Profile: No results for input(s): "CHOL", "HDL", "LDLCALC", "TRIG", "CHOLHDL", "LDLDIRECT" in the last 72 hours. Thyroid Function Tests: No results for input(s): "TSH", "T4TOTAL", "FREET4", "T3FREE", "THYROIDAB" in the last 72 hours. Anemia Panel: No results for input(s): "VITAMINB12", "FOLATE", "FERRITIN", "TIBC", "IRON", "RETICCTPCT" in the last 72 hours. Urine analysis:    Component Value Date/Time   COLORURINE YELLOW 05/31/2023 1743   APPEARANCEUR CLEAR 05/31/2023 1743   LABSPEC 1.015 05/31/2023 1743   PHURINE 6.0 05/31/2023 1743   GLUCOSEU NEGATIVE 05/31/2023 1743   HGBUR NEGATIVE 05/31/2023 1743   BILIRUBINUR NEGATIVE 05/31/2023 1743   BILIRUBINUR Negative 04/05/2023 1616   KETONESUR NEGATIVE 05/31/2023 1743   PROTEINUR NEGATIVE 05/31/2023 1743   UROBILINOGEN 0.2 04/05/2023 1616   UROBILINOGEN 0.2 08/22/2022 1432   NITRITE NEGATIVE 05/31/2023 1743   LEUKOCYTESUR NEGATIVE  05/31/2023 1743   Sepsis Labs: @LABRCNTIP (procalcitonin:4,lacticidven:4)  ) Recent Results (from the past 240 hour(s))  Resp panel by RT-PCR (RSV, Flu A&B, Covid) Anterior Nasal Swab     Status: None   Collection Time: 11/05/23  6:02 PM   Specimen: Anterior Nasal Swab  Result Value Ref Range Status   SARS Coronavirus 2 by RT PCR NEGATIVE NEGATIVE Final    Comment: (NOTE) SARS-CoV-2 target nucleic acids are NOT DETECTED.  The SARS-CoV-2 RNA is generally detectable in upper respiratory specimens during the acute phase of infection. The lowest concentration of SARS-CoV-2 viral copies this assay can detect is 138 copies/mL. A negative result does not preclude SARS-Cov-2 infection and should not be used as the sole basis for treatment or other patient management decisions. A negative result may occur with  improper specimen collection/handling, submission of specimen other than nasopharyngeal swab, presence of viral mutation(s) within the areas targeted by this assay, and inadequate number of viral copies(<138 copies/mL). A negative result must be combined with clinical observations, patient history, and epidemiological information. The expected result is Negative.  Fact Sheet for Patients:  BloggerCourse.com  Fact Sheet for Healthcare Providers:  SeriousBroker.it  This test is no t yet approved or cleared by the [place] FDA and  has been authorized for detection and/or diagnosis of SARS-CoV-2 by FDA under an Emergency Use Authorization (EUA). This EUA will remain  in effect (meaning this test can be used) for the duration of the COVID-19 declaration under Section 564(b)(1) of the Act, 21 U.S.C.section 360bbb-3(b)(1), unless the authorization is terminated  or revoked sooner.       Influenza A by PCR NEGATIVE NEGATIVE Final   Influenza B by PCR NEGATIVE NEGATIVE Final    Comment: (NOTE) The Xpert Xpress  SARS-CoV-2/FLU/RSV plus assay is intended as an aid in the diagnosis of influenza from Nasopharyngeal swab specimens and should not be used as a sole basis for treatment. Nasal washings and aspirates are unacceptable for Xpert Xpress SARS-CoV-2/FLU/RSV testing.  Fact Sheet for Patients: BloggerCourse.com  Fact Sheet for Healthcare Providers: SeriousBroker.it  This test is not yet approved or cleared by the [place] FDA and has been authorized for detection and/or diagnosis of SARS-CoV-2 by FDA under an Emergency Use Authorization (EUA). This EUA will remain in effect (meaning this test can be used) for the duration of the COVID-19 declaration under Section 564(b)(1) of the Act, 21 U.S.C. section 360bbb-3(b)(1), unless the authorization is terminated or revoked.     Resp Syncytial Virus by PCR NEGATIVE NEGATIVE Final    Comment: (NOTE) Fact Sheet for Patients: BloggerCourse.com  Fact Sheet for Healthcare Providers: SeriousBroker.it  This test is not yet approved or cleared by the [place] FDA and has been authorized for detection and/or diagnosis of SARS-CoV-2 by FDA under an Emergency Use Authorization (EUA). This EUA will remain in effect (meaning this test can be used) for the duration of the COVID-19 declaration under Section 564(b)(1) of the Act, 21 U.S.C. section 360bbb-3(b)(1), unless the authorization is terminated or revoked.  Performed at [facility], [address] [place], [place] [address]          Radiology Studies: DG Chest 2 View  Result Date: 11/05/2023 CLINICAL DATA:  Chest pain, cough EXAM: CHEST - 2 VIEW COMPARISON:  10/25/2023 FINDINGS: Lungs are essentially clear.  No pleural effusion or pneumothorax. The heart is normal in size.  Thoracic aortic atherosclerosis. Visualized osseous structures are within normal limits. IMPRESSION:  Normal chest radiographs. Electronically Signed   By: Charline Bills M.D.   On: 11/05/2023 19:06        Scheduled Meds:  [START ON 11/07/2023] aspirin EC  81 mg Oral Daily   carvedilol  12.5 mg Oral BID WC   ezetimibe  10 mg Oral Daily   gabapentin  300 mg Oral TID   insulin aspart  0-15 Units Subcutaneous TID WC   insulin aspart  0-5 Units Subcutaneous QHS   losartan  50 mg Oral Daily   torsemide  10 mg Oral Daily   Continuous Infusions:  heparin 800 Units/hr (11/06/23 0923)     LOS: 0 days     Silvano Bilis, MD [facility]   If 7PM-7AM, please contact night-coverage [email or web address] Password [facility] 11/06/2023, 9:52 AM

## 2023-11-06 NOTE — Progress Notes (Signed)
Patient's CBG was down to 39, 240 ml of juice and ice cream given, post CBG 94.

## 2023-11-06 NOTE — Assessment & Plan Note (Addendum)
Nonobstructive CAD on cath 2022 (20% mid LAD stenosis) Patient with recurrent chest pain, hospitalized for same in July 2024 Continue heparin infusion Continue GDMT, carvedilol, losartan, ezetimibe and aspirin Nitro sublingual as needed chest pain with morphine for breakthrough Cardiology consult

## 2023-11-06 NOTE — Progress Notes (Signed)
PHARMACY - ANTICOAGULATION CONSULT NOTE  Pharmacy Consult for Heparin  Indication: chest pain/ACS  Allergies  Allergen Reactions   Penicillins Swelling   Sulfa Antibiotics Swelling   Amlodipine Swelling    LE Swelling   Lisinopril Cough    Patient Measurements: Height: 5\' 1"  (154.9 cm) Weight: 87.1 kg (192 lb) IBW/kg (Calculated) : 47.8 Heparin Dosing Weight: 68 kg   Vital Signs: Temp: 98 F (36.7 C) (12/07 1541) Temp Source: Oral (12/07 1334) BP: 104/71 (12/07 1541) Pulse Rate: 60 (12/07 1541)  Labs: Recent Labs    11/05/23 1802 11/05/23 2211 11/05/23 2341 11/06/23 0922 11/06/23 1703  HGB 12.2  --   --   --   --   HCT 38.1  --   --   --   --   PLT 122*  --   --   --   --   APTT  --   --  33  --   --   LABPROT  --   --  15.8*  --   --   INR  --   --  1.2  --   --   HEPARINUNFRC  --   --   --  0.35 0.34  CREATININE 1.78*  --   --   --   --   TROPONINIHS 96* 130* 158*  --   --     Estimated Creatinine Clearance: 24.8 mL/min (A) (by C-G formula based on SCr of 1.78 mg/dL (H)).   Medical History: Past Medical History:  Diagnosis Date   Arthritis    Breast cancer (HCC) 2007   Right breast, s/p mastectomy   Chronic diastolic congestive heart failure (HCC)    per patient, dx by cardiology   Diabetes mellitus without complication (HCC)    GERD (gastroesophageal reflux disease)    Gout    Hyperlipidemia    Hypertension    Spinal stenosis     Medications:  Medications Prior to Admission  Medication Sig Dispense Refill Last Dose   acetaminophen (TYLENOL) 650 MG CR tablet Take 650 mg by mouth 2 (two) times daily.   11/05/2023 at 0900   albuterol (VENTOLIN HFA) 108 (90 Base) MCG/ACT inhaler INHALE 2 PUFFS BY MOUTH EVERY 6 HOURS AS NEEDED (Patient taking differently: Inhale 2 puffs into the lungs every 6 (six) hours as needed for wheezing or shortness of breath.) 8.5 each 2 prn   ascorbic acid (VITAMIN C) 1000 MG tablet Take 1,000 mg by mouth daily.   11/04/2023    aspirin EC 81 MG tablet Take 1 tablet (81 mg total) by mouth daily. Swallow whole. 90 tablet 3 11/04/2023 at 2030   Benzocaine-Benzethonium [facility] EX) Apply 1 application  topically daily as needed. Roll-on daily as needed for knee pain.   11/05/2023   benzonatate (TESSALON) 100 MG capsule Take 1 capsule (100 mg total) by mouth 3 (three) times daily as needed for cough. 30 capsule 0 11/01/2023   brimonidine (ALPHAGAN) 0.2 % ophthalmic solution Place 1 drop into both eyes 3 (three) times daily.   11/05/2023 at 0900   carvedilol (COREG) 12.5 MG tablet Take 1 tablet (12.5 mg total) by mouth 2 (two) times daily with a meal. 180 tablet 3 11/05/2023 at 0900   Cholecalciferol (VITAMIN D-3 PO) Take 2,000 Units by mouth daily.   11/05/2023 at 0900   Cyanocobalamin (B-12 PO) Take 1 tablet by mouth daily.   11/03/2023   Dulaglutide (TRULICITY) 1.5 MG/0.5ML SOAJ INJECT 1.5 MG (0.5ML) UNDER THE  SKIN ONCE A WEEK 2 mL 2 10/29/2023   ezetimibe (ZETIA) 10 MG tablet Take 1 tablet (10 mg total) by mouth daily. 90 tablet 3 11/05/2023 at 0900   fluticasone (FLONASE) 50 MCG/ACT nasal spray SPRAY 2 SPRAYS INTO EACH NOSTRIL EVERY DAY 48 mL 1 11/05/2023   gabapentin (NEURONTIN) 300 MG capsule Take 1 capsule (300 mg total) by mouth 3 (three) times daily. 270 capsule 1 11/05/2023 at 0900   glipiZIDE (GLUCOTROL) 5 MG tablet Take 1 tablet (5 mg total) by mouth 2 (two) times daily before a meal. 180 tablet 3 11/05/2023 at 0900   ketorolac (ACULAR) 0.5 % ophthalmic solution INSTILL 1 DROP INTO RIGHT EYE 4 TIMES A DAY (Patient taking differently: Place 1 drop into the right eye 4 (four) times daily. INSTILL 1 DROP INTO RIGHT EYE 3 TIMES A DAY) 5 mL 6 11/04/2023   latanoprost (XALATAN) 0.005 % ophthalmic solution Place 1 drop into both eyes daily.   11/04/2023 at 2030   levocetirizine (XYZAL) 5 MG tablet TAKE 1 TABLET BY MOUTH EVERY DAY IN THE EVENING (Patient taking differently: Take 5 mg by mouth every evening.) 90 tablet 1 11/04/2023 at  2030   losartan (COZAAR) 50 MG tablet TAKE 1 TABLET BY MOUTH EVERY DAY 90 tablet 3 11/05/2023 at 0900   omeprazole (PRILOSEC) 40 MG capsule TAKE 1 CAP 2 TIMES DAILY. PLEASE CALL [phone] TO SCHEDULE AN OFFICE VISIT FOR MORE REFILLS. 180 capsule 0 11/05/2023 at 0900   OneTouch Delica Lancets 33G MISC Use as instructed to check blood sugar 2-3 times a day.  DX E11.9 (Patient taking differently: 1 each by Other route See admin instructions. Use as instructed to check blood sugar 2-3 times a day.  DX E11.9) 100 each 3 11/05/2023   ONETOUCH ULTRA TEST test strip CHECK BLOOD SUGAR 2 TO 3 TIMES DAILY AS DIRECTED (Patient taking differently: 1 each by Other route as needed for other.) 100 strip 5 11/05/2023   timolol (TIMOPTIC) 0.5 % ophthalmic solution Place 1 drop into both eyes 2 (two) times a day.   11/05/2023 at 0900   torsemide (DEMADEX) 10 MG tablet TAKE 1 TABLET BY MOUTH EVERY DAY 90 tablet 1 11/05/2023 at 0900   Alcohol Swabs (ALCOHOL PREP PAD) 70 % PADS Use to check blood sugars (Patient taking differently: 1 each by Other route See admin instructions. Use to check blood sugars) 100 each 3    blood glucose meter kit and supplies KIT Dispense based on patient and insurance preference. Use up to four times daily as directed. (FOR ICD-9 250.00, 250.01). (Patient taking differently: Inject 1 each into the skin as directed. Dispense based on patient and insurance preference. Use up to four times daily as directed. (FOR ICD-9 250.00, 250.01).) 1 each 11    clindamycin (CLEOCIN) 150 MG capsule Take 1 capsule (150 mg total) by mouth 3 (three) times daily. (Patient not taking: Reported on 11/06/2023) 15 capsule 0 Not Taking   FLUZONE HIGH-DOSE 0.5 ML injection       Assessment: Pharmacy consulted to dose heparin in this 82 year old female with ACS/NSTEMI.  No prior anticoag noted. CrCl = 24.8 ml/min  12/7 0922 HL 0.35,  therapeutic x1 12/7 1703 HL 0.34, therapeutic x2  Goal of Therapy:  Heparin level  0.3-0.7 units/ml Monitor platelets by anticoagulation protocol: Yes   Plan:  No issues with infusion reported, no signs/symptoms of bleeding noted Continue heparin infusion at 800 units/hr Monitor daily Anti-Xa levels while on heparin  Continue to monitor H&H and platelets  Thank you for involving pharmacy in this patient's care.   Rockwell Alexandria, PharmD Clinical Pharmacist 11/06/2023 5:43 PM

## 2023-11-06 NOTE — Progress Notes (Signed)
PHARMACY - ANTICOAGULATION CONSULT NOTE  Pharmacy Consult for Heparin  Indication: chest pain/ACS  Allergies  Allergen Reactions   Penicillins Swelling   Sulfa Antibiotics Swelling   Amlodipine Swelling    LE Swelling   Lisinopril Cough    Patient Measurements: Height: 5\' 1"  (154.9 cm) Weight: 87.1 kg (192 lb) IBW/kg (Calculated) : 47.8 Heparin Dosing Weight: 68 kg   Vital Signs: Temp: 98.5 F (36.9 C) (12/07 0949) Temp Source: Oral (12/07 0949) BP: 112/68 (12/07 0930) Pulse Rate: 69 (12/07 0900)  Labs: Recent Labs    11/05/23 1802 11/05/23 2211 11/05/23 2341 11/06/23 0922  HGB 12.2  --   --   --   HCT 38.1  --   --   --   PLT 122*  --   --   --   APTT  --   --  33  --   LABPROT  --   --  15.8*  --   INR  --   --  1.2  --   HEPARINUNFRC  --   --   --  0.35  CREATININE 1.78*  --   --   --   TROPONINIHS 96* 130* 158*  --     Estimated Creatinine Clearance: 24.8 mL/min (A) (by C-G formula based on SCr of 1.78 mg/dL (H)).   Medical History: Past Medical History:  Diagnosis Date   Arthritis    Breast cancer [facility]) 2007   Right breast, s/p mastectomy   Chronic diastolic congestive heart failure (HCC)    per patient, dx by cardiology   Diabetes mellitus without complication (HCC)    GERD (gastroesophageal reflux disease)    Gout    Hyperlipidemia    Hypertension    Spinal stenosis     Medications:  (Not in a hospital admission)   Assessment: Pharmacy consulted to dose heparin in this 82 year old female with ACS/NSTEMI.  No prior anticoag noted. CrCl = 24.8 ml/min  12/7 0922 HL 0.35,  therapeutic x1  Goal of Therapy:  Heparin level 0.3-0.7 units/ml Monitor platelets by anticoagulation protocol: Yes   Plan:  12/7 0922 HL 0.35,  therapeutic x1 continue heparin infusion at 800 units/hr Check confirmatory HL in 8 hours and daily while on heparin Continue to monitor H&H and platelets  Bari Mantis PharmD Clinical Pharmacist 11/06/2023

## 2023-11-06 NOTE — IPAL (Signed)
  Interdisciplinary Goals of Care Family Meeting   Date carried out: 11/06/2023  Location of the meeting: Bedside  Member's involved: Physician and Family Member or next of kin  Durable Power of Attorney or Environmental health practitioner: patient    Discussion: We discussed goals of care for Donna Fuller .   I have reviewed medical records including EPIC notes, labs and imaging, assessed the patient and then met with patient and daughter to discuss major active diagnoses, plan of care, natural trajectory, prognosis, GOC, EOL wishes, disposition and options including Full code/DNI/DNR and the concept of comfort care if DNR is elected. Questions and concerns were addressed. They are  in agreement to continue current plan of care . Election for DNR/DNI status.   Code status:   Code Status: Limited: Do not attempt resuscitation (DNR) -DNR-LIMITED -Do Not Intubate/DNI    Disposition: Continue current acute care  Time spent for the meeting: 32    Donna Baumann, MD  11/06/2023, 4:03 AM

## 2023-11-06 NOTE — Progress Notes (Signed)
PHARMACY - ANTICOAGULATION CONSULT NOTE  Pharmacy Consult for Heparin  Indication: chest pain/ACS  Allergies  Allergen Reactions   Penicillins Swelling   Sulfa Antibiotics Swelling   Amlodipine Swelling    LE Swelling   Lisinopril Cough    Patient Measurements: Height: 5\' 1"  (154.9 cm) Weight: 87.1 kg (192 lb) IBW/kg (Calculated) : 47.8 Heparin Dosing Weight: 68 kg   Vital Signs: Temp: 99.5 F (37.5 C) (12/07 0014) Temp Source: Oral (12/07 0014) BP: 134/63 (12/07 0014) Pulse Rate: 76 (12/07 0014)  Labs: Recent Labs    11/05/23 1802 11/05/23 2211 11/05/23 2341  HGB 12.2  --   --   HCT 38.1  --   --   PLT 122*  --   --   APTT  --   --  33  LABPROT  --   --  15.8*  INR  --   --  1.2  CREATININE 1.78*  --   --   TROPONINIHS 96* 130* 158*    Estimated Creatinine Clearance: 24.8 mL/min (A) (by C-G formula based on SCr of 1.78 mg/dL (H)).   Medical History: Past Medical History:  Diagnosis Date   Arthritis    Breast cancer [facility]) 2007   Right breast, s/p mastectomy   Chronic diastolic congestive heart failure (HCC)    per patient, dx by cardiology   Diabetes mellitus without complication (HCC)    GERD (gastroesophageal reflux disease)    Gout    Hyperlipidemia    Hypertension    Spinal stenosis     Medications:  (Not in a hospital admission)   Assessment: Pharmacy consulted to dose heparin in this 82 year old female with ACS/NSTEMI.  No prior anticoag noted. CrCl = 24.8 ml/min  Goal of Therapy:  Heparin level 0.3-0.7 units/ml Monitor platelets by anticoagulation protocol: Yes   Plan:  Give 4000 units bolus x 1 Start heparin infusion at 800 units/hr Check anti-Xa level in 8 hours and daily while on heparin Continue to monitor H&H and platelets  Joice Nazario D 11/06/2023,12:36 AM

## 2023-11-06 NOTE — Assessment & Plan Note (Addendum)
Spinal stenosis. No acute issues

## 2023-11-06 NOTE — H&P (Signed)
History and Physical    Patient: Donna Fuller WJX:[id number] DOB: Jun 13, 1941 DOA: 11/05/2023 DOS: the patient was seen and examined on 11/06/2023 PCP: Esperanza Richters, PA-C  Patient coming from: Home  Chief Complaint:  Chief Complaint  Patient presents with   Shortness of Breath    HPI: Donna Fuller is a 82 y.o. female with medical history significant for DM, diastolic CHF (EF 65 to 70%, G2 DD 05/2023), CKD 3b, HTN,  nonobstructive CAD on cath 2022(20% LAD stenosis), last hospitalized from 7/12 to 06/13/2023 for CHF exacerbation and chest pain with flat troponin, who is being admitted for possible NSTEMI. Patient developed chest pain and shortness of breath on the day of arrival however this was preceded by a week of minimally productive cough and generalized malaise.  She denied fever or chills, lower extremity pain or swelling. Patient was last evaluated by her cardiologist on 10/06/2023 and was found to be stable. She is chest pain-free on arrival ED course and data review: Tmax 99.8, BP 152/77 with pulse 77 respiration 17 and O2 sat 95% on room air Pertinent labs include the following: Troponin 96--130 and BNP 653 WBC 12,000 with negative respiratory viral panel Hemoglobin 12.2 with mild thrombocytopenia of 122000 Creatinine at baseline at 1.78 EKG, personally viewed and interpreted showing NSR at 77 with no acute ST-T wave changes Chest x-ray clear Patient started on heparin infusion for possible NSTEMI Hospitalist consulted for admission.   Review of Systems: As mentioned in the history of present illness. All other systems reviewed and are negative.  Past Medical History:  Diagnosis Date   Arthritis    Breast cancer [facility]) 2007   Right breast, s/p mastectomy   Chronic diastolic congestive heart failure (HCC)    per patient, dx by cardiology   Diabetes mellitus without complication (HCC)    GERD (gastroesophageal reflux disease)    Gout    Hyperlipidemia     Hypertension    Spinal stenosis    Past Surgical History:  Procedure Laterality Date   ABDOMINAL HYSTERECTOMY     ABDOMINAL SURGERY     BREAST BIOPSY Left    2016 benign    BREAST BIOPSY Left 02/16/2023   MM LT BREAST BX W LOC DEV 1ST LESION IMAGE BX SPEC STEREO GUIDE 02/16/2023 [facility]   LEFT HEART CATH AND CORONARY ANGIOGRAPHY N/A 11/21/2021   Procedure: LEFT HEART CATH AND CORONARY ANGIOGRAPHY;  Surgeon: Iran Ouch, MD;  Location: [facility] INVASIVE CV LAB;  Service: Cardiovascular;  Laterality: N/A;   MASTECTOMY     right side   REPLACEMENT TOTAL KNEE Bilateral    TUBAL LIGATION     Social History:  reports that she has quit smoking. Her smoking use included cigarettes. She has never been exposed to tobacco smoke. She has never used smokeless tobacco. She reports that she does not drink alcohol and does not use drugs.  Allergies  Allergen Reactions   Penicillins Swelling   Sulfa Antibiotics Swelling   Amlodipine Swelling    LE Swelling   Lisinopril Cough    Family History  Problem Relation Age of Onset   Diabetes Mother    Alzheimer's disease Mother    Hypertension Mother    Hypertension Father    Heart disease Father    Hyperlipidemia Father    Kidney disease Father    Diabetes Sister    Cancer Brother        lung   Alzheimer's disease Brother    Heart  disease Brother    Hypertension Brother    Diabetes Brother    Hypertension Brother    Diabetes Brother    Hyperlipidemia Son    Hypertension Son    Diabetes Son    Hypertension Daughter    Hyperlipidemia Daughter     Prior to Admission medications   Medication Sig Start Date End Date Taking? Authorizing Provider  acetaminophen (TYLENOL) 650 MG CR tablet Take 650 mg by mouth 2 (two) times daily.    [provider]  albuterol (VENTOLIN HFA) 108 (90 Base) MCG/ACT inhaler INHALE 2 PUFFS BY MOUTH EVERY 6 HOURS AS NEEDED Patient taking differently: Inhale 2 puffs into the lungs every 6 (six)  hours as needed for wheezing or shortness of breath. 11/09/22   Saguier, Ramon Dredge, PA-C  Alcohol Swabs (ALCOHOL PREP PAD) 70 % PADS Use to check blood sugars Patient taking differently: 1 each by Other route See admin instructions. Use to check blood sugars 09/01/23   Shamleffer, Konrad Dolores, MD  ascorbic acid (VITAMIN C) 1000 MG tablet Take 1,000 mg by mouth daily.    [provider]  aspirin EC 81 MG tablet Take 1 tablet (81 mg total) by mouth daily. Swallow whole. 09/02/23   Georgeanna Lea, MD  benzonatate (TESSALON) 100 MG capsule Take 1 capsule (100 mg total) by mouth 3 (three) times daily as needed for cough. 10/01/23   Saguier, Ramon Dredge, PA-C  blood glucose meter kit and supplies KIT Dispense based on patient and insurance preference. Use up to four times daily as directed. (FOR ICD-9 250.00, 250.01). Patient taking differently: Inject 1 each into the skin as directed. Dispense based on patient and insurance preference. Use up to four times daily as directed. (FOR ICD-9 250.00, 250.01). 07/09/17   Everlene Other G, DO  brimonidine (ALPHAGAN) 0.2 % ophthalmic solution Place 1 drop into both eyes 3 (three) times daily. 05/08/19   [provider]  carvedilol (COREG) 12.5 MG tablet Take 1 tablet (12.5 mg total) by mouth 2 (two) times daily with a meal. 08/30/23   Georgeanna Lea, MD  Cholecalciferol (VITAMIN D-3 PO) Take 10,000 Units by mouth daily.    [provider]  clindamycin (CLEOCIN) 150 MG capsule Take 1 capsule (150 mg total) by mouth 3 (three) times daily. 10/06/23   Saguier, Ramon Dredge, PA-C  Cyanocobalamin (B-12 PO) Take 1 tablet by mouth daily.    [provider]  Dulaglutide (TRULICITY) 1.5 MG/0.5ML SOAJ INJECT 1.5 MG (0.5ML) UNDER THE SKIN ONCE A WEEK 10/18/23   Shamleffer, Konrad Dolores, MD  ezetimibe (ZETIA) 10 MG tablet Take 1 tablet (10 mg total) by mouth daily. 08/30/23 11/28/23  Georgeanna Lea, MD  fluticasone [facility]) 50 MCG/ACT nasal spray  SPRAY 2 SPRAYS INTO EACH NOSTRIL EVERY DAY 10/25/23   Saguier, Ramon Dredge, PA-C  FLUZONE HIGH-DOSE 0.5 ML injection  08/23/23   [provider]  gabapentin (NEURONTIN) 300 MG capsule Take 1 capsule (300 mg total) by mouth 3 (three) times daily. 09/15/23   Saguier, Ramon Dredge, PA-C  glipiZIDE (GLUCOTROL) 5 MG tablet Take 1 tablet (5 mg total) by mouth 2 (two) times daily before a meal. 10/21/23   Shamleffer, Konrad Dolores, MD  ketorolac (ACULAR) 0.5 % ophthalmic solution INSTILL 1 DROP INTO RIGHT EYE 4 TIMES A DAY Patient taking differently: Place 1 drop into the right eye 4 (four) times daily. INSTILL 1 DROP INTO RIGHT EYE 3 TIMES A DAY 04/07/22   Rankin, Alford Highland, MD  latanoprost (XALATAN) 0.005 %  ophthalmic solution Place 1 drop into both eyes daily. 05/05/19   [provider]  levocetirizine (XYZAL) 5 MG tablet TAKE 1 TABLET BY MOUTH EVERY DAY IN THE EVENING Patient taking differently: Take 5 mg by mouth every evening. 07/19/23   Saguier, Ramon Dredge, PA-C  losartan (COZAAR) 50 MG tablet TAKE 1 TABLET BY MOUTH EVERY DAY 09/10/23   Saguier, Ramon Dredge, PA-C  omeprazole (PRILOSEC) 40 MG capsule TAKE 1 CAP 2 TIMES DAILY. PLEASE CALL [phone] TO SCHEDULE AN OFFICE VISIT FOR MORE REFILLS. 11/01/23   Unk Lightning, PA  OneTouch Delica Lancets 33G MISC Use as instructed to check blood sugar 2-3 times a day.  DX E11.9 Patient taking differently: 1 each by Other route See admin instructions. Use as instructed to check blood sugar 2-3 times a day.  DX E11.9 10/21/21   Shamleffer, Konrad Dolores, MD  ONETOUCH ULTRA TEST test strip CHECK BLOOD SUGAR 2 TO 3 TIMES DAILY AS DIRECTED Patient taking differently: 1 each by Other route as needed for other. 07/12/23   Saguier, Ramon Dredge, PA-C  timolol (TIMOPTIC) 0.5 % ophthalmic solution Place 1 drop into both eyes 2 (two) times a day. 03/15/19   [provider]  torsemide (DEMADEX) 10 MG tablet TAKE 1 TABLET BY MOUTH EVERY DAY 10/15/23   Esperanza Richters,  [place]    Physical Exam: Vitals:   11/05/23 1750 11/05/23 1751 11/05/23 2210 11/06/23 0014  BP:  (!) 152/77 (!) 141/70 134/63  Pulse:  79 77 76  Resp:  18 17 (!) 21  Temp:  99.1 F (37.3 C) 99.8 F (37.7 C) 99.5 F (37.5 C)  TempSrc:   Oral Oral  SpO2:  95% 95% 100%  Weight: 87.1 kg     Height: 5\' 1"  (1.549 m)      Physical Exam Vitals and nursing note reviewed.  Constitutional:      General: She is not in acute distress.    Appearance: She is ill-appearing.  HENT:     Head: Normocephalic and atraumatic.  Cardiovascular:     Rate and Rhythm: Normal rate and regular rhythm.     Heart sounds: Normal heart sounds.  Pulmonary:     Effort: Pulmonary effort is normal.     Breath sounds: Normal breath sounds.  Abdominal:     Palpations: Abdomen is soft.     Tenderness: There is no abdominal tenderness.  Neurological:     Mental Status: Mental status is at baseline.     Labs on Admission: I have personally reviewed following labs and imaging studies  CBC: Recent Labs  Lab 11/05/23 1802  WBC 12.1*  HGB 12.2  HCT 38.1  MCV 94.5  PLT 122*   Basic Metabolic Panel: Recent Labs  Lab 11/05/23 1802  NA 142  K 4.2  CL 104  CO2 25  GLUCOSE 174*  BUN 43*  CREATININE 1.78*  CALCIUM 9.4   GFR: Estimated Creatinine Clearance: 24.8 mL/min (A) (by C-G formula based on SCr of 1.78 mg/dL (H)). Liver Function Tests: No results for input(s): "AST", "ALT", "ALKPHOS", "BILITOT", "PROT", "ALBUMIN" in the last 168 hours. No results for input(s): "LIPASE", "AMYLASE" in the last 168 hours. No results for input(s): "AMMONIA" in the last 168 hours. Coagulation Profile: Recent Labs  Lab 11/05/23 2341  INR 1.2   Cardiac Enzymes: No results for input(s): "CKTOTAL", "CKMB", "CKMBINDEX", "TROPONINI" in the last 168 hours. BNP (last 3 results) Recent Labs    08/25/23 1123 09/14/23 1455 10/25/23 1412  PROBNP 3,467*  531.0* 779.0*   HbA1C: No results for input(s): "HGBA1C" in  the last 72 hours. CBG: No results for input(s): "GLUCAP" in the last 168 hours. Lipid Profile: No results for input(s): "CHOL", "HDL", "LDLCALC", "TRIG", "CHOLHDL", "LDLDIRECT" in the last 72 hours. Thyroid Function Tests: No results for input(s): "TSH", "T4TOTAL", "FREET4", "T3FREE", "THYROIDAB" in the last 72 hours. Anemia Panel: No results for input(s): "VITAMINB12", "FOLATE", "FERRITIN", "TIBC", "IRON", "RETICCTPCT" in the last 72 hours. Urine analysis:    Component Value Date/Time   COLORURINE YELLOW 05/31/2023 1743   APPEARANCEUR CLEAR 05/31/2023 1743   LABSPEC 1.015 05/31/2023 1743   PHURINE 6.0 05/31/2023 1743   GLUCOSEU NEGATIVE 05/31/2023 1743   HGBUR NEGATIVE 05/31/2023 1743   BILIRUBINUR NEGATIVE 05/31/2023 1743   BILIRUBINUR Negative 04/05/2023 1616   KETONESUR NEGATIVE 05/31/2023 1743   PROTEINUR NEGATIVE 05/31/2023 1743   UROBILINOGEN 0.2 04/05/2023 1616   UROBILINOGEN 0.2 08/22/2022 1432   NITRITE NEGATIVE 05/31/2023 1743   LEUKOCYTESUR NEGATIVE 05/31/2023 1743    Radiological Exams on Admission: DG Chest 2 View  Result Date: 11/05/2023 CLINICAL DATA:  Chest pain, cough EXAM: CHEST - 2 VIEW COMPARISON:  10/25/2023 FINDINGS: Lungs are essentially clear.  No pleural effusion or pneumothorax. The heart is normal in size.  Thoracic aortic atherosclerosis. Visualized osseous structures are within normal limits. IMPRESSION: Normal chest radiographs. Electronically Signed   By: Charline Bills M.D.   On: 11/05/2023 19:06     Data Reviewed: Relevant notes from primary care and specialist visits, past discharge summaries as available in EHR, including Care Everywhere. Prior diagnostic testing as pertinent to current admission diagnoses Updated medications and problem lists for reconciliation ED course, including vitals, labs, imaging, treatment and response to treatment Triage notes, nursing and pharmacy notes and ED provider's notes Notable results as noted in  HPI   Assessment and Plan: * NSTEMI (non-ST elevated myocardial infarction) (HCC) Nonobstructive CAD on cath 2022 (20% mid LAD stenosis) Patient with recurrent chest pain, hospitalized for same in July 2024 Continue heparin infusion Continue GDMT, carvedilol, losartan, ezetimibe and aspirin Nitro sublingual as needed chest pain with morphine for breakthrough Cardiology consult   (HFpEF) heart failure with preserved ejection fraction (HCC) Clinically euvolemic Continue home GDMT with carvedilol, losartan and continue torsemide  Obesity, Class III, BMI 40-49.9 (morbid obesity) (HCC) Complicating factor to overall prognosis and care  Thrombocytopenia (HCC) Appears chronic Continue to monitor  DDD (degenerative disc disease), lumbar Spinal stenosis. No acute issues  Type 2 diabetes mellitus with hyperglycemia, without long-term current use of insulin (HCC) Sliding scale insulin coverage  Stage 3b chronic kidney disease (HCC) Renal function at baseline  History of breast cancer No acute issues suspected  Hypertension Continue losartan and carvedilol     DVT prophylaxis: Heparin infusion  Consults: [facility] cardiology  Advance Care Planning:   Code Status: Limited: Do not attempt resuscitation (DNR) -DNR-LIMITED -Do Not Intubate/DNI    Family Communication: Daughter at bedside  Disposition Plan: Back to previous home environment  Severity of Illness: The appropriate patient status for this patient is INPATIENT. Inpatient status is judged to be reasonable and necessary in order to provide the required intensity of service to ensure the patient's safety. The patient's presenting symptoms, physical exam findings, and initial radiographic and laboratory data in the context of their chronic comorbidities is felt to place them at high risk for further clinical deterioration. Furthermore, it is not anticipated that the patient will be medically stable for discharge from the  hospital within  2 midnights of admission.   * I certify that at the point of admission it is my clinical judgment that the patient will require inpatient hospital care spanning beyond 2 midnights from the point of admission due to high intensity of service, high risk for further deterioration and high frequency of surveillance required.*  Author: Andris Baumann, MD 11/06/2023 12:41 AM  For on call review [email or web address].

## 2023-11-06 NOTE — Assessment & Plan Note (Signed)
Sliding scale insulin coverage 

## 2023-11-06 NOTE — Assessment & Plan Note (Signed)
Appears chronic Continue to monitor

## 2023-11-06 NOTE — Assessment & Plan Note (Signed)
 Renal function at baseline

## 2023-11-06 NOTE — Consult Note (Signed)
Cardiology Consultation   Patient ID: Donna Fuller MRN: [id number]; DOB: 1941/03/28  Admit date: 11/05/2023 Date of Consult: 11/06/2023  PCP:  Donna Fuller   [facility] Providers Cardiologist:  Donna Nordmann, MD   {   Patient Profile:   Donna Fuller is a 82 y.o. female with a hx of diabetes, diastolic heart failure, CKD stage III, atypical lobular hyperplasia of the left breast, ductal carcinoma of the right breast status postmastectomy 2007, osteoarthritis, anemia hypertension, nonobstructive CAD on cath in 2022 who is being seen 11/06/2023 for the evaluation of shortness of breath at the request of Dr. Ashok Fuller.  History of Present Illness:   [name] was admitted in 2022 with chest pain and a cough.  Cardiac cath at that time showed nonobstructive CAD with 1 area of 20% stenosis.  Echo showed EF of 60 to 65% and trivial mitral valve regurg.  The patient was admitted in July 2024 with NSTEMI.  Serum creatinine 1.47.  High-sensitivity troponin 87, 94.  BNP 593.  Patient was restarted on torsemide with improvement of breathing.  Echo showed EF of 65 to 70%, grade 2 diastolic dysfunction, normal RV SF, elevated LVEDP, moderately elevated pulmonary artery systolic pressure, mild MR.  She was last seen by Dr. Bing Fuller in November 2024.  No changes were made at that time.  The patient presented to [facility] on 11/05/2023 with shortness of breath and chest pain. Symptoms started the day before presentation and they were getting worse. Symptoms were worse with exertion. She has chronic orthopnea. She has reports worsening lower leg edema. Chest pain was coming and going. Daughter reported URI symptoms with malaise, congestion, nonproductive cough for a few days.    In the ER blood pressure 152/77, pulse 79, respiratory rate 18, 99.1 Fahrenheit, 95% O2.  Labs showed serum creatinine 1.78, BUN 43, WBC 12.1, platelets 122, high-sensitivity troponin 96>>130. Respiratory panel  negative.  EKG showed normal sinus rhythm with a heart rate of 77 bpm, first-degree AV block, TWI III.  Chest x-ray was nonacute.  Patient was started on IV heparin and admitted for further workup.  No active chest pain. She uses a cane to walk around, tries to stay active.  Past Medical History:  Diagnosis Date   Arthritis    Breast cancer [facility]) 2007   Right breast, s/p mastectomy   Chronic diastolic congestive heart failure (HCC)    per patient, dx by cardiology   Diabetes mellitus without complication (HCC)    GERD (gastroesophageal reflux disease)    Gout    Hyperlipidemia    Hypertension    Spinal stenosis     Past Surgical History:  Procedure Laterality Date   ABDOMINAL HYSTERECTOMY     ABDOMINAL SURGERY     BREAST BIOPSY Left    2016 benign    BREAST BIOPSY Left 02/16/2023   MM LT BREAST BX W LOC DEV 1ST LESION IMAGE BX SPEC STEREO GUIDE 02/16/2023 [facility] MAMMOGRAPHY   LEFT HEART CATH AND CORONARY ANGIOGRAPHY N/A 11/21/2021   Procedure: LEFT HEART CATH AND CORONARY ANGIOGRAPHY;  Surgeon: Iran Ouch, MD;  Location: [facility] INVASIVE CV LAB;  Service: Cardiovascular;  Laterality: N/A;   MASTECTOMY     right side   REPLACEMENT TOTAL KNEE Bilateral    TUBAL LIGATION       Home Medications:  Prior to Admission medications   Medication Sig Start Date End Date Taking? Authorizing Provider  acetaminophen (TYLENOL) 650 MG CR tablet Take  650 mg by mouth 2 (two) times daily.   Yes [provider]  albuterol (VENTOLIN HFA) 108 (90 Base) MCG/ACT inhaler INHALE 2 PUFFS BY MOUTH EVERY 6 HOURS AS NEEDED Patient taking differently: Inhale 2 puffs into the lungs every 6 (six) hours as needed for wheezing or shortness of breath. 11/09/22  Yes Saguier, Ramon Dredge, PA-C  ascorbic acid (VITAMIN C) 1000 MG tablet Take 1,000 mg by mouth daily.   Yes [provider]  aspirin EC 81 MG tablet Take 1 tablet (81 mg total) by mouth daily. Swallow whole. 09/02/23  Yes Georgeanna Lea, MD  Benzocaine-Benzethonium [facility] EX) Apply 1 application  topically daily as needed. Roll-on daily as needed for knee pain.   Yes [provider]  benzonatate (TESSALON) 100 MG capsule Take 1 capsule (100 mg total) by mouth 3 (three) times daily as needed for cough. 10/01/23  Yes Saguier, Ramon Dredge, PA-C  brimonidine (ALPHAGAN) 0.2 % ophthalmic solution Place 1 drop into both eyes 3 (three) times daily. 05/08/19  Yes [provider]  carvedilol (COREG) 12.5 MG tablet Take 1 tablet (12.5 mg total) by mouth 2 (two) times daily with a meal. 08/30/23  Yes Georgeanna Lea, MD  Cholecalciferol (VITAMIN D-3 PO) Take 2,000 Units by mouth daily.   Yes [provider]  Cyanocobalamin (B-12 PO) Take 1 tablet by mouth daily.   Yes [provider]  Dulaglutide (TRULICITY) 1.5 MG/0.5ML SOAJ INJECT 1.5 MG (0.5ML) UNDER THE SKIN ONCE A WEEK 10/18/23  Yes Shamleffer, Konrad Dolores, MD  ezetimibe (ZETIA) 10 MG tablet Take 1 tablet (10 mg total) by mouth daily. 08/30/23 11/28/23 Yes Georgeanna Lea, MD  fluticasone [facility]) 50 MCG/ACT nasal spray SPRAY 2 SPRAYS INTO EACH NOSTRIL EVERY DAY 10/25/23  Yes Saguier, Ramon Dredge, PA-C  gabapentin (NEURONTIN) 300 MG capsule Take 1 capsule (300 mg total) by mouth 3 (three) times daily. 09/15/23  Yes Saguier, Ramon Dredge, PA-C  glipiZIDE (GLUCOTROL) 5 MG tablet Take 1 tablet (5 mg total) by mouth 2 (two) times daily before a meal. 10/21/23  Yes Shamleffer, Konrad Dolores, MD  ketorolac (ACULAR) 0.5 % ophthalmic solution INSTILL 1 DROP INTO RIGHT EYE 4 TIMES A DAY Patient taking differently: Place 1 drop into the right eye 4 (four) times daily. INSTILL 1 DROP INTO RIGHT EYE 3 TIMES A DAY 04/07/22  Yes Rankin, Alford Highland, MD  latanoprost (XALATAN) 0.005 % ophthalmic solution Place 1 drop into both eyes daily. 05/05/19  Yes [provider]  levocetirizine (XYZAL) 5 MG tablet TAKE 1 TABLET BY MOUTH EVERY DAY IN THE EVENING Patient  taking differently: Take 5 mg by mouth every evening. 07/19/23  Yes Saguier, Ramon Dredge, PA-C  losartan (COZAAR) 50 MG tablet TAKE 1 TABLET BY MOUTH EVERY DAY 09/10/23  Yes Saguier, Ramon Dredge, PA-C  omeprazole (PRILOSEC) 40 MG capsule TAKE 1 CAP 2 TIMES DAILY. PLEASE CALL [phone] TO SCHEDULE AN OFFICE VISIT FOR MORE REFILLS. 11/01/23  Yes Lemmon, Violet Baldy, PA  OneTouch Delica Lancets 33G MISC Use as instructed to check blood sugar 2-3 times a day.  DX E11.9 Patient taking differently: 1 each by Other route See admin instructions. Use as instructed to check blood sugar 2-3 times a day.  DX E11.9 10/21/21  Yes Shamleffer, Konrad Dolores, MD  ONETOUCH ULTRA TEST test strip CHECK BLOOD SUGAR 2 TO 3 TIMES DAILY AS DIRECTED Patient taking differently: 1 each by Other route as needed for other. 07/12/23  Yes Saguier, Ramon Dredge, PA-C  timolol (TIMOPTIC) 0.5 %  ophthalmic solution Place 1 drop into both eyes 2 (two) times a day. 03/15/19  Yes [provider]  torsemide (DEMADEX) 10 MG tablet TAKE 1 TABLET BY MOUTH EVERY DAY 10/15/23  Yes Saguier, Ramon Dredge, PA-C  Alcohol Swabs (ALCOHOL PREP PAD) 70 % PADS Use to check blood sugars Patient taking differently: 1 each by Other route See admin instructions. Use to check blood sugars 09/01/23   Shamleffer, Konrad Dolores, MD  blood glucose meter kit and supplies KIT Dispense based on patient and insurance preference. Use up to four times daily as directed. (FOR ICD-9 250.00, 250.01). Patient taking differently: Inject 1 each into the skin as directed. Dispense based on patient and insurance preference. Use up to four times daily as directed. (FOR ICD-9 250.00, 250.01). 07/09/17   Tommie Sams, DO  clindamycin (CLEOCIN) 150 MG capsule Take 1 capsule (150 mg total) by mouth 3 (three) times daily. Patient not taking: Reported on 11/06/2023 10/06/23   Saguier, Ramon Dredge, PA-C  FLUZONE HIGH-DOSE 0.5 ML injection  08/23/23   [provider]    Inpatient  Medications: Scheduled Meds:  [START ON 11/07/2023] aspirin EC  81 mg Oral Daily   carvedilol  12.5 mg Oral BID WC   ezetimibe  10 mg Oral Daily   gabapentin  300 mg Oral TID   insulin aspart  0-15 Units Subcutaneous TID WC   insulin aspart  0-5 Units Subcutaneous QHS   losartan  50 mg Oral Daily   torsemide  10 mg Oral Daily   Continuous Infusions:  heparin 800 Units/hr (11/06/23 0055)   PRN Meds: acetaminophen, albuterol, guaiFENesin, nitroGLYCERIN, ondansetron (ZOFRAN) IV  Allergies:    Allergies  Allergen Reactions   Penicillins Swelling   Sulfa Antibiotics Swelling   Amlodipine Swelling    LE Swelling   Lisinopril Cough    Social History:   Social History   Socioeconomic History   Marital status: Married    Spouse name: Jimmy   Number of children: 3   Years of education: 12   Highest education level: Not on file  Occupational History   Not on file  Tobacco Use   Smoking status: Former    Types: Cigarettes    Passive exposure: Never   Smokeless tobacco: Never  Vaping Use   Vaping status: Never Used  Substance and Sexual Activity   Alcohol use: No   Drug use: No   Sexual activity: Not Currently  Other Topics Concern   Not on file  Social History Narrative   Marital Status:  Married Hydrologist)    Children:  Daughter(1) Son (1)    Pets:  None    Living Situation: Lives with husband and daughter.     Occupation:  Retired Contractor)    Education: 12th Grade    Tobacco Use/Exposure:  She used to smoke socially during the weekends but quit 40 years ago.    Alcohol Use:  Occasional   Drug Use:  None   Diet:  Regular   Exercise:  None   Hobbies:  Reading and playing volleyball             Social Determinants of Health   Financial Resource Strain: Low Risk  (10/01/2021)   Overall Financial Resource Strain (CARDIA)    Difficulty of Paying Living Expenses: Not hard at all  Food Insecurity: No Food Insecurity (06/12/2023)   Hunger Vital Sign    Worried About  Running Out of Food in the Last Year: Never true  Ran Out of Food in the Last Year: Never true  Transportation Needs: No Transportation Needs (06/12/2023)   PRAPARE - Administrator, Civil Service (Medical): No    Lack of Transportation (Non-Medical): No  Physical Activity: Inactive (10/01/2021)   Exercise Vital Sign    Days of Exercise per Week: 0 days    Minutes of Exercise per Session: 0 min  Stress: No Stress Concern Present (10/01/2021)   [employer] of Occupational Health - Occupational Stress Questionnaire    Feeling of Stress : Not at all  Social Connections: Moderately Integrated (10/01/2021)   Social Connection and Isolation Panel [NHANES]    Frequency of Communication with Friends and Family: More than three times a week    Frequency of Social Gatherings with Friends and Family: More than three times a week    Attends Religious Services: More than 4 times per year    Active Member of [employer] or Organizations: No    Attends Banker Meetings: Never    Marital Status: Married  Catering manager Violence: Not At Risk (06/12/2023)   Humiliation, Afraid, Rape, and Kick questionnaire    Fear of Current or Ex-Partner: No    Emotionally Abused: No    Physically Abused: No    Sexually Abused: No    Family History:    Family History  Problem Relation Age of Onset   Diabetes Mother    Alzheimer's disease Mother    Hypertension Mother    Hypertension Father    Heart disease Father    Hyperlipidemia Father    Kidney disease Father    Diabetes Sister    Cancer Brother        lung   Alzheimer's disease Brother    Heart disease Brother    Hypertension Brother    Diabetes Brother    Hypertension Brother    Diabetes Brother    Hyperlipidemia Son    Hypertension Son    Diabetes Son    Hypertension Daughter    Hyperlipidemia Daughter      ROS:  Please see the history of present illness.   All other ROS reviewed and negative.     Physical  Exam/Data:   Vitals:   11/06/23 0530 11/06/23 0600 11/06/23 0630 11/06/23 0700  BP: (!) 106/51 118/63 (!) 111/53 122/64  Pulse:      Resp: (!) 21 (!) 22 (!) 21 (!) 23  Temp:      TempSrc:      SpO2:      Weight:      Height:       No intake or output data in the 24 hours ending 11/06/23 0722    11/05/2023    5:50 PM 10/25/2023    1:15 PM 10/21/2023   10:54 AM  Last 3 Weights  Weight (lbs) 192 lb 192 lb 193 lb 6.4 oz  Weight (kg) 87.091 kg 87.091 kg 87.726 kg     Body mass index is 36.28 kg/m.  General:  Well nourished, well developed, in no acute distress HEENT: normal Neck: no JVD Vascular: No carotid bruits; Distal pulses 2+ bilaterally Cardiac:  normal S1, S2; RRR; no murmur  Lungs:  clear to auscultation bilaterally, no wheezing, rhonchi or rales  Abd: soft, nontender, no hepatomegaly  Ext: no edema Musculoskeletal:  No deformities, BUE and BLE strength normal and equal Skin: warm and dry  Neuro:  CNs 2-12 intact, no focal abnormalities noted Psych:  Normal affect   EKG:  The EKG was personally reviewed and demonstrates:  NSR 77bpm, 1st degree AV block, TWI III, no change Telemetry:  Telemetry was personally reviewed and demonstrates:  NSR HR 60-80  Relevant CV Studies:  Echo 05/2023 1. Left ventricular ejection fraction, by estimation, is 65 to 70%. The  left ventricle has normal function. The left ventricle has no regional  wall motion abnormalities. There is moderate concentric left ventricular  hypertrophy. Left ventricular  diastolic parameters are consistent with Grade II diastolic dysfunction  (pseudonormalization). Elevated left ventricular end-diastolic pressure.   2. Right ventricular systolic function is normal. The right ventricular  size is normal. There is moderately elevated pulmonary artery systolic  pressure.   3. The mitral valve is normal in structure. Mild mitral valve  regurgitation. No evidence of mitral stenosis.   4. The aortic valve is  tricuspid. Aortic valve regurgitation is not  visualized. No aortic stenosis is present.   5. The inferior vena cava is normal in size with greater than 50%  respiratory variability, suggesting right atrial pressure of 3 mmHg.   Cardiac cath 2022   Mid LAD lesion is 20% stenosed.   LV end diastolic pressure is mildly elevated.   1.  Mild nonobstructive coronary artery disease. 2.  Left ventricular angiography was not performed.  EF was normal by echo. 3.  Mildly elevated left ventricular end-diastolic pressure at 18 mmHg.   Recommendations: No culprit is identified for patient's symptoms and mildly elevated troponin.    Laboratory Data:  High Sensitivity Troponin:   Recent Labs  Lab 11/05/23 1802 11/05/23 2211 11/05/23 2341  TROPONINIHS 96* 130* 158*     Chemistry Recent Labs  Lab 11/05/23 1802  NA 142  K 4.2  CL 104  CO2 25  GLUCOSE 174*  BUN 43*  CREATININE 1.78*  CALCIUM 9.4  GFRNONAA 28*  ANIONGAP 13    No results for input(s): "PROT", "ALBUMIN", "AST", "ALT", "ALKPHOS", "BILITOT" in the last 168 hours. Lipids No results for input(s): "CHOL", "TRIG", "HDL", "LABVLDL", "LDLCALC", "CHOLHDL" in the last 168 hours.  Hematology Recent Labs  Lab 11/05/23 1802  WBC 12.1*  RBC 4.03  HGB 12.2  HCT 38.1  MCV 94.5  MCH 30.3  MCHC 32.0  RDW 16.7*  PLT 122*   Thyroid No results for input(s): "TSH", "FREET4" in the last 168 hours.  BNP Recent Labs  Lab 11/05/23 1802  BNP 653.6*    DDimer No results for input(s): "DDIMER" in the last 168 hours.   Radiology/Studies:  DG Chest 2 View  Result Date: 11/05/2023 CLINICAL DATA:  Chest pain, cough EXAM: CHEST - 2 VIEW COMPARISON:  10/25/2023 FINDINGS: Lungs are essentially clear.  No pleural effusion or pneumothorax. The heart is normal in size.  Thoracic aortic atherosclerosis. Visualized osseous structures are within normal limits. IMPRESSION: Normal chest radiographs. Electronically Signed   By: Charline Bills M.D.   On: 11/05/2023 19:06     Assessment and Plan:   NSTEMI Nonobstructive CAD - presented with SOB and chest pain that was worse on exertion. Reported URI symptoms as well. WBC elevated on admission. Respiratory panel negative - HS trop [phone] - EKG nonacute - continue IV heparin x 48 hours - cardiac cath in 2022 showed nonobstructive disease - check echo - no active chest pain - not a good cath candidate given CKD - may consider MPI, will discuss with MD. For now continue IV heparin x 48 hours  HFpEF - echo in 05/2023 showed LVEF 65-70%,  moderate LVH, G2DD, elevated LVEDP, normal RVSF with moderately elevated pulmonary artery systolic pressure, mild MR - BNP 653 - CXR negative - PTA losartan 50mg  daily, coreg 12.5mg  BID, torsemide 10mg  daily - mild lower leg edema on exam - I will given one dose of IV lasix 40mg   - breathing issues may be from URI  HTN - continue PTA losartan and Coreg - Bps good  Thrombocytopenia - stable at 122k  Dm2 - per IM - A1C 6.8  Ckd stage 3 - Scr 1.78, BUN 43, which is overall stable  For questions or updates, please contact [facility] Please consult [email or web address] for contact info under    Signed, Pamela Intrieri David Stall, PA-C  11/06/2023 7:22 AM

## 2023-11-06 NOTE — Assessment & Plan Note (Signed)
Complicating factor to overall prognosis and care 

## 2023-11-06 NOTE — Assessment & Plan Note (Signed)
Clinically euvolemic Continue home GDMT with carvedilol, losartan and continue torsemide

## 2023-11-06 NOTE — Assessment & Plan Note (Signed)
Continue losartan and carvedilol 

## 2023-11-06 NOTE — Assessment & Plan Note (Signed)
Patient with 1 week of cough and malaise WBC normal and respiratory viral panel negative Symptom control

## 2023-11-06 NOTE — Assessment & Plan Note (Signed)
No acute issues suspected 

## 2023-11-07 ENCOUNTER — Inpatient Hospital Stay (HOSPITAL_COMMUNITY)
Admit: 2023-11-07 | Discharge: 2023-11-07 | Disposition: A | Discharge status: Home / Self Care | Payer: PPO | Attending: Medical

## 2023-11-07 DIAGNOSIS — R079 Chest pain, unspecified: Secondary | ICD-10-CM

## 2023-11-07 DIAGNOSIS — J988 Other specified respiratory disorders: Secondary | ICD-10-CM

## 2023-11-07 DIAGNOSIS — N1832 Chronic kidney disease, stage 3b: Secondary | ICD-10-CM | POA: Diagnosis not present

## 2023-11-07 DIAGNOSIS — I5033 Acute on chronic diastolic (congestive) heart failure: Secondary | ICD-10-CM | POA: Diagnosis not present

## 2023-11-07 DIAGNOSIS — I214 Non-ST elevation (NSTEMI) myocardial infarction: Secondary | ICD-10-CM | POA: Diagnosis not present

## 2023-11-07 LAB — CBC
HCT: 31.6 % — ABNORMAL LOW (ref 36.0–46.0)
Hemoglobin: 10.6 g/dL — ABNORMAL LOW (ref 12.0–15.0)
MCH: 30.8 pg (ref 26.0–34.0)
MCHC: 33.5 g/dL (ref 30.0–36.0)
MCV: 91.9 fL (ref 80.0–100.0)
Platelets: 106 10*3/uL — ABNORMAL LOW (ref 150–400)
RBC: 3.44 MIL/uL — ABNORMAL LOW (ref 3.87–5.11)
RDW: 16.7 % — ABNORMAL HIGH (ref 11.5–15.5)
WBC: 7.6 10*3/uL (ref 4.0–10.5)
nRBC: 0 % (ref 0.0–0.2)

## 2023-11-07 LAB — HEPARIN LEVEL (UNFRACTIONATED)
Heparin Unfractionated: 0.29 [IU]/mL — ABNORMAL LOW (ref 0.30–0.70)
Heparin Unfractionated: 0.43 [IU]/mL (ref 0.30–0.70)
Heparin Unfractionated: 0.42 [IU]/mL (ref 0.30–0.70)

## 2023-11-07 LAB — BASIC METABOLIC PANEL
Anion gap: 10 (ref 5–15)
BUN: 41 mg/dL — ABNORMAL HIGH (ref 8–23)
CO2: 25 mmol/L (ref 22–32)
Calcium: 8.9 mg/dL (ref 8.9–10.3)
Chloride: 101 mmol/L (ref 98–111)
Creatinine, Ser: 1.75 mg/dL — ABNORMAL HIGH (ref 0.44–1.00)
GFR, Estimated: 29 mL/min — ABNORMAL LOW (ref >60)
Glucose, Bld: 118 mg/dL — ABNORMAL HIGH (ref 70–99)
Potassium: 3.9 mmol/L (ref 3.5–5.1)
Sodium: 136 mmol/L (ref 135–145)

## 2023-11-07 LAB — ECHOCARDIOGRAM COMPLETE
AR max vel: 1.28 cm2
AV Area VTI: 1.38 cm2
AV Area mean vel: 1.25 cm2
AV Mean grad: 4 mm[Hg]
AV Peak grad: 7.5 mm[Hg]
Ao pk vel: 1.37 m/s
Area-P 1/2: 3.83 cm2
Height: 61 in
MV VTI: 0.83 cm2
S' Lateral: 2.7 cm
Weight: 3072 [oz_av]

## 2023-11-07 LAB — GLUCOSE, CAPILLARY
Glucose-Capillary: 222 mg/dL — ABNORMAL HIGH (ref 70–99)
Glucose-Capillary: 111 mg/dL — ABNORMAL HIGH (ref 70–99)
Glucose-Capillary: 179 mg/dL — ABNORMAL HIGH (ref 70–99)

## 2023-11-07 LAB — EXPECTORATED SPUTUM ASSESSMENT W GRAM STAIN, RFLX TO RESP C

## 2023-11-07 MED ORDER — HEPARIN BOLUS VIA INFUSION
1000.0000 [IU] | Freq: Once | INTRAVENOUS | Status: AC
  Administered 2023-11-07: 1000 [IU] via INTRAVENOUS
  Filled 2023-11-07: qty 1000

## 2023-11-07 NOTE — Plan of Care (Signed)
  Problem: Education: Goal: Ability to describe self-care measures that may prevent or decrease complications (Diabetes Survival Skills Education) will improve Outcome: Progressing Goal: Individualized Educational Video(s) Outcome: Progressing   Problem: Skin Integrity: Goal: Risk for impaired skin integrity will decrease Outcome: Progressing   Problem: Tissue Perfusion: Goal: Adequacy of tissue perfusion will improve Outcome: Progressing

## 2023-11-07 NOTE — Progress Notes (Signed)
PHARMACY - ANTICOAGULATION CONSULT NOTE  Pharmacy Consult for Heparin  Indication: chest pain/ACS  Allergies  Allergen Reactions   Penicillins Swelling   Sulfa Antibiotics Swelling   Amlodipine Swelling    LE Swelling   Lisinopril Cough    Patient Measurements: Height: 5\' 1"  (154.9 cm) Weight: 87.1 kg (192 lb) IBW/kg (Calculated) : 47.8 Heparin Dosing Weight: 68 kg   Vital Signs: Temp: 98.2 F (36.8 C) (12/08 0406) Temp Source: Oral (12/08 0406) BP: 114/67 (12/08 0406) Pulse Rate: 64 (12/08 0406)  Labs: Recent Labs    11/05/23 1802 11/05/23 2211 11/05/23 2341 11/06/23 0922 11/06/23 1703 11/07/23 0607  HGB 12.2  --   --   --   --  10.6*  HCT 38.1  --   --   --   --  31.6*  PLT 122*  --   --   --   --  106*  APTT  --   --  33  --   --   --   LABPROT  --   --  15.8*  --   --   --   INR  --   --  1.2  --   --   --   HEPARINUNFRC  --   --   --  0.35 0.34 0.29*  CREATININE 1.78*  --   --   --   --  1.75*  TROPONINIHS 96* 130* 158*  --   --   --     Estimated Creatinine Clearance: 25.3 mL/min (A) (by C-G formula based on SCr of 1.75 mg/dL (H)).   Medical History: Past Medical History:  Diagnosis Date   Arthritis    Breast cancer (HCC) 2007   Right breast, s/p mastectomy   Chronic diastolic congestive heart failure (HCC)    per patient, dx by cardiology   Diabetes mellitus without complication (HCC)    GERD (gastroesophageal reflux disease)    Gout    Hyperlipidemia    Hypertension    Spinal stenosis     Medications:  Medications Prior to Admission  Medication Sig Dispense Refill Last Dose   acetaminophen (TYLENOL) 650 MG CR tablet Take 650 mg by mouth 2 (two) times daily.   11/05/2023 at 0900   albuterol (VENTOLIN HFA) 108 (90 Base) MCG/ACT inhaler INHALE 2 PUFFS BY MOUTH EVERY 6 HOURS AS NEEDED (Patient taking differently: Inhale 2 puffs into the lungs every 6 (six) hours as needed for wheezing or shortness of breath.) 8.5 each 2 prn   ascorbic acid  (VITAMIN C) 1000 MG tablet Take 1,000 mg by mouth daily.   11/04/2023   aspirin EC 81 MG tablet Take 1 tablet (81 mg total) by mouth daily. Swallow whole. 90 tablet 3 11/04/2023 at 2030   Benzocaine-Benzethonium [facility] EX) Apply 1 application  topically daily as needed. Roll-on daily as needed for knee pain.   11/05/2023   benzonatate (TESSALON) 100 MG capsule Take 1 capsule (100 mg total) by mouth 3 (three) times daily as needed for cough. 30 capsule 0 11/01/2023   brimonidine (ALPHAGAN) 0.2 % ophthalmic solution Place 1 drop into both eyes 3 (three) times daily.   11/05/2023 at 0900   carvedilol (COREG) 12.5 MG tablet Take 1 tablet (12.5 mg total) by mouth 2 (two) times daily with a meal. 180 tablet 3 11/05/2023 at 0900   Cholecalciferol (VITAMIN D-3 PO) Take 2,000 Units by mouth daily.   11/05/2023 at 0900   Cyanocobalamin (B-12 PO) Take 1 tablet  by mouth daily.   11/03/2023   Dulaglutide (TRULICITY) 1.5 MG/0.5ML SOAJ INJECT 1.5 MG (0.5ML) UNDER THE SKIN ONCE A WEEK 2 mL 2 10/29/2023   ezetimibe (ZETIA) 10 MG tablet Take 1 tablet (10 mg total) by mouth daily. 90 tablet 3 11/05/2023 at 0900   fluticasone (FLONASE) 50 MCG/ACT nasal spray SPRAY 2 SPRAYS INTO EACH NOSTRIL EVERY DAY 48 mL 1 11/05/2023   gabapentin (NEURONTIN) 300 MG capsule Take 1 capsule (300 mg total) by mouth 3 (three) times daily. 270 capsule 1 11/05/2023 at 0900   glipiZIDE (GLUCOTROL) 5 MG tablet Take 1 tablet (5 mg total) by mouth 2 (two) times daily before a meal. 180 tablet 3 11/05/2023 at 0900   ketorolac (ACULAR) 0.5 % ophthalmic solution INSTILL 1 DROP INTO RIGHT EYE 4 TIMES A DAY (Patient taking differently: Place 1 drop into the right eye 4 (four) times daily. INSTILL 1 DROP INTO RIGHT EYE 3 TIMES A DAY) 5 mL 6 11/04/2023   latanoprost (XALATAN) 0.005 % ophthalmic solution Place 1 drop into both eyes daily.   11/04/2023 at 2030   levocetirizine (XYZAL) 5 MG tablet TAKE 1 TABLET BY MOUTH EVERY DAY IN THE EVENING (Patient taking  differently: Take 5 mg by mouth every evening.) 90 tablet 1 11/04/2023 at 2030   losartan (COZAAR) 50 MG tablet TAKE 1 TABLET BY MOUTH EVERY DAY 90 tablet 3 11/05/2023 at 0900   omeprazole (PRILOSEC) 40 MG capsule TAKE 1 CAP 2 TIMES DAILY. PLEASE CALL [phone] TO SCHEDULE AN OFFICE VISIT FOR MORE REFILLS. 180 capsule 0 11/05/2023 at 0900   OneTouch Delica Lancets 33G MISC Use as instructed to check blood sugar 2-3 times a day.  DX E11.9 (Patient taking differently: 1 each by Other route See admin instructions. Use as instructed to check blood sugar 2-3 times a day.  DX E11.9) 100 each 3 11/05/2023   ONETOUCH ULTRA TEST test strip CHECK BLOOD SUGAR 2 TO 3 TIMES DAILY AS DIRECTED (Patient taking differently: 1 each by Other route as needed for other.) 100 strip 5 11/05/2023   timolol (TIMOPTIC) 0.5 % ophthalmic solution Place 1 drop into both eyes 2 (two) times a day.   11/05/2023 at 0900   torsemide (DEMADEX) 10 MG tablet TAKE 1 TABLET BY MOUTH EVERY DAY 90 tablet 1 11/05/2023 at 0900   Alcohol Swabs (ALCOHOL PREP PAD) 70 % PADS Use to check blood sugars (Patient taking differently: 1 each by Other route See admin instructions. Use to check blood sugars) 100 each 3    blood glucose meter kit and supplies KIT Dispense based on patient and insurance preference. Use up to four times daily as directed. (FOR ICD-9 250.00, 250.01). (Patient taking differently: Inject 1 each into the skin as directed. Dispense based on patient and insurance preference. Use up to four times daily as directed. (FOR ICD-9 250.00, 250.01).) 1 each 11    clindamycin (CLEOCIN) 150 MG capsule Take 1 capsule (150 mg total) by mouth 3 (three) times daily. (Patient not taking: Reported on 11/06/2023) 15 capsule 0 Not Taking   FLUZONE HIGH-DOSE 0.5 ML injection       Assessment: Pharmacy consulted to dose heparin in this 82 year old female with ACS/NSTEMI.  No prior anticoag noted. CrCl = 24.8 ml/min  12/7 0922 HL 0.35,  therapeutic  x1 12/7 1703 HL 0.34, therapeutic x2 12/8 0607 HL 0.29, SUBtherapeutic   Goal of Therapy:  Heparin level 0.3-0.7 units/ml Monitor platelets by anticoagulation protocol: Yes  Plan:  12/8: HL @ 0607 = 0.29, SUBtherapeutic  - Will order heparin 1000 units IV X 1 bolus and increase drip rate to 900 units/hr - Will recheck HL 8 hrs after rate change  Continue to monitor H&H and platelets  Thank you for involving pharmacy in this patient's care.   Antonious Omahoney D Clinical Pharmacist 11/07/2023 6:47 AM

## 2023-11-07 NOTE — Progress Notes (Signed)
Rounding Note    Patient Name: Donna Fuller Date of Encounter: 11/07/2023  [facility] Cardiologist: Julien Nordmann, MD   Subjective   Feels that breathing is gradually improving. Coughing less, though worse when she lays down. Chest pain is improved, central tightness that is worse with cough.  Inpatient Medications    Scheduled Meds:  aspirin EC  81 mg Oral Daily   brimonidine  1 drop Both Eyes TID   carvedilol  12.5 mg Oral BID WC   ezetimibe  10 mg Oral Daily   gabapentin  300 mg Oral TID   insulin aspart  0-15 Units Subcutaneous TID WC   insulin aspart  0-5 Units Subcutaneous QHS   latanoprost  1 drop Both Eyes Daily   pantoprazole  40 mg Oral Daily   Continuous Infusions:  heparin 900 Units/hr (11/07/23 0647)   PRN Meds: acetaminophen, albuterol, guaiFENesin, nitroGLYCERIN, ondansetron (ZOFRAN) IV   Vital Signs    Vitals:   11/07/23 0007 11/07/23 0406 11/07/23 0755 11/07/23 1153  BP: 104/65 114/67 120/61 (!) 111/50  Pulse: 62 64 67 66  Resp: 18 18 19 18   Temp: 97.7 F (36.5 C) 98.2 F (36.8 C) 98.3 F (36.8 C) (!) 97.4 F (36.3 C)  TempSrc:  Oral    SpO2: 97% 100% 98% 97%  Weight:      Height:        Intake/Output Summary (Last 24 hours) at 11/07/2023 1252 Last data filed at 11/07/2023 1200 Gross per 24 hour  Intake 404.02 ml  Output --  Net 404.02 ml      11/05/2023    5:50 PM 10/25/2023    1:15 PM 10/21/2023   10:54 AM  Last 3 Weights  Weight (lbs) 192 lb 192 lb 193 lb 6.4 oz  Weight (kg) 87.091 kg 87.091 kg 87.726 kg      Telemetry    SR - Personally Reviewed  Physical Exam   GEN: No acute distress.   Neck: No JVD Cardiac: RRR, no  rubs, or gallops. 1/6 systolic Respiratory: Clearer today, some rhonchi that improve with cough GI: Soft, nontender, non-distended  MS: minimal LE edema; No deformity. Neuro:  Nonfocal  Psych: Normal affect   New pertinent results (labs, ECG, imaging, cardiac studies)    Echo  pending  Patient Profile     82 y.o. female with PMH chronic diastolic heart failure, chronic kidney disease stage 3b who presented with shortness of breath and chest pain. Notes URI symptoms for several days, as well as worsening shortness of breath with exertion and worsening LE edema.   Assessment & Plan    NSTEMI vs demand ischemia -rising hsTn concerning for NSTEMI, though prior cath had nonobstructive CAD -hsTn 130 -> 158, baseline values around 80-90 -agree with heparin x 48 hours. Monitor Hgb, see below -echo will help to determine medical management vs. Cath. If there are not significant focal wall motion abnormalities, would favor medical management given her chronic kidney disease stage 3b -could consider stress test (PET vs nuclear) if further decision making needed -she would also like to avoid cath if possible   Acute on chronic diastolic heart failure -BNP elevated -urine not well charted. Reported that she did not make significant urine on 40 mg lasix dose, has improved output on 80 mg IV lasix -Cr stable   ?URI -coarse breath sounds, productive cough -per primary team  Drop in Hgb -from 12.2 to 10.6 today, no gross bleeding noted -monitor as  she is on heparin    Signed, Jodelle Red, MD  11/07/2023, 12:52 PM

## 2023-11-07 NOTE — Progress Notes (Signed)
PHARMACY - ANTICOAGULATION CONSULT NOTE  Pharmacy Consult for Heparin  Indication: chest pain/ACS  Allergies  Allergen Reactions   Penicillins Swelling   Sulfa Antibiotics Swelling   Amlodipine Swelling    LE Swelling   Lisinopril Cough    Patient Measurements: Height: 5\' 1"  (154.9 cm) Weight: 87.1 kg (192 lb) IBW/kg (Calculated) : 47.8 Heparin Dosing Weight: 68 kg   Vital Signs: Temp: 97.8 F (36.6 C) (12/08 1557) BP: 100/53 (12/08 1557) Pulse Rate: 62 (12/08 1557)  Labs: Recent Labs    11/05/23 1802 11/05/23 2211 11/05/23 2341 11/06/23 0922 11/06/23 1703 11/07/23 0607 11/07/23 1502  HGB 12.2  --   --   --   --  10.6*  --   HCT 38.1  --   --   --   --  31.6*  --   PLT 122*  --   --   --   --  106*  --   APTT  --   --  33  --   --   --   --   LABPROT  --   --  15.8*  --   --   --   --   INR  --   --  1.2  --   --   --   --   HEPARINUNFRC  --   --   --    < > 0.34 0.29* 0.43  CREATININE 1.78*  --   --   --   --  1.75*  --   TROPONINIHS 96* 130* 158*  --   --   --   --    < > = values in this interval not displayed.    Estimated Creatinine Clearance: 25.3 mL/min (A) (by C-G formula based on SCr of 1.75 mg/dL (H)).   Medical History: Past Medical History:  Diagnosis Date   Arthritis    Breast cancer (HCC) 2007   Right breast, s/p mastectomy   Chronic diastolic congestive heart failure (HCC)    per patient, dx by cardiology   Diabetes mellitus without complication (HCC)    GERD (gastroesophageal reflux disease)    Gout    Hyperlipidemia    Hypertension    Spinal stenosis     Medications:  Medications Prior to Admission  Medication Sig Dispense Refill Last Dose   acetaminophen (TYLENOL) 650 MG CR tablet Take 650 mg by mouth 2 (two) times daily.   11/05/2023 at 0900   albuterol (VENTOLIN HFA) 108 (90 Base) MCG/ACT inhaler INHALE 2 PUFFS BY MOUTH EVERY 6 HOURS AS NEEDED (Patient taking differently: Inhale 2 puffs into the lungs every 6 (six) hours as  needed for wheezing or shortness of breath.) 8.5 each 2 prn   ascorbic acid (VITAMIN C) 1000 MG tablet Take 1,000 mg by mouth daily.   11/04/2023   aspirin EC 81 MG tablet Take 1 tablet (81 mg total) by mouth daily. Swallow whole. 90 tablet 3 11/04/2023 at 2030   Benzocaine-Benzethonium [facility] EX) Apply 1 application  topically daily as needed. Roll-on daily as needed for knee pain.   11/05/2023   benzonatate (TESSALON) 100 MG capsule Take 1 capsule (100 mg total) by mouth 3 (three) times daily as needed for cough. 30 capsule 0 11/01/2023   brimonidine (ALPHAGAN) 0.2 % ophthalmic solution Place 1 drop into both eyes 3 (three) times daily.   11/05/2023 at 0900   carvedilol (COREG) 12.5 MG tablet Take 1 tablet (12.5 mg total) by mouth 2 (two)  times daily with a meal. 180 tablet 3 11/05/2023 at 0900   Cholecalciferol (VITAMIN D-3 PO) Take 2,000 Units by mouth daily.   11/05/2023 at 0900   Cyanocobalamin (B-12 PO) Take 1 tablet by mouth daily.   11/03/2023   Dulaglutide (TRULICITY) 1.5 MG/0.5ML SOAJ INJECT 1.5 MG (0.5ML) UNDER THE SKIN ONCE A WEEK 2 mL 2 10/29/2023   ezetimibe (ZETIA) 10 MG tablet Take 1 tablet (10 mg total) by mouth daily. 90 tablet 3 11/05/2023 at 0900   fluticasone (FLONASE) 50 MCG/ACT nasal spray SPRAY 2 SPRAYS INTO EACH NOSTRIL EVERY DAY 48 mL 1 11/05/2023   gabapentin (NEURONTIN) 300 MG capsule Take 1 capsule (300 mg total) by mouth 3 (three) times daily. 270 capsule 1 11/05/2023 at 0900   glipiZIDE (GLUCOTROL) 5 MG tablet Take 1 tablet (5 mg total) by mouth 2 (two) times daily before a meal. 180 tablet 3 11/05/2023 at 0900   ketorolac (ACULAR) 0.5 % ophthalmic solution INSTILL 1 DROP INTO RIGHT EYE 4 TIMES A DAY (Patient taking differently: Place 1 drop into the right eye 4 (four) times daily. INSTILL 1 DROP INTO RIGHT EYE 3 TIMES A DAY) 5 mL 6 11/04/2023   latanoprost (XALATAN) 0.005 % ophthalmic solution Place 1 drop into both eyes daily.   11/04/2023 at 2030   levocetirizine (XYZAL) 5 MG  tablet TAKE 1 TABLET BY MOUTH EVERY DAY IN THE EVENING (Patient taking differently: Take 5 mg by mouth every evening.) 90 tablet 1 11/04/2023 at 2030   losartan (COZAAR) 50 MG tablet TAKE 1 TABLET BY MOUTH EVERY DAY 90 tablet 3 11/05/2023 at 0900   omeprazole (PRILOSEC) 40 MG capsule TAKE 1 CAP 2 TIMES DAILY. PLEASE CALL [phone] TO SCHEDULE AN OFFICE VISIT FOR MORE REFILLS. 180 capsule 0 11/05/2023 at 0900   OneTouch Delica Lancets 33G MISC Use as instructed to check blood sugar 2-3 times a day.  DX E11.9 (Patient taking differently: 1 each by Other route See admin instructions. Use as instructed to check blood sugar 2-3 times a day.  DX E11.9) 100 each 3 11/05/2023   ONETOUCH ULTRA TEST test strip CHECK BLOOD SUGAR 2 TO 3 TIMES DAILY AS DIRECTED (Patient taking differently: 1 each by Other route as needed for other.) 100 strip 5 11/05/2023   timolol (TIMOPTIC) 0.5 % ophthalmic solution Place 1 drop into both eyes 2 (two) times a day.   11/05/2023 at 0900   torsemide (DEMADEX) 10 MG tablet TAKE 1 TABLET BY MOUTH EVERY DAY 90 tablet 1 11/05/2023 at 0900   Alcohol Swabs (ALCOHOL PREP PAD) 70 % PADS Use to check blood sugars (Patient taking differently: 1 each by Other route See admin instructions. Use to check blood sugars) 100 each 3    blood glucose meter kit and supplies KIT Dispense based on patient and insurance preference. Use up to four times daily as directed. (FOR ICD-9 250.00, 250.01). (Patient taking differently: Inject 1 each into the skin as directed. Dispense based on patient and insurance preference. Use up to four times daily as directed. (FOR ICD-9 250.00, 250.01).) 1 each 11    clindamycin (CLEOCIN) 150 MG capsule Take 1 capsule (150 mg total) by mouth 3 (three) times daily. (Patient not taking: Reported on 11/06/2023) 15 capsule 0 Not Taking   FLUZONE HIGH-DOSE 0.5 ML injection       Assessment: Pharmacy consulted to dose heparin in this 82 year old female with ACS/NSTEMI.  No prior  anticoag noted. CrCl = 24.8 ml/min  12/7 0922 HL 0.35,  therapeutic x1 12/7 1703 HL 0.34, therapeutic x2 12/8 0607 HL 0.29, SUBtherapeutic  12/8 1502 HL 0.43, therapeutic x1  Goal of Therapy:  Heparin level 0.3-0.7 units/ml Monitor platelets by anticoagulation protocol: Yes   Plan:  Continue heparin infusion at 900 units/hour Check confirmatory heparin level in 8 hours Monitor daily heparin levels while on heparin infusion Monitor CBC and signs/symptoms of bleeding  Thank you for involving pharmacy in this patient's care.   Rockwell Alexandria, PharmD Clinical Pharmacist 11/07/2023 4:19 PM

## 2023-11-07 NOTE — Plan of Care (Signed)
Patient without distress. No complaints of chest pain voiced. Husband at bedside. Will continue to monitor.   Problem: Education: Goal: Ability to describe self-care measures that may prevent or decrease complications (Diabetes Survival Skills Education) will improve Outcome: Progressing Goal: Individualized Educational Video(s) Outcome: Progressing   Problem: Coping: Goal: Ability to adjust to condition or change in health will improve Outcome: Progressing   Problem: Fluid Volume: Goal: Ability to maintain a balanced intake and output will improve Outcome: Progressing   Problem: Health Behavior/Discharge Planning: Goal: Ability to identify and utilize available resources and services will improve Outcome: Progressing Goal: Ability to manage health-related needs will improve Outcome: Progressing   Problem: Metabolic: Goal: Ability to maintain appropriate glucose levels will improve Outcome: Progressing   Problem: Nutritional: Goal: Maintenance of adequate nutrition will improve Outcome: Progressing Goal: Progress toward achieving an optimal weight will improve Outcome: Progressing   Problem: Skin Integrity: Goal: Risk for impaired skin integrity will decrease Outcome: Progressing   Problem: Tissue Perfusion: Goal: Adequacy of tissue perfusion will improve Outcome: Progressing   Problem: Education: Goal: Understanding of cardiac disease, CV risk reduction, and recovery process will improve Outcome: Progressing Goal: Individualized Educational Video(s) Outcome: Progressing   Problem: Activity: Goal: Ability to tolerate increased activity will improve Outcome: Progressing   Problem: Cardiac: Goal: Ability to achieve and maintain adequate cardiovascular perfusion will improve Outcome: Progressing   Problem: Health Behavior/Discharge Planning: Goal: Ability to safely manage health-related needs after discharge will improve Outcome: Progressing   Problem:  Education: Goal: Knowledge of General Education information will improve Description: Including pain rating scale, medication(s)/side effects and non-pharmacologic comfort measures Outcome: Progressing   Problem: Health Behavior/Discharge Planning: Goal: Ability to manage health-related needs will improve Outcome: Progressing   Problem: Clinical Measurements: Goal: Ability to maintain clinical measurements within normal limits will improve Outcome: Progressing Goal: Will remain free from infection Outcome: Progressing Goal: Diagnostic test results will improve Outcome: Progressing Goal: Respiratory complications will improve Outcome: Progressing Goal: Cardiovascular complication will be avoided Outcome: Progressing   Problem: Activity: Goal: Risk for activity intolerance will decrease Outcome: Progressing   Problem: Nutrition: Goal: Adequate nutrition will be maintained Outcome: Progressing   Problem: Coping: Goal: Level of anxiety will decrease Outcome: Progressing   Problem: Elimination: Goal: Will not experience complications related to bowel motility Outcome: Progressing Goal: Will not experience complications related to urinary retention Outcome: Progressing   Problem: Pain Management: Goal: General experience of comfort will improve Outcome: Progressing   Problem: Safety: Goal: Ability to remain free from injury will improve Outcome: Progressing   Problem: Skin Integrity: Goal: Risk for impaired skin integrity will decrease Outcome: Progressing

## 2023-11-07 NOTE — Progress Notes (Signed)
  Echocardiogram 2D Echocardiogram has been performed.  Donna Fuller 11/07/2023, 12:55 PM

## 2023-11-07 NOTE — Progress Notes (Signed)
PROGRESS NOTE    Donna Fuller  ZOX:[id number] DOB: Oct 04, 1941 DOA: 11/05/2023 PCP: Esperanza Richters, PA-C  Outpatient Specialists: cardiology, endo, nephrology    Brief Narrative:   From admission h and p Donna Fuller is a 82 y.o. female with medical history significant for DM, diastolic CHF (EF 65 to 70%, G2 DD 05/2023), CKD 3b, HTN,  nonobstructive CAD on cath 2022(20% LAD stenosis), last hospitalized from 7/12 to 06/13/2023 for CHF exacerbation and chest pain with flat troponin, who is being admitted for possible NSTEMI. Patient developed chest pain and shortness of breath on the day of arrival however this was preceded by a week of minimally productive cough and generalized malaise.  She denied fever or chills, lower extremity pain or swelling. Patient was last evaluated by her cardiologist on 10/06/2023 and was found to be stable. She is chest pain-free on arrival  Assessment & Plan:   Principal Problem:   NSTEMI (non-ST elevated myocardial infarction) [facility]) Active Problems:   CAD (coronary artery disease) 20% narrowing of LAD based on cardiac catheterization from 2022   (HFpEF) heart failure with preserved ejection fraction (HCC)   Respiratory tract infection   Hypertension   Spinal stenosis   History of breast cancer   Stage 3b chronic kidney disease (HCC)   Type 2 diabetes mellitus with hyperglycemia, without long-term current use of insulin (HCC)   DDD (degenerative disc disease), lumbar   Ductal carcinoma in situ (DCIS) of right breast   Anemia in chronic kidney disease   Thrombocytopenia (HCC)   Obesity, Class III, BMI 40-49.9 (morbid obesity) (HCC)   Rheumatoid arthritis (HCC)  # HFpEF with exacerbation Orthopnea, dyspnea, bnp elevated, cxr clear - IV diuresis dosing per cardiology - TTE ordered, result pending - home coreg  # CAD # NSTEMI Non-obstructive CAD seen on 2022 cath. Here with intermittent non-exertional chest pain, trop has risen to 158. S/p  aspirin, started on heparin - heparin per cardiology, advises 48 hours will be up tonight - further w/u per cardiology, will depend on TTE results  # Dyspnea Likely 2/2 above processes. Cxr clear, covid/flu/rsv neg, but patient does reports productive cough. RVP neg - sputum for culture - holding on abx for now, consider repeat cxr if symptoms persist despite treating cardiac conditions  # T2DM Euglycemic - SSI for now  # CKD 3b Gfr upper 20s which is a bit off from baseline - hold home losart while diuresing  # Debility - PT consult  # Obesity Noted   DVT prophylaxis: IV heparin Code Status: dnr Family Communication: husband updated @ bedside 12/8    Level of care: Progressive Status is: Inpatient Remains inpatient appropriate because: need for IV therapeutics    Consultants:  cardiology  Procedures: None thus far  Antimicrobials:  none    Subjective: No current chest pain. Reports ongoing mild cough  Objective: Vitals:   11/07/23 0007 11/07/23 0406 11/07/23 0755 11/07/23 1153  BP: 104/65 114/67 120/61 (!) 111/50  Pulse: 62 64 67 66  Resp: 18 18 19 18   Temp: 97.7 F (36.5 C) 98.2 F (36.8 C) 98.3 F (36.8 C) (!) 97.4 F (36.3 C)  TempSrc:  Oral    SpO2: 97% 100% 98% 97%  Weight:      Height:        Intake/Output Summary (Last 24 hours) at 11/07/2023 1459 Last data filed at 11/07/2023 1200 Gross per 24 hour  Intake 404.02 ml  Output --  Net 404.02 ml  Filed Weights   11/05/23 1750  Weight: 87.1 kg    Examination:  General exam: Appears calm and comfortable  Respiratory system: rales at bases otherwise clear Cardiovascular system: S1 & S2 heard, RRR. Soft systolic murmur Gastrointestinal system: Abdomen is nondistended, obese, soft and nontender.   Central nervous system: Alert and oriented. No focal neurological deficits. Extremities: Symmetric 5 x 5 power. Pitting edema to thighs Skin: No rashes, lesions or ulcers Psychiatry:  Judgement and insight appear normal. Mood & affect appropriate.     Data Reviewed: I have personally reviewed following labs and imaging studies  CBC: Recent Labs  Lab 11/05/23 1802 11/07/23 0607  WBC 12.1* 7.6  HGB 12.2 10.6*  HCT 38.1 31.6*  MCV 94.5 91.9  PLT 122* 106*   Basic Metabolic Panel: Recent Labs  Lab 11/05/23 1802 11/07/23 0607  NA 142 136  K 4.2 3.9  CL 104 101  CO2 25 25  GLUCOSE 174* 118*  BUN 43* 41*  CREATININE 1.78* 1.75*  CALCIUM 9.4 8.9   GFR: Estimated Creatinine Clearance: 25.3 mL/min (A) (by C-G formula based on SCr of 1.75 mg/dL (H)). Liver Function Tests: No results for input(s): "AST", "ALT", "ALKPHOS", "BILITOT", "PROT", "ALBUMIN" in the last 168 hours. No results for input(s): "LIPASE", "AMYLASE" in the last 168 hours. No results for input(s): "AMMONIA" in the last 168 hours. Coagulation Profile: Recent Labs  Lab 11/05/23 2341  INR 1.2   Cardiac Enzymes: No results for input(s): "CKTOTAL", "CKMB", "CKMBINDEX", "TROPONINI" in the last 168 hours. BNP (last 3 results) Recent Labs    08/25/23 1123 09/14/23 1455 10/25/23 1412  PROBNP 3,467* 531.0* 779.0*   HbA1C: No results for input(s): "HGBA1C" in the last 72 hours. CBG: Recent Labs  Lab 11/06/23 1142 11/06/23 1728 11/06/23 1829 11/06/23 2130 11/07/23 1155  GLUCAP 155* 39* 94 145* 222*   Lipid Profile: No results for input(s): "CHOL", "HDL", "LDLCALC", "TRIG", "CHOLHDL", "LDLDIRECT" in the last 72 hours. Thyroid Function Tests: No results for input(s): "TSH", "T4TOTAL", "FREET4", "T3FREE", "THYROIDAB" in the last 72 hours. Anemia Panel: No results for input(s): "VITAMINB12", "FOLATE", "FERRITIN", "TIBC", "IRON", "RETICCTPCT" in the last 72 hours. Urine analysis:    Component Value Date/Time   COLORURINE YELLOW 05/31/2023 1743   APPEARANCEUR CLEAR 05/31/2023 1743   LABSPEC 1.015 05/31/2023 1743   PHURINE 6.0 05/31/2023 1743   GLUCOSEU NEGATIVE 05/31/2023 1743    HGBUR NEGATIVE 05/31/2023 1743   BILIRUBINUR NEGATIVE 05/31/2023 1743   BILIRUBINUR Negative 04/05/2023 1616   KETONESUR NEGATIVE 05/31/2023 1743   PROTEINUR NEGATIVE 05/31/2023 1743   UROBILINOGEN 0.2 04/05/2023 1616   UROBILINOGEN 0.2 08/22/2022 1432   NITRITE NEGATIVE 05/31/2023 1743   LEUKOCYTESUR NEGATIVE 05/31/2023 1743   Sepsis Labs: @LABRCNTIP (procalcitonin:4,lacticidven:4)  ) Recent Results (from the past 240 hour(s))  Resp panel by RT-PCR (RSV, Flu A&B, Covid) Anterior Nasal Swab     Status: None   Collection Time: 11/05/23  6:02 PM   Specimen: Anterior Nasal Swab  Result Value Ref Range Status   SARS Coronavirus 2 by RT PCR NEGATIVE NEGATIVE Final    Comment: (NOTE) SARS-CoV-2 target nucleic acids are NOT DETECTED.  The SARS-CoV-2 RNA is generally detectable in upper respiratory specimens during the acute phase of infection. The lowest concentration of SARS-CoV-2 viral copies this assay can detect is 138 copies/mL. A negative result does not preclude SARS-Cov-2 infection and should not be used as the sole basis for treatment or other patient management decisions. A negative result may occur with  improper specimen collection/handling, submission of specimen other than nasopharyngeal swab, presence of viral mutation(s) within the areas targeted by this assay, and inadequate number of viral copies(<138 copies/mL). A negative result must be combined with clinical observations, patient history, and epidemiological information. The expected result is Negative.  Fact Sheet for Patients:  BloggerCourse.com  Fact Sheet for Healthcare Providers:  SeriousBroker.it  This test is no t yet approved or cleared by the [place] FDA and  has been authorized for detection and/or diagnosis of SARS-CoV-2 by FDA under an Emergency Use Authorization (EUA). This EUA will remain  in effect (meaning this test can be used) for the  duration of the COVID-19 declaration under Section 564(b)(1) of the Act, 21 U.S.C.section 360bbb-3(b)(1), unless the authorization is terminated  or revoked sooner.       Influenza A by PCR NEGATIVE NEGATIVE Final   Influenza B by PCR NEGATIVE NEGATIVE Final    Comment: (NOTE) The Xpert Xpress SARS-CoV-2/FLU/RSV plus assay is intended as an aid in the diagnosis of influenza from Nasopharyngeal swab specimens and should not be used as a sole basis for treatment. Nasal washings and aspirates are unacceptable for Xpert Xpress SARS-CoV-2/FLU/RSV testing.  Fact Sheet for Patients: BloggerCourse.com  Fact Sheet for Healthcare Providers: SeriousBroker.it  This test is not yet approved or cleared by the [place] FDA and has been authorized for detection and/or diagnosis of SARS-CoV-2 by FDA under an Emergency Use Authorization (EUA). This EUA will remain in effect (meaning this test can be used) for the duration of the COVID-19 declaration under Section 564(b)(1) of the Act, 21 U.S.C. section 360bbb-3(b)(1), unless the authorization is terminated or revoked.     Resp Syncytial Virus by PCR NEGATIVE NEGATIVE Final    Comment: (NOTE) Fact Sheet for Patients: BloggerCourse.com  Fact Sheet for Healthcare Providers: SeriousBroker.it  This test is not yet approved or cleared by the [place] FDA and has been authorized for detection and/or diagnosis of SARS-CoV-2 by FDA under an Emergency Use Authorization (EUA). This EUA will remain in effect (meaning this test can be used) for the duration of the COVID-19 declaration under Section 564(b)(1) of the Act, 21 U.S.C. section 360bbb-3(b)(1), unless the authorization is terminated or revoked.  Performed at [facility], [address] [place], [place] [address]   Respiratory (~20 pathogens) panel by PCR     Status: None    Collection Time: 11/06/23 10:18 AM   Specimen: Nasopharyngeal Swab; Respiratory  Result Value Ref Range Status   Adenovirus NOT DETECTED NOT DETECTED Final   Coronavirus 229E NOT DETECTED NOT DETECTED Final    Comment: (NOTE) The Coronavirus on the Respiratory Panel, DOES NOT test for the novel  Coronavirus (2019 nCoV)    Coronavirus HKU1 NOT DETECTED NOT DETECTED Final   Coronavirus NL63 NOT DETECTED NOT DETECTED Final   Coronavirus OC43 NOT DETECTED NOT DETECTED Final   Metapneumovirus NOT DETECTED NOT DETECTED Final   Rhinovirus / Enterovirus NOT DETECTED NOT DETECTED Final   Influenza A NOT DETECTED NOT DETECTED Final   Influenza B NOT DETECTED NOT DETECTED Final   Parainfluenza Virus 1 NOT DETECTED NOT DETECTED Final   Parainfluenza Virus 2 NOT DETECTED NOT DETECTED Final   Parainfluenza Virus 3 NOT DETECTED NOT DETECTED Final   Parainfluenza Virus 4 NOT DETECTED NOT DETECTED Final   Respiratory Syncytial Virus NOT DETECTED NOT DETECTED Final   Bordetella pertussis NOT DETECTED NOT DETECTED Final   Bordetella Parapertussis NOT DETECTED NOT DETECTED Final  Chlamydophila pneumoniae NOT DETECTED NOT DETECTED Final   Mycoplasma pneumoniae NOT DETECTED NOT DETECTED Final    Comment: Performed at [facility], [address] [place], [place] [address]  Expectorated Sputum Assessment w Gram Stain, Rflx to Resp Cult     Status: None   Collection Time: 11/07/23  6:40 AM   Specimen: Sputum  Result Value Ref Range Status   Specimen Description SPUTUM  Final   Special Requests NONE  Final   Sputum evaluation   Final    THIS SPECIMEN IS ACCEPTABLE FOR SPUTUM CULTURE Performed at [facility], [address], [place], [place] [address]    Report Status 11/07/2023 FINAL  Final         Radiology Studies: DG Chest 2 View  Result Date: 11/05/2023 CLINICAL DATA:  Chest pain, cough EXAM: CHEST - 2 VIEW COMPARISON:  10/25/2023 FINDINGS: Lungs are essentially clear.   No pleural effusion or pneumothorax. The heart is normal in size.  Thoracic aortic atherosclerosis. Visualized osseous structures are within normal limits. IMPRESSION: Normal chest radiographs. Electronically Signed   By: Charline Bills M.D.   On: 11/05/2023 19:06        Scheduled Meds:  aspirin EC  81 mg Oral Daily   brimonidine  1 drop Both Eyes TID   carvedilol  12.5 mg Oral BID WC   ezetimibe  10 mg Oral Daily   gabapentin  300 mg Oral TID   insulin aspart  0-15 Units Subcutaneous TID WC   insulin aspart  0-5 Units Subcutaneous QHS   latanoprost  1 drop Both Eyes Daily   pantoprazole  40 mg Oral Daily   Continuous Infusions:  heparin 900 Units/hr (11/07/23 0647)     LOS: 1 day     Silvano Bilis, MD [facility]   If 7PM-7AM, please contact night-coverage [email or web address] Password [facility] 11/07/2023, 2:59 PM

## 2023-11-08 ENCOUNTER — Other Ambulatory Visit (HOSPITAL_COMMUNITY): Payer: Self-pay

## 2023-11-08 ENCOUNTER — Encounter: Payer: Self-pay | Admitting: Internal Medicine

## 2023-11-08 DIAGNOSIS — I5033 Acute on chronic diastolic (congestive) heart failure: Secondary | ICD-10-CM | POA: Diagnosis not present

## 2023-11-08 DIAGNOSIS — I2489 Other forms of acute ischemic heart disease: Secondary | ICD-10-CM | POA: Diagnosis not present

## 2023-11-08 DIAGNOSIS — I214 Non-ST elevation (NSTEMI) myocardial infarction: Secondary | ICD-10-CM | POA: Diagnosis not present

## 2023-11-08 DIAGNOSIS — I2510 Atherosclerotic heart disease of native coronary artery without angina pectoris: Secondary | ICD-10-CM

## 2023-11-08 DIAGNOSIS — N1832 Chronic kidney disease, stage 3b: Secondary | ICD-10-CM | POA: Diagnosis not present

## 2023-11-08 LAB — CBC
HCT: 32.4 % — ABNORMAL LOW (ref 36.0–46.0)
Hemoglobin: 10.7 g/dL — ABNORMAL LOW (ref 12.0–15.0)
MCH: 30 pg (ref 26.0–34.0)
MCHC: 33 g/dL (ref 30.0–36.0)
MCV: 90.8 fL (ref 80.0–100.0)
Platelets: 123 10*3/uL — ABNORMAL LOW (ref 150–400)
RBC: 3.57 MIL/uL — ABNORMAL LOW (ref 3.87–5.11)
RDW: 16.4 % — ABNORMAL HIGH (ref 11.5–15.5)
WBC: 6.3 10*3/uL (ref 4.0–10.5)
nRBC: 0 % (ref 0.0–0.2)

## 2023-11-08 LAB — BASIC METABOLIC PANEL
Anion gap: 14 (ref 5–15)
BUN: 42 mg/dL — ABNORMAL HIGH (ref 8–23)
CO2: 20 mmol/L — ABNORMAL LOW (ref 22–32)
Calcium: 9 mg/dL (ref 8.9–10.3)
Chloride: 104 mmol/L (ref 98–111)
Creatinine, Ser: 1.64 mg/dL — ABNORMAL HIGH (ref 0.44–1.00)
GFR, Estimated: 31 mL/min — ABNORMAL LOW (ref >60)
Glucose, Bld: 132 mg/dL — ABNORMAL HIGH (ref 70–99)
Potassium: 3.9 mmol/L (ref 3.5–5.1)
Sodium: 138 mmol/L (ref 135–145)

## 2023-11-08 LAB — LIPOPROTEIN A (LPA): Lipoprotein (a): 38.4 nmol/L — ABNORMAL HIGH (ref <75.0)

## 2023-11-08 LAB — GLUCOSE, CAPILLARY
Glucose-Capillary: 128 mg/dL — ABNORMAL HIGH (ref 70–99)
Glucose-Capillary: 156 mg/dL — ABNORMAL HIGH (ref 70–99)

## 2023-11-08 LAB — HEPARIN LEVEL (UNFRACTIONATED): Heparin Unfractionated: 0.37 [IU]/mL (ref 0.30–0.70)

## 2023-11-08 LAB — D-DIMER, QUANTITATIVE: D-Dimer, Quant: 0.43 ug{FEU}/mL (ref 0.00–0.50)

## 2023-11-08 NOTE — Progress Notes (Signed)
PHARMACY - ANTICOAGULATION CONSULT NOTE  Pharmacy Consult for Heparin  Indication: chest pain/ACS  Allergies  Allergen Reactions   Penicillins Swelling   Sulfa Antibiotics Swelling   Amlodipine Swelling    LE Swelling   Lisinopril Cough    Patient Measurements: Height: 5\' 1"  (154.9 cm) Weight: 87.1 kg (192 lb) IBW/kg (Calculated) : 47.8 Heparin Dosing Weight: 68 kg   Vital Signs: Temp: 98.3 F (36.8 C) (12/08 2339) Temp Source: Oral (12/08 2339) BP: 111/59 (12/08 2339) Pulse Rate: 60 (12/08 2339)  Labs: Recent Labs    11/05/23 1802 11/05/23 2211 11/05/23 2341 11/06/23 0922 11/07/23 0607 11/07/23 1502 11/07/23 2300  HGB 12.2  --   --   --  10.6*  --   --   HCT 38.1  --   --   --  31.6*  --   --   PLT 122*  --   --   --  106*  --   --   APTT  --   --  33  --   --   --   --   LABPROT  --   --  15.8*  --   --   --   --   INR  --   --  1.2  --   --   --   --   HEPARINUNFRC  --   --   --    < > 0.29* 0.43 0.42  CREATININE 1.78*  --   --   --  1.75*  --   --   TROPONINIHS 96* 130* 158*  --   --   --   --    < > = values in this interval not displayed.    Estimated Creatinine Clearance: 25.3 mL/min (A) (by C-G formula based on SCr of 1.75 mg/dL (H)).   Medical History: Past Medical History:  Diagnosis Date   Arthritis    Breast cancer (HCC) 2007   Right breast, s/p mastectomy   Chronic diastolic congestive heart failure (HCC)    per patient, dx by cardiology   Diabetes mellitus without complication (HCC)    GERD (gastroesophageal reflux disease)    Gout    Hyperlipidemia    Hypertension    Spinal stenosis     Medications:  Medications Prior to Admission  Medication Sig Dispense Refill Last Dose   acetaminophen (TYLENOL) 650 MG CR tablet Take 650 mg by mouth 2 (two) times daily.   11/05/2023 at 0900   albuterol (VENTOLIN HFA) 108 (90 Base) MCG/ACT inhaler INHALE 2 PUFFS BY MOUTH EVERY 6 HOURS AS NEEDED (Patient taking differently: Inhale 2 puffs into the  lungs every 6 (six) hours as needed for wheezing or shortness of breath.) 8.5 each 2 prn   ascorbic acid (VITAMIN C) 1000 MG tablet Take 1,000 mg by mouth daily.   11/04/2023   aspirin EC 81 MG tablet Take 1 tablet (81 mg total) by mouth daily. Swallow whole. 90 tablet 3 11/04/2023 at 2030   Benzocaine-Benzethonium [facility] EX) Apply 1 application  topically daily as needed. Roll-on daily as needed for knee pain.   11/05/2023   benzonatate (TESSALON) 100 MG capsule Take 1 capsule (100 mg total) by mouth 3 (three) times daily as needed for cough. 30 capsule 0 11/01/2023   brimonidine (ALPHAGAN) 0.2 % ophthalmic solution Place 1 drop into both eyes 3 (three) times daily.   11/05/2023 at 0900   carvedilol (COREG) 12.5 MG tablet Take 1 tablet (12.5 mg  total) by mouth 2 (two) times daily with a meal. 180 tablet 3 11/05/2023 at 0900   Cholecalciferol (VITAMIN D-3 PO) Take 2,000 Units by mouth daily.   11/05/2023 at 0900   Cyanocobalamin (B-12 PO) Take 1 tablet by mouth daily.   11/03/2023   Dulaglutide (TRULICITY) 1.5 MG/0.5ML SOAJ INJECT 1.5 MG (0.5ML) UNDER THE SKIN ONCE A WEEK 2 mL 2 10/29/2023   ezetimibe (ZETIA) 10 MG tablet Take 1 tablet (10 mg total) by mouth daily. 90 tablet 3 11/05/2023 at 0900   fluticasone (FLONASE) 50 MCG/ACT nasal spray SPRAY 2 SPRAYS INTO EACH NOSTRIL EVERY DAY 48 mL 1 11/05/2023   gabapentin (NEURONTIN) 300 MG capsule Take 1 capsule (300 mg total) by mouth 3 (three) times daily. 270 capsule 1 11/05/2023 at 0900   glipiZIDE (GLUCOTROL) 5 MG tablet Take 1 tablet (5 mg total) by mouth 2 (two) times daily before a meal. 180 tablet 3 11/05/2023 at 0900   ketorolac (ACULAR) 0.5 % ophthalmic solution INSTILL 1 DROP INTO RIGHT EYE 4 TIMES A DAY (Patient taking differently: Place 1 drop into the right eye 4 (four) times daily. INSTILL 1 DROP INTO RIGHT EYE 3 TIMES A DAY) 5 mL 6 11/04/2023   latanoprost (XALATAN) 0.005 % ophthalmic solution Place 1 drop into both eyes daily.   11/04/2023 at 2030    levocetirizine (XYZAL) 5 MG tablet TAKE 1 TABLET BY MOUTH EVERY DAY IN THE EVENING (Patient taking differently: Take 5 mg by mouth every evening.) 90 tablet 1 11/04/2023 at 2030   losartan (COZAAR) 50 MG tablet TAKE 1 TABLET BY MOUTH EVERY DAY 90 tablet 3 11/05/2023 at 0900   omeprazole (PRILOSEC) 40 MG capsule TAKE 1 CAP 2 TIMES DAILY. PLEASE CALL [phone] TO SCHEDULE AN OFFICE VISIT FOR MORE REFILLS. 180 capsule 0 11/05/2023 at 0900   OneTouch Delica Lancets 33G MISC Use as instructed to check blood sugar 2-3 times a day.  DX E11.9 (Patient taking differently: 1 each by Other route See admin instructions. Use as instructed to check blood sugar 2-3 times a day.  DX E11.9) 100 each 3 11/05/2023   ONETOUCH ULTRA TEST test strip CHECK BLOOD SUGAR 2 TO 3 TIMES DAILY AS DIRECTED (Patient taking differently: 1 each by Other route as needed for other.) 100 strip 5 11/05/2023   timolol (TIMOPTIC) 0.5 % ophthalmic solution Place 1 drop into both eyes 2 (two) times a day.   11/05/2023 at 0900   torsemide (DEMADEX) 10 MG tablet TAKE 1 TABLET BY MOUTH EVERY DAY 90 tablet 1 11/05/2023 at 0900   Alcohol Swabs (ALCOHOL PREP PAD) 70 % PADS Use to check blood sugars (Patient taking differently: 1 each by Other route See admin instructions. Use to check blood sugars) 100 each 3    blood glucose meter kit and supplies KIT Dispense based on patient and insurance preference. Use up to four times daily as directed. (FOR ICD-9 250.00, 250.01). (Patient taking differently: Inject 1 each into the skin as directed. Dispense based on patient and insurance preference. Use up to four times daily as directed. (FOR ICD-9 250.00, 250.01).) 1 each 11    clindamycin (CLEOCIN) 150 MG capsule Take 1 capsule (150 mg total) by mouth 3 (three) times daily. (Patient not taking: Reported on 11/06/2023) 15 capsule 0 Not Taking   FLUZONE HIGH-DOSE 0.5 ML injection       Assessment: Pharmacy consulted to dose heparin in this 82 year old female with  ACS/NSTEMI.  No prior anticoag noted.  CrCl = 24.8 ml/min  12/7 0922 HL 0.35,  therapeutic x1 12/7 1703 HL 0.34, therapeutic x2 12/8 0607 HL 0.29, SUBtherapeutic  12/8 1502 HL 0.43, therapeutic x1 12/8 2300 HL 0.42, therapeutic X 2   Goal of Therapy:  Heparin level 0.3-0.7 units/ml Monitor platelets by anticoagulation protocol: Yes   Plan:  Continue heparin infusion at 900 units/hour Recheck HL on 12/9 with AM labs  Monitor CBC and signs/symptoms of bleeding  Thank you for involving pharmacy in this patient's care.   Teryl Mcconaghy D Clinical Pharmacist 11/08/2023 12:03 AM

## 2023-11-08 NOTE — Progress Notes (Signed)
Transition of Care [facility]) - Inpatient Brief Assessment   Patient Details  Name: MAVI BILLIOT MRN: [id number] Date of Birth: 09/08/1941  Transition of [facility]) CM/SW Contact:    Truddie Hidden, RN Phone Number: 11/08/2023, 10:06 AM   Clinical Narrative: TOC continuing to follow patient's progress throughout discharge planning.   Transition of Care Asessment: Insurance and Status: Insurance coverage has been reviewed Patient has primary care physician: Yes Home environment has been reviewed: Home Prior level of function:: Independent Prior/Current Home Services: No current home services Social Determinants of Health Reivew: SDOH reviewed no interventions necessary Readmission risk has been reviewed: Yes Transition of care needs: no transition of care needs at this time

## 2023-11-08 NOTE — Progress Notes (Addendum)
Heart Failure Stewardship Pharmacy Note  PCP: Esperanza Richters, PA-C PCP-Cardiologist: Julien Nordmann, MD  HPI: Donna Fuller is a 82 y.o. female with DM, diastolic CHF (EF 65 to 70%, G2 DD 05/2023), CKD 3b, HTN, nonobstructive CAD on cath 2022(20% LAD stenosis) who presented with shortness of breath and general malaise. CXR on admission was read as normal. BNP on admission was elevated to 653.6, down from 2 months prior. Troponin was 130 trending to 158.   Pertinent cardiac history: Echo in 05/2019 showed LVEF of 60-65%. Echo in 10/2022 showed LVEF of 60-65% and grade II diastolic dysfunction. LHC in 10/2021 showed mild nonobstructive CAD. Most recent echo this admission showed LVEF of 55-60% with grade III diastolic dysfunction, mild-moderate MR, mild-moderate TR. It is read to have the appearance of possible amyloid.   Pertinent Lab Values: Creat  Date Value Ref Range Status  06/18/2023 1.90 (H) 0.60 - 0.95 mg/dL Final   Creatinine, Ser  Date Value Ref Range Status  11/08/2023 1.64 (H) 0.44 - 1.00 mg/dL Final   BUN  Date Value Ref Range Status  11/08/2023 42 (H) 8 - 23 mg/dL Final  16/08/9603 28 (H) 8 - 27 mg/dL Final   Potassium  Date Value Ref Range Status  11/08/2023 3.9 3.5 - 5.1 mmol/L Final   Sodium  Date Value Ref Range Status  11/08/2023 138 135 - 145 mmol/L Final  08/25/2023 145 (H) 134 - 144 mmol/L Final   Brain Natriuretic Peptide  Date Value Ref Range Status  06/18/2023 364 (H) <100 pg/mL Final    Comment:    . BNP levels increase with age in the general population with the highest values seen in individuals greater than 54 years of age. Reference: J. Am. Ladon Applebaum. Cardiol. 2002; 54:098-119. .    B Natriuretic Peptide  Date Value Ref Range Status  11/05/2023 653.6 (H) 0.0 - 100.0 pg/mL Final    Comment:    Performed at [facility], [address] [place], [place] [address]   Magnesium  Date Value Ref Range Status  11/21/2021 2.2 1.7 - 2.4  mg/dL Final    Comment:    Performed at [facility], [address] [place], [place] [address]   Hemoglobin A1C  Date Value Ref Range Status  10/21/2023 6.8 (A) 4.0 - 5.6 % Final   Hgb A1c MFr Bld  Date Value Ref Range Status  05/05/2022 6.2 4.6 - 6.5 % Final    Comment:    Glycemic Control Guidelines for People with Diabetes:Non Diabetic:  <6%Goal of Therapy: <7%Additional Action Suggested:  >8%    TSH  Date Value Ref Range Status  06/18/2023 1.69 0.40 - 4.50 mIU/L Final    Vital Signs:  Temp:  [97.4 F (36.3 C)-98.3 F (36.8 C)] 97.7 F (36.5 C) (12/09 0856) Pulse Rate:  [60-66] 65 (12/09 0856) Cardiac Rhythm: Sinus bradycardia (12/09 0700) Resp:  [17-18] 17 (12/09 0856) BP: (100-119)/(50-59) 119/54 (12/09 0856) SpO2:  [97 %-100 %] 97 % (12/09 0856)  Intake/Output Summary (Last 24 hours) at 11/08/2023 1020 Last data filed at 11/08/2023 0400 Gross per 24 hour  Intake 1286.23 ml  Output --  Net 1286.23 ml    Current Heart Failure Medications:  Loop diuretic: torsemide 10 mg every other day Beta-Blocker: carvedilol 12.5 mg bid ACEI/ARB/ARNI: losartan 50 mg daily MRA: none SGLT2i: none Other: none  Prior to admission Heart Failure Medications:  Loop diuretic: torsemide 10 mg every other day Beta-Blocker: carvedilol 12.5 mg bid ACEI/ARB/ARNI:  losartan 50 mg daily MRA: none SGLT2i: Farxiga 10 mg daily Other: none  Assessment: 1. Acute on chronic diastolic heart failure (LVEF 60-65%) and grade III diastolic dysfunction, due to NICM with possible infiltrative disease. NYHA class III symptoms.  -Symptoms: Reports improvement in shortness of breath, but still reports "random" events where SOB worsens. Has had orthopnea over the last year which remains present. Reports improvement in shortness of breath. Reports appetite is low. -Volume: Appears to still be somewhat volume up, though difficult to assess. Received one dose of IV Lasix this admission. Would  recommend resuming diuretics. -Hemodynamics: BP from 100-120/50s. HR is in 60s. CO2 is down today and low appetite. Will need to watch closely. -SGLT2i: Can consider adding - last UTI was in 04/2022 and does not appear to occur frequently. Would benefit HFpEF and CKD. -MRA: Would not start at this time given renal function. -ARNI: BP stable, not warranted at this time. -Patient's echo noted potential for cardiac amyloidosis. Had a recent kappa/lambda light chain that was mildly elevated in favor of kappa light chain, which is commonly seen in CKD. Patient has not yet underwent PYP scan or myocardial biopsy, thus does not yet qualify for tafamidis. Does have a positive history of peripheral neuropathy confounded by diabetes.  -Will need to be cautious with beta blockade if ruling out TTR amyloidosis given that if TTR related autonomic dysfunction were present, pt would be sensitive to changes in HR.  Plan: 1) Medication changes recommended at this time: -Consider giving torsemide 20 mg today and monitoring response -Consider resuming home Farxiga 10 mg daily  2) Patient assistance: -Pending  3) Education: - Patient has been educated on current HF medications and potential additions to HF medication regimen - Patient verbalizes understanding that over the next few months, these medication doses may change and more medications may be added to optimize HF regimen - Patient has been educated on basic disease state pathophysiology and goals of therapy  Medication Assistance / Insurance Benefits Check: Does the patient have prescription insurance?    Type of insurance plan:  Does the patient qualify for medication assistance through manufacturers or grants? Pending  Outpatient Pharmacy: Prior to admission outpatient pharmacy: [facility]     Please do not hesitate to reach out with questions or concerns,  Enos Fling, PharmD, CPP, BCPS Heart Failure Pharmacist  Phone - [phone] 11/08/2023  12:01 PM

## 2023-11-08 NOTE — Plan of Care (Signed)

## 2023-11-08 NOTE — Evaluation (Signed)
Physical Therapy Evaluation Patient Details Name: AVAH SMETHURST MRN: [id number] DOB: Jun 11, 1941 Today's Date: 11/08/2023  History of Present Illness  Patient is a 82 year old female with shortness of breath, chest pain. NSTEMI vs demand ischemia  Clinical Impression  Patient is agreeable to PT evaluation. She is hopeful to return home soon. Patient is independent at baseline and lives with supportive family members. She reports just having finished home health PT before this admission.  Today, the patient is modified independent with bed mobility and transfers. She walked with and without rolling walker. Recommend to continue using rolling walker for improved safety and balance. Mild dyspnea after walking that subsides quickly with rest break. No PT needs anticipated after this hospital stay. PT will continue to follow while in the hospital to maximize independence.       If plan is discharge home, recommend the following: Assist for transportation   Can travel by private vehicle        Equipment Recommendations None recommended by PT  Recommendations for Other Services       Functional Status Assessment Patient has had a recent decline in their functional status and demonstrates the ability to make significant improvements in function in a reasonable and predictable amount of time.     Precautions / Restrictions Precautions Precautions:  (low fall risk) Restrictions Weight Bearing Restrictions: No      Mobility  Bed Mobility Overal bed mobility: Modified Independent                  Transfers Overall transfer level: Modified independent                      Ambulation/Gait Ambulation/Gait assistance: Supervision Gait Distance (Feet): 110 Feet Assistive device: Rolling walker (2 wheels) Gait Pattern/deviations: Step-through pattern, Trunk flexed, Decreased stride length Gait velocity: decreased     General Gait Details: patient ambulated a short  distance without the rolling walker, reaching intermittently for furniture with CGA provided for safety. recommend to use rolling walker for improved balance/safety. mild dyspnea after walking that subsides with rest break  Stairs            Wheelchair Mobility     Tilt Bed    Modified Rankin (Stroke Patients Only)       Balance Overall balance assessment: No apparent balance deficits (not formally assessed)                                           Pertinent Vitals/Pain Pain Assessment Pain Assessment: No/denies pain    Home Living Family/patient expects to be discharged to:: Private residence Living Arrangements: Spouse/significant other;Children (daughter and spouse) Available Help at Discharge: Family;Available 24 hours/day Type of Home: House Home Access: Level entry       Home Layout: Able to live on main level with bedroom/bathroom Home Equipment: Rolling Walker (2 wheels);Rollator (4 wheels) Additional Comments: just finished home health PT    Prior Function Prior Level of Function : Independent/Modified Independent             Mobility Comments: independent without device       Extremity/Trunk Assessment   Upper Extremity Assessment Upper Extremity Assessment: Overall WFL for tasks assessed    Lower Extremity Assessment Lower Extremity Assessment: Generalized weakness (endurance impaired for sustained activity)       Communication  Communication Communication: No apparent difficulties  Cognition Arousal: Alert Behavior During Therapy: WFL for tasks assessed/performed Overall Cognitive Status: Within Functional Limits for tasks assessed                                          General Comments General comments (skin integrity, edema, etc.): heart rate in the 70-80's with activity    Exercises     Assessment/Plan    PT Assessment Patient needs continued PT services  PT Problem List Decreased  strength;Decreased activity tolerance;Decreased balance;Decreased mobility       PT Treatment Interventions DME instruction;Gait training;Stair training;Functional mobility training;Therapeutic activities;Therapeutic exercise;Balance training;Neuromuscular re-education;Patient/family education    PT Goals (Current goals can be found in the Care Plan section)  Acute Rehab PT Goals Patient Stated Goal: to go home today if possible PT Goal Formulation: With patient Time For Goal Achievement: 11/22/23 Potential to Achieve Goals: Good    Frequency Min 1X/week     Co-evaluation               AM-PAC PT "6 Clicks" Mobility  Outcome Measure Help needed turning from your back to your side while in a flat bed without using bedrails?: None Help needed moving from lying on your back to sitting on the side of a flat bed without using bedrails?: None Help needed moving to and from a bed to a chair (including a wheelchair)?: None Help needed standing up from a chair using your arms (e.g., wheelchair or bedside chair)?: None Help needed to walk in hospital room?: A Little Help needed climbing 3-5 steps with a railing? : A Little 6 Click Score: 22    End of Session   Activity Tolerance: Patient tolerated treatment well Patient left: in bed;with family/visitor present (seated on edge of be per patient preference) Nurse Communication: Mobility status PT Visit Diagnosis: Muscle weakness (generalized) (M62.81);Unsteadiness on feet (R26.81)    Time: 1308-6578 PT Time Calculation (min) (ACUTE ONLY): 18 min   Charges:   PT Evaluation $PT Eval Low Complexity: 1 Low PT Treatments $Therapeutic Activity: 8-22 mins PT General Charges $$ ACUTE PT VISIT: 1 Visit        Donna Bernard, PT, MPT   Ina Homes 11/08/2023, 9:54 AM

## 2023-11-08 NOTE — Plan of Care (Signed)
  Problem: Education: Goal: Ability to describe self-care measures that may prevent or decrease complications (Diabetes Survival Skills Education) will improve Outcome: Progressing Goal: Individualized Educational Video(s) Outcome: Progressing   Problem: Coping: Goal: Ability to adjust to condition or change in health will improve Outcome: Progressing   Problem: Fluid Volume: Goal: Ability to maintain a balanced intake and output will improve Outcome: Progressing   Problem: Health Behavior/Discharge Planning: Goal: Ability to identify and utilize available resources and services will improve Outcome: Progressing Goal: Ability to manage health-related needs will improve Outcome: Progressing   Problem: Metabolic: Goal: Ability to maintain appropriate glucose levels will improve Outcome: Progressing   Problem: Nutritional: Goal: Maintenance of adequate nutrition will improve Outcome: Progressing Goal: Progress toward achieving an optimal weight will improve Outcome: Progressing   Problem: Skin Integrity: Goal: Risk for impaired skin integrity will decrease Outcome: Progressing   Problem: Tissue Perfusion: Goal: Adequacy of tissue perfusion will improve Outcome: Progressing   Problem: Education: Goal: Understanding of cardiac disease, CV risk reduction, and recovery process will improve Outcome: Progressing Goal: Individualized Educational Video(s) Outcome: Progressing   Problem: Activity: Goal: Ability to tolerate increased activity will improve Outcome: Progressing   Problem: Cardiac: Goal: Ability to achieve and maintain adequate cardiovascular perfusion will improve Outcome: Progressing   Problem: Health Behavior/Discharge Planning: Goal: Ability to safely manage health-related needs after discharge will improve Outcome: Progressing   Problem: Education: Goal: Knowledge of General Education information will improve Description: Including pain rating scale,  medication(s)/side effects and non-pharmacologic comfort measures Outcome: Progressing   Problem: Health Behavior/Discharge Planning: Goal: Ability to manage health-related needs will improve Outcome: Progressing   Problem: Clinical Measurements: Goal: Ability to maintain clinical measurements within normal limits will improve Outcome: Progressing Goal: Will remain free from infection Outcome: Progressing Goal: Diagnostic test results will improve Outcome: Progressing Goal: Respiratory complications will improve Outcome: Progressing Goal: Cardiovascular complication will be avoided Outcome: Progressing   Problem: Activity: Goal: Risk for activity intolerance will decrease Outcome: Progressing   Problem: Nutrition: Goal: Adequate nutrition will be maintained Outcome: Progressing   Problem: Coping: Goal: Level of anxiety will decrease Outcome: Progressing   Problem: Elimination: Goal: Will not experience complications related to bowel motility Outcome: Progressing Goal: Will not experience complications related to urinary retention Outcome: Progressing   Problem: Pain Management: Goal: General experience of comfort will improve Outcome: Progressing   Problem: Safety: Goal: Ability to remain free from injury will improve Outcome: Progressing   Problem: Skin Integrity: Goal: Risk for impaired skin integrity will decrease Outcome: Progressing

## 2023-11-08 NOTE — Telephone Encounter (Signed)
Pt currently in hospital.

## 2023-11-08 NOTE — Discharge Summary (Signed)
Donna Fuller UVO:[id number] DOB: 09-01-1941 DOA: 11/05/2023  PCP: Esperanza Richters, PA-C  Admit date: 11/05/2023 Discharge date: 11/08/2023  Time spent: 35 minutes  Recommendations for Outpatient Follow-up:  Close cardiology f/u     Discharge Diagnoses:  Principal Problem:   NSTEMI (non-ST elevated myocardial infarction) [facility]) Active Problems:   CAD (coronary artery disease) 20% narrowing of LAD based on cardiac catheterization from 2022   (HFpEF) heart failure with preserved ejection fraction (HCC)   Respiratory tract infection   Hypertension   Spinal stenosis   History of breast cancer   Stage 3b chronic kidney disease (HCC)   Type 2 diabetes mellitus with hyperglycemia, without long-term current use of insulin (HCC)   DDD (degenerative disc disease), lumbar   Ductal carcinoma in situ (DCIS) of right breast   Anemia in chronic kidney disease   Thrombocytopenia (HCC)   Obesity, Class III, BMI 40-49.9 (morbid obesity) (HCC)   Rheumatoid arthritis (HCC)   Discharge Condition: stable  Diet recommendation: heart healthy  Filed Weights   11/05/23 1750  Weight: 87.1 kg    History of present illness:  Donna Fuller is a 82 y.o. female with medical history significant for DM, diastolic CHF (EF 65 to 70%, G2 DD 05/2023), CKD 3b, HTN,  nonobstructive CAD on cath 2022(20% LAD stenosis), last hospitalized from 7/12 to 06/13/2023 for CHF exacerbation and chest pain with flat troponin, who is being admitted for possible NSTEMI. Patient developed chest pain and shortness of breath on the day of arrival however this was preceded by a week of minimally productive cough and generalized malaise.  She denied fever or chills, lower extremity pain or swelling.  Hospital Course:  Patient presents with chest pain and dyspnea. Mild troponin elevation, was treated for nstemi with 48 hours of heparin. Infectious w/u negative, dimer also normal. Cardiology consulted and TTE obtained showing  diastolic dysfunction, no regional wall motion abnormalities, mildly reduced RV function, mild to mod MR/TR, right atrial pressure of 3. Symptomatically she improved, has normal o2 sats, and ambulated independently with PT with no home health needs. Discharged with plan for close cardiology f/u including 1 week f/u in CHF clinic  Procedures: none   Consultations: cardiology  Discharge Exam: Vitals:   11/08/23 0856 11/08/23 1212  BP: (!) 119/54 (!) 107/55  Pulse: 65 (!) 57  Resp: 17 17  Temp: 97.7 F (36.5 C) 97.8 F (36.6 C)  SpO2: 97% 100%    General: NAD Cardiovascular: RRR, soft systolic murmur Respiratory: CTAB Ext: trace edema  Discharge Instructions   Discharge Instructions     Diet - low sodium heart healthy   Complete by: As directed    Increase activity slowly   Complete by: As directed       Allergies as of 11/08/2023       Reactions   Penicillins Swelling   Sulfa Antibiotics Swelling   Amlodipine Swelling   LE Swelling   Lisinopril Cough        Medication List     STOP taking these medications    clindamycin 150 MG capsule Commonly known as: CLEOCIN       TAKE these medications    acetaminophen 650 MG CR tablet Commonly known as: TYLENOL Take 650 mg by mouth 2 (two) times daily.   albuterol 108 (90 Base) MCG/ACT inhaler Commonly known as: VENTOLIN HFA INHALE 2 PUFFS BY MOUTH EVERY 6 HOURS AS NEEDED What changed: reasons to take this   Alcohol Prep Pad 70 %  Pads Use to check blood sugars What changed:  how much to take how to take this when to take this   ascorbic acid 1000 MG tablet Commonly known as: VITAMIN C Take 1,000 mg by mouth daily.   aspirin EC 81 MG tablet Take 1 tablet (81 mg total) by mouth daily. Swallow whole.   B-12 PO Take 1 tablet by mouth daily.   benzonatate 100 MG capsule Commonly known as: TESSALON Take 1 capsule (100 mg total) by mouth 3 (three) times daily as needed for cough.   blood glucose  meter kit and supplies Kit Dispense based on patient and insurance preference. Use up to four times daily as directed. (FOR ICD-9 250.00, 250.01). What changed:  how much to take how to take this when to take this   brimonidine 0.2 % ophthalmic solution Commonly known as: ALPHAGAN Place 1 drop into both eyes 3 (three) times daily.   carvedilol 12.5 MG tablet Commonly known as: COREG Take 1 tablet (12.5 mg total) by mouth 2 (two) times daily with a meal.   ezetimibe 10 MG tablet Commonly known as: ZETIA Take 1 tablet (10 mg total) by mouth daily.   fluticasone 50 MCG/ACT nasal spray Commonly known as: FLONASE SPRAY 2 SPRAYS INTO EACH NOSTRIL EVERY DAY   Fluzone High-Dose 0.5 ML injection Generic drug: Influenza vac split quadrivalent PF   gabapentin 300 MG capsule Commonly known as: NEURONTIN Take 1 capsule (300 mg total) by mouth 3 (three) times daily.   glipiZIDE 5 MG tablet Commonly known as: GLUCOTROL Take 1 tablet (5 mg total) by mouth 2 (two) times daily before a meal.   ketorolac 0.5 % ophthalmic solution Commonly known as: ACULAR INSTILL 1 DROP INTO RIGHT EYE 4 TIMES A DAY What changed:  how much to take how to take this when to take this additional instructions   LANACANE EX Apply 1 application  topically daily as needed. Roll-on daily as needed for knee pain.   latanoprost 0.005 % ophthalmic solution Commonly known as: XALATAN Place 1 drop into both eyes daily.   levocetirizine 5 MG tablet Commonly known as: XYZAL TAKE 1 TABLET BY MOUTH EVERY DAY IN THE EVENING What changed:  how much to take how to take this   losartan 50 MG tablet Commonly known as: COZAAR TAKE 1 TABLET BY MOUTH EVERY DAY   omeprazole 40 MG capsule Commonly known as: PRILOSEC TAKE 1 CAP 2 TIMES DAILY. PLEASE CALL [phone] TO SCHEDULE AN OFFICE VISIT FOR MORE REFILLS.   OneTouch Delica Lancets 33G Misc Use as instructed to check blood sugar 2-3 times a day.  DX  E11.9 What changed:  how much to take how to take this when to take this   OneTouch Ultra Test test strip Generic drug: glucose blood CHECK BLOOD SUGAR 2 TO 3 TIMES DAILY AS DIRECTED What changed: See the new instructions.   timolol 0.5 % ophthalmic solution Commonly known as: TIMOPTIC Place 1 drop into both eyes 2 (two) times a day.   torsemide 10 MG tablet Commonly known as: DEMADEX TAKE 1 TABLET BY MOUTH EVERY DAY   Trulicity 1.5 MG/0.5ML Soaj Generic drug: Dulaglutide INJECT 1.5 MG (0.5ML) UNDER THE SKIN ONCE A WEEK   VITAMIN D-3 PO Take 2,000 Units by mouth daily.       Allergies  Allergen Reactions   Penicillins Swelling   Sulfa Antibiotics Swelling   Amlodipine Swelling    LE Swelling   Lisinopril Cough  Follow-up Information     Sabharwal, Aditya, DO. Go in 8 day(s).   Specialty: Cardiology Why: Hospital Follow-Up-11/16/23 Please bring all medications to appointment with you. [employer], [address], Second Floor Free Valet Parking at [employer] information: [address] [place] [address] [phone]         Saguier, Ramon Dredge, PA-C Follow up.   Specialties: Internal Medicine, Family Medicine Contact information: [address] [place] [address] [phone]         Georgeanna Lea, MD Follow up.   Specialty: Cardiology Contact information: [address] [place] [address] [phone]                  The results of significant diagnostics from this hospitalization (including imaging, microbiology, ancillary and laboratory) are listed below for reference.    Significant Diagnostic Studies: ECHOCARDIOGRAM COMPLETE  Result Date: 11/07/2023    ECHOCARDIOGRAM REPORT   Patient Name:   SUSA BARIBAULT Date of Exam: 11/07/2023 Medical Rec #:  [id number]        Height:       61.0 in Accession #:    [id number]       Weight:       192.0 lb Date of Birth:  03-09-1941         BSA:          1.856 m Patient Age:    82 years         BP:           120/61 mmHg Patient Gender: F                HR:           65 bpm. Exam Location:  [facility] Procedure: 2D Echo, Cardiac Doppler and Color Doppler Indications:     Chest Pain R07.9  History:         Patient has prior history of Echocardiogram examinations. CHF;                  Risk Factors:Diabetes.  Sonographer:     Neysa Bonito Roar Referring Phys:  [id number] Francee Nodal FURTH Diagnosing Phys: Jodelle Red MD IMPRESSIONS  1. Left ventricular ejection fraction, by estimation, is 55 to 60%. The left ventricle has normal function. The left ventricle has no regional wall motion abnormalities. There is moderate concentric left ventricular hypertrophy. Left ventricular diastolic parameters are consistent with Grade III diastolic dysfunction (restrictive).  2. Right ventricular systolic function is mildly reduced. The right ventricular size is mildly enlarged. There is mildly elevated pulmonary artery systolic pressure. The estimated right ventricular systolic pressure is 41.7 mmHg.  3. Left atrial size was moderately dilated.  4. Right atrial size was mild to moderately dilated.  5. The mitral valve is abnormal. Mild to moderate mitral valve regurgitation. Mild mitral stenosis. There is mild holosystolic prolapse of multiple segments of the anterior leaflet of the mitral valve.  6. Tricuspid valve regurgitation is mild to moderate.  7. The aortic valve is tricuspid. There is mild calcification of the aortic valve. There is mild thickening of the aortic valve. Aortic valve regurgitation is trivial. Aortic valve sclerosis/calcification is present, without any evidence of aortic stenosis.  8. The inferior vena cava is normal in size with greater than 50% respiratory variability, suggesting right atrial pressure of 3 mmHg. Conclusion(s)/Recommendation(s): LV has appearance of possible amyloid, with moderate to severe LVH and restrictive filling. Consider  cardiac MRI or PYP scan for further evaluation. FINDINGS  Left Ventricle: Left ventricular ejection fraction, by estimation, is 55 to 60%. The left ventricle has normal function. The left ventricle has no regional wall motion abnormalities. The left ventricular internal cavity size was normal in size. There is  moderate concentric left ventricular hypertrophy. Left ventricular diastolic parameters are consistent with Grade III diastolic dysfunction (restrictive). Right Ventricle: The right ventricular size is mildly enlarged. Right vetricular wall thickness was not well visualized. Right ventricular systolic function is mildly reduced. There is mildly elevated pulmonary artery systolic pressure. The tricuspid regurgitant velocity is 3.11 m/s, and with an assumed right atrial pressure of 3 mmHg, the estimated right ventricular systolic pressure is 41.7 mmHg. Left Atrium: Left atrial size was moderately dilated. Right Atrium: Right atrial size was mild to moderately dilated. Pericardium: There is no evidence of pericardial effusion. Mitral Valve: The mitral valve is abnormal. There is mild holosystolic prolapse of multiple segments of the anterior leaflet of the mitral valve. There is mild thickening of the mitral valve leaflet(s). There is mild calcification of the mitral valve leaflet(s). Mild to moderate mitral valve regurgitation, with posteriorly-directed jet. Mild mitral valve stenosis. MV peak gradient, 8.5 mmHg. The mean mitral valve gradient is 3.0 mmHg. Tricuspid Valve: The tricuspid valve is normal in structure. Tricuspid valve regurgitation is mild to moderate. No evidence of tricuspid stenosis. Aortic Valve: The aortic valve is tricuspid. There is mild calcification of the aortic valve. There is mild thickening of the aortic valve. Aortic valve regurgitation is trivial. Aortic valve sclerosis/calcification is present, without any evidence of aortic stenosis. Aortic valve mean gradient measures 4.0 mmHg.  Aortic valve peak gradient measures 7.5 mmHg. Aortic valve area, by VTI measures 1.38 cm. Pulmonic Valve: The pulmonic valve was grossly normal. Pulmonic valve regurgitation is mild. No evidence of pulmonic stenosis. Aorta: The aortic root, ascending aorta and aortic arch are all structurally normal, with no evidence of dilitation or obstruction. Venous: The inferior vena cava is normal in size with greater than 50% respiratory variability, suggesting right atrial pressure of 3 mmHg. IAS/Shunts: The atrial septum is grossly normal.  LEFT VENTRICLE PLAX 2D LVIDd:         3.70 cm   Diastology LVIDs:         2.70 cm   LV e' medial:    5.18 cm/s LV PW:         1.30 cm   LV E/e' medial:  26.1 LV IVS:        1.70 cm   LV e' lateral:   4.35 cm/s LVOT diam:     1.70 cm   LV E/e' lateral: 31.0 LV SV:         35 LV SV Index:   19 LVOT Area:     2.27 cm  RIGHT VENTRICLE RV Basal diam:  4.00 cm RV Mid diam:    3.30 cm RV S prime:     7.43 cm/s TAPSE (M-mode): 1.4 cm LEFT ATRIUM             Index        RIGHT ATRIUM           Index LA diam:        4.40 cm 2.37 cm/m   RA Area:     20.80 cm LA Vol (A2C):   64.4 ml 34.69 ml/m  RA Volume:   60.70 ml  32.70 ml/m LA Vol (A4C):   72.5 ml  39.06 ml/m LA Biplane Vol: 71.3 ml 38.41 ml/m  AORTIC VALVE                    PULMONIC VALVE AV Area (Vmax):    1.28 cm     PV Vmax:          0.66 m/s AV Area (Vmean):   1.25 cm     PV Peak grad:     1.7 mmHg AV Area (VTI):     1.38 cm     PR End Diast Vel: 5.95 msec AV Vmax:           137.00 cm/s  RVOT Peak grad:   1 mmHg AV Vmean:          96.000 cm/s AV VTI:            0.255 m AV Peak Grad:      7.5 mmHg AV Mean Grad:      4.0 mmHg LVOT Vmax:         77.40 cm/s LVOT Vmean:        52.900 cm/s LVOT VTI:          0.155 m LVOT/AV VTI ratio: 0.61  AORTA Ao Root diam: 2.80 cm Ao Asc diam:  3.30 cm MITRAL VALVE                TRICUSPID VALVE MV Area (PHT): 3.83 cm     TR Peak grad:   38.7 mmHg MV Area VTI:   0.83 cm     TR Vmax:        311.00  cm/s MV Peak grad:  8.5 mmHg MV Mean grad:  3.0 mmHg     SHUNTS MV Vmax:       1.46 m/s     Systemic VTI:  0.16 m MV Vmean:      74.8 cm/s    Systemic Diam: 1.70 cm MV Decel Time: 198 msec MV E velocity: 135.00 cm/s MV A velocity: 50.30 cm/s MV E/A ratio:  2.68 MV A Prime:    2.4 cm/s Jodelle Red MD Electronically signed by Jodelle Red MD Signature Date/Time: 11/07/2023/5:31:27 PM    Final    DG Chest 2 View  Result Date: 11/05/2023 CLINICAL DATA:  Chest pain, cough EXAM: CHEST - 2 VIEW COMPARISON:  10/25/2023 FINDINGS: Lungs are essentially clear.  No pleural effusion or pneumothorax. The heart is normal in size.  Thoracic aortic atherosclerosis. Visualized osseous structures are within normal limits. IMPRESSION: Normal chest radiographs. Electronically Signed   By: Charline Bills M.D.   On: 11/05/2023 19:06   US Venous Img Lower Unilateral Right  Result Date: 10/25/2023 CLINICAL DATA:  Right popliteal pain EXAM: RIGHT LOWER EXTREMITY VENOUS DOPPLER ULTRASOUND TECHNIQUE: Gray-scale sonography with compression, as well as color and duplex ultrasound, were performed to evaluate the deep venous system(s) from the level of the common femoral vein through the popliteal and proximal calf veins. COMPARISON:  None Available. FINDINGS: VENOUS Normal compressibility of the common femoral, superficial femoral, and popliteal veins, as well as the visualized calf veins. Visualized portions of profunda femoral vein and great saphenous vein unremarkable. No filling defects to suggest DVT on grayscale or color Doppler imaging. Doppler waveforms show normal direction of venous flow, normal respiratory plasticity and response to augmentation. Limited views of the contralateral common femoral vein are unremarkable. OTHER Elongated lymph node in the right groin which has a normal short axis diameter and maintains a normal central fatty hilum.  Limitations: none IMPRESSION: No evidence of right lower  extremity DVT. Electronically Signed   By: Charlett Nose M.D.   On: 10/25/2023 18:14   DG Chest 2 View  Result Date: 10/25/2023 CLINICAL DATA:  Edema EXAM: CHEST - 2 VIEW COMPARISON:  Chest x-ray 09/14/2023 FINDINGS: The heart is enlarged. There central pulmonary vascular congestion. There is no focal lung infiltrate, pleural effusion or pneumothorax. Right axillary surgical clips are present. No acute osseous abnormality. IMPRESSION: Cardiomegaly with central pulmonary vascular congestion. Electronically Signed   By: Darliss Cheney M.D.   On: 10/25/2023 17:51    Microbiology: Recent Results (from the past 240 hour(s))  Resp panel by RT-PCR (RSV, Flu A&B, Covid) Anterior Nasal Swab     Status: None   Collection Time: 11/05/23  6:02 PM   Specimen: Anterior Nasal Swab  Result Value Ref Range Status   SARS Coronavirus 2 by RT PCR NEGATIVE NEGATIVE Final    Comment: (NOTE) SARS-CoV-2 target nucleic acids are NOT DETECTED.  The SARS-CoV-2 RNA is generally detectable in upper respiratory specimens during the acute phase of infection. The lowest concentration of SARS-CoV-2 viral copies this assay can detect is 138 copies/mL. A negative result does not preclude SARS-Cov-2 infection and should not be used as the sole basis for treatment or other patient management decisions. A negative result may occur with  improper specimen collection/handling, submission of specimen other than nasopharyngeal swab, presence of viral mutation(s) within the areas targeted by this assay, and inadequate number of viral copies(<138 copies/mL). A negative result must be combined with clinical observations, patient history, and epidemiological information. The expected result is Negative.  Fact Sheet for Patients:  BloggerCourse.com  Fact Sheet for Healthcare Providers:  SeriousBroker.it  This test is no t yet approved or cleared by the [place] FDA and  has  been authorized for detection and/or diagnosis of SARS-CoV-2 by FDA under an Emergency Use Authorization (EUA). This EUA will remain  in effect (meaning this test can be used) for the duration of the COVID-19 declaration under Section 564(b)(1) of the Act, 21 U.S.C.section 360bbb-3(b)(1), unless the authorization is terminated  or revoked sooner.       Influenza A by PCR NEGATIVE NEGATIVE Final   Influenza B by PCR NEGATIVE NEGATIVE Final    Comment: (NOTE) The Xpert Xpress SARS-CoV-2/FLU/RSV plus assay is intended as an aid in the diagnosis of influenza from Nasopharyngeal swab specimens and should not be used as a sole basis for treatment. Nasal washings and aspirates are unacceptable for Xpert Xpress SARS-CoV-2/FLU/RSV testing.  Fact Sheet for Patients: BloggerCourse.com  Fact Sheet for Healthcare Providers: SeriousBroker.it  This test is not yet approved or cleared by the [place] FDA and has been authorized for detection and/or diagnosis of SARS-CoV-2 by FDA under an Emergency Use Authorization (EUA). This EUA will remain in effect (meaning this test can be used) for the duration of the COVID-19 declaration under Section 564(b)(1) of the Act, 21 U.S.C. section 360bbb-3(b)(1), unless the authorization is terminated or revoked.     Resp Syncytial Virus by PCR NEGATIVE NEGATIVE Final    Comment: (NOTE) Fact Sheet for Patients: BloggerCourse.com  Fact Sheet for Healthcare Providers: SeriousBroker.it  This test is not yet approved or cleared by the [place] FDA and has been authorized for detection and/or diagnosis of SARS-CoV-2 by FDA under an Emergency Use Authorization (EUA). This EUA will remain in effect (meaning this test can be used) for the duration of the COVID-19 declaration under  Section 564(b)(1) of the Act, 21 U.S.C. section 360bbb-3(b)(1), unless the  authorization is terminated or revoked.  Performed at [facility], [address] [place], [place] [address]   Respiratory (~20 pathogens) panel by PCR     Status: None   Collection Time: 11/06/23 10:18 AM   Specimen: Nasopharyngeal Swab; Respiratory  Result Value Ref Range Status   Adenovirus NOT DETECTED NOT DETECTED Final   Coronavirus 229E NOT DETECTED NOT DETECTED Final    Comment: (NOTE) The Coronavirus on the Respiratory Panel, DOES NOT test for the novel  Coronavirus (2019 nCoV)    Coronavirus HKU1 NOT DETECTED NOT DETECTED Final   Coronavirus NL63 NOT DETECTED NOT DETECTED Final   Coronavirus OC43 NOT DETECTED NOT DETECTED Final   Metapneumovirus NOT DETECTED NOT DETECTED Final   Rhinovirus / Enterovirus NOT DETECTED NOT DETECTED Final   Influenza A NOT DETECTED NOT DETECTED Final   Influenza B NOT DETECTED NOT DETECTED Final   Parainfluenza Virus 1 NOT DETECTED NOT DETECTED Final   Parainfluenza Virus 2 NOT DETECTED NOT DETECTED Final   Parainfluenza Virus 3 NOT DETECTED NOT DETECTED Final   Parainfluenza Virus 4 NOT DETECTED NOT DETECTED Final   Respiratory Syncytial Virus NOT DETECTED NOT DETECTED Final   Bordetella pertussis NOT DETECTED NOT DETECTED Final   Bordetella Parapertussis NOT DETECTED NOT DETECTED Final   Chlamydophila pneumoniae NOT DETECTED NOT DETECTED Final   Mycoplasma pneumoniae NOT DETECTED NOT DETECTED Final    Comment: Performed at [facility], [address] [place], [place] [address]  Expectorated Sputum Assessment w Gram Stain, Rflx to Resp Cult     Status: None   Collection Time: 11/07/23  6:40 AM   Specimen: Sputum  Result Value Ref Range Status   Specimen Description SPUTUM  Final   Special Requests NONE  Final   Sputum evaluation   Final    THIS SPECIMEN IS ACCEPTABLE FOR SPUTUM CULTURE Performed at [facility], [address] [place], [place] [address]    Report Status 11/07/2023 FINAL  Final   Culture, Respiratory w Gram Stain     Status: None (Preliminary result)   Collection Time: 11/07/23  6:40 AM   Specimen: SPU  Result Value Ref Range Status   Specimen Description   Final    SPUTUM Performed at [facility], [address] [place], [place] [address]    Special Requests   Final    NONE Reflexed from [id number] Performed at [facility], [address] [place], [place] [address]    Gram Stain   Final    FEW WBC SEEN FEW GRAM POSITIVE COCCI RARE GRAM NEGATIVE RODS Performed at [facility], [address] [place], [place] [address]    Culture PENDING  Incomplete   Report Status PENDING  Incomplete     Labs: Basic Metabolic Panel: Recent Labs  Lab 11/05/23 1802 11/07/23 0607 11/08/23 0545  NA 142 136 138  K 4.2 3.9 3.9  CL 104 101 104  CO2 25 25 20*  GLUCOSE 174* 118* 132*  BUN 43* 41* 42*  CREATININE 1.78* 1.75* 1.64*  CALCIUM 9.4 8.9 9.0   Liver Function Tests: No results for input(s): "AST", "ALT", "ALKPHOS", "BILITOT", "PROT", "ALBUMIN" in the last 168 hours. No results for input(s): "LIPASE", "AMYLASE" in the last 168 hours. No results for input(s): "AMMONIA" in the last 168 hours. CBC: Recent Labs  Lab 11/05/23 1802 11/07/23 0607 11/08/23 0545  WBC 12.1*  7.6 6.3  HGB 12.2 10.6* 10.7*  HCT 38.1 31.6* 32.4*  MCV 94.5 91.9 90.8  PLT 122* 106* 123*   Cardiac Enzymes: No results for input(s): "CKTOTAL", "CKMB", "CKMBINDEX", "TROPONINI" in the last 168 hours. BNP: BNP (last 3 results) Recent Labs    06/18/23 1448 08/17/23 1328 11/05/23 1802  BNP 364* 814.6* 653.6*    ProBNP (last 3 results) Recent Labs    08/25/23 1123 09/14/23 1455 10/25/23 1412  PROBNP 3,467* 531.0* 779.0*    CBG: Recent Labs  Lab 11/07/23 1155 11/07/23 1613 11/07/23 2104 11/08/23 0836 11/08/23 1213  GLUCAP 222* 111* 179* 128* 156*       Signed:  Silvano Bilis MD.  [facility] 11/08/2023, 2:33 PM

## 2023-11-08 NOTE — Progress Notes (Signed)
Heart Failure Nurse Navigator Progress Note  PCP: Esperanza Richters, PA-C PCP-Cardiologist: Julien Nordmann, MD Admission Diagnosis: None Admitted from: Home  Presentation:   Donna Fuller presented with malaise, shortness of breath and chest discomfort and nonproductive cough.  ECHO/ LVEF: 55-60%  Clinical Course:  Past Medical History:  Diagnosis Date   Arthritis    Breast cancer (HCC) 2007   Right breast, s/p mastectomy   Chronic diastolic congestive heart failure (HCC)    per patient, dx by cardiology   Diabetes mellitus without complication (HCC)    GERD (gastroesophageal reflux disease)    Gout    Hyperlipidemia    Hypertension    Spinal stenosis      Social History   Socioeconomic History   Marital status: Married    Spouse name: Jimmy   Number of children: 3   Years of education: 12   Highest education level: Not on file  Occupational History   Occupation: Retired  Tobacco Use   Smoking status: Former    Types: Cigarettes    Passive exposure: Never   Smokeless tobacco: Never  Vaping Use   Vaping status: Never Used  Substance and Sexual Activity   Alcohol use: No   Drug use: No   Sexual activity: Not Currently  Other Topics Concern   Not on file  Social History Narrative   Marital Status:  Married Hydrologist)    Children:  Daughter(1) Son (1)    Pets:  None    Living Situation: Lives with husband and daughter.     Occupation:  Retired Contractor)    Education: 12th Grade    Tobacco Use/Exposure:  She used to smoke socially during the weekends but quit 40 years ago.    Alcohol Use:  Occasional   Drug Use:  None   Diet:  Regular   Exercise:  None   Hobbies:  Reading and playing volleyball             Social Determinants of Health   Financial Resource Strain: Low Risk  (11/08/2023)   Overall Financial Resource Strain (CARDIA)    Difficulty of Paying Living Expenses: Not hard at all  Food Insecurity: No Food Insecurity (11/06/2023)   Hunger Vital Sign     Worried About Running Out of Food in the Last Year: Never true    Ran Out of Food in the Last Year: Never true  Transportation Needs: No Transportation Needs (11/08/2023)   PRAPARE - Administrator, Civil Service (Medical): No    Lack of Transportation (Non-Medical): No  Physical Activity: Inactive (10/01/2021)   Exercise Vital Sign    Days of Exercise per Week: 0 days    Minutes of Exercise per Session: 0 min  Stress: No Stress Concern Present (10/01/2021)   [employer] of Occupational Health - Occupational Stress Questionnaire    Feeling of Stress : Not at all  Social Connections: Moderately Integrated (10/01/2021)   Social Connection and Isolation Panel [NHANES]    Frequency of Communication with Friends and Family: More than three times a week    Frequency of Social Gatherings with Friends and Family: More than three times a week    Attends Religious Services: More than 4 times per year    Active Member of [employer] or Organizations: No    Attends Banker Meetings: Never    Marital Status: Married   Water engineer and Provision:  Detailed education and instructions provided on heart failure disease management  including the following:  Signs and symptoms of Heart Failure When to call the physician Importance of daily weights Low sodium diet Fluid restriction Medication management Anticipated future follow-up appointments  Patient education given on each of the above topics.  Patient acknowledges understanding via teach back method and acceptance of all instructions.  Education Materials:  "Living Better With Heart Failure" Booklet, HF zone tool, & Daily Weight Tracker Tool.  Patient has scale at home: Yes Patient has pill box at home: Yes    High Risk Criteria for Readmission and/or Poor Patient Outcomes: Heart failure hospital admissions (last 6 months): 1  No Show rate: 0 Difficult social situation: None Demonstrates medication  adherence: Yes Primary Language: English Literacy level: Reading, Writing and Comprehension  Barriers of Care:   Diet & Fluid Restrictions Daily Weights Medication Compliance   Considerations/Referrals:   Referral made to Heart Failure Pharmacist Stewardship: Yes Referral made to Heart Failure CSW/NCM TOC: No Referral made to Heart & Vascular TOC clinic: Yes-11/16/23 @ 11:15  Items for Follow-up on DC/TOC: Diet & Fluid Restrictions Daily Weights Medication Compliance Continued Heart Failure Education  Roxy Horseman, RN, BSN [facility] Heart Failure Navigator Secure Chat Only

## 2023-11-08 NOTE — Progress Notes (Signed)
PHARMACY - ANTICOAGULATION CONSULT NOTE  Pharmacy Consult for Heparin  Indication: chest pain/ACS  Allergies  Allergen Reactions   Penicillins Swelling   Sulfa Antibiotics Swelling   Amlodipine Swelling    LE Swelling   Lisinopril Cough    Patient Measurements: Height: 5\' 1"  (154.9 cm) Weight: 87.1 kg (192 lb) IBW/kg (Calculated) : 47.8 Heparin Dosing Weight: 68 kg   Vital Signs: Temp: 97.8 F (36.6 C) (12/09 0354) Temp Source: Oral (12/09 0354) BP: 114/58 (12/09 0354) Pulse Rate: 64 (12/09 0354)  Labs: Recent Labs    11/05/23 1802 11/05/23 2211 11/05/23 2341 11/06/23 0922 11/07/23 0607 11/07/23 1502 11/07/23 2300 11/08/23 0545  HGB 12.2  --   --   --  10.6*  --   --  10.7*  HCT 38.1  --   --   --  31.6*  --   --  32.4*  PLT 122*  --   --   --  106*  --   --  123*  APTT  --   --  33  --   --   --   --   --   LABPROT  --   --  15.8*  --   --   --   --   --   INR  --   --  1.2  --   --   --   --   --   HEPARINUNFRC  --   --   --    < > 0.29* 0.43 0.42 0.37  CREATININE 1.78*  --   --   --  1.75*  --   --   --   TROPONINIHS 96* 130* 158*  --   --   --   --   --    < > = values in this interval not displayed.    Estimated Creatinine Clearance: 25.3 mL/min (A) (by C-G formula based on SCr of 1.75 mg/dL (H)).   Medical History: Past Medical History:  Diagnosis Date   Arthritis    Breast cancer (HCC) 2007   Right breast, s/p mastectomy   Chronic diastolic congestive heart failure (HCC)    per patient, dx by cardiology   Diabetes mellitus without complication (HCC)    GERD (gastroesophageal reflux disease)    Gout    Hyperlipidemia    Hypertension    Spinal stenosis     Medications:  Medications Prior to Admission  Medication Sig Dispense Refill Last Dose   acetaminophen (TYLENOL) 650 MG CR tablet Take 650 mg by mouth 2 (two) times daily.   11/05/2023 at 0900   albuterol (VENTOLIN HFA) 108 (90 Base) MCG/ACT inhaler INHALE 2 PUFFS BY MOUTH EVERY 6 HOURS  AS NEEDED (Patient taking differently: Inhale 2 puffs into the lungs every 6 (six) hours as needed for wheezing or shortness of breath.) 8.5 each 2 prn   ascorbic acid (VITAMIN C) 1000 MG tablet Take 1,000 mg by mouth daily.   11/04/2023   aspirin EC 81 MG tablet Take 1 tablet (81 mg total) by mouth daily. Swallow whole. 90 tablet 3 11/04/2023 at 2030   Benzocaine-Benzethonium [facility] EX) Apply 1 application  topically daily as needed. Roll-on daily as needed for knee pain.   11/05/2023   benzonatate (TESSALON) 100 MG capsule Take 1 capsule (100 mg total) by mouth 3 (three) times daily as needed for cough. 30 capsule 0 11/01/2023   brimonidine (ALPHAGAN) 0.2 % ophthalmic solution Place 1 drop into both eyes  3 (three) times daily.   11/05/2023 at 0900   carvedilol (COREG) 12.5 MG tablet Take 1 tablet (12.5 mg total) by mouth 2 (two) times daily with a meal. 180 tablet 3 11/05/2023 at 0900   Cholecalciferol (VITAMIN D-3 PO) Take 2,000 Units by mouth daily.   11/05/2023 at 0900   Cyanocobalamin (B-12 PO) Take 1 tablet by mouth daily.   11/03/2023   Dulaglutide (TRULICITY) 1.5 MG/0.5ML SOAJ INJECT 1.5 MG (0.5ML) UNDER THE SKIN ONCE A WEEK 2 mL 2 10/29/2023   ezetimibe (ZETIA) 10 MG tablet Take 1 tablet (10 mg total) by mouth daily. 90 tablet 3 11/05/2023 at 0900   fluticasone (FLONASE) 50 MCG/ACT nasal spray SPRAY 2 SPRAYS INTO EACH NOSTRIL EVERY DAY 48 mL 1 11/05/2023   gabapentin (NEURONTIN) 300 MG capsule Take 1 capsule (300 mg total) by mouth 3 (three) times daily. 270 capsule 1 11/05/2023 at 0900   glipiZIDE (GLUCOTROL) 5 MG tablet Take 1 tablet (5 mg total) by mouth 2 (two) times daily before a meal. 180 tablet 3 11/05/2023 at 0900   ketorolac (ACULAR) 0.5 % ophthalmic solution INSTILL 1 DROP INTO RIGHT EYE 4 TIMES A DAY (Patient taking differently: Place 1 drop into the right eye 4 (four) times daily. INSTILL 1 DROP INTO RIGHT EYE 3 TIMES A DAY) 5 mL 6 11/04/2023   latanoprost (XALATAN) 0.005 % ophthalmic  solution Place 1 drop into both eyes daily.   11/04/2023 at 2030   levocetirizine (XYZAL) 5 MG tablet TAKE 1 TABLET BY MOUTH EVERY DAY IN THE EVENING (Patient taking differently: Take 5 mg by mouth every evening.) 90 tablet 1 11/04/2023 at 2030   losartan (COZAAR) 50 MG tablet TAKE 1 TABLET BY MOUTH EVERY DAY 90 tablet 3 11/05/2023 at 0900   omeprazole (PRILOSEC) 40 MG capsule TAKE 1 CAP 2 TIMES DAILY. PLEASE CALL [phone] TO SCHEDULE AN OFFICE VISIT FOR MORE REFILLS. 180 capsule 0 11/05/2023 at 0900   OneTouch Delica Lancets 33G MISC Use as instructed to check blood sugar 2-3 times a day.  DX E11.9 (Patient taking differently: 1 each by Other route See admin instructions. Use as instructed to check blood sugar 2-3 times a day.  DX E11.9) 100 each 3 11/05/2023   ONETOUCH ULTRA TEST test strip CHECK BLOOD SUGAR 2 TO 3 TIMES DAILY AS DIRECTED (Patient taking differently: 1 each by Other route as needed for other.) 100 strip 5 11/05/2023   timolol (TIMOPTIC) 0.5 % ophthalmic solution Place 1 drop into both eyes 2 (two) times a day.   11/05/2023 at 0900   torsemide (DEMADEX) 10 MG tablet TAKE 1 TABLET BY MOUTH EVERY DAY 90 tablet 1 11/05/2023 at 0900   Alcohol Swabs (ALCOHOL PREP PAD) 70 % PADS Use to check blood sugars (Patient taking differently: 1 each by Other route See admin instructions. Use to check blood sugars) 100 each 3    blood glucose meter kit and supplies KIT Dispense based on patient and insurance preference. Use up to four times daily as directed. (FOR ICD-9 250.00, 250.01). (Patient taking differently: Inject 1 each into the skin as directed. Dispense based on patient and insurance preference. Use up to four times daily as directed. (FOR ICD-9 250.00, 250.01).) 1 each 11    clindamycin (CLEOCIN) 150 MG capsule Take 1 capsule (150 mg total) by mouth 3 (three) times daily. (Patient not taking: Reported on 11/06/2023) 15 capsule 0 Not Taking   FLUZONE HIGH-DOSE 0.5 ML injection  Assessment: Pharmacy consulted to dose heparin in this 82 year old female with ACS/NSTEMI.  No prior anticoag noted. CrCl = 24.8 ml/min  12/7 0922 HL 0.35,  therapeutic x1 12/7 1703 HL 0.34, therapeutic x2 12/8 0607 HL 0.29, SUBtherapeutic  12/8 1502 HL 0.43, therapeutic x1 12/8 2300 HL 0.42, therapeutic X 2  12/9 0545 HL 0.37, therapeutic X 3  Goal of Therapy:  Heparin level 0.3-0.7 units/ml Monitor platelets by anticoagulation protocol: Yes   Plan:  Continue heparin infusion at 900 units/hour Recheck HL on 12/10 with AM labs  Monitor CBC and signs/symptoms of bleeding  Thank you for involving pharmacy in this patient's care.   Lachrista Heslin D Clinical Pharmacist 11/08/2023 7:06 AM

## 2023-11-08 NOTE — Progress Notes (Addendum)
Patient Name: Donna Fuller Date of Encounter: 11/08/2023 [facility] Cardiologist: Julien Nordmann, MD ; previously seen by Dr. Bing Matter.  Interval Summary  .    Patient seen on AM rounds. Daughter and husband remain at the bedside. Denies any chest pain or shortness of breath.  Walked in the hallway with the RN.  No major issues.  Just occasionally having some tightness in the chest.  Vital Signs .    Vitals:   11/07/23 1944 11/07/23 2339 11/08/23 0354 11/08/23 0856  BP: (!) 101/50 (!) 111/59 (!) 114/58 (!) 119/54  Pulse: 60 60 64 65  Resp: 18 18 18 17   Temp: (!) 97.4 F (36.3 C) 98.3 F (36.8 C) 97.8 F (36.6 C) 97.7 F (36.5 C)  TempSrc: Oral Oral Oral Oral  SpO2: 99% 99% 98% 97%  Weight:      Height:        Intake/Output Summary (Last 24 hours) at 11/08/2023 1130 Last data filed at 11/08/2023 0400 Gross per 24 hour  Intake 1286.23 ml  Output --  Net 1286.23 ml      11/05/2023    5:50 PM 10/25/2023    1:15 PM 10/21/2023   10:54 AM  Last 3 Weights  Weight (lbs) 192 lb 192 lb 193 lb 6.4 oz  Weight (kg) 87.091 kg 87.091 kg 87.726 kg      Telemetry/ECG    Sinus rates in the 60's - Personally Reviewed  Physical Exam .   GEN: No acute distress.   Neck: No JVD Cardiac: RRR, I/VI systolic murmur without rubs or gallops.  Respiratory: Clear with slightly diminished bases to auscultation bilaterally. Respirations are unlabored at rest on room air GI: Soft, nontender, non-distended  MS: Trace pretibial edema  Assessment & Plan .     Elevated high sensitivity troponins/demand ischemia/nonobstructive CAD-Prior left heart catheterization revealed nonobstructive coronary artery disease -She had rising high-sensitivity troponins of 130 and 158 baseline of 83 and 80-90 with heart failure symptoms on admission, suspect this could very well be demand ischemia as opposed to true ACS/non-STEMI.  Would like to consider demand ischemia. -EKG nonacute -She  completed 48 hours of heparin given the possibility of ACS. -Previously had discussed possible cath with patient and she has respectfully declined -Consider cardiac PET stress testing as an outpatient -Patient remains chest pain-free on exam -Continued on aspirin 81 mg daily, carvedilol 12.5 mg twice daily, ezetimibe 10 mg daily => Having completed 48 hours of IV heparin Milligram chest pain or significant heart failure symptoms after titration of medications, I suspect she is stable for discharge.  Acute on chronic diastolic heart failure-BNP 653.6 -presented with complaints of shortness of breath =>-Breathing has improved -She is maintaining oxygen saturations on room air -Echocardiogram revealed LVEF 55 to 60%, no RWMA, G3 DD, mildly elevated pulmonary artery systolic pressures, mild to moderate mitral valve regurgitation, mild mitral stenosis, mild holosystolic prolapse of multiple segments of the anterior leaflet of the mitral valve, mild to moderate tricuspid regurgitation, read to have the appearance of possible amyloid -Continued on GDMT of  torsemide, carvedilol, losartan, and Farxiga -Heart failure education -Daily weights, I's and O's, low-sodium diet  Anemia -Hemoglobin 10.7 -Was 12.2 on arrival -No active bleeding noted -Continue with daily CBCs  4.    Hypertension -Blood pressure 119/54 -Continue on current medication regimen -Vital signs per unit protocol  5.    Type 2 diabetes -Hemoglobin A1c 6.8 -Management per IM  6.     CKD  stage III -Serum creatinine 1.64 -Stable close to baseline -Avoid nephrotoxic agents were able -Monitor urine output -Daily BMP  For questions or updates, please contact [facility] Please consult [email or web address] for contact info under        Signed, SHERI HAMMOCK, NP   ATTENDING ATTESTATION  I have seen, examined and evaluated the patient this afternoon on rounds along with Charlsie Quest, NP.  After reviewing all the  available data and chart, we discussed the patients laboratory, study & physical findings as well as symptoms in detail.  I agree with her findings, examination as well as impression recommendations as per our discussion.    Attending adjustments noted in italics.  . She seems stable after 48 hours of IV heparin.  No additional symptoms.  Continue her home diuretic dosing.  Would recommend adjustment for sliding scale if she were to gain 3 pounds in 1 day, would take additional dose of torsemide.  Otherwise agree with medications as noted.  She is a patient of Dr. Bing Matter in our [facility]-will need close follow-up    Marykay Lex, MD, MS Bryan Lemma, M.D., M.S. Interventional Cardiologist  [facility]  Pager # [phone] Phone # [phone] [address] [place], [place] [address]

## 2023-11-08 NOTE — Progress Notes (Signed)
Reviewed AVS with patient. Removed PIV. All questions answered. Patient and husband voiced understanding.

## 2023-11-09 ENCOUNTER — Telehealth: Payer: Self-pay | Admitting: *Deleted

## 2023-11-09 ENCOUNTER — Encounter: Payer: Self-pay | Admitting: Medical

## 2023-11-09 ENCOUNTER — Ambulatory Visit (INDEPENDENT_AMBULATORY_CARE_PROVIDER_SITE_OTHER): Payer: PPO | Admitting: Medical

## 2023-11-09 VITALS — BP 139/68 | HR 72 | Temp 98.2°F | Resp 18 | Ht 61.0 in | Wt 189.0 lb

## 2023-11-09 DIAGNOSIS — I5032 Chronic diastolic (congestive) heart failure: Secondary | ICD-10-CM

## 2023-11-09 DIAGNOSIS — N183 Chronic kidney disease, stage 3 unspecified: Secondary | ICD-10-CM

## 2023-11-09 DIAGNOSIS — R944 Abnormal results of kidney function studies: Secondary | ICD-10-CM | POA: Diagnosis not present

## 2023-11-09 DIAGNOSIS — R0602 Shortness of breath: Secondary | ICD-10-CM

## 2023-11-09 DIAGNOSIS — R059 Cough, unspecified: Secondary | ICD-10-CM | POA: Diagnosis not present

## 2023-11-09 MED ORDER — BENZONATATE 100 MG PO CAPS: 100.0000 mg | ORAL_CAPSULE | Freq: Three times a day (TID) | ORAL | 0 refills | Status: DC | PRN

## 2023-11-09 MED ORDER — AZITHROMYCIN 250 MG PO TABS
ORAL_TABLET | ORAL | 0 refills | Status: AC
Start: 2023-11-09 — End: 2023-11-14

## 2023-11-09 NOTE — Transitions of Care (Post Inpatient/ED Visit) (Addendum)
11/09/2023  Name: Donna Fuller MRN: [id number] DOB: 12-Aug-1941  Today's TOC FU Call Status: Today's TOC FU Call Status:: Successful TOC FU Call Completed TOC FU Call Complete Date: 11/09/23 Patient's Name and Date of Birth confirmed.  Transition Care Management Follow-up Telephone Call Date of Discharge: 11/08/23 Discharge Facility: [facility]) How have you been since you were released from the hospital?: Better Any questions or concerns?: Yes Patient Questions/Concerns:: I have coughed all nicht with no rest Patient Questions/Concerns Addressed: Other: (Careguide scheduled appt to f/u with PCP today at 1:40)  Items Reviewed: Did you receive and understand the discharge instructions provided?: Yes Medications obtained,verified, and reconciled?: Yes (Medications Reviewed) (In reviewing medications with patient she had tessalon pearles ordered that she had forgot about for cough) Any new allergies since your discharge?: No Dietary orders reviewed?: No Do you have support at home?: Yes People in Home: spouse Name of Support/Comfort Primary Source: Jimmy  Medications Reviewed Today: Medications Reviewed Today     Reviewed by Luella Cook, RN (Case Manager) on 11/09/23 at 1037  Med List Status: <None>   Medication Order Taking? Sig Documenting Provider Last Dose Status Informant  acetaminophen (TYLENOL) 650 MG CR tablet [id number] Yes Take 650 mg by mouth 2 (two) times daily. [provider] Taking Active Self  albuterol (VENTOLIN HFA) 108 (90 Base) MCG/ACT inhaler [id number] Yes INHALE 2 PUFFS BY MOUTH EVERY 6 HOURS AS NEEDED  Patient taking differently: Inhale 2 puffs into the lungs every 6 (six) hours as needed for wheezing or shortness of breath.   Saguier, Ramon Dredge, PA-C Taking Active Self  Alcohol Swabs (ALCOHOL PREP PAD) 70 % PADS [id number] Yes Use to check blood sugars  Patient taking differently: 1 each by Other route See admin  instructions. Use to check blood sugars   Shamleffer, Konrad Dolores, MD Taking Active Self  ascorbic acid (VITAMIN C) 1000 MG tablet [id number] Yes Take 1,000 mg by mouth daily. [provider] Taking Active Self  aspirin EC 81 MG tablet [id number] Yes Take 1 tablet (81 mg total) by mouth daily. Swallow whole. Georgeanna Lea, MD Taking Active Self  Benzocaine-Benzethonium [facility]) [id number]  Apply 1 application  topically daily as needed. Roll-on daily as needed for knee pain. [provider]  Active Self  benzonatate (TESSALON) 100 MG capsule [id number] Yes Take 1 capsule (100 mg total) by mouth 3 (three) times daily as needed for cough. Saguier, Ramon Dredge, PA-C Taking Active Self  blood glucose meter kit and supplies KIT [id number] Yes Dispense based on patient and insurance preference. Use up to four times daily as directed. (FOR ICD-9 250.00, 250.01).  Patient taking differently: Inject 1 each into the skin as directed. Dispense based on patient and insurance preference. Use up to four times daily as directed. (FOR ICD-9 250.00, 250.01).   Tommie Sams, DO Taking Active Self  brimonidine (ALPHAGAN) 0.2 % ophthalmic solution [id number] Yes Place 1 drop into both eyes 3 (three) times daily. [provider] Taking Active Self  carvedilol (COREG) 12.5 MG tablet [id number] Yes Take 1 tablet (12.5 mg total) by mouth 2 (two) times daily with a meal. Georgeanna Lea, MD Taking Active Self  Cholecalciferol (VITAMIN D-3 PO) [id number] Yes Take 2,000 Units by mouth daily. [provider] Taking Active Self  Cyanocobalamin (B-12 PO) [id number] Yes Take 1 tablet by mouth daily. [provider] Taking Active Self  Dulaglutide (TRULICITY) 1.5 MG/0.5ML SOAJ [id number] Yes INJECT 1.5 MG (0.5ML)  UNDER THE SKIN ONCE A WEEK Shamleffer, Konrad Dolores, MD Taking Active Self  ezetimibe (ZETIA) 10 MG tablet [id number] Yes Take 1 tablet (10 mg total) by mouth daily.  Georgeanna Lea, MD Taking Active Self  fluticasone [facility]) 50 MCG/ACT nasal spray [id number] Yes SPRAY 2 SPRAYS INTO EACH NOSTRIL EVERY DAY Saguier, Ramon Dredge, [place] Taking Active Self  FLUZONE HIGH-DOSE 0.5 ML injection [id number]   [provider]  Active Self  gabapentin (NEURONTIN) 300 MG capsule [id number] Yes Take 1 capsule (300 mg total) by mouth 3 (three) times daily. Saguier, Ramon Dredge, PA-C Taking Active Self  glipiZIDE (GLUCOTROL) 5 MG tablet [id number] Yes Take 1 tablet (5 mg total) by mouth 2 (two) times daily before a meal. Shamleffer, Konrad Dolores, MD Taking Active Self  ketorolac (ACULAR) 0.5 % ophthalmic solution [id number] Yes INSTILL 1 DROP INTO RIGHT EYE 4 TIMES A DAY  Patient taking differently: Place 1 drop into the right eye 4 (four) times daily. INSTILL 1 DROP INTO RIGHT EYE 3 TIMES A DAY   Rankin, Alford Highland, MD Taking Active Self  latanoprost (XALATAN) 0.005 % ophthalmic solution [id number] Yes Place 1 drop into both eyes daily. [provider] Taking Active Self  levocetirizine (XYZAL) 5 MG tablet [id number] Yes TAKE 1 TABLET BY MOUTH EVERY DAY IN THE EVENING  Patient taking differently: Take 5 mg by mouth every evening.   Saguier, Ramon Dredge, PA-C Taking Active Self  losartan (COZAAR) 50 MG tablet [id number] Yes TAKE 1 TABLET BY MOUTH EVERY DAY Saguier, Ramon Dredge, PA-C Taking Active Self  omeprazole (PRILOSEC) 40 MG capsule [id number] Yes TAKE 1 CAP 2 TIMES DAILY. PLEASE CALL [phone] TO SCHEDULE AN OFFICE VISIT FOR MORE REFILLS. Unk Lightning, [place] Taking Active Self  OneTouch Delica Lancets 33G [place] [id number] Yes Use as instructed to check blood sugar 2-3 times a day.  DX E11.9  Patient taking differently: 1 each by Other route See admin instructions. Use as instructed to check blood sugar 2-3 times a day.  DX E11.9   Shamleffer, Konrad Dolores, MD Taking Active Self  ONETOUCH ULTRA TEST test strip [id number] Yes CHECK BLOOD SUGAR 2 TO 3 TIMES DAILY AS  DIRECTED  Patient taking differently: 1 each by Other route as needed for other.   Saguier, Ramon Dredge, PA-C Taking Active Self  timolol (TIMOPTIC) 0.5 % ophthalmic solution [id number] Yes Place 1 drop into both eyes 2 (two) times a day. [provider] Taking Active Self  torsemide (DEMADEX) 10 MG tablet [id number] Yes TAKE 1 TABLET BY MOUTH EVERY DAY Saguier, Ramon Dredge, PA-C Taking Active Self            Home Care and Equipment/Supplies: Were Home Health Services Ordered?: NA Any new equipment or medical supplies ordered?: NA  Functional Questionnaire: Do you need assistance with bathing/showering or dressing?: Yes Do you need assistance with meal preparation?: Yes Do you need assistance with eating?: No Do you have difficulty maintaining continence: No Do you need assistance with getting out of bed/getting out of a chair/moving?: No Do you have difficulty managing or taking your medications?: No  Follow up appointments reviewed: PCP Follow-up appointment confirmed?: Yes Date of PCP follow-up appointment?: 11/09/23 Follow-up Provider: [facility] Follow-up appointment confirmed?: Yes Date of Specialist follow-up appointment?: 11/16/23 Follow-Up Specialty Provider:: Dorthula Nettles Do you need transportation to your follow-up appointment?: No Do you understand care options if your condition(s) worsen?: Yes-patient verbalized understanding  SDOH Interventions Today    Flowsheet Row Most Recent Value  SDOH  Interventions   Food Insecurity Interventions Intervention Not Indicated  Housing Interventions Intervention Not Indicated  Transportation Interventions Intervention Not Indicated, Patient Resources (Friends/Family)  Utilities Interventions Intervention Not Indicated     Care guide schedule PCP Appt within 7 days Patient declined further outreach follow up calls.  Patient stated that she has a cough that has kept her up all night. RN made patient  aware that the tessalon capsules is on her medication sheet and that it is for cough TOC interventions discussed/reviewed: -Discussed/reviewed insurance/health plans benefits -Doctor visit discussed/reviewed -PCP -Doctor visits discussed/reviewed-Dr Moshe Cipro  follow up appt [id number] 11:20 with patient placed on waiting list for earlier appt. Patient declined the 11/16/2023 8 am appointment,  -Provided Verbal Education: 30-day TOC program, nutrition, meds & their functions, symptom mgmt., fall/safety measures in the home  Gean Maidens BSN RN Population Health- Transition of Care Team.  [facility] [phone]

## 2023-11-09 NOTE — Progress Notes (Signed)
Subjective:    Patient ID: Donna Fuller, female    DOB: Jun 26, 1941, 82 y.o.   MRN: [id number]  HPI  Pt in for follow up.  DC summary below.  Recommendations for Outpatient Follow-up:  Close cardiology f/u        Discharge Diagnoses:  Principal Problem:   NSTEMI (non-ST elevated myocardial infarction) [facility]) Active Problems:   CAD (coronary artery disease) 20% narrowing of LAD based on cardiac catheterization from 2022   (HFpEF) heart failure with preserved ejection fraction (HCC)   Respiratory tract infection   Hypertension   Spinal stenosis   History of breast cancer   Stage 3b chronic kidney disease (HCC)   Type 2 diabetes mellitus with hyperglycemia, without long-term current use of insulin (HCC)   DDD (degenerative disc disease), lumbar   Ductal carcinoma in situ (DCIS) of right breast   Anemia in chronic kidney disease   Thrombocytopenia (HCC)   Obesity, Class III, BMI 40-49.9 (morbid obesity) (HCC)   Rheumatoid arthritis (HCC)   Weight: 87.1 kg      History of present illness:  Donna Fuller is a 82 y.o. female with medical history significant for DM, diastolic CHF (EF 65 to 70%, G2 DD 05/2023), CKD 3b, HTN,  nonobstructive CAD on cath 2022(20% LAD stenosis), last hospitalized from 7/12 to 06/13/2023 for CHF exacerbation and chest pain with flat troponin, who is being admitted for possible NSTEMI. Patient developed chest pain and shortness of breath on the day of arrival however this was preceded by a week of minimally productive cough and generalized malaise.  She denied fever or chills, lower extremity pain or swelling.   Hospital Course:  Patient presents with chest pain and dyspnea. Mild troponin elevation, was treated for nstemi with 48 hours of heparin. Infectious w/u negative, dimer also normal. Cardiology consulted and TTE obtained showing diastolic dysfunction, no regional wall motion abnormalities, mildly reduced RV function, mild to mod MR/TR, right  atrial pressure of 3. Symptomatically she improved, has normal o2 sats, and ambulated independently with PT with no home health needs. Discharged with plan for close cardiology f/u including 1 week f/u in CHF clinic  TAKE these medications     acetaminophen 650 MG CR tablet Commonly known as: TYLENOL Take 650 mg by mouth 2 (two) times daily.    albuterol 108 (90 Base) MCG/ACT inhaler Commonly known as: VENTOLIN HFA INHALE 2 PUFFS BY MOUTH EVERY 6 HOURS AS NEEDED What changed: reasons to take this    Alcohol Prep Pad 70 % Pads Use to check blood sugars What changed:  how much to take how to take this when to take this    ascorbic acid 1000 MG tablet Commonly known as: VITAMIN C Take 1,000 mg by mouth daily.    aspirin EC 81 MG tablet Take 1 tablet (81 mg total) by mouth daily. Swallow whole.    B-12 PO Take 1 tablet by mouth daily.    benzonatate 100 MG capsule Commonly known as: TESSALON Take 1 capsule (100 mg total) by mouth 3 (three) times daily as needed for cough.    blood glucose meter kit and supplies Kit Dispense based on patient and insurance preference. Use up to four times daily as directed. (FOR ICD-9 250.00, 250.01). What changed:  how much to take how to take this when to take this    brimonidine 0.2 % ophthalmic solution Commonly known as: ALPHAGAN Place 1 drop into both eyes 3 (three) times daily.  carvedilol 12.5 MG tablet Commonly known as: COREG Take 1 tablet (12.5 mg total) by mouth 2 (two) times daily with a meal.    ezetimibe 10 MG tablet Commonly known as: ZETIA Take 1 tablet (10 mg total) by mouth daily.    fluticasone 50 MCG/ACT nasal spray Commonly known as: FLONASE SPRAY 2 SPRAYS INTO EACH NOSTRIL EVERY DAY    Fluzone High-Dose 0.5 ML injection Generic drug: Influenza vac split quadrivalent PF    gabapentin 300 MG capsule Commonly known as: NEURONTIN Take 1 capsule (300 mg total) by mouth 3 (three) times daily.    glipiZIDE 5  MG tablet Commonly known as: GLUCOTROL Take 1 tablet (5 mg total) by mouth 2 (two) times daily before a meal.    ketorolac 0.5 % ophthalmic solution Commonly known as: ACULAR INSTILL 1 DROP INTO RIGHT EYE 4 TIMES A DAY What changed:  how much to take how to take this when to take this additional instructions    LANACANE EX Apply 1 application  topically daily as needed. Roll-on daily as needed for knee pain.    latanoprost 0.005 % ophthalmic solution Commonly known as: XALATAN Place 1 drop into both eyes daily.    levocetirizine 5 MG tablet Commonly known as: XYZAL TAKE 1 TABLET BY MOUTH EVERY DAY IN THE EVENING What changed:  how much to take how to take this    losartan 50 MG tablet Commonly known as: COZAAR TAKE 1 TABLET BY MOUTH EVERY DAY    omeprazole 40 MG capsule Commonly known as: PRILOSEC TAKE 1 CAP 2 TIMES DAILY. PLEASE CALL [phone] TO SCHEDULE AN OFFICE VISIT FOR MORE REFILLS.    OneTouch Delica Lancets 33G Misc Use as instructed to check blood sugar 2-3 times a day.  DX E11.9 What changed:  how much to take how to take this when to take this    OneTouch Ultra Test test strip Generic drug: glucose blood CHECK BLOOD SUGAR 2 TO 3 TIMES DAILY AS DIRECTED What changed: See the new instructions.    timolol 0.5 % ophthalmic solution Commonly known as: TIMOPTIC Place 1 drop into both eyes 2 (two) times a day.    torsemide 10 MG tablet Commonly known as: DEMADEX TAKE 1 TABLET BY MOUTH EVERY DAY    Trulicity 1.5 MG/0.5ML Soaj Generic drug: Dulaglutide INJECT 1.5 MG (0.5ML) UNDER THE SKIN ONCE A WEEK    VITAMIN D-3 PO Take 2,000 Units by mouth daily.   Follow-up Information       Sabharwal, Aditya, DO. Go in 8 day(s).   Specialty: Cardiology Why: Hospital Follow-Up-11/16/23 Please bring all medications to appointment with you. [facility], [address], Second Floor Free Valet Parking at [employer] information: [address] [place] [address] [phone]              Myrle Dues, Ramon Dredge, PA-C Follow up.   Specialties: Internal Medicine, Family Medicine Contact information: [address] [place] [address] [phone]    Pt in for follow up. Pt summarizes recent events and explained.    Pt family wants pt to be established with cardiologist for second opinion. They are polite about request for second opinion.   Since discharge pt states doing overall well but cough has persisted since hospitalization. But recently worse. She did not sleep well due to cough. Last chest xray showed normal xray that was on 11-05-23.   Pt sputum culture.    FEW WBC  SEEN FEW GRAM POSITIVE COCCI RARE GRAM NEGATIVE RODS   Culture CULTURE REINCUBATED FOR BETTER GROWTH Performed at [facility], [address], [place], [place] [address]          Review of Systems  Constitutional:  Positive for fatigue. Negative for chills and fever.  Respiratory:  Positive for cough. Negative for chest tightness, shortness of breath and wheezing.   Cardiovascular:  Negative for chest pain and palpitations.  Gastrointestinal:  Negative for abdominal pain, blood in stool and nausea.  Musculoskeletal:  Negative for back pain and joint swelling.  Skin:  Negative for rash.  Neurological:  Negative for dizziness, tremors, seizures and headaches.  Hematological:  Negative for adenopathy. Does not bruise/bleed easily.  Psychiatric/Behavioral:  Negative for behavioral problems and decreased concentration.     Past Medical History:  Diagnosis Date   Arthritis    Breast cancer [facility]) 2007   Right breast, s/p mastectomy   Chronic diastolic congestive heart failure (HCC)    per patient, dx by cardiology   Diabetes mellitus without complication (HCC)    GERD (gastroesophageal reflux disease)    Gout    Hyperlipidemia    Hypertension    Spinal stenosis      Social History   Socioeconomic  History   Marital status: Married    Spouse name: Jimmy   Number of children: 3   Years of education: 12   Highest education level: Not on file  Occupational History   Occupation: Retired  Tobacco Use   Smoking status: Former    Types: Cigarettes    Passive exposure: Never   Smokeless tobacco: Never  Vaping Use   Vaping status: Never Used  Substance and Sexual Activity   Alcohol use: No   Drug use: No   Sexual activity: Not Currently  Other Topics Concern   Not on file  Social History Narrative   Marital Status:  Married Hydrologist)    Children:  Daughter(1) Son (1)    Pets:  None    Living Situation: Lives with husband and daughter.     Occupation:  Retired Contractor)    Education: 12th Grade    Tobacco Use/Exposure:  She used to smoke socially during the weekends but quit 40 years ago.    Alcohol Use:  Occasional   Drug Use:  None   Diet:  Regular   Exercise:  None   Hobbies:  Reading and playing volleyball             Social Determinants of Health   Financial Resource Strain: Low Risk  (11/08/2023)   Overall Financial Resource Strain (CARDIA)    Difficulty of Paying Living Expenses: Not hard at all  Food Insecurity: No Food Insecurity (11/09/2023)   Hunger Vital Sign    Worried About Running Out of Food in the Last Year: Never true    Ran Out of Food in the Last Year: Never true  Transportation Needs: No Transportation Needs (11/09/2023)   PRAPARE - Administrator, Civil Service (Medical): No    Lack of Transportation (Non-Medical): No  Physical Activity: Inactive (10/01/2021)   Exercise Vital Sign    Days of Exercise per Week: 0 days    Minutes of Exercise per Session: 0 min  Stress: No Stress Concern Present (10/01/2021)   [employer] of Occupational Health - Occupational Stress Questionnaire    Feeling of Stress : Not at all  Social Connections: Moderately Integrated (10/01/2021)   Social  Connection and Isolation Panel [NHANES]    Frequency of  Communication with Friends and Family: More than three times a week    Frequency of Social Gatherings with Friends and Family: More than three times a week    Attends Religious Services: More than 4 times per year    Active Member of Clubs or Organizations: No    Attends Banker Meetings: Never    Marital Status: Married  Catering manager Violence: Not At Risk (11/09/2023)   Humiliation, Afraid, Rape, and Kick questionnaire    Fear of Current or Ex-Partner: No    Emotionally Abused: No    Physically Abused: No    Sexually Abused: No    Past Surgical History:  Procedure Laterality Date   ABDOMINAL HYSTERECTOMY     ABDOMINAL SURGERY     BREAST BIOPSY Left    2016 benign    BREAST BIOPSY Left 02/16/2023   MM LT BREAST BX W LOC DEV 1ST LESION IMAGE BX SPEC STEREO GUIDE 02/16/2023 [facility]MO[facility]   LEFT HEART CATH AND CORONARY ANGIOGRAPHY N/A 11/21/2021   Procedure: LEFT HEART CATH AND CORONARY ANGIOGRAPHY;  Surgeon: Iran Ouch, MD;  Location: [facility] INVASIVE CV LAB;  Service: Cardiovascular;  Laterality: N/A;   MASTECTOMY     right side   REPLACEMENT TOTAL KNEE Bilateral    TUBAL LIGATION      Family History  Problem Relation Age of Onset   Diabetes Mother    Alzheimer's disease Mother    Hypertension Mother    Hypertension Father    Heart disease Father    Hyperlipidemia Father    Kidney disease Father    Diabetes Sister    Cancer Brother        lung   Alzheimer's disease Brother    Heart disease Brother    Hypertension Brother    Diabetes Brother    Hypertension Brother    Diabetes Brother    Hyperlipidemia Son    Hypertension Son    Diabetes Son    Hypertension Daughter    Hyperlipidemia Daughter     Allergies  Allergen Reactions   Penicillins Swelling   Sulfa Antibiotics Swelling   Amlodipine Swelling    LE Swelling   Lisinopril Cough    Current Outpatient Medications on File Prior to Visit  Medication Sig Dispense Refill    acetaminophen (TYLENOL) 650 MG CR tablet Take 650 mg by mouth 2 (two) times daily.     albuterol (VENTOLIN HFA) 108 (90 Base) MCG/ACT inhaler INHALE 2 PUFFS BY MOUTH EVERY 6 HOURS AS NEEDED (Patient taking differently: Inhale 2 puffs into the lungs every 6 (six) hours as needed for wheezing or shortness of breath.) 8.5 each 2   Alcohol Swabs (ALCOHOL PREP PAD) 70 % PADS Use to check blood sugars (Patient taking differently: 1 each by Other route See admin instructions. Use to check blood sugars) 100 each 3   ascorbic acid (VITAMIN C) 1000 MG tablet Take 1,000 mg by mouth daily.     aspirin EC 81 MG tablet Take 1 tablet (81 mg total) by mouth daily. Swallow whole. 90 tablet 3   Benzocaine-Benzethonium (LANACANE EX) Apply 1 application  topically daily as needed. Roll-on daily as needed for knee pain.     benzonatate (TESSALON) 100 MG capsule Take 1 capsule (100 mg total) by mouth 3 (three) times daily as needed for cough. 30 capsule 0   blood glucose meter kit and supplies KIT Dispense based on  patient and insurance preference. Use up to four times daily as directed. (FOR ICD-9 250.00, 250.01). (Patient taking differently: Inject 1 each into the skin as directed. Dispense based on patient and insurance preference. Use up to four times daily as directed. (FOR ICD-9 250.00, 250.01).) 1 each 11   brimonidine (ALPHAGAN) 0.2 % ophthalmic solution Place 1 drop into both eyes 3 (three) times daily.     carvedilol (COREG) 12.5 MG tablet Take 1 tablet (12.5 mg total) by mouth 2 (two) times daily with a meal. 180 tablet 3   Cholecalciferol (VITAMIN D-3 PO) Take 2,000 Units by mouth daily.     Cyanocobalamin (B-12 PO) Take 1 tablet by mouth daily.     Dulaglutide (TRULICITY) 1.5 MG/0.5ML SOAJ INJECT 1.5 MG (0.5ML) UNDER THE SKIN ONCE A WEEK 2 mL 2   ezetimibe (ZETIA) 10 MG tablet Take 1 tablet (10 mg total) by mouth daily. 90 tablet 3   fluticasone (FLONASE) 50 MCG/ACT nasal spray SPRAY 2 SPRAYS INTO EACH NOSTRIL  EVERY DAY 48 mL 1   FLUZONE HIGH-DOSE 0.5 ML injection      gabapentin (NEURONTIN) 300 MG capsule Take 1 capsule (300 mg total) by mouth 3 (three) times daily. 270 capsule 1   glipiZIDE (GLUCOTROL) 5 MG tablet Take 1 tablet (5 mg total) by mouth 2 (two) times daily before a meal. 180 tablet 3   ketorolac (ACULAR) 0.5 % ophthalmic solution INSTILL 1 DROP INTO RIGHT EYE 4 TIMES A DAY (Patient taking differently: Place 1 drop into the right eye 4 (four) times daily. INSTILL 1 DROP INTO RIGHT EYE 3 TIMES A DAY) 5 mL 6   latanoprost (XALATAN) 0.005 % ophthalmic solution Place 1 drop into both eyes daily.     levocetirizine (XYZAL) 5 MG tablet TAKE 1 TABLET BY MOUTH EVERY DAY IN THE EVENING (Patient taking differently: Take 5 mg by mouth every evening.) 90 tablet 1   losartan (COZAAR) 50 MG tablet TAKE 1 TABLET BY MOUTH EVERY DAY 90 tablet 3   omeprazole (PRILOSEC) 40 MG capsule TAKE 1 CAP 2 TIMES DAILY. PLEASE CALL [phone] TO SCHEDULE AN OFFICE VISIT FOR MORE REFILLS. 180 capsule 0   OneTouch Delica Lancets 33G MISC Use as instructed to check blood sugar 2-3 times a day.  DX E11.9 (Patient taking differently: 1 each by Other route See admin instructions. Use as instructed to check blood sugar 2-3 times a day.  DX E11.9) 100 each 3   ONETOUCH ULTRA TEST test strip CHECK BLOOD SUGAR 2 TO 3 TIMES DAILY AS DIRECTED (Patient taking differently: 1 each by Other route as needed for other.) 100 strip 5   timolol (TIMOPTIC) 0.5 % ophthalmic solution Place 1 drop into both eyes 2 (two) times a day.     torsemide (DEMADEX) 10 MG tablet TAKE 1 TABLET BY MOUTH EVERY DAY 90 tablet 1   No current facility-administered medications on file prior to visit.    BP 139/68   Pulse 72   Temp 98.2 F (36.8 C)   Resp 18   Ht 5\' 1"  (1.549 m)   Wt 189 lb (85.7 kg)   SpO2 98%   BMI 35.71 kg/m        Objective:   Physical Exam   General Mental Status- Alert. General Appearance- Not in acute distress.    Skin General: Color- Normal Color. Moisture- Normal Moisture.  Neck Carotid Arteries- Normal color. Moisture- Normal Moisture. No carotid bruits. No JVD.  Chest and Lung Exam  Auscultation: Breath Sounds:-Normal.  Cardiovascular Auscultation:Rythm- Regular. Murmurs & Other Heart Sounds:Auscultation of the heart reveals- No Murmurs.  Abdomen Inspection:-Inspeection Normal. Palpation/Percussion:Note:No mass. Palpation and Percussion of the abdomen reveal- Non Tender, Non Distended + BS, no rebound or guarding.   Neurologic Cranial Nerve exam:- CN III-XII intact(No nystagmus), symmetric smile. Strength:- 5/5 equal and symmetric strength both upper and lower extremities.      Assessment & Plan:   Patient Instructions  Chronic diastolic congestive heart failure (HCC) -continue carvedilol 12. 5 mg bid and torsemide 10 mg daily.  - B Nat Peptide - Comp Met (CMET) -keep watch on your weight and update [place] on wt gain or leg swelling -recent non stemi vs demand ischemia related to chf. -on review on final dc summary appears leaned toward n- stemi. On review of prior pt chronic troponin elevation and her stable presentation decided troponin not indicated.  Decreased GFR/Stage 3 chronic kidney disease, unspecified whether stage 3a or 3b CKD (HCC) -continue to follow up with nephrologist - Comp Met (CMET)   Cough, unspecified type. With fatigue. -some bacteria on culture. Some productive cough -rx azithromycin antibiotic. -await final sputum culture.  Follow up date to be determined after lab and cardiology follow note review.       Esperanza Richters, PA-C   Time spent with patient today was  45 minutes which consisted of chart review, discussing diagnosis, work up, treatment and documentation.

## 2023-11-09 NOTE — Patient Instructions (Addendum)
Chronic diastolic congestive heart failure (HCC) -continue carvedilol 12. 5 mg bid and torsemide 10 mg daily.  - B Nat Peptide - Comp Met (CMET) -keep watch on your weight and update [place] on wt gain or leg swelling -recent non stemi vs demand ischemia related to chf. -on review on final dc summary appears leaned toward n- stemi. On review of prior pt chronic troponin elevation and with her stable presentation decided troponin not indicated/benaficial. -placed referral to [facility] at pt request. But explained keep appointment with current established cardiologist and follow up with [facility] on 11-16-23.  Decreased GFR/Stage 3 chronic kidney disease, unspecified whether stage 3a or 3b CKD (HCC) -continue to follow up with nephrologist - Comp Met (CMET)   Cough, unspecified type. With fatigue. -some bacteria on culture. Some productive cough -rx azithromycin antibiotic. -await final sputum culture. -benzonatate refill for cough  Follow up date to be determined after lab and cardiology follow note review.

## 2023-11-10 LAB — COMPREHENSIVE METABOLIC PANEL
ALT: 7 U/L (ref 0–35)
AST: 12 U/L (ref 0–37)
Albumin: 4.2 g/dL (ref 3.5–5.2)
Alkaline Phosphatase: 116 U/L (ref 39–117)
BUN: 35 mg/dL — ABNORMAL HIGH (ref 6–23)
CO2: 27 meq/L (ref 19–32)
Calcium: 9.7 mg/dL (ref 8.4–10.5)
Chloride: 104 meq/L (ref 96–112)
Creatinine, Ser: 1.49 mg/dL — ABNORMAL HIGH (ref 0.40–1.20)
GFR: 32.66 mL/min — ABNORMAL LOW (ref >60.00)
Glucose, Bld: 188 mg/dL — ABNORMAL HIGH (ref 70–99)
Potassium: 4.3 meq/L (ref 3.5–5.1)
Sodium: 142 meq/L (ref 135–145)
Total Bilirubin: 1 mg/dL (ref 0.2–1.2)
Total Protein: 7.3 g/dL (ref 6.0–8.3)

## 2023-11-10 LAB — CULTURE, RESPIRATORY W GRAM STAIN

## 2023-11-10 LAB — BRAIN NATRIURETIC PEPTIDE: Pro B Natriuretic peptide (BNP): 1117 pg/mL — ABNORMAL HIGH (ref 0.0–100.0)

## 2023-11-10 NOTE — Addendum Note (Signed)
Addended by: Gwenevere Abbot on: 11/10/2023 02:14 PM   Modules accepted: Orders

## 2023-11-11 ENCOUNTER — Other Ambulatory Visit: Payer: Self-pay | Admitting: Internal Medicine

## 2023-11-11 ENCOUNTER — Other Ambulatory Visit: Payer: Self-pay | Admitting: Medical

## 2023-11-11 DIAGNOSIS — E1142 Type 2 diabetes mellitus with diabetic polyneuropathy: Secondary | ICD-10-CM

## 2023-11-15 ENCOUNTER — Telehealth: Payer: Self-pay | Admitting: Cardiology

## 2023-11-15 DIAGNOSIS — I10 Essential (primary) hypertension: Secondary | ICD-10-CM | POA: Diagnosis not present

## 2023-11-15 DIAGNOSIS — E669 Obesity, unspecified: Secondary | ICD-10-CM | POA: Diagnosis not present

## 2023-11-15 DIAGNOSIS — I5032 Chronic diastolic (congestive) heart failure: Secondary | ICD-10-CM | POA: Diagnosis not present

## 2023-11-15 NOTE — Telephone Encounter (Signed)
Lvm for pt appt for 11/16/23

## 2023-11-16 ENCOUNTER — Other Ambulatory Visit
Admission: RE | Admit: 2023-11-16 | Discharge: 2023-11-16 | Disposition: A | Discharge status: Home / Self Care | Payer: PPO | Source: Ambulatory Visit | Attending: Cardiology | Admitting: Cardiology

## 2023-11-16 ENCOUNTER — Ambulatory Visit (HOSPITAL_BASED_OUTPATIENT_CLINIC_OR_DEPARTMENT_OTHER): Payer: PPO | Admitting: Cardiology

## 2023-11-16 ENCOUNTER — Ambulatory Visit: Payer: PPO | Admitting: Cardiology

## 2023-11-16 VITALS — BP 122/70 | HR 65 | Ht 61.0 in | Wt 190.0 lb

## 2023-11-16 DIAGNOSIS — E118 Type 2 diabetes mellitus with unspecified complications: Secondary | ICD-10-CM

## 2023-11-16 DIAGNOSIS — I5032 Chronic diastolic (congestive) heart failure: Secondary | ICD-10-CM | POA: Insufficient documentation

## 2023-11-16 DIAGNOSIS — E854 Organ-limited amyloidosis: Secondary | ICD-10-CM | POA: Diagnosis not present

## 2023-11-16 DIAGNOSIS — G629 Polyneuropathy, unspecified: Secondary | ICD-10-CM

## 2023-11-16 DIAGNOSIS — N1832 Chronic kidney disease, stage 3b: Secondary | ICD-10-CM | POA: Diagnosis not present

## 2023-11-16 DIAGNOSIS — I43 Cardiomyopathy in diseases classified elsewhere: Secondary | ICD-10-CM

## 2023-11-16 DIAGNOSIS — I10 Essential (primary) hypertension: Secondary | ICD-10-CM | POA: Diagnosis not present

## 2023-11-16 LAB — BASIC METABOLIC PANEL
Anion gap: 10 (ref 5–15)
BUN: 36 mg/dL — ABNORMAL HIGH (ref 8–23)
CO2: 27 mmol/L (ref 22–32)
Calcium: 9.6 mg/dL (ref 8.9–10.3)
Chloride: 104 mmol/L (ref 98–111)
Creatinine, Ser: 1.74 mg/dL — ABNORMAL HIGH (ref 0.44–1.00)
GFR, Estimated: 29 mL/min — ABNORMAL LOW (ref >60)
Glucose, Bld: 99 mg/dL (ref 70–99)
Potassium: 4.4 mmol/L (ref 3.5–5.1)
Sodium: 141 mmol/L (ref 135–145)

## 2023-11-16 LAB — BRAIN NATRIURETIC PEPTIDE: B Natriuretic Peptide: 652.8 pg/mL — ABNORMAL HIGH (ref 0.0–100.0)

## 2023-11-16 MED ORDER — CARVEDILOL 6.25 MG PO TABS: 6.2500 mg | ORAL_TABLET | Freq: Two times a day (BID) | ORAL | 3 refills | Status: DC

## 2023-11-16 NOTE — Progress Notes (Signed)
Blood collected for TTR genetic testing per Dr Gasper Lloyd.  Order form completed and both shipped by [facility] to [facility].

## 2023-11-16 NOTE — Progress Notes (Addendum)
ADVANCED HEART FAILURE CLINIC NOTE  Referring Physician: Esperanza Richters, PA-C  Primary Care: Esperanza Richters, PA-C  HPI: Donna Fuller is a 82 y.o. female with HFpEF, hypertension, CKD, type 2 diabetes, hyperlipidemia,  peripheral neuropathy presenting today to establish care.  Her cardiac history dates back to at least 2022 when she presented to [facility] with complaints of substernal chest pressure.  Patient underwent left heart catheterization that demonstrated only mild nonobstructive CAD of the LAD.  Echocardiogram with moderate to severe LVH and grade 2 diastolic dysfunction with LV ejection fraction of 60 to 65%.  Since that time patient reports she has had a continued decline in her functional status.  She was admitted to [facility] in July and December 2024 with acute on chronic HFpEF exacerbations.  In December 2024 she was diuresed with IV Lasix and discharged home on torsemide 10 mg daily.  Since discharge from the hospital, Donna Fuller reports feeling very fatigued. She becomes fatigued with even dressing herself or taking showers. She started taking bumex 1mg  BID yesterday.  She reports having bilateral carpal tunnel syndrome, peripheral neuropathy and low back pain.  She is here today with her 2 daughters.   Past Medical History:  Diagnosis Date   Arthritis    Breast cancer [facility]) 2007   Right breast, s/p mastectomy   Chronic diastolic congestive heart failure (HCC)    per patient, dx by cardiology   Diabetes mellitus without complication (HCC)    GERD (gastroesophageal reflux disease)    Gout    Hyperlipidemia    Hypertension    Spinal stenosis     Current Outpatient Medications  Medication Sig Dispense Refill   acetaminophen (TYLENOL) 650 MG CR tablet Take 650 mg by mouth 2 (two) times daily.     albuterol (VENTOLIN HFA) 108 (90 Base) MCG/ACT inhaler INHALE 2 PUFFS BY MOUTH EVERY 6 HOURS AS NEEDED (Patient taking differently: Inhale 2 puffs into the lungs every 6 (six)  hours as needed for wheezing or shortness of breath.) 8.5 each 2   Alcohol Swabs (ALCOHOL PREP PAD) 70 % PADS Use to check blood sugars (Patient taking differently: 1 each by Other route See admin instructions. Use to check blood sugars) 100 each 3   ascorbic acid (VITAMIN C) 1000 MG tablet Take 1,000 mg by mouth daily.     aspirin EC 81 MG tablet Take 1 tablet (81 mg total) by mouth daily. Swallow whole. 90 tablet 3   Benzocaine-Benzethonium (LANACANE EX) Apply 1 application  topically daily as needed. Roll-on daily as needed for knee pain.     benzonatate (TESSALON) 100 MG capsule Take 1 capsule (100 mg total) by mouth 3 (three) times daily as needed for cough. 30 capsule 0   blood glucose meter kit and supplies KIT Dispense based on patient and insurance preference. Use up to four times daily as directed. (FOR ICD-9 250.00, 250.01). (Patient taking differently: Inject 1 each into the skin as directed. Dispense based on patient and insurance preference. Use up to four times daily as directed. (FOR ICD-9 250.00, 250.01).) 1 each 11   brimonidine (ALPHAGAN) 0.2 % ophthalmic solution Place 1 drop into both eyes 3 (three) times daily.     bumetanide (BUMEX) 1 MG tablet Take 1 mg by mouth daily.     carvedilol (COREG) 12.5 MG tablet Take 1 tablet (12.5 mg total) by mouth 2 (two) times daily with a meal. 180 tablet 3   Cholecalciferol (VITAMIN D-3 PO) Take 2,000 Units  by mouth daily.     Cyanocobalamin (B-12 PO) Take 1 tablet by mouth daily.     Dulaglutide (TRULICITY) 1.5 MG/0.5ML SOAJ INJECT 1.5 MG (0.5ML) UNDER THE SKIN ONCE A WEEK 2 mL 2   ezetimibe (ZETIA) 10 MG tablet Take 1 tablet (10 mg total) by mouth daily. 90 tablet 3   fluticasone (FLONASE) 50 MCG/ACT nasal spray SPRAY 2 SPRAYS INTO EACH NOSTRIL EVERY DAY 48 mL 1   FLUZONE HIGH-DOSE 0.5 ML injection      gabapentin (NEURONTIN) 300 MG capsule Take 1 capsule (300 mg total) by mouth 3 (three) times daily. 270 capsule 1   glipiZIDE (GLUCOTROL) 5  MG tablet Take 1 tablet (5 mg total) by mouth 2 (two) times daily before a meal. 180 tablet 3   ketorolac (ACULAR) 0.5 % ophthalmic solution INSTILL 1 DROP INTO RIGHT EYE 4 TIMES A DAY (Patient taking differently: Place 1 drop into the right eye 4 (four) times daily. INSTILL 1 DROP INTO RIGHT EYE 3 TIMES A DAY) 5 mL 6   latanoprost (XALATAN) 0.005 % ophthalmic solution Place 1 drop into both eyes daily.     levocetirizine (XYZAL) 5 MG tablet TAKE 1 TABLET BY MOUTH EVERY DAY IN THE EVENING 90 tablet 1   losartan (COZAAR) 50 MG tablet TAKE 1 TABLET BY MOUTH EVERY DAY 90 tablet 3   omeprazole (PRILOSEC) 40 MG capsule TAKE 1 CAP 2 TIMES DAILY. PLEASE CALL [phone] TO SCHEDULE AN OFFICE VISIT FOR MORE REFILLS. 180 capsule 0   OneTouch Delica Lancets 33G MISC Use as instructed to check blood sugar 2-3 times a day.  DX E11.9 (Patient taking differently: 1 each by Other route See admin instructions. Use as instructed to check blood sugar 2-3 times a day.  DX E11.9) 100 each 3   ONETOUCH ULTRA TEST test strip CHECK BLOOD SUGAR 2 TO 3 TIMES DAILY AS DIRECTED (Patient taking differently: 1 each by Other route as needed for other.) 100 strip 5   timolol (TIMOPTIC) 0.5 % ophthalmic solution Place 1 drop into both eyes 2 (two) times a day.     No current facility-administered medications for this visit.    Allergies  Allergen Reactions   Penicillins Swelling   Sulfa Antibiotics Swelling   Amlodipine Swelling    LE Swelling   Lisinopril Cough      Social History   Socioeconomic History   Marital status: Married    Spouse name: Jimmy   Number of children: 3   Years of education: 12   Highest education level: Not on file  Occupational History   Occupation: Retired  Tobacco Use   Smoking status: Former    Types: Cigarettes    Passive exposure: Never   Smokeless tobacco: Never  Vaping Use   Vaping status: Never Used  Substance and Sexual Activity   Alcohol use: No   Drug use: No   Sexual  activity: Not Currently  Other Topics Concern   Not on file  Social History Narrative   Marital Status:  Married Hydrologist)    Children:  Daughter(1) Son (1)    Pets:  None    Living Situation: Lives with husband and daughter.     Occupation:  Retired Contractor)    Education: 12th Grade    Tobacco Use/Exposure:  She used to smoke socially during the weekends but quit 40 years ago.    Alcohol Use:  Occasional   Drug Use:  None   Diet:  Regular  Exercise:  None   Hobbies:  Reading and playing volleyball             Social Drivers of Health   Financial Resource Strain: Low Risk  (11/08/2023)   Overall Financial Resource Strain (CARDIA)    Difficulty of Paying Living Expenses: Not hard at all  Food Insecurity: No Food Insecurity (11/09/2023)   Hunger Vital Sign    Worried About Running Out of Food in the Last Year: Never true    Ran Out of Food in the Last Year: Never true  Transportation Needs: No Transportation Needs (11/09/2023)   PRAPARE - Administrator, Civil Service (Medical): No    Lack of Transportation (Non-Medical): No  Physical Activity: Inactive (10/01/2021)   Exercise Vital Sign    Days of Exercise per Week: 0 days    Minutes of Exercise per Session: 0 min  Stress: No Stress Concern Present (10/01/2021)   [employer] of Occupational Health - Occupational Stress Questionnaire    Feeling of Stress : Not at all  Social Connections: Moderately Integrated (10/01/2021)   Social Connection and Isolation Panel [NHANES]    Frequency of Communication with Friends and Family: More than three times a week    Frequency of Social Gatherings with Friends and Family: More than three times a week    Attends Religious Services: More than 4 times per year    Active Member of [employer] or Organizations: No    Attends Banker Meetings: Never    Marital Status: Married  Catering manager Violence: Not At Risk (11/09/2023)   Humiliation, Afraid, Rape, and Kick  questionnaire    Fear of Current or Ex-Partner: No    Emotionally Abused: No    Physically Abused: No    Sexually Abused: No      Family History  Problem Relation Age of Onset   Diabetes Mother    Alzheimer's disease Mother    Hypertension Mother    Hypertension Father    Heart disease Father    Hyperlipidemia Father    Kidney disease Father    Diabetes Sister    Cancer Brother        lung   Alzheimer's disease Brother    Heart disease Brother    Hypertension Brother    Diabetes Brother    Hypertension Brother    Diabetes Brother    Hyperlipidemia Son    Hypertension Son    Diabetes Son    Hypertension Daughter    Hyperlipidemia Daughter     PHYSICAL EXAM: Vitals:   11/16/23 1123  BP: 122/70  Pulse: 65  SpO2: 100%   GENERAL: Elderly female in no apparent distress HEENT: Negative for arcus senilis or xanthelasma. There is no scleral icterus.  The mucous membranes are pink and moist.   NECK: Supple, No masses. Normal carotid upstrokes without bruits. No masses or thyromegaly.    CHEST: There are no chest wall deformities. There is no chest wall tenderness. Respirations are unlabored.  Lungs-decreased at bases CARDIAC:  JVP: 10-12 cm H2O         Normal S1, S2  Normal rate with regular rhythm. No murmurs, rubs or gallops.  Pulses are 2+ and symmetrical in upper and lower extremities.  1-2+ pitting edema to the knees ABDOMEN: Soft, non-tender, non-distended. There are no masses or hepatomegaly. There are normal bowel sounds.  EXTREMITIES: Warm and well perfused with no cyanosis, clubbing.  LYMPHATIC: No axillary or supraclavicular lymphadenopathy.  NEUROLOGIC:  Patient is oriented x3 with no focal or lateralizing neurologic deficits.  PSYCH: Patients affect is appropriate, there is no evidence of anxiety or depression.  SKIN: Warm and dry; no lesions or wounds.   DATA REVIEW  ECG: 11/08/23: Normal sinus rhythm and first-degree AV block as per my personal  interpretation  ECHO: 11/07/23: LVEF 60 to 65% with severe LVH and grade 3 diastolic dysfunction as per my personal interpretation  CATH: [facility], 2022:    Mid LAD lesion is 20% stenosed.   LV end diastolic pressure is mildly elevated.   1.  Mild nonobstructive coronary artery disease. 2.  Left ventricular angiography was not performed.  EF was normal by echo. 3.  Mildly elevated left ventricular end-diastolic pressure at 18 mmHg.   ASSESSMENT & PLAN:  Evauation for cardiac amyloid  - TTE with restrictive features; severe LVH and myocardium that appears to be secondary to possible cardiac amyloidosis.  - Obtain myeloma panel today.  - Schedule PYP scan - Genetic testing today.  - Hypervolemic on exam; will continue bumex 1mg  BID.  - Repeat BMP/BNP today.  - Discussed extensively with both daughters and patient today.   2. HFpEF - possibly secondary to cardiac amyloid.  - Hypervolemic on exam; diuretics escalated yesterday from torsemide 10mg  daily to bumex 1mg  BID. Agree with higher dose.  - Will continue bumex; plan BMP/BNP today and repeat next week with close follow up to assess volume status.  - Plan to start finerenone at follow up. - reduce coreg to 6.25mg  BID; HR 65, rate dependent in the setting of restrictive physiology. Will plan on discontinuing eventually.   3. Peripheral neuropathy - Reports mild sensory deficits in her fingers with carpal tunnel like symptoms.  - Her A1C has ranged from 6 to 6.8 since 2022. I suspect these changes may be secondary to underlying amyloid. Testing ordered & pending.   4. T2DM - A1C from 6.8 to 6 ranging from 2022 to 2024 - Continue trulicity - will plan to start SGLT2i at follow up.   5. Hypertension  - continue losartan 50mg  daily  6. CKD - repeat labs today  I spent 45 minutes caring for this patient today including face to face time, ordering and reviewing labs, reviewing records from hospitalizations in 2022 & 12/24,  discussing genetic testing and impact of amyloid on her and family, seeing the patient, documenting in the record, and arranging follow ups.   Onda Kattner Advanced Heart Failure Mechanical Circulatory Support

## 2023-11-16 NOTE — Patient Instructions (Addendum)
Medication Changes:  START: CARVEDILOL 6.25MG  TWICE DAILY   Lab Work:  Go over to the [facility]ALL. Go pass the gift shop and have your blood work completed.  We will only call you if the results are abnormal or if the provider would like to make medication changes.   Genetic test has been done, this has to be sent to [place] to be processed and can take 1-2 weeks to get results back.  We will let you know the results.   Testing/Procedures:  You have been ordered a PYP Scan.  This is done at [facility], they will call you to schedule.  When you come for this test please plan to be there 2-3 hours.  They are located at: [address] [place], [place] [address] SOMEONE WILL CALL YOU TO GET YOU SCHEDULED FOR THIS ONCE APPROVED WITH INSURANCE   Follow-Up in: 2 weeks as scheduled   Then again in 1 month as scheduled   If you have any questions or concerns before your next appointment please send [place] a message through mychart or call our office at [phone] Monday-Friday 8 am-5 pm.   If you have an urgent need after hours on the weekend please call your Primary Cardiologist or the [facility] in [place] at [phone].   At the [facility], you and your health needs are our priority. We have a designated team specialized in the treatment of Heart Failure. This Care Team includes your primary Heart Failure Specialized Cardiologist (physician), Advanced Practice Providers (APPs- Physician Assistants and Nurse Practitioners), and Pharmacist who all work together to provide you with the care you need, when you need it.   You may see any of the following providers on your designated Care Team at your next follow up:  Dr. Arvilla Meres Dr. Marca Ancona Dr. Dorthula Nettles Dr. Theresia Bough Tonye Becket, NP Robbie Lis, [place] [address] [place], [place] Brynda Peon, NP [place] Lee, NP Clarisa Kindred, NP Enos Fling, PharmD

## 2023-11-17 ENCOUNTER — Telehealth: Payer: Self-pay

## 2023-11-17 DIAGNOSIS — I5032 Chronic diastolic (congestive) heart failure: Secondary | ICD-10-CM

## 2023-11-17 DIAGNOSIS — C9000 Multiple myeloma not having achieved remission: Secondary | ICD-10-CM

## 2023-11-17 LAB — KAPPA/LAMBDA LIGHT CHAINS
Kappa free light chain: 80.4 mg/L — ABNORMAL HIGH (ref 3.3–19.4)
Kappa, lambda light chain ratio: 1.4 (ref 0.26–1.65)
Lambda free light chains: 57.3 mg/L — ABNORMAL HIGH (ref 5.7–26.3)

## 2023-11-17 NOTE — Telephone Encounter (Signed)
Pt aware, agreeable, and verbalized understanding to come to the [facility] for repeat lab work on 12/23.  BMET order placed.

## 2023-11-17 NOTE — Telephone Encounter (Signed)
-----   Message from Nurse Emer C sent at 11/16/2023  2:11 PM EST -----  ----- Message ----- From: Dorthula Nettles, DO Sent: 11/16/2023   1:56 PM EST To: [facility] Triage Pool  Can we get a repeat BMP on her Monday next week.   Adi

## 2023-11-22 ENCOUNTER — Other Ambulatory Visit
Admission: RE | Admit: 2023-11-22 | Discharge: 2023-11-22 | Disposition: A | Discharge status: Home / Self Care | Payer: PPO | Attending: Cardiology | Admitting: Cardiology

## 2023-11-22 DIAGNOSIS — I5032 Chronic diastolic (congestive) heart failure: Secondary | ICD-10-CM | POA: Diagnosis not present

## 2023-11-22 LAB — BASIC METABOLIC PANEL
Anion gap: 10 (ref 5–15)
BUN: 36 mg/dL — ABNORMAL HIGH (ref 8–23)
CO2: 26 mmol/L (ref 22–32)
Calcium: 8.9 mg/dL (ref 8.9–10.3)
Chloride: 102 mmol/L (ref 98–111)
Creatinine, Ser: 1.65 mg/dL — ABNORMAL HIGH (ref 0.44–1.00)
GFR, Estimated: 31 mL/min — ABNORMAL LOW (ref >60)
Glucose, Bld: 163 mg/dL — ABNORMAL HIGH (ref 70–99)
Potassium: 4.4 mmol/L (ref 3.5–5.1)
Sodium: 138 mmol/L (ref 135–145)

## 2023-11-28 LAB — MULTIPLE MYELOMA PANEL, SERUM
Albumin SerPl Elph-Mcnc: 3.8 g/dL (ref 2.9–4.4)
Albumin/Glob SerPl: 1.2 (ref 0.7–1.7)
Alpha 1: 0.3 g/dL (ref 0.0–0.4)
Alpha2 Glob SerPl Elph-Mcnc: 0.7 g/dL (ref 0.4–1.0)
B-Globulin SerPl Elph-Mcnc: 1.2 g/dL (ref 0.7–1.3)
Gamma Glob SerPl Elph-Mcnc: 1.2 g/dL (ref 0.4–1.8)
Globulin, Total: 3.3 g/dL (ref 2.2–3.9)
IgA: 378 mg/dL (ref 64–422)
IgG (Immunoglobin G), Serum: 1369 mg/dL (ref 586–1602)
IgM (Immunoglobulin M), Srm: 112 mg/dL (ref 26–217)
Total Protein ELP: 7.1 g/dL (ref 6.0–8.5)

## 2023-11-30 ENCOUNTER — Encounter: Payer: Self-pay | Admitting: Family

## 2023-11-30 ENCOUNTER — Ambulatory Visit: Payer: PPO | Attending: Family | Admitting: Family

## 2023-11-30 VITALS — BP 115/60 | HR 67 | Wt 185.0 lb

## 2023-11-30 DIAGNOSIS — Z7984 Long term (current) use of oral hypoglycemic drugs: Secondary | ICD-10-CM | POA: Diagnosis not present

## 2023-11-30 DIAGNOSIS — I5032 Chronic diastolic (congestive) heart failure: Secondary | ICD-10-CM | POA: Insufficient documentation

## 2023-11-30 DIAGNOSIS — N189 Chronic kidney disease, unspecified: Secondary | ICD-10-CM | POA: Diagnosis not present

## 2023-11-30 DIAGNOSIS — E854 Organ-limited amyloidosis: Secondary | ICD-10-CM | POA: Diagnosis not present

## 2023-11-30 DIAGNOSIS — G629 Polyneuropathy, unspecified: Secondary | ICD-10-CM | POA: Diagnosis not present

## 2023-11-30 DIAGNOSIS — I43 Cardiomyopathy in diseases classified elsewhere: Secondary | ICD-10-CM

## 2023-11-30 DIAGNOSIS — I272 Pulmonary hypertension, unspecified: Secondary | ICD-10-CM | POA: Diagnosis not present

## 2023-11-30 DIAGNOSIS — E1140 Type 2 diabetes mellitus with diabetic neuropathy, unspecified: Secondary | ICD-10-CM | POA: Insufficient documentation

## 2023-11-30 DIAGNOSIS — Z87891 Personal history of nicotine dependence: Secondary | ICD-10-CM | POA: Insufficient documentation

## 2023-11-30 DIAGNOSIS — E785 Hyperlipidemia, unspecified: Secondary | ICD-10-CM | POA: Diagnosis not present

## 2023-11-30 DIAGNOSIS — E1122 Type 2 diabetes mellitus with diabetic chronic kidney disease: Secondary | ICD-10-CM | POA: Insufficient documentation

## 2023-11-30 DIAGNOSIS — E118 Type 2 diabetes mellitus with unspecified complications: Secondary | ICD-10-CM

## 2023-11-30 DIAGNOSIS — I2510 Atherosclerotic heart disease of native coronary artery without angina pectoris: Secondary | ICD-10-CM | POA: Diagnosis not present

## 2023-11-30 DIAGNOSIS — I10 Essential (primary) hypertension: Secondary | ICD-10-CM

## 2023-11-30 DIAGNOSIS — I130 Hypertensive heart and chronic kidney disease with heart failure and stage 1 through stage 4 chronic kidney disease, or unspecified chronic kidney disease: Secondary | ICD-10-CM | POA: Diagnosis not present

## 2023-11-30 DIAGNOSIS — R5383 Other fatigue: Secondary | ICD-10-CM | POA: Diagnosis present

## 2023-11-30 MED ORDER — EMPAGLIFLOZIN 10 MG PO TABS
10.0000 mg | ORAL_TABLET | Freq: Every day | ORAL | 5 refills | Status: DC
Start: 2023-11-30 — End: 2023-12-23

## 2023-11-30 NOTE — Patient Instructions (Addendum)
Begin jardiance 10mg  once a day.

## 2023-11-30 NOTE — Progress Notes (Signed)
 ADVANCED HEART FAILURE CLINIC NOTE   Primary Care: Saguier, Edward, PA-C (last seen 12/24) Cardiology: Wilburn Fillers, MD (last seen 12/24) HF Provider: Gardenia Led, MD (last seen 12/24; returns 01/25)  Chief Complaint: fatigue   HPI: Donna Fuller is a 82 y.o. female with HFpEF, hypertension, CKD, type 2 diabetes, hyperlipidemia,  peripheral neuropathy, pulmonary HTN and nonobstructive CAD based on 2022 cath.   Her cardiac history dates back to at least 2022 when she presented to [facility] with complaints of substernal chest pressure.  Patient underwent left heart catheterization that demonstrated only mild nonobstructive CAD of the LAD.  Echocardiogram with moderate to severe LVH and grade 2 diastolic dysfunction with LV ejection fraction of 60 to 65%.  Since that time patient reports she has had a continued decline in her functional status.  She was admitted to [facility] in July and December 2024 with acute on chronic HFpEF exacerbations.  In December 2024 she was diuresed with IV Lasix  and discharged home on torsemide  10 mg daily.  She presents today for a HF follow-up visit with a chief complaint of moderate fatigue with little exertion. Chronic in nature. Has associated minimal shortness of breath which has improved since taking bumex . Has occasional chest tightness, palpitations, dry cough, dizziness today and intermittent trouble sleeping as well. Denies chest pain, wheezing, abdominal distention, pedal edema or weight gain. Since taking bumex , she has lost 6 pounds by her home weight log. She reports having bilateral carpal tunnel syndrome, peripheral neuropathy and low back pain (improving).  She is here today with her husband.  She says that she lost her taste / smell since right before Christmas. Denies fever or being around anyone with covid.   Past Medical History:  Diagnosis Date   Arthritis    Breast cancer [facility]) 2007   Right breast, s/p mastectomy   Chronic diastolic  congestive heart failure (HCC)    per patient, dx by cardiology   Diabetes mellitus without complication (HCC)    GERD (gastroesophageal reflux disease)    Gout    Hyperlipidemia    Hypertension    Spinal stenosis     Current Outpatient Medications  Medication Sig Dispense Refill   acetaminophen  (TYLENOL ) 650 MG CR tablet Take 650 mg by mouth 2 (two) times daily.     albuterol  (VENTOLIN  HFA) 108 (90 Base) MCG/ACT inhaler INHALE 2 PUFFS BY MOUTH EVERY 6 HOURS AS NEEDED (Patient taking differently: Inhale 2 puffs into the lungs every 6 (six) hours as needed for wheezing or shortness of breath.) 8.5 each 2   Alcohol  Swabs (ALCOHOL  PREP PAD) 70 % PADS Use to check blood sugars (Patient taking differently: 1 each by Other route See admin instructions. Use to check blood sugars) 100 each 3   ascorbic acid (VITAMIN C) 1000 MG tablet Take 1,000 mg by mouth daily.     aspirin  EC 81 MG tablet Take 1 tablet (81 mg total) by mouth daily. Swallow whole. 90 tablet 3   Benzocaine-Benzethonium (LANACANE EX) Apply 1 application  topically daily as needed. Roll-on daily as needed for knee pain.     benzonatate  (TESSALON ) 100 MG capsule Take 1 capsule (100 mg total) by mouth 3 (three) times daily as needed for cough. 30 capsule 0   blood glucose meter kit and supplies KIT Dispense based on patient and insurance preference. Use up to four times daily as directed. (FOR ICD-9 250.00, 250.01). (Patient taking differently: Inject 1 each into the skin as directed. Dispense  based on patient and insurance preference. Use up to four times daily as directed. (FOR ICD-9 250.00, 250.01).) 1 each 11   brimonidine  (ALPHAGAN ) 0.2 % ophthalmic solution Place 1 drop into both eyes 3 (three) times daily.     bumetanide  (BUMEX ) 1 MG tablet Take 1 mg by mouth daily.     carvedilol  (COREG ) 6.25 MG tablet Take 1 tablet (6.25 mg total) by mouth 2 (two) times daily with a meal. 60 tablet 3   Cholecalciferol (VITAMIN D-3 PO) Take 2,000  Units by mouth daily.     Cyanocobalamin  (B-12 PO) Take 1 tablet by mouth daily.     Dulaglutide  (TRULICITY ) 1.5 MG/0.5ML SOAJ INJECT 1.5 MG (0.5ML) UNDER THE SKIN ONCE A WEEK 2 mL 2   ezetimibe  (ZETIA ) 10 MG tablet Take 1 tablet (10 mg total) by mouth daily. 90 tablet 3   fluticasone  (FLONASE ) 50 MCG/ACT nasal spray SPRAY 2 SPRAYS INTO EACH NOSTRIL EVERY DAY 48 mL 1   FLUZONE HIGH-DOSE 0.5 ML injection      gabapentin  (NEURONTIN ) 300 MG capsule Take 1 capsule (300 mg total) by mouth 3 (three) times daily. 270 capsule 1   glipiZIDE  (GLUCOTROL ) 5 MG tablet Take 1 tablet (5 mg total) by mouth 2 (two) times daily before a meal. 180 tablet 3   ketorolac  (ACULAR ) 0.5 % ophthalmic solution INSTILL 1 DROP INTO RIGHT EYE 4 TIMES A DAY (Patient taking differently: Place 1 drop into the right eye 4 (four) times daily. INSTILL 1 DROP INTO RIGHT EYE 3 TIMES A DAY) 5 mL 6   latanoprost  (XALATAN ) 0.005 % ophthalmic solution Place 1 drop into both eyes daily.     levocetirizine (XYZAL ) 5 MG tablet TAKE 1 TABLET BY MOUTH EVERY DAY IN THE EVENING 90 tablet 1   losartan  (COZAAR ) 50 MG tablet TAKE 1 TABLET BY MOUTH EVERY DAY 90 tablet 3   omeprazole  (PRILOSEC) 40 MG capsule TAKE 1 CAP 2 TIMES DAILY. PLEASE CALL [phone] TO SCHEDULE AN OFFICE VISIT FOR MORE REFILLS. 180 capsule 0   OneTouch Delica Lancets 33G MISC Use as instructed to check blood sugar 2-3 times a day.  DX E11.9 (Patient taking differently: 1 each by Other route See admin instructions. Use as instructed to check blood sugar 2-3 times a day.  DX E11.9) 100 each 3   ONETOUCH ULTRA TEST test strip CHECK BLOOD SUGAR 2 TO 3 TIMES DAILY AS DIRECTED (Patient taking differently: 1 each by Other route as needed for other.) 100 strip 5   timolol  (TIMOPTIC ) 0.5 % ophthalmic solution Place 1 drop into both eyes 2 (two) times a day.     No current facility-administered medications for this visit.    Allergies  Allergen Reactions   Penicillins Swelling    Sulfa Antibiotics Swelling   Amlodipine Swelling    LE Swelling   Lisinopril Cough      Social History   Socioeconomic History   Marital status: Married    Spouse name: Jimmy   Number of children: 3   Years of education: 12   Highest education level: Not on file  Occupational History   Occupation: Retired  Tobacco Use   Smoking status: Former    Types: Cigarettes    Passive exposure: Never   Smokeless tobacco: Never  Vaping Use   Vaping status: Never Used  Substance and Sexual Activity   Alcohol  use: No   Drug use: No   Sexual activity: Not Currently  Other Topics Concern  Not on file  Social History Narrative   Marital Status:  Married Hydrologist)    Children:  Daughter(1) Son (1)    Pets:  None    Living Situation: Lives with husband and daughter.     Occupation:  Retired CONTRACTOR)    Education: 12th Grade    Tobacco Use/Exposure:  She used to smoke socially during the weekends but quit 40 years ago.    Alcohol  Use:  Occasional   Drug Use:  None   Diet:  Regular   Exercise:  None   Hobbies:  Reading and playing volleyball             Social Drivers of Health   Financial Resource Strain: Low Risk  (11/08/2023)   Overall Financial Resource Strain (CARDIA)    Difficulty of Paying Living Expenses: Not hard at all  Food Insecurity: No Food Insecurity (11/09/2023)   Hunger Vital Sign    Worried About Running Out of Food in the Last Year: Never true    Ran Out of Food in the Last Year: Never true  Transportation Needs: No Transportation Needs (11/09/2023)   PRAPARE - Administrator, Civil Service (Medical): No    Lack of Transportation (Non-Medical): No  Physical Activity: Inactive (10/01/2021)   Exercise Vital Sign    Days of Exercise per Week: 0 days    Minutes of Exercise per Session: 0 min  Stress: No Stress Concern Present (10/01/2021)   [employer] of Occupational Health - Occupational Stress Questionnaire    Feeling of Stress : Not at all   Social Connections: Moderately Integrated (10/01/2021)   Social Connection and Isolation Panel [NHANES]    Frequency of Communication with Friends and Family: More than three times a week    Frequency of Social Gatherings with Friends and Family: More than three times a week    Attends Religious Services: More than 4 times per year    Active Member of [employer] or Organizations: No    Attends Banker Meetings: Never    Marital Status: Married  Catering Manager Violence: Not At Risk (11/09/2023)   Humiliation, Afraid, Rape, and Kick questionnaire    Fear of Current or Ex-Partner: No    Emotionally Abused: No    Physically Abused: No    Sexually Abused: No      Family History  Problem Relation Age of Onset   Diabetes Mother    Alzheimer's disease Mother    Hypertension Mother    Hypertension Father    Heart disease Father    Hyperlipidemia Father    Kidney disease Father    Diabetes Sister    Cancer Brother        lung   Alzheimer's disease Brother    Heart disease Brother    Hypertension Brother    Diabetes Brother    Hypertension Brother    Diabetes Brother    Hyperlipidemia Son    Hypertension Son    Diabetes Son    Hypertension Daughter    Hyperlipidemia Daughter    Vitals:   11/30/23 1323  BP: 115/60  Pulse: 67  SpO2: 95%  Weight: 185 lb (83.9 kg)   Wt Readings from Last 3 Encounters:  11/30/23 185 lb (83.9 kg)  11/16/23 190 lb (86.2 kg)  11/09/23 189 lb (85.7 kg)   Lab Results  Component Value Date   CREATININE 1.65 (H) 11/22/2023   CREATININE 1.74 (H) 11/16/2023   CREATININE 1.49 (H) 11/09/2023  PHYSICAL EXAM:  GENERAL: Elderly female in no apparent distress HEENT: Negative for arcus senilis or xanthelasma. The mucous membranes are pink and moist.   NECK: Supple, No masses. Normal carotid upstrokes without bruits. No masses or thyromegaly.    CHEST: There are no chest wall deformities. There is no chest wall tenderness. Respirations  are unlabored.  Lungs are clear CARDIAC:  JVP: negative         Normal S1, S2  Normal rate with regular rhythm. No murmurs, rubs or gallops.  Pulses are 2+ and symmetrical in upper and lower extremities.  Trace pitting bilaterally to mid-shin ABDOMEN: Soft, non-tender, non-distended. There are no masses or hepatomegaly.  EXTREMITIES: Warm and well perfused with no cyanosis, clubbing.  LYMPHATIC: No axillary or supraclavicular lymphadenopathy.  NEUROLOGIC: Patient is oriented x 3 with no focal or lateralizing neurologic deficits.  PSYCH: Patients affect is appropriate, there is no evidence of anxiety or depression.  SKIN: Warm and dry; no lesions or wounds.   DATA REVIEW  ECG: 11/08/23: Normal sinus rhythm and first-degree AV block as per my personal interpretation  ECHO: 11/07/23: LVEF 60 to 65% with severe LVH and grade 3 diastolic dysfunction as per my personal interpretation  CATH: [facility], 2022:    Mid LAD lesion is 20% stenosed.   LV end diastolic pressure is mildly elevated.  1.  Mild nonobstructive coronary artery disease. 2.  Left ventricular angiography was not performed.  EF was normal by echo. 3.  Mildly elevated left ventricular end-diastolic pressure at 18 mmHg.   ASSESSMENT & PLAN:  Evauation for cardiac amyloid  - TTE with restrictive features; severe LVH and myocardium that appears to be secondary to possible cardiac amyloidosis.  - myeloma panel done 11/16/23 - PYP scan scheduled for 12/13/23 - Genetic testing done 11/16/23 - lambda & kappa light chains elevated - hematology referral placed and patient is waiting to hear from them - continue bumex  1mg  BID.  - weight down 5 pounds from last visit here 2 weeks ago - BNP 11/16/23 was 652.8  2. HFpEF - NYHA III - possibly secondary to cardiac amyloid.  - begin jardiance  10mg  daily; 30 day voucher provided - BMET next visit - discussed starting finerenone at next visit - continue coreg  6.25mg  BID; HR 67, rate  dependent in the setting of restrictive physiology. Discussed discontinuing eventually.   3. Peripheral neuropathy - Reports mild sensory deficits in her fingers with carpal tunnel like symptoms.  - Her A1C has ranged from 6 to 6.8 since 2022. I suspect these changes may be secondary to underlying amyloid.    4. T2DM - A1C from 6.8 to 6 ranging from 2022 to 2024 - Continue trulicity  - saw endocrinology [facility]) 11/24 - begin jardiance  10mg  daily   5. Hypertension  - BP 115/60 - saw PCP (Saguier) 12/24 - saw nephrology (Kolluru) 11/24 - continue losartan  50mg  daily - BMET 11/22/23 showed sodium 138, potassium 4.4, creatinine 1.65 & GFR 31 - BMET next visit since starting jardiance    Return in 2 weeks, sooner if needed.

## 2023-12-03 NOTE — Addendum Note (Signed)
 Addended by: Bea Laura B on: 12/03/2023 12:24 PM   Modules accepted: Orders

## 2023-12-03 NOTE — Telephone Encounter (Signed)
 Result Note Attempted to call patient, left message for patient to call back to office. November 28, 2023 Donna Led, DO Regarding results: 2  AS   11/28/23  4:16 PM Result Note Can we please refer her to hematology, expedited, for evaluation of multiple myeloma. Adi   Spoke with patient regarding the following results. Patient made aware and patient verbalized understanding.    Referral placed.

## 2023-12-07 DIAGNOSIS — H401113 Primary open-angle glaucoma, right eye, severe stage: Secondary | ICD-10-CM | POA: Diagnosis not present

## 2023-12-07 DIAGNOSIS — H401122 Primary open-angle glaucoma, left eye, moderate stage: Secondary | ICD-10-CM | POA: Diagnosis not present

## 2023-12-07 DIAGNOSIS — E113393 Type 2 diabetes mellitus with moderate nonproliferative diabetic retinopathy without macular edema, bilateral: Secondary | ICD-10-CM | POA: Diagnosis not present

## 2023-12-07 DIAGNOSIS — H35351 Cystoid macular degeneration, right eye: Secondary | ICD-10-CM | POA: Diagnosis not present

## 2023-12-07 DIAGNOSIS — H04123 Dry eye syndrome of bilateral lacrimal glands: Secondary | ICD-10-CM | POA: Diagnosis not present

## 2023-12-08 ENCOUNTER — Telehealth: Payer: Self-pay | Admitting: *Deleted

## 2023-12-08 NOTE — Telephone Encounter (Signed)
 I reached out to Donna Fuller regarding her new patient appointment with Dr. Mervyn Skeeters for tomorrow. Clinical team introduced to patient. Discussed location/time of appointment with patient. Pt appreciative of telephone call.

## 2023-12-09 ENCOUNTER — Encounter: Payer: Self-pay | Admitting: Internal Medicine

## 2023-12-09 ENCOUNTER — Inpatient Hospital Stay: Payer: PPO | Attending: Internal Medicine | Admitting: Internal Medicine

## 2023-12-09 ENCOUNTER — Telehealth (HOSPITAL_COMMUNITY): Payer: Self-pay | Admitting: *Deleted

## 2023-12-09 ENCOUNTER — Telehealth: Payer: Self-pay | Admitting: *Deleted

## 2023-12-09 ENCOUNTER — Inpatient Hospital Stay: Payer: PPO

## 2023-12-09 VITALS — BP 118/60 | HR 69 | Temp 98.0°F | Resp 16 | Wt 185.6 lb

## 2023-12-09 DIAGNOSIS — Z8349 Family history of other endocrine, nutritional and metabolic diseases: Secondary | ICD-10-CM

## 2023-12-09 DIAGNOSIS — I43 Cardiomyopathy in diseases classified elsewhere: Secondary | ICD-10-CM

## 2023-12-09 DIAGNOSIS — Z888 Allergy status to other drugs, medicaments and biological substances status: Secondary | ICD-10-CM | POA: Diagnosis not present

## 2023-12-09 DIAGNOSIS — Z801 Family history of malignant neoplasm of trachea, bronchus and lung: Secondary | ICD-10-CM

## 2023-12-09 DIAGNOSIS — I10 Essential (primary) hypertension: Secondary | ICD-10-CM | POA: Diagnosis not present

## 2023-12-09 DIAGNOSIS — Z833 Family history of diabetes mellitus: Secondary | ICD-10-CM

## 2023-12-09 DIAGNOSIS — Z853 Personal history of malignant neoplasm of breast: Secondary | ICD-10-CM | POA: Diagnosis not present

## 2023-12-09 DIAGNOSIS — M109 Gout, unspecified: Secondary | ICD-10-CM

## 2023-12-09 DIAGNOSIS — K219 Gastro-esophageal reflux disease without esophagitis: Secondary | ICD-10-CM | POA: Diagnosis not present

## 2023-12-09 DIAGNOSIS — E785 Hyperlipidemia, unspecified: Secondary | ICD-10-CM

## 2023-12-09 DIAGNOSIS — Z841 Family history of disorders of kidney and ureter: Secondary | ICD-10-CM | POA: Insufficient documentation

## 2023-12-09 DIAGNOSIS — I5032 Chronic diastolic (congestive) heart failure: Secondary | ICD-10-CM | POA: Diagnosis not present

## 2023-12-09 DIAGNOSIS — E119 Type 2 diabetes mellitus without complications: Secondary | ICD-10-CM | POA: Diagnosis not present

## 2023-12-09 DIAGNOSIS — Z90710 Acquired absence of both cervix and uterus: Secondary | ICD-10-CM | POA: Insufficient documentation

## 2023-12-09 DIAGNOSIS — R1900 Intra-abdominal and pelvic swelling, mass and lump, unspecified site: Secondary | ICD-10-CM | POA: Diagnosis not present

## 2023-12-09 DIAGNOSIS — Z8249 Family history of ischemic heart disease and other diseases of the circulatory system: Secondary | ICD-10-CM | POA: Diagnosis not present

## 2023-12-09 DIAGNOSIS — Z882 Allergy status to sulfonamides status: Secondary | ICD-10-CM | POA: Insufficient documentation

## 2023-12-09 DIAGNOSIS — Z818 Family history of other mental and behavioral disorders: Secondary | ICD-10-CM

## 2023-12-09 DIAGNOSIS — Z83438 Family history of other disorder of lipoprotein metabolism and other lipidemia: Secondary | ICD-10-CM | POA: Diagnosis not present

## 2023-12-09 DIAGNOSIS — Z880 Allergy status to penicillin: Secondary | ICD-10-CM | POA: Insufficient documentation

## 2023-12-09 DIAGNOSIS — N1832 Chronic kidney disease, stage 3b: Secondary | ICD-10-CM | POA: Diagnosis not present

## 2023-12-09 DIAGNOSIS — Z79899 Other long term (current) drug therapy: Secondary | ICD-10-CM | POA: Diagnosis not present

## 2023-12-09 DIAGNOSIS — E1122 Type 2 diabetes mellitus with diabetic chronic kidney disease: Secondary | ICD-10-CM | POA: Diagnosis not present

## 2023-12-09 DIAGNOSIS — E854 Organ-limited amyloidosis: Secondary | ICD-10-CM

## 2023-12-09 DIAGNOSIS — I517 Cardiomegaly: Secondary | ICD-10-CM

## 2023-12-09 DIAGNOSIS — I130 Hypertensive heart and chronic kidney disease with heart failure and stage 1 through stage 4 chronic kidney disease, or unspecified chronic kidney disease: Secondary | ICD-10-CM | POA: Insufficient documentation

## 2023-12-09 DIAGNOSIS — Z87891 Personal history of nicotine dependence: Secondary | ICD-10-CM | POA: Diagnosis not present

## 2023-12-09 LAB — COMPREHENSIVE METABOLIC PANEL
ALT: 15 U/L (ref 0–44)
AST: 19 U/L (ref 15–41)
Albumin: 4.2 g/dL (ref 3.5–5.0)
Alkaline Phosphatase: 134 U/L — ABNORMAL HIGH (ref 38–126)
Anion gap: 12 (ref 5–15)
BUN: 43 mg/dL — ABNORMAL HIGH (ref 8–23)
CO2: 25 mmol/L (ref 22–32)
Calcium: 9.2 mg/dL (ref 8.9–10.3)
Chloride: 102 mmol/L (ref 98–111)
Creatinine, Ser: 1.71 mg/dL — ABNORMAL HIGH (ref 0.44–1.00)
GFR, Estimated: 30 mL/min — ABNORMAL LOW (ref >60)
Glucose, Bld: 121 mg/dL — ABNORMAL HIGH (ref 70–99)
Potassium: 4 mmol/L (ref 3.5–5.1)
Sodium: 139 mmol/L (ref 135–145)
Total Bilirubin: 0.9 mg/dL (ref 0.0–1.2)
Total Protein: 8.1 g/dL (ref 6.5–8.1)

## 2023-12-09 LAB — CBC WITH DIFFERENTIAL/PLATELET
Abs Immature Granulocytes: 0.02 10*3/uL (ref 0.00–0.07)
Basophils Absolute: 0 10*3/uL (ref 0.0–0.1)
Basophils Relative: 1 %
Eosinophils Absolute: 0.1 10*3/uL (ref 0.0–0.5)
Eosinophils Relative: 1 %
HCT: 40 % (ref 36.0–46.0)
Hemoglobin: 13.1 g/dL (ref 12.0–15.0)
Immature Granulocytes: 0 %
Lymphocytes Relative: 33 %
Lymphs Abs: 2.2 10*3/uL (ref 0.7–4.0)
MCH: 30.2 pg (ref 26.0–34.0)
MCHC: 32.8 g/dL (ref 30.0–36.0)
MCV: 92.2 fL (ref 80.0–100.0)
Monocytes Absolute: 0.4 10*3/uL (ref 0.1–1.0)
Monocytes Relative: 7 %
Neutro Abs: 3.8 10*3/uL (ref 1.7–7.7)
Neutrophils Relative %: 58 %
Platelets: 151 10*3/uL (ref 150–400)
RBC: 4.34 MIL/uL (ref 3.87–5.11)
RDW: 15.1 % (ref 11.5–15.5)
WBC: 6.6 10*3/uL (ref 4.0–10.5)
nRBC: 0 % (ref 0.0–0.2)

## 2023-12-09 NOTE — Telephone Encounter (Signed)
 Left message to remind patient of upcoming amyloid study on 12/13/23 at 12:30

## 2023-12-09 NOTE — Telephone Encounter (Signed)
 Msg sent to [facility] in IR radiology scheduling to sch u/s guided soft tissue bx of abdomen. Bx fatty tissue in abdomen concern for amyloidosis.

## 2023-12-09 NOTE — Progress Notes (Signed)
 She has been having severe back pain since 2005. Always stays tired, she can't even change the sheets on her bed. Her appetite has been up and down.   Headaches on both sides of her head, and stomach has been feeling like it is bloated and sometimes it feels just painful. The right lip of her vagina had a cyst and it burst and ever since her right groin area radiating down to her right knee has been bothering her really bad to were she can't even move the leg on her own, she has to get help from her husband.

## 2023-12-10 DIAGNOSIS — I517 Cardiomegaly: Secondary | ICD-10-CM | POA: Insufficient documentation

## 2023-12-10 NOTE — Progress Notes (Signed)
 [facility] CONSULT NOTE  Patient Care Team: Saguier, [place] RIGGERS as PCP - General (Internal Medicine) Perla Evalene PARAS, MD as PCP - Cardiology (Cardiology) Dolphus Reiter, MD as Consulting Physician (Rheumatology) Chick Venetia BRAVO, MD as Consulting Physician (Family Medicine) Dyanara Cozza, MD as Consulting Physician (Oncology)  REFERRING PROVIDER: Dr. Gardenia  REASON FOR REFFERAL: Concern for cardiac amyloidosis  CANCER STAGING   Cancer Staging  No matching staging information was found for the patient.   ASSESSMENT & PLAN:  Donna Fuller 83 y.o. female with pmh of right breast cancer status postmastectomy, chronic diastolic CHF, diabetes, GERD, gout, hyperlipidemia, hypertension referred to hematology for workup of amyloidosis.  # Left ventricular hypertrophy with restrictive filling, concern for amyloidosis - Patient follows with Dr. Gardenia, cardiology for diastolic CHF. Echocardiogram from 11/07/2023 showed EF of 55 to 60%.  No regional wall abnormality.  Moderate concentric LVH, grade 3 diastolic dysfunction.  LV has appearance of possible amyloid with moderate to severe LVH and restrictive filling.  Right ventricle systolic function is mildly reduced.  Mildly elevated pulmonary artery systolic pressure.  Left atrial size moderately dilated.  Right atrial size mild to moderate dilated.  -Labs from 11/16/2023 -SPEP/IFE no M protein.  Kappa 80.4, lambda 57.3 with a ratio of 1.40.  Creatinine 1.65.  Patient has CKD since 2018 with mild progression.  Hemoglobin 10.7, platelets 123, WBC 6.3.  -So far, the labs have not shown any evidence of monoclonal protein.  She has elevation in kappa and lambda which could be related to her chronic kidney disease itself.  I will repeat the myeloma panel today.  Will obtain 24-hour urine sample with UPEP/IFE to assess for proteinuria or any monoclonal protein presence.  She is scheduled for PYP cardiac scan on 12/13/2023.  I  also discussed about the rationale for abdominal fat pad biopsy which overall is low risk procedure and can show evidence of amyloid in about 50% of the cases.  Patient is agreeable to proceed.  I will follow-up with her in 3 weeks to discuss the results.   Orders Placed This Encounter  Procedures   US  CORE BIOPSY (SOFT TISSUE)    Standing Status:   Future    Expected Date:   12/16/2023    Expiration Date:   12/08/2024    Lab orders requested (DO NOT place separate lab orders, these will be automatically ordered during procedure specimen collection)::   Surgical Pathology    Reason for Exam (SYMPTOM  OR DIAGNOSIS REQUIRED):   abdominal mass concern for amyloidosis. h/o cardiac amyloidosis    Preferred location?:   [facility]   CBC with Differential/Platelet    Standing Status:   Future    Number of Occurrences:   1    Expected Date:   12/09/2023    Expiration Date:   12/08/2024   Comprehensive metabolic panel    Standing Status:   Future    Number of Occurrences:   1    Expected Date:   12/09/2023    Expiration Date:   12/08/2024   Kappa/lambda light chains    Standing Status:   Future    Number of Occurrences:   1    Expected Date:   12/09/2023    Expiration Date:   12/08/2024   Multiple Myeloma Panel (SPEP&IFE w/QIG)    Standing Status:   Future    Number of Occurrences:   1    Expected Date:   12/09/2023    Expiration  Date:   12/08/2024   Immunofixation, urine    Standing Status:   Future    Number of Occurrences:   1    Expected Date:   12/09/2023    Expiration Date:   12/08/2024   24 hr Ur UPEP/UIFE/Light Chains/TP    Standing Status:   Future    Expected Date:   12/09/2023    Expiration Date:   12/08/2024    The total time spent in the appointment was 60 minutes encounter with patients including review of chart and various tests results, discussions about plan of care and coordination of care plan   All questions were answered. The patient knows to call the clinic with any problems,  questions or concerns. No barriers to learning was detected.  [name], MD 1/10/20259:45 AM   HISTORY OF PRESENTING ILLNESS:  Donna Fuller 83 y.o. female with pmh of right breast cancer status postmastectomy, chronic diastolic CHF, diabetes, GERD, gout, hyperlipidemia, hypertension referred to hematology for workup of amyloidosis.  Patient follows with Dr. Gardenia, cardiology for diastolic CHF. Echocardiogram from 11/07/2023 showed EF of 55 to 60%.  No regional wall abnormality.  Moderate concentric LVH, grade 3 diastolic dysfunction.  LV has appearance of possible amyloid with moderate to severe LVH and restrictive filling.  Right ventricle systolic function is mildly reduced.  Mildly elevated pulmonary artery systolic pressure.  Left atrial size moderately dilated.  Right atrial size mild to moderate dilated.  Labs from 11/16/2023 -SPEP/IFE no M protein.  Kappa 80.4, lambda 57.3 with a ratio of 1.40.  Creatinine 1.65.  Patient has CKD since 2018 with mild progression.  Hemoglobin 10.7, platelets 123, WBC 6.3.  Patient was seen today accompanied by her husband.  She was in the wheelchair.  Reports having ongoing heart issues for few years now.  She is on diuretic.  Feels overwhelmed with multiple doctors appointments and trying to get answers for her thickened heart.    I have reviewed her chart and materials related to her cancer extensively and collaborated history with the patient. Summary of oncologic history is as follows: Oncology History   No history exists.    MEDICAL HISTORY:  Past Medical History:  Diagnosis Date   Arthritis    Breast cancer [facility]) 2007   Right breast, s/p mastectomy   Chronic diastolic congestive heart failure (HCC)    per patient, dx by cardiology   Diabetes mellitus without complication (HCC)    GERD (gastroesophageal reflux disease)    Gout    Hyperlipidemia    Hypertension    Spinal stenosis     SURGICAL HISTORY: Past Surgical History:   Procedure Laterality Date   ABDOMINAL HYSTERECTOMY     ABDOMINAL SURGERY     BREAST BIOPSY Left    2016 benign    BREAST BIOPSY Left 02/16/2023   MM LT BREAST BX W LOC DEV 1ST LESION IMAGE BX SPEC STEREO GUIDE 02/16/2023 [facility] MAMMOGRAPHY   LEFT HEART CATH AND CORONARY ANGIOGRAPHY N/A 11/21/2021   Procedure: LEFT HEART CATH AND CORONARY ANGIOGRAPHY;  Surgeon: Darron Deatrice LABOR, MD;  Location: [facility] INVASIVE CV LAB;  Service: Cardiovascular;  Laterality: N/A;   MASTECTOMY     right side   REPLACEMENT TOTAL KNEE Bilateral    TUBAL LIGATION      SOCIAL HISTORY: Social History   Socioeconomic History   Marital status: Married    Spouse name: Jimmy   Number of children: 3   Years of education: 59  Highest education level: Not on file  Occupational History   Occupation: Retired  Tobacco Use   Smoking status: Former    Types: Cigarettes    Passive exposure: Never   Smokeless tobacco: Never  Vaping Use   Vaping status: Never Used  Substance and Sexual Activity   Alcohol  use: No   Drug use: No   Sexual activity: Not Currently  Other Topics Concern   Not on file  Social History Narrative   Marital Status:  Married Hydrologist)    Children:  Daughter(1) Son (1)    Pets:  None    Living Situation: Lives with husband and daughter.     Occupation:  Retired CONTRACTOR)    Education: 12th Grade    Tobacco Use/Exposure:  She used to smoke socially during the weekends but quit 40 years ago.    Alcohol  Use:  Occasional   Drug Use:  None   Diet:  Regular   Exercise:  None   Hobbies:  Reading and playing volleyball             Social Drivers of Health   Financial Resource Strain: Low Risk  (11/08/2023)   Overall Financial Resource Strain (CARDIA)    Difficulty of Paying Living Expenses: Not hard at all  Food Insecurity: No Food Insecurity (11/09/2023)   Hunger Vital Sign    Worried About Running Out of Food in the Last Year: Never true    Ran Out of Food in the Last Year: Never true   Transportation Needs: No Transportation Needs (11/09/2023)   PRAPARE - Administrator, Civil Service (Medical): No    Lack of Transportation (Non-Medical): No  Physical Activity: Inactive (10/01/2021)   Exercise Vital Sign    Days of Exercise per Week: 0 days    Minutes of Exercise per Session: 0 min  Stress: No Stress Concern Present (10/01/2021)   [employer] of Occupational Health - Occupational Stress Questionnaire    Feeling of Stress : Not at all  Social Connections: Moderately Integrated (10/01/2021)   Social Connection and Isolation Panel [NHANES]    Frequency of Communication with Friends and Family: More than three times a week    Frequency of Social Gatherings with Friends and Family: More than three times a week    Attends Religious Services: More than 4 times per year    Active Member of [employer] or Organizations: No    Attends Banker Meetings: Never    Marital Status: Married  Catering Manager Violence: Not At Risk (11/09/2023)   Humiliation, Afraid, Rape, and Kick questionnaire    Fear of Current or Ex-Partner: No    Emotionally Abused: No    Physically Abused: No    Sexually Abused: No    FAMILY HISTORY: Family History  Problem Relation Age of Onset   Diabetes Mother    Alzheimer's disease Mother    Hypertension Mother    Hypertension Father    Heart disease Father    Hyperlipidemia Father    Kidney disease Father    Diabetes Sister    Cancer Brother        lung   Alzheimer's disease Brother    Heart disease Brother    Hypertension Brother    Diabetes Brother    Hypertension Brother    Diabetes Brother    Hyperlipidemia Son    Hypertension Son    Diabetes Son    Hypertension Daughter    Hyperlipidemia Daughter  ALLERGIES:  is allergic to penicillins, sulfa antibiotics, amlodipine, and lisinopril.  MEDICATIONS:  Current Outpatient Medications  Medication Sig Dispense Refill   acetaminophen  (TYLENOL ) 650 MG CR  tablet Take 650 mg by mouth 2 (two) times daily.     albuterol  (VENTOLIN  HFA) 108 (90 Base) MCG/ACT inhaler INHALE 2 PUFFS BY MOUTH EVERY 6 HOURS AS NEEDED (Patient taking differently: Inhale 2 puffs into the lungs every 6 (six) hours as needed for wheezing or shortness of breath.) 8.5 each 2   Alcohol  Swabs (ALCOHOL  PREP PAD) 70 % PADS Use to check blood sugars (Patient taking differently: 1 each by Other route See admin instructions. Use to check blood sugars) 100 each 3   ascorbic acid (VITAMIN C) 1000 MG tablet Take 1,000 mg by mouth daily.     aspirin  EC 81 MG tablet Take 1 tablet (81 mg total) by mouth daily. Swallow whole. 90 tablet 3   Benzocaine-Benzethonium (LANACANE EX) Apply 1 application  topically daily as needed. Roll-on daily as needed for knee pain.     benzonatate  (TESSALON ) 100 MG capsule Take 1 capsule (100 mg total) by mouth 3 (three) times daily as needed for cough. 30 capsule 0   blood glucose meter kit and supplies KIT Dispense based on patient and insurance preference. Use up to four times daily as directed. (FOR ICD-9 250.00, 250.01). (Patient taking differently: Inject 1 each into the skin as directed. Dispense based on patient and insurance preference. Use up to four times daily as directed. (FOR ICD-9 250.00, 250.01).) 1 each 11   brimonidine  (ALPHAGAN ) 0.2 % ophthalmic solution Place 1 drop into both eyes 3 (three) times daily.     bumetanide  (BUMEX ) 1 MG tablet Take 1 mg by mouth daily.     carvedilol  (COREG ) 6.25 MG tablet Take 1 tablet (6.25 mg total) by mouth 2 (two) times daily with a meal. 60 tablet 3   Cholecalciferol (VITAMIN D-3 PO) Take 2,000 Units by mouth daily.     Cyanocobalamin  (B-12 PO) Take 1 tablet by mouth daily.     Dulaglutide  (TRULICITY ) 1.5 MG/0.5ML SOAJ INJECT 1.5 MG (0.5ML) UNDER THE SKIN ONCE A WEEK 2 mL 2   empagliflozin  (JARDIANCE ) 10 MG TABS tablet Take 1 tablet (10 mg total) by mouth daily before breakfast. 30 tablet 5   ezetimibe  (ZETIA ) 10  MG tablet Take 1 tablet (10 mg total) by mouth daily. 90 tablet 3   fluticasone  (FLONASE ) 50 MCG/ACT nasal spray SPRAY 2 SPRAYS INTO EACH NOSTRIL EVERY DAY 48 mL 1   FLUZONE HIGH-DOSE 0.5 ML injection      gabapentin  (NEURONTIN ) 300 MG capsule Take 1 capsule (300 mg total) by mouth 3 (three) times daily. 270 capsule 1   glipiZIDE  (GLUCOTROL ) 5 MG tablet Take 1 tablet (5 mg total) by mouth 2 (two) times daily before a meal. 180 tablet 3   ketorolac  (ACULAR ) 0.5 % ophthalmic solution INSTILL 1 DROP INTO RIGHT EYE 4 TIMES A DAY (Patient taking differently: Place 1 drop into the right eye 4 (four) times daily. INSTILL 1 DROP INTO RIGHT EYE 3 TIMES A DAY) 5 mL 6   latanoprost  (XALATAN ) 0.005 % ophthalmic solution Place 1 drop into both eyes daily.     levocetirizine (XYZAL ) 5 MG tablet TAKE 1 TABLET BY MOUTH EVERY DAY IN THE EVENING 90 tablet 1   losartan  (COZAAR ) 50 MG tablet TAKE 1 TABLET BY MOUTH EVERY DAY 90 tablet 3   omeprazole  (PRILOSEC) 40 MG capsule TAKE 1  CAP 2 TIMES DAILY. PLEASE CALL [phone] TO SCHEDULE AN OFFICE VISIT FOR MORE REFILLS. 180 capsule 0   OneTouch Delica Lancets 33G MISC Use as instructed to check blood sugar 2-3 times a day.  DX E11.9 (Patient taking differently: 1 each by Other route See admin instructions. Use as instructed to check blood sugar 2-3 times a day.  DX E11.9) 100 each 3   ONETOUCH ULTRA TEST test strip CHECK BLOOD SUGAR 2 TO 3 TIMES DAILY AS DIRECTED (Patient taking differently: 1 each by Other route as needed for other.) 100 strip 5   timolol  (TIMOPTIC ) 0.5 % ophthalmic solution Place 1 drop into both eyes 2 (two) times a day.     No current facility-administered medications for this visit.    REVIEW OF SYSTEMS:   Pertinent information mentioned in HPI All other systems were reviewed with the patient and are negative.  PHYSICAL EXAMINATION: ECOG PERFORMANCE STATUS: 2 - Symptomatic, <50% confined to bed  Vitals:   12/09/23 1357  BP: 118/60  Pulse:  69  Resp: 16  Temp: 98 F (36.7 C)  SpO2: 98%   Filed Weights   12/09/23 1357  Weight: 185 lb 9.6 oz (84.2 kg)    GENERAL:alert, no distress and comfortable SKIN: skin color, texture, turgor are normal, no rashes or significant lesions EYES: normal, conjunctiva are pink and non-injected, sclera clear OROPHARYNX:no exudate, no erythema and lips, buccal mucosa, and tongue normal  NECK: supple, thyroid  normal size, non-tender, without nodularity LYMPH:  no palpable lymphadenopathy in the cervical, axillary or inguinal LUNGS: clear to auscultation and percussion with normal breathing effort HEART: regular rate & rhythm and no murmurs and no lower extremity edema ABDOMEN:abdomen soft, non-tender and normal bowel sounds Musculoskeletal:no cyanosis of digits and no clubbing  PSYCH: alert & oriented x 3 with fluent speech NEURO: no focal motor/sensory deficits  LABORATORY DATA:  I have reviewed the data as listed Lab Results  Component Value Date   WBC 6.6 12/09/2023   HGB 13.1 12/09/2023   HCT 40.0 12/09/2023   MCV 92.2 12/09/2023   PLT 151 12/09/2023   Recent Labs    10/25/23 1412 11/05/23 1802 11/09/23 1450 11/16/23 1258 11/22/23 1441 12/09/23 1436  NA 141   < > 142 141 138 139  K 4.5   < > 4.3 4.4 4.4 4.0  CL 102   < > 104 104 102 102  CO2 29   < > 27 27 26 25   GLUCOSE 143*   < > 188* 99 163* 121*  BUN 42*   < > 35* 36* 36* 43*  CREATININE 1.93*   < > 1.49* 1.74* 1.65* 1.71*  CALCIUM 9.5   < > 9.7 9.6 8.9 9.2  GFRNONAA  --    < >  --  29* 31* 30*  PROT 7.0  --  7.3  --   --  8.1  ALBUMIN 4.1  --  4.2  --   --  4.2  AST 14  --  12  --   --  19  ALT 10  --  7  --   --  15  ALKPHOS 142*  --  116  --   --  134*  BILITOT 0.8  --  1.0  --   --  0.9   < > = values in this interval not displayed.    RADIOGRAPHIC STUDIES: I have personally reviewed the radiological images as listed and agreed with the findings in the report.  No results found.

## 2023-12-13 ENCOUNTER — Ambulatory Visit (HOSPITAL_COMMUNITY): Payer: PPO

## 2023-12-13 ENCOUNTER — Telehealth: Payer: Self-pay

## 2023-12-13 LAB — KAPPA/LAMBDA LIGHT CHAINS
Kappa free light chain: 77.3 mg/L — ABNORMAL HIGH (ref 3.3–19.4)
Kappa, lambda light chain ratio: 1.95 — ABNORMAL HIGH (ref 0.26–1.65)
Lambda free light chains: 39.7 mg/L — ABNORMAL HIGH (ref 5.7–26.3)

## 2023-12-13 NOTE — Telephone Encounter (Signed)
 Dr. Alena Bills notified that patient wants to hold off on biopsy at this itme.

## 2023-12-13 NOTE — Progress Notes (Signed)
 Sterling Big, MD sent to Donna [facility] Approved for abdominal fat pad biopsy for amyloidosis.  HKM

## 2023-12-13 NOTE — Telephone Encounter (Signed)
 Called and spoke with patient about the biopsy appointment and she told me that she is feeling so overwhelmed right now so she don't want to go through with the procedure, so I told her that is totally fine, and to reach out to us  if there is anything that we can do for her.

## 2023-12-14 NOTE — Telephone Encounter (Signed)
 Pt refused biopsy at this time. Dr. Mervyn Skeeters aware

## 2023-12-15 ENCOUNTER — Other Ambulatory Visit (HOSPITAL_COMMUNITY): Payer: Self-pay

## 2023-12-15 ENCOUNTER — Ambulatory Visit: Payer: PPO | Attending: Cardiology | Admitting: Cardiology

## 2023-12-15 ENCOUNTER — Telehealth: Payer: Self-pay

## 2023-12-15 VITALS — BP 110/63 | HR 68 | Wt 183.4 lb

## 2023-12-15 DIAGNOSIS — G6289 Other specified polyneuropathies: Secondary | ICD-10-CM

## 2023-12-15 DIAGNOSIS — I43 Cardiomyopathy in diseases classified elsewhere: Secondary | ICD-10-CM | POA: Diagnosis not present

## 2023-12-15 DIAGNOSIS — E854 Organ-limited amyloidosis: Secondary | ICD-10-CM | POA: Diagnosis not present

## 2023-12-15 DIAGNOSIS — I5032 Chronic diastolic (congestive) heart failure: Secondary | ICD-10-CM | POA: Diagnosis not present

## 2023-12-15 LAB — IMMUNOFIXATION, URINE

## 2023-12-15 MED ORDER — CARVEDILOL 3.125 MG PO TABS: 3.1250 mg | ORAL_TABLET | Freq: Two times a day (BID) | ORAL | 3 refills | Status: DC

## 2023-12-15 NOTE — Telephone Encounter (Signed)
 Advanced Heart Failure Patient Advocate Encounter  The patient was approved for a [employer] grant that will help cover the cost of Amvuttra .  Total amount awarded, $10,000.  Effective: 11/15/2023 - 11/13/2024.  BIN [id number] PCN [id number] [employer] [id number] ID [id number]  Pharmacy provided with approval and processing information. Patient informed in office.  Kennis Peacock, CPhT Rx Patient Advocate Phone: [phone]

## 2023-12-15 NOTE — Progress Notes (Signed)
ADVANCED HEART FAILURE CLINIC NOTE  Referring Physician: Esperanza Richters, PA-C  Primary Care: Esperanza Richters, PA-C  HPI: Donna Fuller is a 83 y.o. 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  Her cardiac history dates back to at least 2022 when she presented to [facility] with complaints of substernal chest pressure.  Patient underwent left heart catheterization that demonstrated only mild nonobstructive CAD of the LAD.  Echocardiogram with moderate to severe LVH and grade 2 diastolic dysfunction with LV ejection fraction of 60 to 65%.  Since that time patient reports she has had a continued decline in her functional status.  She was admitted to [facility] in July and December 2024 with acute on chronic HFpEF exacerbations.  In December 2024 she was diuresed with IV Lasix and discharged home on torsemide 10 mg daily.  She reports having bilateral carpal tunnel syndrome, peripheral neuropathy and low back pain.    Interval history During her last appointment we had an extensive discussion regarding cardiac amyloid.  Since that time she has undergone genetic testing which was positive.  In addition, we increased her diuretics and she reports significant improvement in functional status.  Her lower extremity edema has resolved, her PND and orthopnea have also resolved.  She feels like she is able to ambulate longer distances at home and presents today to discuss further testing for cardiac amyloid.   Past Medical History:  Diagnosis Date   Arthritis    Breast cancer [facility]) 2007   Right breast, s/p mastectomy   Chronic diastolic congestive heart failure (HCC)    per patient, dx by cardiology   Diabetes mellitus without complication (HCC)    GERD (gastroesophageal reflux disease)    Gout    Hyperlipidemia    Hypertension    Spinal stenosis     Current Outpatient Medications  Medication  Sig Dispense Refill   acetaminophen (TYLENOL) 650 MG CR tablet Take 650 mg by mouth 2 (two) times daily.     albuterol (VENTOLIN HFA) 108 (90 Base) MCG/ACT inhaler INHALE 2 PUFFS BY MOUTH EVERY 6 HOURS AS NEEDED (Patient taking differently: Inhale 2 puffs into the lungs every 6 (six) hours as needed for wheezing or shortness of breath.) 8.5 each 2   Alcohol Swabs (ALCOHOL PREP PAD) 70 % PADS Use to check blood sugars (Patient taking differently: 1 each by Other route See admin instructions. Use to check blood sugars) 100 each 3   ascorbic acid (VITAMIN C) 1000 MG tablet Take 1,000 mg by mouth daily.     aspirin EC 81 MG tablet Take 1 tablet (81 mg total) by mouth daily. Swallow whole. 90 tablet 3   Benzocaine-Benzethonium (LANACANE EX) Apply 1 application  topically daily as needed. Roll-on daily as needed for knee pain.     benzonatate (TESSALON) 100 MG capsule Take 1 capsule (100 mg total) by mouth 3 (three) times daily as needed for cough. 30 capsule 0   blood glucose meter kit and supplies KIT Dispense based on patient and insurance preference. Use up to four times daily as directed. (FOR ICD-9 250.00, 250.01). (Patient taking differently: Inject 1 each into the skin as directed. Dispense based on patient and insurance preference. Use up to four times daily as directed. (FOR ICD-9 250.00, 250.01).) 1 each 11   brimonidine (ALPHAGAN) 0.2 % ophthalmic solution Place 1 drop into both eyes 3 (three) times daily.     bumetanide (  BUMEX) 1 MG tablet Take 1 mg by mouth daily.     Cholecalciferol (VITAMIN D-3 PO) Take 2,000 Units by mouth daily.     Cyanocobalamin (B-12 PO) Take 1 tablet by mouth daily.     Dulaglutide (TRULICITY) 1.5 MG/0.5ML SOAJ INJECT 1.5 MG (0.5ML) UNDER THE SKIN ONCE A WEEK 2 mL 2   empagliflozin (JARDIANCE) 10 MG TABS tablet Take 1 tablet (10 mg total) by mouth daily before breakfast. 30 tablet 5   ezetimibe (ZETIA) 10 MG tablet Take 1 tablet (10 mg total) by mouth daily. 90 tablet  3   fluticasone (FLONASE) 50 MCG/ACT nasal spray SPRAY 2 SPRAYS INTO EACH NOSTRIL EVERY DAY 48 mL 1   FLUZONE HIGH-DOSE 0.5 ML injection      gabapentin (NEURONTIN) 300 MG capsule Take 1 capsule (300 mg total) by mouth 3 (three) times daily. 270 capsule 1   glipiZIDE (GLUCOTROL) 5 MG tablet Take 1 tablet (5 mg total) by mouth 2 (two) times daily before a meal. 180 tablet 3   ketorolac (ACULAR) 0.5 % ophthalmic solution INSTILL 1 DROP INTO RIGHT EYE 4 TIMES A DAY (Patient taking differently: Place 1 drop into the right eye 4 (four) times daily. INSTILL 1 DROP INTO RIGHT EYE 3 TIMES A DAY) 5 mL 6   latanoprost (XALATAN) 0.005 % ophthalmic solution Place 1 drop into both eyes daily.     levocetirizine (XYZAL) 5 MG tablet TAKE 1 TABLET BY MOUTH EVERY DAY IN THE EVENING 90 tablet 1   losartan (COZAAR) 50 MG tablet TAKE 1 TABLET BY MOUTH EVERY DAY 90 tablet 3   omeprazole (PRILOSEC) 40 MG capsule TAKE 1 CAP 2 TIMES DAILY. PLEASE CALL [phone] TO SCHEDULE AN OFFICE VISIT FOR MORE REFILLS. 180 capsule 0   OneTouch Delica Lancets 33G MISC Use as instructed to check blood sugar 2-3 times a day.  DX E11.9 (Patient taking differently: 1 each by Other route See admin instructions. Use as instructed to check blood sugar 2-3 times a day.  DX E11.9) 100 each 3   ONETOUCH ULTRA TEST test strip CHECK BLOOD SUGAR 2 TO 3 TIMES DAILY AS DIRECTED (Patient taking differently: 1 each by Other route as needed for other.) 100 strip 5   timolol (TIMOPTIC) 0.5 % ophthalmic solution Place 1 drop into both eyes 2 (two) times a day.     carvedilol (COREG) 3.125 MG tablet Take 1 tablet (3.125 mg total) by mouth 2 (two) times daily with a meal. 60 tablet 3   Vitamin A 2250 MCG (7500 UT) CAPS Take 1 capsule by mouth daily. You can find this over the counter. 30 capsule 11   vutrisiran sodium (AMVUTTRA) 25 MG/0.5ML syringe Inject 0.5 mLs (25 mg total) into the skin every 3 (three) months. 0.5 mL 3   No current  facility-administered medications for this visit.    Allergies  Allergen Reactions   Penicillins Swelling   Sulfa Antibiotics Swelling   Amlodipine Swelling    LE Swelling   Lisinopril Cough      Social History   Socioeconomic History   Marital status: Married    Spouse name: Jimmy   Number of children: 3   Years of education: 12   Highest education level: Not on file  Occupational History   Occupation: Retired  Tobacco Use   Smoking status: Former    Types: Cigarettes    Passive exposure: Never   Smokeless tobacco: Never  Vaping Use   Vaping status: Never Used  Substance and Sexual Activity   Alcohol use: No   Drug use: No   Sexual activity: Not Currently  Other Topics Concern   Not on file  Social History Narrative   Marital Status:  Married Hydrologist)    Children:  Daughter(1) Son (1)    Pets:  None    Living Situation: Lives with husband and daughter.     Occupation:  Retired Contractor)    Education: 12th Grade    Tobacco Use/Exposure:  She used to smoke socially during the weekends but quit 40 years ago.    Alcohol Use:  Occasional   Drug Use:  None   Diet:  Regular   Exercise:  None   Hobbies:  Reading and playing volleyball             Social Drivers of Health   Financial Resource Strain: Low Risk  (11/08/2023)   Overall Financial Resource Strain (CARDIA)    Difficulty of Paying Living Expenses: Not hard at all  Food Insecurity: No Food Insecurity (11/09/2023)   Hunger Vital Sign    Worried About Running Out of Food in the Last Year: Never true    Ran Out of Food in the Last Year: Never true  Transportation Needs: No Transportation Needs (11/09/2023)   PRAPARE - Administrator, Civil Service (Medical): No    Lack of Transportation (Non-Medical): No  Physical Activity: Inactive (10/01/2021)   Exercise Vital Sign    Days of Exercise per Week: 0 days    Minutes of Exercise per Session: 0 min  Stress: No Stress Concern Present (10/01/2021)    [employer] of Occupational Health - Occupational Stress Questionnaire    Feeling of Stress : Not at all  Social Connections: Moderately Integrated (10/01/2021)   Social Connection and Isolation Panel [NHANES]    Frequency of Communication with Friends and Family: More than three times a week    Frequency of Social Gatherings with Friends and Family: More than three times a week    Attends Religious Services: More than 4 times per year    Active Member of [employer] or Organizations: No    Attends Banker Meetings: Never    Marital Status: Married  Catering manager Violence: Not At Risk (11/09/2023)   Humiliation, Afraid, Rape, and Kick questionnaire    Fear of Current or Ex-Partner: No    Emotionally Abused: No    Physically Abused: No    Sexually Abused: No      Family History  Problem Relation Age of Onset   Diabetes Mother    Alzheimer's disease Mother    Hypertension Mother    Hypertension Father    Heart disease Father    Hyperlipidemia Father    Kidney disease Father    Diabetes Sister    Cancer Brother        lung   Alzheimer's disease Brother    Heart disease Brother    Hypertension Brother    Diabetes Brother    Hypertension Brother    Diabetes Brother    Hyperlipidemia Son    Hypertension Son    Diabetes Son    Hypertension Daughter    Hyperlipidemia Daughter     PHYSICAL EXAM: Vitals:   12/15/23 1440  BP: 110/63  Pulse: 68   GENERAL: Well nourished, well developed, and in no apparent distress at rest.  HEENT: There is no scleral icterus.  The mucous membranes are pink and moist.   CHEST: There  are no chest wall deformities. There is no chest wall tenderness. Respirations are unlabored.  Lungs-CTA bilaterally CARDIAC:  JVP: 8 cm          Normal rate with regular rhythm.  No murmur.  Pulses are 2+ and symmetrical in upper and lower extremities.  Trace edema.  ABDOMEN: Soft, non-tender, non-distended. There are normal bowel sounds.   EXTREMITIES: Warm and well perfused.  NEUROLOGIC: Patient is oriented x3 with no obvious focal neurologic deficits.  PSYCH: Patients affect is appropriate SKIN: Warm and dry; no lesions or wounds.    DATA REVIEW  ECG: 11/08/23: Normal sinus rhythm and first-degree AV block as per my personal interpretation  ECHO: 11/07/23: LVEF 60 to 65% with severe LVH and grade 3 diastolic dysfunction as per my personal interpretation  CATH: [facility], 2022:    Mid LAD lesion is 20% stenosed.   LV end diastolic pressure is mildly elevated.   1.  Mild nonobstructive coronary artery disease. 2.  Left ventricular angiography was not performed.  EF was normal by echo. 3.  Mildly elevated left ventricular end-diastolic pressure at 18 mmHg.   ASSESSMENT & PLAN:  Evauation for cardiac amyloid  - TTE with restrictive features; severe LVH and myocardium that appears to be secondary to possible cardiac amyloidosis.  -Multiple myeloma panel negative -PYP scan scheduled for early February 2025 -Genetic panel positive for cardiac amyloid, ATTR  -Volume status has improved significantly.  Continue Bumex 1 mg twice daily -Repeat BMP BNP -Extensive discussion today with daughter, husband and son regarding further genetic testing for the family.  In addition discussed starting therapy with tafamidis with Pharm.D. today.  2. HFpEF, restrictive heart disease -Secondary to cardiac amyloid - Will continue bumex; repeat BMP/BNP - Plan to start finerenone at follow up. -Reduce Coreg to 3.125 mg twice daily  3. Peripheral neuropathy - Reports mild sensory deficits in her fingers with carpal tunnel like symptoms.  - Her A1C has ranged from 6 to 6.8 since 2022. -Her polyneuropathy is secondary to cardiac amyloid.  Will plan on starting therapy.  Discussed with Pharm.D.  4. T2DM - A1C from 6.8 to 6 ranging from 2022 to 2024 - Continue trulicity - will plan to start SGLT2i at follow up.   5. Hypertension  -  continue losartan 50mg  daily  6. CKD -Repeat labs today.  I spent 50 minutes caring for this patient today including face to face time, ordering and reviewing labs, we had an extensive discussion involving her son, daughter and husband with regards to positive genetic testing, impact on family and need for further genetic testing.  We also discussed drug therapy and introduced the family to our Pharm.D. we discussed life expectancy with cardiac amyloid and treatment options.,    Shakiara Lukic Advanced Heart Failure Mechanical Circulatory Support

## 2023-12-15 NOTE — Patient Instructions (Signed)
 Great to see you today!!!  Medication Changes:  DECREASE Carvedilol  to 3.125 mg (1/2 tab) Twice daily   Lab Work:  none  Testing/Procedures:  You have been ordered a PYP Scan, on 01/05/24 This is done at [facility], they are located at: [address] [place], [place] [address] When you come for this test please plan to be there 2-3 hours.    Referrals:  You have been referred to Dr Drexel Gentles for genetic counseling, her office will call you to schedule  Special Instructions // Education:  Do the following things EVERYDAY: Weigh yourself in the morning before breakfast. Write it down and keep it in a log. Take your medicines as prescribed Eat low salt foods--Limit salt (sodium) to 2000 mg per day.  Stay as active as you can everyday Limit all fluids for the day to less than 2 liters   Follow-Up in: 1 month    If you have any questions or concerns before your next appointment please send us  a message through mychart or call our office at [phone] Monday-Friday 8 am-5 pm.   If you have an urgent need after hours on the weekend please call your Primary Cardiologist or the Advanced Heart Failure Clinic in [place] at [phone].   At the Advanced Heart Failure Clinic, you and your health needs are our priority. We have a designated team specialized in the treatment of Heart Failure. This Care Team includes your primary Heart Failure Specialized Cardiologist (physician), Advanced Practice Providers (APPs- Physician Assistants and Nurse Practitioners), and Pharmacist who all work together to provide you with the care you need, when you need it.   You may see any of the following providers on your designated Care Team at your next follow up:  Dr. Jules Oar Dr. Peder Bourdon Dr. Alwin Baars Dr. Judyth Nunnery Nieves Bars, NP Ruddy Corral, [place] [address] [place], [place] Dennise Fitz, NP [place] Lee, NP Shawnee Dellen, NP Bevely Brush, PharmD

## 2023-12-16 ENCOUNTER — Other Ambulatory Visit (HOSPITAL_COMMUNITY): Payer: Self-pay

## 2023-12-16 LAB — MULTIPLE MYELOMA PANEL, SERUM
Albumin SerPl Elph-Mcnc: 3.9 g/dL (ref 2.9–4.4)
Albumin/Glob SerPl: 1.1 (ref 0.7–1.7)
Alpha 1: 0.3 g/dL (ref 0.0–0.4)
Alpha2 Glob SerPl Elph-Mcnc: 0.7 g/dL (ref 0.4–1.0)
B-Globulin SerPl Elph-Mcnc: 1.3 g/dL (ref 0.7–1.3)
Gamma Glob SerPl Elph-Mcnc: 1.3 g/dL (ref 0.4–1.8)
Globulin, Total: 3.7 g/dL (ref 2.2–3.9)
IgA: 401 mg/dL (ref 64–422)
IgG (Immunoglobin G), Serum: 1345 mg/dL (ref 586–1602)
IgM (Immunoglobulin M), Srm: 129 mg/dL (ref 26–217)
Total Protein ELP: 7.6 g/dL (ref 6.0–8.5)

## 2023-12-17 ENCOUNTER — Encounter (HOSPITAL_COMMUNITY): Payer: Self-pay

## 2023-12-17 ENCOUNTER — Other Ambulatory Visit: Payer: Self-pay

## 2023-12-17 ENCOUNTER — Other Ambulatory Visit: Payer: Self-pay | Admitting: Pharmacist

## 2023-12-17 ENCOUNTER — Other Ambulatory Visit (HOSPITAL_COMMUNITY): Payer: Self-pay

## 2023-12-17 MED ORDER — AMVUTTRA 25 MG/0.5ML ~~LOC~~ SOSY
25.0000 mg | PREFILLED_SYRINGE | SUBCUTANEOUS | 3 refills | Status: DC
  Filled 2023-12-17: qty 0.5, 90d supply, fill #0

## 2023-12-17 MED ORDER — VITAMIN A 2250 MCG (7500 UT) PO CAPS
ORAL_CAPSULE | ORAL | 11 refills | Status: AC
  Filled 2023-12-17: qty 30, fill #0

## 2023-12-17 NOTE — Progress Notes (Addendum)
Specialty Pharmacy Initiation Note   Donna Fuller is a 83 y.o. female who will be followed by the specialty pharmacy service for RxSp Cardiology    Review of administration, indication, effectiveness, safety, potential side effects, storage/disposable, and missed dose instructions occurred today for patient's specialty medication(s) Vutrisiran Sodium (Amvuttra)     Patient/Caregiver asked additional questions regarding vitamin A supplementation which were explained to the patient's satisfaction..  Patient's therapy is appropriate to: Initiate    Goals Addressed             This Visit's Progress    Disease Progression Prevented or Minimized       Evidence-based guidance:  Assess symptom control by the frequency of daytime and nighttime asthma symptoms, reliever use and activity limitation at every encounter.  Note: Asthma control test, sputum eosinophil counts or similar may be added at intervals determined by team.  Identify comorbidity that may exacerbate symptoms, such as gastroesophageal reflux disease, rhinitis, obesity, obstructive sleep apnea, depression or anxiety.  Assess and reinforce correct inhaler technique frequently.  Encourage development and acceptance of self-management (asthma action) plan.  Consider home pulse oximetry for patients with decreased perception of hypoxia.  Identify and assist patient to manage identified triggers, such as smoking, viruses, eczema, weather changes, emotional upsets, excessive exercise, obesity and gastroesophageal reflux, as well as environmental allergens, including   pollen, dust mites, mold, pets and smoke; consider occupational-exposure reduction versus elimination to avoid compromising employment status.  Prepare for use of pharmacologic therapy, such as quick reliever, long-term controller, antihistamine, oral steroid, monoclonal anti-immunoglobulin E, immunizations in a stepped approach, based on presenting signs/symptoms and    laboratory results; monitor side effects and expect periodic adjustments.  Prepare patient for laboratory and diagnostic exams based on risk and presentation.  Provide smoking cessation intervention using screening, brief intervention and referral for treatment.  Review medication use, including delivery method, dose, frequency; identify barriers to adherence, such as access to medication, adverse reaction or lack of skill.   Notes:      Reduce signs and symptoms       Patient is initiating therapy. Patient will be monitored by provider to determine if a change in treatment plan is warranted

## 2023-12-17 NOTE — Progress Notes (Signed)
Specialty Pharmacy Initial Fill Coordination Note  Donna Fuller is a 83 y.o. female contacted today regarding initial fill of specialty medication(s) Vutrisiran Sodium [name])   Patient requested Courier to Provider Office   Delivery date: 12/21/23   Verified address: [address] [place] [address]   Medication will be filled on 12/21/2023.   Patient is aware of $0 copayment.

## 2023-12-20 ENCOUNTER — Other Ambulatory Visit: Payer: Self-pay

## 2023-12-20 ENCOUNTER — Other Ambulatory Visit (HOSPITAL_COMMUNITY): Payer: Self-pay

## 2023-12-22 ENCOUNTER — Other Ambulatory Visit (HOSPITAL_BASED_OUTPATIENT_CLINIC_OR_DEPARTMENT_OTHER): Payer: Self-pay

## 2023-12-23 ENCOUNTER — Other Ambulatory Visit (HOSPITAL_COMMUNITY): Payer: Self-pay

## 2023-12-23 ENCOUNTER — Telehealth: Payer: Self-pay | Admitting: Pharmacist

## 2023-12-23 ENCOUNTER — Other Ambulatory Visit: Payer: Self-pay

## 2023-12-23 ENCOUNTER — Ambulatory Visit: Payer: PPO | Attending: Cardiology | Admitting: Pharmacist

## 2023-12-23 ENCOUNTER — Telehealth: Payer: Self-pay

## 2023-12-23 VITALS — BP 116/62 | HR 72

## 2023-12-23 DIAGNOSIS — I43 Cardiomyopathy in diseases classified elsewhere: Secondary | ICD-10-CM | POA: Diagnosis not present

## 2023-12-23 DIAGNOSIS — E854 Organ-limited amyloidosis: Secondary | ICD-10-CM | POA: Diagnosis not present

## 2023-12-23 MED ORDER — AMVUTTRA 25 MG/0.5ML ~~LOC~~ SOSY
25.0000 mg | PREFILLED_SYRINGE | SUBCUTANEOUS | 11 refills | Status: DC
  Filled 2023-12-23 – 2024-03-16 (×2): qty 0.5, 90d supply, fill #0

## 2023-12-23 MED ORDER — EMPAGLIFLOZIN 10 MG PO TABS: 10.0000 mg | ORAL_TABLET | Freq: Every day | ORAL | 5 refills | Status: DC

## 2023-12-23 NOTE — Patient Instructions (Signed)
It was a pleasure seeing you today!  MEDICATIONS: -No medication changes today  -Call if you have questions about your medications.  LABS: -We will call you if your labs need attention.  NEXT APPOINTMENT: Return to clinic in 3 months for your Amvuttra injection.  In general, to take care of your heart failure: -Limit your fluid intake to 2 Liters (half-gallon) per day.   -Limit your salt intake to ideally 2-3 grams (2000-3000 mg) per day. -Weigh yourself daily and record, and bring that "weight diary" to your next appointment.  (Weight gain of 2-3 pounds in 1 day typically means fluid weight.) -The medications for your heart are to help your heart and help you live longer.   -Please contact us before stopping any of your heart medications.  Call the clinic at [phone] with questions or to reschedule future appointments.

## 2023-12-23 NOTE — Progress Notes (Signed)
Advanced Heart Failure Clinic Note  PCP: Esperanza Richters, PA-C PCP-Cardiologist: Julien Nordmann, MD HF-Cardiologist: Dorthula Nettles, DO  HPI:  Donna Fuller is a 83 y.o. 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   Her cardiac history dates back to at least 2022 when she presented to [facility] with complaints of substernal chest pressure.  Patient underwent left heart catheterization that demonstrated only mild nonobstructive CAD of the LAD.  Echocardiogram with moderate to severe LVH and grade 2 diastolic dysfunction with LV ejection fraction of 60 to 65%.  Since that time patient reports she has had a continued decline in her functional status.  She was admitted to [facility] in July and December 2024 with acute on chronic HFpEF exacerbations.  In December 2024 she was diuresed with IV Lasix and discharged home on torsemide 10 mg daily.  She reports having bilateral carpal tunnel syndrome, peripheral neuropathy and low back pain.     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    Seen by Dr. Gasper Lloyd on 12/15/23. Patient was doing much better after increasing diuretics, able to ambulate and complete ADLs easier. Carvedilol was decreased to 3.125 mg BID  Today Donna Fuller returns to Heart Failure Clinic for pharmacist medication titration. Reports feeling well and significantly improved from months prior. Reports improvments to shortness of breath, fatigue, orthopnea, and edema, being able to complete all activities of daily living (ADLs). Is somewhat active throughout the day. Weight at home is ~180 pounds. Takes bumetanide 1 mg daily. Appetite is improved. Somewhat follows a low sodium diet.  Current Heart Failure Medications: Loop diuretic: bumetanide 1 mg daily Beta-Blocker: carvedilol 3.125 mg twice daily ACEI/ARB/ARNI: losartan 50 mg daily MRA:  none SGLT2i: Jardiance 10 mg daily  Has the patient been experiencing any side effects to the medications prescribed? No  Does the patient have any problems obtaining medications due to transportation or finances? No  Understanding of regimen: Fair  Understanding of indications: Fair  Potential of adherence: Fair  Patient understands to avoid NSAIDs.  Patient understands to avoid decongestants.  Pertinent Lab Values: Creat  Date Value Ref Range Status  06/18/2023 1.90 (H) 0.60 - 0.95 mg/dL Final   Creatinine, Ser  Date Value Ref Range Status  12/09/2023 1.71 (H) 0.44 - 1.00 mg/dL Final   BUN  Date Value Ref Range Status  12/09/2023 43 (H) 8 - 23 mg/dL Final  52/84/1324 28 (H) 8 - 27 mg/dL Final   Potassium  Date Value Ref Range Status  12/09/2023 4.0 3.5 - 5.1 mmol/L Final   Sodium  Date Value Ref Range Status  12/09/2023 139 135 - 145 mmol/L Final  08/25/2023 145 (H) 134 - 144 mmol/L Final   Brain Natriuretic Peptide  Date Value Ref Range Status  06/18/2023 364 (H) <100 pg/mL Final    Comment:    . BNP levels increase with age in the general population with the highest values seen in individuals greater than 72 years of age. Reference: J. Am. Ladon Applebaum. Cardiol. 2002; 40:102-725. .    B Natriuretic Peptide  Date Value Ref Range Status  11/16/2023 652.8 (H) 0.0 - 100.0 pg/mL Final    Comment:    Performed at [facility], [address] [place], [place] [address]   Magnesium  Date Value Ref Range Status  11/21/2021 2.2 1.7 - 2.4 mg/dL Final    Comment:    Performed at  [facility], [address] [place], [place] [address]   TSH  Date Value Ref Range Status  06/18/2023 1.69 0.40 - 4.50 mIU/L Final    Vital Signs: Today's Vitals   12/23/23 1407  BP: 116/62  Pulse: 72  SpO2: 97%   Assessment/Plan: Evauation for cardiac amyloid  - TTE with restrictive features; severe LVH and myocardium that appears to be secondary to  possible cardiac amyloidosis.  -Multiple myeloma panel negative -PYP scan scheduled for early February 2025 -Genetic panel positive for cardiac amyloid, ATTR  -Volume status has improved significantly.  Continue Bumex 1 mg twice daily  -Considering tafamidis if PYP scan is positive -Started on Amvuttra today for polyneuropathy. Educated and provided with handout.    2. HFpEF, restrictive heart disease -Secondary to cardiac amyloid - Will continue Bumex 1 mg daily. Did not get labs last visit as intended by Dr. Gasper Lloyd. Will place orders today. - Plan to start finerenone at follow up. Prior authorization completed and approved. UACR 116 on 08/18/23 - Coreg reduced to 3.125 mg twice daily last visit given limited benefit in HFpEF   3. Peripheral neuropathy - Reports mild sensory deficits in her fingers with carpal tunnel like symptoms.  - Her A1C has ranged from 6 to 6.8 since 2022. - Her polyneuropathy is secondary to cardiac amyloid.  First injection of Amvuttra administered in clinic today [facility] [id number], LOT [id number], Exp Jan 2027). Tolerated the injection well. Next injection in 3 months.   4. T2DM - A1C from 6.8 to 6 ranging from 2022 to 2024 - Continue Trulicity - Continue Jardiance 10 mg daily. Refill sent in.    5. Hypertension  - Continue losartan 50 mg daily   6. CKD -Repeat labs today.  Follow up: With Pharmacy in 3 months for next injection. With Dr. Gasper Lloyd in February.  Please do not hesitate to reach out with questions or concerns,  Enos Fling, PharmD, CPP, BCPS Heart Failure Pharmacist  Phone - [phone] 12/23/2023 2:26 PM

## 2023-12-23 NOTE — Telephone Encounter (Signed)
Advanced Heart Failure Patient Advocate Encounter  Prior authorization for Chauncey Mann has been submitted and approved. Test billing returns $0 for 90 day supply.  Key: [id number] Effective: 12/23/2023 to 12/22/2024  Burnell Blanks, CPhT Rx Patient Advocate Phone: [phone]

## 2023-12-23 NOTE — Telephone Encounter (Signed)
Refills for Amvuttra sent.

## 2023-12-23 NOTE — Telephone Encounter (Signed)
Advanced Heart Failure Patient Advocate Encounter  Test claims returned the following information:  Donna Fuller requires prior authorization Farxiga $0 for 30 or 90 days Jardiance $0 for 30 or 90 days  Donna Fuller, CPhT Rx Patient Advocate Phone: [phone]

## 2023-12-24 ENCOUNTER — Telehealth: Payer: Self-pay | Admitting: Medical

## 2023-12-24 MED ORDER — VUTRISIRAN SODIUM 25 MG/0.5ML ~~LOC~~ SOSY
25.0000 mg | PREFILLED_SYRINGE | Freq: Once | SUBCUTANEOUS | Status: AC
  Administered 2023-12-23: 25 mg via SUBCUTANEOUS

## 2023-12-24 NOTE — Telephone Encounter (Signed)
Copied from CRM [id number]. Topic: Referral - Question >> Dec 24, 2023  2:34 PM Donna Fuller wrote: Reason for CRM: *no question* Patient called in to state the doctor referred her to [facility] in [place], but is already going to one in [address]. Wanted to advise she doesn't need referral to [facility]

## 2023-12-27 ENCOUNTER — Other Ambulatory Visit: Payer: Self-pay | Admitting: Internal Medicine

## 2023-12-27 ENCOUNTER — Other Ambulatory Visit
Admission: RE | Admit: 2023-12-27 | Discharge: 2023-12-27 | Disposition: A | Discharge status: Home / Self Care | Payer: PPO | Attending: Cardiology | Admitting: Cardiology

## 2023-12-27 DIAGNOSIS — I43 Cardiomyopathy in diseases classified elsewhere: Secondary | ICD-10-CM | POA: Diagnosis not present

## 2023-12-27 DIAGNOSIS — E854 Organ-limited amyloidosis: Secondary | ICD-10-CM | POA: Diagnosis not present

## 2023-12-27 LAB — BASIC METABOLIC PANEL
Anion gap: 11 (ref 5–15)
BUN: 34 mg/dL — ABNORMAL HIGH (ref 8–23)
CO2: 27 mmol/L (ref 22–32)
Calcium: 9.4 mg/dL (ref 8.9–10.3)
Chloride: 102 mmol/L (ref 98–111)
Creatinine, Ser: 1.59 mg/dL — ABNORMAL HIGH (ref 0.44–1.00)
GFR, Estimated: 32 mL/min — ABNORMAL LOW (ref >60)
Glucose, Bld: 194 mg/dL — ABNORMAL HIGH (ref 70–99)
Potassium: 4 mmol/L (ref 3.5–5.1)
Sodium: 140 mmol/L (ref 135–145)

## 2023-12-28 ENCOUNTER — Telehealth (HOSPITAL_COMMUNITY): Payer: Self-pay | Admitting: *Deleted

## 2023-12-28 NOTE — Telephone Encounter (Signed)
Patient reminded of upcoming Amyloid scan. Donna Fuller

## 2023-12-30 ENCOUNTER — Inpatient Hospital Stay: Payer: PPO | Admitting: Internal Medicine

## 2023-12-30 ENCOUNTER — Telehealth: Payer: Self-pay

## 2023-12-30 NOTE — Telephone Encounter (Signed)
Dr. Alena Bills wanted me to let the schedulers know that this patient is no longer going to be under our care, due to her just going to be under her cardiologists care. Britta Mccreedy, our scheduler went ahead and removed her from our schedule and notified the patient in regards to all of this information, I just wanted to document all of this myself.

## 2023-12-31 ENCOUNTER — Ambulatory Visit: Payer: PPO | Admitting: Cardiology

## 2024-01-04 ENCOUNTER — Telehealth: Payer: Self-pay

## 2024-01-04 NOTE — Telephone Encounter (Signed)
Unsuccessful attempts to reach patient on preferred number listed in notes for scheduled AWV. Left message on voicemail okay to reschedule.

## 2024-01-05 ENCOUNTER — Ambulatory Visit (HOSPITAL_COMMUNITY): Payer: PPO | Attending: Cardiology

## 2024-01-05 ENCOUNTER — Telehealth: Payer: Self-pay

## 2024-01-05 DIAGNOSIS — I5032 Chronic diastolic (congestive) heart failure: Secondary | ICD-10-CM | POA: Diagnosis not present

## 2024-01-05 MED ORDER — TECHNETIUM TC 99M PYROPHOSPHATE
20.2000 | Freq: Once | INTRAVENOUS | Status: AC
  Administered 2024-01-05: 20.2 via INTRAVENOUS

## 2024-01-05 NOTE — Telephone Encounter (Signed)
 Patient called to confirm her appointment for 01/12/24

## 2024-01-06 ENCOUNTER — Ambulatory Visit: Payer: PPO | Admitting: Medical

## 2024-01-06 LAB — MYOCARDIAL AMYLOID PLANAR & SPECT: H/CL Ratio: 2.55

## 2024-01-10 ENCOUNTER — Encounter: Payer: PPO | Admitting: Cardiology

## 2024-01-11 ENCOUNTER — Telehealth: Payer: Self-pay | Admitting: Cardiology

## 2024-01-11 NOTE — Telephone Encounter (Signed)
Pt confirmed appt for 01/12/24

## 2024-01-12 ENCOUNTER — Ambulatory Visit: Payer: PPO | Attending: Cardiology | Admitting: Cardiology

## 2024-01-12 ENCOUNTER — Other Ambulatory Visit (HOSPITAL_COMMUNITY): Payer: Self-pay | Admitting: Cardiology

## 2024-01-12 VITALS — BP 128/64 | HR 74 | Wt 189.0 lb

## 2024-01-12 DIAGNOSIS — E854 Organ-limited amyloidosis: Secondary | ICD-10-CM | POA: Diagnosis not present

## 2024-01-12 DIAGNOSIS — I43 Cardiomyopathy in diseases classified elsewhere: Secondary | ICD-10-CM | POA: Diagnosis not present

## 2024-01-12 DIAGNOSIS — I5032 Chronic diastolic (congestive) heart failure: Secondary | ICD-10-CM | POA: Diagnosis not present

## 2024-01-12 MED ORDER — BUMETANIDE 2 MG PO TABS: 2.0000 mg | ORAL_TABLET | Freq: Two times a day (BID) | ORAL | 0 refills | Status: DC

## 2024-01-12 MED ORDER — BUMETANIDE 1 MG PO TABS: 1.0000 mg | ORAL_TABLET | Freq: Two times a day (BID) | ORAL | 5 refills | Status: DC

## 2024-01-12 NOTE — Patient Instructions (Addendum)
DISCONTINUE COREG (CARVEDILOL)  START BUMEX 2 MG TWICE DAILY FOR 2 DAYS  THEN DECREASE TO 1 MG TWICE DAILY

## 2024-01-12 NOTE — Progress Notes (Signed)
 ADVANCED HEART FAILURE CLINIC NOTE  Referring Physician: Esperanza Richters, PA-C  Primary Care: Esperanza Richters, PA-C  HPI: Donna Fuller is a 83 y.o. 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  Her cardiac history dates back to at least 2022 when she presented to [facility] with complaints of substernal chest pressure.  Patient underwent left heart catheterization that demonstrated only mild nonobstructive CAD of the LAD.  Echocardiogram with moderate to severe LVH and grade 2 diastolic dysfunction with LV ejection fraction of 60 to 65%.  Since that time patient reports she has had a continued decline in her functional status.  She was admitted to [facility] in July and December 2024 with acute on chronic HFpEF exacerbations.  In December 2024 she was diuresed with IV Lasix and discharged home on torsemide 10 mg daily.  She reports having bilateral carpal tunnel syndrome, peripheral neuropathy and low back pain.    Interval history - Doing fairly well; improving strength. Reports increase in weight and LE edema over the past several days.    Past Medical History:  Diagnosis Date   Arthritis    Breast cancer [facility]) 2007   Right breast, s/p mastectomy   Chronic diastolic congestive heart failure (HCC)    per patient, dx by cardiology   Diabetes mellitus without complication (HCC)    GERD (gastroesophageal reflux disease)    Gout    Hyperlipidemia    Hypertension    Spinal stenosis     Current Outpatient Medications  Medication Sig Dispense Refill   acetaminophen (TYLENOL) 650 MG CR tablet Take 650 mg by mouth 2 (two) times daily.     albuterol (VENTOLIN HFA) 108 (90 Base) MCG/ACT inhaler INHALE 2 PUFFS BY MOUTH EVERY 6 HOURS AS NEEDED (Patient taking differently: Inhale 2 puffs into the lungs every 6 (six) hours as needed for wheezing or shortness of breath.) 8.5 each 2   Alcohol  Swabs (ALCOHOL PREP PAD) 70 % PADS Use to check blood sugars (Patient taking differently: 1 each by Other route See admin instructions. Use to check blood sugars) 100 each 3   ascorbic acid (VITAMIN C) 1000 MG tablet Take 1,000 mg by mouth daily.     aspirin EC 81 MG tablet Take 1 tablet (81 mg total) by mouth daily. Swallow whole. 90 tablet 3   Benzocaine-Benzethonium (LANACANE EX) Apply 1 application  topically daily as needed. Roll-on daily as needed for knee pain.     benzonatate (TESSALON) 100 MG capsule Take 1 capsule (100 mg total) by mouth 3 (three) times daily as needed for cough. 30 capsule 0   blood glucose meter kit and supplies KIT Dispense based on patient and insurance preference. Use up to four times daily as directed. (FOR ICD-9 250.00, 250.01). (Patient taking differently: Inject 1 each into the skin as directed. Dispense based on patient and insurance preference. Use up to four times daily as directed. (FOR ICD-9 250.00, 250.01).) 1 each 11   brimonidine (ALPHAGAN) 0.2 % ophthalmic solution Place 1 drop into both eyes 3 (three) times daily.     bumetanide (BUMEX) 1 MG tablet Take 1 mg by mouth daily.     carvedilol (COREG) 3.125 MG tablet Take 1 tablet (3.125 mg total) by mouth 2 (two) times daily with a meal. 60 tablet 3   Cholecalciferol (VITAMIN D-3 PO) Take 2,000 Units by mouth daily.     Cyanocobalamin (B-12 PO) Take  1 tablet by mouth daily.     Dulaglutide (TRULICITY) 1.5 MG/0.5ML SOAJ INJECT 1.5 MG (0.5ML) UNDER THE SKIN ONCE A WEEK 6 mL 3   empagliflozin (JARDIANCE) 10 MG TABS tablet Take 1 tablet (10 mg total) by mouth daily before breakfast. 30 tablet 5   ezetimibe (ZETIA) 10 MG tablet Take 1 tablet (10 mg total) by mouth daily. 90 tablet 3   fluticasone (FLONASE) 50 MCG/ACT nasal spray SPRAY 2 SPRAYS INTO EACH NOSTRIL EVERY DAY 48 mL 1   FLUZONE HIGH-DOSE 0.5 ML injection      gabapentin (NEURONTIN) 300 MG capsule Take 1 capsule (300 mg total) by mouth 3 (three) times  daily. 270 capsule 1   glipiZIDE (GLUCOTROL) 5 MG tablet Take 1 tablet (5 mg total) by mouth 2 (two) times daily before a meal. 180 tablet 3   ketorolac (ACULAR) 0.5 % ophthalmic solution INSTILL 1 DROP INTO RIGHT EYE 4 TIMES A DAY (Patient taking differently: Place 1 drop into the right eye 4 (four) times daily. INSTILL 1 DROP INTO RIGHT EYE 3 TIMES A DAY) 5 mL 6   latanoprost (XALATAN) 0.005 % ophthalmic solution Place 1 drop into both eyes daily.     levocetirizine (XYZAL) 5 MG tablet TAKE 1 TABLET BY MOUTH EVERY DAY IN THE EVENING 90 tablet 1   losartan (COZAAR) 50 MG tablet TAKE 1 TABLET BY MOUTH EVERY DAY 90 tablet 3   omeprazole (PRILOSEC) 40 MG capsule TAKE 1 CAP 2 TIMES DAILY. PLEASE CALL [phone] TO SCHEDULE AN OFFICE VISIT FOR MORE REFILLS. 180 capsule 0   OneTouch Delica Lancets 33G MISC Use as instructed to check blood sugar 2-3 times a day.  DX E11.9 (Patient taking differently: 1 each by Other route See admin instructions. Use as instructed to check blood sugar 2-3 times a day.  DX E11.9) 100 each 3   ONETOUCH ULTRA TEST test strip CHECK BLOOD SUGAR 2 TO 3 TIMES DAILY AS DIRECTED (Patient taking differently: 1 each by Other route as needed for other.) 100 strip 5   timolol (TIMOPTIC) 0.5 % ophthalmic solution Place 1 drop into both eyes 2 (two) times a day.     Vitamin A 2250 MCG (7500 UT) CAPS Take 1 capsule by mouth daily. You can find this over the counter. 30 capsule 11   vutrisiran sodium (AMVUTTRA) 25 MG/0.5ML syringe Inject 0.5 mLs (25 mg total) into the skin every 3 (three) months. 0.5 mL 11   No current facility-administered medications for this visit.   PHYSICAL EXAM: Vitals:   01/12/24 1358  BP: 128/64  Pulse: 74  SpO2: 100%   GENERAL: NAD Lungs- CTA CARDIAC:  JVP: 10 cm          Normal rate with regular rhythm. no murmur.  Pulses 2+. 2+ edema.  ABDOMEN: Soft, non-tender, non-distended.  EXTREMITIES: Warm and well perfused.  NEUROLOGIC: No obvious  FND    DATA REVIEW  ECG: 11/08/23: Normal sinus rhythm and first-degree AV block as per my personal interpretation  ECHO: 11/07/23: LVEF 60 to 65% with severe LVH and grade 3 diastolic dysfunction as per my personal interpretation  CATH: [facility], 2022:    Mid LAD lesion is 20% stenosed.   LV end diastolic pressure is mildly elevated.   1.  Mild nonobstructive coronary artery disease. 2.  Left ventricular angiography was not performed.  EF was normal by echo. 3.  Mildly elevated left ventricular end-diastolic pressure at 18 mmHg.   ASSESSMENT & PLAN:  Evauation for cardiac amyloid  - TTE with restrictive features; severe LVH and myocardium that appears to be secondary to possible cardiac amyloidosis.  -Multiple myeloma panel negative -PYP scan scheduled for early February 2025 -Genetic panel positive for cardiac amyloid, ATTR  -Mildly hypervolemic on exam today; will increase bumex to 2mg  BID  - BMP/BNP in 1 week.  -Extensive discussion today with daughter, husband and son regarding further genetic testing for the family.  In addition discussed starting therapy with tafamidis with Pharm.D. today.  2. HFpEF, restrictive heart disease -Secondary to cardiac amyloid - Will continue bumex; repeat BMP/BNP - Plan to start finerenone at follow up. -Discontinue coreg.   3. Peripheral neuropathy - Reports mild sensory deficits in her fingers with carpal tunnel like symptoms.  - Her A1C has ranged from 6 to 6.8 since 2022. -Her polyneuropathy is secondary to cardiac amyloid.  Will plan on starting therapy.  Discussed with Pharm.D.  4. T2DM - A1C from 6.8 to 6 ranging from 2022 to 2024 - Continue trulicity - will plan to start SGLT2i at follow up.   5. Hypertension  - continue losartan 50mg  daily  6. CKD -Repeat labs today.  I spent 50 minutes caring for this patient today including face to face time, ordering and reviewing labs, we had an extensive discussion involving her son,  daughter and husband with regards to positive genetic testing, impact on family and need for further genetic testing.  We also discussed drug therapy and introduced the family to our Pharm.D. we discussed life expectancy with cardiac amyloid and treatment options.,    Duran Ohern Advanced Heart Failure Mechanical Circulatory Support

## 2024-01-13 ENCOUNTER — Telehealth: Payer: Self-pay

## 2024-01-13 DIAGNOSIS — H401122 Primary open-angle glaucoma, left eye, moderate stage: Secondary | ICD-10-CM | POA: Diagnosis not present

## 2024-01-13 DIAGNOSIS — T1501XA Foreign body in cornea, right eye, initial encounter: Secondary | ICD-10-CM | POA: Diagnosis not present

## 2024-01-13 DIAGNOSIS — H401113 Primary open-angle glaucoma, right eye, severe stage: Secondary | ICD-10-CM | POA: Diagnosis not present

## 2024-01-13 NOTE — Telephone Encounter (Signed)
Pt had missed call for appointment reminder on 01/17/24  Copied from CRM [id number]. Topic: General - Call Back - No Documentation >> Jan 12, 2024  5:12 PM Donna Fuller wrote: Reason for CRM: Patient stated she missed a call from the office. No message found indicating reason for call.

## 2024-01-17 ENCOUNTER — Ambulatory Visit (INDEPENDENT_AMBULATORY_CARE_PROVIDER_SITE_OTHER): Payer: PPO | Admitting: Medical

## 2024-01-17 VITALS — BP 124/68 | HR 74 | Temp 98.0°F | Resp 18 | Ht 61.0 in | Wt 185.0 lb

## 2024-01-17 DIAGNOSIS — M25551 Pain in right hip: Secondary | ICD-10-CM | POA: Diagnosis not present

## 2024-01-17 DIAGNOSIS — J3489 Other specified disorders of nose and nasal sinuses: Secondary | ICD-10-CM | POA: Diagnosis not present

## 2024-01-17 DIAGNOSIS — Z8679 Personal history of other diseases of the circulatory system: Secondary | ICD-10-CM | POA: Diagnosis not present

## 2024-01-17 DIAGNOSIS — J040 Acute laryngitis: Secondary | ICD-10-CM | POA: Diagnosis not present

## 2024-01-17 MED ORDER — AZITHROMYCIN 250 MG PO TABS: ORAL_TABLET | ORAL | 0 refills | Status: AC

## 2024-01-17 NOTE — Patient Instructions (Signed)
Hoarse Voice Chronic hoarseness for a month with no overuse of voice or recent upper respiratory infection. No smoking history for over 40 years. But about one month of sinus pressure. More on rt side. -Start Azithromycin for potential sinus infection. -Continue Flonase nasal spray. -Refer to ENT for potential laryngoscopy .  Right Hip Pain Chronic right hip pain worsening over the past month. No recent trauma. Pain with movement and prolonged sitting. -Order future hip x-ray to evaluate joint space and potential causes of pain.(you declined to get xray today)  Cardiac Amyloidosis and Congestive Heart Failure Stable under care of heart failure clinic. Recent adjustment of diuretic due to fluid buildup. -Continue current management under heart failure clinic. -Contact primary care if symptoms worsen between cardiology visits.  General Health Maintenance -Consider Shingrix vaccine and pcv 20 vaccine thru pharmacy.  Follow up one month or sooner if needed

## 2024-01-17 NOTE — Progress Notes (Signed)
Subjective:    Patient ID: Donna Fuller, female    DOB: 11/26/1941, 83 y.o.   MRN: [id number]  HPI Discussed the use of AI scribe software for clinical note transcription with the patient, who gave verbal consent to proceed.  History of Present Illness   Donna Fuller is an 83 year old female with cardiac amyloidosis and congestive heart failure who presents with hoarseness and hip pain.  She has been experiencing hoarseness for the past month, describing her voice as consistently hoarse, with some mornings where she can only speak above a whisper. Her voice tends to improve after noon. There is no overuse of her voice, such as singing or excessive talking, and she has not smoked for over forty years. She has been using home remedies like honey and lemon but is tired of them. She also reports sinus pressure, particularly on the right side, which has been present for over a month.  She reports hip pain that has been worsening over the past month, although the pain has been present for longer. The pain is located in her hip and worsens with sitting for long periods and when walking. There have been no recent falls or injuries that could have caused the pain, although she mentions having fallen a couple of times in the past. The pain is severe enough to wake her at night. She is currently taking Tylenol for the pain due to kidney function concerns.  She has a history of cardiac amyloidosis and congestive heart failure, for which she is under the care of a heart failure clinic. She was started on medication for her heart condition in January and has had adjustments to her diuretic medication due to fluid buildup. She is currently taking Bumex 1 mg twice a day after a brief increase to 2 mg twice a day for two days.  She mentions being under the care of a kidney doctor.Marland Kitchen Her husband frequently visits an ENT specialist, whom she has met and likes.         Declines shingrix vaccine.  Explained  can get thru pharmacy later.   Review of Systems  Constitutional:  Negative for chills, fatigue and fever.  HENT:  Positive for sinus pressure. Negative for congestion, sore throat and trouble swallowing.        Laryngitis  Respiratory:  Negative for cough, chest tightness and wheezing.   Cardiovascular:  Negative for chest pain and palpitations.  Gastrointestinal:  Negative for abdominal pain, blood in stool and rectal pain.  Genitourinary:  Negative for dysuria.  Musculoskeletal:        Rt hip pain.  Skin:  Negative for rash.  Neurological:  Negative for dizziness and light-headedness.  Hematological:  Negative for adenopathy. Does not bruise/bleed easily.  Psychiatric/Behavioral:  Negative for behavioral problems and hallucinations.     Past Medical History:  Diagnosis Date   Arthritis    Breast cancer [facility]) 2007   Right breast, s/p mastectomy   Chronic diastolic congestive heart failure (HCC)    per patient, dx by cardiology   Diabetes mellitus without complication (HCC)    GERD (gastroesophageal reflux disease)    Gout    Hyperlipidemia    Hypertension    Spinal stenosis      Social History   Socioeconomic History   Marital status: Married    Spouse name: Jimmy   Number of children: 3   Years of education: 12   Highest education level: Not on file  Occupational History   Occupation: Retired  Tobacco Use   Smoking status: Former    Types: Cigarettes    Passive exposure: Never   Smokeless tobacco: Never  Vaping Use   Vaping status: Never Used  Substance and Sexual Activity   Alcohol use: No   Drug use: No   Sexual activity: Not Currently  Other Topics Concern   Not on file  Social History Narrative   Marital Status:  Married Hydrologist)    Children:  Daughter(1) Son (1)    Pets:  None    Living Situation: Lives with husband and daughter.     Occupation:  Retired Contractor)    Education: 12th Grade    Tobacco Use/Exposure:  She used to smoke socially during  the weekends but quit 40 years ago.    Alcohol Use:  Occasional   Drug Use:  None   Diet:  Regular   Exercise:  None   Hobbies:  Reading and playing volleyball             Social Drivers of Health   Financial Resource Strain: Low Risk  (11/08/2023)   Overall Financial Resource Strain (CARDIA)    Difficulty of Paying Living Expenses: Not hard at all  Food Insecurity: No Food Insecurity (11/09/2023)   Hunger Vital Sign    Worried About Running Out of Food in the Last Year: Never true    Ran Out of Food in the Last Year: Never true  Transportation Needs: No Transportation Needs (11/09/2023)   PRAPARE - Administrator, Civil Service (Medical): No    Lack of Transportation (Non-Medical): No  Physical Activity: Inactive (10/01/2021)   Exercise Vital Sign    Days of Exercise per Week: 0 days    Minutes of Exercise per Session: 0 min  Stress: No Stress Concern Present (10/01/2021)   [employer] of Occupational Health - Occupational Stress Questionnaire    Feeling of Stress : Not at all  Social Connections: Moderately Integrated (10/01/2021)   Social Connection and Isolation Panel [NHANES]    Frequency of Communication with Friends and Family: More than three times a week    Frequency of Social Gatherings with Friends and Family: More than three times a week    Attends Religious Services: More than 4 times per year    Active Member of [employer] or Organizations: No    Attends Banker Meetings: Never    Marital Status: Married  Catering manager Violence: Not At Risk (11/09/2023)   Humiliation, Afraid, Rape, and Kick questionnaire    Fear of Current or Ex-Partner: No    Emotionally Abused: No    Physically Abused: No    Sexually Abused: No    Past Surgical History:  Procedure Laterality Date   ABDOMINAL HYSTERECTOMY     ABDOMINAL SURGERY     BREAST BIOPSY Left    2016 benign    BREAST BIOPSY Left 02/16/2023   MM LT BREAST BX W LOC DEV 1ST LESION IMAGE BX  SPEC STEREO GUIDE 02/16/2023 [facility] M[facility]MO[facility]   LEFT HEART CATH AND CORONARY ANGIOGRAPHY N/A 11/21/2021   Procedure: LEFT HEART CATH AND CORONARY ANGIOGRAPHY;  Surgeon: Iran Ouch, MD;  Location: [facility] INVASIVE CV LAB;  Service: Cardiovascular;  Laterality: N/A;   MASTECTOMY     right side   REPLACEMENT TOTAL KNEE Bilateral    TUBAL LIGATION      Family History  Problem Relation Age of Onset   Diabetes Mother  Alzheimer's disease Mother    Hypertension Mother    Hypertension Father    Heart disease Father    Hyperlipidemia Father    Kidney disease Father    Diabetes Sister    Cancer Brother        lung   Alzheimer's disease Brother    Heart disease Brother    Hypertension Brother    Diabetes Brother    Hypertension Brother    Diabetes Brother    Hyperlipidemia Son    Hypertension Son    Diabetes Son    Hypertension Daughter    Hyperlipidemia Daughter     Allergies  Allergen Reactions   Penicillins Swelling   Sulfa Antibiotics Swelling   Amlodipine Swelling    LE Swelling   Lisinopril Cough    Current Outpatient Medications on File Prior to Visit  Medication Sig Dispense Refill   acetaminophen (TYLENOL) 650 MG CR tablet Take 650 mg by mouth 2 (two) times daily.     albuterol (VENTOLIN HFA) 108 (90 Base) MCG/ACT inhaler INHALE 2 PUFFS BY MOUTH EVERY 6 HOURS AS NEEDED (Patient taking differently: Inhale 2 puffs into the lungs every 6 (six) hours as needed for wheezing or shortness of breath.) 8.5 each 2   Alcohol Swabs (ALCOHOL PREP PAD) 70 % PADS Use to check blood sugars (Patient taking differently: 1 each by Other route See admin instructions. Use to check blood sugars) 100 each 3   ascorbic acid (VITAMIN C) 1000 MG tablet Take 1,000 mg by mouth daily.     aspirin EC 81 MG tablet Take 1 tablet (81 mg total) by mouth daily. Swallow whole. 90 tablet 3   Benzocaine-Benzethonium (LANACANE EX) Apply 1 application  topically daily as needed. Roll-on daily as  needed for knee pain.     benzonatate (TESSALON) 100 MG capsule Take 1 capsule (100 mg total) by mouth 3 (three) times daily as needed for cough. 30 capsule 0   blood glucose meter kit and supplies KIT Dispense based on patient and insurance preference. Use up to four times daily as directed. (FOR ICD-9 250.00, 250.01). (Patient taking differently: Inject 1 each into the skin as directed. Dispense based on patient and insurance preference. Use up to four times daily as directed. (FOR ICD-9 250.00, 250.01).) 1 each 11   brimonidine (ALPHAGAN) 0.2 % ophthalmic solution Place 1 drop into both eyes 3 (three) times daily.     bumetanide (BUMEX) 1 MG tablet Take 1 mg by mouth daily.     bumetanide (BUMEX) 1 MG tablet Take 1 tablet (1 mg total) by mouth 2 (two) times daily. 60 tablet 5   bumetanide (BUMEX) 2 MG tablet Take 1 tablet (2 mg total) by mouth 2 (two) times daily for 2 days. 4 tablet 0   Cholecalciferol (VITAMIN D-3 PO) Take 2,000 Units by mouth daily.     Cyanocobalamin (B-12 PO) Take 1 tablet by mouth daily.     Dulaglutide (TRULICITY) 1.5 MG/0.5ML SOAJ INJECT 1.5 MG (0.5ML) UNDER THE SKIN ONCE A WEEK 6 mL 3   empagliflozin (JARDIANCE) 10 MG TABS tablet Take 1 tablet (10 mg total) by mouth daily before breakfast. 30 tablet 5   ezetimibe (ZETIA) 10 MG tablet Take 1 tablet (10 mg total) by mouth daily. 90 tablet 3   fluticasone (FLONASE) 50 MCG/ACT nasal spray SPRAY 2 SPRAYS INTO EACH NOSTRIL EVERY DAY 48 mL 1   FLUZONE HIGH-DOSE 0.5 ML injection      gabapentin (NEURONTIN) 300 MG  capsule Take 1 capsule (300 mg total) by mouth 3 (three) times daily. 270 capsule 1   glipiZIDE (GLUCOTROL) 5 MG tablet Take 1 tablet (5 mg total) by mouth 2 (two) times daily before a meal. 180 tablet 3   ketorolac (ACULAR) 0.5 % ophthalmic solution INSTILL 1 DROP INTO RIGHT EYE 4 TIMES A DAY (Patient taking differently: Place 1 drop into the right eye 4 (four) times daily. INSTILL 1 DROP INTO RIGHT EYE 3 TIMES A DAY) 5  mL 6   latanoprost (XALATAN) 0.005 % ophthalmic solution Place 1 drop into both eyes daily.     levocetirizine (XYZAL) 5 MG tablet TAKE 1 TABLET BY MOUTH EVERY DAY IN THE EVENING 90 tablet 1   losartan (COZAAR) 50 MG tablet TAKE 1 TABLET BY MOUTH EVERY DAY 90 tablet 3   omeprazole (PRILOSEC) 40 MG capsule TAKE 1 CAP 2 TIMES DAILY. PLEASE CALL [phone] TO SCHEDULE AN OFFICE VISIT FOR MORE REFILLS. 180 capsule 0   OneTouch Delica Lancets 33G MISC Use as instructed to check blood sugar 2-3 times a day.  DX E11.9 (Patient taking differently: 1 each by Other route See admin instructions. Use as instructed to check blood sugar 2-3 times a day.  DX E11.9) 100 each 3   ONETOUCH ULTRA TEST test strip CHECK BLOOD SUGAR 2 TO 3 TIMES DAILY AS DIRECTED (Patient taking differently: 1 each by Other route as needed for other.) 100 strip 5   timolol (TIMOPTIC) 0.5 % ophthalmic solution Place 1 drop into both eyes 2 (two) times a day.     Vitamin A 2250 MCG (7500 UT) CAPS Take 1 capsule by mouth daily. You can find this over the counter. 30 capsule 11   vutrisiran sodium (AMVUTTRA) 25 MG/0.5ML syringe Inject 0.5 mLs (25 mg total) into the skin every 3 (three) months. 0.5 mL 11   No current facility-administered medications on file prior to visit.    BP 124/68   Pulse 74   Temp 98 F (36.7 C)   Resp 18   Ht 5\' 1"  (1.549 m)   Wt 185 lb (83.9 kg)   SpO2 97%   BMI 34.96 kg/m        Objective:   Physical Exam  General Mental Status- Alert. General Appearance- Not in acute distress.   Skin General: Color- Normal Color. Moisture- Normal Moisture.  Neck Carotid Arteries- Normal color. Moisture- Normal Moisture. No carotid bruits. No JVD.  Chest and Lung Exam Auscultation: Breath Sounds:-CTA  Cardiovascular Auscultation:Rythm- RRR Murmurs & Other Heart Sounds:Auscultation of the heart reveals- No Murmurs.  Abdomen Inspection:-Inspeection Normal. Palpation/Percussion:Note:No mass. Palpation  and Percussion of the abdomen reveal- Non Tender, Non Distended + BS, no rebound or guarding.   Neurologic Cranial Nerve exam:- CN III-XII intact(No nystagmus), symmetric smile. Strength:- 5/5 equal and symmetric strength both upper and lower extremities.   Lower ext- lower ext- no pedal edema.  Rt hip- hip pain on palpation. Pain on ambulation.   Heent- mild rt side maxillary sinus and ethomoid area pain. Posterior pharynx normal. Does have moderate hoarse voice.    Assessment & Plan:   Hoarse Voice Chronic hoarseness for a month with no overuse of voice or recent upper respiratory infection. No smoking history for over 40 years. But about one month of sinus pressure. More on rt side. -Start Azithromycin for potential sinus infection. -Continue Flonase nasal spray. -Refer to ENT for potential laryngoscopy .  Right Hip Pain Chronic right hip pain worsening over the  past month. No recent trauma. Pain with movement and prolonged sitting. -Order future hip x-ray to evaluate joint space and potential causes of pain.(you declined to get xray today)  Cardiac Amyloidosis and Congestive Heart Failure Stable under care of heart failure clinic. Recent adjustment of diuretic due to fluid buildup. -Continue current management under heart failure clinic. -Contact primary care if symptoms worsen between cardiology visits.  General Health Maintenance -Consider Shingrix vaccine and pcv 20 vaccine thru pharmacy.  Follow up one month or sooner if needed  Esperanza Richters, PA-C   Time spent with patient today was 45  minutes which consisted of chart review, discussing diagnosis, work up treatment and documentation.

## 2024-01-18 ENCOUNTER — Telehealth: Payer: Self-pay

## 2024-01-18 NOTE — Telephone Encounter (Signed)
Copied from CRM [id number]. Topic: Referral - Question >> Jan 18, 2024 10:16 AM Donna Fuller wrote: Reason for CRM: Patient called in to give provider name and number of the ENT ,  [facility] [phone] [address], [place], [place] [address]

## 2024-01-18 NOTE — Telephone Encounter (Signed)
Can you update the referral for ENT?

## 2024-01-19 NOTE — Telephone Encounter (Signed)
Referral sent to:  [facility] [phone] [address], [place], [place] [address]

## 2024-01-21 ENCOUNTER — Ambulatory Visit (HOSPITAL_BASED_OUTPATIENT_CLINIC_OR_DEPARTMENT_OTHER)
Admission: RE | Admit: 2024-01-21 | Discharge: 2024-01-21 | Disposition: A | Discharge status: Home / Self Care | Payer: PPO | Source: Ambulatory Visit | Attending: Medical | Admitting: Medical

## 2024-01-21 DIAGNOSIS — M069 Rheumatoid arthritis, unspecified: Secondary | ICD-10-CM | POA: Diagnosis not present

## 2024-01-21 DIAGNOSIS — M1611 Unilateral primary osteoarthritis, right hip: Secondary | ICD-10-CM | POA: Diagnosis not present

## 2024-01-21 DIAGNOSIS — M51369 Other intervertebral disc degeneration, lumbar region without mention of lumbar back pain or lower extremity pain: Secondary | ICD-10-CM | POA: Diagnosis not present

## 2024-01-21 DIAGNOSIS — M25551 Pain in right hip: Secondary | ICD-10-CM

## 2024-01-22 ENCOUNTER — Encounter: Payer: Self-pay | Admitting: Medical

## 2024-01-22 NOTE — Addendum Note (Signed)
 Addended by: Gwenevere Abbot on: 01/22/2024 06:48 AM   Modules accepted: Orders

## 2024-01-25 ENCOUNTER — Telehealth: Payer: Self-pay | Admitting: Cardiology

## 2024-01-25 NOTE — Progress Notes (Signed)
 Office Visit Note  Patient: Donna Fuller             Date of Birth: 1941-07-22           MRN: [id number]             PCP: Marisue Brooklyn Referring: Marisue Brooklyn Visit Date: 02/08/2024 Occupation: @GUAROCC @  Subjective:  Pain in multiple joints  History of Present Illness: Donna Fuller is a 83 y.o. female with seropositive rheumatoid arthritis, degenerative disc disease and osteoarthritis.  She returns today after last visit in September 2024.  She states she continues to have pain and discomfort in her both hands and her both knees.  Both knees are replaced.  Patient has been off immunosuppressive agents for a long time.  She continues to have lower back pain.  She has not noticed any difference in her arthritis over the last year.    Activities of Daily Living:  Patient reports morning stiffness for all day.  Patient Reports nocturnal pain.  Difficulty dressing/grooming: Denies Difficulty climbing stairs: Reports Difficulty getting out of chair: Denies Difficulty using hands for taps, buttons, cutlery, and/or writing: Reports  Review of Systems  Constitutional:  Positive for fatigue.  HENT:  Positive for mouth dryness. Negative for mouth sores and nose dryness.   Eyes:  Positive for dryness. Negative for pain.  Respiratory:  Positive for shortness of breath and difficulty breathing.   Cardiovascular:  Negative for chest pain and palpitations.  Gastrointestinal:  Positive for constipation. Negative for blood in stool and diarrhea.  Endocrine: Negative for increased urination.  Genitourinary:  Negative for involuntary urination.  Musculoskeletal:  Positive for joint pain, gait problem, joint pain, joint swelling, myalgias, muscle weakness, morning stiffness, muscle tenderness and myalgias.  Skin:  Negative for color change, rash, hair loss and sensitivity to sunlight.  Allergic/Immunologic: Positive for susceptible to infections.  Neurological:  Positive  for headaches. Negative for dizziness.  Hematological:  Negative for swollen glands.  Psychiatric/Behavioral:  Positive for sleep disturbance. Negative for depressed mood. The patient is not nervous/anxious.     PMFS History:  Patient Active Problem List   Diagnosis Date Noted   Left ventricular hypertrophy 12/10/2023   Demand ischemia of myocardium (HCC) 11/06/2023   Thrombocytopenia (HCC) 11/06/2023   Obesity, Class III, BMI 40-49.9 (morbid obesity) (HCC) 11/06/2023   Respiratory tract infection 11/06/2023   Rheumatoid arthritis (HCC) 11/06/2023   Anemia in chronic kidney disease 09/20/2023   Benign hypertensive kidney disease with chronic kidney disease 09/20/2023   Proteinuria 09/20/2023   (HFpEF) heart failure with preserved ejection fraction (HCC) 06/25/2023   Known medical problems 06/12/2023   Acute on chronic diastolic heart failure (HCC) 06/12/2023   Chest pain with moderate risk for cardiac etiology 06/11/2023   Type 2 diabetes mellitus without complication (HCC) 04/17/2022   Angina pectoris (HCC) 11/21/2021   Diabetes mellitus type 2 with complications (HCC) 11/21/2021   Coronary artery disease, non-occlusive 11/21/2021   LV hypertrophy, hypertensive 11/21/2021   Elevated troponin 11/21/2021   Atypical chest pain 11/20/2021   DDD (degenerative disc disease), lumbar 11/03/2021   Rheumatoid factor positive 07/29/2021   History of total bilateral knee replacement 07/29/2021   Other idiopathic scoliosis, thoracic region 07/08/2021   Estrogen deficiency 07/08/2021   Arthralgia of both hands 07/08/2021   Moderate nonproliferative diabetic retinopathy of left eye (HCC) 02/11/2021   Pseudophakia 02/11/2021   Primary open angle glaucoma of both eyes, moderate stage 02/11/2021   Cystoid  macular edema, right eye 02/11/2021   Type 2 diabetes mellitus with hyperglycemia, without long-term current use of insulin (HCC) 12/14/2019   Type 2 diabetes mellitus with diabetic  polyneuropathy, without long-term current use of insulin (HCC) 12/14/2019   Pleuritic chest pain 05/30/2019   Fever 05/30/2019   Yeast vaginitis 07/19/2017   Urinary frequency 07/19/2017   Stage 3b chronic kidney disease (HCC) 05/20/2017   Gout 05/20/2017   Hyperlipidemia 09/26/2016   Spinal stenosis 09/26/2016   History of breast cancer 09/26/2016   Diabetes mellitus type 2, controlled (HCC) 09/26/2016   GERD (gastroesophageal reflux disease) 09/26/2016   Facet arthropathy, lumbosacral 03/13/2015   Neuropathy 03/13/2015   Hypertension    Lumbar radiculopathy 01/08/2014   Ductal carcinoma in situ (DCIS) of right breast 10/05/2011   Pain in joint, pelvic region and thigh 08/24/2011    Past Medical History:  Diagnosis Date   Arthritis    Breast cancer (HCC) 2007   Right breast, s/p mastectomy   Chronic diastolic congestive heart failure (HCC)    per patient, dx by cardiology   Diabetes mellitus without complication (HCC)    GERD (gastroesophageal reflux disease)    Gout    Hyperlipidemia    Hypertension    Spinal stenosis     Family History  Problem Relation Age of Onset   Diabetes Mother    Alzheimer's disease Mother    Hypertension Mother    Hypertension Father    Heart disease Father    Hyperlipidemia Father    Kidney disease Father    Diabetes Sister    Cancer Brother        lung   Alzheimer's disease Brother    Heart disease Brother    Hypertension Brother    Diabetes Brother    Hypertension Brother    Diabetes Brother    Hyperlipidemia Son    Hypertension Son    Diabetes Son    Hypertension Daughter    Hyperlipidemia Daughter    Past Surgical History:  Procedure Laterality Date   ABDOMINAL HYSTERECTOMY     ABDOMINAL SURGERY     BREAST BIOPSY Left    2016 benign    BREAST BIOPSY Left 02/16/2023   MM LT BREAST BX W LOC DEV 1ST LESION IMAGE BX SPEC STEREO GUIDE 02/16/2023 [facility] MAMMOGRAPHY   LEFT HEART CATH AND CORONARY ANGIOGRAPHY N/A 11/21/2021    Procedure: LEFT HEART CATH AND CORONARY ANGIOGRAPHY;  Surgeon: Iran Ouch, MD;  Location: [facility] INVASIVE CV LAB;  Service: Cardiovascular;  Laterality: N/A;   MASTECTOMY     right side   REPLACEMENT TOTAL KNEE Bilateral    TUBAL LIGATION     Social History   Social History Narrative   Marital Status:  Married Hydrologist)    Children:  Daughter(1) Son (1)    Pets:  None    Living Situation: Lives with husband and daughter.     Occupation:  Retired Contractor)    Education: 12th Grade    Tobacco Use/Exposure:  She used to smoke socially during the weekends but quit 40 years ago.    Alcohol Use:  Occasional   Drug Use:  None   Diet:  Regular   Exercise:  None   Hobbies:  Reading and playing volleyball             Immunization History  Administered Date(s) Administered   Fluad Quad(high Dose 65+) 08/11/2022   Fluzone Influenza virus vaccine,trivalent (IIV3), split virus 09/16/2010, 10/05/2011   Influenza,  High Dose Seasonal PF 09/10/2015, 08/31/2018, 08/23/2023   Influenza,inj,Quad PF,6+ Mos 08/30/2016   Influenza,inj,quad, With Preservative 09/11/2014   Influenza-Unspecified 08/18/2021, 08/15/2022   Moderna Covid-19 Vaccine Bivalent Booster 51yrs & up 08/29/2021   Moderna Sars-Covid-2 Vaccination 12/25/2019, 01/11/2020, 02/05/2020   Pneumococcal Conjugate-13 05/22/2015   Pneumococcal Polysaccharide-23 10/06/2021   Td 04/05/2023     Objective: Vital Signs: BP 111/75 (BP Location: Left Arm, Patient Position: Sitting, Cuff Size: Normal)   Pulse 69   Resp 15   Ht 5\' 1"  (1.549 m)   Wt 184 lb 9.6 oz (83.7 kg)   BMI 34.88 kg/m    Physical Exam Vitals and nursing note reviewed.  Constitutional:      Appearance: She is well-developed.  HENT:     Head: Normocephalic and atraumatic.  Eyes:     Conjunctiva/sclera: Conjunctivae normal.  Cardiovascular:     Rate and Rhythm: Normal rate and regular rhythm.     Heart sounds: Normal heart sounds.  Pulmonary:     Effort: Pulmonary  effort is normal.     Breath sounds: Normal breath sounds.  Abdominal:     General: Bowel sounds are normal.     Palpations: Abdomen is soft.  Musculoskeletal:     Cervical back: Normal range of motion.  Lymphadenopathy:     Cervical: No cervical adenopathy.  Skin:    General: Skin is warm and dry.     Capillary Refill: Capillary refill takes less than 2 seconds.  Neurological:     Mental Status: She is alert and oriented to person, place, and time.  Psychiatric:        Behavior: Behavior normal.      Musculoskeletal Exam: Patient was examined in the seated position.  She had limited range of motion of the cervical spine.  Lumbar spine could not be assessed.  Shoulder joint abduction was limited to about 100 degrees bilaterally.  Elbow joints in good range of motion.  She had contractures in her PIP and DIP joints with incomplete extension of her fingers.  No MCP synovitis was noted.  Hip joints could not be assessed.  Knee joints were replaced and were in good range of motion.  There was no tenderness over ankles or MTPs.  CDAI Exam: CDAI Score: -- Patient Global: --; Provider Global: -- Swollen: --; Tender: -- Joint Exam 02/08/2024   No joint exam has been documented for this visit   There is currently no information documented on the homunculus. Go to the Rheumatology activity and complete the homunculus joint exam.  Investigation: No additional findings.  Imaging: DG HIP UNILAT WITH PELVIS 2-3 VIEWS RIGHT Result Date: 01/21/2024 CLINICAL DATA:  Posterior right hip pain, 2 falls last year, rheumatoid arthritis EXAM: DG HIP (WITH OR WITHOUT PELVIS) 2-3V RIGHT COMPARISON:  12/13/2013 pelvic and right hip radiographs FINDINGS: No pelvic fracture or diastasis. No right hip fracture or dislocation. Mild right hip osteoarthritis, minimally worsened. Marked degenerative disc disease in the lower lumbar spine. No suspicious focal osseous lesions. No radiopaque foreign bodies.  IMPRESSION: 1. No acute osseous abnormality. 2. Mild right hip osteoarthritis, minimally worsened since 2015. 3. Marked degenerative disc disease in the lower lumbar spine. Electronically Signed   By: Delbert Phenix M.D.   On: 01/21/2024 13:25    Recent Labs: Lab Results  Component Value Date   WBC 6.6 12/09/2023   HGB 13.1 12/09/2023   PLT 151 12/09/2023   NA 140 12/27/2023   K 4.0 12/27/2023   CL  102 12/27/2023   CO2 27 12/27/2023   GLUCOSE 194 (H) 12/27/2023   BUN 34 (H) 12/27/2023   CREATININE 1.59 (H) 12/27/2023   BILITOT 0.9 12/09/2023   ALKPHOS 134 (H) 12/09/2023   AST 19 12/09/2023   ALT 15 12/09/2023   PROT 8.1 12/09/2023   ALBUMIN 4.2 12/09/2023   CALCIUM 9.4 12/27/2023   GFRAA 51 (L) 06/03/2019   QFTBGOLDPLUS NEGATIVE 12/04/2021    Speciality Comments: No specialty comments available.  Procedures:  No procedures performed Allergies: Penicillins, Sulfa antibiotics, Amlodipine, and Lisinopril   Assessment / Plan:     Visit Diagnoses: Seropositive rheumatoid arthritis (HCC) - RF+, Erosive arthritis, history of extensor tenosynovitis.  She continues to have pain and discomfort in her hands.  No synovitis was noted on the examination.  She denies any history of increased joint swelling.  She continues to be in discomfort.  Currently not on any immunosuppressive agents.  Her rheumatoid arthritis remains in remission.  Patient was advised to contact us if she develops any increased swelling.  High risk medication use - Not currently taking any immunosuppressive agents.  Not a good candidate for TNF inhibitors in the future due to pain diagnosed with heart failure  History of total bilateral knee replacement -she continues to have discomfort in the bilateral knee joints.  She had knee joint replacement in 2013 by Dr. Christell Constant in [place].  Patient was advised to schedule an appointment with the orthopedic surgeon.  Other idiopathic scoliosis, thoracic region-she had no point  tenderness.  Spondylosis of lumbar spine-she can history of chronic discomfort.  Primary hypertension-blood pressure was normal today at 111/75.  History of hyperlipidemia  Stage 3a chronic kidney disease (HCC)  Type 2 diabetes mellitus with diabetic polyneuropathy, without long-term current use of insulin (HCC)  Moderate nonproliferative diabetic retinopathy of left eye without macular edema associated with type 2 diabetes mellitus (HCC)  History of breast cancer  Gastroesophageal reflux disease without esophagitis  Primary open angle glaucoma of both eyes, moderate stage  Chronic diastolic congestive heart failure (HCC) - Newly diagnosed after recent hospitalization.  Under care of cardiology.  Orders: No orders of the defined types were placed in this encounter.  No orders of the defined types were placed in this encounter.   Follow-Up Instructions: Return in about 1 year (around 02/07/2025) for Rheumatoid arthritis, Osteoarthritis.   Pollyann Savoy, MD  Note - This record has been created using Animal nutritionist.  Chart creation errors have been sought, but may not always  have been located. Such creation errors do not reflect on  the standard of medical care.

## 2024-01-25 NOTE — Telephone Encounter (Signed)
 Called and notified she has a 6 month supply of medication at her pharmacy. Pt states she had not reached out to her pharmacy yet for the refills and would do so.  01/25/2024 10:00 AM EST by Octaviano Batty  Incoming Svara, Twyman T (Self) [phone] (Home)   pt needs refill on [facility]et[facility]ide [facility] [place]

## 2024-01-26 DIAGNOSIS — D631 Anemia in chronic kidney disease: Secondary | ICD-10-CM | POA: Diagnosis not present

## 2024-01-26 DIAGNOSIS — R809 Proteinuria, unspecified: Secondary | ICD-10-CM | POA: Diagnosis not present

## 2024-01-26 DIAGNOSIS — E1122 Type 2 diabetes mellitus with diabetic chronic kidney disease: Secondary | ICD-10-CM | POA: Diagnosis not present

## 2024-01-26 DIAGNOSIS — I129 Hypertensive chronic kidney disease with stage 1 through stage 4 chronic kidney disease, or unspecified chronic kidney disease: Secondary | ICD-10-CM | POA: Diagnosis not present

## 2024-01-26 DIAGNOSIS — E785 Hyperlipidemia, unspecified: Secondary | ICD-10-CM | POA: Diagnosis not present

## 2024-01-26 DIAGNOSIS — N1832 Chronic kidney disease, stage 3b: Secondary | ICD-10-CM | POA: Diagnosis not present

## 2024-01-31 DIAGNOSIS — E785 Hyperlipidemia, unspecified: Secondary | ICD-10-CM | POA: Diagnosis not present

## 2024-01-31 DIAGNOSIS — I129 Hypertensive chronic kidney disease with stage 1 through stage 4 chronic kidney disease, or unspecified chronic kidney disease: Secondary | ICD-10-CM | POA: Diagnosis not present

## 2024-01-31 DIAGNOSIS — R809 Proteinuria, unspecified: Secondary | ICD-10-CM | POA: Diagnosis not present

## 2024-01-31 DIAGNOSIS — D631 Anemia in chronic kidney disease: Secondary | ICD-10-CM | POA: Diagnosis not present

## 2024-01-31 DIAGNOSIS — E1122 Type 2 diabetes mellitus with diabetic chronic kidney disease: Secondary | ICD-10-CM | POA: Diagnosis not present

## 2024-01-31 DIAGNOSIS — N1832 Chronic kidney disease, stage 3b: Secondary | ICD-10-CM | POA: Diagnosis not present

## 2024-02-02 ENCOUNTER — Other Ambulatory Visit: Payer: Self-pay | Admitting: Physician Assistant

## 2024-02-08 ENCOUNTER — Telehealth: Payer: Self-pay | Admitting: Internal Medicine

## 2024-02-08 ENCOUNTER — Encounter: Payer: Self-pay | Admitting: Rheumatology

## 2024-02-08 ENCOUNTER — Ambulatory Visit: Payer: PPO | Attending: Rheumatology | Admitting: Rheumatology

## 2024-02-08 VITALS — BP 111/75 | HR 69 | Resp 15 | Ht 61.0 in | Wt 184.6 lb

## 2024-02-08 DIAGNOSIS — Z853 Personal history of malignant neoplasm of breast: Secondary | ICD-10-CM | POA: Diagnosis not present

## 2024-02-08 DIAGNOSIS — Z79899 Other long term (current) drug therapy: Secondary | ICD-10-CM | POA: Diagnosis not present

## 2024-02-08 DIAGNOSIS — Z96653 Presence of artificial knee joint, bilateral: Secondary | ICD-10-CM

## 2024-02-08 DIAGNOSIS — N1831 Chronic kidney disease, stage 3a: Secondary | ICD-10-CM

## 2024-02-08 DIAGNOSIS — M7062 Trochanteric bursitis, left hip: Secondary | ICD-10-CM

## 2024-02-08 DIAGNOSIS — K219 Gastro-esophageal reflux disease without esophagitis: Secondary | ICD-10-CM

## 2024-02-08 DIAGNOSIS — E1142 Type 2 diabetes mellitus with diabetic polyneuropathy: Secondary | ICD-10-CM | POA: Diagnosis not present

## 2024-02-08 DIAGNOSIS — Z8639 Personal history of other endocrine, nutritional and metabolic disease: Secondary | ICD-10-CM | POA: Diagnosis not present

## 2024-02-08 DIAGNOSIS — I5032 Chronic diastolic (congestive) heart failure: Secondary | ICD-10-CM

## 2024-02-08 DIAGNOSIS — I10 Essential (primary) hypertension: Secondary | ICD-10-CM | POA: Diagnosis not present

## 2024-02-08 DIAGNOSIS — M059 Rheumatoid arthritis with rheumatoid factor, unspecified: Secondary | ICD-10-CM

## 2024-02-08 DIAGNOSIS — M47816 Spondylosis without myelopathy or radiculopathy, lumbar region: Secondary | ICD-10-CM | POA: Diagnosis not present

## 2024-02-08 DIAGNOSIS — M4124 Other idiopathic scoliosis, thoracic region: Secondary | ICD-10-CM | POA: Diagnosis not present

## 2024-02-08 DIAGNOSIS — E113392 Type 2 diabetes mellitus with moderate nonproliferative diabetic retinopathy without macular edema, left eye: Secondary | ICD-10-CM | POA: Diagnosis not present

## 2024-02-08 DIAGNOSIS — H401132 Primary open-angle glaucoma, bilateral, moderate stage: Secondary | ICD-10-CM

## 2024-02-08 NOTE — Telephone Encounter (Signed)
 Patient came in to office today and brought a [employer] Patient Assistance Program Application to be completed.  The application is in Dr. Harvel Ricks folder in the front office.

## 2024-02-09 DIAGNOSIS — R490 Dysphonia: Secondary | ICD-10-CM | POA: Diagnosis not present

## 2024-02-09 DIAGNOSIS — H903 Sensorineural hearing loss, bilateral: Secondary | ICD-10-CM | POA: Diagnosis not present

## 2024-02-09 DIAGNOSIS — H6983 Other specified disorders of Eustachian tube, bilateral: Secondary | ICD-10-CM | POA: Diagnosis not present

## 2024-02-13 ENCOUNTER — Other Ambulatory Visit: Payer: Self-pay | Admitting: Cardiology

## 2024-02-15 DIAGNOSIS — E113393 Type 2 diabetes mellitus with moderate nonproliferative diabetic retinopathy without macular edema, bilateral: Secondary | ICD-10-CM | POA: Diagnosis not present

## 2024-02-15 DIAGNOSIS — H26492 Other secondary cataract, left eye: Secondary | ICD-10-CM | POA: Diagnosis not present

## 2024-02-15 DIAGNOSIS — H43813 Vitreous degeneration, bilateral: Secondary | ICD-10-CM | POA: Diagnosis not present

## 2024-02-15 DIAGNOSIS — H538 Other visual disturbances: Secondary | ICD-10-CM | POA: Diagnosis not present

## 2024-02-15 DIAGNOSIS — H35351 Cystoid macular degeneration, right eye: Secondary | ICD-10-CM | POA: Diagnosis not present

## 2024-02-15 LAB — HM DIABETES EYE EXAM

## 2024-02-16 ENCOUNTER — Other Ambulatory Visit: Payer: Self-pay | Admitting: Cardiology

## 2024-02-21 ENCOUNTER — Ambulatory Visit: Payer: PPO | Attending: Cardiology | Admitting: Cardiology

## 2024-02-21 ENCOUNTER — Encounter: Payer: Self-pay | Admitting: Cardiology

## 2024-02-21 VITALS — BP 121/77 | HR 74 | Wt 184.8 lb

## 2024-02-21 DIAGNOSIS — I5032 Chronic diastolic (congestive) heart failure: Secondary | ICD-10-CM

## 2024-02-21 DIAGNOSIS — N1832 Chronic kidney disease, stage 3b: Secondary | ICD-10-CM | POA: Diagnosis not present

## 2024-02-21 DIAGNOSIS — E118 Type 2 diabetes mellitus with unspecified complications: Secondary | ICD-10-CM

## 2024-02-21 DIAGNOSIS — I43 Cardiomyopathy in diseases classified elsewhere: Secondary | ICD-10-CM | POA: Diagnosis not present

## 2024-02-21 DIAGNOSIS — E854 Organ-limited amyloidosis: Secondary | ICD-10-CM

## 2024-02-21 DIAGNOSIS — G629 Polyneuropathy, unspecified: Secondary | ICD-10-CM

## 2024-02-21 NOTE — Progress Notes (Signed)
 [facility]ED [facility] FAIL[facility] CLINIC NOTE  Referring Physician: Esperanza Richters, PA-C  Primary Care: Esperanza Richters, PA-C  HPI: Donna Fuller is a 83 y.o. 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  Her cardiac history dates back to at least 2022 when she presented to [facility] with complaints of substernal chest pressure.  Patient underwent left heart catheterization that demonstrated only mild nonobstructive CAD of the LAD.  Echocardiogram with moderate to severe LVH and grade 2 diastolic dysfunction with LV ejection fraction of 60 to 65%.  Since that time patient reports she has had a continued decline in her functional status.  She was admitted to [facility] in July and December 2024 with acute on chronic HFpEF exacerbations.  In December 2024 she was diuresed with IV Lasix and discharged home on torsemide 10 mg daily.  She reports having bilateral carpal tunnel syndrome, peripheral neuropathy and low back pain.    Interval history - Reports that she is doing fairly well from a heart failure standpoint; her only complaints today are neuropathy in both hands.  - She is no longer using the motorized scooter at the grocery store; reports that she can walk around herself. She has completed physical therapy but continues to do their exercises.    Past Medical History:  Diagnosis Date   Arthritis    Breast cancer [facility]) 2007   Right breast, s/p mastectomy   Chronic diastolic congestive heart failure (HCC)    per patient, dx by cardiology   Diabetes mellitus without complication (HCC)    GERD (gastroesophageal reflux disease)    Gout    Hyperlipidemia    Hypertension    Spinal stenosis     Current Outpatient Medications  Medication Sig Dispense Refill   acetaminophen (TYLENOL) 650 MG CR tablet Take 650 mg by mouth 2 (two) times daily.     albuterol (VENTOLIN HFA) 108 (90 Base) MCG/ACT  inhaler INHALE 2 PUFFS BY MOUTH EVERY 6 HOURS AS NEEDED (Patient taking differently: Inhale 2 puffs into the lungs every 6 (six) hours as needed for wheezing or shortness of breath.) 8.5 each 2   Alcohol Swabs (ALCOHOL PREP PAD) 70 % PADS Use to check blood sugars (Patient taking differently: 1 each by Other route See admin instructions. Use to check blood sugars) 100 each 3   ascorbic acid (VITAMIN C) 1000 MG tablet Take 1,000 mg by mouth daily.     aspirin EC 81 MG tablet Take 1 tablet (81 mg total) by mouth daily. Swallow whole. 90 tablet 3   Benzocaine-Benzethonium (LANACANE EX) Apply 1 application  topically daily as needed. Roll-on daily as needed for knee pain.     benzonatate (TESSALON) 100 MG capsule Take 1 capsule (100 mg total) by mouth 3 (three) times daily as needed for cough. 30 capsule 0   blood glucose meter kit and supplies KIT Dispense based on patient and insurance preference. Use up to four times daily as directed. (FOR ICD-9 250.00, 250.01). (Patient taking differently: Inject 1 each into the skin as directed. Dispense based on patient and insurance preference. Use up to four times daily as directed. (FOR ICD-9 250.00, 250.01).) 1 each 11   brimonidine (ALPHAGAN) 0.2 % ophthalmic solution Place 1 drop into both eyes 3 (three) times daily.     bumetanide (BUMEX) 1 MG tablet Take 1 mg by mouth daily.     Cholecalciferol (VITAMIN D-3 PO) Take 2,000 Units  by mouth daily.     Cyanocobalamin (B-12 PO) Take 1 tablet by mouth daily.     Dulaglutide (TRULICITY) 1.5 MG/0.5ML SOAJ INJECT 1.5 MG (0.5ML) UNDER THE SKIN ONCE A WEEK 6 mL 3   empagliflozin (JARDIANCE) 10 MG TABS tablet Take 1 tablet (10 mg total) by mouth daily before breakfast. 30 tablet 5   ezetimibe (ZETIA) 10 MG tablet Take 1 tablet (10 mg total) by mouth daily. 90 tablet 3   fluticasone (FLONASE) 50 MCG/ACT nasal spray SPRAY 2 SPRAYS INTO EACH NOSTRIL EVERY DAY 48 mL 1   gabapentin (NEURONTIN) 300 MG capsule Take 1 capsule  (300 mg total) by mouth 3 (three) times daily. 270 capsule 1   glipiZIDE (GLUCOTROL) 5 MG tablet Take 1 tablet (5 mg total) by mouth 2 (two) times daily before a meal. 180 tablet 3   ketorolac (ACULAR) 0.5 % ophthalmic solution INSTILL 1 DROP INTO RIGHT EYE 4 TIMES A DAY (Patient taking differently: INSTILL 1 DROP INTO RIGHT EYE 3 TIMES A DAY) 5 mL 6   latanoprost (XALATAN) 0.005 % ophthalmic solution Place 1 drop into both eyes daily.     levocetirizine (XYZAL) 5 MG tablet TAKE 1 TABLET BY MOUTH EVERY DAY IN THE EVENING 90 tablet 1   losartan (COZAAR) 25 MG tablet Take 25 mg by mouth daily.     omeprazole (PRILOSEC) 40 MG capsule TAKE 1 CAP 2 TIMES DAILY. PLEASE CALL [phone] TO SCHEDULE AN OFFICE VISIT FOR MORE REFILLS. 180 capsule 0   OneTouch Delica Lancets 33G MISC Use as instructed to check blood sugar 2-3 times a day.  DX E11.9 (Patient taking differently: 1 each by Other route See admin instructions. Use as instructed to check blood sugar 2-3 times a day.  DX E11.9) 100 each 3   ONETOUCH ULTRA TEST test strip CHECK BLOOD SUGAR 2 TO 3 TIMES DAILY AS DIRECTED (Patient taking differently: 1 each by Other route as needed for other.) 100 strip 5   timolol (TIMOPTIC) 0.5 % ophthalmic solution Place 1 drop into both eyes 2 (two) times a day.     Vitamin A 2250 MCG (7500 UT) CAPS Take 1 capsule by mouth daily. You can find this over the counter. 30 capsule 11   vutrisiran sodium (AMVUTTRA) 25 MG/0.5ML syringe Inject 0.5 mLs (25 mg total) into the skin every 3 (three) months. 0.5 mL 11   No current facility-administered medications for this visit.    PHYSICAL EXAM: Vitals:   02/21/24 1407  BP: 121/77  Pulse: 74  SpO2: 97%   GENERAL: NAD Lungs- CTA CARDIAC:  JVP: 6 cm          Normal rate with regular rhythm. No murmur.  Pulses 2+. No edema.  ABDOMEN: Soft, non-tender, non-distended.  EXTREMITIES: Warm and well perfused.  NEUROLOGIC: No obvious FND   DATA REVIEW  ECG: 11/08/23:  Normal sinus rhythm and first-degree AV block as per my personal interpretation  ECHO: 11/07/23: LVEF 60 to 65% with severe LVH and grade 3 diastolic dysfunction as per my personal interpretation  CATH: [facility], 2022:    Mid LAD lesion is 20% stenosed.   LV end diastolic pressure is mildly elevated.   1.  Mild nonobstructive coronary artery disease. 2.  Left ventricular angiography was not performed.  EF was normal by echo. 3.  Mildly elevated left ventricular end-diastolic pressure at 18 mmHg.   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  -Now receiving Ammutra injections by pharmD.   2. HFpEF, restrictive heart disease -Secondary to cardiac amyloid - Will continue bumex; repeat BMP/BNP - Continue jardiance 10mg  daily - Plan to start finerenone at follow up. - Now off coreg; HR has improved to 70s  3. Peripheral neuropathy - Reports mild sensory deficits in her fingers with carpal tunnel like symptoms.  - Her A1C has ranged from 6 to 6.8 since 2022. - Now on amvuttra; continue gabapentin 300mg  TID  4. T2DM - A1C from 6.8 to 6 ranging from 2022 to 2024 - Continue trulicity - continue jardiance 10mg  daily  5. Hypertension  - decreased losartan to 25mg  daily; SBP at goal.   6. CKD -Rise in sCr to 2.33 at outside facility; reviewed labs from 01/26/24. repeat BMP/BNP today. Possibly due to over diuresis.   I spent 31 minutes caring for this patient today including face to face time, ordering and reviewing labs, reviewing labs noted above, discussing long term effects of Amvuttra, discussing with pharmD, seeing the patient, documenting in the record, and arranging follow  ups.    Nikoletta Varma Advanced Heart Failure Mechanical Circulatory Support

## 2024-02-21 NOTE — Patient Instructions (Addendum)
 Medication Changes:  No medication changes today!  Lab Work:  Go DOWN to LOWER LEVEL (LL) to have your blood work completed inside of [employer] office.   We will only call you if the results are abnormal or if the provider would like to make medication changes.   Follow-Up in: Please follow up with the [facility] in 3 months with Dr. Gasper Lloyd and 1 month with the Pharmacist.   At the [facility], you and your health needs are our priority. We have a designated team specialized in the treatment of Heart Failure. This Care Team includes your primary Heart Failure Specialized Cardiologist (physician), Advanced Practice Providers (APPs- Physician Assistants and Nurse Practitioners), and Pharmacist who all work together to provide you with the care you need, when you need it.   You may see any of the following providers on your designated Care Team at your next follow up:  Dr. Arvilla Meres Dr. Marca Ancona Dr. Dorthula Nettles Dr. Theresia Bough Clarisa Kindred, FNP Enos Fling, RPH-CPP  Please be sure to bring in all your medications bottles to every appointment.   Need to Contact us:  If you have any questions or concerns before your next appointment please send [place] a message through [place] or call our office at [phone].    TO LEAVE A MESSAGE FOR THE NURSE SELECT OPTION 2, PLEASE LEAVE A MESSAGE INCLUDING: YOUR NAME DATE OF BIRTH CALL BACK NUMBER REASON FOR CALL**this is important as we prioritize the call backs  YOU WILL RECEIVE A CALL BACK THE SAME DAY AS LONG AS YOU CALL BEFORE 4:00 PM

## 2024-02-22 LAB — BASIC METABOLIC PANEL
BUN/Creatinine Ratio: 20 (ref 12–28)
BUN: 36 mg/dL — ABNORMAL HIGH (ref 8–27)
CO2: 28 mmol/L (ref 20–29)
Calcium: 9.9 mg/dL (ref 8.7–10.3)
Chloride: 96 mmol/L (ref 96–106)
Creatinine, Ser: 1.77 mg/dL — ABNORMAL HIGH (ref 0.57–1.00)
Glucose: 176 mg/dL — ABNORMAL HIGH (ref 70–99)
Potassium: 4.5 mmol/L (ref 3.5–5.2)
Sodium: 140 mmol/L (ref 134–144)
eGFR: 28 mL/min/{1.73_m2} — ABNORMAL LOW (ref >59)

## 2024-02-22 LAB — BRAIN NATRIURETIC PEPTIDE: BNP: 291.1 pg/mL — ABNORMAL HIGH (ref 0.0–100.0)

## 2024-02-25 DIAGNOSIS — H401113 Primary open-angle glaucoma, right eye, severe stage: Secondary | ICD-10-CM | POA: Diagnosis not present

## 2024-02-25 DIAGNOSIS — H401122 Primary open-angle glaucoma, left eye, moderate stage: Secondary | ICD-10-CM | POA: Diagnosis not present

## 2024-02-29 ENCOUNTER — Other Ambulatory Visit: Payer: Self-pay

## 2024-03-09 DIAGNOSIS — N3592 Unspecified urethral stricture, female: Secondary | ICD-10-CM | POA: Diagnosis not present

## 2024-03-15 ENCOUNTER — Other Ambulatory Visit: Payer: Self-pay

## 2024-03-16 ENCOUNTER — Other Ambulatory Visit: Payer: Self-pay

## 2024-03-16 ENCOUNTER — Other Ambulatory Visit (HOSPITAL_COMMUNITY): Payer: Self-pay

## 2024-03-16 NOTE — Progress Notes (Signed)
 Specialty Pharmacy Refill Coordination Note  Donna Fuller is a 83 y.o. female assessed today regarding refills of clinic administered specialty medication(s) Vutrisiran Sodium (Amvuttra)   Clinic requested Courier to Provider Office   Delivery date: 03/21/24   Verified address: [facility] [address] [place] [address]   Medication will be filled on 03/20/24.

## 2024-03-20 ENCOUNTER — Other Ambulatory Visit: Payer: Self-pay

## 2024-03-20 ENCOUNTER — Other Ambulatory Visit (HOSPITAL_COMMUNITY): Payer: Self-pay

## 2024-03-20 ENCOUNTER — Telehealth: Payer: Self-pay | Admitting: Pharmacist

## 2024-03-20 MED ORDER — AMVUTTRA 25 MG/0.5ML ~~LOC~~ SOSY
25.0000 mg | PREFILLED_SYRINGE | SUBCUTANEOUS | 3 refills | Status: AC
  Filled 2024-03-20: qty 0.5, 90d supply, fill #0
  Filled 2024-06-08: qty 0.5, 90d supply, fill #1
  Filled 2024-09-12 (×2): qty 0.5, 90d supply, fill #2
  Filled 2024-12-13 – 2024-12-15 (×3): qty 0.5, 90d supply, fill #3

## 2024-03-20 NOTE — Telephone Encounter (Signed)
 Pharmacy request prescription be sent under Dr. Alease Amend.

## 2024-03-20 NOTE — Progress Notes (Signed)
 Advanced Heart Failure Clinic Note  PCP: Sylvia Everts, PA-C PCP-Cardiologist: Belva Boyden, MD HF-Cardiologist: Alwin Baars, DO  HPI:  Donna Fuller is a 83 y.o. 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   Her cardiac history dates back to at least 2022 when she presented to [facility] with complaints of substernal chest pressure.  Patient underwent left heart catheterization that demonstrated only mild nonobstructive CAD of the LAD.  Echocardiogram with moderate to severe LVH and grade 2 diastolic dysfunction with LV ejection fraction of 60 to 65%.  Since that time patient reports she has had a continued decline in her functional status.  She was admitted to [facility] in July and December 2024 with acute on chronic HFpEF exacerbations.  In December 2024 she was diuresed with IV Lasix  and discharged home on torsemide  10 mg daily.  She reports having bilateral carpal tunnel syndrome, peripheral neuropathy and low back pain.     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    Seen by Dr. Bruce Caper on 12/15/23. Patient was doing much better after increasing diuretics, able to ambulate and complete ADLs easier. Carvedilol  was decreased to 3.125 mg BID  Today Donna Fuller returns to Heart Failure Clinic for pharmacist medication titration. Reports feeling well and significantly improved from months prior. Reports improvments to shortness of breath, fatigue, orthopnea, and edema, being able to complete all activities of daily living (ADLs). Is somewhat active throughout the day. Weight at home is ~180 pounds. Takes bumetanide  1 mg daily. Appetite is improved. Somewhat follows a low sodium diet.  Current Heart Failure Medications: Loop diuretic: bumetanide  1 mg daily Beta-Blocker: carvedilol  3.125 mg twice daily ACEI/ARB/ARNI: losartan  50 mg daily MRA:  none SGLT2i: Jardiance  10 mg daily  Has the patient been experiencing any side effects to the medications prescribed? No  Does the patient have any problems obtaining medications due to transportation or finances? No  Understanding of regimen: Fair  Understanding of indications: Fair  Potential of adherence: Fair  Patient understands to avoid NSAIDs.  Patient understands to avoid decongestants.  Pertinent Lab Values: Creat  Date Value Ref Range Status  06/18/2023 1.90 (H) 0.60 - 0.95 mg/dL Final   Creatinine, Ser  Date Value Ref Range Status  02/21/2024 1.77 (H) 0.57 - 1.00 mg/dL Final   BUN  Date Value Ref Range Status  02/21/2024 36 (H) 8 - 27 mg/dL Final   Potassium  Date Value Ref Range Status  02/21/2024 4.5 3.5 - 5.2 mmol/L Final   Sodium  Date Value Ref Range Status  02/21/2024 140 134 - 144 mmol/L Final   Brain Natriuretic Peptide  Date Value Ref Range Status  06/18/2023 364 (H) <100 pg/mL Final    Comment:    . BNP levels increase with age in the general population with the highest values seen in individuals greater than 81 years of age. Reference: J. Am. Rosetta Cons. Cardiol. 2002; 16:109-604. .    B Natriuretic Peptide  Date Value Ref Range Status  11/16/2023 652.8 (H) 0.0 - 100.0 pg/mL Final    Comment:    Performed at [facility], [address] [place], [place] [address]   BNP  Date Value Ref Range Status  02/21/2024 291.1 (H) 0.0 - 100.0 pg/mL Final    Comment:    [employer] ADVIA Centaur XP methodology   Magnesium  Date Value Ref Range Status  11/21/2021 2.2 1.7 -  2.4 mg/dL Final    Comment:    Performed at [facility], [address] [place], [place] [address]   TSH  Date Value Ref Range Status  06/18/2023 1.69 0.40 - 4.50 mIU/L Final    Vital Signs: There were no vitals filed for this visit.  Assessment/Plan: Evauation for cardiac amyloid  - TTE with restrictive features; severe LVH and myocardium that  appears to be secondary to possible cardiac amyloidosis.  -Multiple myeloma panel negative -PYP scan scheduled for early February 2025 -Genetic panel positive for cardiac amyloid, ATTR  -Volume status has improved significantly.  Continue Bumex  1 mg twice daily  -Considering tafamidis if PYP scan is positive -Started on Amvuttra  today for polyneuropathy. Educated and provided with handout.    2. HFpEF, restrictive heart disease -Secondary to cardiac amyloid - Will continue Bumex  1 mg daily. Did not get labs last visit as intended by Dr. Bruce Caper. Will place orders today. - Plan to start finerenone at follow up. Prior authorization completed and approved. UACR 116 on 08/18/23 - Coreg  reduced to 3.125 mg twice daily last visit given limited benefit in HFpEF   3. Peripheral neuropathy - Reports mild sensory deficits in her fingers with carpal tunnel like symptoms.  - Her A1C has ranged from 6 to 6.8 since 2022. - Her polyneuropathy is secondary to cardiac amyloid.  First injection of Amvuttra  administered in clinic today [facility] [id number], LOT [id number], Exp Jan 2027). Tolerated the injection well. Next injection in 3 months.   4. T2DM - A1C from 6.8 to 6 ranging from 2022 to 2024 - Continue Trulicity  - Continue Jardiance  10 mg daily. Refill sent in.    5. Hypertension  - Continue losartan  50 mg daily   6. CKD -Repeat labs today.  Follow up: With Pharmacy in 3 months for next injection. With Dr. Bruce Caper in February.  Please do not hesitate to reach out with questions or concerns,  Bevely Brush, PharmD, CPP, BCPS Heart Failure Pharmacist  Phone - [phone] 03/20/2024 1:07 PM

## 2024-03-22 ENCOUNTER — Telehealth: Payer: Self-pay | Admitting: Pharmacist

## 2024-03-22 NOTE — Telephone Encounter (Signed)
 Called to confirm/remind patient of their appointment at the Advanced Heart Failure Clinic on 03/23/24.   Appointment:   [] Confirmed  [x] Left mess   [] No answer/No voice mail  [] VM Full/unable to leave message  [] Phone not in service  Patient reminded to bring all medications and/or complete list.  Confirmed patient has transportation. Gave directions, instructed to utilize valet parking.

## 2024-03-23 ENCOUNTER — Ambulatory Visit: Attending: Family | Admitting: Pharmacist

## 2024-03-23 ENCOUNTER — Other Ambulatory Visit (HOSPITAL_COMMUNITY): Payer: Self-pay

## 2024-03-23 ENCOUNTER — Telehealth: Payer: Self-pay

## 2024-03-23 VITALS — BP 120/62 | HR 74 | Wt 184.6 lb

## 2024-03-23 DIAGNOSIS — I5032 Chronic diastolic (congestive) heart failure: Secondary | ICD-10-CM | POA: Diagnosis not present

## 2024-03-23 DIAGNOSIS — N189 Chronic kidney disease, unspecified: Secondary | ICD-10-CM | POA: Diagnosis not present

## 2024-03-23 DIAGNOSIS — I43 Cardiomyopathy in diseases classified elsewhere: Secondary | ICD-10-CM | POA: Insufficient documentation

## 2024-03-23 DIAGNOSIS — I130 Hypertensive heart and chronic kidney disease with heart failure and stage 1 through stage 4 chronic kidney disease, or unspecified chronic kidney disease: Secondary | ICD-10-CM | POA: Diagnosis not present

## 2024-03-23 DIAGNOSIS — Z7985 Long-term (current) use of injectable non-insulin antidiabetic drugs: Secondary | ICD-10-CM | POA: Insufficient documentation

## 2024-03-23 DIAGNOSIS — E1122 Type 2 diabetes mellitus with diabetic chronic kidney disease: Secondary | ICD-10-CM | POA: Insufficient documentation

## 2024-03-23 DIAGNOSIS — E1140 Type 2 diabetes mellitus with diabetic neuropathy, unspecified: Secondary | ICD-10-CM | POA: Diagnosis not present

## 2024-03-23 DIAGNOSIS — I2510 Atherosclerotic heart disease of native coronary artery without angina pectoris: Secondary | ICD-10-CM | POA: Insufficient documentation

## 2024-03-23 DIAGNOSIS — E854 Organ-limited amyloidosis: Secondary | ICD-10-CM | POA: Insufficient documentation

## 2024-03-23 DIAGNOSIS — E785 Hyperlipidemia, unspecified: Secondary | ICD-10-CM | POA: Diagnosis not present

## 2024-03-23 MED ORDER — VUTRISIRAN SODIUM 25 MG/0.5ML ~~LOC~~ SOSY
25.0000 mg | PREFILLED_SYRINGE | Freq: Once | SUBCUTANEOUS | Status: AC
Start: 2024-03-23 — End: 2024-03-23
  Administered 2024-03-23: 25 mg via SUBCUTANEOUS

## 2024-03-23 NOTE — Telephone Encounter (Signed)
 Advanced Heart Failure Patient Advocate Encounter  Test billing for Ethelene Herald shows $0 copay for 90 day supply. No medication assistance needed for this medication at this time.  Kennis Peacock, CPhT Rx Patient Advocate Phone: [phone]

## 2024-03-23 NOTE — Patient Instructions (Signed)
 It was a pleasure seeing you today!  MEDICATIONS: -No changes today. -Call if you have questions about your medications.  LABS: -We will call you if your labs need attention.  NEXT APPOINTMENT: Return to clinic 3 months to see Djibril Glogowski.  In general, to take care of your heart failure: -Limit your fluid intake to 2 Liters (half-gallon) per day.   -Limit your salt intake to ideally 2-3 grams (2000-3000 mg) per day. -Weigh yourself daily and record, and bring that "weight diary" to your next appointment.  (Weight gain of 2-3 pounds in 1 day typically means fluid weight.) -The medications for your heart are to help your heart and help you live longer.   -Please contact us  before stopping any of your heart medications.  Call the clinic at [phone] with questions or to reschedule future appointments.

## 2024-03-24 ENCOUNTER — Other Ambulatory Visit: Payer: Self-pay | Admitting: Medical

## 2024-03-29 ENCOUNTER — Ambulatory Visit
Admission: RE | Admit: 2024-03-29 | Discharge: 2024-03-29 | Disposition: A | Discharge status: Home / Self Care | Source: Ambulatory Visit | Attending: Medical | Admitting: Medical

## 2024-03-29 DIAGNOSIS — N6099 Unspecified benign mammary dysplasia of unspecified breast: Secondary | ICD-10-CM

## 2024-03-29 DIAGNOSIS — R921 Mammographic calcification found on diagnostic imaging of breast: Secondary | ICD-10-CM | POA: Diagnosis not present

## 2024-03-30 ENCOUNTER — Other Ambulatory Visit (HOSPITAL_COMMUNITY): Payer: Self-pay

## 2024-04-08 ENCOUNTER — Other Ambulatory Visit: Payer: Self-pay | Admitting: Physician Assistant

## 2024-04-11 ENCOUNTER — Other Ambulatory Visit: Payer: Self-pay

## 2024-04-11 ENCOUNTER — Ambulatory Visit (INDEPENDENT_AMBULATORY_CARE_PROVIDER_SITE_OTHER)

## 2024-04-11 VITALS — Ht 61.0 in | Wt 184.0 lb

## 2024-04-11 DIAGNOSIS — Z0000 Encounter for general adult medical examination without abnormal findings: Secondary | ICD-10-CM | POA: Diagnosis not present

## 2024-04-11 NOTE — Telephone Encounter (Signed)
 Patient seen for annual wellness visit and is requesting a refill of Omeprazole .

## 2024-04-11 NOTE — Progress Notes (Signed)
 Subjective:   Donna Fuller is a 83 y.o. who presents for a Medicare Wellness preventive visit.  As a reminder, Annual Wellness Visits don't include a physical exam, and some assessments may be limited, especially if this visit is performed virtually. We may recommend an in-person visit if needed.  Visit Complete: Virtual I connected with  Donna Fuller on 04/11/24 by a audio enabled telemedicine application and verified that I am speaking with the correct person using two identifiers.  Patient Location: Home  Provider Location: Home Office  I discussed the limitations of evaluation and management by telemedicine. The patient expressed understanding and agreed to proceed.  Vital Signs: Because this visit was a virtual/telehealth visit, some criteria may be missing or patient reported. Any vitals not documented were not able to be obtained and vitals that have been documented are patient reported.  VideoDeclined- This patient declined Librarian, academic. Therefore the visit was completed with audio only.  Persons Participating in Visit: Patient.  AWV Questionnaire: No: Patient Medicare AWV questionnaire was not completed prior to this visit.  Cardiac Risk Factors include: advanced age (>61men, >23 women);diabetes mellitus;dyslipidemia;hypertension     Objective:     Today's Vitals   04/11/24 1359  Weight: 184 lb (83.5 kg)  Height: 5\' 1"  (1.549 m)   Body mass index is 34.77 kg/m.     04/11/2024    2:14 PM 12/09/2023    1:44 PM 11/05/2023   11:30 PM 08/17/2023    1:21 PM 06/11/2023    2:52 PM 05/31/2023    5:38 PM 10/06/2022    1:19 PM  Advanced Directives  Does Patient Have a Medical Advance Directive? No No No No No No Yes  Type of Advance Directive       Living will;Healthcare Power of Attorney  Does patient want to make changes to medical advance directive?       No - Patient declined  Copy of Healthcare Power of Attorney in Chart?        No - copy requested  Would patient like information on creating a medical advance directive? Yes (MAU/Ambulatory/Procedural Areas - Information given)  No - Patient declined No - Patient declined No - Patient declined      Current Medications (verified) Outpatient Encounter Medications as of 04/11/2024  Medication Sig   acetaminophen  (TYLENOL ) 650 MG CR tablet Take 650 mg by mouth 2 (two) times daily.   albuterol  (VENTOLIN  HFA) 108 (90 Base) MCG/ACT inhaler INHALE 2 PUFFS BY MOUTH EVERY 6 HOURS AS NEEDED (Patient taking differently: Inhale 2 puffs into the lungs every 6 (six) hours as needed for wheezing or shortness of breath.)   Alcohol  Swabs (ALCOHOL  PREP PAD) 70 % PADS Use to check blood sugars (Patient taking differently: 1 each by Other route See admin instructions. Use to check blood sugars)   ascorbic acid (VITAMIN C) 1000 MG tablet Take 1,000 mg by mouth daily.   aspirin  EC 81 MG tablet Take 1 tablet (81 mg total) by mouth daily. Swallow whole.   Benzocaine-Benzethonium (LANACANE EX) Apply 1 application  topically daily as needed. Roll-on daily as needed for knee pain.   blood glucose meter kit and supplies KIT Dispense based on patient and insurance preference. Use up to four times daily as directed. (FOR ICD-9 250.00, 250.01). (Patient taking differently: Inject 1 each into the skin as directed. Dispense based on patient and insurance preference. Use up to four times daily as directed. (FOR ICD-9 250.00,  250.01).)   brimonidine  (ALPHAGAN ) 0.2 % ophthalmic solution Place 1 drop into both eyes 3 (three) times daily.   bumetanide  (BUMEX ) 1 MG tablet Take 2 mg by mouth daily.   Cholecalciferol (VITAMIN D-3 PO) Take 2,000 Units by mouth daily.   Cyanocobalamin  (B-12 PO) Take 1 tablet by mouth daily.   Dulaglutide  (TRULICITY ) 1.5 MG/0.5ML SOAJ INJECT 1.5 MG (0.5ML) UNDER THE SKIN ONCE A WEEK   empagliflozin  (JARDIANCE ) 10 MG TABS tablet Take 1 tablet (10 mg total) by mouth daily before  breakfast.   ezetimibe  (ZETIA ) 10 MG tablet Take 1 tablet (10 mg total) by mouth daily.   fluticasone  (FLONASE ) 50 MCG/ACT nasal spray SPRAY 2 SPRAYS INTO EACH NOSTRIL EVERY DAY   gabapentin  (NEURONTIN ) 300 MG capsule TAKE 1 CAPSULE BY MOUTH THREE TIMES A DAY   glipiZIDE  (GLUCOTROL ) 5 MG tablet Take 1 tablet (5 mg total) by mouth 2 (two) times daily before a meal.   ketorolac  (ACULAR ) 0.5 % ophthalmic solution INSTILL 1 DROP INTO RIGHT EYE 4 TIMES A DAY (Patient taking differently: INSTILL 1 DROP INTO RIGHT EYE 3 TIMES A DAY)   levocetirizine (XYZAL ) 5 MG tablet TAKE 1 TABLET BY MOUTH EVERY DAY IN THE EVENING   losartan  (COZAAR ) 25 MG tablet Take 25 mg by mouth daily.   omeprazole  (PRILOSEC) 40 MG capsule TAKE 1 CAP 2 TIMES DAILY. PLEASE CALL [phone] TO SCHEDULE AN OFFICE VISIT FOR MORE REFILLS.   OneTouch Delica Lancets 33G MISC Use as instructed to check blood sugar 2-3 times a day.  DX E11.9 (Patient taking differently: 1 each by Other route See admin instructions. Use as instructed to check blood sugar 2-3 times a day.  DX E11.9)   ONETOUCH ULTRA TEST test strip CHECK BLOOD SUGAR 2 TO 3 TIMES DAILY AS DIRECTED (Patient taking differently: 1 each by Other route as needed for other.)   timolol  (TIMOPTIC ) 0.5 % ophthalmic solution Place 1 drop into both eyes 2 (two) times a day.   Vitamin A  2250 MCG (7500 UT) CAPS Take 1 capsule by mouth daily. You can find this over the counter.   vutrisiran  sodium (AMVUTTRA ) 25 MG/0.5ML syringe Inject 0.5 mLs (25 mg total) into the skin every 3 (three) months.   latanoprost  (XALATAN ) 0.005 % ophthalmic solution Place 1 drop into both eyes daily. (Patient not taking: Reported on 03/23/2024)   No facility-administered encounter medications on file as of 04/11/2024.    Allergies (verified) Penicillins, Sulfa antibiotics, Amlodipine, and Lisinopril   History: Past Medical History:  Diagnosis Date   Arthritis    Breast cancer (HCC) 2007   Right breast,  s/p mastectomy   Chronic diastolic congestive heart failure (HCC)    per patient, dx by cardiology   Diabetes mellitus without complication (HCC)    GERD (gastroesophageal reflux disease)    Gout    Hyperlipidemia    Hypertension    Spinal stenosis    Past Surgical History:  Procedure Laterality Date   ABDOMINAL HYSTERECTOMY     ABDOMINAL SURGERY     BREAST BIOPSY Left    2016 benign    BREAST BIOPSY Left 02/16/2023   MM LT BREAST BX W LOC DEV 1ST LESION IMAGE BX SPEC STEREO GUIDE 02/16/2023 [facility] MAMMOGRAPHY   LEFT HEART CATH AND CORONARY ANGIOGRAPHY N/A 11/21/2021   Procedure: LEFT HEART CATH AND CORONARY ANGIOGRAPHY;  Surgeon: Wenona Hamilton, MD;  Location: [facility] INVASIVE CV LAB;  Service: Cardiovascular;  Laterality: N/A;   MASTECTOMY  right side   REPLACEMENT TOTAL KNEE Bilateral    TUBAL LIGATION     Family History  Problem Relation Age of Onset   Diabetes Mother    Alzheimer's disease Mother    Hypertension Mother    Hypertension Father    Heart disease Father    Hyperlipidemia Father    Kidney disease Father    Diabetes Sister    Cancer Brother        lung   Alzheimer's disease Brother    Heart disease Brother    Hypertension Brother    Diabetes Brother    Hypertension Brother    Diabetes Brother    Hyperlipidemia Son    Hypertension Son    Diabetes Son    Hypertension Daughter    Hyperlipidemia Daughter    Social History   Socioeconomic History   Marital status: Married    Spouse name: Jimmy   Number of children: 3   Years of education: 12   Highest education level: Not on file  Occupational History   Occupation: Retired  Tobacco Use   Smoking status: Former    Types: Cigarettes    Passive exposure: Never   Smokeless tobacco: Never  Vaping Use   Vaping status: Never Used  Substance and Sexual Activity   Alcohol  use: No   Drug use: No   Sexual activity: Not Currently  Other Topics Concern   Not on file  Social History Narrative    Marital Status:  Married Hydrologist)    Children:  Daughter(1) Son (1)    Pets:  None    Living Situation: Lives with husband and daughter.     Occupation:  Retired Contractor)    Education: 12th Grade    Tobacco Use/Exposure:  She used to smoke socially during the weekends but quit 40 years ago.    Alcohol  Use:  Occasional   Drug Use:  None   Diet:  Regular   Exercise:  None   Hobbies:  Reading and playing volleyball             Social Drivers of Health   Financial Resource Strain: Low Risk  (04/11/2024)   Overall Financial Resource Strain (CARDIA)    Difficulty of Paying Living Expenses: Not hard at all  Food Insecurity: No Food Insecurity (04/11/2024)   Hunger Vital Sign    Worried About Running Out of Food in the Last Year: Never true    Ran Out of Food in the Last Year: Never true  Transportation Needs: No Transportation Needs (04/11/2024)   PRAPARE - Administrator, Civil Service (Medical): No    Lack of Transportation (Non-Medical): No  Physical Activity: Inactive (04/11/2024)   Exercise Vital Sign    Days of Exercise per Week: 0 days    Minutes of Exercise per Session: 0 min  Stress: No Stress Concern Present (04/11/2024)   [employer] of Occupational Health - Occupational Stress Questionnaire    Feeling of Stress : Not at all  Social Connections: Moderately Integrated (04/11/2024)   Social Connection and Isolation Panel [NHANES]    Frequency of Communication with Friends and Family: More than three times a week    Frequency of Social Gatherings with Friends and Family: More than three times a week    Attends Religious Services: More than 4 times per year    Active Member of [employer] or Organizations: No    Attends Banker Meetings: Never    Marital Status: Married  Tobacco Counseling Counseling given: Not Answered    Clinical Intake:  Pre-visit preparation completed: Yes  Pain : No/denies pain     Diabetes: Yes CBG done?: No Did pt.  bring in CBG monitor from home?: No  Lab Results  Component Value Date   HGBA1C 6.8 (A) 10/21/2023   HGBA1C 6.7 (A) 04/20/2023   HGBA1C 6.8 (A) 10/19/2022     How often do you need to have someone help you when you read instructions, pamphlets, or other written materials from your doctor or pharmacy?: 1 - Never  Interpreter Needed?: No  Information entered by :: Seabron Cypress LPN   Activities of Daily Living     04/11/2024    2:14 PM 11/06/2023    3:54 PM  In your present state of health, do you have any difficulty performing the following activities:  Hearing? 0 0  Vision? 0 0  Difficulty concentrating or making decisions? 0 0  Walking or climbing stairs? 1   Dressing or bathing? 0   Doing errands, shopping? 0 0  Preparing Food and eating ? N   Using the Toilet? N   In the past six months, have you accidently leaked urine? N   Do you have problems with loss of bowel control? N   Managing your Medications? N   Managing your Finances? N   Housekeeping or managing your Housekeeping? N     Patient Care Team: Saguier, Edward, PA-C as PCP - General (Internal Medicine) Devorah Fonder, MD as PCP - Cardiology (Cardiology) Nicholas Bari, MD as Consulting Physician (Rheumatology) Margaree Shark, MD as Consulting Physician (Family Medicine) Agrawal, Kavita, MD as Consulting Physician (Oncology) Aminta Kales, MD as Consulting Physician (Ophthalmology)  Indicate any recent Medical Services you may have received from other than [facility] providers in the past year (date may be approximate).     Assessment:    This is a routine wellness examination for Hollins.  Hearing/Vision screen Hearing Screening - Comments:: Denies hearing difficulties   Vision Screening - Comments:: Wears rx glasses - up to date with routine eye exams with Dr. Allison Ivory    Goals Addressed             This Visit's Progress    Patient Stated   On track    Maintain current health &  independence       Depression Screen     04/11/2024    2:13 PM 06/18/2023    2:02 PM 10/06/2022    1:22 PM 10/01/2021    2:30 PM 06/27/2021    1:58 PM 03/28/2020    1:13 PM 12/29/2016    1:34 PM  PHQ 2/9 Scores  PHQ - 2 Score 0 0 1 0 0 1 1    Fall Risk     04/11/2024    2:13 PM 06/18/2023    2:02 PM 04/05/2023    3:57 PM 10/06/2022    1:21 PM 10/01/2021    2:28 PM  Fall Risk   Falls in the past year? 0 0 1 1 1   Number falls in past yr: 0 0 1 1 0  Injury with Fall? 0 0 1 0 1  Risk for fall due to : Impaired balance/gait;Impaired mobility  History of fall(s);Impaired mobility History of fall(s) History of fall(s)  Follow up Falls prevention discussed;Education provided;Falls evaluation completed Falls evaluation completed Falls evaluation completed Falls evaluation completed Falls prevention discussed    MEDICARE RISK AT HOME:  Medicare Risk at Home  Any stairs in or around the home?: No If so, are there any without handrails?: No Home free of loose throw rugs in walkways, pet beds, electrical cords, etc?: Yes Adequate lighting in your home to reduce risk of falls?: Yes Life alert?: No Use of a cane, walker or w/c?: Yes Grab bars in the bathroom?: Yes Shower chair or bench in shower?: No Elevated toilet seat or a handicapped toilet?: Yes  TIMED UP AND GO:  Was the test performed?  No  Cognitive Function: 6CIT completed    12/29/2016    1:34 PM  MMSE - Mini Mental State Exam  Orientation to time 5  Orientation to Place 5  Registration 3  Attention/ Calculation 5  Recall 3  Language- name 2 objects 2  Language- repeat 1  Language- follow 3 step command 3  Language- read & follow direction 1  Write a sentence 1  Copy design 0  Total score 29        04/11/2024    2:14 PM 10/06/2022    1:30 PM  6CIT Screen  What Year? 0 points 0 points  What month? 0 points 0 points  What time? 0 points 0 points  Count back from 20 0 points 2 points  Months in reverse 2 points 0  points  Repeat phrase 2 points 4 points  Total Score 4 points 6 points    Immunizations Immunization History  Administered Date(s) Administered   Fluad Quad(high Dose 65+) 08/11/2022   Fluzone Influenza virus vaccine,trivalent (IIV3), split virus 09/16/2010, 10/05/2011   Influenza, High Dose Seasonal PF 09/10/2015, 08/31/2018, 08/23/2023   Influenza,inj,Quad PF,6+ Mos 08/30/2016   Influenza,inj,quad, With Preservative 09/11/2014   Influenza-Unspecified 08/18/2021, 08/15/2022   Moderna Covid-19 Vaccine Bivalent Booster 52yrs & up 08/29/2021   Moderna Sars-Covid-2 Vaccination 12/25/2019, 01/11/2020, 02/05/2020   Pneumococcal Conjugate-13 05/22/2015   Pneumococcal Polysaccharide-23 10/06/2021   Td 04/05/2023    Screening Tests Health Maintenance  Topic Date Due   Zoster Vaccines- Shingrix (1 of 2) Never done   MAMMOGRAM  01/20/2024   HEMOGLOBIN A1C  04/19/2024   INFLUENZA VACCINE  06/30/2024   Diabetic kidney evaluation - Urine ACR  08/17/2024   FOOT EXAM  10/20/2024   OPHTHALMOLOGY EXAM  02/14/2025   Diabetic kidney evaluation - eGFR measurement  02/20/2025   Medicare Annual Wellness (AWV)  04/11/2025   DTaP/Tdap/Td (2 - Tdap) 04/04/2033   Pneumonia Vaccine 99+ Years old  Completed   DEXA SCAN  Completed   HPV VACCINES  Aged Out   Meningococcal B Vaccine  Aged Out   COVID-19 Vaccine  Discontinued   Hepatitis C Screening  Discontinued    Health Maintenance  Health Maintenance Due  Topic Date Due   Zoster Vaccines- Shingrix (1 of 2) Never done   MAMMOGRAM  01/20/2024    Additional Screening:  Vision Screening: Recommended annual ophthalmology exams for early detection of glaucoma and other disorders of the eye.  Dental Screening: Recommended annual dental exams for proper oral hygiene  Community Resource Referral / Chronic Care Management: CRR required this visit?  No   CCM required this visit?  No   Plan:    I have personally reviewed and noted the  following in the patient's chart:   Medical and social history Use of alcohol , tobacco or illicit drugs  Current medications and supplements including opioid prescriptions. Patient is not currently taking opioid prescriptions. Functional ability and status Nutritional status Physical activity Advanced directives List of other physicians Hospitalizations,  surgeries, and ER visits in previous 12 months Vitals Screenings to include cognitive, depression, and falls Referrals and appointments  In addition, I have reviewed and discussed with patient certain preventive protocols, quality metrics, and best practice recommendations. A written personalized care plan for preventive services as well as general preventive health recommendations were provided to patient.   Seabron Cypress [place], [place]   1/76/1607   After Visit Summary: (MyChart) Due to this being a telephonic visit, the after visit summary with patients personalized plan was offered to patient via MyChart   Notes: Nothing significant to report at this time.

## 2024-04-11 NOTE — Patient Instructions (Signed)
 Donna Fuller , Thank you for taking time out of your busy schedule to complete your Annual Wellness Visit with me. I enjoyed our conversation and look forward to speaking with you again next year. I, as well as your care team,  appreciate your ongoing commitment to your health goals. Please review the following plan we discussed and let me know if I can assist you in the future. Your Game plan/ To Do List    Follow up Visits: Next Medicare AWV with our clinical staff: In 1 year   Have you seen your provider in the last 6 months (3 months if uncontrolled diabetes)? Yes Next Office Visit with your provider: To be scheduled   Clinician Recommendations:  Aim for 30 minutes of exercise or brisk walking, 6-8 glasses of water, and 5 servings of fruits and vegetables each day.       This is a list of the screening recommended for you and due dates:  Health Maintenance  Topic Date Due   Zoster (Shingles) Vaccine (1 of 2) Never done   Mammogram  01/20/2024   Hemoglobin A1C  04/19/2024   Flu Shot  06/30/2024   Yearly kidney health urinalysis for diabetes  08/17/2024   Complete foot exam   10/20/2024   Eye exam for diabetics  02/14/2025   Yearly kidney function blood test for diabetes  02/20/2025   Medicare Annual Wellness Visit  04/11/2025   DTaP/Tdap/Td vaccine (2 - Tdap) 04/04/2033   Pneumonia Vaccine  Completed   DEXA scan (bone density measurement)  Completed   HPV Vaccine  Aged Out   Meningitis B Vaccine  Aged Out   COVID-19 Vaccine  Discontinued   Hepatitis C Screening  Discontinued    Advanced directives: (ACP Link)Information on Advanced Care Planning can be found at [place]  Secretary of [facility] Advance Health Care Directives Advance Health Care Directives. [email or web address]   Advance Care Planning is important because it:  [x]  Makes sure you receive the medical care that is consistent with your values, goals, and preferences  [x]  It provides guidance to your family and loved ones and  reduces their decisional burden about whether or not they are making the right decisions based on your wishes.  Follow the link provided in your after visit summary or read over the paperwork we have mailed to you to help you started getting your Advance Directives in place. If you need assistance in completing these, please reach out to us  so that we can help you!  See attachments for Preventive Care and Fall Prevention Tips.

## 2024-04-12 ENCOUNTER — Other Ambulatory Visit: Payer: Self-pay | Admitting: Medical

## 2024-04-12 MED ORDER — OMEPRAZOLE 40 MG PO CPDR: 40.0000 mg | DELAYED_RELEASE_CAPSULE | Freq: Two times a day (BID) | ORAL | 0 refills | Status: DC

## 2024-04-13 ENCOUNTER — Telehealth (HOSPITAL_COMMUNITY): Payer: Self-pay | Admitting: Pharmacy Technician

## 2024-04-13 ENCOUNTER — Other Ambulatory Visit (HOSPITAL_COMMUNITY): Payer: Self-pay

## 2024-04-13 NOTE — Telephone Encounter (Signed)
 Advanced Heart Failure Patient Advocate Encounter  (Late entry) Prior Authorization for Amvuttra  has been approved.    PA# [id number] [facility]) Effective dates: 12/15/23 through 11/29/24  Patients co-pay is $279.52 (patient has grant to help cover co-pay).  Correne Dillon, CPhT

## 2024-04-17 ENCOUNTER — Other Ambulatory Visit: Payer: Self-pay | Admitting: Medical

## 2024-04-20 ENCOUNTER — Encounter: Payer: Self-pay | Admitting: Internal Medicine

## 2024-04-20 ENCOUNTER — Ambulatory Visit: Payer: PPO | Admitting: Internal Medicine

## 2024-04-20 VITALS — BP 124/70 | HR 81 | Ht 61.0 in | Wt 183.2 lb

## 2024-04-20 DIAGNOSIS — Z7984 Long term (current) use of oral hypoglycemic drugs: Secondary | ICD-10-CM | POA: Diagnosis not present

## 2024-04-20 DIAGNOSIS — N1832 Chronic kidney disease, stage 3b: Secondary | ICD-10-CM

## 2024-04-20 DIAGNOSIS — E1142 Type 2 diabetes mellitus with diabetic polyneuropathy: Secondary | ICD-10-CM

## 2024-04-20 DIAGNOSIS — E1122 Type 2 diabetes mellitus with diabetic chronic kidney disease: Secondary | ICD-10-CM

## 2024-04-20 DIAGNOSIS — Z7985 Long-term (current) use of injectable non-insulin antidiabetic drugs: Secondary | ICD-10-CM

## 2024-04-20 LAB — POCT GLYCOSYLATED HEMOGLOBIN (HGB A1C): Hemoglobin A1C: 6.8 % — AB (ref 4.0–5.6)

## 2024-04-20 MED ORDER — GLIPIZIDE 5 MG PO TABS: 5.0000 mg | ORAL_TABLET | Freq: Two times a day (BID) | ORAL | 3 refills | Status: DC

## 2024-04-20 NOTE — Progress Notes (Signed)
 Name: Donna Fuller  Age/ Sex: 83 y.o., female   MRN/ DOB: [id number], 07/06/1941     PCP: Francine Iron   Reason for Endocrinology Evaluation: Type 2 Diabetes Mellitus  Initial Endocrine Consultative Visit: 12/14/2019    PATIENT IDENTIFIER: [name] is a 83 y.o. female with a past medical history of T2DM, HTN, RA and Dyslipidemia. The patient has followed with Endocrinology clinic since 12/14/2019 for consultative assistance with management of her diabetes.  DIABETIC HISTORY:  Donna Fuller was diagnosed with T2DM many years ago. She was on metformin  due to low renal function, has been on Januvia , Farxia and insulin  as well. Her hemoglobin A1c has ranged from 6.0 %in 2015, peaking at 8.8% in 2020.  On her initial visit to our clinic her A1c was 8.8%   , she was on Trulicity  and pioglitazone , we added glipizide    Stopped Pioglitazone  03/2022 due to A1c 5.7 %   SUBJECTIVE:   During the last visit (10/19/2022): A1c 6.7%    Today (04/20/2024): Donna Fuller is here for follow-up on diabetes management. She checks her blood sugars 1 times daily.  The patient has not had hypoglycemic episodes since the last clinic visit.    She follows with Dr. Georgiann Kirsch for for seropositive rheumatoid arthritis  She also follows with nephrology for CKD with proteinuria  She also follows with cardiology for CHF, cardiac amyloidosis   NO nausea or  vomiting  Has fatigue  Denies diarrhea but has chronic constipation - takes stool softeners     HOME DIABETES REGIMEN:  Glipizide  5 mg , BID Trulicity  1.5 mg weekly    METER DOWNLOAD SUMMARY:  150-307  mg/dL     DIABETIC COMPLICATIONS: Microvascular complications:  Neuropathy  Denies: retinopathy , CKD Last eye exam: Completed 02/15/2024  Macrovascular complications:    Denies: CAD, PVD, CVA     HISTORY:  Past Medical History:  Past Medical History:  Diagnosis Date   Arthritis    Breast cancer (HCC) 2007    Right breast, s/p mastectomy   Chronic diastolic congestive heart failure (HCC)    per patient, dx by cardiology   Diabetes mellitus without complication (HCC)    GERD (gastroesophageal reflux disease)    Gout    Hyperlipidemia    Hypertension    Spinal stenosis    Past Surgical History:  Past Surgical History:  Procedure Laterality Date   ABDOMINAL HYSTERECTOMY     ABDOMINAL SURGERY     BREAST BIOPSY Left    2016 benign    BREAST BIOPSY Left 02/16/2023   MM LT BREAST BX W LOC DEV 1ST LESION IMAGE BX SPEC STEREO GUIDE 02/16/2023 [facility] MAMMOGRAPHY   LEFT HEART CATH AND CORONARY ANGIOGRAPHY N/A 11/21/2021   Procedure: LEFT HEART CATH AND CORONARY ANGIOGRAPHY;  Surgeon: Wenona Hamilton, MD;  Location: [facility] INVASIVE CV LAB;  Service: Cardiovascular;  Laterality: N/A;   MASTECTOMY     right side   REPLACEMENT TOTAL KNEE Bilateral    TUBAL LIGATION     Social History:  reports that she has quit smoking. Her smoking use included cigarettes. She has never been exposed to tobacco smoke. She has never used smokeless tobacco. She reports that she does not drink alcohol  and does not use drugs. Family History:  Family History  Problem Relation Age of Onset   Diabetes Mother    Alzheimer's disease Mother    Hypertension Mother    Hypertension Father    Heart disease  Father    Hyperlipidemia Father    Kidney disease Father    Diabetes Sister    Cancer Brother        lung   Alzheimer's disease Brother    Heart disease Brother    Hypertension Brother    Diabetes Brother    Hypertension Brother    Diabetes Brother    Hyperlipidemia Son    Hypertension Son    Diabetes Son    Hypertension Daughter    Hyperlipidemia Daughter      HOME MEDICATIONS: Allergies as of 04/20/2024       Reactions   Penicillins Swelling   Sulfa Antibiotics Swelling   Amlodipine Swelling   LE Swelling   Lisinopril Cough        Medication List        Accurate as of Apr 20, 2024 11:56 AM. If  you have any questions, ask your nurse or doctor.          acetaminophen  650 MG CR tablet Commonly known as: TYLENOL  Take 650 mg by mouth 2 (two) times daily.   albuterol  108 (90 Base) MCG/ACT inhaler Commonly known as: VENTOLIN  HFA INHALE 2 PUFFS BY MOUTH EVERY 6 HOURS AS NEEDED What changed: reasons to take this   Alcohol  Prep Pad 70 % Pads Use to check blood sugars What changed:  how much to take how to take this when to take this   Amvuttra  25 MG/0.5ML syringe Generic drug: vutrisiran  sodium Inject 0.5 mLs (25 mg total) into the skin every 3 (three) months.   ascorbic acid 1000 MG tablet Commonly known as: VITAMIN C Take 1,000 mg by mouth daily.   aspirin  EC 81 MG tablet Take 1 tablet (81 mg total) by mouth daily. Swallow whole.   Azelastine HCl 137 MCG/SPRAY Soln SMARTSIG:1-2 Spray(s) Both Nares Twice Daily   B-12 PO Take 1 tablet by mouth daily.   blood glucose meter kit and supplies Kit Dispense based on patient and insurance preference. Use up to four times daily as directed. (FOR ICD-9 250.00, 250.01). What changed:  how much to take how to take this when to take this   brimonidine  0.2 % ophthalmic solution Commonly known as: ALPHAGAN  Place 1 drop into both eyes 3 (three) times daily.   bumetanide  1 MG tablet Commonly known as: BUMEX  Take 2 mg by mouth daily.   carvedilol  12.5 MG tablet Commonly known as: COREG  Take 12.5 mg by mouth 2 (two) times daily.   empagliflozin  10 MG Tabs tablet Commonly known as: Jardiance  Take 1 tablet (10 mg total) by mouth daily before breakfast.   ezetimibe  10 MG tablet Commonly known as: ZETIA  Take 1 tablet (10 mg total) by mouth daily.   fluticasone  50 MCG/ACT nasal spray Commonly known as: FLONASE  SPRAY 2 SPRAYS INTO EACH NOSTRIL EVERY DAY   gabapentin  300 MG capsule Commonly known as: NEURONTIN  TAKE 1 CAPSULE BY MOUTH THREE TIMES A DAY   glipiZIDE  5 MG tablet Commonly known as: GLUCOTROL  Take 1  tablet (5 mg total) by mouth 2 (two) times daily before a meal.   ketorolac  0.5 % ophthalmic solution Commonly known as: ACULAR  INSTILL 1 DROP INTO RIGHT EYE 4 TIMES A DAY What changed: additional instructions   LANACANE EX Apply 1 application  topically daily as needed. Roll-on daily as needed for knee pain.   latanoprost  0.005 % ophthalmic solution Commonly known as: XALATAN  Place 1 drop into both eyes daily.   levocetirizine 5 MG tablet Commonly known  as: XYZAL  TAKE 1 TABLET BY MOUTH EVERY DAY IN THE EVENING   losartan  25 MG tablet Commonly known as: COZAAR  Take 25 mg by mouth daily.   ofloxacin 0.3 % ophthalmic solution Commonly known as: OCUFLOX INSTILL 1 DROP IN RIGHT EYE FOUR TIMES A DAY USE FOR 5 DAYS THEN STOP   omeprazole  40 MG capsule Commonly known as: PRILOSEC Take 1 capsule (40 mg total) by mouth in the morning and at bedtime.   OneTouch Delica Lancets 33G Misc Use as instructed to check blood sugar 2-3 times a day.  DX E11.9 What changed:  how much to take how to take this when to take this   OneTouch Ultra Test test strip Generic drug: glucose blood CHECK BLOOD SUGAR 2 TO 3 TIMES DAILY AS DIRECTED What changed: See the new instructions.   timolol  0.5 % ophthalmic solution Commonly known as: TIMOPTIC  Place 1 drop into both eyes 2 (two) times a day.   Trulicity  1.5 MG/0.5ML Soaj Generic drug: Dulaglutide  INJECT 1.5 MG (0.5ML) UNDER THE SKIN ONCE A WEEK   Vitamin A  2250 MCG (7500 UT) Caps Take 1 capsule by mouth daily. You can find this over the counter.   VITAMIN D-3 PO Take 2,000 Units by mouth daily.   Vyzulta 0.024 % Soln Generic drug: Latanoprostene Bunod Apply 1 drop to eye at bedtime.         OBJECTIVE:   Vital Signs: BP 124/70 (BP Location: Left Arm, Patient Position: Sitting, Cuff Size: Large)   Pulse 81   Ht 5\' 1"  (1.549 m)   Wt 183 lb 3.2 oz (83.1 kg)   SpO2 97%   BMI 34.62 kg/m   Wt Readings from Last 3 Encounters:   04/20/24 183 lb 3.2 oz (83.1 kg)  04/11/24 184 lb (83.5 kg)  03/23/24 184 lb 9.6 oz (83.7 kg)     Exam: General: Pt appears well and is in NAD  Lungs: Clear with good BS bilat   Heart: RRR   Extremities: No  pretibial edema.  Neuro: MS is good with appropriate affect, pt is alert and Ox3    DM foot exam:   04/20/2024  The skin of the feet is intact without sores or ulcerations. The pedal pulses are 1+ on right and 1+ on left. The sensation is intact to a screening 5.07, 10 gram monofilament bilaterally    DATA REVIEWED:  Lab Results  Component Value Date   HGBA1C 6.8 (A) 04/20/2024   HGBA1C 6.8 (A) 10/21/2023   HGBA1C 6.7 (A) 04/20/2023   Lab Results  Component Value Date   MICROALBUR 0.4 04/21/2017   LDLCALC 55 08/06/2023   CREATININE 1.77 (H) 02/21/2024    Latest Reference Range & Units 02/21/24 14:50  Sodium 134 - 144 mmol/L 140  Potassium 3.5 - 5.2 mmol/L 4.5  Chloride 96 - 106 mmol/L 96  CO2 20 - 29 mmol/L 28  Glucose 70 - 99 mg/dL 161 (H)  BUN 8 - 27 mg/dL 36 (H)  Creatinine 0.96 - 1.00 mg/dL 0.45 (H)  Calcium 8.7 - 10.3 mg/dL 9.9  BUN/Creatinine Ratio 12 - 28  20  eGFR >59 mL/min/1.73 28 (L)   ASSESSMENT / PLAN / RECOMMENDATIONS:   1) Type 2 Diabetes Mellitus, optimally controlled, With neuropathic and CKD III complications - Most recent A1c of 6.8 %. Goal A1c <7.5%.   - A1c at goal  - Stopped  Pioglitazone  due to LE edema/CHF  -Intolerant to higher doses of Trulicity  due to abdominal  pain - Intolerant to Farxiga   -Patient has been noted with occasional hyperglycemia with BG readings > 200 mg/dL.  Patient has been drinking juice, I did advise the patient to avoid all juices and all sugar sweetened beverages - No changes at this time   MEDICATIONS: -Continue glipizide  5 mg, BID -Continue Trulicity  1.5 mg weekly  EDUCATION / INSTRUCTIONS: BG monitoring instructions: Patient is instructed to check her blood sugars 2 times a day, fasting and  suppertime. Call [facility] if: BG persistently < 70  I reviewed the Rule of 15 for the treatment of hypoglycemia in detail with the patient. Literature supplied.    2) Diabetic complications:  Eye: Does not have known diabetic retinopathy.  Neuro/ Feet: Does  have known diabetic peripheral neuropathy. Renal: Patient does not have known baseline CKD. She is  on an ACEI/ARB at present.  F/U in 6 months  I spent 25 minutes preparing to see the patient by review of recent labs, imaging and procedures, obtaining and reviewing separately obtained history, communicating with the patient, ordering medications, tests or procedures, and documenting clinical information in the EHR including the differential Dx, treatment, and any further evaluation and other management    Signed electronically by: Natale Bail, MD  [facility] Endocrinology  [facility] [address] [place], [place] [address] Phone: [phone] FAX: [phone]   CC: Francine Iron [address] [place] [address] Phone: [phone]  Fax: [phone]  Return to Endocrinology clinic as below: Future Appointments  Date Time Provider Department Center  05/09/2024  2:00 PM Ricarda Challenger, PhD [facility]  05/31/2024  2:15 PM Alwin Baars, DO [facility] None  06/22/2024  2:00 PM [facility] None  02/07/2025 11:00 AM Nicholas Bari, MD [facility] None  04/17/2025  1:10 PM [facility] ANNUAL WELLNESS VISIT 2 [facility]

## 2024-04-20 NOTE — Patient Instructions (Signed)
-   Continue   trulicity 1.5 mg weekly  - Continue  Glipizide 5 mg , 1 tablet before Breakfast, and 1 tablet before supper     - HOW TO TREAT LOW BLOOD SUGARS (Blood sugar LESS THAN 70 MG/DL) Please follow the RULE OF 15 for the treatment of hypoglycemia treatment (when your (blood sugars are less than 70 mg/dL)   STEP 1: Take 15 grams of carbohydrates when your blood sugar is low, which includes:  3-4 GLUCOSE TABS  OR 3-4 OZ OF JUICE OR REGULAR SODA OR ONE TUBE OF GLUCOSE GEL    STEP 2: RECHECK blood sugar in 15 MINUTES STEP 3: If your blood sugar is still low at the 15 minute recheck --> then, go back to STEP 1 and treat AGAIN with another 15 grams of carbohydrates.

## 2024-05-09 ENCOUNTER — Ambulatory Visit: Payer: PPO | Admitting: Genetic Counselor

## 2024-05-12 ENCOUNTER — Other Ambulatory Visit: Payer: Self-pay

## 2024-05-12 MED ORDER — TRULICITY 1.5 MG/0.5ML ~~LOC~~ SOAJ: SUBCUTANEOUS | 3 refills | Status: DC

## 2024-05-15 ENCOUNTER — Telehealth: Payer: Self-pay

## 2024-05-15 ENCOUNTER — Other Ambulatory Visit (HOSPITAL_COMMUNITY): Payer: Self-pay

## 2024-05-15 DIAGNOSIS — H401122 Primary open-angle glaucoma, left eye, moderate stage: Secondary | ICD-10-CM | POA: Diagnosis not present

## 2024-05-15 DIAGNOSIS — H401113 Primary open-angle glaucoma, right eye, severe stage: Secondary | ICD-10-CM | POA: Diagnosis not present

## 2024-05-15 NOTE — Telephone Encounter (Signed)
 Pharmacy Patient Advocate Encounter   Received notification from CoverMyMeds that prior authorization for Trulicity  1.5MG /0.5ML auto-injectors is required/requested.   Insurance verification completed.   The patient is insured through [facility] ADVANTAGE/RX ADVANCE .   Per test claim: PA required; PA submitted to above mentioned insurance via CoverMyMeds Key/confirmation #/EOC [id number] Status is pending

## 2024-05-15 NOTE — Telephone Encounter (Signed)
 Pharmacy Patient Advocate Encounter  Received notification from [facility] ADVANTAGE/RX ADVANCE that Prior Authorization for Trulicity  1.5MG /0.5ML auto-injectors  has been APPROVED from 05/15/24 to 05/15/25. Ran test claim, Copay is $0. This test claim was processed through [facility]- copay amounts may vary at other pharmacies due to pharmacy/plan contracts, or as the patient moves through the different stages of their insurance plan.   PA #/Case ID/Reference #: [id number]

## 2024-05-16 NOTE — Telephone Encounter (Signed)
 Patient notified

## 2024-05-31 ENCOUNTER — Other Ambulatory Visit
Admission: RE | Admit: 2024-05-31 | Discharge: 2024-05-31 | Disposition: A | Discharge status: Home / Self Care | Source: Ambulatory Visit | Attending: Cardiology | Admitting: Cardiology

## 2024-05-31 ENCOUNTER — Encounter: Payer: Self-pay | Admitting: Cardiology

## 2024-05-31 ENCOUNTER — Ambulatory Visit: Admitting: Cardiology

## 2024-05-31 VITALS — BP 111/58 | HR 62 | Wt 183.0 lb

## 2024-05-31 DIAGNOSIS — Z79899 Other long term (current) drug therapy: Secondary | ICD-10-CM | POA: Insufficient documentation

## 2024-05-31 DIAGNOSIS — Z7985 Long-term (current) use of injectable non-insulin antidiabetic drugs: Secondary | ICD-10-CM | POA: Diagnosis not present

## 2024-05-31 DIAGNOSIS — G629 Polyneuropathy, unspecified: Secondary | ICD-10-CM | POA: Diagnosis not present

## 2024-05-31 DIAGNOSIS — G5603 Carpal tunnel syndrome, bilateral upper limbs: Secondary | ICD-10-CM | POA: Diagnosis not present

## 2024-05-31 DIAGNOSIS — I5032 Chronic diastolic (congestive) heart failure: Secondary | ICD-10-CM | POA: Diagnosis not present

## 2024-05-31 DIAGNOSIS — M545 Low back pain, unspecified: Secondary | ICD-10-CM | POA: Insufficient documentation

## 2024-05-31 DIAGNOSIS — E1143 Type 2 diabetes mellitus with diabetic autonomic (poly)neuropathy: Secondary | ICD-10-CM | POA: Diagnosis not present

## 2024-05-31 DIAGNOSIS — I2510 Atherosclerotic heart disease of native coronary artery without angina pectoris: Secondary | ICD-10-CM | POA: Diagnosis not present

## 2024-05-31 DIAGNOSIS — E854 Organ-limited amyloidosis: Secondary | ICD-10-CM | POA: Diagnosis not present

## 2024-05-31 DIAGNOSIS — N189 Chronic kidney disease, unspecified: Secondary | ICD-10-CM | POA: Diagnosis not present

## 2024-05-31 DIAGNOSIS — I130 Hypertensive heart and chronic kidney disease with heart failure and stage 1 through stage 4 chronic kidney disease, or unspecified chronic kidney disease: Secondary | ICD-10-CM | POA: Diagnosis not present

## 2024-05-31 DIAGNOSIS — I43 Cardiomyopathy in diseases classified elsewhere: Secondary | ICD-10-CM

## 2024-05-31 DIAGNOSIS — Z7984 Long term (current) use of oral hypoglycemic drugs: Secondary | ICD-10-CM | POA: Insufficient documentation

## 2024-05-31 DIAGNOSIS — E1122 Type 2 diabetes mellitus with diabetic chronic kidney disease: Secondary | ICD-10-CM | POA: Diagnosis not present

## 2024-05-31 DIAGNOSIS — N1832 Chronic kidney disease, stage 3b: Secondary | ICD-10-CM | POA: Diagnosis not present

## 2024-05-31 LAB — BASIC METABOLIC PANEL WITH GFR
Anion gap: 11 (ref 5–15)
BUN: 29 mg/dL — ABNORMAL HIGH (ref 8–23)
CO2: 27 mmol/L (ref 22–32)
Calcium: 9.4 mg/dL (ref 8.9–10.3)
Chloride: 100 mmol/L (ref 98–111)
Creatinine, Ser: 1.71 mg/dL — ABNORMAL HIGH (ref 0.44–1.00)
GFR, Estimated: 30 mL/min — ABNORMAL LOW (ref >60)
Glucose, Bld: 64 mg/dL — ABNORMAL LOW (ref 70–99)
Potassium: 3.6 mmol/L (ref 3.5–5.1)
Sodium: 138 mmol/L (ref 135–145)

## 2024-05-31 LAB — BRAIN NATRIURETIC PEPTIDE: B Natriuretic Peptide: 441.8 pg/mL — ABNORMAL HIGH (ref 0.0–100.0)

## 2024-05-31 MED ORDER — LOSARTAN POTASSIUM 25 MG PO TABS: 12.5000 mg | ORAL_TABLET | Freq: Every day | ORAL | 3 refills | Status: AC

## 2024-05-31 NOTE — Progress Notes (Signed)
 ADVANCED HEART FAILURE CLINIC NOTE  Referring Physician: Dorina Loving, PA-C  Primary Care: Dorina Loving RIGGERS  CC: HFpEF, cardiac amyloid  HPI: Donna Fuller is a 83 y.o. 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  Her cardiac history dates back to at least 2022 when she presented to [facility] with complaints of substernal chest pressure.  Patient underwent left heart catheterization that demonstrated only mild nonobstructive CAD of the LAD.  Echocardiogram with moderate to severe LVH and grade 2 diastolic dysfunction with LV ejection fraction of 60 to 65%.  Since that time patient reports she has had a continued decline in her functional status.  She was admitted to [facility] in July and December 2024 with acute on chronic HFpEF exacerbations.  In December 2024 she was diuresed with IV Lasix  and discharged home on torsemide  10 mg daily.  She reports having bilateral carpal tunnel syndrome, peripheral neuropathy and low back pain.    Interval history - Doing fairly well; uses a cane to ambulate.  - Mild weight gain over the past few weeks of 4-5lbs; she briefly increased her bumex , however, is hypervolemic today.  - Complaining of cough; seen by ENT, reported to be secondary to allergies.    Past Medical History:  Diagnosis Date   Arthritis    Breast cancer [facility]) 2007   Right breast, s/p mastectomy   Chronic diastolic congestive heart failure (HCC)    per patient, dx by cardiology   Diabetes mellitus without complication (HCC)    GERD (gastroesophageal reflux disease)    Gout    Hyperlipidemia    Hypertension    Spinal stenosis     Current Outpatient Medications  Medication Sig Dispense Refill   acetaminophen  (TYLENOL ) 650 MG CR tablet Take 650 mg by mouth 2 (two) times daily.     albuterol  (VENTOLIN  HFA) 108 (90 Base) MCG/ACT inhaler INHALE 2 PUFFS BY MOUTH EVERY 6  HOURS AS NEEDED (Patient taking differently: Inhale 2 puffs into the lungs every 6 (six) hours as needed for wheezing or shortness of breath.) 8.5 each 2   Alcohol  Swabs (ALCOHOL  PREP PAD) 70 % PADS Use to check blood sugars (Patient taking differently: 1 each by Other route See admin instructions. Use to check blood sugars) 100 each 3   ascorbic acid (VITAMIN C) 1000 MG tablet Take 1,000 mg by mouth daily.     aspirin  EC 81 MG tablet Take 1 tablet (81 mg total) by mouth daily. Swallow whole. 90 tablet 3   Azelastine HCl 137 MCG/SPRAY SOLN SMARTSIG:1-2 Spray(s) Both Nares Twice Daily     Benzocaine-Benzethonium (LANACANE EX) Apply 1 application  topically daily as needed. Roll-on daily as needed for knee pain.     blood glucose meter kit and supplies KIT Dispense based on patient and insurance preference. Use up to four times daily as directed. (FOR ICD-9 250.00, 250.01). (Patient taking differently: Inject 1 each into the skin as directed. Dispense based on patient and insurance preference. Use up to four times daily as directed. (FOR ICD-9 250.00, 250.01).) 1 each 11   brimonidine  (ALPHAGAN ) 0.2 % ophthalmic solution Place 1 drop into both eyes 3 (three) times daily.     bumetanide  (BUMEX ) 1 MG tablet Take 2 mg by mouth daily.     Cholecalciferol (VITAMIN D-3 PO) Take 2,000 Units by mouth daily.     Cyanocobalamin  (B-12 PO) Take 1 tablet by mouth daily.  Dulaglutide  (TRULICITY ) 1.5 MG/0.5ML SOAJ INJECT 1.5 MG (0.5ML) UNDER THE SKIN ONCE A WEEK 6 mL 3   empagliflozin  (JARDIANCE ) 10 MG TABS tablet Take 1 tablet (10 mg total) by mouth daily before breakfast. 30 tablet 5   ezetimibe  (ZETIA ) 10 MG tablet Take 1 tablet (10 mg total) by mouth daily. 90 tablet 3   fluticasone  (FLONASE ) 50 MCG/ACT nasal spray SPRAY 2 SPRAYS INTO EACH NOSTRIL EVERY DAY 48 mL 1   gabapentin  (NEURONTIN ) 300 MG capsule TAKE 1 CAPSULE BY MOUTH THREE TIMES A DAY 270 capsule 1   glipiZIDE  (GLUCOTROL ) 5 MG tablet Take 1 tablet  (5 mg total) by mouth 2 (two) times daily before a meal. 180 tablet 3   ketorolac  (ACULAR ) 0.5 % ophthalmic solution INSTILL 1 DROP INTO RIGHT EYE 4 TIMES A DAY (Patient taking differently: INSTILL 1 DROP INTO RIGHT EYE 3 TIMES A DAY) 5 mL 6   latanoprost  (XALATAN ) 0.005 % ophthalmic solution Place 1 drop into both eyes daily.     levocetirizine (XYZAL ) 5 MG tablet TAKE 1 TABLET BY MOUTH EVERY DAY IN THE EVENING 90 tablet 1   ofloxacin (OCUFLOX) 0.3 % ophthalmic solution INSTILL 1 DROP IN RIGHT EYE FOUR TIMES A DAY USE FOR 5 DAYS THEN STOP     omeprazole  (PRILOSEC) 40 MG capsule Take 1 capsule (40 mg total) by mouth in the morning and at bedtime. 180 capsule 0   OneTouch Delica Lancets 33G MISC Use as instructed to check blood sugar 2-3 times a day.  DX E11.9 (Patient taking differently: 1 each by Other route See admin instructions. Use as instructed to check blood sugar 2-3 times a day.  DX E11.9) 100 each 3   ONETOUCH ULTRA TEST test strip CHECK BLOOD SUGAR 2 TO 3 TIMES DAILY AS DIRECTED (Patient taking differently: 1 each by Other route as needed for other.) 100 strip 5   timolol  (TIMOPTIC ) 0.5 % ophthalmic solution Place 1 drop into both eyes 2 (two) times a day.     Vitamin A  2250 MCG (7500 UT) CAPS Take 1 capsule by mouth daily. You can find this over the counter. 30 capsule 11   vutrisiran  sodium (AMVUTTRA ) 25 MG/0.5ML syringe Inject 0.5 mLs (25 mg total) into the skin every 3 (three) months. 0.5 mL 3   VYZULTA 0.024 % SOLN Apply 1 drop to eye at bedtime.     losartan  (COZAAR ) 25 MG tablet Take 0.5 tablets (12.5 mg total) by mouth daily. 45 tablet 3   No current facility-administered medications for this visit.    PHYSICAL EXAM: Vitals:   05/31/24 1409  BP: (!) 111/58  Pulse: 62  SpO2: 97%   GENERAL: NAD Lungs- CTA CARDIAC:  JVP: 8 cm          Normal rate with regular rhythm. no murmur.  Pulses 2+. 1+ edema.  ABDOMEN: Soft, non-tender, non-distended.  EXTREMITIES: Warm and well  perfused.  NEUROLOGIC: No obvious FND   DATA REVIEW  ECG: 11/08/23: Normal sinus rhythm and first-degree AV block as per my personal interpretation  ECHO: 11/07/23: LVEF 60 to 65% with severe LVH and grade 3 diastolic dysfunction as per my personal interpretation  CATH: LHC, 2022:    Mid LAD lesion is 20% stenosed.   LV end diastolic pressure is mildly elevated.   1.  Mild nonobstructive coronary artery disease. 2.  Left ventricular angiography was not performed.  EF was normal by echo. 3.  Mildly elevated left ventricular end-diastolic pressure at  18 mmHg.   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  -Multiple myeloma panel negative -PYP scan scheduled for early February 2025 -Genetic panel positive for cardiac amyloid, ATTR ([id number]) (11/16/23) -Mildly hypervolemic on exam today; take extra bumex  1mg  today & tomorrow in addition to her 1mg  BID.  -Now receiving Ammutra injections by pharmD; she will need to take her next injection a few days earlier due to a planned vacation. Discussed with pharmD today.   2. HFpEF, restrictive heart disease -Secondary to cardiac amyloid - Will continue bumex ; repeat BMP/BNP - Continue jardiance  10mg  daily - Will consider addition of finerenone.   3. Peripheral neuropathy - Reports mild sensory deficits in her fingers with carpal tunnel like symptoms.  - Her A1C has ranged from 6 to 6.8 since 2022. - Now on amvuttra ; continue gabapentin  300mg  TID - continues to have neuropathy in her lower extremities.   4. T2DM - A1C from 6.8 to 6 ranging from 2022 to 2024 - Continue trulicity  - continue jardiance  10mg  daily  5. Hypertension  - decrease losartan  to 12.5mg  daily; goal SBP>120 in the setting of autonomic dysfunction from amyloid; LVEF is normal.   6. CKD -repeat BMP/BNP today  I spent 35 minutes caring for this  patient today including face to face time, ordering and reviewing labs, reviewing labs, discussing above plan, treating high risk condition, seeing the patient, documenting in the record, and arranging follow ups.    Donna Fuller Advanced Heart Failure Mechanical Circulatory Support

## 2024-05-31 NOTE — Patient Instructions (Addendum)
 Medication Changes:  DECREASE Losartan  12.5 (1/2 tab) daily  TODAY AND TOMORROW ONLY take 1 extra Bumex   Lab Work:  Go over to the [facility]. Go pass the gift shop and have your blood work completed.  We will only call you if the results are abnormal or if the provider would like to make medication changes.   Follow-Up in: Please follow up with the [facility] pharmacist in 2 months.  At the [facility], you and your health needs are our priority. We have a designated team specialized in the treatment of Heart Failure. This Care Team includes your primary Heart Failure Specialized Cardiologist (physician), Advanced Practice Providers (APPs- Physician Assistants and Nurse Practitioners), and Pharmacist who all work together to provide you with the care you need, when you need it.   You may see any of the following providers on your designated Care Team at your next follow up:  Dr. Toribio Fuel Dr. Ezra Shuck Dr. Ria Commander Dr. Odis Brownie Ellouise Class, FNP Jaun Bash, RPH-CPP  Please be sure to bring in all your medications bottles to every appointment.   Need to Contact Us :  If you have any questions or concerns before your next appointment please send us  a message through [facility] [place] or call our office at [phone].    TO LEAVE A MESSAGE FOR THE NURSE SELECT OPTION 2, PLEASE LEAVE A MESSAGE INCLUDING: YOUR NAME DATE OF BIRTH CALL BACK NUMBER REASON FOR CALL**this is important as we prioritize the call backs  YOU WILL RECEIVE A CALL BACK THE SAME DAY AS LONG AS YOU CALL BEFORE 4:00 PM

## 2024-06-05 ENCOUNTER — Ambulatory Visit: Admitting: Medical

## 2024-06-05 ENCOUNTER — Ambulatory Visit: Payer: Self-pay

## 2024-06-05 NOTE — Telephone Encounter (Signed)
 FYI Only or Action Required?: Action required by provider: request for appointment.  Patient was last seen in primary care on 01/17/2024 by Dorina Loving, PA-C.  Called Nurse Triage reporting Foot Pain.  Symptoms began yesterday.  Interventions attempted: OTC medications: Tylenol .  Symptoms are: unchanged.  Triage Disposition: See Physician Within 24 Hours  Patient/caregiver understands and will follow disposition?: Yes        Copied from CRM [id number]. Topic: Clinical - Red Word Triage >> Jun 05, 2024  3:12 PM Drema MATSU wrote: [facility] that prompted transfer to Nurse Triage: Patient has left foot pain and is swollen around the ankle. Patient is afraid that it is Neuropathy. Reason for Disposition  [1] Swollen foot AND [2] no fever  (Exceptions: localized bump from bunions, calluses, insect bite, sting)  Answer Assessment - Initial Assessment Questions 1. ONSET: When did the pain start?      Yesterday  2. LOCATION: Where is the pain located?      Left ankle  3. PAIN: How bad is the pain?    (Scale 1-10; or mild, moderate, severe)  - MILD (1-3): doesn't interfere with normal activities.   - MODERATE (4-7): interferes with normal activities (e.g., work or school) or awakens from sleep, limping.   - SEVERE (8-10): excruciating pain, unable to do any normal activities, unable to walk.      9 4. WORK OR EXERCISE: Has there been any recent work or exercise that involved this part of the body?      Na  5. CAUSE: What do you think is causing the foot pain?     Possibly neuropathy  6. OTHER SYMPTOMS: Do you have any other symptoms? (e.g., leg pain, rash, fever, numbness)     Swelling in left foot.     When putting weight on foot, ankle hurts. Some swelling. No known injury.  Under toes is painful. Pt has taken tylenol  for pain. RN advised elevation and ice until appt 7/8.  Protocols used: Foot Pain-A-AH

## 2024-06-06 ENCOUNTER — Ambulatory Visit: Payer: Self-pay | Admitting: Medical

## 2024-06-06 ENCOUNTER — Ambulatory Visit (HOSPITAL_BASED_OUTPATIENT_CLINIC_OR_DEPARTMENT_OTHER)
Admission: RE | Admit: 2024-06-06 | Discharge: 2024-06-06 | Disposition: A | Discharge status: Home / Self Care | Source: Ambulatory Visit | Attending: Medical | Admitting: Medical

## 2024-06-06 ENCOUNTER — Other Ambulatory Visit (HOSPITAL_BASED_OUTPATIENT_CLINIC_OR_DEPARTMENT_OTHER): Payer: Self-pay

## 2024-06-06 ENCOUNTER — Ambulatory Visit: Admitting: Medical

## 2024-06-06 VITALS — BP 112/72 | HR 72 | Temp 98.7°F | Ht 61.0 in | Wt 186.0 lb

## 2024-06-06 DIAGNOSIS — Z7984 Long term (current) use of oral hypoglycemic drugs: Secondary | ICD-10-CM | POA: Diagnosis not present

## 2024-06-06 DIAGNOSIS — M79609 Pain in unspecified limb: Secondary | ICD-10-CM

## 2024-06-06 DIAGNOSIS — M25572 Pain in left ankle and joints of left foot: Secondary | ICD-10-CM

## 2024-06-06 DIAGNOSIS — M79672 Pain in left foot: Secondary | ICD-10-CM | POA: Insufficient documentation

## 2024-06-06 DIAGNOSIS — R600 Localized edema: Secondary | ICD-10-CM

## 2024-06-06 DIAGNOSIS — E1165 Type 2 diabetes mellitus with hyperglycemia: Secondary | ICD-10-CM

## 2024-06-06 DIAGNOSIS — M79605 Pain in left leg: Secondary | ICD-10-CM | POA: Diagnosis not present

## 2024-06-06 DIAGNOSIS — Z7985 Long-term (current) use of injectable non-insulin antidiabetic drugs: Secondary | ICD-10-CM | POA: Diagnosis not present

## 2024-06-06 DIAGNOSIS — M7989 Other specified soft tissue disorders: Secondary | ICD-10-CM | POA: Diagnosis not present

## 2024-06-06 LAB — CBC WITH DIFFERENTIAL/PLATELET
Basophils Absolute: 0 K/uL (ref 0.0–0.1)
Basophils Relative: 0.5 % (ref 0.0–3.0)
Eosinophils Absolute: 0.1 K/uL (ref 0.0–0.7)
Eosinophils Relative: 1.4 % (ref 0.0–5.0)
HCT: 40 % (ref 36.0–46.0)
Hemoglobin: 13.2 g/dL (ref 12.0–15.0)
Lymphocytes Relative: 28 % (ref 12.0–46.0)
Lymphs Abs: 1.8 K/uL (ref 0.7–4.0)
MCHC: 32.9 g/dL (ref 30.0–36.0)
MCV: 92.8 fl (ref 78.0–100.0)
Monocytes Absolute: 0.5 K/uL (ref 0.1–1.0)
Monocytes Relative: 7.9 % (ref 3.0–12.0)
Neutro Abs: 4 K/uL (ref 1.4–7.7)
Neutrophils Relative %: 62.2 % (ref 43.0–77.0)
Platelets: 155 K/uL (ref 150.0–400.0)
RBC: 4.32 Mil/uL (ref 3.87–5.11)
RDW: 15.3 % (ref 11.5–15.5)
WBC: 6.4 K/uL (ref 4.0–10.5)

## 2024-06-06 LAB — URIC ACID: Uric Acid, Serum: 9.6 mg/dL — ABNORMAL HIGH (ref 2.4–7.0)

## 2024-06-06 MED ORDER — METHYLPREDNISOLONE 4 MG PO TABS: ORAL_TABLET | ORAL | 0 refills | Status: DC

## 2024-06-06 NOTE — Progress Notes (Signed)
 Subjective:    Patient ID: Fidela ONEIDA Antigua, female    DOB: 11/20/1941, 83 y.o.   MRN: [id number]  HPI  SANAI FRICK is an 83 year old female with diabetes who presents with right foot swelling and pain.  She has been experiencing swelling and pain in her left  foot since Sunday, primarily located on the side of her ankle and the bottom of her foot, above the toes. The pain is described as sharp, feeling like 'something was sticking in it.' The swelling is significant enough to prevent her from putting pressure on the foot. She has been using ice and taking Tylenol , which have provided some relief.  No fever, chills, or sweats. She has a history of neuropathy, primarily in the right foot, but notes that the current swelling and pain are more pronounced than usual.  Her blood sugar was notably high at 255 mg/dL before breakfast on the day of the visit, despite being on Trulicity , Jardiance , and glipizide . She takes Trulicity  once a week and is unsure why her blood sugar was elevated.   Mild rt toe pain artribue to chronic neuropathy.  Review of Systems  Constitutional:  Negative for chills and fever.  Respiratory:  Negative for cough, chest tightness and wheezing.   Cardiovascular:  Negative for chest pain and palpitations.  Gastrointestinal:  Negative for anal bleeding, nausea and vomiting.  Genitourinary:  Negative for dysuria, flank pain, genital sores, pelvic pain and urgency.  Musculoskeletal:  Negative for back pain and myalgias.       See hpi.  Skin:  Negative for rash and wound.  Neurological:  Negative for dizziness, speech difficulty, weakness and light-headedness.  Hematological:  Negative for adenopathy. Does not bruise/bleed easily.  Psychiatric/Behavioral:  Negative for behavioral problems and confusion.     Past Medical History:  Diagnosis Date   Arthritis    Breast cancer [facility]) 2007   Right breast, s/p mastectomy   Chronic diastolic congestive heart failure  (HCC)    per patient, dx by cardiology   Diabetes mellitus without complication (HCC)    GERD (gastroesophageal reflux disease)    Gout    Hyperlipidemia    Hypertension    Spinal stenosis      Social History   Socioeconomic History   Marital status: Married    Spouse name: Jimmy   Number of children: 3   Years of education: 12   Highest education level: Not on file  Occupational History   Occupation: Retired  Tobacco Use   Smoking status: Former    Types: Cigarettes    Passive exposure: Never   Smokeless tobacco: Never  Vaping Use   Vaping status: Never Used  Substance and Sexual Activity   Alcohol  use: No   Drug use: No   Sexual activity: Not Currently  Other Topics Concern   Not on file  Social History Narrative   Marital Status:  Married Hydrologist)    Children:  Daughter(1) Son (1)    Pets:  None    Living Situation: Lives with husband and daughter.     Occupation:  Retired Contractor)    Education: 12th Grade    Tobacco Use/Exposure:  She used to smoke socially during the weekends but quit 40 years ago.    Alcohol  Use:  Occasional   Drug Use:  None   Diet:  Regular   Exercise:  None   Hobbies:  Reading and playing volleyball  Social Drivers of Corporate investment banker Strain: Low Risk  (04/11/2024)   Overall Financial Resource Strain (CARDIA)    Difficulty of Paying Living Expenses: Not hard at all  Food Insecurity: No Food Insecurity (04/11/2024)   Hunger Vital Sign    Worried About Running Out of Food in the Last Year: Never true    Ran Out of Food in the Last Year: Never true  Transportation Needs: No Transportation Needs (04/11/2024)   PRAPARE - Administrator, Civil Service (Medical): No    Lack of Transportation (Non-Medical): No  Physical Activity: Inactive (04/11/2024)   Exercise Vital Sign    Days of Exercise per Week: 0 days    Minutes of Exercise per Session: 0 min  Stress: No Stress Concern Present (04/11/2024)   [employer] of Occupational Health - Occupational Stress Questionnaire    Feeling of Stress : Not at all  Social Connections: Moderately Integrated (04/11/2024)   Social Connection and Isolation Panel    Frequency of Communication with Friends and Family: More than three times a week    Frequency of Social Gatherings with Friends and Family: More than three times a week    Attends Religious Services: More than 4 times per year    Active Member of [employer] or Organizations: No    Attends Banker Meetings: Never    Marital Status: Married  Catering manager Violence: Not At Risk (04/11/2024)   Humiliation, Afraid, Rape, and Kick questionnaire    Fear of Current or Ex-Partner: No    Emotionally Abused: No    Physically Abused: No    Sexually Abused: No    Past Surgical History:  Procedure Laterality Date   ABDOMINAL HYSTERECTOMY     ABDOMINAL SURGERY     BREAST BIOPSY Left    2016 benign    BREAST BIOPSY Left 02/16/2023   MM LT BREAST BX W LOC DEV 1ST LESION IMAGE BX SPEC STEREO GUIDE 02/16/2023 [facility]MO[facility]   LEFT HEART CATH AND CORONARY ANGIOGRAPHY N/A 11/21/2021   Procedure: LEFT HEART CATH AND CORONARY ANGIOGRAPHY;  Surgeon: Darron Deatrice LABOR, MD;  Location: [facility] INVASIVE CV LAB;  Service: Cardiovascular;  Laterality: N/A;   MASTECTOMY     right side   REPLACEMENT TOTAL KNEE Bilateral    TUBAL LIGATION      Family History  Problem Relation Age of Onset   Diabetes Mother    Alzheimer's disease Mother    Hypertension Mother    Hypertension Father    Heart disease Father    Hyperlipidemia Father    Kidney disease Father    Diabetes Sister    Cancer Brother        lung   Alzheimer's disease Brother    Heart disease Brother    Hypertension Brother    Diabetes Brother    Hypertension Brother    Diabetes Brother    Hyperlipidemia Son    Hypertension Son    Diabetes Son    Hypertension Daughter    Hyperlipidemia Daughter     Allergies  Allergen Reactions    Penicillins Swelling   Sulfa Antibiotics Swelling   Amlodipine Swelling    LE Swelling   Lisinopril Cough    Current Outpatient Medications on File Prior to Visit  Medication Sig Dispense Refill   acetaminophen  (TYLENOL ) 650 MG CR tablet Take 650 mg by mouth 2 (two) times daily.     albuterol  (VENTOLIN  HFA) 108 (90 Base) MCG/ACT inhaler  INHALE 2 PUFFS BY MOUTH EVERY 6 HOURS AS NEEDED (Patient taking differently: Inhale 2 puffs into the lungs every 6 (six) hours as needed for wheezing or shortness of breath.) 8.5 each 2   Alcohol  Swabs (ALCOHOL  PREP PAD) 70 % PADS Use to check blood sugars (Patient taking differently: 1 each by Other route See admin instructions. Use to check blood sugars) 100 each 3   ascorbic acid (VITAMIN C) 1000 MG tablet Take 1,000 mg by mouth daily.     aspirin  EC 81 MG tablet Take 1 tablet (81 mg total) by mouth daily. Swallow whole. 90 tablet 3   Azelastine HCl 137 MCG/SPRAY SOLN SMARTSIG:1-2 Spray(s) Both Nares Twice Daily     Benzocaine-Benzethonium (LANACANE EX) Apply 1 application  topically daily as needed. Roll-on daily as needed for knee pain.     blood glucose meter kit and supplies KIT Dispense based on patient and insurance preference. Use up to four times daily as directed. (FOR ICD-9 250.00, 250.01). (Patient taking differently: Inject 1 each into the skin as directed. Dispense based on patient and insurance preference. Use up to four times daily as directed. (FOR ICD-9 250.00, 250.01).) 1 each 11   brimonidine  (ALPHAGAN ) 0.2 % ophthalmic solution Place 1 drop into both eyes 3 (three) times daily.     bumetanide  (BUMEX ) 1 MG tablet Take 2 mg by mouth daily.     Cholecalciferol (VITAMIN D-3 PO) Take 2,000 Units by mouth daily.     Cyanocobalamin  (B-12 PO) Take 1 tablet by mouth daily.     Dulaglutide  (TRULICITY ) 1.5 MG/0.5ML SOAJ INJECT 1.5 MG (0.5ML) UNDER THE SKIN ONCE A WEEK 6 mL 3   empagliflozin  (JARDIANCE ) 10 MG TABS tablet Take 1 tablet (10 mg total)  by mouth daily before breakfast. 30 tablet 5   ezetimibe  (ZETIA ) 10 MG tablet Take 1 tablet (10 mg total) by mouth daily. 90 tablet 3   fluticasone  (FLONASE ) 50 MCG/ACT nasal spray SPRAY 2 SPRAYS INTO EACH NOSTRIL EVERY DAY 48 mL 1   gabapentin  (NEURONTIN ) 300 MG capsule TAKE 1 CAPSULE BY MOUTH THREE TIMES A DAY 270 capsule 1   glipiZIDE  (GLUCOTROL ) 5 MG tablet Take 1 tablet (5 mg total) by mouth 2 (two) times daily before a meal. 180 tablet 3   ketorolac  (ACULAR ) 0.5 % ophthalmic solution INSTILL 1 DROP INTO RIGHT EYE 4 TIMES A DAY (Patient taking differently: INSTILL 1 DROP INTO RIGHT EYE 3 TIMES A DAY) 5 mL 6   latanoprost  (XALATAN ) 0.005 % ophthalmic solution Place 1 drop into both eyes daily.     levocetirizine (XYZAL ) 5 MG tablet TAKE 1 TABLET BY MOUTH EVERY DAY IN THE EVENING 90 tablet 1   losartan  (COZAAR ) 25 MG tablet Take 0.5 tablets (12.5 mg total) by mouth daily. 45 tablet 3   ofloxacin (OCUFLOX) 0.3 % ophthalmic solution INSTILL 1 DROP IN RIGHT EYE FOUR TIMES A DAY USE FOR 5 DAYS THEN STOP     omeprazole  (PRILOSEC) 40 MG capsule Take 1 capsule (40 mg total) by mouth in the morning and at bedtime. 180 capsule 0   OneTouch Delica Lancets 33G MISC Use as instructed to check blood sugar 2-3 times a day.  DX E11.9 (Patient taking differently: 1 each by Other route See admin instructions. Use as instructed to check blood sugar 2-3 times a day.  DX E11.9) 100 each 3   ONETOUCH ULTRA TEST test strip CHECK BLOOD SUGAR 2 TO 3 TIMES DAILY AS DIRECTED (Patient taking differently:  1 each by Other route as needed for other.) 100 strip 5   timolol  (TIMOPTIC ) 0.5 % ophthalmic solution Place 1 drop into both eyes 2 (two) times a day.     Vitamin A  2250 MCG (7500 UT) CAPS Take 1 capsule by mouth daily. You can find this over the counter. 30 capsule 11   vutrisiran  sodium (AMVUTTRA ) 25 MG/0.5ML syringe Inject 0.5 mLs (25 mg total) into the skin every 3 (three) months. 0.5 mL 3   VYZULTA 0.024 % SOLN Apply  1 drop to eye at bedtime.     No current facility-administered medications on file prior to visit.    BP 112/72   Pulse 72   Temp 98.7 F (37.1 C) (Oral)   Ht 5' 1 (1.549 m)   Wt 186 lb (84.4 kg)   SpO2 98%   BMI 35.14 kg/m        Objective:   Physical Exam   General- No acute distress. Pleasant patient. Neck- Full range of motion, no jvd Lungs- Clear, even and unlabored. Heart- regular rate and rhythm. Neurologic- CNII- XII grossly intact.  Left lower ext- mild popliteal bulge and mild tender on palpation. Left ankle and foot- slight mild discoloration/dark appearance compared to rt side. Warmth but mild swelling in talofibular area and tender. Distal metatarsal head region swollen and mild tender. No breakdown or ulcer of foot. Rt foot- not swollen or tender.  Rt lower ext- calf not swollen. No pedal edema.      Assessment & Plan:  [phone] Pt number [phone] Shelia   Right foot and ankle swelling and pain Acute swelling and pain in the right foot and ankle with differential diagnosis including early skin infection, gout or DVT. Neuropathy present but not primary cause. - Order CBC for infection assessment.(stat) - Order uric acid level for gout evaluation.(stat) - Order lower extremity Doppler ultrasound to rule out DVT.(stat) - Order x-ray of the foot and ankle.(stat) - Advise ice and elevation. - Recommend Tylenol  for pain management. -may advise additional med but need to assess above stat studies.  Diabetes mellitus Elevated blood sugar at 255 mg/dL. On Trulicity , Jardiance , and glipizide .  - Continue Trulicity , Jardiance , and glipizide .  Follow-up Follow-up based on lab and imaging results. Communication via phone call.(encourage to set up my chart) - Review lab and imaging results once available. - Communicate results and any necessary treatment changes. - Determine follow-up date based on results.

## 2024-06-06 NOTE — Addendum Note (Signed)
 Addended by: DORINA DALLAS HERO on: 06/06/2024 06:15 PM   Modules accepted: Orders

## 2024-06-06 NOTE — Patient Instructions (Addendum)
 Right foot and ankle swelling and pain(with popliteal pain) Acute swelling and pain in the right foot and ankle with differential diagnosis including early skin infection, gout or DVT. Neuropathy present but not primary cause. - Order CBC for infection assessment.(stat) - Order uric acid level for gout evaluation.(stat) - Order lower extremity Doppler ultrasound to rule out DVT.(stat) - Order x-ray of the foot and ankle.(stat) - Advise ice and elevation. - Recommend Tylenol  for pain management. -may advise additional med but need to assess above stat studies.  Diabetes mellitus Elevated blood sugar at 255 mg/dL. On Trulicity , Jardiance , and glipizide .  - Continue Trulicity , Jardiance , and glipizide .  Follow-up Follow-up based on lab and imaging results. Communication via phone call.(encourage to set up my chart) - Review lab and imaging results once available. - Communicate results and any necessary treatment changes. - Determine follow-up date based on results.  Gout  Gout is painful swelling of your joints. Gout is a type of arthritis. It is caused by having too much uric acid in your body. Uric acid is a chemical that is made when your body breaks down substances called purines. If your body has too much uric acid, sharp crystals can form and build up in your joints. This causes pain and swelling. Gout attacks can happen quickly and be very painful (acute gout). Over time, the attacks can affect more joints and happen more often (chronic gout). What are the causes? Gout is caused by too much uric acid in your blood. This can happen because: Your kidneys do not remove enough uric acid from your blood. Your body makes too much uric acid. You eat too many foods that are high in purines. These foods include organ meats, some seafood, and beer. Trauma or stress can bring on an attack. What increases the risk? Having a family history of gout. Being female and middle-aged. Being female and  having gone through menopause. Having an organ transplant. Taking certain medicines. Having certain conditions, such as: Being very overweight (obese). Lead poisoning. Kidney disease. A skin condition called psoriasis. Other risks include: Losing weight too quickly. Not having enough water in the body (being dehydrated). Drinking alcohol , especially beer. Drinking beverages that are sweetened with a type of sugar called fructose. What are the signs or symptoms? An attack of acute gout often starts at night and usually happens in just one joint. The most common place is the big toe. Other joints that may be affected include joints of the feet, ankle, knee, fingers, wrist, or elbow. Symptoms may include: Very bad pain. Warmth. Swelling. Stiffness. Tenderness. The affected joint may be very painful to touch. Shiny, red, or purple skin. Chills and fever. Chronic gout may cause symptoms more often. More joints may be involved. You may also have white or yellow lumps (tophi) on your hands or feet or in other areas near your joints. How is this treated? Treatment for an acute attack may include medicines for pain and swelling, such as: NSAIDs, such as ibuprofen. Steroids taken by mouth or injected into a joint. Colchicine. This can be given by mouth or through an IV tube. Treatment to prevent future attacks may include: Taking small doses of NSAIDs or colchicine daily. Using a medicine that reduces uric acid levels in your blood, such as allopurinol . Making changes to your diet. You may need to see a food expert (dietitian) about what to eat and drink to prevent gout. Follow these instructions at home: During a gout attack  If told,  put ice on the painful area. To do this: Put ice in a plastic bag. Place a towel between your skin and the bag. Leave the ice on for 20 minutes, 2-3 times a day. Take off the ice if your skin turns bright red. This is very important. If you cannot feel  pain, heat, or cold, you have a greater risk of damage to the area. Raise the painful joint above the level of your heart as often as you can. Rest the joint as much as possible. If the joint is in your leg, you may be given crutches. Follow instructions from your doctor about what you cannot eat or drink. Avoiding future gout attacks Eat a low-purine diet. Avoid foods and drinks such as: Liver. Kidney. Anchovies. Asparagus. Herring. Mushrooms. Mussels. Beer. Stay at a healthy weight. If you want to lose weight, talk with your doctor. Do not lose weight too fast. Start or continue an exercise plan as told by your doctor. Eating and drinking Avoid drinks sweetened by fructose. Drink enough fluids to keep your pee (urine) pale yellow. If you drink alcohol : Limit how much you have to: 0-1 drink a day for women who are not pregnant. 0-2 drinks a day for men. Know how much alcohol  is in a drink. In the [place] one drink equals one 12 oz bottle of beer (355 mL), one 5 oz glass of wine (148 mL), or one 1 oz glass of hard liquor (44 mL). General instructions Take over-the-counter and prescription medicines only as told by your doctor. Ask your doctor if you should avoid driving or using machines while you are taking your medicine. Return to your normal activities when your doctor says that it is safe. Keep all follow-up visits. Where to find more information [employer]: www.niams.http://www.myers.net/ Contact a doctor if: You have another gout attack. You still have symptoms of a gout attack after 10 days of treatment. You have problems (side effects) because of your medicines. You have chills or a fever. You have burning pain when you pee (urinate). You have pain in your lower back or belly. Get help right away if: You have very bad pain. Your pain cannot be controlled. You cannot pee. Summary Gout is painful swelling of the joints. The most common site of pain is the big  toe, but it can affect other joints. Medicines and avoiding some foods can help to prevent and treat gout attacks. This information is not intended to replace advice given to you by your health care provider. Make sure you discuss any questions you have with your health care provider. Document Revised: 08/20/2021 Document Reviewed: 08/20/2021 Elsevier Patient Education  2024 [employer].

## 2024-06-08 ENCOUNTER — Other Ambulatory Visit: Payer: Self-pay

## 2024-06-08 NOTE — Progress Notes (Signed)
 Specialty Pharmacy Refill Coordination Note  Donna Fuller is a 83 y.o. female assessed today regarding refills of clinic administered specialty medication(s) Vutrisiran  Sodium (Amvuttra )   Clinic requested Courier to Provider Office   Delivery date: 06/19/24   Verified address: [facility] [address] [place] [address]   Medication will be filled on 06/16/24.

## 2024-06-14 ENCOUNTER — Other Ambulatory Visit: Payer: Self-pay | Admitting: Cardiology

## 2024-06-16 ENCOUNTER — Other Ambulatory Visit: Payer: Self-pay

## 2024-06-19 ENCOUNTER — Other Ambulatory Visit (HOSPITAL_COMMUNITY): Payer: Self-pay

## 2024-06-19 NOTE — Progress Notes (Unsigned)
 Advanced Heart Failure Clinic Note  PCP: Dorina Loving, PA-C PCP-Cardiologist: Evalene Lunger, MD HF-Cardiologist: Ria Commander, DO  HPI:  Donna Fuller is a 83 y.o. 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   Her cardiac history dates back to at least 2022 when she presented to [facility] with complaints of substernal chest pressure.  Patient underwent left heart catheterization that demonstrated only mild nonobstructive CAD of the LAD.  Echocardiogram with moderate to severe LVH and grade 2 diastolic dysfunction with LV ejection fraction of 60 to 65%.  Since that time patient reports she has had a continued decline in her functional status.  She was admitted to [facility] in July and December 2024 with acute on chronic HFpEF exacerbations.  In December 2024 she was diuresed with IV Lasix  and discharged home on torsemide  10 mg daily.  She reports having bilateral carpal tunnel syndrome, peripheral neuropathy and low back pain.     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    Seen by Dr. Commander on 12/15/23. Patient was doing much better after increasing diuretics, able to ambulate and complete ADLs easier. Carvedilol  was decreased to 3.125 mg BID  Seen by Dr. Commander on 01/12/24. Was hypervolemic and bumetanide  was increased to 2mg  BID.  PYP scan 01/2024 showed grade III uptake suggestive of cardiac amyloid.  Seen by Dr. Commander on 02/21/24. Bumex  decreased.  Today Donna Fuller returns to Heart Failure Clinic for pharmacist medication administration. Reports feeling well and significantly improved from months prior. She ambulated into clinic today with a cane, whereas, she previously used a wheel chair. Denies shortness of breath, fatigue, orthopnea, and edema. Reports improvement in peripheral neuropathy as well. She is able to complete all activities  of daily living (ADLs). Is somewhat active throughout the day. Weight at home is ~175 pounds. Takes bumetanide  1 mg daily. Appetite is improved. Somewhat follows a low sodium diet.  Current Heart Failure Medications: Loop diuretic: bumetanide  2 mg daily Beta-Blocker: none ACEI/ARB/ARNI: losartan  25 mg daily MRA: none SGLT2i: Jardiance  10 mg daily Other: Amvuttra  25 mg SQ q3mo  Has the patient been experiencing any side effects to the medications prescribed? No  Does the patient have any problems obtaining medications due to transportation or finances? No  Understanding of regimen: Fair  Understanding of indications: Fair  Potential of adherence: Fair  Patient understands to avoid NSAIDs.  Patient understands to avoid decongestants.  Pertinent Lab Values: Creat  Date Value Ref Range Status  06/18/2023 1.90 (H) 0.60 - 0.95 mg/dL Final   Creatinine, Ser  Date Value Ref Range Status  05/31/2024 1.71 (H) 0.44 - 1.00 mg/dL Final   BUN  Date Value Ref Range Status  05/31/2024 29 (H) 8 - 23 mg/dL Final  96/75/7974 36 (H) 8 - 27 mg/dL Final   Potassium  Date Value Ref Range Status  05/31/2024 3.6 3.5 - 5.1 mmol/L Final   Sodium  Date Value Ref Range Status  05/31/2024 138 135 - 145 mmol/L Final  02/21/2024 140 134 - 144 mmol/L Final   Brain Natriuretic Peptide  Date Value Ref Range Status  06/18/2023 364 (H) <100 pg/mL Final    Comment:    . BNP levels increase with age in the general population with the highest values seen in individuals greater than 52 years of age. Reference: J. Am. Penne. Cardiol. 2002; 59:023-017. .    B Natriuretic Peptide  Date Value Ref Range Status  05/31/2024 441.8 (H) 0.0 - 100.0 pg/mL Final    Comment:    Performed at [facility], [address] [place], [place] [address]   Magnesium  Date Value Ref Range Status  11/21/2021 2.2 1.7 - 2.4 mg/dL Final    Comment:    Performed at [facility], [address]  [place] [place], [place] [address]   TSH  Date Value Ref Range Status  06/18/2023 1.69 0.40 - 4.50 mIU/L Final    Vital Signs: There were no vitals filed for this visit.   Assessment/Plan: Evauation for cardiac amyloid  - TTE with restrictive features; severe LVH and myocardium that is secondary to cardiac amyloidosis.  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  Continue Bumex  2 mg daily  -MD considering tafamidis since PYP scan was positive, however, doing well currently on Amvuttra  monotherapy. -Given Amvuttra  today for polyneuropathy and cardiac amyloid. Educated and provided with handout.    2. HFpEF, restrictive heart disease -Secondary to cardiac amyloid - Will continue Bumex  2 mg daily for now.  - MD plans to start finerenone at follow up. Prior authorization completed and approved. UACR 116 on 08/18/23. Copay is $0 - Continue Jardiance  10 mg daily   3. Peripheral neuropathy - Reports mild sensory deficits in her fingers with carpal tunnel like symptoms.  - Her A1C has ranged from 6 to 6.8 since 2022. - Her polyneuropathy is secondary to cardiac amyloid. Second injection of Amvuttra  administered in clinic today [facility] [id number], LOT [id number], Exp Dec 2026). Tolerated the injection well. Next injection in 3 months.   4. T2DM - A1C from 6.8 to 6 ranging from 2022 to 2024 - Continue Trulicity . If Trulicity  is titrated, may need to decrease glipizide . - Continue Jardiance  10 mg daily. Refill sent in.    5. Hypertension  - Continue losartan  50 mg daily   6. CKD -Repeat labs today.  Follow up: With Pharmacy in 3 months for next injection. With Dr. Gardenia in July.  Please do not hesitate to reach out with questions or concerns,  Jaun Bash, PharmD, CPP, BCPS Heart Failure Pharmacist  Phone - [phone] 06/19/2024 11:26 AM

## 2024-06-20 ENCOUNTER — Telehealth: Payer: Self-pay | Admitting: Pharmacist

## 2024-06-20 NOTE — Telephone Encounter (Signed)
 Called to confirm/remind patient of their appointment at the Advanced Heart Failure Clinic on 06/21/24.   Appointment:   [] Confirmed  [x] Left mess   [] No answer/No voice mail  [] VM Full/unable to leave message  [] Phone not in service  Patient reminded to bring all medications and/or complete list.  Confirmed patient has transportation. Gave directions, instructed to utilize valet parking.

## 2024-06-21 ENCOUNTER — Ambulatory Visit: Attending: Cardiology | Admitting: Pharmacist

## 2024-06-21 VITALS — BP 128/68 | HR 83 | Wt 181.8 lb

## 2024-06-21 DIAGNOSIS — I5032 Chronic diastolic (congestive) heart failure: Secondary | ICD-10-CM | POA: Diagnosis not present

## 2024-06-21 DIAGNOSIS — I130 Hypertensive heart and chronic kidney disease with heart failure and stage 1 through stage 4 chronic kidney disease, or unspecified chronic kidney disease: Secondary | ICD-10-CM | POA: Diagnosis not present

## 2024-06-21 DIAGNOSIS — E854 Organ-limited amyloidosis: Secondary | ICD-10-CM | POA: Diagnosis not present

## 2024-06-21 DIAGNOSIS — Z7985 Long-term (current) use of injectable non-insulin antidiabetic drugs: Secondary | ICD-10-CM | POA: Insufficient documentation

## 2024-06-21 DIAGNOSIS — Z79899 Other long term (current) drug therapy: Secondary | ICD-10-CM | POA: Diagnosis not present

## 2024-06-21 DIAGNOSIS — N189 Chronic kidney disease, unspecified: Secondary | ICD-10-CM | POA: Diagnosis not present

## 2024-06-21 DIAGNOSIS — I43 Cardiomyopathy in diseases classified elsewhere: Secondary | ICD-10-CM | POA: Insufficient documentation

## 2024-06-21 DIAGNOSIS — E1142 Type 2 diabetes mellitus with diabetic polyneuropathy: Secondary | ICD-10-CM | POA: Insufficient documentation

## 2024-06-21 DIAGNOSIS — E1122 Type 2 diabetes mellitus with diabetic chronic kidney disease: Secondary | ICD-10-CM | POA: Insufficient documentation

## 2024-06-21 MED ORDER — VUTRISIRAN SODIUM 25 MG/0.5ML ~~LOC~~ SOSY
25.0000 mg | PREFILLED_SYRINGE | Freq: Once | SUBCUTANEOUS | Status: AC
  Administered 2024-06-21: 25 mg via SUBCUTANEOUS

## 2024-06-21 NOTE — Patient Instructions (Signed)
 It was a pleasure seeing you today!  MEDICATIONS: -No changes today -Call if you have questions about your medications.  LABS: -We will call you if your labs need attention.  NEXT APPOINTMENT: Return to clinic in 3 months for your next injection.  In general, to take care of your heart failure: -Limit your fluid intake to 2 Liters (half-gallon) per day.   -Limit your salt intake to ideally 2-3 grams (2000-3000 mg) per day. -Weigh yourself daily and record, and bring that weight diary to your next appointment.  (Weight gain of 2-3 pounds in 1 day typically means fluid weight.) -The medications for your heart are to help your heart and help you live longer.   -Please contact us  before stopping any of your heart medications.  Call the clinic at [phone] with questions or to reschedule future appointments.

## 2024-06-22 ENCOUNTER — Other Ambulatory Visit

## 2024-06-27 ENCOUNTER — Ambulatory Visit: Payer: Self-pay

## 2024-06-27 ENCOUNTER — Telehealth: Payer: Self-pay

## 2024-06-27 ENCOUNTER — Other Ambulatory Visit (HOSPITAL_COMMUNITY): Payer: Self-pay

## 2024-06-27 ENCOUNTER — Encounter: Payer: Self-pay | Admitting: Family Medicine

## 2024-06-27 ENCOUNTER — Ambulatory Visit (INDEPENDENT_AMBULATORY_CARE_PROVIDER_SITE_OTHER): Admitting: Family Medicine

## 2024-06-27 VITALS — BP 120/70 | HR 86 | Temp 98.0°F | Resp 16 | Ht 61.0 in | Wt 177.6 lb

## 2024-06-27 DIAGNOSIS — K2900 Acute gastritis without bleeding: Secondary | ICD-10-CM

## 2024-06-27 DIAGNOSIS — R1013 Epigastric pain: Secondary | ICD-10-CM | POA: Diagnosis not present

## 2024-06-27 MED ORDER — DICYCLOMINE HCL 10 MG PO CAPS: ORAL_CAPSULE | ORAL | 0 refills | Status: DC

## 2024-06-27 MED ORDER — ONDANSETRON 4 MG PO TBDP: 4.0000 mg | ORAL_TABLET | Freq: Three times a day (TID) | ORAL | 0 refills | Status: DC | PRN

## 2024-06-27 NOTE — Progress Notes (Signed)
 Chief Complaint  Patient presents with   Abdominal Pain    Abdominal Pain and Ear Fullness     Subjective Donna Fuller is a 84 y.o. female who presents with epigastric abd pain.  Defined as cramping.  Symptoms began 6 d ago.  Patient has decreased appetite, nausea Patient denies new medications/supplements, vomiting, diarrhea, fever, myalgias, bleeding, and URI symptoms Treatment to date: none Sick contacts: none known  Past Medical History:  Diagnosis Date   Arthritis    Breast cancer (HCC) 2007   Right breast, s/p mastectomy   Chronic diastolic congestive heart failure (HCC)    per patient, dx by cardiology   Diabetes mellitus without complication (HCC)    GERD (gastroesophageal reflux disease)    Gout    Hyperlipidemia    Hypertension    Spinal stenosis     Exam BP 120/70 (BP Location: Left Arm, Patient Position: Sitting)   Pulse 86   Temp 98 F (36.7 C) (Oral)   Resp 16   Ht 5' 1 (1.549 m)   Wt 177 lb 9.6 oz (80.6 kg)   SpO2 96%   BMI 33.56 kg/m  General:  well developed, well hydrated, in no apparent distress Skin:  warm, no pallor or diaphoresis, no rashes Throat/Pharynx:  lips and gingiva without lesion; tongue and uvula midline; non-inflamed pharynx; no exudates or postnasal drainage Lungs:  clear to auscultation, breath sounds equal bilaterally, no respiratory distress, no wheezes Cardio:  RRR Abdomen:  abdomen soft, epigastric TTP; bowel sounds normal; no masses or organomegaly Psych: Appropriate judgement/insight  Assessment and Plan  Acute gastritis, presence of bleeding unspecified, unspecified gastritis type - Plan: ondansetron  (ZOFRAN -ODT) 4 MG disintegrating tablet, dicyclomine  (BENTYL ) 10 MG capsule  Epigastric abdominal pain - Plan: CBC, Comprehensive metabolic panel with GFR, Lipase  Bentyl  and Zofran  as needed.  Hopefully she can hydrate, she will start to feel better.  If no improvement in next couple days, she will let me know and we  will consider getting a CT of her abdomen. Avoid aggravating foods, discussed advancing diet. F/u if symptoms fail to improve, sooner if worsening. The patient voiced understanding and agreement to the plan.  Mabel Mt Anderson, DO 06/27/24  4:57 PM

## 2024-06-27 NOTE — Patient Instructions (Signed)
 If things get worse, seek immediate care.  Give us  2-3 business days to get the results of your labs back.   Stay hydrated.  As long as you are vomiting or having diarrhea, please drink fluids with electrolytes like Gatorade, Powerade, or Pedialyte (if you can stomach the taste). Drink these along with water mainly.   If not obviously better by Th, please send me a message. We will be getting a scan.  Let us  know if you need anything.

## 2024-06-27 NOTE — Telephone Encounter (Signed)
 Pharmacy Patient Advocate Encounter   Received notification from CoverMyMeds that prior authorization for Ondansetron  4MG  dispersible tablets is required/requested.   Insurance verification completed.   The patient is insured through [facility] ADVANTAGE/RX ADVANCE .   Per test claim: PA required; PA submitted to above mentioned insurance via Phone Key/confirmation #/[id number] Status is pending  Phone: [phone]

## 2024-06-27 NOTE — Telephone Encounter (Signed)
 FYI Only or Action Required?: FYI only for provider.  Patient was last seen in primary care on 06/06/2024 by Dorina Loving, PA-C.  Called Nurse Triage reporting Abdominal Pain.  Symptoms began several days ago.  Interventions attempted: Rest, hydration, or home remedies.  Symptoms are: gradually worsening.  Triage Disposition: See Physician Within 24 Hours  Patient/caregiver understands and will follow disposition?: Yes   Copied from CRM #[id number]. Topic: Clinical - Red Word Triage >> Jun 27, 2024  8:14 AM Gennette ORN wrote: Red Word that prompted transfer to Nurse Triage: Patient is having severe stomach pains , she was nausea last Wednesday this is when everything started now she is just having the stomach pains still. Reason for Disposition  [1] MODERATE pain (e.g., interferes with normal activities) AND [2] pain comes and goes (cramps) AND [3] present > 24 hours  (Exception: Pain with Vomiting or Diarrhea - see that Guideline.)  Answer Assessment - Initial Assessment Questions 1. LOCATION: Where does it hurt?      Central to the left side of the abdomen  2. RADIATION: Does the pain shoot anywhere else? (e.g., chest, back)     Back  3. ONSET: When did the pain begin? (e.g., minutes, hours or days ago)      Last Wednesday  4. SUDDEN: Gradual or sudden onset?     Sudden  5. PATTERN Does the pain come and go, or is it constant?     Comes and Goes  6. SEVERITY: How bad is the pain?  (e.g., Scale 1-10; mild, moderate, or severe)     Varies  7. RECURRENT SYMPTOM: Have you ever had this type of stomach pain before? If Yes, ask: When was the last time? and What happened that time?      No  8. CAUSE: What do you think is causing the stomach pain? (e.g., gallstones, recent abdominal surgery)     Unsure  9. RELIEVING/AGGRAVATING FACTORS: What makes it better or worse? (e.g., antacids, bending or twisting motion, bowel movement)    Lying down provides  relief      10. OTHER SYMPTOMS: Do you have any other symptoms? (e.g., back pain, diarrhea, fever, urination pain, vomiting)       Nausea, Constipation  11. PREGNANCY: Is there any chance you are pregnant? When was your last menstrual period?       No and No  Patient also reports elevated blood glucose levels on yesterday, up to 300.  Protocols used: Abdominal Pain - Female-A-AH

## 2024-06-28 ENCOUNTER — Other Ambulatory Visit (HOSPITAL_COMMUNITY): Payer: Self-pay

## 2024-06-28 ENCOUNTER — Ambulatory Visit: Payer: Self-pay | Admitting: Family Medicine

## 2024-06-28 LAB — COMPREHENSIVE METABOLIC PANEL WITH GFR
ALT: 12 U/L (ref 0–35)
AST: 17 U/L (ref 0–37)
Albumin: 4.3 g/dL (ref 3.5–5.2)
Alkaline Phosphatase: 145 U/L — ABNORMAL HIGH (ref 39–117)
BUN: 23 mg/dL (ref 6–23)
CO2: 33 meq/L — ABNORMAL HIGH (ref 19–32)
Calcium: 9.8 mg/dL (ref 8.4–10.5)
Chloride: 97 meq/L (ref 96–112)
Creatinine, Ser: 1.39 mg/dL — ABNORMAL HIGH (ref 0.40–1.20)
GFR: 35.34 mL/min — ABNORMAL LOW (ref >60.00)
Glucose, Bld: 151 mg/dL — ABNORMAL HIGH (ref 70–99)
Potassium: 3.3 meq/L — ABNORMAL LOW (ref 3.5–5.1)
Sodium: 141 meq/L (ref 135–145)
Total Bilirubin: 0.7 mg/dL (ref 0.2–1.2)
Total Protein: 7.4 g/dL (ref 6.0–8.3)

## 2024-06-28 LAB — CBC
HCT: 43.4 % (ref 36.0–46.0)
Hemoglobin: 14.4 g/dL (ref 12.0–15.0)
MCHC: 33.1 g/dL (ref 30.0–36.0)
MCV: 93.5 fl (ref 78.0–100.0)
Platelets: 167 K/uL (ref 150.0–400.0)
RBC: 4.65 Mil/uL (ref 3.87–5.11)
RDW: 15.1 % (ref 11.5–15.5)
WBC: 9.4 K/uL (ref 4.0–10.5)

## 2024-06-28 LAB — LIPASE: Lipase: 196 U/L — ABNORMAL HIGH (ref 11.0–59.0)

## 2024-06-28 NOTE — Telephone Encounter (Signed)
 Pharmacy Patient Advocate Encounter  Received notification from [facility] ADVANTAGE/RX ADVANCE that Prior Authorization for Ondansetron  4mg  ODT tabs has been APPROVED from 06/27/24 to 11/29/24. Unable to obtain price due to refill too soon rejection, last fill date 06/27/24 next available fill date8/3/25   PA #/Case ID/Reference #: [id number]  Approval letter indexed to media tab

## 2024-06-29 ENCOUNTER — Emergency Department

## 2024-06-29 ENCOUNTER — Encounter: Payer: Self-pay | Admitting: Emergency Medicine

## 2024-06-29 ENCOUNTER — Other Ambulatory Visit: Payer: Self-pay

## 2024-06-29 ENCOUNTER — Ambulatory Visit: Payer: Self-pay

## 2024-06-29 ENCOUNTER — Inpatient Hospital Stay
Admission: EM | Admit: 2024-06-29 | Discharge: 2024-07-01 | DRG: 439 | Disposition: A | Discharge status: Home / Self Care | Attending: Internal Medicine | Admitting: Internal Medicine

## 2024-06-29 DIAGNOSIS — I2510 Atherosclerotic heart disease of native coronary artery without angina pectoris: Secondary | ICD-10-CM | POA: Diagnosis present

## 2024-06-29 DIAGNOSIS — R1013 Epigastric pain: Secondary | ICD-10-CM | POA: Diagnosis not present

## 2024-06-29 DIAGNOSIS — Z66 Do not resuscitate: Secondary | ICD-10-CM | POA: Diagnosis present

## 2024-06-29 DIAGNOSIS — E119 Type 2 diabetes mellitus without complications: Secondary | ICD-10-CM

## 2024-06-29 DIAGNOSIS — E11319 Type 2 diabetes mellitus with unspecified diabetic retinopathy without macular edema: Secondary | ICD-10-CM | POA: Diagnosis present

## 2024-06-29 DIAGNOSIS — K219 Gastro-esophageal reflux disease without esophagitis: Secondary | ICD-10-CM | POA: Diagnosis not present

## 2024-06-29 DIAGNOSIS — E1122 Type 2 diabetes mellitus with diabetic chronic kidney disease: Secondary | ICD-10-CM | POA: Diagnosis present

## 2024-06-29 DIAGNOSIS — Z7982 Long term (current) use of aspirin: Secondary | ICD-10-CM

## 2024-06-29 DIAGNOSIS — N1832 Chronic kidney disease, stage 3b: Secondary | ICD-10-CM | POA: Diagnosis present

## 2024-06-29 DIAGNOSIS — E66811 Obesity, class 1: Secondary | ICD-10-CM | POA: Diagnosis present

## 2024-06-29 DIAGNOSIS — Z880 Allergy status to penicillin: Secondary | ICD-10-CM

## 2024-06-29 DIAGNOSIS — Z79899 Other long term (current) drug therapy: Secondary | ICD-10-CM

## 2024-06-29 DIAGNOSIS — Z820 Family history of epilepsy and other diseases of the nervous system: Secondary | ICD-10-CM

## 2024-06-29 DIAGNOSIS — R9431 Abnormal electrocardiogram [ECG] [EKG]: Secondary | ICD-10-CM | POA: Diagnosis not present

## 2024-06-29 DIAGNOSIS — Z7984 Long term (current) use of oral hypoglycemic drugs: Secondary | ICD-10-CM

## 2024-06-29 DIAGNOSIS — Z83438 Family history of other disorder of lipoprotein metabolism and other lipidemia: Secondary | ICD-10-CM

## 2024-06-29 DIAGNOSIS — Z801 Family history of malignant neoplasm of trachea, bronchus and lung: Secondary | ICD-10-CM

## 2024-06-29 DIAGNOSIS — I10 Essential (primary) hypertension: Secondary | ICD-10-CM | POA: Diagnosis present

## 2024-06-29 DIAGNOSIS — R7989 Other specified abnormal findings of blood chemistry: Secondary | ICD-10-CM | POA: Diagnosis not present

## 2024-06-29 DIAGNOSIS — Z9011 Acquired absence of right breast and nipple: Secondary | ICD-10-CM

## 2024-06-29 DIAGNOSIS — Z90710 Acquired absence of both cervix and uterus: Secondary | ICD-10-CM

## 2024-06-29 DIAGNOSIS — E1142 Type 2 diabetes mellitus with diabetic polyneuropathy: Secondary | ICD-10-CM | POA: Diagnosis present

## 2024-06-29 DIAGNOSIS — Z87891 Personal history of nicotine dependence: Secondary | ICD-10-CM

## 2024-06-29 DIAGNOSIS — Z853 Personal history of malignant neoplasm of breast: Secondary | ICD-10-CM

## 2024-06-29 DIAGNOSIS — Z833 Family history of diabetes mellitus: Secondary | ICD-10-CM

## 2024-06-29 DIAGNOSIS — R1033 Periumbilical pain: Secondary | ICD-10-CM | POA: Diagnosis not present

## 2024-06-29 DIAGNOSIS — Z8249 Family history of ischemic heart disease and other diseases of the circulatory system: Secondary | ICD-10-CM

## 2024-06-29 DIAGNOSIS — K859 Acute pancreatitis without necrosis or infection, unspecified: Principal | ICD-10-CM | POA: Diagnosis present

## 2024-06-29 DIAGNOSIS — I130 Hypertensive heart and chronic kidney disease with heart failure and stage 1 through stage 4 chronic kidney disease, or unspecified chronic kidney disease: Secondary | ICD-10-CM | POA: Diagnosis present

## 2024-06-29 DIAGNOSIS — I440 Atrioventricular block, first degree: Secondary | ICD-10-CM | POA: Diagnosis present

## 2024-06-29 DIAGNOSIS — Z96653 Presence of artificial knee joint, bilateral: Secondary | ICD-10-CM | POA: Diagnosis present

## 2024-06-29 DIAGNOSIS — I5032 Chronic diastolic (congestive) heart failure: Secondary | ICD-10-CM | POA: Diagnosis present

## 2024-06-29 DIAGNOSIS — Z882 Allergy status to sulfonamides status: Secondary | ICD-10-CM

## 2024-06-29 DIAGNOSIS — Z7985 Long-term (current) use of injectable non-insulin antidiabetic drugs: Secondary | ICD-10-CM

## 2024-06-29 DIAGNOSIS — Z794 Long term (current) use of insulin: Secondary | ICD-10-CM | POA: Diagnosis not present

## 2024-06-29 DIAGNOSIS — Z888 Allergy status to other drugs, medicaments and biological substances status: Secondary | ICD-10-CM

## 2024-06-29 DIAGNOSIS — Z841 Family history of disorders of kidney and ureter: Secondary | ICD-10-CM

## 2024-06-29 DIAGNOSIS — E785 Hyperlipidemia, unspecified: Secondary | ICD-10-CM | POA: Diagnosis present

## 2024-06-29 LAB — COMPREHENSIVE METABOLIC PANEL WITH GFR
ALT: 11 U/L (ref 0–44)
AST: 22 U/L (ref 15–41)
Albumin: 3.6 g/dL (ref 3.5–5.0)
Alkaline Phosphatase: 123 U/L (ref 38–126)
Anion gap: 19 — ABNORMAL HIGH (ref 5–15)
BUN: 25 mg/dL — ABNORMAL HIGH (ref 8–23)
CO2: 25 mmol/L (ref 22–32)
Calcium: 9.7 mg/dL (ref 8.9–10.3)
Chloride: 98 mmol/L (ref 98–111)
Creatinine, Ser: 1.41 mg/dL — ABNORMAL HIGH (ref 0.44–1.00)
GFR, Estimated: 37 mL/min — ABNORMAL LOW (ref >60)
Glucose, Bld: 114 mg/dL — ABNORMAL HIGH (ref 70–99)
Potassium: 3.5 mmol/L (ref 3.5–5.1)
Sodium: 142 mmol/L (ref 135–145)
Total Bilirubin: 1.5 mg/dL — ABNORMAL HIGH (ref 0.0–1.2)
Total Protein: 7.2 g/dL (ref 6.5–8.1)

## 2024-06-29 LAB — URINALYSIS, ROUTINE W REFLEX MICROSCOPIC
Bacteria, UA: NONE SEEN
Bilirubin Urine: NEGATIVE
Glucose, UA: >=500 mg/dL — AB
Hgb urine dipstick: NEGATIVE
Ketones, ur: NEGATIVE mg/dL
Leukocytes,Ua: NEGATIVE
Nitrite: NEGATIVE
Protein, ur: NEGATIVE mg/dL
RBC / HPF: 0 RBC/hpf (ref 0–5)
Specific Gravity, Urine: 1.023 (ref 1.005–1.030)
pH: 6 (ref 5.0–8.0)

## 2024-06-29 LAB — TROPONIN I (HIGH SENSITIVITY)
Troponin I (High Sensitivity): 127 ng/L (ref <18)
Troponin I (High Sensitivity): 139 ng/L (ref <18)

## 2024-06-29 LAB — TRIGLYCERIDES: Triglycerides: 133 mg/dL (ref <150)

## 2024-06-29 LAB — CBC
HCT: 43.7 % (ref 36.0–46.0)
Hemoglobin: 14.1 g/dL (ref 12.0–15.0)
MCH: 30.3 pg (ref 26.0–34.0)
MCHC: 32.3 g/dL (ref 30.0–36.0)
MCV: 93.8 fL (ref 80.0–100.0)
Platelets: 177 K/uL (ref 150–400)
RBC: 4.66 MIL/uL (ref 3.87–5.11)
RDW: 14.4 % (ref 11.5–15.5)
WBC: 8.2 K/uL (ref 4.0–10.5)
nRBC: 0 % (ref 0.0–0.2)

## 2024-06-29 LAB — LIPASE, BLOOD: Lipase: 155 U/L — ABNORMAL HIGH (ref 11–51)

## 2024-06-29 LAB — PHOSPHORUS: Phosphorus: 3.4 mg/dL (ref 2.5–4.6)

## 2024-06-29 LAB — GLUCOSE, CAPILLARY: Glucose-Capillary: 162 mg/dL — ABNORMAL HIGH (ref 70–99)

## 2024-06-29 LAB — MAGNESIUM: Magnesium: 2.3 mg/dL (ref 1.7–2.4)

## 2024-06-29 MED ORDER — HYDROMORPHONE HCL 1 MG/ML IJ SOLN
0.5000 mg | INTRAMUSCULAR | Status: DC | PRN
  Administered 2024-06-29 – 2024-07-01 (×4): 0.5 mg via INTRAVENOUS
  Filled 2024-06-29 (×4): qty 0.5

## 2024-06-29 MED ORDER — ACETAMINOPHEN 325 MG RE SUPP
650.0000 mg | Freq: Four times a day (QID) | RECTAL | Status: DC | PRN
  Filled 2024-06-29: qty 2

## 2024-06-29 MED ORDER — SODIUM CHLORIDE 0.9 % IV BOLUS
1000.0000 mL | Freq: Once | INTRAVENOUS | Status: AC
  Administered 2024-06-29: 1000 mL via INTRAVENOUS

## 2024-06-29 MED ORDER — ONDANSETRON HCL 4 MG/2ML IJ SOLN
4.0000 mg | Freq: Once | INTRAMUSCULAR | Status: AC
  Administered 2024-06-29: 4 mg via INTRAVENOUS
  Filled 2024-06-29: qty 2

## 2024-06-29 MED ORDER — ENOXAPARIN SODIUM 40 MG/0.4ML IJ SOSY
40.0000 mg | PREFILLED_SYRINGE | INTRAMUSCULAR | Status: DC
  Administered 2024-06-29 – 2024-06-30 (×2): 40 mg via SUBCUTANEOUS
  Filled 2024-06-29 (×2): qty 0.4

## 2024-06-29 MED ORDER — ONDANSETRON HCL 4 MG/2ML IJ SOLN
4.0000 mg | Freq: Four times a day (QID) | INTRAMUSCULAR | Status: DC | PRN
  Administered 2024-06-30 (×2): 4 mg via INTRAVENOUS
  Filled 2024-06-29 (×2): qty 2

## 2024-06-29 MED ORDER — SODIUM CHLORIDE 0.9 % IV SOLN: INTRAVENOUS | Status: DC

## 2024-06-29 MED ORDER — PROCHLORPERAZINE EDISYLATE 10 MG/2ML IJ SOLN
7.5000 mg | Freq: Four times a day (QID) | INTRAMUSCULAR | Status: DC | PRN
  Filled 2024-06-29: qty 2

## 2024-06-29 MED ORDER — POTASSIUM CHLORIDE CRYS ER 20 MEQ PO TBCR
20.0000 meq | EXTENDED_RELEASE_TABLET | Freq: Once | ORAL | Status: AC
Start: 2024-06-29 — End: 2024-06-29
  Administered 2024-06-29: 20 meq via ORAL
  Filled 2024-06-29: qty 1

## 2024-06-29 MED ORDER — PANTOPRAZOLE SODIUM 40 MG IV SOLR
40.0000 mg | Freq: Once | INTRAVENOUS | Status: AC
  Administered 2024-06-29: 40 mg via INTRAVENOUS
  Filled 2024-06-29: qty 10

## 2024-06-29 MED ORDER — FENTANYL CITRATE PF 50 MCG/ML IJ SOSY
25.0000 ug | PREFILLED_SYRINGE | Freq: Once | INTRAMUSCULAR | Status: AC
  Administered 2024-06-29: 25 ug via INTRAVENOUS
  Filled 2024-06-29: qty 1

## 2024-06-29 MED ORDER — PANTOPRAZOLE SODIUM 40 MG IV SOLR
40.0000 mg | INTRAVENOUS | Status: DC
  Administered 2024-06-30: 40 mg via INTRAVENOUS
  Filled 2024-06-29 (×2): qty 10

## 2024-06-29 MED ORDER — IOHEXOL 300 MG/ML  SOLN
80.0000 mL | Freq: Once | INTRAMUSCULAR | Status: AC | PRN
  Administered 2024-06-29: 80 mL via INTRAVENOUS

## 2024-06-29 MED ORDER — ACETAMINOPHEN 325 MG PO TABS
650.0000 mg | ORAL_TABLET | Freq: Four times a day (QID) | ORAL | Status: DC | PRN
Start: 2024-06-29 — End: 2024-07-01
  Filled 2024-06-29: qty 2

## 2024-06-29 MED ORDER — MAGNESIUM SULFATE 2 GM/50ML IV SOLN
2.0000 g | Freq: Once | INTRAVENOUS | Status: AC
  Administered 2024-06-29: 2 g via INTRAVENOUS
  Filled 2024-06-29: qty 50

## 2024-06-29 MED ORDER — POTASSIUM CHLORIDE IN NACL 20-0.9 MEQ/L-% IV SOLN
INTRAVENOUS | Status: AC
  Filled 2024-06-29 (×3): qty 1000

## 2024-06-29 MED ORDER — ONDANSETRON HCL 4 MG PO TABS: 4.0000 mg | ORAL_TABLET | Freq: Four times a day (QID) | ORAL | Status: DC | PRN

## 2024-06-29 MED ORDER — INSULIN ASPART 100 UNIT/ML IJ SOLN
0.0000 [IU] | Freq: Three times a day (TID) | INTRAMUSCULAR | Status: DC
  Administered 2024-06-30: 2 [IU] via SUBCUTANEOUS
  Filled 2024-06-29 (×2): qty 1

## 2024-06-29 NOTE — H&P (Signed)
 History and Physical    Patient: Donna Fuller DOB: 1941-08-02 DOA: 06/29/2024 DOS: the patient was seen and examined on 06/29/2024 PCP: Dorina Loving, PA-C  Patient coming from: Home  Chief Complaint:  Chief Complaint  Patient presents with   Abdominal Pain   HPI: Donna Fuller is a 83 y.o. female with medical history significant of class I obesity, osteoarthritis, right breast cancer, status postmastectomy, chronic diastolic CHF, LVH, type 2 diabetes, GERD, gout, hyperlipidemia, hypertension, spinal stenosis, peripheral neuropathy, diabetic retinopathy, glaucoma who presented to the emergency department complaints of abdominal pain for the past 8 days after she ate fried chicken.  She has had on and off similar pains but not as intense for several months. No  emesis, diarrhea, melena or hematochezia. She has occasional constipation. No flank pain, dysuria, frequency or hematuria.She denied fever, chills, rhinorrhea, sore throat, wheezing or hemoptysis. No chest pain, palpitations, diaphoresis, PND, orthopnea or pitting edema of the lower extremities. No polyuria, polydipsia, polyphagia or blurred vision.   Lab work: CBC was normal.  Lipase 155 units/L.  CMP showed glucose 114, BUN 25, creatinine 1.41 and total bilirubin 1.5 mg/dL.  Electrolytes and the rest of the LFTs were normal.  Troponin 127 ng/L then  Imaging: CT abdomen/pelvis with contrast showing findings consistent with acute pancreatitis.  No definite pseudocyst.  Aortic atherosclerosis.  There was minimal bibasilar subsegmental atelectasis or scaring.   ED course: Initial vital signs were temperature 97.8 F, pulse 60, respiration 18, BP 132/75 mmHg O2 sat 98% on room air.  The patient received fentanyl  25 mcg IVP, ondansetron  4 mg IVP, pantoprazole  40 mg IVP and 1000 mL of normal saline bolus.  Review of Systems: As mentioned in the history of present illness. All other systems reviewed and are negative. Past  Medical History:  Diagnosis Date   Arthritis    Breast cancer [facility]) 2007   Right breast, s/p mastectomy   Chronic diastolic congestive heart failure (HCC)    per patient, dx by cardiology   Diabetes mellitus without complication (HCC)    GERD (gastroesophageal reflux disease)    Gout    Hyperlipidemia    Hypertension    Spinal stenosis    Past Surgical History:  Procedure Laterality Date   ABDOMINAL HYSTERECTOMY     ABDOMINAL SURGERY     BREAST BIOPSY Left    2016 benign    BREAST BIOPSY Left 02/16/2023   MM LT BREAST BX W LOC DEV 1ST LESION IMAGE BX SPEC STEREO GUIDE 02/16/2023 [facility]MO[facility]   LEFT HEART CATH AND CORONARY ANGIOGRAPHY N/A 11/21/2021   Procedure: LEFT HEART CATH AND CORONARY ANGIOGRAPHY;  Surgeon: Darron Deatrice LABOR, MD;  Location: [facility] INVASIVE CV LAB;  Service: Cardiovascular;  Laterality: N/A;   MASTECTOMY     right side   REPLACEMENT TOTAL KNEE Bilateral    TUBAL LIGATION     Social History:  reports that she has quit smoking. Her smoking use included cigarettes. She has never been exposed to tobacco smoke. She has never used smokeless tobacco. She reports that she does not drink alcohol  and does not use drugs.  Allergies  Allergen Reactions   Penicillins Swelling   Sulfa Antibiotics Swelling   Amlodipine Swelling    LE Swelling   Lisinopril Cough    Family History  Problem Relation Age of Onset   Diabetes Mother    Alzheimer's disease Mother    Hypertension Mother    Hypertension Father    Heart  disease Father    Hyperlipidemia Father    Kidney disease Father    Diabetes Sister    Cancer Brother        lung   Alzheimer's disease Brother    Heart disease Brother    Hypertension Brother    Diabetes Brother    Hypertension Brother    Diabetes Brother    Hyperlipidemia Son    Hypertension Son    Diabetes Son    Hypertension Daughter    Hyperlipidemia Daughter     Prior to Admission medications   Medication Sig Start Date End Date  Taking? Authorizing Provider  acetaminophen  (TYLENOL ) 650 MG CR tablet Take 650 mg by mouth 2 (two) times daily.    [provider]  albuterol  (VENTOLIN  HFA) 108 (90 Base) MCG/ACT inhaler INHALE 2 PUFFS BY MOUTH EVERY 6 HOURS AS NEEDED 11/09/22   Saguier, [place], PA-C  Alcohol  Swabs (ALCOHOL  PREP PAD) 70 % PADS Use to check blood sugars 09/01/23   Shamleffer, Ibtehal Jaralla, MD  ascorbic acid (VITAMIN C) 1000 MG tablet Take 1,000 mg by mouth daily.    [provider]  aspirin  EC 81 MG tablet Take 1 tablet (81 mg total) by mouth daily. Swallow whole. 09/02/23   Krasowski, Robert J, MD  Azelastine HCl 137 MCG/SPRAY SOLN SMARTSIG:1-2 Spray(s) Both Nares Twice Daily 02/09/24   [provider]  Benzocaine-Benzethonium [facility] EX) Apply 1 application  topically daily as needed. Roll-on daily as needed for knee pain.    [provider]  blood glucose meter kit and supplies KIT Dispense based on patient and insurance preference. Use up to four times daily as directed. (FOR ICD-9 250.00, 250.01). 07/09/17   Cook, Jayce G, DO  brimonidine  (ALPHAGAN ) 0.2 % ophthalmic solution Place 1 drop into both eyes 3 (three) times daily. 05/08/19   [provider]  bumetanide  (BUMEX ) 1 MG tablet Take 2 mg by mouth daily. Patient taking differently: Take 1 mg by mouth 2 (two) times daily.    [provider]  Cholecalciferol (VITAMIN D-3 PO) Take 2,000 Units by mouth daily.    [provider]  Cyanocobalamin  (B-12 PO) Take 1 tablet by mouth daily.    [provider]  dicyclomine  (BENTYL ) 10 MG capsule Take 1 tab every 6 hours as needed for abdominal cramping. 06/27/24   Frann, Mabel Mt, DO  Dulaglutide  (TRULICITY ) 1.5 MG/0.5ML SOAJ INJECT 1.5 MG (0.5ML) UNDER THE SKIN ONCE A WEEK 05/12/24   Shamleffer, Donell Cardinal, MD  empagliflozin  (JARDIANCE ) 10 MG TABS tablet Take 1 tablet (10 mg total) by mouth daily before breakfast. 12/23/23   Sabharwal,  Aditya, DO  ezetimibe  (ZETIA ) 10 MG tablet TAKE 1 TABLET BY MOUTH EVERY DAY 06/15/24   Krasowski, Robert J, MD  fluticasone  (FLONASE ) 50 MCG/ACT nasal spray SPRAY 2 SPRAYS INTO EACH NOSTRIL EVERY DAY 04/17/24   Saguier, [place], PA-C  gabapentin  (NEURONTIN ) 300 MG capsule TAKE 1 CAPSULE BY MOUTH THREE TIMES A DAY 03/24/24   Saguier, Dallas, PA-C  glipiZIDE  (GLUCOTROL ) 5 MG tablet Take 1 tablet (5 mg total) by mouth 2 (two) times daily before a meal. 04/20/24   Shamleffer, Ibtehal Jaralla, MD  ketorolac  (ACULAR ) 0.5 % ophthalmic solution INSTILL 1 DROP INTO RIGHT EYE 4 TIMES A DAY 04/07/22   Rankin, Arley LABOR, MD  latanoprost  (XALATAN ) 0.005 % ophthalmic solution Place 1 drop into both eyes daily. 05/05/19   [provider]  levocetirizine (XYZAL ) 5 MG tablet TAKE 1 TABLET BY MOUTH EVERY  DAY IN THE EVENING 04/12/24   Saguier, Dallas, PA-C  losartan  (COZAAR ) 25 MG tablet Take 0.5 tablets (12.5 mg total) by mouth daily. 05/31/24   Sabharwal, Aditya, DO  methylPREDNISolone  (MEDROL ) 4 MG tablet 4 tab po day 1, 3 tab po day 2, 2 tab po day 3 and 1 tab po day 4 06/06/24   Saguier, Dallas, PA-C  ofloxacin (OCUFLOX) 0.3 % ophthalmic solution INSTILL 1 DROP IN RIGHT EYE FOUR TIMES A DAY USE FOR 5 DAYS THEN STOP 01/13/24   [provider]  omeprazole  (PRILOSEC) 40 MG capsule Take 1 capsule (40 mg total) by mouth in the morning and at bedtime. 04/12/24   Webb, Padonda B, FNP  ondansetron  (ZOFRAN -ODT) 4 MG disintegrating tablet Take 1 tablet (4 mg total) by mouth every 8 (eight) hours as needed for nausea or vomiting. 06/27/24   Frann, Mabel Mt, DO  OneTouch Delica Lancets 33G MISC Use as instructed to check blood sugar 2-3 times a day.  DX E11.9 10/21/21   Shamleffer, Ibtehal Jaralla, MD  ONETOUCH ULTRA TEST test strip CHECK BLOOD SUGAR 2 TO 3 TIMES DAILY AS DIRECTED 07/12/23   Saguier, Dallas, PA-C  timolol  (TIMOPTIC ) 0.5 % ophthalmic solution Place 1 drop into both eyes 2 (two) times a day. 03/15/19    [provider]  Vitamin A  2250 MCG (7500 UT) CAPS Take 1 capsule by mouth daily. You can find this over the counter. 12/17/23   Wise, Nason S, RPH-CPP  vutrisiran  sodium (AMVUTTRA ) 25 MG/0.5ML syringe Inject 0.5 mLs (25 mg total) into the skin every 3 (three) months. 03/20/24   Zenaida Morene PARAS, MD  VYZULTA 0.024 % SOLN Apply 1 drop to eye at bedtime. 02/28/24   [provider]    Physical Exam: Vitals:   06/29/24 1439 06/29/24 1440  BP:  132/75  Pulse:  80  Resp:  18  Temp:  97.8 F (36.6 C)  TempSrc:  Oral  SpO2:  98%  Weight: 79.8 kg   Height: 5' 1 (1.549 m)    Physical Exam Vitals and nursing note reviewed.  Constitutional:      General: She is awake. She is not in acute distress.    Appearance: She is obese. She is ill-appearing.  HENT:     Head: Normocephalic.     Nose: No rhinorrhea.     Mouth/Throat:     Mouth: Mucous membranes are dry.  Eyes:     General: No scleral icterus.    Pupils: Pupils are equal, round, and reactive to light.  Neck:     Vascular: No JVD.  Cardiovascular:     Rate and Rhythm: Normal rate and regular rhythm.     Heart sounds: S1 normal and S2 normal.  Pulmonary:     Effort: Pulmonary effort is normal.     Breath sounds: No wheezing, rhonchi or rales.  Abdominal:     General: Bowel sounds are normal. There is no distension.     Palpations: Abdomen is soft.     Tenderness: There is abdominal tenderness in the right upper quadrant and epigastric area. There is right CVA tenderness and left CVA tenderness. There is no guarding or rebound.  Musculoskeletal:     Cervical back: Neck supple.     Right lower leg: No edema.     Left lower leg: No edema.  Skin:    General: Skin is warm and dry.  Neurological:     General: No focal deficit present.  Mental Status: She is alert and oriented to person, place, and time.  Psychiatric:        Mood and Affect: Mood normal.        Behavior: Behavior normal. Behavior is  cooperative.     Data Reviewed:  Results are pending, will review when available. December/2024 echocardiogram report. IMPRESSIONS:   1. Left ventricular ejection fraction, by estimation, is 55 to 60%. The  left ventricle has normal function. The left ventricle has no regional  wall motion abnormalities. There is moderate concentric left ventricular  hypertrophy. Left ventricular  diastolic parameters are consistent with Grade III diastolic dysfunction  (restrictive).   2. Right ventricular systolic function is mildly reduced. The right  ventricular size is mildly enlarged. There is mildly elevated pulmonary  artery systolic pressure. The estimated right ventricular systolic  pressure is 41.7 mmHg.   3. Left atrial size was moderately dilated.   4. Right atrial size was mild to moderately dilated.   5. The mitral valve is abnormal. Mild to moderate mitral valve  regurgitation. Mild mitral stenosis. There is mild holosystolic prolapse  of multiple segments of the anterior leaflet of the mitral valve.   6. Tricuspid valve regurgitation is mild to moderate.   7. The aortic valve is tricuspid. There is mild calcification of the  aortic valve. There is mild thickening of the aortic valve. Aortic valve  regurgitation is trivial. Aortic valve sclerosis/calcification is present,  without any evidence of aortic  stenosis.   8. The inferior vena cava is normal in size with greater than 50%  respiratory variability, suggesting right atrial pressure of 3 mmHg.   Conclusion(s)/Recommendation(s): LV has appearance of possible amyloid,  with moderate to severe LVH and restrictive filling. Consider cardiac MRI  or PYP scan for further evaluation.   EKG: Vent. rate 68 BPM PR interval 214 ms QRS duration 76 ms QT/QTcB 422/448 ms P-R-T axes 66 100 39 Sinus rhythm with sinus arrhythmia with 1st degree A-V block Possible Left atrial enlargement Rightward axis Low voltage QRS Nonspecific T  wave abnormality Abnormal ECG  Assessment and Plan: Principal Problem:   Acute pancreatitis Observation/telemetry. Continue IV fluids. Clear liquid diet. Analgesics as needed. Antiemetics as needed. Pantoprazole  40 mg IVP daily. Follow CBC, CMP and lipase in AM.  Active Problems: Medical reconciliation still pending.    Elevated troponin Chronically elevated. Will continue to monitor.    Abnormal EKG With sinus arrhythmia. Will optimize electrolytes.    Coronary artery disease, non-occlusive Continue aspirin  in AM. Might benefit from statin therapy. Follow-up with PCP and/or cardiology as an outpatient.    Hypertension Resume antihypertensives in AM.    Hyperlipidemia Follow-up with primary care provider.    Diabetes mellitus type 2, controlled (HCC) Will hold oral meds. CBG monitoring with R-ISS.    GERD (gastroesophageal reflux disease) Received Protonix  in the emergency department.    Stage 3b chronic kidney disease (HCC) Monitor renal function electrolytes.     Advance Care Planning:   Code Status: Limited: Do not attempt resuscitation (DNR) -DNR-LIMITED -Do Not Intubate/DNI    Consults:   Family Communication:   Severity of Illness: The appropriate patient status for this patient is OBSERVATION. Observation status is judged to be reasonable and necessary in order to provide the required intensity of service to ensure the patient's safety. The patient's presenting symptoms, physical exam findings, and initial radiographic and laboratory data in the context of their medical condition is felt to place them at decreased risk  for further clinical deterioration. Furthermore, it is anticipated that the patient will be medically stable for discharge from the hospital within 2 midnights of admission.   Author: Alm Dorn Castor, MD 06/29/2024 5:39 PM  For on call review [email or web address].   This document was prepared using Dragon voice recognition software and  may contain some unintended transcription errors.

## 2024-06-29 NOTE — Plan of Care (Signed)

## 2024-06-29 NOTE — Telephone Encounter (Signed)
 FYI Only or Action Required?: FYI only for provider.  Patient was last seen in primary care on 06/27/2024 by Frann Mabel Mt, DO.  Called Nurse Triage reporting No chief complaint on file..  Symptoms began several days ago.  Interventions attempted: Prescription medications: Dicyclomine , Ondansetron .  Symptoms are: unchanged.  Triage Disposition: Go to ED Now (Notify PCP)  Patient/caregiver understands and will follow disposition?: Yes    Copied from CRM [id number]. Topic: Clinical - Red Word Triage >> Jun 29, 2024  8:14 AM Suzen RAMAN wrote: Red Word that prompted transfer to Nurse Triage: stomach pain that travels into back Nausea. Was seen 06/27/24 and medication prescribed has been ineffective was advised by Dr. Frann to give office a call back if she wasn't feeling any better. Reason for Disposition  [1] SEVERE pain AND [2] age > 60 years  Answer Assessment - Initial Assessment Questions 1. LOCATION: Where does it hurt?      Abdominal Pain, Left Side  2. RADIATION: Does the pain shoot anywhere else? (e.g., chest, back)     Back  3. ONSET: When did the pain begin? (e.g., minutes, hours or days ago)      Since Tuesday  4. SUDDEN: Gradual or sudden onset?  Sudden, 4:00 AM this morning.   5. PATTERN Does the pain come and go, or is it constant?     Constant  6. SEVERITY: How bad is the pain?  (e.g., Scale 1-10; mild, moderate, or severe)     9-10  7. RECURRENT SYMPTOM: Have you ever had this type of stomach pain before? If Yes, ask: When was the last time? and What happened that time?      Yes, was seen in office on Tuesday  8. CAUSE: What do you think is causing the stomach pain? (e.g., gallstones, recent abdominal surgery)     Unsure  9. RELIEVING/AGGRAVATING FACTORS: What makes it better or worse? (e.g., antacids, bending or twisting motion, bowel movement)     No relieving factors at this time, patient describes pain as unrelenting  and not letting up.   10. OTHER SYMPTOMS: Do you have any other symptoms? (e.g., back pain, diarrhea, fever, urination pain, vomiting)       Nausea  11. PREGNANCY: Is there any chance you are pregnant? When was your last menstrual period?       No and no  Protocols used: Abdominal Pain - Female-A-AH

## 2024-06-29 NOTE — ED Notes (Signed)
 ED TO INPATIENT HANDOFF REPORT  ED Nurse Name and Phone #: [phone]  S Name/Age/Gender Donna Fuller 83 y.o. female Room/Bed: ED50A/ED50A  Code Status   Code Status: Prior  Home/SNF/Other Home Patient oriented to: self, place, time, and situation Is this baseline? Yes   Triage Complete: Triage complete  Chief Complaint Acute pancreatitis [K85.90]  Triage Note Pt via POV from home. Pt c/o periumbilical pain intermittently that radiates to her back that started Friday. Reports some nausea without vomiting. Denies any abd surgeries. Denies urinary symptoms. Pt is A&Ox4 and NAD    Allergies Allergies  Allergen Reactions   Penicillins Swelling   Sulfa Antibiotics Swelling   Amlodipine Swelling    LE Swelling   Lisinopril Cough    Level of Care/Admitting Diagnosis ED Disposition     ED Disposition  Admit   Condition  --   Comment  Hospital Area: [facility] [[id number]]  Level of Care: Telemetry Medical [104]  Covid Evaluation: Asymptomatic - no recent exposure (last 10 days) testing not required  Diagnosis: Acute pancreatitis [577.0.ICD-9-CM]  Admitting Physician: CELINDA ALM LOT [[id number]]  Attending Physician: CELINDA ALM LOT [[id number]]  For patients discharging to extended facilities (i.e. SNF, AL, group homes or LTAC) initiate:: Discharge to SNF/Facility Placement COVID-19 Lab Testing Protocol          B Medical/Surgery History Past Medical History:  Diagnosis Date   Arthritis    Breast cancer [facility]) 2007   Right breast, s/p mastectomy   Chronic diastolic congestive heart failure (HCC)    per patient, dx by cardiology   Diabetes mellitus without complication (HCC)    GERD (gastroesophageal reflux disease)    Gout    Hyperlipidemia    Hypertension    Spinal stenosis    Past Surgical History:  Procedure Laterality Date   ABDOMINAL HYSTERECTOMY     ABDOMINAL SURGERY     BREAST BIOPSY Left    2016 benign    BREAST BIOPSY  Left 02/16/2023   MM LT BREAST BX W LOC DEV 1ST LESION IMAGE BX SPEC STEREO GUIDE 02/16/2023 [facility]MO[facility]   LEFT HEART CATH AND CORONARY ANGIOGRAPHY N/A 11/21/2021   Procedure: LEFT HEART CATH AND CORONARY ANGIOGRAPHY;  Surgeon: Darron Deatrice LABOR, MD;  Location: [facility] INVASIVE CV LAB;  Service: Cardiovascular;  Laterality: N/A;   MASTECTOMY     right side   REPLACEMENT TOTAL KNEE Bilateral    TUBAL LIGATION       A IV Location/Drains/Wounds Patient Lines/Drains/Airways Status     Active Line/Drains/Airways     Name Placement date Placement time Site Days   Peripheral IV 06/29/24 20 G Right Antecubital 06/29/24  1543  Antecubital  less than 1            Intake/Output Last 24 hours No intake or output data in the 24 hours ending 06/29/24 1757  Labs/Imaging Results for orders placed or performed during the hospital encounter of 06/29/24 (from the past 48 hours)  Lipase, blood     Status: Abnormal   Collection Time: 06/29/24  2:42 PM  Result Value Ref Range   Lipase 155 (H) 11 - 51 U/L    Comment: Performed at [facility], [address] [place], [place] [address]  Comprehensive metabolic panel     Status: Abnormal   Collection Time: 06/29/24  2:42 PM  Result Value Ref Range   Sodium 142 135 - 145 mmol/L   Potassium 3.5 3.5 - 5.1 mmol/L  Chloride 98 98 - 111 mmol/L   CO2 25 22 - 32 mmol/L   Glucose, Bld 114 (H) 70 - 99 mg/dL    Comment: Glucose reference range applies only to samples taken after fasting for at least 8 hours.   BUN 25 (H) 8 - 23 mg/dL   Creatinine, Ser 8.58 (H) 0.44 - 1.00 mg/dL   Calcium 9.7 8.9 - 89.6 mg/dL   Total Protein 7.2 6.5 - 8.1 g/dL   Albumin 3.6 3.5 - 5.0 g/dL   AST 22 15 - 41 U/L   ALT 11 0 - 44 U/L   Alkaline Phosphatase 123 38 - 126 U/L   Total Bilirubin 1.5 (H) 0.0 - 1.2 mg/dL   GFR, Estimated 37 (L) >60 mL/min    Comment: (NOTE) Calculated using the CKD-EPI Creatinine Equation (2021)    Anion gap 19 (H) 5 - 15     Comment: Performed at [facility], [address] [place], [place] [address]  CBC     Status: None   Collection Time: 06/29/24  2:42 PM  Result Value Ref Range   WBC 8.2 4.0 - 10.5 K/uL   RBC 4.66 3.87 - 5.11 MIL/uL   Hemoglobin 14.1 12.0 - 15.0 g/dL   HCT 56.2 63.9 - 53.9 %   MCV 93.8 80.0 - 100.0 fL   MCH 30.3 26.0 - 34.0 pg   MCHC 32.3 30.0 - 36.0 g/dL   RDW 85.5 88.4 - 84.4 %   Platelets 177 150 - 400 K/uL   nRBC 0.0 0.0 - 0.2 %    Comment: Performed at [facility], [address] [place], [place] [address]  Troponin I (High Sensitivity)     Status: Abnormal   Collection Time: 06/29/24  2:42 PM  Result Value Ref Range   Troponin I (High Sensitivity) 127 (HH) <18 ng/L    Comment: CRITICAL RESULT CALLED TO, READ BACK BY AND VERIFIED WITH ZOEY Taleigha Pinson 06/29/24 1732 MU (NOTE) Elevated high sensitivity troponin I (hsTnI) values and significant  changes across serial measurements may suggest ACS but many other  chronic and acute conditions are known to elevate hsTnI results.  Refer to the Links section for chest pain algorithms and additional  guidance. Performed at [facility], [address] [place] [place], [place] [address]    CT ABDOMEN PELVIS W CONTRAST Result Date: 06/29/2024 CLINICAL DATA:  Acute periumbilical abdominal pain. EXAM: CT ABDOMEN AND PELVIS WITH CONTRAST TECHNIQUE: Multidetector CT imaging of the abdomen and pelvis was performed using the standard protocol following bolus administration of intravenous contrast. RADIATION DOSE REDUCTION: This exam was performed according to the departmental dose-optimization program which includes automated exposure control, adjustment of the mA and/or kV according to patient size and/or use of iterative reconstruction technique. CONTRAST:  80mL OMNIPAQUE  IOHEXOL  300 MG/ML  SOLN COMPARISON:  September 24, 2022. FINDINGS: Lower chest: Minimal bibasilar subsegmental atelectasis or scarring. Hepatobiliary: No  focal liver abnormality is seen. No gallstones, gallbladder wall thickening, or biliary dilatation. Pancreas: Inflammatory changes are seen involving pancreatic head suggesting acute pancreatitis. No pseudocyst or ductal dilatation is noted. Spleen: Normal in size without focal abnormality. Adrenals/Urinary Tract: Adrenal glands are unremarkable. Kidneys are normal, without renal calculi, focal lesion, or hydronephrosis. Bladder is unremarkable. Stomach/Bowel: Stomach is unremarkable. No evidence of bowel obstruction or inflammation. Vascular/Lymphatic: Aortic atherosclerosis. No enlarged abdominal or pelvic lymph nodes. Reproductive: Status post hysterectomy. No adnexal masses. Other: No ascites or hernia is noted. Musculoskeletal: No acute or significant  osseous findings. IMPRESSION: Findings consistent with acute pancreatitis. No definite pseudocyst formation is noted. Aortic Atherosclerosis (ICD10-I70.0). Electronically Signed   By: Lynwood Landy Raddle M.D.   On: 06/29/2024 17:06    Pending Labs Unresulted Labs (From admission, onward)     Start     Ordered   06/29/24 1734  Triglycerides  Add-on,   AD        06/29/24 1733   06/29/24 1442  Urinalysis, Routine w reflex microscopic -Urine, Clean Catch  Once,   URGENT       Question:  Specimen Source  Answer:  Urine, Clean Catch   06/29/24 1442            Vitals/Pain Today's Vitals   06/29/24 1439 06/29/24 1440 06/29/24 1543 06/29/24 1610  BP:  132/75    Pulse:  80    Resp:  18    Temp:  97.8 F (36.6 C)    TempSrc:  Oral    SpO2:  98%    Weight: 79.8 kg     Height: 5' 1 (1.549 m)     PainSc: 10-Worst pain ever  10-Worst pain ever 6     Isolation Precautions No active isolations  Medications Medications  pantoprazole  (PROTONIX ) injection 40 mg (40 mg Intravenous Given 06/29/24 1544)  fentaNYL  (SUBLIMAZE ) injection 25 mcg (25 mcg Intravenous Given 06/29/24 1543)  ondansetron  (ZOFRAN ) injection 4 mg (4 mg Intravenous Given 06/29/24  1543)  iohexol  (OMNIPAQUE ) 300 MG/ML solution 80 mL (80 mLs Intravenous Contrast Given 06/29/24 1639)  sodium chloride  0.9 % bolus 1,000 mL (1,000 mLs Intravenous Bolus from Bag 06/29/24 1755)    Mobility walks with device     Focused Assessments Cardiac Assessment Handoff:    Lab Results  Component Value Date   TROPONINI <0.03 05/11/2017   Lab Results  Component Value Date   DDIMER 0.43 11/08/2023   Does the Patient currently have chest pain? No    R Recommendations: See Admitting Provider Note  Report given to:   Additional Notes:

## 2024-06-29 NOTE — ED Provider Notes (Signed)
 [facility] Provider Note    Event Date/Time   First MD Initiated Contact with Patient 06/29/24 1514     (approximate)   History   Abdominal Pain   HPI  Donna Fuller is a 83 y.o. female with history of gastritis who comes in with 1 week of epigastric abdominal pain, nausea, vomiting.  Denies any prior abdominal surgeries.  Denies having this before.  Denies any known fevers.  She denies any new chest pain, shortness of breath.     Physical Exam   Triage Vital Signs: ED Triage Vitals  Encounter Vitals Group     BP 06/29/24 1440 132/75     Girls Systolic BP Percentile --      Girls Diastolic BP Percentile --      Boys Systolic BP Percentile --      Boys Diastolic BP Percentile --      Pulse Rate 06/29/24 1440 80     Resp 06/29/24 1440 18     Temp 06/29/24 1440 97.8 F (36.6 C)     Temp Source 06/29/24 1440 Oral     SpO2 06/29/24 1440 98 %     Weight 06/29/24 1439 176 lb (79.8 kg)     Height 06/29/24 1439 5' 1 (1.549 m)     Head Circumference --      Peak Flow --      Pain Score 06/29/24 1439 10     Pain Loc --      Pain Education --      Exclude from Growth Chart --     Most recent vital signs: Vitals:   06/29/24 1440  BP: 132/75  Pulse: 80  Resp: 18  Temp: 97.8 F (36.6 C)  SpO2: 98%     General: Awake, no distress.  CV:  Good peripheral perfusion.  Resp:  Normal effort.  Abd:  No distention.  Slightly tender throughout the abdomen without any rebound, guarding Other:     ED Results / Procedures / Treatments   Labs (all labs ordered are listed, but only abnormal results are displayed) Labs Reviewed  LIPASE, BLOOD - Abnormal; Notable for the following components:      Result Value   Lipase 155 (*)    All other components within normal limits  COMPREHENSIVE METABOLIC PANEL WITH GFR - Abnormal; Notable for the following components:   Glucose, Bld 114 (*)    BUN 25 (*)    Creatinine, Ser 1.41 (*)    Total Bilirubin  1.5 (*)    GFR, Estimated 37 (*)    Anion gap 19 (*)    All other components within normal limits  TROPONIN I (HIGH SENSITIVITY) - Abnormal; Notable for the following components:   Troponin I (High Sensitivity) 127 (*)    All other components within normal limits  CBC  URINALYSIS, ROUTINE W REFLEX MICROSCOPIC  TRIGLYCERIDES  TROPONIN I (HIGH SENSITIVITY)     EKG  My interpretation of EKG:  Sinus rate of 68 without any ST elevation, T wave version lead III, normal intervals  RADIOLOGY I have reviewed the ct personally and interpreted positive pancreatitis   PROCEDURES:  Critical Care performed: No  Procedures   MEDICATIONS ORDERED IN ED: Medications  pantoprazole  (PROTONIX ) injection 40 mg (40 mg Intravenous Given 06/29/24 1544)  fentaNYL  (SUBLIMAZE ) injection 25 mcg (25 mcg Intravenous Given 06/29/24 1543)  ondansetron  (ZOFRAN ) injection 4 mg (4 mg Intravenous Given 06/29/24 1543)  iohexol  (OMNIPAQUE ) 300 MG/ML solution 80  mL (80 mLs Intravenous Contrast Given 06/29/24 1639)  sodium chloride  0.9 % bolus 1,000 mL (1,000 mLs Intravenous Bolus from Bag 06/29/24 1755)     IMPRESSION / MDM / ASSESSMENT AND PLAN / ED COURSE  I reviewed the triage vital signs and the nursing notes.   Patient's presentation is most consistent with acute presentation with potential threat to life or bodily function.   Patient comes in with epigastric abdominal pain differential is cholecystitis, pancreatitis obstruction, perforation given patient's age we will proceed with CT imaging to further evaluate.  I did add on EKG, cardiac markers to ensure no ACS equivalent as she really denied any overt chest pain.  Troponin elevated but similar to when she has been admitted previously and felt not to be cardiac in nature.  Will continue to trend out but given no active chest pain and CT imaging consistent with pancreatitis seems more likely the cause of her symptoms her lipase is also elevated.  Unclear  what is causing the pancreatitis I will add on triglyceride, ultrasound to evaluate her gallbladder.  Given patient's age and difficulty tolerating p.o. I will discuss with hospital team for admission for further workup and management  Pt denies etoh use.     FINAL CLINICAL IMPRESSION(S) / ED DIAGNOSES   Final diagnoses:  Acute pancreatitis, unspecified complication status, unspecified pancreatitis type     Rx / DC Orders   ED Discharge Orders     None        Note:  This document was prepared using Dragon voice recognition software and may include unintentional dictation errors.   Ernest Ronal BRAVO, MD 06/29/24 [id number]

## 2024-06-29 NOTE — ED Triage Notes (Signed)
 Pt via POV from home. Pt c/o periumbilical pain intermittently that radiates to her back that started Friday. Reports some nausea without vomiting. Denies any abd surgeries. Denies urinary symptoms. Pt is A&Ox4 and NAD

## 2024-06-30 DIAGNOSIS — R9431 Abnormal electrocardiogram [ECG] [EKG]: Secondary | ICD-10-CM | POA: Diagnosis not present

## 2024-06-30 DIAGNOSIS — R1033 Periumbilical pain: Secondary | ICD-10-CM | POA: Diagnosis not present

## 2024-06-30 DIAGNOSIS — I10 Essential (primary) hypertension: Secondary | ICD-10-CM

## 2024-06-30 DIAGNOSIS — Z820 Family history of epilepsy and other diseases of the nervous system: Secondary | ICD-10-CM | POA: Diagnosis not present

## 2024-06-30 DIAGNOSIS — R1013 Epigastric pain: Secondary | ICD-10-CM | POA: Diagnosis not present

## 2024-06-30 DIAGNOSIS — I2510 Atherosclerotic heart disease of native coronary artery without angina pectoris: Secondary | ICD-10-CM | POA: Diagnosis not present

## 2024-06-30 DIAGNOSIS — K859 Acute pancreatitis without necrosis or infection, unspecified: Secondary | ICD-10-CM

## 2024-06-30 DIAGNOSIS — R7989 Other specified abnormal findings of blood chemistry: Secondary | ICD-10-CM | POA: Diagnosis not present

## 2024-06-30 DIAGNOSIS — E785 Hyperlipidemia, unspecified: Secondary | ICD-10-CM | POA: Diagnosis not present

## 2024-06-30 DIAGNOSIS — K219 Gastro-esophageal reflux disease without esophagitis: Secondary | ICD-10-CM | POA: Diagnosis not present

## 2024-06-30 DIAGNOSIS — Z8249 Family history of ischemic heart disease and other diseases of the circulatory system: Secondary | ICD-10-CM | POA: Diagnosis not present

## 2024-06-30 DIAGNOSIS — E66811 Obesity, class 1: Secondary | ICD-10-CM | POA: Diagnosis not present

## 2024-06-30 DIAGNOSIS — Z7984 Long term (current) use of oral hypoglycemic drugs: Secondary | ICD-10-CM | POA: Diagnosis not present

## 2024-06-30 DIAGNOSIS — E119 Type 2 diabetes mellitus without complications: Secondary | ICD-10-CM

## 2024-06-30 DIAGNOSIS — Z96653 Presence of artificial knee joint, bilateral: Secondary | ICD-10-CM | POA: Diagnosis not present

## 2024-06-30 DIAGNOSIS — E1122 Type 2 diabetes mellitus with diabetic chronic kidney disease: Secondary | ICD-10-CM | POA: Diagnosis not present

## 2024-06-30 DIAGNOSIS — Z833 Family history of diabetes mellitus: Secondary | ICD-10-CM | POA: Diagnosis not present

## 2024-06-30 DIAGNOSIS — Z90710 Acquired absence of both cervix and uterus: Secondary | ICD-10-CM | POA: Diagnosis not present

## 2024-06-30 DIAGNOSIS — E11319 Type 2 diabetes mellitus with unspecified diabetic retinopathy without macular edema: Secondary | ICD-10-CM | POA: Diagnosis not present

## 2024-06-30 DIAGNOSIS — Z83438 Family history of other disorder of lipoprotein metabolism and other lipidemia: Secondary | ICD-10-CM | POA: Diagnosis not present

## 2024-06-30 DIAGNOSIS — N1832 Chronic kidney disease, stage 3b: Secondary | ICD-10-CM | POA: Diagnosis not present

## 2024-06-30 DIAGNOSIS — Z853 Personal history of malignant neoplasm of breast: Secondary | ICD-10-CM | POA: Diagnosis not present

## 2024-06-30 DIAGNOSIS — Z794 Long term (current) use of insulin: Secondary | ICD-10-CM

## 2024-06-30 DIAGNOSIS — Z66 Do not resuscitate: Secondary | ICD-10-CM | POA: Diagnosis not present

## 2024-06-30 DIAGNOSIS — Z841 Family history of disorders of kidney and ureter: Secondary | ICD-10-CM | POA: Diagnosis not present

## 2024-06-30 DIAGNOSIS — Z87891 Personal history of nicotine dependence: Secondary | ICD-10-CM | POA: Diagnosis not present

## 2024-06-30 DIAGNOSIS — I130 Hypertensive heart and chronic kidney disease with heart failure and stage 1 through stage 4 chronic kidney disease, or unspecified chronic kidney disease: Secondary | ICD-10-CM | POA: Diagnosis not present

## 2024-06-30 DIAGNOSIS — Z7985 Long-term (current) use of injectable non-insulin antidiabetic drugs: Secondary | ICD-10-CM | POA: Diagnosis not present

## 2024-06-30 DIAGNOSIS — I5032 Chronic diastolic (congestive) heart failure: Secondary | ICD-10-CM | POA: Diagnosis not present

## 2024-06-30 DIAGNOSIS — Z7982 Long term (current) use of aspirin: Secondary | ICD-10-CM | POA: Diagnosis not present

## 2024-06-30 DIAGNOSIS — E1142 Type 2 diabetes mellitus with diabetic polyneuropathy: Secondary | ICD-10-CM | POA: Diagnosis not present

## 2024-06-30 LAB — GLUCOSE, CAPILLARY
Glucose-Capillary: 102 mg/dL — ABNORMAL HIGH (ref 70–99)
Glucose-Capillary: 181 mg/dL — ABNORMAL HIGH (ref 70–99)
Glucose-Capillary: 119 mg/dL — ABNORMAL HIGH (ref 70–99)
Glucose-Capillary: 232 mg/dL — ABNORMAL HIGH (ref 70–99)

## 2024-06-30 LAB — CBC
HCT: 41.7 % (ref 36.0–46.0)
Hemoglobin: 13.4 g/dL (ref 12.0–15.0)
MCH: 30.1 pg (ref 26.0–34.0)
MCHC: 32.1 g/dL (ref 30.0–36.0)
MCV: 93.7 fL (ref 80.0–100.0)
Platelets: 160 K/uL (ref 150–400)
RBC: 4.45 MIL/uL (ref 3.87–5.11)
RDW: 14.4 % (ref 11.5–15.5)
WBC: 6.6 K/uL (ref 4.0–10.5)
nRBC: 0 % (ref 0.0–0.2)

## 2024-06-30 LAB — COMPREHENSIVE METABOLIC PANEL WITH GFR
ALT: 9 U/L (ref 0–44)
AST: 19 U/L (ref 15–41)
Albumin: 3.3 g/dL — ABNORMAL LOW (ref 3.5–5.0)
Alkaline Phosphatase: 122 U/L (ref 38–126)
Anion gap: 11 (ref 5–15)
BUN: 21 mg/dL (ref 8–23)
CO2: 25 mmol/L (ref 22–32)
Calcium: 9 mg/dL (ref 8.9–10.3)
Chloride: 104 mmol/L (ref 98–111)
Creatinine, Ser: 1.16 mg/dL — ABNORMAL HIGH (ref 0.44–1.00)
GFR, Estimated: 47 mL/min — ABNORMAL LOW (ref >60)
Glucose, Bld: 115 mg/dL — ABNORMAL HIGH (ref 70–99)
Potassium: 4 mmol/L (ref 3.5–5.1)
Sodium: 140 mmol/L (ref 135–145)
Total Bilirubin: 0.7 mg/dL (ref 0.0–1.2)
Total Protein: 6.9 g/dL (ref 6.5–8.1)

## 2024-06-30 LAB — LIPASE, BLOOD: Lipase: 129 U/L — ABNORMAL HIGH (ref 11–51)

## 2024-06-30 MED ORDER — ASPIRIN 81 MG PO TBEC
81.0000 mg | DELAYED_RELEASE_TABLET | Freq: Every day | ORAL | Status: DC
  Administered 2024-06-30 – 2024-07-01 (×2): 81 mg via ORAL
  Filled 2024-06-30 (×2): qty 1

## 2024-06-30 MED ORDER — LOSARTAN POTASSIUM 25 MG PO TABS
12.5000 mg | ORAL_TABLET | Freq: Every day | ORAL | Status: DC
  Administered 2024-06-30 – 2024-07-01 (×2): 12.5 mg via ORAL
  Filled 2024-06-30 (×2): qty 1

## 2024-06-30 MED ORDER — KETOROLAC TROMETHAMINE 0.5 % OP SOLN
1.0000 [drp] | Freq: Four times a day (QID) | OPHTHALMIC | Status: DC
  Administered 2024-06-30 – 2024-07-01 (×3): 1 [drp] via OPHTHALMIC
  Filled 2024-06-30: qty 3

## 2024-06-30 MED ORDER — GABAPENTIN 300 MG PO CAPS
300.0000 mg | ORAL_CAPSULE | Freq: Three times a day (TID) | ORAL | Status: DC
Start: 2024-06-30 — End: 2024-07-01
  Administered 2024-06-30 – 2024-07-01 (×4): 300 mg via ORAL
  Filled 2024-06-30 (×4): qty 1

## 2024-06-30 MED ORDER — POLYETHYLENE GLYCOL 3350 17 G PO PACK
17.0000 g | PACK | Freq: Every day | ORAL | Status: DC
  Administered 2024-06-30 – 2024-07-01 (×2): 17 g via ORAL
  Filled 2024-06-30 (×2): qty 1

## 2024-06-30 MED ORDER — LACTATED RINGERS IV BOLUS
500.0000 mL | Freq: Once | INTRAVENOUS | Status: AC
  Administered 2024-06-30: 500 mL via INTRAVENOUS

## 2024-06-30 MED ORDER — DICYCLOMINE HCL 10 MG PO CAPS: 10.0000 mg | ORAL_CAPSULE | Freq: Three times a day (TID) | ORAL | Status: DC | PRN

## 2024-06-30 MED ORDER — EMPAGLIFLOZIN 10 MG PO TABS
10.0000 mg | ORAL_TABLET | Freq: Every day | ORAL | Status: DC
  Administered 2024-07-01: 10 mg via ORAL
  Filled 2024-06-30: qty 1

## 2024-06-30 NOTE — Assessment & Plan Note (Signed)
 Seems nonacute first-degree heart block and nonspecific ST changes.

## 2024-06-30 NOTE — Assessment & Plan Note (Signed)
Blood pressure within goal. -Continue home losartan

## 2024-06-30 NOTE — Assessment & Plan Note (Signed)
 Continue PPI.

## 2024-06-30 NOTE — Progress Notes (Signed)
 Patient reports acute onset abdominal pain after eating fried chicken. CBGs at the time were ~380. Of note, patient is on Trulicity  at home, which has some data showing it may contribute as well. May benefit from transition to [facility] outpatient.

## 2024-06-30 NOTE — Assessment & Plan Note (Signed)
 Continue home Jardiance  -SSI

## 2024-06-30 NOTE — Assessment & Plan Note (Signed)
 Symptoms started after eating some fried chicken, no other significant risk factors.  Patient was on Trulicity  which sometimes can be contributory so pharmacy is recommending outpatient transition to Ozempic. Improving lipase and pain -Advancing diet to full liquid -Continue with supportive care

## 2024-06-30 NOTE — Assessment & Plan Note (Signed)
 Renal function seems to be around baseline. Monitor renal function- -avoid nephrotoxins

## 2024-06-30 NOTE — Hospital Course (Addendum)
  Donna Fuller is a 83 y.o. female with medical history significant of class I obesity, osteoarthritis, right breast cancer, status postmastectomy, chronic diastolic CHF, LVH, type 2 diabetes, GERD, gout, hyperlipidemia, hypertension, spinal stenosis, peripheral neuropathy, diabetic retinopathy, glaucoma who presented to the emergency department complaints of abdominal pain for the past 8 days after she ate fried chicken.   On presentation stable vitals, labs with lipase of 115, creatinine 1.41, T. bili 1.5, troponin 127>>139 CT abdomen and pelvis consistent with acute pancreatitis, no definite pseudocyst.  Patient was started on pain management and supportive care.  8/1: Hemodynamically stable, some improvement of creatinine to 1.16, T. bili normalized, lipase at 129.  Triglyceride normal at 133. Patient does not drink alcohol  and no recent change in medications except adding diclofenac but her symptoms started before taking that.  Patient was on Trulicity  at home which has some data showing it may contribute to pancreatitis, pharmacy is suggesting outpatient transition to Ozempic. Advancing diet to full liquid today.  8/2: Remained hemodynamically stable with improving lipase now at 87.  Able to tolerate soft diet now.  No more nausea or vomiting.  Pain with significant improvement and no more tenderness.  Patient will continue her home medication and need to discuss with her primary care provider regarding transitioning from Trulicity .  She was given some Percocet to use as needed for severe pain.  Patient will follow-up with her providers for further assistance.

## 2024-06-30 NOTE — Care Management Obs Status (Signed)
 MEDICARE OBSERVATION STATUS NOTIFICATION   Patient Details  Name: Donna Fuller MRN: [id number] Date of Birth: [birth year]   Medicare Observation Status Notification Given:  Yes    Rojelio SHAUNNA Rattler 06/30/2024, 12:12 PM

## 2024-06-30 NOTE — Progress Notes (Signed)
 Progress Note   Patient: Donna Fuller FMW:[id number] DOB: 11-30-1941 DOA: 06/29/2024     0 DOS: the patient was seen and examined on 06/30/2024   Brief hospital course: Taken from H&P.  RIGBY SWAMY is a 83 y.o. female with medical history significant of class I obesity, osteoarthritis, right breast cancer, status postmastectomy, chronic diastolic CHF, LVH, type 2 diabetes, GERD, gout, hyperlipidemia, hypertension, spinal stenosis, peripheral neuropathy, diabetic retinopathy, glaucoma who presented to the emergency department complaints of abdominal pain for the past 8 days after she ate fried chicken.   On presentation stable vitals, labs with lipase of 115, creatinine 1.41, T. bili 1.5, troponin 127>>139 CT abdomen and pelvis consistent with acute pancreatitis, no definite pseudocyst.  Patient was started on pain management and supportive care.  8/1: Hemodynamically stable, some improvement of creatinine to 1.16, T. bili normalized, lipase at 129.  Triglyceride normal at 133. Patient does not drink alcohol  and no recent change in medications except adding diclofenac but her symptoms started before taking that.  Patient was on Trulicity  at home which has some data showing it may contribute to pancreatitis, pharmacy is suggesting outpatient transition to Ozempic. Advancing diet to full liquid today.   Assessment and Plan: * Acute pancreatitis Symptoms started after eating some fried chicken, no other significant risk factors.  Patient was on Trulicity  which sometimes can be contributory so pharmacy is recommending outpatient transition to Ozempic. Improving lipase and pain -Advancing diet to full liquid -Continue with supportive care  Elevated troponin No chest pain, seems like having chronically mildly elevated troponin.  Coronary artery disease, non-occlusive No chest pain. - Continue home aspirin  -Statin need to be added by PCP once recovered from current illness  Abnormal  EKG Seems nonacute first-degree heart block and nonspecific ST changes.  Hypertension Blood pressure within goal -Continue home losartan   Stage 3b chronic kidney disease (HCC) Renal function seems to be around baseline. Monitor renal function- -avoid nephrotoxins  Diabetes mellitus type 2, controlled (HCC) Continue home Jardiance  -SSI  GERD (gastroesophageal reflux disease) - Continue PPI   Subjective: Patient was seen and examined today.  Improving pain, still having mild nausea but no vomiting.  Patient denies any alcohol  use.  No recent medications.  Physical Exam: Vitals:   06/29/24 1836 06/29/24 2103 06/30/24 0427 06/30/24 0904  BP: (!) 150/79 130/62 114/60 117/60  Pulse: 69 85 83 72  Resp: 20 16 16 18   Temp: 97.7 F (36.5 C) 97.7 F (36.5 C) 97.6 F (36.4 C) 98.2 F (36.8 C)  TempSrc: Oral     SpO2: 100% 92% 95% 99%  Weight:      Height:       General.  Obese lady, in no acute distress. Pulmonary.  Lungs clear bilaterally, normal respiratory effort. CV.  Regular rate and rhythm, no JVD, rub or murmur. Abdomen.  Soft, mild LUQ tenderness, nondistended, BS positive. CNS.  Alert and oriented .  No focal neurologic deficit. Extremities.  No edema, no cyanosis, pulses intact and symmetrical. Psychiatry.  Judgment and insight appears normal.   Data Reviewed: Prior data reviewed  Family Communication: Tried calling daughter with no response  Disposition: Status is: Inpatient Remains inpatient appropriate because: Severity of illness  Planned Discharge Destination: Home  DVT prophylaxis.  Lovenox  Time spent: 50 minutes  This record has been created using Conservation officer, historic buildings. Errors have been sought and corrected,but may not always be located. Such creation errors do not reflect on the standard of care.  Author: Amaryllis Dare, MD 06/30/2024 2:13 PM  For on call review [email or web address].

## 2024-06-30 NOTE — Assessment & Plan Note (Signed)
 No chest pain, seems like having chronically mildly elevated troponin.

## 2024-06-30 NOTE — Assessment & Plan Note (Signed)
 No chest pain. - Continue home aspirin  -Statin need to be added by PCP once recovered from current illness

## 2024-07-01 DIAGNOSIS — N1832 Chronic kidney disease, stage 3b: Secondary | ICD-10-CM | POA: Diagnosis not present

## 2024-07-01 DIAGNOSIS — R7989 Other specified abnormal findings of blood chemistry: Secondary | ICD-10-CM | POA: Diagnosis not present

## 2024-07-01 DIAGNOSIS — I10 Essential (primary) hypertension: Secondary | ICD-10-CM | POA: Diagnosis not present

## 2024-07-01 DIAGNOSIS — K859 Acute pancreatitis without necrosis or infection, unspecified: Secondary | ICD-10-CM | POA: Diagnosis not present

## 2024-07-01 LAB — GLUCOSE, CAPILLARY: Glucose-Capillary: 95 mg/dL (ref 70–99)

## 2024-07-01 LAB — LIPASE, BLOOD: Lipase: 87 U/L — ABNORMAL HIGH (ref 11–51)

## 2024-07-01 MED ORDER — POLYETHYLENE GLYCOL 3350 17 G PO PACK: 17.0000 g | PACK | Freq: Every day | ORAL | 0 refills | Status: AC

## 2024-07-01 MED ORDER — OXYCODONE-ACETAMINOPHEN 5-325 MG PO TABS: 1.0000 | ORAL_TABLET | Freq: Four times a day (QID) | ORAL | 0 refills | Status: DC | PRN

## 2024-07-01 NOTE — Plan of Care (Signed)

## 2024-07-01 NOTE — Discharge Summary (Signed)
 Physician Discharge Summary   Patient: Donna Fuller MRN: [id number] DOB: 1941/02/08  Admit date:     06/29/2024  Discharge date: 07/01/24  Discharge Physician: Amaryllis Dare   PCP: Dorina Loving, PA-C   Recommendations at discharge:  Please obtain CBC and CMP and follow-up Please consider transitioning from Trulicity , likely Ozempic as we could not find any other risk factor for her pancreatitis Follow-up with primary care provider within a week  Discharge Diagnoses: Principal Problem:   Acute pancreatitis Active Problems:   Elevated troponin   Coronary artery disease, non-occlusive   Abnormal EKG   Hypertension   Stage 3b chronic kidney disease (HCC)   Diabetes mellitus type 2, controlled (HCC)   Hyperlipidemia   GERD (gastroesophageal reflux disease)   Hospital Course:  Donna Fuller is a 83 y.o. female with medical history significant of class I obesity, osteoarthritis, right breast cancer, status postmastectomy, chronic diastolic CHF, LVH, type 2 diabetes, GERD, gout, hyperlipidemia, hypertension, spinal stenosis, peripheral neuropathy, diabetic retinopathy, glaucoma who presented to the emergency department complaints of abdominal pain for the past 8 days after she ate fried chicken.   On presentation stable vitals, labs with lipase of 115, creatinine 1.41, T. bili 1.5, troponin 127>>139 CT abdomen and pelvis consistent with acute pancreatitis, no definite pseudocyst.  Patient was started on pain management and supportive care.  8/1: Hemodynamically stable, some improvement of creatinine to 1.16, T. bili normalized, lipase at 129.  Triglyceride normal at 133. Patient does not drink alcohol  and no recent change in medications except adding diclofenac but her symptoms started before taking that.  Patient was on Trulicity  at home which has some data showing it may contribute to pancreatitis, pharmacy is suggesting outpatient transition to Ozempic. Advancing diet to  full liquid today.  8/2: Remained hemodynamically stable with improving lipase now at 87.  Able to tolerate soft diet now.  No more nausea or vomiting.  Pain with significant improvement and no more tenderness.  Patient will continue her home medication and need to discuss with her primary care provider regarding transitioning from Trulicity .  She was given some Percocet to use as needed for severe pain.  Patient will follow-up with her providers for further assistance.  Assessment and Plan: * Acute pancreatitis Symptoms started after eating some fried chicken, no other significant risk factors.  Patient was on Trulicity  which sometimes can be contributory so pharmacy is recommending outpatient transition to Ozempic. Improving lipase and pain  Elevated troponin No chest pain, seems like having chronically mildly elevated troponin.  Coronary artery disease, non-occlusive No chest pain. - Continue home aspirin  -Statin need to be added by PCP once recovered from current illness  Abnormal EKG Seems nonacute first-degree heart block and nonspecific ST changes.  Hypertension Blood pressure within goal -Continue home losartan   Stage 3b chronic kidney disease (HCC) Renal function seems to be around baseline. Monitor renal function- -avoid nephrotoxins  Diabetes mellitus type 2, controlled (HCC) Continue home Jardiance  -SSI  GERD (gastroesophageal reflux disease) - Continue PPI  Consultants: None Procedures performed: None Disposition: Home Diet recommendation:  Discharge Diet Orders (From admission, onward)     Start     Ordered   07/01/24 0000  Diet - low sodium heart healthy        07/01/24 1207           Cardiac and Carb modified diet DISCHARGE MEDICATION: Allergies as of 07/01/2024       Reactions   Penicillins Swelling  Sulfa Antibiotics Swelling   Amlodipine Swelling   LE Swelling   Lisinopril Cough        Medication List     TAKE these  medications    acetaminophen  650 MG CR tablet Commonly known as: TYLENOL  Take 650 mg by mouth 2 (two) times daily.   albuterol  108 (90 Base) MCG/ACT inhaler Commonly known as: VENTOLIN  HFA INHALE 2 PUFFS BY MOUTH EVERY 6 HOURS AS NEEDED   Alcohol  Prep Pad 70 % Pads Use to check blood sugars   Amvuttra  25 MG/0.5ML syringe Generic drug: vutrisiran  sodium Inject 0.5 mLs (25 mg total) into the skin every 3 (three) months.   ascorbic acid 1000 MG tablet Commonly known as: VITAMIN C Take 1,000 mg by mouth daily.   aspirin  EC 81 MG tablet Take 1 tablet (81 mg total) by mouth daily. Swallow whole.   Azelastine HCl 137 MCG/SPRAY Soln SMARTSIG:1-2 Spray(s) Both Nares Twice Daily   B-12 PO Take 1 tablet by mouth daily.   blood glucose meter kit and supplies Kit Dispense based on patient and insurance preference. Use up to four times daily as directed. (FOR ICD-9 250.00, 250.01).   brimonidine  0.2 % ophthalmic solution Commonly known as: ALPHAGAN  Place 1 drop into both eyes 3 (three) times daily.   bumetanide  1 MG tablet Commonly known as: BUMEX  Take 2 mg by mouth daily. What changed:  how much to take when to take this   dicyclomine  10 MG capsule Commonly known as: BENTYL  Take 1 tab every 6 hours as needed for abdominal cramping.   empagliflozin  10 MG Tabs tablet Commonly known as: Jardiance  Take 1 tablet (10 mg total) by mouth daily before breakfast.   ezetimibe  10 MG tablet Commonly known as: ZETIA  TAKE 1 TABLET BY MOUTH EVERY DAY   fluticasone  50 MCG/ACT nasal spray Commonly known as: FLONASE  SPRAY 2 SPRAYS INTO EACH NOSTRIL EVERY DAY   gabapentin  300 MG capsule Commonly known as: NEURONTIN  TAKE 1 CAPSULE BY MOUTH THREE TIMES A DAY   glipiZIDE  5 MG tablet Commonly known as: GLUCOTROL  Take 1 tablet (5 mg total) by mouth 2 (two) times daily before a meal.   ketorolac  0.5 % ophthalmic solution Commonly known as: ACULAR  INSTILL 1 DROP INTO RIGHT EYE 4 TIMES A  DAY   LANACANE EX Apply 1 application  topically daily as needed. Roll-on daily as needed for knee pain.   levocetirizine 5 MG tablet Commonly known as: XYZAL  TAKE 1 TABLET BY MOUTH EVERY DAY IN THE EVENING   losartan  25 MG tablet Commonly known as: COZAAR  Take 0.5 tablets (12.5 mg total) by mouth daily.   omeprazole  40 MG capsule Commonly known as: PRILOSEC Take 1 capsule (40 mg total) by mouth in the morning and at bedtime.   ondansetron  4 MG disintegrating tablet Commonly known as: ZOFRAN -ODT Take 1 tablet (4 mg total) by mouth every 8 (eight) hours as needed for nausea or vomiting.   OneTouch Delica Lancets 33G Misc Use as instructed to check blood sugar 2-3 times a day.  DX E11.9   OneTouch Ultra Test test strip Generic drug: glucose blood CHECK BLOOD SUGAR 2 TO 3 TIMES DAILY AS DIRECTED   oxyCODONE -acetaminophen  5-325 MG tablet Commonly known as: Percocet Take 1 tablet by mouth every 6 (six) hours as needed for severe pain (pain score 7-10).   polyethylene glycol 17 g packet Commonly known as: MIRALAX  / GLYCOLAX  Take 17 g by mouth daily. Start taking on: July 02, 2024   timolol   0.5 % ophthalmic solution Commonly known as: TIMOPTIC  Place 1 drop into both eyes 2 (two) times a day.   Trulicity  1.5 MG/0.5ML Soaj Generic drug: Dulaglutide  INJECT 1.5 MG (0.5ML) UNDER THE SKIN ONCE A WEEK   Vitamin A  2250 MCG (7500 UT) Caps Take 1 capsule by mouth daily. You can find this over the counter.   VITAMIN D-3 PO Take 2,000 Units by mouth daily.   Vyzulta 0.024 % Soln Generic drug: Latanoprostene Bunod Apply 1 drop to eye at bedtime.        Follow-up Information     Saguier, Edward, PA-C. Schedule an appointment as soon as possible for a visit in 1 week(s).   Specialties: Internal Medicine, Family Medicine Contact information: [address] [place] [address] [phone]                Discharge Exam: Fredricka Weights   06/29/24  1439  Weight: 79.8 kg   General.  Well-developed elderly lady, in no acute distress. Pulmonary.  Lungs clear bilaterally, normal respiratory effort. CV.  Regular rate and rhythm, no JVD, rub or murmur. Abdomen.  Soft, nontender, nondistended, BS positive. CNS.  Alert and oriented .  No focal neurologic deficit. Extremities.  No edema, no cyanosis, pulses intact and symmetrical. Psychiatry.  Judgment and insight appears normal.   Condition at discharge: stable  The results of significant diagnostics from this hospitalization (including imaging, microbiology, ancillary and laboratory) are listed below for reference.   Imaging Studies: US  ABDOMEN LIMITED RUQ (LIVER/GB) Result Date: 06/29/2024 CLINICAL DATA:  Pancreatitis EXAM: ULTRASOUND ABDOMEN LIMITED RIGHT UPPER QUADRANT COMPARISON:  CT abdomen and pelvis 06/29/2024. FINDINGS: Gallbladder: No gallstones or wall thickening visualized. No sonographic Murphy sign noted by sonographer. Common bile duct: Diameter: 5 mm. Liver: No focal lesion identified. Within normal limits in parenchymal echogenicity. Portal vein is patent on color Doppler imaging with normal direction of blood flow towards the liver. Other: Incidental note is made of an echogenic right kidney. IMPRESSION: 1. No cholelithiasis or sonographic evidence of acute cholecystitis. 2. Incidental note is made of an echogenic right kidney, which can be seen in the setting of medical renal disease. Electronically Signed   By: Greig Pique M.D.   On: 06/29/2024 18:20   CT ABDOMEN PELVIS W CONTRAST Result Date: 06/29/2024 CLINICAL DATA:  Acute periumbilical abdominal pain. EXAM: CT ABDOMEN AND PELVIS WITH CONTRAST TECHNIQUE: Multidetector CT imaging of the abdomen and pelvis was performed using the standard protocol following bolus administration of intravenous contrast. RADIATION DOSE REDUCTION: This exam was performed according to the departmental dose-optimization program which includes  automated exposure control, adjustment of the mA and/or kV according to patient size and/or use of iterative reconstruction technique. CONTRAST:  80mL OMNIPAQUE  IOHEXOL  300 MG/ML  SOLN COMPARISON:  September 24, 2022. FINDINGS: Lower chest: Minimal bibasilar subsegmental atelectasis or scarring. Hepatobiliary: No focal liver abnormality is seen. No gallstones, gallbladder wall thickening, or biliary dilatation. Pancreas: Inflammatory changes are seen involving pancreatic head suggesting acute pancreatitis. No pseudocyst or ductal dilatation is noted. Spleen: Normal in size without focal abnormality. Adrenals/Urinary Tract: Adrenal glands are unremarkable. Kidneys are normal, without renal calculi, focal lesion, or hydronephrosis. Bladder is unremarkable. Stomach/Bowel: Stomach is unremarkable. No evidence of bowel obstruction or inflammation. Vascular/Lymphatic: Aortic atherosclerosis. No enlarged abdominal or pelvic lymph nodes. Reproductive: Status post hysterectomy. No adnexal masses. Other: No ascites or hernia is noted. Musculoskeletal: No acute or significant osseous findings. IMPRESSION: Findings consistent with acute pancreatitis.  No definite pseudocyst formation is noted. Aortic Atherosclerosis (ICD10-I70.0). Electronically Signed   By: Lynwood Landy Raddle M.D.   On: 06/29/2024 17:06   US  Venous Img Lower Unilateral Left Result Date: 06/06/2024 CLINICAL DATA:  Popliteal pain. Left ankle pain and swelling. And coagulation therapy. EXAM: Left LOWER EXTREMITY VENOUS DOPPLER ULTRASOUND TECHNIQUE: Gray-scale sonography with compression, as well as color and duplex ultrasound, were performed to evaluate the deep venous system(s) from the level of the common femoral vein through the popliteal and proximal calf veins. COMPARISON:  06/11/2023 FINDINGS: VENOUS Normal compressibility of the common femoral, superficial femoral, and popliteal veins, as well as the visualized calf veins. Visualized portions of profunda  femoral vein and great saphenous vein unremarkable. No filling defects to suggest DVT on grayscale or color Doppler imaging. Doppler waveforms show normal direction of venous flow, normal respiratory plasticity and response to augmentation. Limited views of the contralateral common femoral vein are unremarkable. OTHER None. Limitations: none IMPRESSION: No evidence of acute deep venous thrombosis in the visualized lower extremity veins. Electronically Signed   By: Elsie Gravely M.D.   On: 06/06/2024 19:48   DG Ankle Complete Left Result Date: 06/06/2024 CLINICAL DATA:  left ankle pain; left foot pain. EXAM: LEFT ANKLE COMPLETE - 3+ VIEW; LEFT FOOT - COMPLETE 3+ VIEW COMPARISON:  None Available. FINDINGS: No acute fracture or dislocation. No aggressive osseous lesion. Ankle mortise appears intact. No focal soft tissue swelling. No radiopaque foreign bodies. IMPRESSION: No acute osseous abnormality of the left ankle or foot. Electronically Signed   By: Ree Molt M.D.   On: 06/06/2024 12:04   DG Foot Complete Left Result Date: 06/06/2024 CLINICAL DATA:  left ankle pain; left foot pain. EXAM: LEFT ANKLE COMPLETE - 3+ VIEW; LEFT FOOT - COMPLETE 3+ VIEW COMPARISON:  None Available. FINDINGS: No acute fracture or dislocation. No aggressive osseous lesion. Ankle mortise appears intact. No focal soft tissue swelling. No radiopaque foreign bodies. IMPRESSION: No acute osseous abnormality of the left ankle or foot. Electronically Signed   By: Ree Molt M.D.   On: 06/06/2024 12:04    Microbiology: Results for orders placed or performed during the hospital encounter of 11/05/23  Resp panel by RT-PCR (RSV, Flu A&B, Covid) Anterior Nasal Swab     Status: None   Collection Time: 11/05/23  6:02 PM   Specimen: Anterior Nasal Swab  Result Value Ref Range Status   SARS Coronavirus 2 by RT PCR NEGATIVE NEGATIVE Final    Comment: (NOTE) SARS-CoV-2 target nucleic acids are NOT DETECTED.  The SARS-CoV-2 RNA is  generally detectable in upper respiratory specimens during the acute phase of infection. The lowest concentration of SARS-CoV-2 viral copies this assay can detect is 138 copies/mL. A negative result does not preclude SARS-Cov-2 infection and should not be used as the sole basis for treatment or other patient management decisions. A negative result may occur with  improper specimen collection/handling, submission of specimen other than nasopharyngeal swab, presence of viral mutation(s) within the areas targeted by this assay, and inadequate number of viral copies(<138 copies/mL). A negative result must be combined with clinical observations, patient history, and epidemiological information. The expected result is Negative.  Fact Sheet for Patients:  BloggerCourse.com  Fact Sheet for Healthcare Providers:  SeriousBroker.it  This test is no t yet approved or cleared by the [place]  FDA and  has been authorized for detection and/or diagnosis of SARS-CoV-2 by FDA under an Emergency Use Authorization (EUA). This EUA will remain  in effect (meaning this test can be used) for the duration of the COVID-19 declaration under Section 564(b)(1) of the Act, 21 U.S.C.section 360bbb-3(b)(1), unless the authorization is terminated  or revoked sooner.       Influenza A by PCR NEGATIVE NEGATIVE Final   Influenza B by PCR NEGATIVE NEGATIVE Final    Comment: (NOTE) The Xpert Xpress SARS-CoV-2/FLU/RSV plus assay is intended as an aid in the diagnosis of influenza from Nasopharyngeal swab specimens and should not be used as a sole basis for treatment. Nasal washings and aspirates are unacceptable for Xpert Xpress SARS-CoV-2/FLU/RSV testing.  Fact Sheet for Patients: BloggerCourse.com  Fact Sheet for Healthcare Providers: SeriousBroker.it  This test is not yet approved or cleared by the [place] FDA and has been authorized for detection and/or diagnosis of SARS-CoV-2 by FDA under an Emergency Use Authorization (EUA). This EUA will remain in effect (meaning this test can be used) for the duration of the COVID-19 declaration under Section 564(b)(1) of the Act, 21 U.S.C. section 360bbb-3(b)(1), unless the authorization is terminated or revoked.     Resp Syncytial Virus by PCR NEGATIVE NEGATIVE Final    Comment: (NOTE) Fact Sheet for Patients: BloggerCourse.com  Fact Sheet for Healthcare Providers: SeriousBroker.it  This test is not yet approved or cleared by the [place]  FDA and has been authorized for detection and/or diagnosis of SARS-CoV-2 by FDA under an Emergency Use Authorization (EUA). This EUA will remain in effect (meaning this test can be used) for the duration of the COVID-19 declaration under Section 564(b)(1) of the Act, 21 U.S.C. section 360bbb-3(b)(1), unless the authorization is terminated or revoked.  Performed at [facility], [address] [place], [place] [address]   Respiratory (~20 pathogens) panel by PCR     Status: None   Collection Time: 11/06/23 10:18 AM   Specimen: Nasopharyngeal Swab; Respiratory  Result Value Ref Range Status   Adenovirus NOT DETECTED NOT DETECTED Final   Coronavirus 229E NOT DETECTED NOT DETECTED Final    Comment: (NOTE) The Coronavirus on the Respiratory Panel, DOES NOT test for the novel  Coronavirus (2019 nCoV)    Coronavirus HKU1 NOT DETECTED NOT DETECTED Final   Coronavirus NL63 NOT DETECTED NOT DETECTED Final   Coronavirus OC43 NOT DETECTED NOT DETECTED Final   Metapneumovirus NOT DETECTED NOT DETECTED Final   Rhinovirus / Enterovirus NOT DETECTED NOT DETECTED Final   Influenza A NOT DETECTED NOT DETECTED Final   Influenza B NOT DETECTED NOT DETECTED Final   Parainfluenza Virus 1 NOT DETECTED NOT DETECTED Final   Parainfluenza Virus 2 NOT  DETECTED NOT DETECTED Final   Parainfluenza Virus 3 NOT DETECTED NOT DETECTED Final   Parainfluenza Virus 4 NOT DETECTED NOT DETECTED Final   Respiratory Syncytial Virus NOT DETECTED NOT DETECTED Final   Bordetella pertussis NOT DETECTED NOT DETECTED Final   Bordetella Parapertussis NOT DETECTED NOT DETECTED Final   Chlamydophila pneumoniae NOT DETECTED NOT DETECTED Final   Mycoplasma pneumoniae NOT DETECTED NOT DETECTED Final    Comment: Performed at [facility], [address] [place], [place] [address]  Expectorated Sputum Assessment w Gram Stain, Rflx to Resp Cult     Status: None   Collection Time: 11/07/23  6:40 AM   Specimen: Sputum  Result Value Ref Range Status   Specimen Description SPUTUM  Final   Special Requests NONE  Final   Sputum evaluation   Final    THIS SPECIMEN IS ACCEPTABLE FOR SPUTUM CULTURE Performed  at [facility], [address] [place], [place] [address]    Report Status 11/07/2023 FINAL  Final  Culture, Respiratory w Gram Stain     Status: None   Collection Time: 11/07/23  6:40 AM   Specimen: SPU  Result Value Ref Range Status   Specimen Description   Final    SPUTUM Performed at [facility], [address] [place], [place] [address]    Special Requests   Final    NONE Reflexed from [id number] Performed at [facility], [address] [place], [place] [address]    Gram Stain   Final    FEW WBC SEEN FEW GRAM POSITIVE COCCI RARE GRAM NEGATIVE RODS    Culture   Final    ABUNDANT MORAXELLA CATARRHALIS(BRANHAMELLA) BETA LACTAMASE POSITIVE Performed at [facility], [address], [place], [place] [address]    Report Status 11/10/2023 FINAL  Final    Labs: CBC: Recent Labs  Lab 06/27/24 1440 06/29/24 1442 06/30/24 0432  WBC 9.4 8.2 6.6  HGB 14.4 14.1 13.4  HCT 43.4 43.7 41.7  MCV 93.5 93.8 93.7  PLT 167.0 177 160   Basic Metabolic Panel: Recent Labs  Lab 06/27/24 1440 06/29/24 1442  06/30/24 0432  NA 141 142 140  K 3.3* 3.5 4.0  CL 97 98 104  CO2 33* 25 25  GLUCOSE 151* 114* 115*  BUN 23 25* 21  CREATININE 1.39* 1.41* 1.16*  CALCIUM 9.8 9.7 9.0  MG  --  2.3  --   PHOS  --  3.4  --    Liver Function Tests: Recent Labs  Lab 06/27/24 1440 06/29/24 1442 06/30/24 0432  AST 17 22 19   ALT 12 11 9   ALKPHOS 145* 123 122  BILITOT 0.7 1.5* 0.7  PROT 7.4 7.2 6.9  ALBUMIN 4.3 3.6 3.3*   CBG: Recent Labs  Lab 06/30/24 0849 06/30/24 1202 06/30/24 1618 06/30/24 2116 07/01/24 0835  GLUCAP 102* 181* 119* 232* 95    Discharge time spent: greater than 30 minutes.  This record has been created using Conservation officer, historic buildings. Errors have been sought and corrected,but may not always be located. Such creation errors do not reflect on the standard of care.   Signed: Amaryllis Dare, MD [facility] 07/01/2024

## 2024-07-01 NOTE — Plan of Care (Signed)
   Problem: Education: Goal: Knowledge of General Education information will improve Description Including pain rating scale, medication(s)/side effects and non-pharmacologic comfort measures Outcome: Progressing

## 2024-07-03 ENCOUNTER — Other Ambulatory Visit: Payer: Self-pay

## 2024-07-03 ENCOUNTER — Telehealth: Payer: Self-pay | Admitting: Nutrition

## 2024-07-03 DIAGNOSIS — R748 Abnormal levels of other serum enzymes: Secondary | ICD-10-CM

## 2024-07-03 DIAGNOSIS — E876 Hypokalemia: Secondary | ICD-10-CM

## 2024-07-03 NOTE — Telephone Encounter (Signed)
 Message left on my machine that patient would like to speak to Dr. Kris nurse about stopping her Trulicity .  She was told to do this by someone at the hospital.  She is to take this on Thursday.

## 2024-07-04 ENCOUNTER — Other Ambulatory Visit: Payer: Self-pay | Admitting: Family Medicine

## 2024-07-04 DIAGNOSIS — K2900 Acute gastritis without bleeding: Secondary | ICD-10-CM

## 2024-07-05 ENCOUNTER — Ambulatory Visit: Admitting: Medical

## 2024-07-05 ENCOUNTER — Encounter: Payer: Self-pay | Admitting: Medical

## 2024-07-05 ENCOUNTER — Telehealth: Payer: Self-pay | Admitting: Medical

## 2024-07-05 ENCOUNTER — Ambulatory Visit: Payer: Self-pay | Admitting: Medical

## 2024-07-05 ENCOUNTER — Ambulatory Visit (HOSPITAL_BASED_OUTPATIENT_CLINIC_OR_DEPARTMENT_OTHER)
Admission: RE | Admit: 2024-07-05 | Discharge: 2024-07-05 | Disposition: A | Discharge status: Home / Self Care | Source: Ambulatory Visit | Attending: Medical | Admitting: Medical

## 2024-07-05 VITALS — BP 118/60 | HR 79 | Temp 97.5°F | Resp 16 | Ht 61.0 in | Wt 180.4 lb

## 2024-07-05 DIAGNOSIS — R1030 Lower abdominal pain, unspecified: Secondary | ICD-10-CM | POA: Insufficient documentation

## 2024-07-05 DIAGNOSIS — R300 Dysuria: Secondary | ICD-10-CM | POA: Diagnosis not present

## 2024-07-05 DIAGNOSIS — K5900 Constipation, unspecified: Secondary | ICD-10-CM | POA: Diagnosis not present

## 2024-07-05 DIAGNOSIS — K859 Acute pancreatitis without necrosis or infection, unspecified: Secondary | ICD-10-CM | POA: Diagnosis not present

## 2024-07-05 DIAGNOSIS — K5909 Other constipation: Secondary | ICD-10-CM

## 2024-07-05 DIAGNOSIS — Z7984 Long term (current) use of oral hypoglycemic drugs: Secondary | ICD-10-CM | POA: Diagnosis not present

## 2024-07-05 DIAGNOSIS — N183 Chronic kidney disease, stage 3 unspecified: Secondary | ICD-10-CM | POA: Diagnosis not present

## 2024-07-05 DIAGNOSIS — Z8679 Personal history of other diseases of the circulatory system: Secondary | ICD-10-CM

## 2024-07-05 DIAGNOSIS — E119 Type 2 diabetes mellitus without complications: Secondary | ICD-10-CM | POA: Diagnosis not present

## 2024-07-05 DIAGNOSIS — R600 Localized edema: Secondary | ICD-10-CM | POA: Diagnosis not present

## 2024-07-05 DIAGNOSIS — R748 Abnormal levels of other serum enzymes: Secondary | ICD-10-CM | POA: Diagnosis not present

## 2024-07-05 DIAGNOSIS — R109 Unspecified abdominal pain: Secondary | ICD-10-CM | POA: Diagnosis not present

## 2024-07-05 NOTE — Patient Instructions (Signed)
 Acute pancreatitis likely GLP-1 agonist-induced Acute pancreatitis likely induced by GLP-1 agonist Trulicity . Persistent abdominal pain with inflammatory changes at the head of the duct. Trulicity  discontinued. Caution with GLP-1 agonists due to pancreatitis risk. Advised conservative diet and alcohol  avoidance. - Discontinue Trulicity . - Follow a conservative low-fat, low-fried food diet, bland foods, and hydrate well. - Avoid alcohol  consumption. - Order lipase level to monitor pancreatic enzyme. - Refer to endocrinologist for further management of diabetes medications. Continue jardiance . - Refer to gastroenterologist for further evaluation of pancreatitis.  Constipation- - Order abdominal x-ray to assess for constipation-related pain. Likely advise miralax . - Order urine culture to rule out infection.  Type 2 diabetes mellitus, suboptimally controlled Suboptimal control with fluctuating blood sugar levels. A1c previously 6.8%. Trulicity  discontinued. Endocrinologist to evaluate alternative medications considering GLP-1 agonist history and renal safety. - Continue Jardiance  10 mg daily. - Continue Glucotrol  5 mg daily. - Refer to endocrinologist for evaluation of diabetes management and potential alternative medications.  Chronic kidney disease stage 3 Chronic kidney disease stage 3. Metformin  not recommended. Monitoring current medications for renal safety. - Monitor kidney function with metabolic panel.  Chronic heart failure with chronic troponin elevation Chronic heart failure with chronic troponin elevation. No chest pain or cardiac symptoms presently. Advised emergency care if chest pain recurs and lasts more than five minutes. - Advise to go to the emergency department if chest pain recurs and lasts more than five minutes. - Continue current heart failure management.  First degree atrioventricular (AV) block First degree AV block on EKG with no dropped beats. Regular rhythm.  Cardiologist to review EKG findings. - Follow up with cardiologist for further evaluation of EKG findings.  Constipation Constipation with last bowel movement on Monday. Abdominal pain may be related. No bowel movement for two days. - Order abdominal x-ray to assess for constipation. - Advise to take Miralax  if x-ray confirms constipation.  Follow up date to be determined after lab and imaging review.

## 2024-07-05 NOTE — Progress Notes (Signed)
 Subjective:    Patient ID: Donna Fuller, female    DOB: May 06, 1941, 83 y.o.   MRN: [id number]  HPI    Admit date:     06/29/2024  Discharge date: 07/01/24  Discharge Physician: Amaryllis Dare    PCP: Dorina Loving, PA-C    Recommendations at discharge:  Please obtain CBC and CMP and follow-up Please consider transitioning from Trulicity , likely Ozempic as we could not find any other risk factor for her pancreatitis Follow-up with primary care provider within a week  Discharge Diagnoses: Principal Problem:   Acute pancreatitis Active Problems:   Elevated troponin   Coronary artery disease, non-occlusive   Abnormal EKG   Hypertension   Stage 3b chronic kidney disease (HCC)   Diabetes mellitus type 2, controlled (HCC)   Hyperlipidemia   GERD (gastroesophageal reflux disease)   Hospital Course:   Donna Fuller is a 83 y.o. female with medical history significant of class I obesity, osteoarthritis, right breast cancer, status postmastectomy, chronic diastolic CHF, LVH, type 2 diabetes, GERD, gout, hyperlipidemia, hypertension, spinal stenosis, peripheral neuropathy, diabetic retinopathy, glaucoma who presented to the emergency department complaints of abdominal pain for the past 8 days after she ate fried chicken.    On presentation stable vitals, labs with lipase of 115, creatinine 1.41, T. bili 1.5, troponin 127>>139 CT abdomen and pelvis consistent with acute pancreatitis, no definite pseudocyst.   Patient was started on pain management and supportive care.   8/1: Hemodynamically stable, some improvement of creatinine to 1.16, T. bili normalized, lipase at 129.  Triglyceride normal at 133. Patient does not drink alcohol  and no recent change in medications except adding diclofenac but her symptoms started before taking that.  Patient was on Trulicity  at home which has some data showing it may contribute to pancreatitis, pharmacy is suggesting outpatient transition to  Ozempic. Advancing diet to full liquid today.   8/2: Remained hemodynamically stable with improving lipase now at 87.  Able to tolerate soft diet now.  No more nausea or vomiting.  Pain with significant improvement and no more tenderness.   Patient will continue her home medication and need to discuss with her primary care provider regarding transitioning from Trulicity .   She was given some Percocet to use as needed for severe pain.   Patient will follow-up with her providers for further assistance.   Assessment and Plan: * Acute pancreatitis Symptoms started after eating some fried chicken, no other significant risk factors.  Patient was on Trulicity  which sometimes can be contributory so pharmacy is recommending outpatient transition to Ozempic. Improving lipase and pain   Elevated troponin No chest pain, seems like having chronically mildly elevated troponin.   Coronary artery disease, non-occlusive No chest pain. - Continue home aspirin  -Statin need to be added by PCP once recovered from current illness   Abnormal EKG Seems nonacute first-degree heart block and nonspecific ST changes.   Hypertension Blood pressure within goal -Continue home losartan    Stage 3b chronic kidney disease (HCC) Renal function seems to be around baseline. Monitor renal function- -avoid nephrotoxins   Diabetes mellitus type 2, controlled (HCC) Continue home Jardiance  -SSI   GERD (gastroesophageal reflux disease) - Continue PPI   Consultants: None Procedures performed: None Disposition: Home Diet recommendation:  Discharge Diet Orders (From admission, onward)     Medication List       TAKE these medications     acetaminophen  650 MG CR tablet Commonly known as: TYLENOL  Take 650 mg by  mouth 2 (two) times daily.    albuterol  108 (90 Base) MCG/ACT inhaler Commonly known as: VENTOLIN  HFA INHALE 2 PUFFS BY MOUTH EVERY 6 HOURS AS NEEDED    Alcohol  Prep Pad 70 % Pads Use to  check blood sugars    Amvuttra  25 MG/0.5ML syringe Generic drug: vutrisiran  sodium Inject 0.5 mLs (25 mg total) into the skin every 3 (three) months.    ascorbic acid 1000 MG tablet Commonly known as: VITAMIN C Take 1,000 mg by mouth daily.    aspirin  EC 81 MG tablet Take 1 tablet (81 mg total) by mouth daily. Swallow whole.    Azelastine HCl 137 MCG/SPRAY Soln SMARTSIG:1-2 Spray(s) Both Nares Twice Daily    B-12 PO Take 1 tablet by mouth daily.    blood glucose meter kit and supplies Kit Dispense based on patient and insurance preference. Use up to four times daily as directed. (FOR ICD-9 250.00, 250.01).    brimonidine  0.2 % ophthalmic solution Commonly known as: ALPHAGAN  Place 1 drop into both eyes 3 (three) times daily.    bumetanide  1 MG tablet Commonly known as: BUMEX  Take 2 mg by mouth daily. What changed:  how much to take when to take this    dicyclomine  10 MG capsule Commonly known as: BENTYL  Take 1 tab every 6 hours as needed for abdominal cramping.    empagliflozin  10 MG Tabs tablet Commonly known as: Jardiance  Take 1 tablet (10 mg total) by mouth daily before breakfast.    ezetimibe  10 MG tablet Commonly known as: ZETIA  TAKE 1 TABLET BY MOUTH EVERY DAY    fluticasone  50 MCG/ACT nasal spray Commonly known as: FLONASE  SPRAY 2 SPRAYS INTO EACH NOSTRIL EVERY DAY    gabapentin  300 MG capsule Commonly known as: NEURONTIN  TAKE 1 CAPSULE BY MOUTH THREE TIMES A DAY    glipiZIDE  5 MG tablet Commonly known as: GLUCOTROL  Take 1 tablet (5 mg total) by mouth 2 (two) times daily before a meal.    ketorolac  0.5 % ophthalmic solution Commonly known as: ACULAR  INSTILL 1 DROP INTO RIGHT EYE 4 TIMES A DAY    LANACANE EX Apply 1 application  topically daily as needed. Roll-on daily as needed for knee pain.    levocetirizine 5 MG tablet Commonly known as: XYZAL  TAKE 1 TABLET BY MOUTH EVERY DAY IN THE EVENING    losartan  25 MG tablet Commonly known as:  COZAAR  Take 0.5 tablets (12.5 mg total) by mouth daily.    omeprazole  40 MG capsule Commonly known as: PRILOSEC Take 1 capsule (40 mg total) by mouth in the morning and at bedtime.    ondansetron  4 MG disintegrating tablet Commonly known as: ZOFRAN -ODT Take 1 tablet (4 mg total) by mouth every 8 (eight) hours as needed for nausea or vomiting.    OneTouch Delica Lancets 33G Misc Use as instructed to check blood sugar 2-3 times a day.  DX E11.9    OneTouch Ultra Test test strip Generic drug: glucose blood CHECK BLOOD SUGAR 2 TO 3 TIMES DAILY AS DIRECTED    oxyCODONE -acetaminophen  5-325 MG tablet Commonly known as: Percocet Take 1 tablet by mouth every 6 (six) hours as needed for severe pain (pain score 7-10).    polyethylene glycol 17 g packet Commonly known as: MIRALAX  / GLYCOLAX  Take 17 g by mouth daily. Start taking on: July 02, 2024    timolol  0.5 % ophthalmic solution Commonly known as: TIMOPTIC  Place 1 drop into both eyes 2 (two) times a day.  Trulicity  1.5 MG/0.5ML Soaj Generic drug: Dulaglutide  INJECT 1.5 MG (0.5ML) UNDER THE SKIN ONCE A WEEK    Vitamin A  2250 MCG (7500 UT) Caps Take 1 capsule by mouth daily. You can find this over the counter.    VITAMIN D-3 PO Take 2,000 Units by mouth daily.    Vyzulta 0.024 % Soln Generic drug: Latanoprostene Bunod Apply 1 drop to eye at bedtime.             Follow-up Information       Jackelin Correia, PA-C. Schedule an appointment as soon as possible for a visit in 1 week(s).   Specialties: Internal Medicine, Family Medicine Contact information: [address] [place] [address] [phone]      Donna Fuller is an 83 year old female with diabetes who presents for a hospital follow-up after an episode of acute pancreatitis.  She was admitted to the hospital on July 31st and discharged on August 2nd with a diagnosis of acute pancreatitis. She reports that she was told to discontinue  Trulicity , as it may have contributed to her pancreatitis. She discontinued Trulicity . She has a history of using Ozempic, another GLP-1 receptor agonist, but was taken off it approximately five years ago for reasons she cannot recall.  Since her discharge, she has experienced persistent abdominal pain across her lower abdomen, radiating to her back. The pain was more severe yesterday than today. She was discharged with oxycodone  for pain management but has been hesitant to take it. Her granddaughter suggested taking half a tablet, but she has not yet done so. No significant improvement in pain since discharge. She has been avoiding fatty and fried foods since her discharge and her last full meal was on Sunday. She is also taking omeprazole  for reflux management.  She has been monitoring her blood sugar levels, which have fluctuated since her hospital discharge. On Monday, her blood sugar was 224 mg/dL, and it was 883 mg/dL today. She is currently on Jardiance  10 mg daily and Glucotrol  (glipizide ) 5 mg. She has not taken metformin  due to potential kidney function concerns.  She reports a history of chronic mildly elevated troponin levels, which complicates the assessment when ever has chest pain. She experienced chest pain yesterday, which resolved by 10 PM, and has no current chest pain nor any associated cardiac signs or symptoms. She has a history of chronic kidney disease, stage 3, and is under the care of a nephrologist, although she has not seen her in six months.  She has not had a bowel movement since Monday, which is causing concern for constipation.     Review of Systems  Constitutional:  Negative for chills, fatigue and fever.  Respiratory:  Negative for cough, chest tightness and stridor.   Cardiovascular:  Negative for chest pain and palpitations.  Gastrointestinal:  Positive for abdominal pain. Negative for abdominal distention, blood in stool, diarrhea, nausea and vomiting.   Endocrine: Negative for polydipsia and polyuria.  Genitourinary:  Negative for dysuria, flank pain, frequency, pelvic pain and urgency.  Musculoskeletal:  Negative for back pain.  Skin:  Negative for rash.  Neurological:  Negative for facial asymmetry and numbness.  Hematological:  Negative for adenopathy.    Past Medical History:  Diagnosis Date   Arthritis    Breast cancer [facility]) 2007   Right breast, s/p mastectomy   Chronic diastolic congestive heart failure (HCC)    per patient, dx by cardiology   Diabetes mellitus without complication (  HCC)    GERD (gastroesophageal reflux disease)    Gout    Hyperlipidemia    Hypertension    Spinal stenosis      Social History   Socioeconomic History   Marital status: Married    Spouse name: Jimmy   Number of children: 3   Years of education: 12   Highest education level: Not on file  Occupational History   Occupation: Retired  Tobacco Use   Smoking status: Former    Types: Cigarettes    Passive exposure: Never   Smokeless tobacco: Never  Vaping Use   Vaping status: Never Used  Substance and Sexual Activity   Alcohol  use: No   Drug use: No   Sexual activity: Not Currently  Other Topics Concern   Not on file  Social History Narrative   Marital Status:  Married Hydrologist)    Children:  Daughter(1) Son (1)    Pets:  None    Living Situation: Lives with husband and daughter.     Occupation:  Retired Contractor)    Education: 12th Grade    Tobacco Use/Exposure:  She used to smoke socially during the weekends but quit 40 years ago.    Alcohol  Use:  Occasional   Drug Use:  None   Diet:  Regular   Exercise:  None   Hobbies:  Reading and playing volleyball             Social Drivers of Health   Financial Resource Strain: Low Risk  (04/11/2024)   Overall Financial Resource Strain (CARDIA)    Difficulty of Paying Living Expenses: Not hard at all  Food Insecurity: No Food Insecurity (06/29/2024)   Hunger Vital Sign    Worried  About Running Out of Food in the Last Year: Never true    Ran Out of Food in the Last Year: Never true  Transportation Needs: No Transportation Needs (06/29/2024)   PRAPARE - Administrator, Civil Service (Medical): No    Lack of Transportation (Non-Medical): No  Physical Activity: Inactive (04/11/2024)   Exercise Vital Sign    Days of Exercise per Week: 0 days    Minutes of Exercise per Session: 0 min  Stress: No Stress Concern Present (04/11/2024)   [employer] of Occupational Health - Occupational Stress Questionnaire    Feeling of Stress : Not at all  Social Connections: Moderately Integrated (06/29/2024)   Social Connection and Isolation Panel    Frequency of Communication with Friends and Family: More than three times a week    Frequency of Social Gatherings with Friends and Family: More than three times a week    Attends Religious Services: More than 4 times per year    Active Member of [employer] or Organizations: No    Attends Banker Meetings: Never    Marital Status: Married  Catering manager Violence: Not At Risk (06/29/2024)   Humiliation, Afraid, Rape, and Kick questionnaire    Fear of Current or Ex-Partner: No    Emotionally Abused: No    Physically Abused: No    Sexually Abused: No    Past Surgical History:  Procedure Laterality Date   ABDOMINAL HYSTERECTOMY     ABDOMINAL SURGERY     BREAST BIOPSY Left    2016 benign    BREAST BIOPSY Left 02/16/2023   MM LT BREAST BX W LOC DEV 1ST LESION IMAGE BX SPEC STEREO GUIDE 02/16/2023 [facility]MO[facility]   LEFT HEART CATH AND CORONARY ANGIOGRAPHY N/A  11/21/2021   Procedure: LEFT HEART CATH AND CORONARY ANGIOGRAPHY;  Surgeon: Darron Deatrice LABOR, MD;  Location: [facility] INVASIVE CV LAB;  Service: Cardiovascular;  Laterality: N/A;   MASTECTOMY     right side   REPLACEMENT TOTAL KNEE Bilateral    TUBAL LIGATION      Family History  Problem Relation Age of Onset   Diabetes Mother    Alzheimer's disease  Mother    Hypertension Mother    Hypertension Father    Heart disease Father    Hyperlipidemia Father    Kidney disease Father    Diabetes Sister    Cancer Brother        lung   Alzheimer's disease Brother    Heart disease Brother    Hypertension Brother    Diabetes Brother    Hypertension Brother    Diabetes Brother    Hyperlipidemia Son    Hypertension Son    Diabetes Son    Hypertension Daughter    Hyperlipidemia Daughter     Allergies  Allergen Reactions   Penicillins Swelling   Sulfa Antibiotics Swelling   Amlodipine Swelling    LE Swelling   Lisinopril Cough    Current Outpatient Medications on File Prior to Visit  Medication Sig Dispense Refill   acetaminophen  (TYLENOL ) 650 MG CR tablet Take 650 mg by mouth 2 (two) times daily.     albuterol  (VENTOLIN  HFA) 108 (90 Base) MCG/ACT inhaler INHALE 2 PUFFS BY MOUTH EVERY 6 HOURS AS NEEDED 8.5 each 2   Alcohol  Swabs (ALCOHOL  PREP PAD) 70 % PADS Use to check blood sugars 100 each 3   ascorbic acid (VITAMIN C) 1000 MG tablet Take 1,000 mg by mouth daily.     aspirin  EC 81 MG tablet Take 1 tablet (81 mg total) by mouth daily. Swallow whole. 90 tablet 3   Azelastine HCl 137 MCG/SPRAY SOLN SMARTSIG:1-2 Spray(s) Both Nares Twice Daily     Benzocaine-Benzethonium (LANACANE EX) Apply 1 application  topically daily as needed. Roll-on daily as needed for knee pain.     blood glucose meter kit and supplies KIT Dispense based on patient and insurance preference. Use up to four times daily as directed. (FOR ICD-9 250.00, 250.01). 1 each 11   brimonidine  (ALPHAGAN ) 0.2 % ophthalmic solution Place 1 drop into both eyes 3 (three) times daily.     bumetanide  (BUMEX ) 1 MG tablet Take 2 mg by mouth daily. (Patient taking differently: Take 1 mg by mouth 2 (two) times daily.)     Cholecalciferol (VITAMIN D-3 PO) Take 2,000 Units by mouth daily.     Cyanocobalamin  (B-12 PO) Take 1 tablet by mouth daily.     dicyclomine  (BENTYL ) 10 MG capsule  TAKE 1 TAB EVERY 6 HOURS AS NEEDED FOR ABDOMINAL CRAMPING. 360 capsule 1   Dulaglutide  (TRULICITY ) 1.5 MG/0.5ML SOAJ INJECT 1.5 MG (0.5ML) UNDER THE SKIN ONCE A WEEK 6 mL 3   empagliflozin  (JARDIANCE ) 10 MG TABS tablet Take 1 tablet (10 mg total) by mouth daily before breakfast. 30 tablet 5   ezetimibe  (ZETIA ) 10 MG tablet TAKE 1 TABLET BY MOUTH EVERY DAY 90 tablet 3   fluticasone  (FLONASE ) 50 MCG/ACT nasal spray SPRAY 2 SPRAYS INTO EACH NOSTRIL EVERY DAY 48 mL 1   gabapentin  (NEURONTIN ) 300 MG capsule TAKE 1 CAPSULE BY MOUTH THREE TIMES A DAY 270 capsule 1   glipiZIDE  (GLUCOTROL ) 5 MG tablet Take 1 tablet (5 mg total) by mouth 2 (two) times daily before a meal.  180 tablet 3   ketorolac  (ACULAR ) 0.5 % ophthalmic solution INSTILL 1 DROP INTO RIGHT EYE 4 TIMES A DAY 5 mL 6   levocetirizine (XYZAL ) 5 MG tablet TAKE 1 TABLET BY MOUTH EVERY DAY IN THE EVENING 90 tablet 1   losartan  (COZAAR ) 25 MG tablet Take 0.5 tablets (12.5 mg total) by mouth daily. 45 tablet 3   omeprazole  (PRILOSEC) 40 MG capsule Take 1 capsule (40 mg total) by mouth in the morning and at bedtime. 180 capsule 0   ondansetron  (ZOFRAN -ODT) 4 MG disintegrating tablet Take 1 tablet (4 mg total) by mouth every 8 (eight) hours as needed for nausea or vomiting. 20 tablet 0   OneTouch Delica Lancets 33G MISC Use as instructed to check blood sugar 2-3 times a day.  DX E11.9 100 each 3   ONETOUCH ULTRA TEST test strip CHECK BLOOD SUGAR 2 TO 3 TIMES DAILY AS DIRECTED 100 strip 5   oxyCODONE -acetaminophen  (PERCOCET) 5-325 MG tablet Take 1 tablet by mouth every 6 (six) hours as needed for severe pain (pain score 7-10). 20 tablet 0   polyethylene glycol (MIRALAX  / GLYCOLAX ) 17 g packet Take 17 g by mouth daily. 14 each 0   timolol  (TIMOPTIC ) 0.5 % ophthalmic solution Place 1 drop into both eyes 2 (two) times a day.     Vitamin A  2250 MCG (7500 UT) CAPS Take 1 capsule by mouth daily. You can find this over the counter. 30 capsule 11   vutrisiran   sodium (AMVUTTRA ) 25 MG/0.5ML syringe Inject 0.5 mLs (25 mg total) into the skin every 3 (three) months. 0.5 mL 3   VYZULTA 0.024 % SOLN Apply 1 drop to eye at bedtime.     No current facility-administered medications on file prior to visit.    BP 118/60   Pulse 79   Temp (!) 97.5 F (36.4 C) (Oral)   Resp 16   Ht 5' 1 (1.549 m)   Wt 180 lb 6.4 oz (81.8 kg)   SpO2 96%   BMI 34.09 kg/m        Objective:   Physical Exam  General Mental Status- Alert. General Appearance- Not in acute distress.   Skin General: Color- Normal Color. Moisture- Normal Moisture.  Neck Carotid Arteries- Normal color. Moisture- Normal Moisture. No carotid bruits. No JVD.  Chest and Lung Exam Auscultation: Breath Sounds:-Normal.  Cardiovascular Auscultation:Rythm- Regular. Murmurs & Other Heart Sounds:Auscultation of the heart reveals- No Murmurs.  Abdomen Inspection:-Inspeection Normal. Palpation/Percussion:Note:No mass. Palpation and Percussion of the abdomen reveal- mild lower abd  Tender, Non Distended + BS, no rebound or guarding.   Neurologic Cranial Nerve exam:- CN III-XII intact(No nystagmus), symmetric smile. Strength:- 5/5 equal and symmetric strength both upper and lower extremities.    Lower ext- calfs symmetric, negative homans signs, no pedal edema.     Assessment & Plan:   Patient Instructions  Acute pancreatitis likely GLP-1 agonist-induced Acute pancreatitis likely induced by GLP-1 agonist Trulicity . Persistent abdominal pain with inflammatory changes at the head of the duct. Trulicity  discontinued. Caution with GLP-1 agonists due to pancreatitis risk. Advised conservative diet and alcohol  avoidance. - Discontinue Trulicity . - Follow a conservative low-fat, low-fried food diet, bland foods, and hydrate well. - Avoid alcohol  consumption. - Order lipase level to monitor pancreatic enzyme. - Refer to endocrinologist for further management of diabetes medications.  Continue jardiance . - Refer to gastroenterologist for further evaluation of pancreatitis.  Constipation- - Order abdominal x-ray to assess for constipation-related pain. Likely advise miralax . -  Order urine culture to rule out infection.  Type 2 diabetes mellitus, suboptimally controlled Suboptimal control with fluctuating blood sugar levels. A1c previously 6.8%. Trulicity  discontinued. Endocrinologist to evaluate alternative medications considering GLP-1 agonist history and renal safety. - Continue Jardiance  10 mg daily. - Continue Glucotrol  5 mg daily. - Refer to endocrinologist for evaluation of diabetes management and potential alternative medications.  Chronic kidney disease stage 3 Chronic kidney disease stage 3. Metformin  not recommended. Monitoring current medications for renal safety. - Monitor kidney function with metabolic panel.  Chronic heart failure with chronic troponin elevation Chronic heart failure with chronic troponin elevation. No chest pain or cardiac symptoms presently. Advised emergency care if chest pain recurs and lasts more than five minutes. - Advise to go to the emergency department if chest pain recurs and lasts more than five minutes. - Continue current heart failure management.  First degree atrioventricular (AV) block First degree AV block on EKG with no dropped beats. Regular rhythm. Cardiologist to review EKG findings. - Follow up with cardiologist for further evaluation of EKG findings.  Constipation Constipation with last bowel movement on Monday. Abdominal pain may be related. No bowel movement for two days. - Order abdominal x-ray to assess for constipation. - Advise to take Miralax  if x-ray confirms constipation.  Follow up date to be determined after lab and imaging review.   Gennavieve Huq, PA-C   Time spent with patient today was 44  minutes which consisted of chart review, discussing diagnosis, work up treatment and documentation.

## 2024-07-05 NOTE — Telephone Encounter (Signed)
 Why wasn't  her poct ua done? I had to delete after hours to close chart. If she did not give sample then she needs to come back to give sample and make sure culture sent out.

## 2024-07-06 ENCOUNTER — Encounter: Payer: Self-pay | Admitting: Medical

## 2024-07-06 ENCOUNTER — Telehealth: Payer: Self-pay

## 2024-07-06 LAB — CBC WITH DIFFERENTIAL/PLATELET
Basophils Absolute: 0 K/uL (ref 0.0–0.1)
Basophils Relative: 0.7 % (ref 0.0–3.0)
Eosinophils Absolute: 0.1 K/uL (ref 0.0–0.7)
Eosinophils Relative: 1.5 % (ref 0.0–5.0)
HCT: 39.9 % (ref 36.0–46.0)
Hemoglobin: 13 g/dL (ref 12.0–15.0)
Lymphocytes Relative: 22.4 % (ref 12.0–46.0)
Lymphs Abs: 1.5 K/uL (ref 0.7–4.0)
MCHC: 32.6 g/dL (ref 30.0–36.0)
MCV: 93.5 fl (ref 78.0–100.0)
Monocytes Absolute: 0.5 K/uL (ref 0.1–1.0)
Monocytes Relative: 7 % (ref 3.0–12.0)
Neutro Abs: 4.4 K/uL (ref 1.4–7.7)
Neutrophils Relative %: 68.4 % (ref 43.0–77.0)
Platelets: 206 K/uL (ref 150.0–400.0)
RBC: 4.27 Mil/uL (ref 3.87–5.11)
RDW: 15.2 % (ref 11.5–15.5)
WBC: 6.5 K/uL (ref 4.0–10.5)

## 2024-07-06 LAB — COMPREHENSIVE METABOLIC PANEL WITH GFR
ALT: 14 U/L (ref 0–35)
AST: 18 U/L (ref 0–37)
Albumin: 4.1 g/dL (ref 3.5–5.2)
Alkaline Phosphatase: 168 U/L — ABNORMAL HIGH (ref 39–117)
BUN: 31 mg/dL — ABNORMAL HIGH (ref 6–23)
CO2: 26 meq/L (ref 19–32)
Calcium: 9.7 mg/dL (ref 8.4–10.5)
Chloride: 97 meq/L (ref 96–112)
Creatinine, Ser: 1.65 mg/dL — ABNORMAL HIGH (ref 0.40–1.20)
GFR: 28.76 mL/min — ABNORMAL LOW (ref >60.00)
Glucose, Bld: 166 mg/dL — ABNORMAL HIGH (ref 70–99)
Potassium: 4.2 meq/L (ref 3.5–5.1)
Sodium: 141 meq/L (ref 135–145)
Total Bilirubin: 0.4 mg/dL (ref 0.2–1.2)
Total Protein: 7.2 g/dL (ref 6.0–8.3)

## 2024-07-06 LAB — URINE CULTURE
MICRO NUMBER:: 16794957
Result:: NO GROWTH
SPECIMEN QUALITY:: ADEQUATE

## 2024-07-06 LAB — LIPASE: Lipase: 131 U/L — ABNORMAL HIGH (ref 11.0–59.0)

## 2024-07-06 NOTE — Telephone Encounter (Signed)
 Patient notified and agrees with the plan

## 2024-07-06 NOTE — Telephone Encounter (Signed)
Urine culture was sent out.

## 2024-07-06 NOTE — Addendum Note (Signed)
 Addended by: DORINA DALLAS HERO on: 07/06/2024 07:28 PM   Modules accepted: Orders

## 2024-07-06 NOTE — Telephone Encounter (Signed)
 Patient was in the hospital on 06/29/24 for pancreatitis and was advise that she needs to stop the Trulicity . Patient wants to know if she needs to have the Trulicity  replace with another medication.

## 2024-07-07 ENCOUNTER — Other Ambulatory Visit: Payer: Self-pay | Admitting: Medical

## 2024-07-09 ENCOUNTER — Other Ambulatory Visit: Payer: Self-pay | Admitting: Cardiology

## 2024-07-09 ENCOUNTER — Other Ambulatory Visit: Payer: Self-pay | Admitting: Family

## 2024-07-10 ENCOUNTER — Other Ambulatory Visit: Payer: Self-pay | Admitting: Medical

## 2024-07-19 ENCOUNTER — Encounter: Payer: Self-pay | Admitting: Medical

## 2024-07-27 ENCOUNTER — Encounter: Payer: Self-pay | Admitting: Medical

## 2024-07-27 NOTE — Telephone Encounter (Signed)
 Spoke with patient and advised per message on 07/06/24 again and patient will come in on 08/11/24 for follow up

## 2024-08-01 ENCOUNTER — Other Ambulatory Visit

## 2024-08-11 ENCOUNTER — Encounter: Payer: Self-pay | Admitting: Internal Medicine

## 2024-08-11 ENCOUNTER — Ambulatory Visit (INDEPENDENT_AMBULATORY_CARE_PROVIDER_SITE_OTHER): Admitting: Internal Medicine

## 2024-08-11 VITALS — BP 122/70 | HR 64 | Ht 61.0 in | Wt 177.0 lb

## 2024-08-11 DIAGNOSIS — N760 Acute vaginitis: Secondary | ICD-10-CM

## 2024-08-11 DIAGNOSIS — N1832 Chronic kidney disease, stage 3b: Secondary | ICD-10-CM

## 2024-08-11 DIAGNOSIS — Z794 Long term (current) use of insulin: Secondary | ICD-10-CM | POA: Diagnosis not present

## 2024-08-11 DIAGNOSIS — E1142 Type 2 diabetes mellitus with diabetic polyneuropathy: Secondary | ICD-10-CM

## 2024-08-11 DIAGNOSIS — E1122 Type 2 diabetes mellitus with diabetic chronic kidney disease: Secondary | ICD-10-CM

## 2024-08-11 LAB — POCT GLYCOSYLATED HEMOGLOBIN (HGB A1C): Hemoglobin A1C: 7.7 % — AB (ref 4.0–5.6)

## 2024-08-11 MED ORDER — GLIPIZIDE 10 MG PO TABS: ORAL_TABLET | ORAL | 3 refills | Status: DC

## 2024-08-11 MED ORDER — FLUCONAZOLE 150 MG PO TABS: 150.0000 mg | ORAL_TABLET | Freq: Once | ORAL | 1 refills | Status: AC

## 2024-08-11 NOTE — Progress Notes (Addendum)
 Name: Donna Fuller  Age/ Sex: 83 y.o., female   MRN/ DOB: [id number], 1941/04/13     PCP: Dorina Dallas RIGGERS   Reason for Endocrinology Evaluation: Type 2 Diabetes Mellitus  Initial Endocrine Consultative Visit: 12/14/2019    PATIENT IDENTIFIER: Donna Fuller is a 83 y.o. female with a past medical history of T2DM, HTN, RA and Dyslipidemia. The patient has followed with Endocrinology clinic since 12/14/2019 for consultative assistance with management of her diabetes.  DIABETIC HISTORY:  Ms. Igoe was diagnosed with T2DM many years ago. She was on metformin  due to low renal function, has been on Januvia , Farxia and insulin  as well. Her hemoglobin A1c has ranged from 6.0 %in 2015, peaking at 8.8% in 2020.  On her initial visit to our clinic her A1c was 8.8%   , she was on Trulicity  and pioglitazone , we added glipizide    Stopped Pioglitazone  03/2022 due to A1c 5.7 %    Trulicity  discontinued July, 2025 due to pancreatitis  SUBJECTIVE:   During the last visit (04/20/2024): A1c 6.8%    Today (08/11/2024): [name] is here for follow-up on diabetes management. She checks her blood sugars 2 times daily.  The patient has not had hypoglycemic episodes since the last clinic visit.   Since her last visit here, she was diagnosed with pancreatitis and Trulicity  was discontinued She follows with Dr. Monna for for seropositive rheumatoid arthritis  She also follows with nephrology for CKD with proteinuria  She also follows with cardiology for CHF, cardiac amyloidosis   Weight has been decreasing  Abdominal pain is improving  Has been tired  No constipation or diarrhea  She is c/o genital infection since discharge from the hospital  HOME DIABETES REGIMEN:  Glipizide  5 mg , BID-patient has been taking 2 tablets twice daily Jardiance  10 mg, 1 tablet daily through cardiology   METER DOWNLOAD SUMMARY:  159- 307  mg/dL     DIABETIC COMPLICATIONS: Microvascular  complications:  Neuropathy  Denies: retinopathy , CKD Last eye exam: Completed 02/15/2024  Macrovascular complications:    Denies: CAD, PVD, CVA     HISTORY:  Past Medical History:  Past Medical History:  Diagnosis Date   Arthritis    Breast cancer (HCC) 2007   Right breast, s/p mastectomy   Chronic diastolic congestive heart failure (HCC)    per patient, dx by cardiology   Diabetes mellitus without complication (HCC)    GERD (gastroesophageal reflux disease)    Gout    Hyperlipidemia    Hypertension    Spinal stenosis    Past Surgical History:  Past Surgical History:  Procedure Laterality Date   ABDOMINAL HYSTERECTOMY     ABDOMINAL SURGERY     BREAST BIOPSY Left    2016 benign    BREAST BIOPSY Left 02/16/2023   MM LT BREAST BX W LOC DEV 1ST LESION IMAGE BX SPEC STEREO GUIDE 02/16/2023 [facility]   LEFT HEART CATH AND CORONARY ANGIOGRAPHY N/A 11/21/2021   Procedure: LEFT HEART CATH AND CORONARY ANGIOGRAPHY;  Surgeon: Darron Deatrice LABOR, MD;  Location: [facility] INVASIVE CV LAB;  Service: Cardiovascular;  Laterality: N/A;   MASTECTOMY     right side   REPLACEMENT TOTAL KNEE Bilateral    TUBAL LIGATION     Social History:  reports that she has quit smoking. Her smoking use included cigarettes. She has never been exposed to tobacco smoke. She has never used smokeless tobacco. She reports that she does not drink alcohol  and  does not use drugs. Family History:  Family History  Problem Relation Age of Onset   Diabetes Mother    Alzheimer's disease Mother    Hypertension Mother    Hypertension Father    Heart disease Father    Hyperlipidemia Father    Kidney disease Father    Diabetes Sister    Cancer Brother        lung   Alzheimer's disease Brother    Heart disease Brother    Hypertension Brother    Diabetes Brother    Hypertension Brother    Diabetes Brother    Hyperlipidemia Son    Hypertension Son    Diabetes Son    Hypertension Daughter     Hyperlipidemia Daughter      HOME MEDICATIONS: Allergies as of 08/11/2024       Reactions   Penicillins Swelling   Sulfa Antibiotics Swelling   Amlodipine Swelling   LE Swelling   Lisinopril Cough   Trulicity  [dulaglutide ] Other (See Comments)   Pancreatitis        Medication List        Accurate as of August 11, 2024  1:50 PM. If you have any questions, ask your nurse or doctor.          acetaminophen  650 MG CR tablet Commonly known as: TYLENOL  Take 650 mg by mouth 2 (two) times daily.   albuterol  108 (90 Base) MCG/ACT inhaler Commonly known as: VENTOLIN  HFA INHALE 2 PUFFS BY MOUTH EVERY 6 HOURS AS NEEDED   Alcohol  Prep Pad 70 % Pads Use to check blood sugars   Amvuttra  25 MG/0.5ML syringe Generic drug: vutrisiran  sodium Inject 0.5 mLs (25 mg total) into the skin every 3 (three) months.   ascorbic acid 1000 MG tablet Commonly known as: VITAMIN C Take 1,000 mg by mouth daily.   aspirin  EC 81 MG tablet Take 1 tablet (81 mg total) by mouth daily. Swallow whole.   Azelastine HCl 137 MCG/SPRAY Soln SMARTSIG:1-2 Spray(s) Both Nares Twice Daily   B-12 PO Take 1 tablet by mouth daily.   blood glucose meter kit and supplies Kit Dispense based on patient and insurance preference. Use up to four times daily as directed. (FOR ICD-9 250.00, 250.01).   brimonidine  0.2 % ophthalmic solution Commonly known as: ALPHAGAN  Place 1 drop into both eyes 3 (three) times daily.   bumetanide  1 MG tablet Commonly known as: BUMEX  TAKE 1 TABLET BY MOUTH TWICE A DAY   dicyclomine  10 MG capsule Commonly known as: BENTYL  TAKE 1 TAB EVERY 6 HOURS AS NEEDED FOR ABDOMINAL CRAMPING.   empagliflozin  10 MG Tabs tablet Commonly known as: Jardiance  Take 1 tablet (10 mg total) by mouth daily before breakfast.   ezetimibe  10 MG tablet Commonly known as: ZETIA  TAKE 1 TABLET BY MOUTH EVERY DAY   fluticasone  50 MCG/ACT nasal spray Commonly known as: FLONASE  SPRAY 2 SPRAYS INTO  EACH NOSTRIL EVERY DAY   gabapentin  300 MG capsule Commonly known as: NEURONTIN  TAKE 1 CAPSULE BY MOUTH THREE TIMES A DAY   glipiZIDE  5 MG tablet Commonly known as: GLUCOTROL  Take 1 tablet (5 mg total) by mouth 2 (two) times daily before a meal.   ketorolac  0.5 % ophthalmic solution Commonly known as: ACULAR  INSTILL 1 DROP INTO RIGHT EYE 4 TIMES A DAY   LANACANE EX Apply 1 application  topically daily as needed. Roll-on daily as needed for knee pain.   levocetirizine 5 MG tablet Commonly known as: XYZAL  TAKE 1 TABLET  BY MOUTH EVERY DAY IN THE EVENING   losartan  25 MG tablet Commonly known as: COZAAR  Take 0.5 tablets (12.5 mg total) by mouth daily.   omeprazole  40 MG capsule Commonly known as: PRILOSEC Take 1 capsule (40 mg total) by mouth in the morning and at bedtime.   ondansetron  4 MG disintegrating tablet Commonly known as: ZOFRAN -ODT Take 1 tablet (4 mg total) by mouth every 8 (eight) hours as needed for nausea or vomiting.   OneTouch Delica Lancets 33G Misc Use as instructed to check blood sugar 2-3 times a day.  DX E11.9   OneTouch Ultra Test test strip Generic drug: glucose blood CHECK BLOOD SUGAR 2 TO 3 TIMES DAILY AS DIRECTED   oxyCODONE -acetaminophen  5-325 MG tablet Commonly known as: Percocet Take 1 tablet by mouth every 6 (six) hours as needed for severe pain (pain score 7-10).   polyethylene glycol 17 g packet Commonly known as: MIRALAX  / GLYCOLAX  Take 17 g by mouth daily.   timolol  0.5 % ophthalmic solution Commonly known as: TIMOPTIC  Place 1 drop into both eyes 2 (two) times a day.   Vitamin A  2250 MCG (7500 UT) Caps Take 1 capsule by mouth daily. You can find this over the counter.   VITAMIN D-3 PO Take 2,000 Units by mouth daily.   Vyzulta 0.024 % Soln Generic drug: Latanoprostene Bunod Apply 1 drop to eye at bedtime.         OBJECTIVE:   Vital Signs: BP 122/70 (BP Location: Left Arm, Patient Position: Sitting, Cuff Size: Normal)    Pulse 64   Ht 5' 1 (1.549 m)   Wt 177 lb (80.3 kg)   SpO2 97%   BMI 33.44 kg/m   Wt Readings from Last 3 Encounters:  08/11/24 177 lb (80.3 kg)  07/05/24 180 lb 6.4 oz (81.8 kg)  06/29/24 176 lb (79.8 kg)     Exam: General: Pt appears well and is in NAD  Lungs: Clear with good BS bilat   Heart: RRR   Extremities: trace pretibial edema.  Neuro: MS is good with appropriate affect, pt is alert and Ox3    DM foot exam:   04/20/2024  The skin of the feet is intact without sores or ulcerations. The pedal pulses are 1+ on right and 1+ on left. The sensation is intact to a screening 5.07, 10 gram monofilament bilaterally    DATA REVIEWED:  Lab Results  Component Value Date   HGBA1C 7.7 (A) 08/11/2024   HGBA1C 6.8 (A) 04/20/2024   HGBA1C 6.8 (A) 10/21/2023   Lab Results  Component Value Date   MICROALBUR 0.4 04/21/2017   LDLCALC 55 08/06/2023   CREATININE 1.65 (H) 07/05/2024     Latest Reference Range & Units 07/05/24 14:10  Comprehensive metabolic panel with GFR  Rpt !  Sodium 135 - 145 mEq/L 141  Potassium 3.5 - 5.1 mEq/L 4.2  Chloride 96 - 112 mEq/L 97  CO2 19 - 32 mEq/L 26  Glucose 70 - 99 mg/dL 833 (H)  BUN 6 - 23 mg/dL 31 (H)  Creatinine 9.59 - 1.20 mg/dL 8.34 (H)  Calcium 8.4 - 10.5 mg/dL 9.7  Alkaline Phosphatase 39 - 117 U/L 168 (H)  Albumin 3.5 - 5.2 g/dL 4.1  AST 0 - 37 U/L 18  ALT 0 - 35 U/L 14  Total Protein 6.0 - 8.3 g/dL 7.2  Total Bilirubin 0.2 - 1.2 mg/dL 0.4  GFR >39.99 mL/min 28.76 (L)     ASSESSMENT / PLAN /  RECOMMENDATIONS:   1) Type 2 Diabetes Mellitus, Sub-optimally controlled, With neuropathic and CKD III complications - Most recent A1c of 7.7%. Goal A1c <7.5%.   - A1c has trended up, this is due to discontinuation of Trulicity  - Stopped  Pioglitazone  due to LE edema/CHF  - Intolerant to Farxiga   - Developed pancreatitis while on Trulicity  - Limited glycemic agents due to CKD - I will increase glipizide  as below - She is on  Jardiance  through cardiology - Caution against hypoglycemia with glipizide   MEDICATIONS: - Stop glipizide  5 mg - Start glipizide  10 mg, 2 tabs before breakfast and 1 tab before supper    EDUCATION / INSTRUCTIONS: BG monitoring instructions: Patient is instructed to check her blood sugars 2 times a day, fasting and suppertime. Call [place] [facility] if: BG persistently < 70  I reviewed the Rule of 15 for the treatment of hypoglycemia in detail with the patient. Literature supplied.    2) Diabetic complications:  Eye: Does not have known diabetic retinopathy.  Neuro/ Feet: Does  have known diabetic peripheral neuropathy. Renal: Patient does not have known baseline CKD. She is  on an ACEI/ARB at present.   3) Acute Vaginitis   - Unclear if this is related to hyperglycemia versus Jardiance  - She did have intolerance to Farxiga  in the past - I will treat her fluconazole  - If symptoms persist, patient will need to follow-up with PCP and discuss Jardiance  with cardiology   Medication Take fluconazole  150 mg x 1    F/U in 3 months    Signed electronically by: Stefano Redgie Butts, MD  [facility]  [facility] [address] [place] [place], [place] [address] Phone: [phone] FAX: [phone]   CC: Dorina Dallas RIGGERS [address] [place] [address] Phone: [phone]  Fax: [phone]  Return to Endocrinology clinic as below: Future Appointments  Date Time Provider Department Center  09/21/2024  1:30 PM [facility] None  02/07/2025 11:00 AM Dolphus Reiter, MD [facility] None  04/17/2025  1:10 PM [facility] VISIT 2 [facility] [address]

## 2024-08-11 NOTE — Patient Instructions (Signed)
 Stop glipizide  5 mg Start glipizide  10 mg, TWO tablets before breakfast and ONE tablet before supper   Take fluconazole  150 mg, 1 tablet for yeast infection, may repeat after 3 days     HOW TO TREAT LOW BLOOD SUGARS (Blood sugar LESS THAN 70 MG/DL) Please follow the RULE OF 15 for the treatment of hypoglycemia treatment (when your (blood sugars are less than 70 mg/dL)   STEP 1: Take 15 grams of carbohydrates when your blood sugar is low, which includes:  3-4 GLUCOSE TABS  OR 3-4 OZ OF JUICE OR REGULAR SODA OR ONE TUBE OF GLUCOSE GEL    STEP 2: RECHECK blood sugar in 15 MINUTES STEP 3: If your blood sugar is still low at the 15 minute recheck --> then, go back to STEP 1 and treat AGAIN with another 15 grams of carbohydrates.

## 2024-08-17 ENCOUNTER — Other Ambulatory Visit: Payer: Self-pay | Admitting: Medical

## 2024-08-17 DIAGNOSIS — E1165 Type 2 diabetes mellitus with hyperglycemia: Secondary | ICD-10-CM

## 2024-08-31 DIAGNOSIS — E113393 Type 2 diabetes mellitus with moderate nonproliferative diabetic retinopathy without macular edema, bilateral: Secondary | ICD-10-CM | POA: Diagnosis not present

## 2024-08-31 DIAGNOSIS — H26492 Other secondary cataract, left eye: Secondary | ICD-10-CM | POA: Diagnosis not present

## 2024-08-31 DIAGNOSIS — H538 Other visual disturbances: Secondary | ICD-10-CM | POA: Diagnosis not present

## 2024-08-31 DIAGNOSIS — H40013 Open angle with borderline findings, low risk, bilateral: Secondary | ICD-10-CM | POA: Diagnosis not present

## 2024-09-07 ENCOUNTER — Other Ambulatory Visit: Payer: Self-pay

## 2024-09-11 ENCOUNTER — Other Ambulatory Visit: Payer: Self-pay

## 2024-09-12 ENCOUNTER — Other Ambulatory Visit (HOSPITAL_COMMUNITY): Payer: Self-pay

## 2024-09-12 ENCOUNTER — Other Ambulatory Visit: Payer: Self-pay

## 2024-09-12 NOTE — Progress Notes (Signed)
 Specialty Pharmacy Refill Coordination Note  Donna Fuller is a 83 y.o. female assessed today regarding refills of clinic administered specialty medication(s) Vutrisiran  Sodium (Amvuttra )   Clinic requested Courier to Provider Office   Delivery date: 09/14/24   Verified address: [address], [place], [place] [address]   Medication will be filled on 09/13/24.

## 2024-09-13 ENCOUNTER — Other Ambulatory Visit (HOSPITAL_COMMUNITY): Payer: Self-pay

## 2024-09-13 ENCOUNTER — Other Ambulatory Visit: Payer: Self-pay

## 2024-09-14 ENCOUNTER — Other Ambulatory Visit: Payer: Self-pay

## 2024-09-14 ENCOUNTER — Other Ambulatory Visit: Payer: Self-pay | Admitting: Medical

## 2024-09-15 NOTE — Progress Notes (Signed)
 Medication has been picked up and is available in office for upcoming appointment.

## 2024-09-18 DIAGNOSIS — H04123 Dry eye syndrome of bilateral lacrimal glands: Secondary | ICD-10-CM | POA: Diagnosis not present

## 2024-09-18 DIAGNOSIS — Z961 Presence of intraocular lens: Secondary | ICD-10-CM | POA: Diagnosis not present

## 2024-09-18 DIAGNOSIS — H401122 Primary open-angle glaucoma, left eye, moderate stage: Secondary | ICD-10-CM | POA: Diagnosis not present

## 2024-09-18 DIAGNOSIS — H401113 Primary open-angle glaucoma, right eye, severe stage: Secondary | ICD-10-CM | POA: Diagnosis not present

## 2024-09-18 DIAGNOSIS — E113393 Type 2 diabetes mellitus with moderate nonproliferative diabetic retinopathy without macular edema, bilateral: Secondary | ICD-10-CM | POA: Diagnosis not present

## 2024-09-18 LAB — HM DIABETES EYE EXAM

## 2024-09-19 ENCOUNTER — Telehealth: Payer: Self-pay | Admitting: Pharmacist

## 2024-09-19 NOTE — Telephone Encounter (Signed)
 Called to confirm/remind patient of their appointment at the [facility] on 09/20/24.   Appointment:   [x] Confirmed  [] Left mess   [] No answer/No voice mail  [] VM Full/unable to leave message  [] Phone not in service  Patient reminded to bring all medications and/or complete list.  Confirmed patient has transportation. Gave directions, instructed to utilize valet parking.

## 2024-09-20 ENCOUNTER — Ambulatory Visit: Attending: Family | Admitting: Pharmacist

## 2024-09-20 VITALS — BP 110/66 | HR 73 | Wt 183.0 lb

## 2024-09-20 DIAGNOSIS — M545 Low back pain, unspecified: Secondary | ICD-10-CM | POA: Diagnosis not present

## 2024-09-20 DIAGNOSIS — E1122 Type 2 diabetes mellitus with diabetic chronic kidney disease: Secondary | ICD-10-CM | POA: Diagnosis not present

## 2024-09-20 DIAGNOSIS — Z7984 Long term (current) use of oral hypoglycemic drugs: Secondary | ICD-10-CM | POA: Insufficient documentation

## 2024-09-20 DIAGNOSIS — G5603 Carpal tunnel syndrome, bilateral upper limbs: Secondary | ICD-10-CM | POA: Insufficient documentation

## 2024-09-20 DIAGNOSIS — N189 Chronic kidney disease, unspecified: Secondary | ICD-10-CM | POA: Diagnosis not present

## 2024-09-20 DIAGNOSIS — I130 Hypertensive heart and chronic kidney disease with heart failure and stage 1 through stage 4 chronic kidney disease, or unspecified chronic kidney disease: Secondary | ICD-10-CM | POA: Insufficient documentation

## 2024-09-20 DIAGNOSIS — Z79899 Other long term (current) drug therapy: Secondary | ICD-10-CM | POA: Insufficient documentation

## 2024-09-20 DIAGNOSIS — I5032 Chronic diastolic (congestive) heart failure: Secondary | ICD-10-CM | POA: Insufficient documentation

## 2024-09-20 DIAGNOSIS — I43 Cardiomyopathy in diseases classified elsewhere: Secondary | ICD-10-CM | POA: Diagnosis not present

## 2024-09-20 DIAGNOSIS — E854 Organ-limited amyloidosis: Secondary | ICD-10-CM

## 2024-09-20 DIAGNOSIS — E1142 Type 2 diabetes mellitus with diabetic polyneuropathy: Secondary | ICD-10-CM | POA: Diagnosis not present

## 2024-09-20 MED ORDER — VUTRISIRAN SODIUM 25 MG/0.5ML ~~LOC~~ SOSY
25.0000 mg | PREFILLED_SYRINGE | Freq: Once | SUBCUTANEOUS | Status: AC
  Administered 2024-09-20: 25 mg via SUBCUTANEOUS

## 2024-09-20 NOTE — Progress Notes (Signed)
 [facility] Note  PCP: Dorina Loving, PA-C PCP-Cardiologist: Evalene Lunger, MD HF-Cardiologist: Ria Commander, DO  HPI:  Donna Fuller is a 83 y.o. 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   Her cardiac history dates back to at least 2022 when she presented to [facility] with complaints of substernal chest pressure.  Patient underwent left heart catheterization that demonstrated only mild nonobstructive CAD of the LAD.  Echocardiogram with moderate to severe LVH and grade 2 diastolic dysfunction with LV ejection fraction of 60 to 65%.  Since that time patient reports she has had a continued decline in her functional status.  She was admitted to [facility] in July and December 2024 with acute on chronic HFpEF exacerbations.  In December 2024 she was diuresed with IV Lasix  and discharged home on torsemide  10 mg daily.  She reports having bilateral carpal tunnel syndrome, peripheral neuropathy and low back pain.     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    Seen by Dr. Commander on 12/15/23. Patient was doing much better after increasing diuretics, able to ambulate and complete ADLs easier. Carvedilol  was decreased to 3.125 mg BID  Seen by Dr. Commander on 01/12/24. Was hypervolemic and bumetanide  was increased to 2mg  BID.  PYP scan 01/2024 showed grade III uptake suggestive of cardiac amyloid.  Seen by Dr. Commander on 02/21/24. Bumex  decreased.  Seen by Dr. Commander on 05/31/24 and Bumex  was increased x 2 days and losartan  was reduced to 12.5 mg daily.  Today Donna Fuller returns to Heart Failure Clinic for pharmacist medication administration. Reports feeling well, aside from some hip pain that she believes is neuropathic. She ambulated into clinic today with a cane, whereas, she used a wheel chair before starting Amvuttra . Denies  shortness of breath, fatigue, orthopnea, and edema. Reports improvement in peripheral neuropathy, though her hip has been troubling her for the past week. She is able to complete all activities of daily living (ADLs). Is somewhat active throughout the day. Weight at home is ~175-178 pounds. Takes bumetanide  2 mg daily. Appetite is good. Somewhat follows a low sodium diet.  Current Heart Failure Medications: Loop diuretic: bumetanide  2 mg per day Beta-Blocker: none ACEI/ARB/ARNI: losartan  12.5 mg daily MRA: none SGLT2i: Jardiance  10 mg daily Other: Amvuttra  25 mg SQ q3mo  Has the patient been experiencing any side effects to the medications prescribed? No  Does the patient have any problems obtaining medications due to transportation or finances? No  Understanding of regimen: Fair  Understanding of indications: Fair  Potential of adherence: Fair  Patient understands to avoid NSAIDs.  Patient understands to avoid decongestants.  Pertinent Lab Values: Creat  Date Value Ref Range Status  06/18/2023 1.90 (H) 0.60 - 0.95 mg/dL Final   Creatinine, Ser  Date Value Ref Range Status  07/05/2024 1.65 (H) 0.40 - 1.20 mg/dL Final   BUN  Date Value Ref Range Status  07/05/2024 31 (H) 6 - 23 mg/dL Final  96/75/7974 36 (H) 8 - 27 mg/dL Final   Potassium  Date Value Ref Range Status  07/05/2024 4.2 3.5 - 5.1 mEq/L Final   Sodium  Date Value Ref Range Status  07/05/2024 141 135 - 145 mEq/L Final  02/21/2024 140 134 - 144 mmol/L Final   Brain Natriuretic Peptide  Date Value Ref Range Status  06/18/2023 364 (H) <100 pg/mL Final    Comment:    .  BNP levels increase with age in the general population with the highest values seen in individuals greater than 104 years of age. Reference: J. Am. Penne. Cardiol. 2002; 59:023-017. .    B Natriuretic Peptide  Date Value Ref Range Status  05/31/2024 441.8 (H) 0.0 - 100.0 pg/mL Final    Comment:    Performed at [facility],  [address] [place], [place] [address]   Magnesium   Date Value Ref Range Status  06/29/2024 2.3 1.7 - 2.4 mg/dL Final    Comment:    Performed at [facility], [address] [place], [place] [address]   TSH  Date Value Ref Range Status  06/18/2023 1.69 0.40 - 4.50 mIU/L Final    Vital Signs: Today's Vitals   09/20/24 1123  BP: 110/66  Pulse: 73  SpO2: 94%  Weight: 183 lb (83 kg)      Assessment/Plan: Cardiac amyloid  - TTE with restrictive features; severe LVH and myocardium that is secondary to cardiac amyloidosis.  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  Continue Bumex  2 mg daily  -MD considering tafamidis since PYP scan was positive, however, doing well currently on Amvuttra  monotherapy. -Given Amvuttra  today for polyneuropathy and cardiac amyloid.     2. HFpEF, restrictive heart disease -Secondary to cardiac amyloid - Will continue Bumex  2 mg daily for now.  - MD considering starting finerenone at follow up. Prior authorization completed and approved. UACR 116 on 08/18/23. Copay is $0 - Continue Jardiance  10 mg daily   3. Peripheral neuropathy - Reports mild sensory deficits in her fingers with carpal tunnel like symptoms.  - Her A1C has ranged from 6 to 6.8 since 2022. - Her polyneuropathy is secondary to cardiac amyloid. Amvuttra  administered in clinic today [facility] [id number], LOT [id number], Exp 04/2026). Tolerated the injection well. Next injection in 3 months.   4. T2DM - A1C from 6.8 to 6 ranging from 2022 to 2024 - Continue Trulicity . If Trulicity  is titrated, may need to decrease glipizide . - Continue Jardiance  10 mg daily.    5. Hypertension  - Continue losartan  50 mg daily   6. CKD -Repeat labs today.  Follow up: With Pharmacy in 3 months for next injection.   Please do not hesitate to reach out with  questions or concerns,  Jaun Bash, PharmD, CPP, BCPS Heart Failure Pharmacist  Phone - [phone] 09/20/2024 11:32 AM

## 2024-09-20 NOTE — Patient Instructions (Signed)
 It was a pleasure seeing you today. We will see you again in 3 months!

## 2024-09-21 ENCOUNTER — Other Ambulatory Visit

## 2024-09-25 ENCOUNTER — Ambulatory Visit: Payer: Self-pay | Admitting: Medical

## 2024-09-25 NOTE — Telephone Encounter (Signed)
  FYI Only or Action Required?: Action required by provider: request for appointment.  Patient was last seen in primary care on 07/05/2024 by Dorina Loving, PA-C.  Called Nurse Triage reporting Hip Pain.  Symptoms began several weeks ago.  Interventions attempted: Nothing.  Symptoms are: gradually worsening.  Triage Disposition: See PCP When Office is Open (Within 3 Days)  Patient/caregiver understands and will follow disposition?: Yes   Copied from CRM #[id number]. Topic: Clinical - Red Word Triage >> Sep 25, 2024  1:48 PM Frederich PARAS wrote: [facility] that prompted transfer to Nurse Triage: extreme pain  nerv on right side, can hardly walk, pt have been n pain for last 2 wks. in extreme pain ,pain is a 10 and swollen it startsin hip and goall the way down to ankle. Answer Assessment - Initial Assessment Questions 1. LOCATION and RADIATION: Where is the pain located? Does the pain spread (shoot) anywhere else?     Right 2. QUALITY: What does the pain feel like?  (e.g., sharp, dull, aching, burning)     Sharp 3. SEVERITY: How bad is the pain? What does it keep you from doing?   (Scale 1-10; or mild, moderate, severe)     10 4. ONSET: When did the pain start? Does it come and go, or is it there all the time?     2 weeks ago 5. WORK OR EXERCISE: Has there been any recent work or exercise that involved this part of the body?      no 6. CAUSE: What do you think is causing the hip pain?      no 7. AGGRAVATING FACTORS: What makes the hip pain worse? (e.g., walking, climbing stairs, running)     walking 8. OTHER SYMPTOMS: Do you have any other symptoms? (e.g., back pain, pain shooting down leg,  fever, rash)     no  Protocols used: Hip Pain-A-AH  Reason for Disposition  [1] MODERATE pain (e.g., interferes with normal activities, limping) AND [2] present > 3 days  Answer Assessment - Initial Assessment Questions 1. LOCATION and RADIATION: Where is the pain located?  Does the pain spread (shoot) anywhere else?     Right 2. QUALITY: What does the pain feel like?  (e.g., sharp, dull, aching, burning)     Sharp 3. SEVERITY: How bad is the pain? What does it keep you from doing?   (Scale 1-10; or mild, moderate, severe)     10 4. ONSET: When did the pain start? Does it come and go, or is it there all the time?     2 weeks ago 5. WORK OR EXERCISE: Has there been any recent work or exercise that involved this part of the body?      no 6. CAUSE: What do you think is causing the hip pain?      no 7. AGGRAVATING FACTORS: What makes the hip pain worse? (e.g., walking, climbing stairs, running)     walking 8. OTHER SYMPTOMS: Do you have any other symptoms? (e.g., back pain, pain shooting down leg,  fever, rash)     no  Protocols used: Hip Pain-A-AH

## 2024-09-26 ENCOUNTER — Ambulatory Visit (INDEPENDENT_AMBULATORY_CARE_PROVIDER_SITE_OTHER): Admitting: Family

## 2024-09-26 VITALS — BP 137/45 | HR 76 | Temp 97.7°F | Resp 16 | Ht 61.0 in | Wt 184.0 lb

## 2024-09-26 DIAGNOSIS — M5386 Other specified dorsopathies, lumbar region: Secondary | ICD-10-CM

## 2024-09-26 DIAGNOSIS — N1832 Chronic kidney disease, stage 3b: Secondary | ICD-10-CM | POA: Diagnosis not present

## 2024-09-26 DIAGNOSIS — E1122 Type 2 diabetes mellitus with diabetic chronic kidney disease: Secondary | ICD-10-CM

## 2024-09-26 DIAGNOSIS — Z794 Long term (current) use of insulin: Secondary | ICD-10-CM

## 2024-09-26 MED ORDER — PEN NEEDLES 32G X 4 MM MISC: 1 refills | Status: DC

## 2024-09-26 MED ORDER — METHYLPREDNISOLONE 4 MG PO TBPK: ORAL_TABLET | ORAL | 0 refills | Status: DC

## 2024-09-26 MED ORDER — INSULIN GLARGINE 100 UNIT/ML SOLOSTAR PEN: PEN_INJECTOR | SUBCUTANEOUS | 2 refills | Status: DC

## 2024-09-26 NOTE — Progress Notes (Signed)
 Subjective:     Patient ID: Fidela ONEIDA Antigua, female    DOB: [birth year], 83 y.o.   MRN: [id number]  Chief Complaint  Patient presents with   Hip Pain    Patient complains of right hip pain for 2 weeks.     Hip Pain     Discussed the use of AI scribe software for clinical note transcription with the patient, who gave verbal consent to proceed.  History of Present Illness  JALILAH WILTSIE is an 83 year old female with diabetes and neuropathy who presents with right hip pain radiating down the leg.  She experiences significant right hip pain radiating down the back of her leg, preventing prolonged sitting or standing. The pain was particularly severe yesterday, making walking and rising from a chair difficult. She was prescribed oxycodone  in May, which was ineffective after two doses. She currently takes gabapentin  three times daily for neuropathy but has not taken any medication today. Her blood sugar levels range from 180 to 220 mg/dL, and she struggles with diabetes management since discontinuing Trulicity . She checks her blood sugar daily and is concerned about her glucose control.  MR Lumbar spine 2018 noted the following:   IMPRESSION: 1. Diffuse lumbar spine spondylosis as described above. 2. No evidence of metastatic disease of the lumbar spine.     Health Maintenance Due  Topic Date Due   Mammogram  01/20/2024   Diabetic kidney evaluation - Urine ACR  08/17/2024    Past Medical History:  Diagnosis Date   Arthritis    Breast cancer (HCC) 2007   Right breast, s/p mastectomy   Chronic diastolic congestive heart failure (HCC)    per patient, dx by cardiology   Diabetes mellitus without complication (HCC)    GERD (gastroesophageal reflux disease)    Gout    Hyperlipidemia    Hypertension    Spinal stenosis     Past Surgical History:  Procedure Laterality Date   ABDOMINAL HYSTERECTOMY     ABDOMINAL SURGERY     BREAST BIOPSY Left    2016 benign    BREAST  BIOPSY Left 02/16/2023   MM LT BREAST BX W LOC DEV 1ST LESION IMAGE BX SPEC STEREO GUIDE 02/16/2023 [facility]   LEFT HEART CATH AND CORONARY ANGIOGRAPHY N/A 11/21/2021   Procedure: LEFT HEART CATH AND CORONARY ANGIOGRAPHY;  Surgeon: Darron Deatrice LABOR, MD;  Location: [facility];  Service: Cardiovascular;  Laterality: N/A;   MASTECTOMY     right side   REPLACEMENT TOTAL KNEE Bilateral    TUBAL LIGATION      Family History  Problem Relation Age of Onset   Diabetes Mother    Alzheimer's disease Mother    Hypertension Mother    Hypertension Father    Heart disease Father    Hyperlipidemia Father    Kidney disease Father    Diabetes Sister    Cancer Brother        lung   Alzheimer's disease Brother    Heart disease Brother    Hypertension Brother    Diabetes Brother    Hypertension Brother    Diabetes Brother    Hyperlipidemia Son    Hypertension Son    Diabetes Son    Hypertension Daughter    Hyperlipidemia Daughter     Social History   Socioeconomic History   Marital status: Married    Spouse name: Rutha   Number of children: 3   Years of education: 45  Highest education level: Not on file  Occupational History   Occupation: Retired  Tobacco Use   Smoking status: Former    Types: Cigarettes    Passive exposure: Never   Smokeless tobacco: Never  Vaping Use   Vaping status: Never Used  Substance and Sexual Activity   Alcohol  use: No   Drug use: No   Sexual activity: Not Currently  Other Topics Concern   Not on file  Social History Narrative   Marital Status:  Married Hydrologist)    Children:  Daughter(1) Son (1)    Pets:  None    Living Situation: Lives with husband and daughter.     Occupation:  Retired CONTRACTOR)    Education: 12th Grade    Tobacco Use/Exposure:  She used to smoke socially during the weekends but quit 40 years ago.    Alcohol  Use:  Occasional   Drug Use:  None   Diet:  Regular   Exercise:  None   Hobbies:  Reading and playing  volleyball             Social Drivers of Health   Financial Resource Strain: Low Risk  (04/11/2024)   Overall Financial Resource Strain (CARDIA)    Difficulty of Paying Living Expenses: Not hard at all  Food Insecurity: No Food Insecurity (06/29/2024)   Hunger Vital Sign    Worried About Running Out of Food in the Last Year: Never true    Ran Out of Food in the Last Year: Never true  Transportation Needs: No Transportation Needs (06/29/2024)   PRAPARE - Administrator, Civil Service (Medical): No    Lack of Transportation (Non-Medical): No  Physical Activity: Inactive (04/11/2024)   Exercise Vital Sign    Days of Exercise per Week: 0 days    Minutes of Exercise per Session: 0 min  Stress: No Stress Concern Present (04/11/2024)   [employer] of Occupational Health - Occupational Stress Questionnaire    Feeling of Stress : Not at all  Social Connections: Moderately Integrated (06/29/2024)   Social Connection and Isolation Panel    Frequency of Communication with Friends and Family: More than three times a week    Frequency of Social Gatherings with Friends and Family: More than three times a week    Attends Religious Services: More than 4 times per year    Active Member of [employer] or Organizations: No    Attends Banker Meetings: Never    Marital Status: Married  Catering Manager Violence: Not At Risk (06/29/2024)   Humiliation, Afraid, Rape, and Kick questionnaire    Fear of Current or Ex-Partner: No    Emotionally Abused: No    Physically Abused: No    Sexually Abused: No    Outpatient Medications Prior to Visit  Medication Sig Dispense Refill   acetaminophen  (TYLENOL ) 650 MG CR tablet Take 650 mg by mouth 2 (two) times daily.     albuterol  (VENTOLIN  HFA) 108 (90 Base) MCG/ACT inhaler INHALE 2 PUFFS BY MOUTH EVERY 6 HOURS AS NEEDED 8.5 each 2   Alcohol  Swabs (ALCOHOL  PREP PAD) 70 % PADS Use to check blood sugars 100 each 3   ascorbic acid (VITAMIN C)  1000 MG tablet Take 1,000 mg by mouth daily.     aspirin  EC 81 MG tablet Take 1 tablet (81 mg total) by mouth daily. Swallow whole. 90 tablet 3   Azelastine HCl 137 MCG/SPRAY SOLN SMARTSIG:1-2 Spray(s) Both Nares Twice Daily  Benzocaine-Benzethonium (LANACANE EX) Apply 1 application  topically daily as needed. Roll-on daily as needed for knee pain.     blood glucose meter kit and supplies KIT Dispense based on patient and insurance preference. Use up to four times daily as directed. (FOR ICD-9 250.00, 250.01). 1 each 11   brimonidine  (ALPHAGAN ) 0.2 % ophthalmic solution Place 1 drop into both eyes 3 (three) times daily.     bumetanide  (BUMEX ) 1 MG tablet TAKE 1 TABLET BY MOUTH TWICE A DAY 180 tablet 1   Cholecalciferol (VITAMIN D-3 PO) Take 2,000 Units by mouth daily.     Cyanocobalamin  (B-12 PO) Take 1 tablet by mouth daily.     dicyclomine  (BENTYL ) 10 MG capsule TAKE 1 TAB EVERY 6 HOURS AS NEEDED FOR ABDOMINAL CRAMPING. 360 capsule 1   empagliflozin  (JARDIANCE ) 10 MG TABS tablet Take 1 tablet (10 mg total) by mouth daily before breakfast. 30 tablet 5   ezetimibe  (ZETIA ) 10 MG tablet TAKE 1 TABLET BY MOUTH EVERY DAY 90 tablet 3   fluticasone  (FLONASE ) 50 MCG/ACT nasal spray SPRAY 2 SPRAYS INTO EACH NOSTRIL EVERY DAY 48 mL 1   gabapentin  (NEURONTIN ) 300 MG capsule Take 1 capsule (300 mg total) by mouth 3 (three) times daily. 270 capsule 1   glipiZIDE  (GLUCOTROL ) 10 MG tablet Take 2 tablets (20 mg total) by mouth daily before breakfast AND 1 tablet (10 mg total) daily before supper. 270 tablet 3   ketorolac  (ACULAR ) 0.5 % ophthalmic solution INSTILL 1 DROP INTO RIGHT EYE 4 TIMES A DAY 5 mL 6   levocetirizine (XYZAL ) 5 MG tablet TAKE 1 TABLET BY MOUTH EVERY DAY IN THE EVENING 90 tablet 1   losartan  (COZAAR ) 25 MG tablet Take 0.5 tablets (12.5 mg total) by mouth daily. 45 tablet 3   omeprazole  (PRILOSEC) 40 MG capsule Take 1 capsule (40 mg total) by mouth in the morning and at bedtime. 180 capsule  0   ondansetron  (ZOFRAN -ODT) 4 MG disintegrating tablet Take 1 tablet (4 mg total) by mouth every 8 (eight) hours as needed for nausea or vomiting. 20 tablet 0   OneTouch Delica Lancets 33G MISC Use as instructed to check blood sugar 2-3 times a day.  DX E11.9 100 each 3   ONETOUCH ULTRA TEST test strip CHECK BLOOD SUGAR 2 TO 3 TIMES DAILY AS DIRECTED 100 strip 5   oxyCODONE -acetaminophen  (PERCOCET) 5-325 MG tablet Take 1 tablet by mouth every 6 (six) hours as needed for severe pain (pain score 7-10). 20 tablet 0   polyethylene glycol (MIRALAX  / GLYCOLAX ) 17 g packet Take 17 g by mouth daily. 14 each 0   timolol  (TIMOPTIC ) 0.5 % ophthalmic solution Place 1 drop into both eyes 2 (two) times a day.     Vitamin A  2250 MCG (7500 UT) CAPS Take 1 capsule by mouth daily. You can find this over the counter. 30 capsule 11   vutrisiran  sodium (AMVUTTRA ) 25 MG/0.5ML syringe Inject 0.5 mLs (25 mg total) into the skin every 3 (three) months. 0.5 mL 3   VYZULTA 0.024 % SOLN Apply 1 drop to eye at bedtime.     No facility-administered medications prior to visit.    Allergies  Allergen Reactions   Penicillins Swelling   Sulfa Antibiotics Swelling   Amlodipine Swelling    LE Swelling   Lisinopril Cough   Trulicity  [Dulaglutide ] Other (See Comments)    Pancreatitis    ROS See HPI    Objective:    Physical Exam Constitutional:  General: She is not in acute distress.    Appearance: Normal appearance. She is well-developed.  HENT:     Head: Normocephalic and atraumatic.     Right Ear: External ear normal.     Left Ear: External ear normal.  Eyes:     General: No scleral icterus. Neck:     Thyroid : No thyromegaly.  Cardiovascular:     Rate and Rhythm: Normal rate and regular rhythm.     Heart sounds: Normal heart sounds. No murmur heard. Pulmonary:     Effort: Pulmonary effort is normal. No respiratory distress.     Breath sounds: Normal breath sounds. No wheezing.  Musculoskeletal:      Cervical back: Neck supple.  Skin:    General: Skin is warm and dry.  Neurological:     Mental Status: She is alert and oriented to person, place, and time.     Deep Tendon Reflexes:     Reflex Scores:      Patellar reflexes are 0 on the right side and 0 on the left side.    Comments: Bilateral LE strength is 4/5  Psychiatric:        Mood and Affect: Mood normal.        Behavior: Behavior normal.        Thought Content: Thought content normal.        Judgment: Judgment normal.      BP (!) 137/45 (BP Location: Right Arm, Patient Position: Sitting, Cuff Size: Large)   Pulse 76   Temp 97.7 F (36.5 C) (Oral)   Resp 16   Ht 5' 1 (1.549 m)   Wt 184 lb (83.5 kg)   SpO2 99%   BMI 34.77 kg/m  Wt Readings from Last 3 Encounters:  09/26/24 184 lb (83.5 kg)  09/20/24 183 lb (83 kg)  08/11/24 177 lb (80.3 kg)       Assessment & Plan:   Problem List Items Addressed This Visit       Unprioritized   Type 2 diabetes mellitus with stage 3b chronic kidney disease, without long-term current use of insulin  (HCC) - Primary   Relevant Medications   insulin  glargine (LANTUS ) 100 UNIT/ML Solostar Pen   Insulin  Pen Needle (PEN NEEDLES) 32G X 4 MM MISC   Other Visit Diagnoses       Sciatica associated with disorder of lumbar spine       Relevant Medications   methylPREDNISolone  (MEDROL  DOSEPAK) 4 MG TBPK tablet      Acute on Chronic right-sided sciatica with severe pain affecting mobility. Previous oxycodone  and gabapentin  ineffective. Old MRI showed lumbar spondylosis. She has CKD so will avoid NSAIDS. No significant improvement with oxycodone . Steroids considered for inflammation but may affect blood sugar  - Prescribe Medrol  Dosepak for inflammation. - Discuss prednisone side effects: increased blood sugar, weight gain. - Add Lantus  10 units SQ daily for blood sugar management during steroid treatment. When treatment is complete, may drop down to 5 units.   I am having Weda T.  Man start on insulin  glargine, Pen Needles, and methylPREDNISolone . I am also having her maintain her acetaminophen , Cholecalciferol (VITAMIN D-3 PO), blood glucose meter kit and supplies, brimonidine , timolol , ascorbic acid, OneTouch Delica Lancets 33G, ketorolac , albuterol , Cyanocobalamin  (B-12 PO), OneTouch Ultra Test, Alcohol  Prep Pad, aspirin  EC, Benzocaine-Benzethonium (LANACANE EX), Vitamin A , empagliflozin , Amvuttra , omeprazole , levocetirizine, fluticasone , Azelastine HCl, Vyzulta, losartan , ezetimibe , ondansetron , polyethylene glycol, oxyCODONE -acetaminophen , dicyclomine , bumetanide , glipiZIDE , and gabapentin .  Meds ordered this encounter  Medications  insulin  glargine (LANTUS ) 100 UNIT/ML Solostar Pen    Sig: Take 5-10 units once daily    Dispense:  3 mL    Refill:  2    Supervising Provider:   DOMENICA BLACKBIRD A [[id number]]   Insulin  Pen Needle (PEN NEEDLES) 32G X 4 MM MISC    Sig: Use daily as directed    Dispense:  100 each    Refill:  1    Supervising Provider:   DOMENICA BLACKBIRD A [[id number]]   methylPREDNISolone  (MEDROL  DOSEPAK) 4 MG TBPK tablet    Sig: Use per package instructions    Dispense:  21 tablet    Refill:  0    Supervising Provider:   DOMENICA BLACKBIRD A [[id number]]

## 2024-09-26 NOTE — Patient Instructions (Signed)
 VISIT SUMMARY:  During your visit, we discussed your right hip pain, chronic lower back pain, diabetes management, and year-round allergies. We reviewed your current medications and made adjustments to better manage your symptoms and improve your quality of life.  YOUR PLAN:  -RIGHT-SIDED SCIATICA: Sciatica is pain that radiates along the path of the sciatic nerve, which runs down each leg from the lower back. We will start you on a Medrol  Dosepak to reduce inflammation. Be aware that this medication can increase your blood sugar and cause weight gain. We will also consider using Lantus  insulin  to help manage your blood sugar during this treatment.  -CHRONIC LOWER BACK PAIN: Chronic lower back pain is persistent pain in the lower back area. We acknowledge the challenges in managing this pain due to your spinal issues and will continue to monitor and adjust your treatment as needed.  -TYPE 2 DIABETES MELLITUS: Type 2 diabetes is a condition that affects the way your body processes blood sugar. To help manage your blood sugar levels during your steroid treatment, we will prescribe a Lantus  insulin  pen. You should start with 10 units of Lantus  daily and reduce to 5 units after completing the steroid treatment. Please monitor your blood sugar regularly. If after you complete the steroid your sugars continue to run >200, please increase lantus  back up to 10 units/day  -ALLERGIC RHINITIS: Allergic rhinitis is an allergic reaction that causes sneezing, congestion, and a runny nose. We will continue to explore options to better manage your year-round allergies.  INSTRUCTIONS:  Please follow up with Dr. Sam in November. We will send a note to Dr. Rosaria regarding your treatment plan. Additionally, we will check your insurance for medication coverage and send your prescriptions to [facility] in [place].

## 2024-10-16 ENCOUNTER — Ambulatory Visit (INDEPENDENT_AMBULATORY_CARE_PROVIDER_SITE_OTHER): Admitting: Internal Medicine

## 2024-10-16 ENCOUNTER — Encounter: Payer: Self-pay | Admitting: Internal Medicine

## 2024-10-16 VITALS — BP 112/60 | Ht 61.0 in | Wt 179.0 lb

## 2024-10-16 DIAGNOSIS — N1832 Chronic kidney disease, stage 3b: Secondary | ICD-10-CM | POA: Diagnosis not present

## 2024-10-16 DIAGNOSIS — E1122 Type 2 diabetes mellitus with diabetic chronic kidney disease: Secondary | ICD-10-CM

## 2024-10-16 DIAGNOSIS — E1142 Type 2 diabetes mellitus with diabetic polyneuropathy: Secondary | ICD-10-CM | POA: Diagnosis not present

## 2024-10-16 DIAGNOSIS — Z794 Long term (current) use of insulin: Secondary | ICD-10-CM | POA: Diagnosis not present

## 2024-10-16 DIAGNOSIS — Z7984 Long term (current) use of oral hypoglycemic drugs: Secondary | ICD-10-CM

## 2024-10-16 LAB — POCT GLYCOSYLATED HEMOGLOBIN (HGB A1C): Hemoglobin A1C: 7.7 % — AB (ref 4.0–5.6)

## 2024-10-16 MED ORDER — FREESTYLE LIBRE 3 PLUS SENSOR MISC: 1.0000 | 3 refills | Status: AC

## 2024-10-16 NOTE — Patient Instructions (Addendum)
 Take glipizide  10 mg, TWO tablets before breakfast and TWO tablet before supper Take Lantus  10 units once daily     HOW TO TREAT LOW BLOOD SUGARS (Blood sugar LESS THAN 70 MG/DL) Please follow the RULE OF 15 for the treatment of hypoglycemia treatment (when your (blood sugars are less than 70 mg/dL)   STEP 1: Take 15 grams of carbohydrates when your blood sugar is low, which includes:  3-4 GLUCOSE TABS  OR 3-4 OZ OF JUICE OR REGULAR SODA OR ONE TUBE OF GLUCOSE GEL    STEP 2: RECHECK blood sugar in 15 MINUTES STEP 3: If your blood sugar is still low at the 15 minute recheck --> then, go back to STEP 1 and treat AGAIN with another 15 grams of carbohydrates.

## 2024-10-16 NOTE — Progress Notes (Signed)
 Name: Donna Fuller  Age/ Sex: 83 y.o., female   MRN/ DOB: [id number], 03/03/1941     PCP: Dorina Dallas RIGGERS   Reason for Endocrinology Evaluation: Type 2 Diabetes Mellitus  Initial Endocrine Consultative Visit: 12/14/2019    PATIENT IDENTIFIER: [name] is a 83 y.o. female with a past medical history of T2DM, HTN, RA and Dyslipidemia. The patient has followed with Endocrinology clinic since 12/14/2019 for consultative assistance with management of her diabetes.  DIABETIC HISTORY:  [name] was diagnosed with T2DM many years ago. She was on metformin  due to low renal function, has been on Januvia , Farxia and insulin  as well. Her hemoglobin A1c has ranged from 6.0 %in 2015, peaking at 8.8% in 2020.  On her initial visit to our clinic her A1c was 8.8%   , she was on Trulicity  and pioglitazone , we added glipizide    Stopped Pioglitazone  03/2022 due to A1c 5.7 %    Trulicity  discontinued July, 2025 due to pancreatitis  SUBJECTIVE:   During the last visit (08/11/2024): A1c 7.7%    Today (10/16/2024): Ms. Risse is here for follow-up on diabetes management. She checks her blood sugars 2-3 times daily.  The patient has not had hypoglycemic episodes since the last clinic visit.   She follows with Dr. Monna for for seropositive rheumatoid arthritis  She also follows with nephrology for CKD with proteinuria  She also follows with cardiology for CHF, cardiac amyloidosis   She is c/o right hip pain which has caused irritability, she did received 2 courses of prednisone    She was prescribed Lantus  through PCP 's  She is having abdominal pain She did have dizziness and a fall at the house  Has constipation    HOME DIABETES REGIMEN:  Glipizide  10 mg ,2 tablets before breakfast and 1 tab before supper- has been taking 3 tabs QAM and 2 tabs QAM  Jardiance  10 mg, 1 tablet daily through cardiology Lantus  10 units daily   METER DOWNLOAD SUMMARY:  116 - 318  mg/dL     DIABETIC COMPLICATIONS: Microvascular complications:  Neuropathy  Denies: retinopathy , CKD Last eye exam: Completed 02/15/2024  Macrovascular complications:    Denies: CAD, PVD, CVA     HISTORY:  Past Medical History:  Past Medical History:  Diagnosis Date   Arthritis    Breast cancer (HCC) 2007   Right breast, s/p mastectomy   Chronic diastolic congestive heart failure (HCC)    per patient, dx by cardiology   Diabetes mellitus without complication (HCC)    GERD (gastroesophageal reflux disease)    Gout    Hyperlipidemia    Hypertension    Spinal stenosis    Past Surgical History:  Past Surgical History:  Procedure Laterality Date   ABDOMINAL HYSTERECTOMY     ABDOMINAL SURGERY     BREAST BIOPSY Left    2016 benign    BREAST BIOPSY Left 02/16/2023   MM LT BREAST BX W LOC DEV 1ST LESION IMAGE BX SPEC STEREO GUIDE 02/16/2023 [facility]   LEFT HEART CATH AND CORONARY ANGIOGRAPHY N/A 11/21/2021   Procedure: LEFT HEART CATH AND CORONARY ANGIOGRAPHY;  Surgeon: Darron Deatrice LABOR, MD;  Location: [facility];  Service: Cardiovascular;  Laterality: N/A;   MASTECTOMY     right side   REPLACEMENT TOTAL KNEE Bilateral    TUBAL LIGATION     Social History:  reports that she has quit smoking. Her smoking use included cigarettes. She has never  been exposed to tobacco smoke. She has never used smokeless tobacco. She reports that she does not drink alcohol  and does not use drugs. Family History:  Family History  Problem Relation Age of Onset   Diabetes Mother    Alzheimer's disease Mother    Hypertension Mother    Hypertension Father    Heart disease Father    Hyperlipidemia Father    Kidney disease Father    Diabetes Sister    Cancer Brother        lung   Alzheimer's disease Brother    Heart disease Brother    Hypertension Brother    Diabetes Brother    Hypertension Brother    Diabetes Brother    Hyperlipidemia Son    Hypertension Son     Diabetes Son    Hypertension Daughter    Hyperlipidemia Daughter      HOME MEDICATIONS: Allergies as of 10/16/2024       Reactions   Penicillins Swelling   Sulfa Antibiotics Swelling   Amlodipine Swelling   LE Swelling   Lisinopril Cough   Trulicity  [dulaglutide ] Other (See Comments)   Pancreatitis        Medication List        Accurate as of October 16, 2024  2:14 PM. If you have any questions, ask your nurse or doctor.          acetaminophen  650 MG CR tablet Commonly known as: TYLENOL  Take 650 mg by mouth 2 (two) times daily.   albuterol  108 (90 Base) MCG/ACT inhaler Commonly known as: VENTOLIN  HFA INHALE 2 PUFFS BY MOUTH EVERY 6 HOURS AS NEEDED   Alcohol  Prep Pad 70 % Pads Use to check blood sugars   Amvuttra  25 MG/0.5ML syringe Generic drug: vutrisiran  sodium Inject 0.5 mLs (25 mg total) into the skin every 3 (three) months.   ascorbic acid 1000 MG tablet Commonly known as: VITAMIN C Take 1,000 mg by mouth daily.   aspirin  EC 81 MG tablet Take 1 tablet (81 mg total) by mouth daily. Swallow whole.   Azelastine HCl 137 MCG/SPRAY Soln SMARTSIG:1-2 Spray(s) Both Nares Twice Daily   B-12 PO Take 1 tablet by mouth daily.   blood glucose meter kit and supplies Kit Dispense based on patient and insurance preference. Use up to four times daily as directed. (FOR ICD-9 250.00, 250.01).   brimonidine  0.2 % ophthalmic solution Commonly known as: ALPHAGAN  Place 1 drop into both eyes 3 (three) times daily.   bumetanide  1 MG tablet Commonly known as: BUMEX  TAKE 1 TABLET BY MOUTH TWICE A DAY   dicyclomine  10 MG capsule Commonly known as: BENTYL  TAKE 1 TAB EVERY 6 HOURS AS NEEDED FOR ABDOMINAL CRAMPING.   empagliflozin  10 MG Tabs tablet Commonly known as: Jardiance  Take 1 tablet (10 mg total) by mouth daily before breakfast.   ezetimibe  10 MG tablet Commonly known as: ZETIA  TAKE 1 TABLET BY MOUTH EVERY DAY   fluticasone  50 MCG/ACT nasal  spray Commonly known as: FLONASE  SPRAY 2 SPRAYS INTO EACH NOSTRIL EVERY DAY   gabapentin  300 MG capsule Commonly known as: NEURONTIN  Take 1 capsule (300 mg total) by mouth 3 (three) times daily.   glipiZIDE  10 MG tablet Commonly known as: GLUCOTROL  Take 2 tablets (20 mg total) by mouth daily before breakfast AND 1 tablet (10 mg total) daily before supper.   insulin  glargine 100 UNIT/ML Solostar Pen Commonly known as: LANTUS  Take 5-10 units once daily   ketorolac  0.5 % ophthalmic solution Commonly  known as: ACULAR  INSTILL 1 DROP INTO RIGHT EYE 4 TIMES A DAY   LANACANE EX Apply 1 application  topically daily as needed. Roll-on daily as needed for knee pain.   levocetirizine 5 MG tablet Commonly known as: XYZAL  TAKE 1 TABLET BY MOUTH EVERY DAY IN THE EVENING   losartan  25 MG tablet Commonly known as: COZAAR  Take 0.5 tablets (12.5 mg total) by mouth daily.   methylPREDNISolone  4 MG Tbpk tablet Commonly known as: MEDROL  DOSEPAK Use per package instructions   omeprazole  40 MG capsule Commonly known as: PRILOSEC Take 1 capsule (40 mg total) by mouth in the morning and at bedtime.   ondansetron  4 MG disintegrating tablet Commonly known as: ZOFRAN -ODT Take 1 tablet (4 mg total) by mouth every 8 (eight) hours as needed for nausea or vomiting.   OneTouch Delica Lancets 33G Misc Use as instructed to check blood sugar 2-3 times a day.  DX E11.9   OneTouch Ultra Test test strip Generic drug: glucose blood CHECK BLOOD SUGAR 2 TO 3 TIMES DAILY AS DIRECTED   oxyCODONE -acetaminophen  5-325 MG tablet Commonly known as: Percocet Take 1 tablet by mouth every 6 (six) hours as needed for severe pain (pain score 7-10).   Pen Needles 32G X 4 MM Misc Use daily as directed   polyethylene glycol 17 g packet Commonly known as: MIRALAX  / GLYCOLAX  Take 17 g by mouth daily.   timolol  0.5 % ophthalmic solution Commonly known as: TIMOPTIC  Place 1 drop into both eyes 2 (two) times a day.    Vitamin A  2250 MCG (7500 UT) Caps Take 1 capsule by mouth daily. You can find this over the counter.   VITAMIN D-3 PO Take 2,000 Units by mouth daily.   Vyzulta 0.024 % Soln Generic drug: Latanoprostene Bunod Apply 1 drop to eye at bedtime.         OBJECTIVE:   Vital Signs: BP 112/60   Ht 5' 1 (1.549 m)   Wt 179 lb (81.2 kg)   BMI 33.82 kg/m   Wt Readings from Last 3 Encounters:  10/16/24 179 lb (81.2 kg)  09/26/24 184 lb (83.5 kg)  09/20/24 183 lb (83 kg)     Exam: General: Pt appears well and is in NAD  Lungs: Clear with good BS bilat   Heart: RRR   Neuro: MS is good with appropriate affect, pt is alert and Ox3    DM foot exam:   04/20/2024  The skin of the feet is intact without sores or ulcerations. The pedal pulses are 1+ on right and 1+ on left. The sensation is intact to a screening 5.07, 10 gram monofilament bilaterally    DATA REVIEWED:  Lab Results  Component Value Date   HGBA1C 7.7 (A) 08/11/2024   HGBA1C 6.8 (A) 04/20/2024   HGBA1C 6.8 (A) 10/21/2023   Lab Results  Component Value Date   MICROALBUR 0.4 04/21/2017   LDLCALC 55 08/06/2023   CREATININE 1.65 (H) 07/05/2024     Latest Reference Range & Units 07/05/24 14:10  Comprehensive metabolic panel with GFR  Rpt !  Sodium 135 - 145 mEq/L 141  Potassium 3.5 - 5.1 mEq/L 4.2  Chloride 96 - 112 mEq/L 97  CO2 19 - 32 mEq/L 26  Glucose 70 - 99 mg/dL 833 (H)  BUN 6 - 23 mg/dL 31 (H)  Creatinine 9.59 - 1.20 mg/dL 8.34 (H)  Calcium 8.4 - 10.5 mg/dL 9.7  Alkaline Phosphatase 39 - 117 U/L 168 (H)  Albumin  3.5 - 5.2 g/dL 4.1  AST 0 - 37 U/L 18  ALT 0 - 35 U/L 14  Total Protein 6.0 - 8.3 g/dL 7.2  Total Bilirubin 0.2 - 1.2 mg/dL 0.4  GFR >39.99 mL/min 28.76 (L)     ASSESSMENT / PLAN / RECOMMENDATIONS:   1) Type 2 Diabetes Mellitus, Sub-optimally controlled, With neuropathic and CKD III complications - Most recent A1c of 7.7%. Goal A1c <7.5%.   -A1c remains stable, but she is very  frustrated with variable complaints, one of them is hyperglycemia, but she has been on 2 courses of prednisone for right hip pain, continues with hip pain -Her PCP started on basal insulin , she used to be on insulin  in the past - Stopped  Pioglitazone  due to LE edema/CHF  - Intolerant to Farxiga  - rash  - Developed pancreatitis while on Trulicity  - Limited glycemic agents due to CKD - She is on Jardiance  through cardiology - I have encouraged patient to continue taking insulin - -unfortunately she has been taking up to 5 tablets of glipizide  a day???,  I did advise the patient against self adjusting medication and to prevent overdose on medications - She was prescribed freestyle libre 3, she was trained by my assistant on [employer] use  MEDICATIONS:  - Take glipizide  10 mg, 2 tabs before breakfast and 2 tab before supper -Continue Lantus  10 units daily     EDUCATION / INSTRUCTIONS: BG monitoring instructions: Patient is instructed to check her blood sugars 2 times a day, fasting and suppertime. Call [facility] if: BG persistently < 70  I reviewed the Rule of 15 for the treatment of hypoglycemia in detail with the patient. Literature supplied.    2) Diabetic complications:  Eye: Does not have known diabetic retinopathy.  Neuro/ Feet: Does  have known diabetic peripheral neuropathy. Renal: Patient does not have known baseline CKD. She is  on an ACEI/ARB at present.    F/U in 6 weeks  I spent 25 minutes preparing to see the patient by review of recent labs, imaging and procedures, obtaining and reviewing separately obtained history, communicating with the patient/family or caregiver, ordering medications, tests or procedures, and documenting clinical information in the EHR including the differential Dx, treatment, and any further evaluation and other management    Signed electronically by: Stefano Redgie Butts, MD  [facility] Endocrinology  [facility] [address] [place] [place], [place] [address] Phone: [phone] FAX: [phone]   CC: Dorina Dallas RIGGERS [address] [place] [address] Phone: [phone]  Fax: [phone]  Return to Endocrinology clinic as below: Future Appointments  Date Time Provider Department Center  10/20/2024 10:40 AM Craig Alan JONELLE RIGGERS [facility]  11/27/2024 11:50 AM Aryn Kops, Donell Redgie, MD [facility] None  12/20/2024 11:30 AM [facility] None  02/07/2025 11:00 AM Dolphus Reiter, MD [facility] None  04/17/2025  1:10 PM [facility] [place] [facility] VISIT 2 [facility] [address]

## 2024-10-19 NOTE — Progress Notes (Signed)
 10/20/2024 Donna Fuller [id number] 17-Apr-1941  Referring provider: Dorina Loving, PA-C Primary GI doctor: Dr. Charlanne  ASSESSMENT AND PLAN:  Acute pancreatitis August 2025 likely secondary to GLP 1 ( Trulicity ) 07/05/2024 LIPASE 131.0 (87) (129) (155) (196) 07/05/2024 WBC 6.5 HGB 13.0 BUN 31 Cr 1.65  07/05/2024 AST 18 ALT 14 Alkphos 168 TBili 0.4 06/29/2024  Calcium 9.7  Potassium 4.2  Magnesium  2.3 Triglycerides 133  06/29/2024 CTAP W periumbilical abdominal pain showed inflammatory changes involving pancreatic head suggesting acute pancreatitis no pseudocyst or ductal dilation normal liver unremarkable gallbladder 06/29/2024 RUQ US  no cholelithiasis or acute cholecystitis Denies nausea and vomiting but still has some AB pain -Low likely hood pancreatitis, will recheck amylage and lipase -Consider CT or MRCP if elevated or abnormal  Constipation 07/05/2024 KUB moderate fecal retention compatible with history of constipation Likely worsened with Jardiance  ,bumex ,  BM 3 x a week, on miralax  as needed Has hard stools, denies hematochezia, likely contributing to AB pain and upper GI symptoms  Recommended daily Miralax , starting with half a capful to a capful daily, adjusting every 3-4 days based on bowel movements. - Provided information on managing constipation  Dysphagia, intermittent, worse since being off PPI 06/15/2022 barium swallow mild esophageal dysmotility otherwise unremarkable Has ran out of omeprazole  since July, has had more GERD since being off medication, also had steroids x 2 in Oct Denies melena, mild weight loss -Refill omeprazole  - given gastroparesis diet  [facility] 11/20/2014 colonoscopy with moderate diverticulosis in the left colon and small internal hemorrhoids.   CAD  CKD stage IIIb with type 2 diabetes Increase in sugars with recent oral steroid use  Patient Care Team: Saguier, Edward, PA-C as PCP - General (Internal Medicine) Perla Evalene PARAS, MD as PCP  - Cardiology (Cardiology) Dolphus Reiter, MD as Consulting Physician (Rheumatology) Chick Venetia BRAVO, MD as Consulting Physician (Family Medicine) Agrawal, Kavita, MD as Consulting Physician (Oncology) Marcey Elspeth PARAS, MD as Consulting Physician (Ophthalmology)  HISTORY OF PRESENT ILLNESS: 83 y.o. female with a past medical history listed below presents for evaluation of constipation.   Last seen by Delon Failing, PA on 05/29/2022 for dysphagia.  Discussed the use of AI scribe software for clinical note transcription with the patient, who gave verbal consent to proceed.  History of Present Illness   Donna Fuller is an 83 year old female with a history of pancreatitis who presents with abdominal discomfort and gastrointestinal symptoms.  She has a history of pancreatitis from late July to August, and she recalls that it may have been related to her use of Trulicity , a GLP-1 receptor agonist. She is no longer on Trulicity  and is currently managing her diabetes with insulin , Lantus , and glipizide . She experiences intermittent abdominal pain, described as a 'sore spot' across her abdomen, with occasional radiation to her lower back. No nausea or vomiting is present.  Since discontinuing omeprazole  in July, she has experienced heartburn and reflux. She feels 'choked' when eating certain dense foods, such as barbecue sandwiches. No dark black stools, use of NSAIDs, or significant weight loss, although there is a slight weight loss of a few pounds.  Her bowel movements occur approximately three times a week, which she considers normal. She uses Miralax  as needed to aid with bowel movements but not daily. No diarrhea or blood in her stool.  Her blood sugar levels were previously uncontrolled due to oral steroid use for a side nerve issue but have since stabilized. She has a history  of spinal stenosis and scoliosis, contributing to her lower back pain, which she manages primarily with Tylenol ,  as she cannot take NSAIDs.  She reports a family history of pancreatitis, noting that her granddaughter has experienced multiple episodes. She uses a continuous glucose monitor to track her blood sugar levels.        She  reports that she has quit smoking. Her smoking use included cigarettes. She has never been exposed to tobacco smoke. She has never used smokeless tobacco. She reports that she does not drink alcohol  and does not use drugs.  RELEVANT GI HISTORY, IMAGING AND LABS: Results   RADIOLOGY Abdominal X-ray: Constipation with significant fecal retention (06/2022)      CBC    Component Value Date/Time   WBC 6.5 07/05/2024 1410   RBC 4.27 07/05/2024 1410   HGB 13.0 07/05/2024 1410   HCT 39.9 07/05/2024 1410   PLT 206.0 07/05/2024 1410   MCV 93.5 07/05/2024 1410   MCH 30.1 06/30/2024 0432   MCHC 32.6 07/05/2024 1410   RDW 15.2 07/05/2024 1410   LYMPHSABS 1.5 07/05/2024 1410   MONOABS 0.5 07/05/2024 1410   EOSABS 0.1 07/05/2024 1410   BASOSABS 0.0 07/05/2024 1410   Recent Labs    11/05/23 1802 11/07/23 0607 11/08/23 0545 12/09/23 1436 06/06/24 1112 06/27/24 1440 06/29/24 1442 06/30/24 0432 07/05/24 1410  HGB 12.2 10.6* 10.7* 13.1 13.2 14.4 14.1 13.4 13.0    CMP     Component Value Date/Time   NA 141 07/05/2024 1410   NA 140 02/21/2024 1450   K 4.2 07/05/2024 1410   CL 97 07/05/2024 1410   CO2 26 07/05/2024 1410   GLUCOSE 166 (H) 07/05/2024 1410   BUN 31 (H) 07/05/2024 1410   BUN 36 (H) 02/21/2024 1450   CREATININE 1.65 (H) 07/05/2024 1410   CREATININE 1.90 (H) 06/18/2023 1448   CALCIUM 9.7 07/05/2024 1410   PROT 7.2 07/05/2024 1410   PROT 7.1 12/12/2014 1022   ALBUMIN 4.1 07/05/2024 1410   AST 18 07/05/2024 1410   ALT 14 07/05/2024 1410   ALKPHOS 168 (H) 07/05/2024 1410   BILITOT 0.4 07/05/2024 1410   GFRNONAA 47 (L) 06/30/2024 0432   GFRAA 51 (L) 06/03/2019 0549      Latest Ref Rng & Units 07/05/2024    2:10 PM 06/30/2024    4:32 AM 06/29/2024     2:42 PM  Hepatic Function  Total Protein 6.0 - 8.3 g/dL 7.2  6.9  7.2   Albumin 3.5 - 5.2 g/dL 4.1  3.3  3.6   AST 0 - 37 U/L 18  19  22    ALT 0 - 35 U/L 14  9  11    Alk Phosphatase 39 - 117 U/L 168  122  123   Total Bilirubin 0.2 - 1.2 mg/dL 0.4  0.7  1.5       Current Medications:   Current Outpatient Medications (Endocrine & Metabolic):    empagliflozin  (JARDIANCE ) 10 MG TABS tablet, Take 1 tablet (10 mg total) by mouth daily before breakfast.   glipiZIDE  (GLUCOTROL ) 10 MG tablet, Take 2 tablets (20 mg total) by mouth daily before breakfast AND 1 tablet (10 mg total) daily before supper.   insulin  glargine (LANTUS ) 100 UNIT/ML Solostar Pen, Take 5-10 units once daily   methylPREDNISolone  (MEDROL  DOSEPAK) 4 MG TBPK tablet, Use per package instructions  Current Outpatient Medications (Cardiovascular):    bumetanide  (BUMEX ) 1 MG tablet, TAKE 1 TABLET BY MOUTH TWICE A DAY  ezetimibe  (ZETIA ) 10 MG tablet, TAKE 1 TABLET BY MOUTH EVERY DAY   losartan  (COZAAR ) 25 MG tablet, Take 0.5 tablets (12.5 mg total) by mouth daily.  Current Outpatient Medications (Respiratory):    albuterol  (VENTOLIN  HFA) 108 (90 Base) MCG/ACT inhaler, INHALE 2 PUFFS BY MOUTH EVERY 6 HOURS AS NEEDED   Azelastine HCl 137 MCG/SPRAY SOLN, SMARTSIG:1-2 Spray(s) Both Nares Twice Daily   fluticasone  (FLONASE ) 50 MCG/ACT nasal spray, SPRAY 2 SPRAYS INTO EACH NOSTRIL EVERY DAY   levocetirizine (XYZAL ) 5 MG tablet, TAKE 1 TABLET BY MOUTH EVERY DAY IN THE EVENING  Current Outpatient Medications (Analgesics):    acetaminophen  (TYLENOL ) 650 MG CR tablet, Take 650 mg by mouth 2 (two) times daily.   aspirin  EC 81 MG tablet, Take 1 tablet (81 mg total) by mouth daily. Swallow whole.   oxyCODONE -acetaminophen  (PERCOCET) 5-325 MG tablet, Take 1 tablet by mouth every 6 (six) hours as needed for severe pain (pain score 7-10).  Current Outpatient Medications (Hematological):    Cyanocobalamin  (B-12 PO), Take 1 tablet by mouth  daily.  Current Outpatient Medications (Other):    Alcohol  Swabs (ALCOHOL  PREP PAD) 70 % PADS, Use to check blood sugars   ascorbic acid (VITAMIN C) 1000 MG tablet, Take 1,000 mg by mouth daily.   Benzocaine-Benzethonium (LANACANE EX), Apply 1 application  topically daily as needed. Roll-on daily as needed for knee pain.   blood glucose meter kit and supplies KIT, Dispense based on patient and insurance preference. Use up to four times daily as directed. (FOR ICD-9 250.00, 250.01).   brimonidine  (ALPHAGAN ) 0.2 % ophthalmic solution, Place 1 drop into both eyes 3 (three) times daily.   Cholecalciferol (VITAMIN D-3 PO), Take 2,000 Units by mouth daily.   Continuous Glucose Sensor (FREESTYLE LIBRE 3 PLUS SENSOR) MISC, 1 Device by Other route every 14 (fourteen) days. Change sensor every 15 days.   dicyclomine  (BENTYL ) 10 MG capsule, TAKE 1 TAB EVERY 6 HOURS AS NEEDED FOR ABDOMINAL CRAMPING.   gabapentin  (NEURONTIN ) 300 MG capsule, Take 1 capsule (300 mg total) by mouth 3 (three) times daily.   Insulin  Pen Needle (PEN NEEDLES) 32G X 4 MM MISC, Use daily as directed   ketorolac  (ACULAR ) 0.5 % ophthalmic solution, INSTILL 1 DROP INTO RIGHT EYE 4 TIMES A DAY   omeprazole  (PRILOSEC) 40 MG capsule, Take 1 capsule (40 mg total) by mouth 2 (two) times daily before a meal.   ondansetron  (ZOFRAN -ODT) 4 MG disintegrating tablet, Take 1 tablet (4 mg total) by mouth every 8 (eight) hours as needed for nausea or vomiting.   OneTouch Delica Lancets 33G MISC, Use as instructed to check blood sugar 2-3 times a day.  DX E11.9   ONETOUCH ULTRA TEST test strip, CHECK BLOOD SUGAR 2 TO 3 TIMES DAILY AS DIRECTED   polyethylene glycol (MIRALAX  / GLYCOLAX ) 17 g packet, Take 17 g by mouth daily.   timolol  (TIMOPTIC ) 0.5 % ophthalmic solution, Place 1 drop into both eyes 2 (two) times a day.   Vitamin A  2250 MCG (7500 UT) CAPS, Take 1 capsule by mouth daily. You can find this over the counter.   vutrisiran  sodium (AMVUTTRA )  25 MG/0.5ML syringe, Inject 0.5 mLs (25 mg total) into the skin every 3 (three) months.   VYZULTA 0.024 % SOLN, Apply 1 drop to eye at bedtime.  Medical History:  Past Medical History:  Diagnosis Date   Arthritis    Breast cancer [facility]) 2007   Right breast, s/p mastectomy   Chronic  diastolic congestive heart failure (HCC)    per patient, dx by cardiology   Diabetes mellitus without complication (HCC)    GERD (gastroesophageal reflux disease)    Gout    Hyperlipidemia    Hypertension    Spinal stenosis    Allergies:  Allergies  Allergen Reactions   Penicillins Swelling   Sulfa Antibiotics Swelling   Amlodipine Swelling    LE Swelling   Lisinopril Cough   Trulicity  [Dulaglutide ] Other (See Comments)    Pancreatitis     Surgical History:  She  has a past surgical history that includes Abdominal hysterectomy; Mastectomy; Tubal ligation; Breast biopsy (Left); Replacement total knee (Bilateral); LEFT HEART CATH AND CORONARY ANGIOGRAPHY (N/A, 11/21/2021); Abdominal surgery; and Breast biopsy (Left, 02/16/2023). Family History:  Her family history includes Alzheimer's disease in her brother and mother; Cancer in her brother; Diabetes in her brother, brother, mother, sister, and son; Heart disease in her brother and father; Hyperlipidemia in her daughter, father, and son; Hypertension in her brother, brother, daughter, father, mother, and son; Kidney disease in her father.  REVIEW OF SYSTEMS  : All other systems reviewed and negative except where noted in the History of Present Illness.  PHYSICAL EXAM: BP 102/62 (BP Location: Right Arm, Patient Position: Sitting, Cuff Size: Normal)   Pulse 74   Ht 5' 1 (1.549 m)   Wt 183 lb (83 kg)   BMI 34.58 kg/m  Physical Exam   GENERAL APPEARANCE: Well nourished, in no apparent distress. HEENT: No cervical lymphadenopathy, unremarkable thyroid , sclerae anicteric, conjunctiva pink. RESPIRATORY: Respiratory effort normal, breath sounds equal  bilaterally without rales, rhonchi, or wheezing. CARDIO: Regular rate and rhythm with no murmurs, rubs, or gallops, peripheral pulses intact. ABDOMEN: Soft, non-distended, active bowel sounds in all four quadrants, tender to palpation, normal to auscultation, no rebound, no mass appreciated. RECTAL: Declines. MUSCULOSKELETAL: Full range of motion, normal gait, without edema. SKIN: Dry, intact without rashes or lesions. No jaundice. NEURO: Alert, oriented, no focal deficits. PSYCH: Cooperative, normal mood and affect.      Alan JONELLE Coombs, PA-C 11:51 AM

## 2024-10-20 ENCOUNTER — Other Ambulatory Visit: Payer: Self-pay

## 2024-10-20 ENCOUNTER — Ambulatory Visit: Admitting: Physician Assistant

## 2024-10-20 ENCOUNTER — Encounter: Payer: Self-pay | Admitting: Physician Assistant

## 2024-10-20 ENCOUNTER — Other Ambulatory Visit

## 2024-10-20 ENCOUNTER — Ambulatory Visit: Payer: Self-pay | Admitting: Physician Assistant

## 2024-10-20 VITALS — BP 102/62 | HR 74 | Ht 61.0 in | Wt 183.0 lb

## 2024-10-20 DIAGNOSIS — K5904 Chronic idiopathic constipation: Secondary | ICD-10-CM | POA: Diagnosis not present

## 2024-10-20 DIAGNOSIS — K219 Gastro-esophageal reflux disease without esophagitis: Secondary | ICD-10-CM

## 2024-10-20 DIAGNOSIS — I5033 Acute on chronic diastolic (congestive) heart failure: Secondary | ICD-10-CM | POA: Diagnosis not present

## 2024-10-20 DIAGNOSIS — K859 Acute pancreatitis without necrosis or infection, unspecified: Secondary | ICD-10-CM

## 2024-10-20 DIAGNOSIS — I2510 Atherosclerotic heart disease of native coronary artery without angina pectoris: Secondary | ICD-10-CM | POA: Diagnosis not present

## 2024-10-20 DIAGNOSIS — R1319 Other dysphagia: Secondary | ICD-10-CM

## 2024-10-20 DIAGNOSIS — R748 Abnormal levels of other serum enzymes: Secondary | ICD-10-CM

## 2024-10-20 LAB — HEPATIC FUNCTION PANEL
ALT: 13 U/L (ref 0–35)
AST: 20 U/L (ref 0–37)
Albumin: 3.9 g/dL (ref 3.5–5.2)
Alkaline Phosphatase: 190 U/L — ABNORMAL HIGH (ref 39–117)
Bilirubin, Direct: 0.1 mg/dL (ref 0.0–0.3)
Total Bilirubin: 0.7 mg/dL (ref 0.2–1.2)
Total Protein: 7 g/dL (ref 6.0–8.3)

## 2024-10-20 LAB — CBC WITH DIFFERENTIAL/PLATELET
Basophils Absolute: 0 K/uL (ref 0.0–0.1)
Basophils Relative: 0.5 % (ref 0.0–3.0)
Eosinophils Absolute: 0.1 K/uL (ref 0.0–0.7)
Eosinophils Relative: 2.3 % (ref 0.0–5.0)
HCT: 40 % (ref 36.0–46.0)
Hemoglobin: 13.1 g/dL (ref 12.0–15.0)
Lymphocytes Relative: 33.1 % (ref 12.0–46.0)
Lymphs Abs: 1.6 K/uL (ref 0.7–4.0)
MCHC: 32.8 g/dL (ref 30.0–36.0)
MCV: 93.1 fl (ref 78.0–100.0)
Monocytes Absolute: 0.5 K/uL (ref 0.1–1.0)
Monocytes Relative: 9.6 % (ref 3.0–12.0)
Neutro Abs: 2.6 K/uL (ref 1.4–7.7)
Neutrophils Relative %: 54.5 % (ref 43.0–77.0)
Platelets: 134 K/uL — ABNORMAL LOW (ref 150.0–400.0)
RBC: 4.3 Mil/uL (ref 3.87–5.11)
RDW: 15.8 % — ABNORMAL HIGH (ref 11.5–15.5)
WBC: 4.8 K/uL (ref 4.0–10.5)

## 2024-10-20 LAB — BASIC METABOLIC PANEL WITH GFR
BUN: 29 mg/dL — ABNORMAL HIGH (ref 6–23)
CO2: 31 meq/L (ref 19–32)
Calcium: 9 mg/dL (ref 8.4–10.5)
Chloride: 100 meq/L (ref 96–112)
Creatinine, Ser: 1.71 mg/dL — ABNORMAL HIGH (ref 0.40–1.20)
GFR: 27.5 mL/min — ABNORMAL LOW (ref >60.00)
Glucose, Bld: 260 mg/dL — ABNORMAL HIGH (ref 70–99)
Potassium: 4.2 meq/L (ref 3.5–5.1)
Sodium: 140 meq/L (ref 135–145)

## 2024-10-20 LAB — LIPASE: Lipase: 34 U/L (ref 11.0–59.0)

## 2024-10-20 LAB — AMYLASE: Amylase: 45 U/L (ref 27–131)

## 2024-10-20 MED ORDER — OMEPRAZOLE 40 MG PO CPDR: 40.0000 mg | DELAYED_RELEASE_CAPSULE | Freq: Two times a day (BID) | ORAL | 3 refills | Status: AC

## 2024-10-20 NOTE — Patient Instructions (Addendum)
 Your provider has requested that you go to the basement level for lab work before leaving today. Press B on the elevator. The lab is located at the first door on the left as you exit the elevator.  You have been scheduled for an appointment with Alan Coombs PA-C on 12-20-24 at 1040am . Please arrive 10 minutes early for your appointment.   Please take your proton pump inhibitor medication, omeprazole  40 mg  Please take this medication 30 minutes to 1 hour before meals- this makes it more effective.  Avoid spicy and acidic foods Avoid fatty foods Limit your intake of coffee, tea, alcohol , and carbonated drinks Work to maintain a healthy weight Keep the head of the bed elevated at least 3 inches with blocks or a wedge pillow if you are having any nighttime symptoms Stay upright for 2 hours after eating Avoid meals and snacks three to four hours before bedtime  Gastroparesis Gastroparesis is a condition in which food takes longer than normal to empty from the stomach.  This condition is also known as delayed gastric emptying. It is usually a long-term (chronic) condition.  What are the signs or symptoms? Symptoms of this condition include: Feeling full after eating very little or a loss of appetite. Nausea, vomiting, or heartburn. Bloating of your abdomen. Inconsistent blood sugar (glucose) levels on blood tests. Unexplained weight loss. Acid from the stomach coming up into the esophagus (gastroesophageal reflux). Sudden tightening (spasm) of the stomach, which can be painful. Symptoms may come and go. Some people may not notice any symptoms.  What increases the risk? You are more likely to develop this condition if: You have certain disorders or diseases. These may include: An endocrine disorder. An eating disorder. Amyloidosis. Scleroderma. Parkinson's disease. Multiple sclerosis. Cancer or infection of the stomach or the vagus nerve. You have had surgery on your stomach or  vagus nerve. You take certain medicines. You are female.  Things you can do: Please do small frequent meals like 4-6 meals a day.  Eat and drink liquids at separate times.  Avoid high fiber foods, cook your vegetables, avoid high fat food.  Suggest spreading protein throughout the day (greek yogurt, glucerna, soft meat, milk, eggs) Choose soft foods that you can mash with a fork When you are more symptomatic, change to pureed foods foods and liquids.  Consider reading Living well with Gastroparesis by [facility] Check out this link to a diet online https://my.groupjournal.fr  Miralax  is an osmotic laxative.  It only brings more water into the stool.  This is safe to take daily.  Can take up to 17 gram of miralax  twice a day.  Mix with juice or coffee.  Start 1 capful for 3-4 days and reassess your response in 3-4 days.  You can increase and decrease the dose based on your response.  Remember, it can take up to 3-4 days to take effect OR for the effects to wear off.   I often pair this with benefiber in the morning to help assure the stool is not too loose.   FIBER SUPPLEMENT You can do metamucil or fibercon once or twice a day but if this causes gas/bloating please switch to Benefiber or Citracel.  Fiber is good for constipation/diarrhea/irritable bowel syndrome.  It can also help with weight loss and can help lower your bad cholesterol (LDL).  Please do 1 TBSP in the morning in water, coffee, or tea.  It can take up to a month before you can  see a difference with your bowel movements.  It is cheapest from [employer], [employer], [employer].   Toileting tips to help with your constipation - Drink at least 64-80 ounces of water/liquid per day. - Establish a time to try to move your bowels every day.  For many people, this is after a cup of coffee or after a meal such as breakfast. - Sit all of  the way back on the toilet keeping your back fairly straight and while sitting up, try to rest the tops of your forearms on your upper thighs.   - Raising your feet with a step stool/squatty potty can be helpful to improve the angle that allows your stool to pass through the rectum. - Relax the rectum feeling it bulge toward the toilet water.  If you feel your rectum raising toward your body, you are contracting rather than relaxing. - Breathe in and slowly exhale. Belly breath by expanding your belly towards your belly button. Keep belly expanded as you gently direct pressure down and back to the anus.  A low pitched GRRR sound can assist with increasing intra-abdominal pressure.  (Can also trying to blow on a pinwheel and make it move, this helps with the same belly breathing) - Repeat 3-4 times. If unsuccessful, contract the pelvic floor to restore normal tone and get off the toilet.  Avoid excessive straining. - To reduce excessive wiping by teaching your anus to normally contract, place hands on outer aspect of knees and resist knee movement outward.  Hold 5-10 second then place hands just inside of knees and resist inward movement of knees.  Hold 5 seconds.  Repeat a few times each way.  Go to the ER if unable to pass gas, severe AB pain, unable to hold down food, any shortness of breath of chest pain.  Your abdominal x-ray showed a significant amount of stool in your colon consistent with constipation.   Sometimes the degree of constipation a person is experiencing can be underappreciated.  This may be the case for you.  When individuals are constipated there can be considerable back pressure on the upper GI tract leading to nausea, bloating and fullness which may or may not be contributing to some of your symptoms.   There are a variety of different ways to manage this.  We can consider the use of over-the-counter laxatives such as Dulcolax, senna or MiraLAX .  Dulcolax and senna will increase  colon motility to help expel the bowel movements whereas MiraLAX  will simply soften the stool.  If you are stools are not excessively hard then it may be best to focus on senna or Dulcolax as options. We can also consider pelvic floor dysfunction as a contributing cause.   Alternatively, we could consider prescription options for treatment of constipation such as Linzess which also enhances colon motility or another medication called Trulance.  They are administered as a pill that is taken on a daily basis.  The main side effect is the possibility of diarrhea.  If you would be interested in trialing one of these we have samples in the office that we could provide you.    Please let me know your thoughts when you have a chance.

## 2024-10-23 ENCOUNTER — Ambulatory Visit: Admitting: Internal Medicine

## 2024-11-06 NOTE — Telephone Encounter (Signed)
 Inbound call from patient stating that she is returning a call back to Kaira. Patient is requesting a call back. Please advise.

## 2024-11-08 ENCOUNTER — Other Ambulatory Visit (INDEPENDENT_AMBULATORY_CARE_PROVIDER_SITE_OTHER)

## 2024-11-08 DIAGNOSIS — R748 Abnormal levels of other serum enzymes: Secondary | ICD-10-CM | POA: Diagnosis not present

## 2024-11-08 LAB — CBC
HCT: 39.5 % (ref 36.0–46.0)
Hemoglobin: 13 g/dL (ref 12.0–15.0)
MCHC: 33 g/dL (ref 30.0–36.0)
MCV: 93.2 fl (ref 78.0–100.0)
Platelets: 125 K/uL — ABNORMAL LOW (ref 150.0–400.0)
RBC: 4.24 Mil/uL (ref 3.87–5.11)
RDW: 15.5 % (ref 11.5–15.5)
WBC: 6.2 K/uL (ref 4.0–10.5)

## 2024-11-08 LAB — GAMMA GT: GGT: 137 U/L — ABNORMAL HIGH (ref 7–51)

## 2024-11-10 LAB — ALKALINE PHOSPHATASE ISOENZYMES
Alkaline phosphatase (APISO): 171 U/L — ABNORMAL HIGH (ref 37–153)
Bone Isoenzymes: 27 % — ABNORMAL LOW (ref 28–66)
Intestinal Isoenzymes: 13 % (ref 1–24)
Liver Isoenzymes: 61 % (ref 25–69)
Macrohepatic isoenzymes: 0 % (ref <=0)
Placental isoenzymes: 0 % (ref <=0)

## 2024-11-13 ENCOUNTER — Ambulatory Visit: Payer: Self-pay | Admitting: Physician Assistant

## 2024-11-24 ENCOUNTER — Other Ambulatory Visit: Payer: Self-pay | Admitting: Family

## 2024-11-27 ENCOUNTER — Ambulatory Visit: Admitting: Internal Medicine

## 2024-12-07 ENCOUNTER — Other Ambulatory Visit: Payer: Self-pay

## 2024-12-11 ENCOUNTER — Encounter: Payer: Self-pay | Admitting: Internal Medicine

## 2024-12-11 ENCOUNTER — Ambulatory Visit: Admitting: Internal Medicine

## 2024-12-11 VITALS — BP 138/78 | Ht 61.0 in | Wt 186.0 lb

## 2024-12-11 DIAGNOSIS — E1142 Type 2 diabetes mellitus with diabetic polyneuropathy: Secondary | ICD-10-CM

## 2024-12-11 DIAGNOSIS — Z794 Long term (current) use of insulin: Secondary | ICD-10-CM | POA: Diagnosis not present

## 2024-12-11 DIAGNOSIS — E1122 Type 2 diabetes mellitus with diabetic chronic kidney disease: Secondary | ICD-10-CM | POA: Diagnosis not present

## 2024-12-11 DIAGNOSIS — N1832 Chronic kidney disease, stage 3b: Secondary | ICD-10-CM | POA: Diagnosis not present

## 2024-12-11 DIAGNOSIS — Z7984 Long term (current) use of oral hypoglycemic drugs: Secondary | ICD-10-CM | POA: Diagnosis not present

## 2024-12-11 LAB — POCT GLYCOSYLATED HEMOGLOBIN (HGB A1C): Hemoglobin A1C: 7.1 % — AB (ref 4.0–5.6)

## 2024-12-11 NOTE — Progress Notes (Unsigned)
 "   Name: Donna Fuller  Age/ Sex: 84 y.o., female   MRN/ DOB: [id number], Feb 26, 1941     PCP: Dorina Dallas RIGGERS   Reason for Endocrinology Evaluation: Type 2 Diabetes Mellitus  Initial Endocrine Consultative Visit: 12/14/2019    PATIENT IDENTIFIER: Donna Fuller is a 84 y.o. female with a past medical history of T2DM, HTN, RA and Dyslipidemia. The patient has followed with Endocrinology clinic since 12/14/2019 for consultative assistance with management of her diabetes.  DIABETIC HISTORY:  Donna Fuller was diagnosed with T2DM many years ago. She was on metformin  due to low renal function, has been on Januvia , Farxia and insulin  as well. Her hemoglobin A1c has ranged from 6.0 %in 2015, peaking at 8.8% in 2020.  On her initial visit to our clinic her A1c was 8.8%   , she was on Trulicity  and pioglitazone , we added glipizide    Stopped Pioglitazone  03/2022 due to A1c 5.7 %    Trulicity  discontinued July, 2025 due to pancreatitis  She was prescribed basal insulin  through PCPs office in September, 2025 due to hyperglycemia  SUBJECTIVE:   During the last visit (08/11/2024): A1c 7.7%    Today (12/11/2024): Donna Fuller is here for follow-up on diabetes management. She checks her blood sugars multiple times daily through freestyle libre, she also uses glucose meter to check glucose.  No hypoglycemia  She was evaluated by GI in November, 2025 for constipation She follows with Dr. Monna for for seropositive rheumatoid arthritis  She follows with nephrology for CKD with proteinuria, but she has not had a follow-up in a while  She also follows with cardiology for CHF, cardiac amyloidosis    No recent glucocoids  Constipation is improving  Has occasional  nausea but no vomiting  She has been feeling more tired, no fever   HOME DIABETES REGIMEN:  Glipizide  10 mg ,2 tablets BID Jardiance  10 mg, 1 tablet daily through cardiology Lantus  10 units daily     CGM  Download:                    METER DOWNLOAD SUMMARY:  This a.m. 105 mg/dL    Range 04-781 mg/dL     DIABETIC COMPLICATIONS: Microvascular complications:  Neuropathy  Denies: retinopathy , CKD Last eye exam: Completed 09/18/2024  Macrovascular complications:    Denies: CAD, PVD, CVA     HISTORY:  Past Medical History:  Past Medical History:  Diagnosis Date   Arthritis    Breast cancer (HCC) 2007   Right breast, s/p mastectomy   Chronic diastolic congestive heart failure (HCC)    per patient, dx by cardiology   Diabetes mellitus without complication (HCC)    GERD (gastroesophageal reflux disease)    Gout    Hyperlipidemia    Hypertension    Spinal stenosis    Past Surgical History:  Past Surgical History:  Procedure Laterality Date   ABDOMINAL HYSTERECTOMY     ABDOMINAL SURGERY     BREAST BIOPSY Left    2016 benign    BREAST BIOPSY Left 02/16/2023   MM LT BREAST BX W LOC DEV 1ST LESION IMAGE BX SPEC STEREO GUIDE 02/16/2023 [facility] MAMMOGRAPHY   LEFT HEART CATH AND CORONARY ANGIOGRAPHY N/A 11/21/2021   Procedure: LEFT HEART CATH AND CORONARY ANGIOGRAPHY;  Surgeon: Darron Deatrice LABOR, MD;  Location: [facility] INVASIVE CV [facility];  Service: Cardiovascular;  Laterality: N/A;   MASTECTOMY     right side   REPLACEMENT TOTAL KNEE Bilateral  TUBAL LIGATION     Social History:  reports that she has quit smoking. Her smoking use included cigarettes. She has never been exposed to tobacco smoke. She has never used smokeless tobacco. She reports that she does not drink alcohol  and does not use drugs. Family History:  Family History  Problem Relation Age of Onset   Diabetes Mother    Alzheimer's disease Mother    Hypertension Mother    Hypertension Father    Heart disease Father    Hyperlipidemia Father    Kidney disease Father    Diabetes Sister    Cancer Brother        lung   Alzheimer's disease Brother    Heart disease Brother    Hypertension Brother     Diabetes Brother    Hypertension Brother    Diabetes Brother    Hyperlipidemia Son    Hypertension Son    Diabetes Son    Hypertension Daughter    Hyperlipidemia Daughter      HOME MEDICATIONS: Allergies as of 12/11/2024       Reactions   Penicillins Swelling   Sulfa Antibiotics Swelling   Amlodipine Swelling   LE Swelling   Lisinopril Cough   Trulicity  [dulaglutide ] Other (See Comments)   Pancreatitis        Medication List        Accurate as of December 11, 2024 12:18 PM. If you have any questions, ask your nurse or doctor.          acetaminophen  650 MG CR tablet Commonly known as: TYLENOL  Take 650 mg by mouth 2 (two) times daily.   albuterol  108 (90 Base) MCG/ACT inhaler Commonly known as: VENTOLIN  HFA INHALE 2 PUFFS BY MOUTH EVERY 6 HOURS AS NEEDED   Alcohol  Prep Pad 70 % Pads Use to check blood sugars   Amvuttra  25 MG/0.5ML syringe Generic drug: vutrisiran  sodium Inject 0.5 mLs (25 mg total) into the skin every 3 (three) months.   ascorbic acid 1000 MG tablet Commonly known as: VITAMIN C Take 1,000 mg by mouth daily.   aspirin  EC 81 MG tablet Take 1 tablet (81 mg total) by mouth daily. Swallow whole.   Azelastine HCl 137 MCG/SPRAY Soln SMARTSIG:1-2 Spray(s) Both Nares Twice Daily   B-12 PO Take 1 tablet by mouth daily.   blood glucose meter kit and supplies Kit Dispense based on patient and insurance preference. Use up to four times daily as directed. (FOR ICD-9 250.00, 250.01).   brimonidine  0.2 % ophthalmic solution Commonly known as: ALPHAGAN  Place 1 drop into both eyes 3 (three) times daily.   bumetanide  1 MG tablet Commonly known as: BUMEX  TAKE 1 TABLET BY MOUTH TWICE A DAY   dicyclomine  10 MG capsule Commonly known as: BENTYL  TAKE 1 TAB EVERY 6 HOURS AS NEEDED FOR ABDOMINAL CRAMPING.   ezetimibe  10 MG tablet Commonly known as: ZETIA  TAKE 1 TABLET BY MOUTH EVERY DAY   fluticasone  50 MCG/ACT nasal spray Commonly known as:  FLONASE  SPRAY 2 SPRAYS INTO EACH NOSTRIL EVERY DAY   FreeStyle Libre 3 Plus Sensor Misc 1 Device by Other route every 14 (fourteen) days. Change sensor every 15 days.   gabapentin  300 MG capsule Commonly known as: NEURONTIN  Take 1 capsule (300 mg total) by mouth 3 (three) times daily.   glipiZIDE  10 MG tablet Commonly known as: GLUCOTROL  Take 2 tablets (20 mg total) by mouth daily before breakfast AND 1 tablet (10 mg total) daily before supper.   insulin   glargine 100 UNIT/ML Solostar Pen Commonly known as: LANTUS  Take 5-10 units once daily   Jardiance  10 MG Tabs tablet Generic drug: empagliflozin  TAKE 1 TABLET BY MOUTH DAILY BEFORE BREAKFAST.   ketorolac  0.5 % ophthalmic solution Commonly known as: ACULAR  INSTILL 1 DROP INTO RIGHT EYE 4 TIMES A DAY   LANACANE EX Apply 1 application  topically daily as needed. Roll-on daily as needed for knee pain.   levocetirizine 5 MG tablet Commonly known as: XYZAL  TAKE 1 TABLET BY MOUTH EVERY DAY IN THE EVENING   losartan  25 MG tablet Commonly known as: COZAAR  Take 0.5 tablets (12.5 mg total) by mouth daily.   methylPREDNISolone  4 MG Tbpk tablet Commonly known as: MEDROL  DOSEPAK Use per package instructions   omeprazole  40 MG capsule Commonly known as: PRILOSEC Take 1 capsule (40 mg total) by mouth 2 (two) times daily before a meal.   ondansetron  4 MG disintegrating tablet Commonly known as: ZOFRAN -ODT Take 1 tablet (4 mg total) by mouth every 8 (eight) hours as needed for nausea or vomiting.   OneTouch Delica Lancets 33G Misc Use as instructed to check blood sugar 2-3 times a day.  DX E11.9   OneTouch Ultra Test test strip Generic drug: glucose blood CHECK BLOOD SUGAR 2 TO 3 TIMES DAILY AS DIRECTED   oxyCODONE -acetaminophen  5-325 MG tablet Commonly known as: Percocet Take 1 tablet by mouth every 6 (six) hours as needed for severe pain (pain score 7-10).   Pen Needles 32G X 4 MM Misc Use daily as directed    polyethylene glycol 17 g packet Commonly known as: MIRALAX  / GLYCOLAX  Take 17 g by mouth daily.   timolol  0.5 % ophthalmic solution Commonly known as: TIMOPTIC  Place 1 drop into both eyes 2 (two) times a day.   Vitamin A  2250 MCG (7500 UT) Caps Take 1 capsule by mouth daily. You can find this over the counter.   VITAMIN D-3 PO Take 2,000 Units by mouth daily.   Vyzulta 0.024 % Soln Generic drug: Latanoprostene Bunod Apply 1 drop to eye at bedtime.         OBJECTIVE:   Vital Signs: BP 138/78   Ht 5' 1 (1.549 m)   Wt 186 lb (84.4 kg)   BMI 35.14 kg/m   Wt Readings from Last 3 Encounters:  12/11/24 186 lb (84.4 kg)  10/20/24 183 lb (83 kg)  10/16/24 179 lb (81.2 kg)     Exam: General: Pt appears well and is in NAD  Lungs: Clear with good BS bilat   Heart: RRR   LE Trace edema bilaterally  Neuro: MS is good with appropriate affect, pt is alert and Ox3    DM foot exam:   04/20/2024  The skin of the feet is intact without sores or ulcerations. The pedal pulses are 1+ on right and 1+ on left. The sensation is intact to a screening 5.07, 10 gram monofilament bilaterally    DATA REVIEWED:  Lab Results  Component Value Date   HGBA1C 7.1 (A) 12/11/2024   HGBA1C 7.7 (A) 10/16/2024   HGBA1C 7.7 (A) 08/11/2024   Lab Results  Component Value Date   MICROALBUR 0.4 04/21/2017   LDLCALC 55 08/06/2023   CREATININE 1.71 (H) 10/20/2024    Latest Reference Range & Units 10/20/24 11:35  Sodium 135 - 145 mEq/L 140  Potassium 3.5 - 5.1 mEq/L 4.2  Chloride 96 - 112 mEq/L 100  CO2 19 - 32 mEq/L 31  Glucose 70 - 99 mg/dL  260 (H)  BUN 6 - 23 mg/dL 29 (H)  Creatinine 9.59 - 1.20 mg/dL 8.28 (H)  Calcium 8.4 - 10.5 mg/dL 9.0  Alkaline Phosphatase 39 - 117 U/L 190 (H)  Albumin 3.5 - 5.2 g/dL 3.9  Amylase, Serum 27 - 131 U/L 45  Lipase 11.0 - 59.0 U/L 34.0  AST 0 - 37 U/L 20  ALT 0 - 35 U/L 13  Total Protein 6.0 - 8.3 g/dL 7.0  Bilirubin, Direct 0.0 - 0.3 mg/dL 0.1   Total Bilirubin 0.2 - 1.2 mg/dL 0.7  GFR >39.99 mL/min 27.50 (L)      ASSESSMENT / PLAN / RECOMMENDATIONS:   1) Type 2 Diabetes Mellitus, Optimally controlled, With neuropathic and CKD IV complications - Most recent A1c of 7.1%. Goal A1c <7.5%.    -A1c is trending down -Her PCP started on basal insulin  in 2025, she used to be on insulin  in the past - Stopped  Pioglitazone  due to LE edema/CHF  - Intolerant to Farxiga  - rash  - Developed pancreatitis while on Trulicity  - Limited glycemic agents due to CKD - She is on Jardiance  through cardiology - No changes at this time  MEDICATIONS:  - Continue glipizide  10 mg, 2 tabs before breakfast and 2 tab before supper - Continue Lantus  10 units daily     EDUCATION / INSTRUCTIONS: BG monitoring instructions: Patient is instructed to check her blood sugars 2 times a day, fasting and suppertime. Call [facility] if: BG persistently < 70  I reviewed the Rule of 15 for the treatment of hypoglycemia in detail with the patient. Literature supplied.    2) Diabetic complications:  Eye: Does not have known diabetic retinopathy.  Neuro/ Feet: Does  have known diabetic peripheral neuropathy. Renal: Patient does not have known baseline CKD. She is  on an ACEI/ARB at present.    F/U in 4 months    In addition to time spent performing glucose monitor, I spent 25 minutes preparing to see the patient by review of recent labs, imaging and procedures, obtaining and reviewing separately obtained history, communicating with the patient, tests or procedures, and documenting clinical information in the EHR including the differential Dx, treatment, and any further evaluation and other management    Signed electronically by: Stefano Redgie Butts, MD  [facility] Endocrinology  [facility] [address] [place], [place] [address] Phone: [phone] FAX: [phone]   CC: Dorina Dallas RIGGERS [phone]  [facility] [address] [place] [address] Phone: [phone]  Fax: [phone]  Return to Endocrinology clinic as below: Future Appointments  Date Time Provider Department Center  12/20/2024 11:30 AM [facility] None  12/25/2024 10:40 AM Craig Alan SAUNDERS, PA-C [facility]  02/07/2025 11:00 AM Dolphus Reiter, MD [facility] None  04/17/2025  1:10 PM [facility] VISIT 2 [facility] [address]    "

## 2024-12-11 NOTE — Patient Instructions (Signed)
 Take glipizide  10 mg, TWO tablets before breakfast and TWO tablet before supper Take Lantus  10 units once daily     HOW TO TREAT LOW BLOOD SUGARS (Blood sugar LESS THAN 70 MG/DL) Please follow the RULE OF 15 for the treatment of hypoglycemia treatment (when your (blood sugars are less than 70 mg/dL)   STEP 1: Take 15 grams of carbohydrates when your blood sugar is low, which includes:  3-4 GLUCOSE TABS  OR 3-4 OZ OF JUICE OR REGULAR SODA OR ONE TUBE OF GLUCOSE GEL    STEP 2: RECHECK blood sugar in 15 MINUTES STEP 3: If your blood sugar is still low at the 15 minute recheck --> then, go back to STEP 1 and treat AGAIN with another 15 grams of carbohydrates.

## 2024-12-12 ENCOUNTER — Other Ambulatory Visit (HOSPITAL_COMMUNITY): Payer: Self-pay

## 2024-12-12 MED ORDER — PEN NEEDLES 32G X 4 MM MISC: 3 refills | Status: AC

## 2024-12-12 MED ORDER — GLIPIZIDE 10 MG PO TABS: 20.0000 mg | ORAL_TABLET | Freq: Every day | ORAL | 3 refills | Status: DC

## 2024-12-12 MED ORDER — INSULIN GLARGINE 100 UNIT/ML SOLOSTAR PEN: 10.0000 [IU] | PEN_INJECTOR | Freq: Every day | SUBCUTANEOUS | 2 refills | Status: AC

## 2024-12-13 ENCOUNTER — Other Ambulatory Visit (HOSPITAL_COMMUNITY): Payer: Self-pay

## 2024-12-14 ENCOUNTER — Other Ambulatory Visit (HOSPITAL_COMMUNITY): Payer: Self-pay

## 2024-12-14 ENCOUNTER — Other Ambulatory Visit: Payer: Self-pay

## 2024-12-14 ENCOUNTER — Telehealth: Payer: Self-pay

## 2024-12-14 NOTE — Telephone Encounter (Signed)
 Advanced Heart Failure Patient Advocate Encounter  The patient was approved for a [employer] grant that will help cover the cost of Amvuttra .  Total amount awarded, $8,000.  Effective: 11/14/2024 - 11/13/2025.  BIN [id number] PCN [id number] Group [id number] ID [id number]  Approval and processing information added to GAILA Rachel DEL, CPhT Rx Patient Advocate Phone: [phone]

## 2024-12-15 ENCOUNTER — Other Ambulatory Visit (HOSPITAL_COMMUNITY): Payer: Self-pay

## 2024-12-15 NOTE — Progress Notes (Unsigned)
 Specialty Pharmacy Refill Coordination Note  Donna Fuller is a 84 y.o. female assessed today regarding refills of clinic administered specialty medication(s) Vutrisiran  Sodium (Amvuttra )   Clinic requested Courier to Provider Office   Delivery date: 12/19/24   Verified address: [address], [place], [place] [address]   Medication will be filled on: 12/18/24

## 2024-12-19 ENCOUNTER — Other Ambulatory Visit: Payer: Self-pay

## 2024-12-19 ENCOUNTER — Telehealth: Payer: Self-pay | Admitting: Pharmacist

## 2024-12-19 NOTE — Progress Notes (Signed)
 Specialty Pharmacy Refill Coordination Note  Donna Fuller is a 85 y.o. female contacted today regarding refills of specialty medication(s) Vutrisiran  Sodium (Amvuttra )   Patient requested Courier to Provider Office   Delivery date: 12/19/24   Verified address: [address], [place] [address]   Medication will be filled on: 12/18/24

## 2024-12-19 NOTE — Telephone Encounter (Signed)
 Called to confirm/remind patient of their appointment at the Advanced Heart Failure Clinic on 12/20/24.   Appointment:   [x] Confirmed  [] Left mess   [] No answer/No voice mail  [] VM Full/unable to leave message  [] Phone not in service  Patient reminded to bring all medications and/or complete list.  Confirmed patient has transportation. Gave directions, instructed to utilize valet parking.

## 2024-12-20 ENCOUNTER — Other Ambulatory Visit
Admission: RE | Admit: 2024-12-20 | Discharge: 2024-12-20 | Disposition: A | Discharge status: Home / Self Care | Payer: Self-pay | Source: Ambulatory Visit | Attending: Cardiology | Admitting: Cardiology

## 2024-12-20 ENCOUNTER — Ambulatory Visit: Admitting: Physician Assistant

## 2024-12-20 ENCOUNTER — Ambulatory Visit (HOSPITAL_COMMUNITY): Payer: Self-pay | Admitting: Cardiology

## 2024-12-20 ENCOUNTER — Ambulatory Visit: Attending: Family | Admitting: Pharmacist

## 2024-12-20 VITALS — BP 128/72 | HR 72 | Wt 189.2 lb

## 2024-12-20 DIAGNOSIS — N189 Chronic kidney disease, unspecified: Secondary | ICD-10-CM | POA: Insufficient documentation

## 2024-12-20 DIAGNOSIS — I5032 Chronic diastolic (congestive) heart failure: Secondary | ICD-10-CM | POA: Insufficient documentation

## 2024-12-20 DIAGNOSIS — E1122 Type 2 diabetes mellitus with diabetic chronic kidney disease: Secondary | ICD-10-CM | POA: Insufficient documentation

## 2024-12-20 DIAGNOSIS — E854 Organ-limited amyloidosis: Secondary | ICD-10-CM | POA: Insufficient documentation

## 2024-12-20 DIAGNOSIS — I129 Hypertensive chronic kidney disease with stage 1 through stage 4 chronic kidney disease, or unspecified chronic kidney disease: Secondary | ICD-10-CM | POA: Insufficient documentation

## 2024-12-20 DIAGNOSIS — E785 Hyperlipidemia, unspecified: Secondary | ICD-10-CM | POA: Diagnosis not present

## 2024-12-20 DIAGNOSIS — I5022 Chronic systolic (congestive) heart failure: Secondary | ICD-10-CM | POA: Diagnosis not present

## 2024-12-20 DIAGNOSIS — E1140 Type 2 diabetes mellitus with diabetic neuropathy, unspecified: Secondary | ICD-10-CM | POA: Insufficient documentation

## 2024-12-20 DIAGNOSIS — E8582 Wild-type transthyretin-related (ATTR) amyloidosis: Secondary | ICD-10-CM | POA: Diagnosis not present

## 2024-12-20 DIAGNOSIS — I43 Cardiomyopathy in diseases classified elsewhere: Secondary | ICD-10-CM | POA: Diagnosis not present

## 2024-12-20 LAB — BASIC METABOLIC PANEL WITH GFR
Anion gap: 11 (ref 5–15)
BUN: 24 mg/dL — ABNORMAL HIGH (ref 8–23)
CO2: 34 mmol/L — ABNORMAL HIGH (ref 22–32)
Calcium: 9.8 mg/dL (ref 8.9–10.3)
Chloride: 98 mmol/L (ref 98–111)
Creatinine, Ser: 1.87 mg/dL — ABNORMAL HIGH (ref 0.44–1.00)
GFR, Estimated: 26 mL/min — ABNORMAL LOW
Glucose, Bld: 137 mg/dL — ABNORMAL HIGH (ref 70–99)
Potassium: 4 mmol/L (ref 3.5–5.1)
Sodium: 143 mmol/L (ref 135–145)

## 2024-12-20 LAB — PRO BRAIN NATRIURETIC PEPTIDE: Pro Brain Natriuretic Peptide: 4530 pg/mL — ABNORMAL HIGH

## 2024-12-20 MED ORDER — BUMETANIDE 1 MG PO TABS: 2.0000 mg | ORAL_TABLET | Freq: Two times a day (BID) | ORAL | 1 refills | Status: AC

## 2024-12-20 MED ORDER — VUTRISIRAN SODIUM 25 MG/0.5ML ~~LOC~~ SOSY
25.0000 mg | PREFILLED_SYRINGE | Freq: Once | SUBCUTANEOUS | Status: AC
  Administered 2024-12-20: 25 mg via SUBCUTANEOUS

## 2024-12-20 NOTE — Progress Notes (Deleted)
 " Advanced Heart Failure Clinic Note  PCP: Dorina Loving, PA-C PCP-Cardiologist: Evalene Lunger, MD HF-Cardiologist: Ria Commander, DO  HPI:  Donna Fuller is a 84 y.o. 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   Her cardiac history dates back to at least 2022 when she presented to [facility] with complaints of substernal chest pressure.  Patient underwent left heart catheterization that demonstrated only mild nonobstructive CAD of the LAD.  Echocardiogram with moderate to severe LVH and grade 2 diastolic dysfunction with LV ejection fraction of 60 to 65%.  Since that time patient reports she has had a continued decline in her functional status.  She was admitted to [facility] in July and December 2024 with acute on chronic HFpEF exacerbations.  In December 2024 she was diuresed with IV Lasix  and discharged home on torsemide  10 mg daily.  She reports having bilateral carpal tunnel syndrome, peripheral neuropathy and low back pain.     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     Seen by Dr. Commander on 12/15/23. Patient was doing much better after increasing diuretics, able to ambulate and complete ADLs easier. Carvedilol  was decreased to 3.125 mg BID   Seen by Dr. Commander on 01/12/24. Was hypervolemic and bumetanide  was increased to 2mg  BID.   PYP scan 01/2024 showed grade III uptake suggestive of cardiac amyloid.   Seen by Dr. Commander on 02/21/24. Bumex  decreased.   Seen by Dr. Commander on 05/31/24 and Bumex  was increased x 2 days and losartan  was reduced to 12.5 mg daily.  Seen by pharmacy for medication administration on 09/20/24. She reported hip pain that she believed was neuropathic. Ambulated with a cane, whereas, she used a wheel chair before starting Amvuttra .  Today Donna Fuller returns to Heart Failure Clinic for pharmacist medication  titration. Reports feeling not so good today. Reports dizziness lightheadedness fatigue SOB LEE orthopnea orthostasis PND. Denies chest pain, palpitations. Reports pain from knee to foot, pain feels like weights on them. Reports being able to complete all activities of daily living (ADLs), but activity is limited throughout the day. Reports she has not felt well the entire month of January due to fatigue and swelling. Takes bumetanide  2 mg every morning and 1 mg in the afternoon. Weight at home was 187 pounds this morning, reports it is usually is around 170 lbs. Appetite comes and goes, but she drinks Ensure when appetite is poor. Does follow a low sodium diet. Does not check BP at home. Does check sugar at home, fasting glucose 163 this morning.   Current Heart Failure Medications: Loop diuretic: bumetanide  2 mg in the morning and 1 mg in the afternoon Beta-Blocker: none ACEI/ARB/ARNI: losartan  12.5 mg daily  MRA: none SGLT2i: Jardiance  10 mg daily  Other: Amvuttra  25 mg SQ q3mo   Has the patient been experiencing any side effects to the medications prescribed? No  Does the patient have any problems obtaining medications due to transportation or finances? No  Understanding of regimen: Good  Understanding of indications: Good  Potential of adherence: Excellent  Patient understands to avoid NSAIDs.  Patient understands to avoid decongestants.  Pertinent Lab Values: Creat  Date Value Ref Range Status  06/18/2023 1.90 (H) 0.60 - 0.95 mg/dL Final   Creatinine, Ser  Date Value Ref Range Status  10/20/2024 1.71 (H) 0.40 - 1.20 mg/dL Final   BUN  Date Value Ref Range Status  10/20/2024 29 (H) 6 - 23 mg/dL Final  96/75/7974 36 (H) 8 - 27 mg/dL Final   Potassium  Date Value Ref Range Status  10/20/2024 4.2 3.5 - 5.1 mEq/L Final   Sodium  Date Value Ref Range Status  10/20/2024 140 135 - 145 mEq/L Final  02/21/2024 140 134 - 144 mmol/L Final   Brain Natriuretic Peptide  Date  Value Ref Range Status  06/18/2023 364 (H) <100 pg/mL Final    Comment:    . BNP levels increase with age in the general population with the highest values seen in individuals greater than 24 years of age. Reference: J. Am. Penne. Cardiol. 2002; 59:023-017. .    B Natriuretic Peptide  Date Value Ref Range Status  05/31/2024 441.8 (H) 0.0 - 100.0 pg/mL Final    Comment:    Performed at [facility], [address] [place], [place] [address]   Magnesium   Date Value Ref Range Status  06/29/2024 2.3 1.7 - 2.4 mg/dL Final    Comment:    Performed at [facility], [address] [place], [place] [address]   TSH  Date Value Ref Range Status  06/18/2023 1.69 0.40 - 4.50 mIU/L Final    Vital Signs: Today's Vitals   12/20/24 1154 12/20/24 1205  BP: 128/72   Pulse: 72   SpO2: 95%   Weight:  188 lb 9.6 oz (85.5 kg)    Assessment/Plan: Cardiac amyloid  - TTE with restrictive features; severe LVH and myocardium that is secondary to cardiac amyloidosis.  - Multiple myeloma panel negative. - PYP scan positive in February 2025. 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 - MD considering tafamidis since PYP scan was positive, however, doing well currently on Amvuttra  monotherapy. - Given Amvuttra  today for polyneuropathy and cardiac amyloid.     2. HFpEF, restrictive heart disease - Secondary to cardiac amyloid. - Patient is fluid overloaded on exam. ReDS study was poor. Increase to Bumex  4 mg in the morning and 2 mg in the afternoon for 3 days. Then decrease to Bumex  2 mg BID. BMET and NT-proBNP today. - MD considering starting finerenone at follow up. Prior authorization completed and approved. UACR 116 on 08/18/23. Copay is $0. - Continue Jardiance  10 mg daily.   3. Peripheral neuropathy - Reports mild sensory deficits in her fingers with carpal tunnel like symptoms.  - Her A1C has ranged from 6 to 6.8 since 2022. Now 7.1%. - Her  polyneuropathy is secondary to cardiac amyloid. Amvuttra  administered in clinic today [facility] [id number], LOT [id number], Exp 11/2026). Tolerated the injection well. Next injection in 3 months.   4. T2DM - A1c 7.1% on 12/11/24. - A1C from 6.8 to 6 ranging from 2022 to 2024. - Continue Lantus  10 units in the morning. Prescription for glipizide  20 mg daily, but reports taking glipizide  20 mg BID. Home fasting glucose 163 this morning, follow-up with PCP. - Continue Jardiance  10 mg daily. Patient reports recurrent yeast infections, with recent increase in A1c. Discussed benefit of SGLT2i in heart failure and CKD, and recurrent GUI will likely resolve when glucose is better controlled.    5. Hypertension  - BP 128/72 mmHg today. - Continue losartan  12.5 mg daily.   6. CKD -Labs 10/20/24: Scr 1.71, BUN 29, GFR 27.5, K 4.2   Follow up: With Dr. Rolan on 01/03/25 and with Pharmacy in 3 months for next injection.   Calton Nash, FORTNEY.FRIES PharmD Candidate "
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
## 2024-12-20 NOTE — Patient Instructions (Signed)
 It was a pleasure seeing you today!  MEDICATIONS: -Increase Bumex  (bumetanide ) to 4 mg (4 tablets) in the morning and 2 mg (2 tablets) in the afternoon for 3 days. Afterwards, decrease to 2 mg (2 tablets) every morning and evening -Call if you have questions about your medications.  LABS: -We will call you if your labs need attention.  NEXT APPOINTMENT: Return to clinic in 2 weeks to see Dr. Rolan.  In general, to take care of your heart failure: -Limit your fluid intake to 2 Liters (half-gallon) per day.   -Limit your salt intake to ideally 2-3 grams (2000-3000 mg) per day. -Weigh yourself daily and record, and bring that weight diary to your next appointment.  (Weight gain of 2-3 pounds in 1 day typically means fluid weight.) -The medications for your heart are to help your heart and help you live longer.   -Please contact us  before stopping any of your heart medications.  Call the clinic at [phone] with questions or to reschedule future appointments.

## 2024-12-21 ENCOUNTER — Telehealth: Payer: Self-pay | Admitting: Pharmacist

## 2024-12-21 NOTE — Telephone Encounter (Signed)
 Spoke with patient today. Symptoms are mildly improved and weight is trending down. Continue with current plan and will follow up via telephone early next week.

## 2024-12-25 ENCOUNTER — Ambulatory Visit: Admitting: Physician Assistant

## 2024-12-27 ENCOUNTER — Telehealth: Payer: Self-pay | Admitting: Pharmacist

## 2024-12-27 NOTE — Telephone Encounter (Signed)
 Patient reports feeling much improved and weight is close to baseline. Continue current diuretics.

## 2025-01-01 ENCOUNTER — Other Ambulatory Visit: Payer: Self-pay | Admitting: Internal Medicine

## 2025-01-03 ENCOUNTER — Ambulatory Visit: Admitting: Cardiology

## 2025-01-10 ENCOUNTER — Ambulatory Visit: Admitting: Physician Assistant

## 2025-01-12 ENCOUNTER — Ambulatory Visit: Admitting: Family

## 2025-02-01 ENCOUNTER — Ambulatory Visit: Admitting: Physician Assistant

## 2025-02-07 ENCOUNTER — Ambulatory Visit: Admitting: Rheumatology

## 2025-02-15 ENCOUNTER — Ambulatory Visit

## 2025-04-10 ENCOUNTER — Ambulatory Visit: Admitting: Internal Medicine

## 2025-04-17 ENCOUNTER — Ambulatory Visit
